# Patient Record
Sex: Male | Born: 1940 | Race: White | Hispanic: No | Marital: Married | Smoking: Former smoker
Health system: Southern US, Community
[De-identification: ages and names within clinical notes are randomized; demographics above are authoritative.]

## PROBLEM LIST (undated history)

## (undated) DIAGNOSIS — D696 Thrombocytopenia, unspecified: Secondary | ICD-10-CM

## (undated) DIAGNOSIS — E785 Hyperlipidemia, unspecified: Secondary | ICD-10-CM

## (undated) DIAGNOSIS — G500 Trigeminal neuralgia: Secondary | ICD-10-CM

## (undated) DIAGNOSIS — N183 Chronic kidney disease, stage 3 unspecified: Secondary | ICD-10-CM

## (undated) DIAGNOSIS — I480 Paroxysmal atrial fibrillation: Secondary | ICD-10-CM

## (undated) DIAGNOSIS — I739 Peripheral vascular disease, unspecified: Secondary | ICD-10-CM

## (undated) DIAGNOSIS — I428 Other cardiomyopathies: Secondary | ICD-10-CM

## (undated) DIAGNOSIS — I2510 Atherosclerotic heart disease of native coronary artery without angina pectoris: Secondary | ICD-10-CM

## (undated) DIAGNOSIS — I5042 Chronic combined systolic (congestive) and diastolic (congestive) heart failure: Secondary | ICD-10-CM

## (undated) DIAGNOSIS — G518 Other disorders of facial nerve: Secondary | ICD-10-CM

## (undated) DIAGNOSIS — K219 Gastro-esophageal reflux disease without esophagitis: Secondary | ICD-10-CM

## (undated) DIAGNOSIS — I10 Essential (primary) hypertension: Secondary | ICD-10-CM

## (undated) DIAGNOSIS — N179 Acute kidney failure, unspecified: Secondary | ICD-10-CM

## (undated) DIAGNOSIS — I493 Ventricular premature depolarization: Secondary | ICD-10-CM

## (undated) HISTORY — DX: Thrombocytopenia, unspecified: D69.6

## (undated) HISTORY — DX: Atherosclerotic heart disease of native coronary artery without angina pectoris: I25.10

## (undated) HISTORY — DX: Trigeminal neuralgia: G50.0

## (undated) HISTORY — DX: Hyperlipidemia, unspecified: E78.5

## (undated) HISTORY — DX: Ventricular premature depolarization: I49.3

## (undated) HISTORY — DX: Paroxysmal atrial fibrillation: I48.0

## (undated) HISTORY — DX: Gastro-esophageal reflux disease without esophagitis: K21.9

## (undated) HISTORY — PX: CARDIAC CATHETERIZATION: SHX172

## (undated) HISTORY — DX: Essential (primary) hypertension: I10

## (undated) HISTORY — DX: Chronic combined systolic (congestive) and diastolic (congestive) heart failure: I50.42

## (undated) HISTORY — DX: Peripheral vascular disease, unspecified: I73.9

---

## 2000-01-08 ENCOUNTER — Emergency Department (HOSPITAL_COMMUNITY): Admission: EM | Admit: 2000-01-08 | Discharge: 2000-01-08 | Payer: Self-pay | Admitting: *Deleted

## 2000-02-22 ENCOUNTER — Encounter (INDEPENDENT_AMBULATORY_CARE_PROVIDER_SITE_OTHER): Payer: Self-pay | Admitting: *Deleted

## 2000-02-22 ENCOUNTER — Ambulatory Visit (HOSPITAL_COMMUNITY): Admission: RE | Admit: 2000-02-22 | Discharge: 2000-02-22 | Payer: Self-pay | Admitting: Gastroenterology

## 2000-10-17 ENCOUNTER — Emergency Department (HOSPITAL_COMMUNITY): Admission: EM | Admit: 2000-10-17 | Discharge: 2000-10-17 | Payer: Self-pay | Admitting: *Deleted

## 2000-11-05 ENCOUNTER — Encounter: Payer: Self-pay | Admitting: Cardiology

## 2000-11-05 ENCOUNTER — Ambulatory Visit (HOSPITAL_COMMUNITY): Admission: RE | Admit: 2000-11-05 | Discharge: 2000-11-05 | Payer: Self-pay | Admitting: Cardiology

## 2001-05-07 ENCOUNTER — Ambulatory Visit (HOSPITAL_COMMUNITY): Admission: RE | Admit: 2001-05-07 | Discharge: 2001-05-07 | Payer: Self-pay | Admitting: Gastroenterology

## 2001-05-07 ENCOUNTER — Encounter: Payer: Self-pay | Admitting: Gastroenterology

## 2002-12-29 ENCOUNTER — Observation Stay (HOSPITAL_COMMUNITY): Admission: RE | Admit: 2002-12-29 | Discharge: 2002-12-30 | Payer: Self-pay | Admitting: Cardiology

## 2002-12-29 ENCOUNTER — Encounter: Payer: Self-pay | Admitting: Cardiology

## 2003-03-10 ENCOUNTER — Encounter: Payer: Self-pay | Admitting: Internal Medicine

## 2003-03-10 ENCOUNTER — Ambulatory Visit (HOSPITAL_COMMUNITY): Admission: RE | Admit: 2003-03-10 | Discharge: 2003-03-10 | Payer: Self-pay | Admitting: Internal Medicine

## 2003-06-10 ENCOUNTER — Ambulatory Visit (HOSPITAL_COMMUNITY): Admission: RE | Admit: 2003-06-10 | Discharge: 2003-06-10 | Payer: Self-pay | Admitting: Gastroenterology

## 2004-08-17 ENCOUNTER — Ambulatory Visit: Payer: Self-pay | Admitting: Cardiology

## 2004-09-04 ENCOUNTER — Ambulatory Visit: Payer: Self-pay

## 2004-09-20 ENCOUNTER — Encounter: Payer: Self-pay | Admitting: Cardiology

## 2004-09-20 ENCOUNTER — Ambulatory Visit: Payer: Self-pay

## 2005-08-15 ENCOUNTER — Ambulatory Visit: Payer: Self-pay | Admitting: Cardiology

## 2005-08-16 ENCOUNTER — Ambulatory Visit: Payer: Self-pay | Admitting: Cardiology

## 2005-08-16 ENCOUNTER — Encounter: Payer: Self-pay | Admitting: Cardiology

## 2005-10-17 ENCOUNTER — Emergency Department (HOSPITAL_COMMUNITY): Admission: EM | Admit: 2005-10-17 | Discharge: 2005-10-18 | Payer: Self-pay | Admitting: Emergency Medicine

## 2006-08-07 ENCOUNTER — Ambulatory Visit: Payer: Self-pay | Admitting: Cardiology

## 2007-08-04 ENCOUNTER — Ambulatory Visit: Payer: Self-pay | Admitting: Cardiology

## 2007-08-12 ENCOUNTER — Encounter: Payer: Self-pay | Admitting: Cardiology

## 2007-08-12 ENCOUNTER — Ambulatory Visit: Payer: Self-pay

## 2007-08-25 ENCOUNTER — Ambulatory Visit: Payer: Self-pay | Admitting: Cardiology

## 2007-08-25 ENCOUNTER — Inpatient Hospital Stay (HOSPITAL_COMMUNITY): Admission: EM | Admit: 2007-08-25 | Discharge: 2007-08-31 | Payer: Self-pay | Admitting: Emergency Medicine

## 2007-09-28 ENCOUNTER — Ambulatory Visit: Payer: Self-pay | Admitting: Cardiology

## 2008-09-08 ENCOUNTER — Telehealth: Payer: Self-pay | Admitting: Cardiology

## 2008-10-11 DIAGNOSIS — E785 Hyperlipidemia, unspecified: Secondary | ICD-10-CM | POA: Insufficient documentation

## 2008-10-12 ENCOUNTER — Ambulatory Visit: Payer: Self-pay | Admitting: Cardiology

## 2008-10-20 ENCOUNTER — Encounter: Payer: Self-pay | Admitting: Cardiology

## 2008-10-20 ENCOUNTER — Ambulatory Visit: Payer: Self-pay

## 2008-12-16 ENCOUNTER — Encounter: Payer: Self-pay | Admitting: Cardiology

## 2009-03-09 DIAGNOSIS — N401 Enlarged prostate with lower urinary tract symptoms: Secondary | ICD-10-CM | POA: Insufficient documentation

## 2009-03-09 DIAGNOSIS — E7800 Pure hypercholesterolemia, unspecified: Secondary | ICD-10-CM | POA: Insufficient documentation

## 2009-03-09 DIAGNOSIS — E1121 Type 2 diabetes mellitus with diabetic nephropathy: Secondary | ICD-10-CM | POA: Insufficient documentation

## 2009-05-27 HISTORY — PX: SHOULDER SURGERY: SHX246

## 2010-08-09 ENCOUNTER — Telehealth: Payer: Self-pay | Admitting: Cardiology

## 2010-08-14 NOTE — Progress Notes (Signed)
Summary: pt needs refill  Phone Note Refill Request Message from:  Patient  Refills Requested: Medication #1:  METOPROLOL TARTRATE 50 MG TABS 11/2 tab twice a day Initial call taken by: Shelda Pal,  August 09, 2010 4:46 PM    Prescriptions: METOPROLOL TARTRATE 50 MG TABS (METOPROLOL TARTRATE) 11/2 tab twice a day  #270 Tablet x 0   Entered by:   Margaretmary Bayley CMA   Authorized by:   Renella Cunas, MD, [facility]   Signed by:   Margaretmary Bayley CMA on 08/10/2010   Method used:   Electronically to        [facility] [phone] (retail)       [address]       [address]       [place], [place]  [address]       Ph: [phone]       Fax: [phone]   [place]:   [phone]

## 2010-08-16 ENCOUNTER — Encounter: Payer: Self-pay | Admitting: Cardiology

## 2010-08-29 ENCOUNTER — Encounter: Payer: Self-pay | Admitting: Cardiology

## 2010-08-29 ENCOUNTER — Ambulatory Visit (INDEPENDENT_AMBULATORY_CARE_PROVIDER_SITE_OTHER): Payer: Medicare Other | Admitting: Cardiology

## 2010-08-29 VITALS — BP 122/72 | HR 76 | Resp 18 | Ht 71.0 in | Wt 214.8 lb

## 2010-08-29 DIAGNOSIS — I2510 Atherosclerotic heart disease of native coronary artery without angina pectoris: Secondary | ICD-10-CM

## 2010-08-29 DIAGNOSIS — I428 Other cardiomyopathies: Secondary | ICD-10-CM

## 2010-08-29 NOTE — Progress Notes (Signed)
  Subjective:    Patient ID: MAIZE RECKARD, male    DOB: 07-15-1940, 70 y.o.   MRN: [id number]  HPI Mr Moreaux comes today to establish with me as his cardiologist. 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 His last echocardiogrm shoed improvement with an EF of 55% and mild dilatation. He is compliant with his meds. He denies angina, significant DOE, no orthopnea or edema.   Review of Systems  All other systems reviewed and are negative.       Objective:   Physical Exam  Constitutional: He is oriented to person, place, and time. He appears well-developed and well-nourished. No distress.  HENT:  Head: Atraumatic.  Eyes: EOM are normal. Pupils are equal, round, and reactive to light.  Neck: Normal range of motion. No JVD present. No tracheal deviation present. No thyromegaly present.       No bruits  Cardiovascular: Normal rate, regular rhythm and intact distal pulses.  Exam reveals gallop.   No murmur heard. Pulmonary/Chest: Effort normal.  Abdominal: Soft. Bowel sounds are normal. He exhibits no mass.       No bruits  Musculoskeletal: He exhibits no edema.  Neurological: He is alert and oriented to person, place, and time.  Skin: Skin is warm and dry.  Psychiatric: He has a normal mood and affect.          Assessment & Plan:

## 2010-08-29 NOTE — Assessment & Plan Note (Signed)
Nonobstructive and asx. I reviewed ACS and angina and how to respond.

## 2010-08-29 NOTE — Assessment & Plan Note (Signed)
EF has improved. Pt and wife reassured.

## 2010-08-29 NOTE — Patient Instructions (Signed)
Your physician recommends that you schedule a follow-up appointment in: 1 year with Dr. Verl Blalock

## 2010-10-09 NOTE — Discharge Summary (Signed)
NAME:  Erik [name], Erik [name] NO.:  [id number]   MEDICAL RECORD NO.:  [id number]          PATIENT TYPE:  INP   LOCATION:  5008                         FACILITY:  [facility]   PHYSICIAN:  Jari Pigg, PA       DATE OF BIRTH:  [birth year]   DATE OF ADMISSION:  08/25/2007  DATE OF DISCHARGE:  08/31/2007                               DISCHARGE SUMMARY   DISCHARGE DIAGNOSES:  1. Severely comminuted proximal humerus fracture involving the neck of      the left humerus.  2. Suspected acute prostatitis.   ADDITIONAL DISCHARGE DIAGNOSES:  1. Diabetes type 2.  2. Hypertension.  3. Congestive heart failure.  4. Benign prostatic hypertrophy.  5. Hyperlipidemia.  6. Coronary artery disease.  7. Gastroesophageal reflux disease.   Procedure was performed on August 28, 2007.  Left shoulder  hemiarthroplasty using Biomet cemented stem of 54 radius head with 22 mm  depth.   BRIEF HISTORY AND HOSPITAL COURSE:  Erik [name] is a right-hand-  dominant, 70 year old, Caucasian male with extensive past medical  history who presented to the emergency department on August 26, 2007 with  primary complaint of left shoulder pain, which was found to be due to  left proximal humerus fracture after falling off a ladder into the  gutters on the afternoon of August 26, 2007.  The patient was seen and  evaluated in the emergency department by the Orthopedic Trauma Service,  where closed reduction was attempted.  However, given the significant  amount of displacement and the fracture pattern, it was decided that  operative intervention was necessary to achieve anatomic reduction.  However, based on CT scan that was later obtained, demonstrated that  there was a severe comminution of the left proximal humerus, which  extended into the anatomic neck.  Therefore, it was decided that it  would be in the patient's best interest to proceed with a  hemiarthroplasty of his left shoulder.  The patient was taken to  the  operating room on August 26, 2007, where he underwent a left shoulder  hemiarthroplasty.  The patient tolerated the procedure extremely well  and was brought to the PACU without any complications.  The patient was  then transferred up to the floor where his hospital stay was 3 days at  length.   Given the patient's extensive medical history, we felt it necessary to  obtain consults from his primary care doctor who is Dr. Domenick Gong  and Dr. Vanna Scotland. Olevia Perches who is Erik [name]'s cardiologist.  With this in  mind, we delineated surgery until we achieved perfect clearance from  these 2 physicians.  The patient received clearance, and we brought the  patient to the operating room on August 28, 2007.  As noted above, the  patient tolerated the procedure well and during his postop day #2, he  did experience slight increase in his BUN and creatinine.  However, this  minor elevation was determined that it was not significant, and the  patient was observed with encouragement of fluids and monitoring of his  urine output.  The  patient also had what appeared to be either a urinary  tract infection or possible prostatitis.  Before this, the patient was  given a prescription for ciprofloxacin 250 mg p.o. b.i.d.  On  postoperative day #3, the patient was deemed stable both from a pain  perspective and vital signs in addition to negative urinary analysis and  was discharged to home.   PHYSICAL EXAMINATION:  The patient is doing extremely well and would  like to go home.  He has no new complaints at this time.  Pain is well-  controlled.  The patient denies any chest pain or shortness of breath.  No nausea or vomiting or diarrhea.  No abdominal pain.  No dizziness or  lightheadedness is noted.  VITALS:  97.8, pulse 66, respirations 18, BP 125/69, O2 sat 98% room  air.  LUNGS:  CTA bilaterally.  CARDIAC:  Regular rate and rhythm.  ABDOMEN: Soft, nontender, and nondistended with positive bowel  sounds.  EXTREMITIES: Left upper extremity dressing is clean, dry and intact.  Distal sensory function along the radial, median, ulnar and axillary  nerves is intact to light touch.  Motor function is intact along the  radial, ulnar, and median nerves.  PIN and AIN motor function is also  intact.  There is minimal tenderness with palpation of the left upper  extremity.  It is soft to palpation.  There is 2+ radial pulse.  Distal  skin is appropriate in color and temperature.   Discharge CBC, white blood cells 7.4, hemoglobin 8.4, hematocrit 24.5,  platelets 209.  Basic metabolic panel on August 30, 2007, sodium 134,  potassium 4.7, chloride 98, CO2 27, glucose 173, BUN 33, creatinine 2.2,  calcium 8.1.  Urine culture finalized on August 30, 2007 demonstrated no  growth.  In general, the patient has no acute distress.  He is resting  comfortably, and he is eager to go home.   ADDITIONAL LABS:  Postoperative x-ray demonstrated a located left  shoulder hemiarthroplasty.   DISCHARGE MEDICATIONS:  As follows.  1. Percocet 5/325 one to two every 4-6 hours as needed for pain.  2. Oxycodone 15 mg one-half tab every 6 hours for breakthrough pain in      between Percocet doses.  3. Robaxin 500 mg 1-2 every 6 hours.  4. Flomax 0.4 mg p.o. daily.  5. Lovenox 40 mg one subcu injection daily x14 days.  6. Ciprofloxacin 250 mg p.o. b.i.d. x14 days.   The patient may resume home medications as follows.  1. Nexium 40 mg p.o. daily.  2. Enalapril 5 mg p.o. b.i.d.  3. Imipramine 50 mg p.o. at bedtime.  4. Vitamin E 400 mg p.o. daily.  5. Folic acid A999333 mcg p.o. daily.  6. Metoprolol 50 mg one and half tablet p.o. b.i.d.  7. Metformin 850 mg p.o. daily.  8. Glimepiride 2 mg p.o. daily.  9. Pioglitazone 15 mg p.o. daily.  10.Crestor 20 mg p.o. daily.  11.HCTZ 25 mg daily.  12.The patient may resume aspirin 81 mg p.o. daily.   DISCHARGE INSTRUCTIONS:  As follows.  Erik [name] will be   nonweightbearing on his left upper extremity.  However, he may begin  gentle passive motion of the shoulder as well as tendons at this point  in time as pain allows.  He is to wear sling for comfort.  He is  permitted to do unrestricted range of motion of his left elbow, wrist  and hand as this may help control some  of the swelling.  It is  imperative that they perform daily dressing changes.  Once the wound is  completely dry, he may shower and wash with soap and water only.  The  patient has been advised not to apply any ointments to the wound such as  hydrogen peroxide, rubbing alcohol, iodine, or any antibiotic ointments.  Erik [name] will avoid active motion for the next 6-8 weeks, but as  described above, may begin gentle passive motion with the assistance of  therapy.  Erik [name] will also be placed on Lovenox for DVT prophylaxis  for the next 14 days given his extensive cardiac history.  Even though  this was not a lower extremity injury, his past history does warrant  some DVT prophylaxis.  Therefore, we feel that 14 days of Lovenox  therapy is sufficient for this purpose.  Erik [name] can be as active as  he can tolerate within pain limit and with the restrictions I have  placed on his left upper extremity.  Erik [name] will follow up with Dr.  Marcelino Scot in 10-14 days at which time we will obtain x-rays of his left  shoulder to evaluate the placement of the prosthesis and to assess  healing.  The patient will also follow up with Dr. Olevia Perches on Sep 28, 2007  and will also follow up with Dr. Osborne Casco in 2 weeks.      Jari Pigg, PA     KWP/MEDQ  D:  09/27/2007  T:  09/28/2007  Job:  [id number]

## 2010-10-09 NOTE — Assessment & Plan Note (Signed)
[facility]                            CARDIOLOGY OFFICE NOTE   TILFORD, MOLLINEDO                      MRN:          [id number]  DATE:08/04/2007                            DOB:          [birth year]    PRIMARY CARE PHYSICIAN:  [name], M.D.   CLINICAL HISTORY:  Mr. Zuo is 70 years old and is now retired from  Architectural technologist at [employer].  He returns for followup  management of his nonischemic cardiomyopathy and PVCs.  He had a  catheterization which showed nonobstructive coronary disease and his  ejection fraction has been documented in the range of 30-35% although it  had improved to 40-45% on echo a year ago.  He says he has been doing  quite well and has had no recent chest pain, shortness of breath or  palpitations.   PAST MEDICAL HISTORY:  Significant for hypertension, diabetes, GERD and  hyperlipidemia.   CURRENT MEDICATIONS:  1. Include Nexium.  2. Glimepiride.  3. Hydrochlorothiazide 25 mg daily.  4. Enalapril 5 mg b.i.d.  5. Imipramine.  6. Aspirin 81 mg daily.  7. Vitamin E.  8. Folic acid.  9. CoQ10.  10.Metformin p.o.  11.Pioglitazone.  12.Metoprolol 50 mg 1/2 tablet b.i.d.  13.Crestor 20 mg daily.   PHYSICAL EXAMINATION:  VITAL SIGNS:  On examination the blood pressure  is 120/74 and the pulse 74 and regular.  NECK:  There was no venous distention.  The carotid pulses were full  without bruits.  CHEST:  Was clear.  HEART:  His heart rhythm was regular.  I could hear no murmurs or  gallops.  ABDOMEN:  Soft without organomegaly.  EXTREMITIES:  Peripheral pulses were full and there was no peripheral  edema.   Electrocardiogram showed sinus rhythm with one PVC and was otherwise  normal.   IMPRESSION:  1. 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  2. Nonobstructive coronary disease.  3. Hypertension.  4. Diabetes.  5. Gastroesophageal reflux disease.  6. Premature  ventricular contractions.  7. Hyperlipidemia.   RECOMMENDATIONS:  I think Erik [name] is doing quite well.  We will  continue his current medication list which includes a beta-blocker and  ACE inhibitor for his LV dysfunction.  We will get a repeat  echocardiogram and we will see him back in followup in a year.     Bruce Alfonso Patten Olevia Perches, MD, [facility]  Electronically Signed    BRB/MedQ  DD: 08/04/2007  DT: 08/05/2007  Job #: [id number]

## 2010-10-09 NOTE — Op Note (Signed)
NAME:  Erik [name], DAY NO.:  [id number]   MEDICAL RECORD NO.:  [id number]          PATIENT TYPE:  INP   LOCATION:  5008                         FACILITY:  [facility]   PHYSICIAN:  Astrid Divine. Marcelino Scot, M.D. DATE OF BIRTH:  12/01/1940   DATE OF PROCEDURE:  08/28/2007  DATE OF DISCHARGE:                               OPERATIVE REPORT   PREOPERATIVE DIAGNOSIS:  Severely comminuted proximal humerus fracture  involving the anatomic neck, left.   POSTOPERATIVE DIAGNOSIS:  Severely comminuted proximal humerus fracture  involving the anatomic neck, left.   PROCEDURE:  Left shoulder hemiarthroplasty using a Biomet cemented 8  stem, 54 radius head with a 22 mm depth.   SURGEON:  Astrid Divine. Marcelino Scot, M.D.   ASSISTANT:  Jari Pigg, PA   ANESTHESIA:  General.   COMPLICATIONS:  None.   ESTIMATED BLOOD LOSS:  200 mL.   IV FLUIDS:  2000 crystalloid, 500 Hespan.   DISPOSITION:  To PACU.   CONDITION:  Stable.   BRIEF SUMMARY/INDICATIONS FOR PROCEDURE:  Mr. Phommachanh is a right-hand  dominant 70 year old male diabetic with cardiac problems as well.  He  sustained a severely comminuted left proximal humerus fracture with  involvement of the anatomic neck as confirmed on CT scan.  We discussed  with him preoperatively the risks and benefits of surgery; including the  possibility of infection, nerve injury, vessel injury, DVT, PE, heart  attack, stroke, and others.  After full discussion he wished to proceed.   BRIEF SUMMARY OF PROCEDURE:  Mr. Olund was taken to the operating  room, after administration of preoperative antibiotics, general  anesthesia was induced.  Left upper extremity was prepped and draped in  the usual sterile fashion.  We made a standard deltopectoral approach,  identified the cephalic vein, retracted it laterally with the deltoid,  and developed the deltopectoral interval.  We incised the clavipectoral  fascia.  There was considerable hematoma.  There  appeared to be a  massive injury to the deltoid, and also some of the pec which was  contused and part of the pec insertion was disrupted as well.  The  muscle was nonetheless contractile on the side of the deltoid.  We  identified the biceps tendon using osteotome to complete the lesser  tuberosity segment.  We did leave the rotator cuff attached to this.   Similarly we used #2 FiberWire to grasp both the greater tuberosity and  lesser tuberosity pieces along with their respective rotator cuff  insertions.  As his bone was of good quality, we used a drill to assist  with passage of the suture needles.  We used a rongeur to hollow this  area out.  We then examined the glenoid and there was some grade 3 wear,  but no areas of grade 4 eburnation of the bone.  We also debrided a  degenerative tear of the labrum.  There was some fraying along the  labrum inferiorly, but none of these segments required debridement as  they did not displace into the joint.  We removed all the head fragments  including the anatomic  neck which was measured to the dimensions stated  above.   We then prepared the humeral shaft using the reamers, then the broach  for placement of the plastic cuff.  This was inserted and secured into  place.  We then trialed the prosthesis for height.  This was followed by  placement of the actual prosthesis after using standard third-generation  cement technique along with the cement restricter, and pressurization.  We placed the head in approximately 30 degrees of retroversion.  The  excess cement was removed.  We irrigated thoroughly.  We repaired the  tuberosities back through bone tunnels placed into the proximal radial  shaft as well as through the prosthesis.  The tuberosities were also  tied to each other, and through the prosthesis as well as, such that  they moved as a unit.  As we had large pieces of tuberosity, we did not  need additional grafting, though we had  prepared to do so.  We had  excellent bone-to-bone contact.  Wounds were copiously irrigated and  closed in a standard layered fashion using #0 Vicryl to reapproximate  the deltopectoral interval, and 2-0 Vicryl for the subcu, and staples  for the skin.  A sterile gently compressive dressing was then applied  and then a sling.  The patient was awakened from anesthesia and  transported back to PACU, in stable condition, after placement of a  Foley catheter.   PROGNOSIS:  Mr. Billadeau has had a severe injury to his left shoulder,  proximal humeral replacement should facilitate pain control and limited  function for every day activities.  He can begin a gentle passive motion  of the shoulder as well as pendulum, but will avoid active motion for 6-  8 weeks.      Astrid Divine. Marcelino Scot, M.D.  Electronically Signed     MHH/MEDQ  D:  08/28/2007  T:  08/28/2007  Job:  [id number]

## 2010-10-09 NOTE — Assessment & Plan Note (Signed)
[facility]                            CARDIOLOGY OFFICE NOTE   JOREY, JAHODA                      MRN:          [id number]  DATE:09/28/2007                            DOB:          1941-04-04    PRIMARY CARE PHYSICIAN:  [name], M.D.   CLINICAL HISTORY:  Mr. Kubik is 70 years old and returned for a follow-  up visit early after his recent hospitalization following a fall.  He  was working on his house and fell and had a severe dislocation and  fraction of his left shoulder and humorous.  He required surgery for  this and is currently in rehab.   He has a nonischemic cardiomyopathy with ejection fraction of 35-45% by  recent echocardiography.  He has a history of PVCs and nonobstructive  coronary disease at catheterization.   He says he has done well from the standpoint of his heart with no recent  chest pain, shortness of breath or palpitations.   PAST MEDICAL HISTORY:  Significant for  1. Hypertension.  2. Diabetes  3. Hyperlipidemia.   CURRENT MEDICATIONS:  1. Metformin/pioglitazone.  2. Metoprolol 50 mg 1-1/2 tablet b.i.d.  3. Crestor 20 mg daily.  4. Aspirin 81 mg daily.  5. Imipramine 50 mg daily.  6. Enalapril 5 mg b.i.d.  7. Hydrochlorothiazide 25 mg daily.  8. Glimepiride.  9. Nexium.   PHYSICAL EXAMINATION:  VITAL SIGNS:  Blood pressure 128/70, pulse 68 and  regular.  NECK:  There was no venous distention.  The carotid pulses were full  without bruits.  CHEST:  Clear without rales or rhonchi.  CARDIAC:  Rhythm was regular.  Heart sounds were normal and there were  no murmurs or gallops.  ABDOMEN:  Soft with normal bowel sounds.  EXTREMITIES:  Peripheral pulses were full, no peripheral edema.   IMPRESSION:  1. Recent fall with dislocation and fracture of the left shoulder.  2. Nonischemic cardiomyopathy, ejection fraction of 35-45%.  3. PVCs.  4. Nonobstructive coronary disease.  5. Hyperlipidemia.  6. Hypertension.  7. Diabetes  8. Gastroesophageal reflux disease.   RECOMMENDATIONS:  I think Mr. Terhaar is doing well from the standpoint  of his heart.  He is currently in rehab for his shoulder.  We will plan  to see him back in follow-up in a year.     Bruce Alfonso Patten Olevia Perches, MD, [facility]  Electronically Signed    BRB/MedQ  DD: 09/28/2007  DT: 09/28/2007  Job #: [id number]

## 2010-10-09 NOTE — Consult Note (Signed)
NAME:  Erik [name], Erik [name] NO.:  [id number]   MEDICAL RECORD NO.:  [id number]          PATIENT TYPE:  INP   LOCATION:  5008                         FACILITY:  [place]   PHYSICIAN:  [name], M.D.DATE OF BIRTH:  06/08/1940   DATE OF CONSULTATION:  08/26/2007  DATE OF DISCHARGE:                                 CONSULTATION   CHIEF COMPLAINT:  Status post fall off ladder with left humeral  fracture, medical clearance/diabetes management/elevated potassium and  creatinine to be addressed.   HISTORY OF PRESENT ILLNESS:  The patient is a 70 year old, white male,  who is well-known to me from a history of well-controlled diabetes with  a last A1c of 6.4, hypertension, mild congestive heart failure with an  EF of 35 to 45% on last set of imaging, BPH, hyperlipidemia, and reflux  disease, who was working on patching some of his gutters in the past  day.  He evidently fell off his ladder and had a considerable fall and  was taken by EMS and was admitted by Dr. Marcelino Scot after having trauma.  He  had his left shoulder and arm reset as he had a displacement and  requires an open procedure to be done in the OR tomorrow.  Of note, at  the time of his admission, his creatinine was 1.6.  Our old records  indicate that that is usually where he is at.  His creatinine has risen  to 1.8 today, and his potassium was noted as being elevated as well.  The patient, other than having fallen, feels to be in his normal state  of health.  He has missed a couple of his doses of medications because  of the hospitalization, but otherwise indicates that he was in his  normal state of doing things prior to his fall.   ALLERGIES:  The patient has had problems with ANTIHISTAMINES and urinary  retention.   MEDICATIONS:  1. Hydrochlorothiazide 25 mg p.o. daily.  2. Aspirin 81 mg per day.  3. Nexium 40 mg daily.  4. Enalapril 5 mg b.i.d.  5. Toprol-XL 50 mg p.o. b.i.d.  6. Imipramine 150 mg  p.o. q.h.s.  7. Amaryl 4 mg p.o. daily.  8. Crestor 20 mg p.o. daily.  9. Actoplus Met 15/850 twice daily.   PAST MEDICAL HISTORY:  1. Diabetes mellitus, type 2, with a history of microalbuminuria.  He      has been well-controlled with an A1c of 6.4 his last visit.  2. Hypertension.  3. Congestive heart failure with an overestimated low ejection      fraction most recently at 35 to 45% and is mild, he has not shown      outward symptoms at this point.  4. BPH with INTOLERANCE TO ANTIHISTAMINES CAUSING OBSTRUCTION.  5. Hyperlipidemia.  6. Coronary artery disease.  7. Gastroesophageal reflux disease.   SOCIAL HISTORY:  The patient lives in [place] with his wife, Parke Simmers,  who is also my patient.  He is married with three children.  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   FAMILY HISTORY:  Mother died at age 64 of diabetes and heart disease,  father died at age 8 of heart disease, he has a brother, who died of  heart disease, and another brother, who is alive being diabetic.   REVIEW OF SYSTEMS:  The patient denies any fevers, chills, sweats, any  headaches, indicates that he was in his normal state of health prior to  falling, and is otherwise negative except for myalgias, arthralgias,  swelling in his left shoulder, and some stitches and bruising around his  chin.  A 10-point review is otherwise negative.   ADVANCED DIRECTIVES:  Patient is a Full Code.   PHYSICAL EXAMINATION:  VITAL SIGNS:  Temperature 97.6, pulse 96,  respiratory rate 20, blood pressure 110/61, saturation 95% on room air.  GENERAL:  The patient is traumat status post fall and appears to have  suffered a trauma but is in no acute distress presently.  HEENT:  Normocephalic, extraocular movements intact, TM's are clear, he  has a laceration of his chin, which has been sutured, he has some  bruising below his chin.  NECK:  Supple, no lymphadenopathy.   HEART:  Regular rate and rhythm, no murmur, rub, or gallop.  LUNGS:  Clear to auscultation bilaterally.  ABDOMEN:  Soft and nontender with normoactive bowel sounds.  EXTREMITIES:  The patient has swelling in his left shoulder with  considerable bruising, he has decreased range of motion consistent with  his fracture.  MUSCULOSKELETAL:  As noted above.  NEURO:  The patient is oriented to person, place, and time.  He has  normal sensation in both his upper and lower extremities and 1+ deep  tendon reflexes bilaterally.   LABORATORY DATA:  His white count is slightly elevated at 11.1,  hemoglobin 14.2, his hematocrit 40.9, platelets 171, BUN and creatinine  are slightly elevated at 23 and 1.8 respectively, his glucose is 229  with a potassium of 5.5, which is slightly elevated.  Coags are  negative.  Urinalysis showed an elevated glucose, pH of 5, and a  specific gravity of 1.023.   ASSESSMENT AND PLAN:  1. Chronic kidney disease:  The patient has been assessed in the past      with a 24-hour creatinine clearance indicating that for a person      his size his clearance is approximately 40 to 50 at his baseline.      This is why he is able to maintain Metformin without any      difficulty.  We have looked into this in the past and preferred to      use medications that do not cause hypoglycemia as he has been well      controlled.  Given the rise in his creatinine, we are, however,      going to take him off of his Metformin and continue to follow him      with sliding scale.  His Amaryl will be held pending tomorrow's      surgery as he will be NPO, and we will expand his access to 15 mg      twice daily given that he has a very mild decrease in ejection      fraction and there should not be any difficulty with congestive      heart failure.  2. Hyperkalemia:  This is most likely elevated secondary to his      glucoses being elevated as well.  As  glucose comes down, potassium       comes down as well, and I do not feel that this is a feature of      renal insufficiency.  If his potassium rises further overnight, we      will consider taking him off the Enalapril.  However, I also find      him to be somewhat dehydrated and will start him on normal saline      at 75 ml an hour as he has not been eating or drinking particularly      well due to his pain.  3. Diabetes mellitus, type 2:  Relatively well-controlled, we will      hold his Glucophage while he is hospitalized and consider      restarting it as his combination pill as an outpatient.  We will      decrease his Glimepiride to hold status until he is able to eat      again starting tomorrow and continue to follow him with a sliding      scale.  4. Patient has been cardiac cleared by Dr. Ron Parker, who is Dr. Nichola Sizer      partner with [facility] where the patient has followed up      in the past.  He does not require any further testing and will be      maintained on his other      blood pressure meds other than his hydrochlorothiazide for now, and      his Crestor with the Laboratory equivalent for hospital protocol.   Thank you for allowing me to consult on this patient.      [name], M.D.  Electronically Signed     RWT/MEDQ  D:  08/26/2007  T:  08/26/2007  Job:  [id number]

## 2010-10-09 NOTE — Consult Note (Signed)
NAME:  BRAN, LYU NO.:  [id number]   MEDICAL RECORD NO.:  [id number]          PATIENT TYPE:  INP   LOCATION:  5008                         FACILITY:  [place] [facility]   PHYSICIAN:  Carlena Bjornstad, MD, FACCDATE OF BIRTH:  1941/04/01   DATE OF CONSULTATION:  DATE OF DISCHARGE:                                 CONSULTATION   HISTORY:  [name] unfortunately fell off a ladder with a significant  injury to his left shoulder and he needs surgery.  We are seeing him for  cardiac clearance.  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  He has a nonischemic cardiomyopathy.  In the past, his ejection  fraction was thought to be 30-35% and then possibly more recently in the  40-45% range.  Most recent echo on August 13, 2007 suggests an EF from 35-  45%.  He is not having chest pain.  He is not having any significant  shortness of breath.  He did not have syncope.  We know that  catheterization in the past showed only very minimal coronary disease.   ALLERGIES:  NO KNOWN DRUG ALLERGIES.   MEDICATIONS:  Prior to admission he was on:  1. Nexium.  2. Glimepiride.  3. Hydrochlorothiazide.  4. Enalapril.  5. Imipramine.  6. Aspirin.  7. Metformin.  8. Metoprolol.  9. Crestor.   OTHER MEDICAL PROBLEMS:  See the list below.   SOCIAL HISTORY:  The patient does not smoke.  He is retired from  [employer].   FAMILY HISTORY:  Noncontributory to the current situation.   REVIEW OF SYSTEMS:  He has some discomfort from his shoulder today.  He  is not having any headache or eye problems.  He is not having any GI or  GU symptoms.  Other than his areas of injury, he has no musculoskeletal  complaints.  Overall his review of systems otherwise is negative.   PHYSICAL EXAMINATION:  GENERAL:  The patient is oriented to person, time  and place.  Affect is normal.  He has family in the room at the time of  evaluation.  VITAL SIGNS:  At this time his blood pressure is  99/57.  His pulse rate  is 80.  Respirations are 18.  HEENT:  Reveals no xanthelasma.  He has normal extraocular motion.  He  does have sutures on his chin.  LUNGS:  Clear.  Respiratory effort is not labored.  His left arm is in a  sling.  CARDIAC:  Reveals S1-S2.  There are no clicks or significant murmurs.  ABDOMEN:  Soft.  He has normal bowel sounds.  EXTREMITIES:  There is no peripheral edema.   DIAGNOSTICS:  1. There is no EKG yet and this will be ordered.  2. Chest x-ray revealed that his heart size was normal.  There was no      pulmonary abnormality.   LABORATORY DATA:  Hemoglobin is 14.2, BUN is 23 with a creatinine of 1.6  going to 1.8, potassium is 5.5.   IMPRESSION:  1. History of diabetes.  2. Hypercholesterolemia.  3.  Very minimal coronary disease.  4. Creatinine of 1.9.  This will have to be followed carefully.  5. Potassium of 5.5.  If he is getting any extra potassium, I will      stop it.  6. History of nonischemic cardiomyopathy.  Ejection fraction currently      is in the 35-45% range.   PLAN:  The patient is on the appropriate medications for now.  There is  no reason to make any significant changes.  It will be important to  follow his volume status to be sure he is not significantly volume  overloaded with any of his procedures.  The patient is cleared for his  orthopedic surgery.  We will follow his cardiac status.      Carlena Bjornstad, MD, [facility]  Electronically Signed     JDK/MEDQ  D:  08/26/2007  T:  08/26/2007  Job:  [id number]   cc:   [name], M.D.  Bruce Alfonso Patten Olevia Perches, MD, [facility]

## 2010-10-09 NOTE — Consult Note (Signed)
NAME:  Erik, [name] NO.:  [id number]   MEDICAL RECORD NO.:  [id number]          PATIENT TYPE:  INP   LOCATION:  5008                         FACILITY:  [place] [facility]   PHYSICIAN:  Astrid Divine. Marcelino Scot, M.D. DATE OF BIRTH:  08/04/1940   DATE OF CONSULTATION:  08/26/2007  DATE OF DISCHARGE:                                 CONSULTATION   REFERRING PHYSICIAN:  Donzetta Matters, MD   REASON FOR CONSULTATION:  Left proximal humerus fracture.   HISTORY OF PRESENT ILLNESS:  [name] is a 70 year old Caucasian male  who states that earlier this afternoon he was up on a ladder cleaning  out his gutters.  The patient states that he was on the top rung of the  ladder when it twisted and gave way, causing the patient to fall to the  ground below.  The patient states that he was approximately six feet up  in the air and landed on his left side, resulting in injury to his left  upper extremity.  Mr. Shindle had the immediate onset of pain and  inability to move his left arm.  The patient was taken to the emergency  department for evaluation.  The patient was placed in a C-spine  precaution due to fall from a height.   Currently Mr. Nessler does complain of significant amounts of pain in  his left upper extremity.  He does not have any numbness or tingling in  his left upper extremity.  He does not complain of any shortness of  breath, chest pain, abdominal pain, fevers, chills, nausea or vomiting.  The patient has been well prior to his fall.  Mr. Sandau states that he  is able to move his left hand, but it is painful to do so.  The patient  complains of the most significant amounts of pain between his elbow and  his left shoulder.  Again, he does not have any numbness or tingling at  this point in time in the left upper extremity.   REVIEW OF SYSTEMS:  As noted above in the HPI.   PAST MEDICAL HISTORY:  1. Hypertension.  2. Diabetes mellitus type 2.  3. Gastroesophageal reflux  disease.  4. Hyperlipidemia.  5. A recent cardiology visit on August 04, 2007, for followup with      regards to his nonischemic cardiomyopathy and PVCs.  He has had a      previous cardiac catheterization which showed nonobstructive      coronary artery disease and has had an echocardiogram which has      documented an ejection fraction of 40%-45%.   FAMILY HISTORY:  Noncontributory.   ALLERGIES:  No known drug allergies.   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  He denies alcohol use.  Denies tobacco use.  He lives  with his wife.   CURRENT MEDICATIONS:  1. Nexium.  2. Hydrochlorothiazide.  3. Enalapril.  4. Glimepiride.  5. Imipramine.  6. Aspirin 81 mg daily.  7. Metoprolol.  8. Crestor.  9. Glitazone p.o.[name]  10.Metformin.  [id number].  12.Folic acid.  13.Vitamin E.   PHYSICAL EXAMINATION:  VITAL SIGNS:  Temperature 97.8 degrees, pulse 83,  respirations 16, blood pressure 135/65.  GENERAL:  The patient is a well-developed and well-nourished Caucasian  male who appears uncomfortable lying on a stretcher in the emergency  room.  He is very coherent and conversive as well.  HEENT:  Normocephalic.  There is a recently repaired chin laceration.  Pupils equal, round, reactive to light and accommodation.  Moist mucous  membranes.  NECK:  Supple, no lymphadenopathy.  The C-collar is on.  The patient is  awaiting to have his C-spine cleared.  CHEST:  Clear to auscultation bilaterally.  HEART:  A regular rate and rhythm.  S1 and S2.  ABDOMEN:  Soft, nontender, nondistended with positive bowel sounds.  PELVIS:  No gross instability noted.  GENITOURINARY:  Not indicated for this patient and has been deferred.  EXTREMITIES/NEUROLOGIC:  Right upper extremity, left and right lower  extremities are atraumatic.  Joints are nontender to palpation.  Demonstrate a sufficient range of motion.  No crepitus or rock motions  noted  with movement of the joints into range of motion.  Sensation of  the right upper extremity along the radial, median, ulnar and axillary  nerves is intact.  Distal motor function of the radial, median, ulnar  nerves is intact.  Motor function of the posterior interosseous and  anterior interosseous is relatively intact.  He has 2+ radial pulses  also noted.   Right and left lower extremities demonstrate sufficient function on  range of motion.  Distal sensation along the deep peroneal nerve,  superficial peroneal nerve, tibial nerve and sural nerve is intact  bilaterally.  FHL, EHL, anterior tibialis, posterior tibialis, peroneal  and gastroc soleus complex also intact with regards to motor function  bilaterally.  He has 2+ dorsalis pedis pulses noted bilaterally.   Turning the attention to the left upper extremity, upon initial  inspection the left upper extremity is noted to be in a abducted  position with the shoulder at approximately 90 degrees of abduction.  The elbow was fully extended.  The upper extremity is placed in a NuForm  splint.  The patient does have significant pain with palpation of the  proximal humerus.  There is also an appreciable mass present over the  left shoulder, which may be representative of a hematoma or possible  pectoralis major avulsion.  Elbow range of motion is within functional  limits and does not demonstrate any gross instability upon testing.  The  patient demonstrates a full range of motion at the left wrist and  digits.  Sensation along the axillary nerve is intact.  Sensation along  the radial, ulnar and median nerves is intact distally.  Motor function  with regards to the radial, ulnar and median nerves is intact.  Motor  function of the anterior thoracic and posterior thoracic nerves is also  intact.  There are 2+ radial pulses noted on the left upper extremity.  There is brisk capillary refill on the distal skin of the left upper  extremity  and appropriate color.  The skin is warm to touch.   LABORATORY/X-RAY DATA:  Demonstrate a displaced left proximal humerus  fracture.   At the time of initial assessment, no laboratory work had been  completed.   IMPRESSION:  Displaced fracture of the left humeral neck with impaction.   PLAN:  At this point in time we will proceed with a closed  reduction  with conscious sedation in the emergency department, after which we will  admit Mr. Yinger for observation and pain control.  Admission is also  warranted secondary to the patient's cardiac history.  In addition,  further studies will be obtained to further evaluate this injury.  We  will obtain a posterior reduction plain film of the left shoulder to  evaluate the reduction.  In addition, a CT scan will also be obtained to  further delineate the fracture pattern and to help with possible  surgical planning, should that become necessary.  With regards to the  swelling/mass about the left shoulder, we will also observe that during  the patient's admission.  It is unlikely to be vascular in nature, given  the strong radial pulse which is 2+, and a good color and temperature of  the distal left upper extremity.  In addition, a CT scan may further  help evaluate this mass and may demonstrate it to be a hematoma or  possibly a soft tissue mass, possibly from an avulsed pectoralis tendon  from insertion of the humerus.  In addition, we will contact Dr. Vanna Scotland. Olevia Perches, who is Mr. Souffrant's cardiologist and Dr. [name],  who is the patient's primary care physician.   Please note that the patient was seen and examined with Dr. Astrid Divine.  Handy.      Jari Pigg, PA      Astrid Divine. Marcelino Scot, M.D.  Electronically Signed    KWP/MEDQ  D:  08/26/2007  T:  08/26/2007  Job:  [id number]

## 2010-10-12 NOTE — Assessment & Plan Note (Signed)
[facility]                            CARDIOLOGY OFFICE NOTE   Erik, [name]                      MRN:          [id number]  DATE:08/07/2006                            DOB:          12/03/1940    PRIMARY CARE PHYSICIAN:  [name], M.D.   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  I have been following him  with a non-ischemic cardiomyopathy and PVC's.  His ejection fraction has  been in the range of 30-35%, but we did an echo last year, and he  improved the 40-45%.  He has had a catheterization that showed  nonobstructive coronary disease.   He says he has been doing quite well.  He has had no symptoms of chest  pain, shortness of breath or palpitations.   PAST MEDICAL HISTORY:  1. Hypertension.  2. Diabetes.  3. GERD.  4. Hyperlipidemia.   Dr. Osborne Casco has been working with his diabetes, and his hemoglobin A1C  has gone from 7.5 down to 5.5 in indicated.   CURRENT MEDICATIONS:  1. Vytorin.  2. Nexium.  3. Glimepiride.  4. Hydrochlorothiazide.  5. Enalapril.  6. Imipramine.  7. Aspirin.  8. Metformin/pioglitazone.  9. Metoprolol.   PHYSICAL EXAMINATION:  GENERAL:  On examination today, the blood  pressure is 110/58, pulse 73 and regular.  NECK:  There was no venous distention.  Carotid pulses were full without  bruits.  CHEST:  Clear.  CARDIAC:  Rhythm was regular.  I could hear no murmurs or gallops.  ABDOMEN:  Soft with normal bowel sounds.  There was no  hepatosplenomegaly.  EXTREMITIES:  Peripheral pulses were full, and there was no peripheral  edema.   ELECTROCARDIOGRAM:  An electrocardiogram showed sinus rhythm with  occasional PVC's.   IMPRESSION:  1. Nonischemic cardiomyopathy with ejection fraction of 40-45%      (improved).  2. Nonobstructive coronary disease.  3. Hypertension.  4. Diabetes.  5. Gastroesophageal reflux disease.  6. Premature ventricular contractions.  7. Hyperlipidemia.   RECOMMENDATIONS:  I think Erik [name] is doing well from a standpoint of  his heart.  We will not change his medications.  Will plan to see him  back in a year and will get an echocardiogram prior to that visit.     Bruce Alfonso Patten Olevia Perches, MD, [facility]  Electronically Signed    BRB/MedQ  DD: 08/07/2006  DT: 08/08/2006  Job #: [id number]   cc:   [name], M.D.

## 2010-10-12 NOTE — Discharge Summary (Signed)
NAME:  Erik [name], Erik [name]                         ACCOUNT NO.:  [id number]   MEDICAL RECORD NO.:  [id number]                   [name] TYPE:  OIB   LOCATION:  2018                                 FACILITY:  [place]   PHYSICIAN:  Eustace Quail, M.D.                  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  In Erik  office he states that when he checks his blood pressure he notices his pulse  jumping around.  He has had some episodes of heart racing, however, he has  not had this in a long time.  He denies any problems with chest discomfort,  shortness of breath or syncope.  A heart catheterization in 2002 showed  minimal irregularities and his coronary arteries at that time had an  ejection fraction of approximately 31%.  His history is also notable for  hypertension and non-insulin-dependent diabetes.  Dr. Olevia Perches performed a  Ernestene Kiel on Erik [name] on December 14, 2002.  He achieved 83% of his  predicted maximum heart rate.  It was noted that he had increased PVCs  during exercise associated with some hypotension and diaphoresis.  EKG did  not show any signs of ischemia.  Ejection fraction of 31%.  An imaging  showed a partial reversible defect in Erik inferior anteroseptal walls  associated with wall motion abnormality, thus Dr. Olevia Perches arranged cardiac  catheterization.  Echocardiogram on December 02, 2002 showed an EF of 45 to 50%  with inferior anteroseptal hypokinesis, mild left ventricular hypertrophy.   HOSPITAL COURSE:  Erik [name] was brought in for outpatient cardiac  catheterization which was performed on December 29, 2002 by Dr. Olevia Perches without  difficulty.  Dr.  Olevia Perches noted that he again had some irregularities in Erik  LAD, Erik circumflex did not have any coronary artery disease in his dominant  system.  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  Anomalous take off was from Erik LAD, peak level hypokinesis with an EF of 30  to 35%.  Dr. Olevia Perches did not note any source of ischemia as to Erik cause for  his frequent PVCs.  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  Post  sheath removal and bed rest he was ambulating without difficulty.  On December 30, 2002 catheterization site was intact.  Dr. Albertine Patricia after review felt that  Erik [name] could be discharged home.  His discharge H&H was 15.1 and 42.8,  platelets 150,000, WBC 5200.  Sodium 139, potassium 4.4, BUN  15, creatinine  1.2, glucose 180.   DISCHARGE DIAGNOSES:  1. Premature ventricular contractions.  2. Positive stress Cardiolite as previously described (note - with Erik     exercise portion he had increased premature ventricular contractions with     associated hypotension and diaphoresis).  3. Dilated cardiomyopathy, continued medical treatment.  4. Non-insulin-dependent diabetes with recent initiation of Glucophage.  5. Obesity.   DISPOSITION:  Erik [name] is discharged home.  He received new prescriptions  for Toprol XL 150 mg daily for extended 24 hour control, and Lisinopril 5 mg  daily and he is asked to continue his aspirin 81 mg daily,  hydrochlorothiazide 25 mg daily, Imipramine 25 mg q.h.s., Nexium 40 mg  daily.  He is asked to resume his Glucophage 500 mg b.i.d., Lopid 600 mg  b.i.d., Kay-Ciel 20 daily, vitamin E, folic acid and vitamin B12 as  previously.   He is advised no lifting, driving, sexual activity or heavy exertion for two  days.  Maintain a low salt, low fat, cholesterol, ADA diet.  If he has any  problems with Erik catheterization site he is asked to call  immediately.  He  will  bring all medications to all appointments.  He will follow up with Dr.  Olevia Perches on January 19, 2003 at 10:45 a.m.  At Erik time of follow up Dr. Olevia Perches  will review Erik [name] and if he feels needed, have him follow up with an  EP specialist in regards to his frequent PVCs and dilated cardiomyopathy.       Sharyl Nimrod, P.A. [facility]                    Eustace Quail, M.D.    EW/MEDQ  D:  12/30/2002  T:  12/30/2002  Job:  [id number]   cc:   Heinz Knuckles. Norins, M.D. [facility]

## 2010-10-12 NOTE — Op Note (Signed)
NAME:  Erik [name], Erik [name]                         ACCOUNT NO.:  [id number]   MEDICAL RECORD NO.:  [id number]                   PATIENT TYPE:  AMB   LOCATION:  ENDO                                 [facility]   PHYSICIAN:  Jeryl Columbia, M.D.                 DATE OF BIRTH:  07-06-1940   DATE OF PROCEDURE:  06/10/2003  DATE OF DISCHARGE:                                 OPERATIVE REPORT   PROCEDURE:  Flexible sigmoidoscopy.   INDICATIONS:  Bright red blood per rectum.  Consent was signed after risks,  benefits, methods, and options thoroughly discussed multiple times in the  past.   MEDICINES USED:  None.  This was an unprepped, unsedated flexible  sigmoidoscopy.   DESCRIPTION OF PROCEDURE:  Rectal inspection was pertinent for small  external hemorrhoids.  Digital exam was negative.  The video pediatric  adjustable colonoscope was inserted to 35 cm easily.  At that point there  was formed stool seen in the distance.  No blood was seen coming from above,  and we elected to withdraw.  No abnormality was seen on insertion.  On slow  withdrawal there was a little bit of stool, which could not be washed or  suctioned off, but otherwise no blood or abnormalities were seen.  As we  slowly withdrew back to the rectum, anorectal pull-through and retroflexion  confirmed small hemorrhoids.  The scope was reinserted a short way up the  left side of the colon, air was suctioned, and the scope removed.  The  patient tolerated the procedure well, and there was no obvious immediate  complication.   ENDOSCOPIC DIAGNOSES:  1. Internal-external hemorrhoids.  2. Otherwise within normal limits to 35 cm, no blood seen, stopped secondary     to formed stool.   PLAN:  Go ahead and treat his hemorrhoids.  Call me p.r.n.  Check a CBC just  to be sure but as long as that is okay, follow up p.r.n. or in six weeks to  recheck guaiacs and make sure no further workup plans are needed.                              Jeryl Columbia, M.D.    MEM/MEDQ  D:  06/10/2003  T:  06/10/2003  Job:  [id number]   cc:   Heinz Knuckles. Norins, M.D. [facility]

## 2010-10-12 NOTE — Cardiovascular Report (Signed)
NAME:  Erik [name], Erik [name]                         ACCOUNT NO.:  [id number]   MEDICAL RECORD NO.:  [id number]                   PATIENT TYPE:  OIB   LOCATION:  2889                                 FACILITY:  [place]   PHYSICIAN:  Eustace Quail, M.D.                  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  He recently noticed his heart was skipping when he  checked his blood pressure at [employer].  We saw him in consultation and  arranged for him to have a Holter monitor which showed paired PVCs and  frequent PVCs.  He also had an echocardiogram which showed some depression  of his LV function with ejection fraction 45% and he had a Cardiolite scan  which suggested possible inferior ischemia.  For these reasons, he was  brought in for further evaluation.  He also has a history of diabetes,  hyperlipidemia, and hypertension.   PROCEDURE:  The procedure was performed via the right femoral artery  utilizing an arterial sheath and 6-French preformed coronary catheters.  A  front wall arterial puncture was performed and Omnipaque contrast was used.  We had difficulty identifying the right coronary artery so we performed an  aortic root injection.  The patient tolerated the procedure well and left  the laboratory in satisfactory condition.   RESULTS:  Left main coronary artery:  Free of significant disease.   Left anterior descending artery:  Gave rise to a septal perforator and a  diagonal branch.  The LAD was somewhat irregular but there was no major  obstruction.  There was also a right ventricular branch which arose from the  LAD just after the diagonal branch and supplied what appeared to be most of  the right ventricle.   Circumflex artery:  Was a dominant vessel.  It gave rise to a small  intermediate branch, a large marginal branch which supplied part of the  inferior septum and posterolateral and posterior descending branch.  This  vessel plus a wrap around LAD appeared to supply blood supply to the entire  inferior wall.   Right coronary artery:  Was not seen on aortic root injection and as  mentioned above, consisted of a nondominant right ventricular branch arising  from the LAD.   LEFT VENTRICULOGRAM:  The left ventriculogram performed in the RAO  projection showed global hypokinesis with an estimated ejection fraction of  30-35%.   The aortic pressure was 152/82 with a mean of 115.  Left ventricular  pressure was 152/22.   CONCLUSIONS:  1. Nonobstructive coronary artery disease with minimal irregularities in the     left anterior descending and no major obstruction in the circumflex     artery and  a small nondominant right coronary artery arising from the     left anterior descending.  2. Moderately severe left ventricular dysfunction with an ejection fraction     of 30-35% and global hypokinesis.    RECOMMENDATIONS:  The patient appears to have a nonischemic cardiomyopathy.  He is already on beta blockers and aspirin and will add enalapril.  He does  not have heart failure or ventricular tachycardia so there is no indication  to consider an ICD at the present time.  I discussed his situation with Dr.  Lovena Le.                                               Eustace Quail, M.D.    BB/MEDQ  D:  12/29/2002  T:  12/30/2002  Job:  [id number]   cc:   Heinz Knuckles. Norins, M.D. [facility]   CP Lab

## 2010-10-12 NOTE — Cardiovascular Report (Signed)
[place]  Patient:    MORIREOLUWA, SHIPPEN                      MRN: [id number] Proc. Date: 11/05/00 Adm. Date:  [id number] Attending:  Fatima Sanger CC:         Pocahontas Norins, M.D. [facility]  Cardiac Catheterization Laboratory   Cardiac Catheterization  CLINICAL HISTORY:  Mr. Dukart is 70 years old and has no prior history of known heart disease.  He was at work and had his pulse taken and it was found to be irregular and so he was referred to Dr. Linda Hedges.  Dr. Linda Hedges obtained a history of some exertional chest tightness suggestive of angina and referred him to [place] for evaluation.  I saw him with Cora Daniels and we felt that evaluation with angiography was indicated.  He does have a history of hypertension and borderline glucose intolerance and has a positive family history for coronary heart disease.  PROCEDURE:  The procedure was performed via the right femoral artery using arterial sheath and 6-French preformed coronary catheters.  We had some bleeding around the sheath and upgraded it to a 7-French sheath.  We had difficulty selectively entering the right coronary artery which is a small vessel.  We tried multiple catheters, but finally had to settle for an aortic root subselective injection.  We did a right femoral angiogram at the end of the procedure, but the stick was below the bifurcation so we did not close with Perclose.  The patient tolerated the procedure well and left the laboratory in satisfactory condition.  RESULTS: The left main coronary artery was free of significant disease.  The left anterior descending artery gave rise to a septal perforator and a diagonal branch.  The LAD was irregular, but there was no major obstruction. There was an anomalous branch from the LAD which supplied part of the right ventricle.  The circumflex artery was a dominant vessel that gave rise to an intermediate branch, a large marginal  branch, two posterolateral branches, and a posterior descending branch.  These vessels were free of significant disease.  The right coronary artery was nonselectively injected and was visualized on the aortic root injection.  This was a very small vessel that supplied only a very small portion of the right ventricle.  We did not see much detail regarding the vessel anatomy.  The left ventriculogram performed in the RAO projection showed good wall motion with no areas of hypokinesis.  The estimated ejection fraction was 55%.  HEMODYNAMICS:  The aortic pressure was 138/103, left ventricular pressure 138/21.  CONCLUSION:  Normal coronary angiography and left ventricular wall motion.  RECOMMENDATIONS:  Reassurance.  We will plan to let the patient go home as an outpatient today and plan a groin check in one week and then followup with that after Dr. Linda Hedges. DD:  11/05/00 TD:  11/05/00 Job: [id number]

## 2010-10-12 NOTE — Procedures (Signed)
[facility]  Patient:    Erik [name], Erik [name]                      MRN: [id number] Proc. Date: 02/22/00 Adm. Date:  [id number] Attending:  Orvis Brill CC:         [place] Norins, M.D. [facility]   Procedure Report  PROCEDURE:  Colonoscopy with polypectomy.  INDICATIONS FOR PROCEDURE:  Family history of colon polyps due for colonic screening.  Consent was signed after risks, benefits, methods, and options were thoroughly discussed in the office.  MEDICINES USED:  Demerol 70, Versed 6.  DESCRIPTION OF PROCEDURE:  Rectal inspection was pertinent for external hemorrhoids. Digital exam was negative. Video colonoscope was inserted and easily advanced around the colon to the cecum. This did not require any abdominal pressure or any position changes. The cecum was identified by the appendiceal orifice and the ileocecal valve. In fact, the scope was inserted a short ways into the terminal ileum which was normal. Photo documentation was obtained and the scope was slowly withdrawn. The prep was adequate. There was minimal liquid stool that required washing and suctioning. On slow withdrawal through the colon, the terminal ileum, cecum, ascending, and transverse were normal. The scope was withdrawn around the splenic flexure. A questionable tiny polyp was seen and was hot biopsied x 1. The scope was further withdrawn. In the distal sigmoid was a rare early left sided diverticula as well as some wall inflammation probably from the prep but a few cold biopsies were obtained. Back in the rectum, a small 3 mm polyp was seen and was hot biopsied x 3 and put in the same container with the first polyp. The scope retroflexed revealing some small internal hemorrhoids. The scope was straightened and readvanced a short ways up the sigmoid, air was suctioned, the scope removed. The patient tolerated the procedure well and there was no obvious or  immediate complication.  ENDOSCOPIC DIAGNOSIS: 1. Internal/external hemorrhoids. 2. Early rare left sided diverticula. 3. Minimal sigmoid inflammation probably prep induced status post cold biopsy. 4. Two tiny to small polyps, one in the rectum, one in the descending status    post hot biopsy. 5. Otherwise within normal limits to the terminal ileum.  PLAN:  Await pathology to determine future colonic screening. Happy to see back p.r.n. One week customary post polypectomy instructions otherwise return care to Dr. Linda Hedges for the customary health care maintenance to include yearly rectals and guaiacs. DD:  02/22/00 TD:  02/22/00 Job: [id number]

## 2010-11-07 ENCOUNTER — Other Ambulatory Visit: Payer: Self-pay | Admitting: *Deleted

## 2010-11-07 MED ORDER — METOPROLOL TARTRATE 50 MG PO TABS: ORAL_TABLET | ORAL | Status: DC

## 2011-01-11 ENCOUNTER — Other Ambulatory Visit: Payer: Self-pay | Admitting: *Deleted

## 2011-01-11 MED ORDER — ENALAPRIL MALEATE 5 MG PO TABS: 5.0000 mg | ORAL_TABLET | Freq: Two times a day (BID) | ORAL | Status: DC

## 2011-01-11 NOTE — Telephone Encounter (Signed)
rx sent in today. Julaine Hua

## 2011-02-07 ENCOUNTER — Other Ambulatory Visit: Payer: Self-pay | Admitting: Cardiology

## 2011-02-18 LAB — CBC
HCT: 44.8
Hemoglobin: 15.4
MCHC: 34.5
MCV: 90.7
Platelets: 169
RBC: 4.94
RDW: 13.4
WBC: 12.5 — ABNORMAL HIGH

## 2011-02-18 LAB — BASIC METABOLIC PANEL
CO2: 24
Calcium: 8.4
Creatinine, Ser: 1.69 — ABNORMAL HIGH
GFR calc Af Amer: 49 — ABNORMAL LOW
GFR calc non Af Amer: 41 — ABNORMAL LOW

## 2011-02-18 LAB — DIFFERENTIAL
Basophils Absolute: 0
Basophils Relative: 0
Eosinophils Absolute: 0.1
Eosinophils Relative: 1
Lymphocytes Relative: 11 — ABNORMAL LOW
Lymphs Abs: 1.4
Monocytes Absolute: 0.5
Monocytes Relative: 4
Neutro Abs: 10.6 — ABNORMAL HIGH
Neutrophils Relative %: 85 — ABNORMAL HIGH

## 2011-02-18 LAB — BASIC METABOLIC PANEL WITH GFR
BUN: 22
Chloride: 106
Glucose, Bld: 180 — ABNORMAL HIGH
Potassium: 5.4 — ABNORMAL HIGH
Sodium: 136

## 2011-02-19 LAB — BASIC METABOLIC PANEL WITH GFR
BUN: 23
BUN: 24 — ABNORMAL HIGH
BUN: 33 — ABNORMAL HIGH
CO2: 25
CO2: 26
CO2: 27
Calcium: 8.1 — ABNORMAL LOW
Calcium: 8.2 — ABNORMAL LOW
Calcium: 8.3 — ABNORMAL LOW
Chloride: 100
Chloride: 102
Chloride: 98
Creatinine, Ser: 1.55 — ABNORMAL HIGH
Creatinine, Ser: 1.68 — ABNORMAL HIGH
Creatinine, Ser: 2.2 — ABNORMAL HIGH
GFR calc non Af Amer: 30 — ABNORMAL LOW
GFR calc non Af Amer: 41 — ABNORMAL LOW
GFR calc non Af Amer: 45 — ABNORMAL LOW
Glucose, Bld: 156 — ABNORMAL HIGH
Glucose, Bld: 167 — ABNORMAL HIGH
Glucose, Bld: 173 — ABNORMAL HIGH
Potassium: 4.3
Potassium: 4.4
Potassium: 4.7
Sodium: 133 — ABNORMAL LOW
Sodium: 134 — ABNORMAL LOW
Sodium: 134 — ABNORMAL LOW

## 2011-02-19 LAB — CBC
HCT: 24.5 — ABNORMAL LOW
HCT: 27.8 — ABNORMAL LOW
HCT: 29.7 — ABNORMAL LOW
HCT: 34.3 — ABNORMAL LOW
HCT: 40.9
Hemoglobin: 10.5 — ABNORMAL LOW
Hemoglobin: 11.8 — ABNORMAL LOW
Hemoglobin: 14.2
Hemoglobin: 8.4 — ABNORMAL LOW
Hemoglobin: 8.5 — ABNORMAL LOW
Hemoglobin: 9.7 — ABNORMAL LOW
MCHC: 34.2
MCHC: 34.3
MCHC: 34.6
MCHC: 34.8
MCHC: 35.2
MCHC: 35.3
MCV: 91.2
MCV: 91.9
MCV: 92
MCV: 92.1
MCV: 92.1
MCV: 92.1
Platelets: 136 — ABNORMAL LOW
Platelets: 143 — ABNORMAL LOW
Platelets: 165
Platelets: 171
Platelets: 209
RBC: 2.66 — ABNORMAL LOW
RBC: 2.67 — ABNORMAL LOW
RBC: 3.02 — ABNORMAL LOW
RBC: 3.26 — ABNORMAL LOW
RBC: 4.45
RDW: 13.7
RDW: 13.7
RDW: 13.7
RDW: 13.7
RDW: 13.9
RDW: 14
WBC: 11.1 — ABNORMAL HIGH
WBC: 11.8 — ABNORMAL HIGH
WBC: 11.9 — ABNORMAL HIGH
WBC: 6.9
WBC: 7.4

## 2011-02-19 LAB — URINALYSIS, ROUTINE W REFLEX MICROSCOPIC
Bilirubin Urine: NEGATIVE
Bilirubin Urine: NEGATIVE
Glucose, UA: 250 — AB
Glucose, UA: NEGATIVE
Glucose, UA: NEGATIVE
Hgb urine dipstick: NEGATIVE
Hgb urine dipstick: NEGATIVE
Ketones, ur: NEGATIVE
Ketones, ur: NEGATIVE
Ketones, ur: NEGATIVE
Nitrite: NEGATIVE
Nitrite: NEGATIVE
Protein, ur: NEGATIVE
Protein, ur: NEGATIVE
Specific Gravity, Urine: 1.011
Specific Gravity, Urine: 1.019
Urobilinogen, UA: 0.2
Urobilinogen, UA: 0.2
pH: 5
pH: 5
pH: 5

## 2011-02-19 LAB — BASIC METABOLIC PANEL
BUN: 21
BUN: 23
BUN: 44 — ABNORMAL HIGH
CO2: 25
Calcium: 8 — ABNORMAL LOW
Calcium: 8.1 — ABNORMAL LOW
Chloride: 103
Creatinine, Ser: 1.72 — ABNORMAL HIGH
Creatinine, Ser: 1.8 — ABNORMAL HIGH
GFR calc Af Amer: 48 — ABNORMAL LOW
GFR calc non Af Amer: 32 — ABNORMAL LOW
GFR calc non Af Amer: 40 — ABNORMAL LOW
Glucose, Bld: 188 — ABNORMAL HIGH
Sodium: 131 — ABNORMAL LOW

## 2011-02-19 LAB — URINE CULTURE
Colony Count: 5000
Colony Count: >=100000
Colony Count: NO GROWTH
Culture: NO GROWTH
Special Requests: NEGATIVE
Special Requests: NEGATIVE
Special Requests: NEGATIVE

## 2011-02-19 LAB — TYPE AND SCREEN: Antibody Screen: NEGATIVE

## 2011-02-19 LAB — DIFFERENTIAL
Basophils Absolute: 0
Basophils Absolute: 0
Basophils Relative: 0
Basophils Relative: 0
Eosinophils Absolute: 0.1
Eosinophils Relative: 1
Eosinophils Relative: 2
Lymphocytes Relative: 14
Lymphocytes Relative: 18
Lymphs Abs: 1.5
Monocytes Absolute: 1.2 — ABNORMAL HIGH
Monocytes Absolute: 1.3 — ABNORMAL HIGH
Monocytes Relative: 12
Neutro Abs: 5.3
Neutro Abs: 8.1 — ABNORMAL HIGH
Neutrophils Relative %: 73

## 2011-02-19 LAB — CULTURE, BLOOD (ROUTINE X 2): Culture: NO GROWTH

## 2011-02-19 LAB — HEMOGLOBIN A1C
Hgb A1c MFr Bld: 6.9 — ABNORMAL HIGH
Mean Plasma Glucose: 168

## 2011-02-19 LAB — ABO/RH: ABO/RH(D): B NEG

## 2011-02-19 LAB — PROTIME-INR
INR: 1.1
Prothrombin Time: 14.1

## 2011-03-11 ENCOUNTER — Other Ambulatory Visit: Payer: Self-pay

## 2011-07-03 DIAGNOSIS — N529 Male erectile dysfunction, unspecified: Secondary | ICD-10-CM | POA: Insufficient documentation

## 2011-08-12 ENCOUNTER — Other Ambulatory Visit: Payer: Self-pay | Admitting: Cardiology

## 2011-09-04 ENCOUNTER — Ambulatory Visit (INDEPENDENT_AMBULATORY_CARE_PROVIDER_SITE_OTHER): Payer: Medicare Other | Admitting: Cardiology

## 2011-09-04 ENCOUNTER — Encounter: Payer: Self-pay | Admitting: Cardiology

## 2011-09-04 VITALS — BP 132/74 | HR 73 | Ht 71.0 in | Wt 208.0 lb

## 2011-09-04 DIAGNOSIS — I2510 Atherosclerotic heart disease of native coronary artery without angina pectoris: Secondary | ICD-10-CM

## 2011-09-04 DIAGNOSIS — I428 Other cardiomyopathies: Secondary | ICD-10-CM

## 2011-09-04 DIAGNOSIS — I10 Essential (primary) hypertension: Secondary | ICD-10-CM

## 2011-09-04 DIAGNOSIS — E785 Hyperlipidemia, unspecified: Secondary | ICD-10-CM

## 2011-09-04 NOTE — Assessment & Plan Note (Signed)
Doing well without any recurrent clinical findings suggestive of deterioration in LV function. His last echocardiogram showed normal LV systolic function. We'll continue with secondary preventative medications but I reviewed with him today including his ACE inhibitor and beta blocker. I will arrange for followup again in a year.

## 2011-09-04 NOTE — Assessment & Plan Note (Signed)
No symptoms of angina or ischemia. Continue current aggressive secondary preventive therapy including statin and aspirin. Reviewed with patient and wife.

## 2011-09-04 NOTE — Patient Instructions (Signed)
Your physician recommends that you continue on your current medications as directed. Please refer to the Current Medication list given to you today.   Your physician wants you to follow-up in: 1 year with Dr. Wall. You will receive a reminder letter in the mail two months in advance. If you don't receive a letter, please call our office to schedule the follow-up appointment.  

## 2011-09-04 NOTE — Progress Notes (Signed)
HPI [name] returns today for evaluation and management of his history of a primary cardiomyopathy. He also has nonobstructive coronary artery disease. His last echocardiogram showed good LV function with ejection fraction of 55% or greater.  He is followed by Dr. Osborne Casco a primary care for his blood work. He says his numbers have been very good. He is very compliant with medications.  His weight is stable. He denies orthopnea, PND or edema. He has no significant dyspnea on exertion. He denies angina.  Past Medical History  Diagnosis Date  . Hyperlipidemia   . Hypertension   . Diabetes mellitus   . GERD (gastroesophageal reflux disease)   . Cardiomyopathy     non-specific, ejection fraction improved to 40-45%  . Coronary artery disease     nonobstructive  . Premature ventricular contractions     Current Outpatient Prescriptions  Medication Sig Dispense Refill  . aspirin 81 MG tablet Take 81 mg by mouth daily.        [name] atorvastatin (LIPITOR) 80 MG tablet Take 80 mg by mouth daily.      . Coenzyme Q10 (COQ10) 100 MG CAPS Take 1 tablet by mouth daily.        . enalapril (VASOTEC) 5 MG tablet Take 1 tablet (5 mg total) by mouth 2 (two) times daily.  99991111 tablet  3  . folic acid (FOLVITE) 1 MG tablet Take 1 mg by mouth daily.        [name] glimepiride (AMARYL) 2 MG tablet Take 4 mg by mouth daily before breakfast.       . hydrochlorothiazide 25 MG tablet Take 25 mg by mouth daily.        [name] imipramine (TOFRANIL) 50 MG tablet Take 50 mg by mouth. 3 tablets once a day       . metFORMIN (GLUCOPHAGE) 850 MG tablet Take 850 mg by mouth 2 (two) times daily with a meal.      . metoprolol (LOPRESSOR) 50 MG tablet TAKE 1 AND 1/2 TABLETS BY MOUTH TWICE DAILY  270 tablet  2  . Multiple Vitamin (MULTIVITAMIN) tablet Take 1 tablet by mouth daily.        . Nutritional Supplements (DHEA PO) Take 1 tablet by mouth daily. vitamin       . omeprazole (PRILOSEC) 40 MG capsule Take 40 mg by mouth daily.          . vitamin E 400 UNIT capsule Take 400 Units by mouth daily.          No Known Allergies  No family history on file.  History   Social History  . Marital Status: Married    Spouse Name: N/A    Number of Children: N/A  . Years of Education: N/A   Occupational History  . retired     from [employer]   Social History Main Topics  . Smoking status: Former Smoker    Types: Cigarettes  . Smokeless tobacco: Not on file   Comment: quit 35 yrs ago  . Alcohol Use: Not on file  . Drug Use: Not on file  . Sexually Active: Not on file   Other Topics Concern  . Not on file   Social History Narrative  . No narrative on file    ROS ALL NEGATIVE EXCEPT THOSE NOTED IN HPI  PE  General Appearance: well developed, well nourished in no acute distress HEENT: symmetrical face, PERRLA, good dentition  Neck: no JVD, thyromegaly, or adenopathy, trachea midline Chest:  symmetric without deformity Cardiac: PMI non-displaced, RRR, normal S1, S2, no gallop or murmur Lung: clear to ausculation and percussion Vascular: all pulses full without bruits  Abdominal: nondistended, nontender, good bowel sounds, no HSM, no bruits Extremities: no cyanosis, clubbing or edema, no sign of DVT, no varicosities  Skin: normal color, no rashes Neuro: alert and oriented x 3, non-focal Pysch: normal affect  EKG Normal sinus rhythm, normal EKG BMET    Component Value Date/Time   NA 131* 08/31/2007 0920   K 4.4 HEMOLYZED SPECIMEN, RESULTS MAY BE AFFECTED 08/31/2007 0920   CL 98 08/31/2007 0920   CO2 24 08/31/2007 0920   GLUCOSE 188* 08/31/2007 0920   BUN 44* 08/31/2007 0920   CREATININE 2.08* 08/31/2007 0920   CALCIUM 8.0* 08/31/2007 0920   GFRNONAA 32* 08/31/2007 0920   GFRAA  Value: 39        The eGFR has been calculated using the MDRD equation. This calculation has not been validated in all clinical* 08/31/2007 0920    Lipid Panel  No results found for this basename: chol, trig, hdl, cholhdl, vldl, ldlcalc     CBC    Component Value Date/Time   WBC 7.4 08/31/2007 0920   RBC 2.67* 08/31/2007 0920   HGB 8.4* 08/31/2007 0920   HCT 24.5* 08/31/2007 0920   PLT 209 08/31/2007 0920   MCV 92.0 08/31/2007 0920   MCHC 34.2 08/31/2007 0920   RDW 14.0 08/31/2007 0920   LYMPHSABS 1.5 08/27/2007 2345   MONOABS 1.2* 08/27/2007 2345   EOSABS 0.1 08/27/2007 2345   BASOSABS 0.0 08/27/2007 2345

## 2012-01-15 ENCOUNTER — Other Ambulatory Visit: Payer: Self-pay | Admitting: Cardiology

## 2012-04-16 ENCOUNTER — Other Ambulatory Visit: Payer: Self-pay | Admitting: *Deleted

## 2012-04-16 MED ORDER — METOPROLOL TARTRATE 50 MG PO TABS: 50.0000 mg | ORAL_TABLET | Freq: Two times a day (BID) | ORAL | Status: DC

## 2012-05-07 ENCOUNTER — Other Ambulatory Visit: Payer: Self-pay | Admitting: *Deleted

## 2012-05-07 MED ORDER — METOPROLOL TARTRATE 50 MG PO TABS: 75.0000 mg | ORAL_TABLET | Freq: Two times a day (BID) | ORAL | Status: DC

## 2012-07-07 DIAGNOSIS — R809 Proteinuria, unspecified: Secondary | ICD-10-CM | POA: Insufficient documentation

## 2012-10-13 ENCOUNTER — Other Ambulatory Visit: Payer: Self-pay | Admitting: *Deleted

## 2012-10-13 MED ORDER — ENALAPRIL MALEATE 5 MG PO TABS: ORAL_TABLET | ORAL | Status: DC

## 2012-10-29 ENCOUNTER — Ambulatory Visit (INDEPENDENT_AMBULATORY_CARE_PROVIDER_SITE_OTHER): Payer: Medicare Other | Admitting: Cardiology

## 2012-10-29 ENCOUNTER — Encounter: Payer: Self-pay | Admitting: Cardiology

## 2012-10-29 VITALS — BP 134/72 | HR 82 | Ht 71.0 in | Wt 201.0 lb

## 2012-10-29 DIAGNOSIS — I10 Essential (primary) hypertension: Secondary | ICD-10-CM

## 2012-10-29 DIAGNOSIS — I428 Other cardiomyopathies: Secondary | ICD-10-CM

## 2012-10-29 DIAGNOSIS — I2510 Atherosclerotic heart disease of native coronary artery without angina pectoris: Secondary | ICD-10-CM

## 2012-10-29 DIAGNOSIS — E785 Hyperlipidemia, unspecified: Secondary | ICD-10-CM

## 2012-10-29 NOTE — Assessment & Plan Note (Signed)
Asymptomatic, continue secondary preventative therapy.

## 2012-10-29 NOTE — Progress Notes (Signed)
HPI Erik [name] returns today for evaluation and management of nonobstructive coronary artery disease, dilated primary cardiomyopathy, hyperlipidemia and hypertension. He is followed by Dr. Osborne Casco.   He is having no chest discomfort, orthopnea, PND or edema. He really does not have much  dyspnea on exertion. He is compliant with his medications. Labs are followed by primary care.  Past Medical History  Diagnosis Date  . Hyperlipidemia   . Hypertension   . Diabetes mellitus   . GERD (gastroesophageal reflux disease)   . Cardiomyopathy     non-specific, ejection fraction improved to 40-45%  . Coronary artery disease     nonobstructive  . Premature ventricular contractions     Current Outpatient Prescriptions  Medication Sig Dispense Refill  . aspirin 81 MG tablet Take 81 mg by mouth daily.        [name] atorvastatin (LIPITOR) 80 MG tablet Take 80 mg by mouth daily.      . Coenzyme Q10 (COQ10) 100 MG CAPS Take 1 tablet by mouth daily.        . enalapril (VASOTEC) 5 MG tablet TAKE 1 TABLET BY MOUTH TWICE DAILY  99991111 tablet  2  . folic acid (FOLVITE) 1 MG tablet Take 1 mg by mouth daily.        [name] glimepiride (AMARYL) 2 MG tablet Take 4 mg by mouth daily before breakfast.       . hydrochlorothiazide 25 MG tablet Take 25 mg by mouth daily.        [name] imipramine (TOFRANIL) 50 MG tablet Take 50 mg by mouth. 3 tablets once a day       . metFORMIN (GLUCOPHAGE) 850 MG tablet Take 850 mg by mouth 2 (two) times daily with a meal.      . metoprolol (LOPRESSOR) 50 MG tablet Take 1.5 tablets (75 mg total) by mouth 2 (two) times daily.  270 tablet  1  . Multiple Vitamin (MULTIVITAMIN) tablet Take 1 tablet by mouth daily.        . Nutritional Supplements (DHEA PO) Take 1 tablet by mouth daily. vitamin       . omeprazole (PRILOSEC) 40 MG capsule Take 40 mg by mouth daily.        . vitamin E 400 UNIT capsule Take 400 Units by mouth daily.         No current facility-administered medications for this visit.     No Known Allergies  No family history on file.  History   Social History  . Marital Status: Married    Spouse Name: N/A    Number of Children: N/A  . Years of Education: N/A   Occupational History  . retired     from [employer]   Social History Main Topics  . Smoking status: Former Smoker    Types: Cigarettes  . Smokeless tobacco: Not on file     Comment: quit 35 yrs ago  . Alcohol Use: Not on file  . Drug Use: Not on file  . Sexually Active: Not on file   Other Topics Concern  . Not on file   Social History Narrative  . No narrative on file    ROS ALL NEGATIVE EXCEPT THOSE NOTED IN HPI  PE  General Appearance: well developed, well nourished in no acute distress, overweight HEENT: symmetrical face, PERRLA, good dentition  Neck: no JVD, thyromegaly, or adenopathy, trachea midline Chest: symmetric without deformity Cardiac: PMI non-displaced, RRR, normal S1, S2, no gallop or murmur Lung: clear to  ausculation and percussion Vascular: all pulses full without bruits  Abdominal: nondistended, nontender, good bowel sounds, no HSM, no bruits Extremities: no cyanosis, clubbing or edema, no sign of DVT, no varicosities  Skin: normal color, no rashes Neuro: alert and oriented x 3, non-focal, right hand intention tremor, no other cerebellar signs Pysch: normal affect  EKG Normal sinus rhythm, normal EKG BMET    Component Value Date/Time   NA 131* 08/31/2007 0920   K 4.4 HEMOLYZED SPECIMEN, RESULTS MAY BE AFFECTED 08/31/2007 0920   CL 98 08/31/2007 0920   CO2 24 08/31/2007 0920   GLUCOSE 188* 08/31/2007 0920   BUN 44* 08/31/2007 0920   CREATININE 2.08* 08/31/2007 0920   CALCIUM 8.0* 08/31/2007 0920   GFRNONAA 32* 08/31/2007 0920   GFRAA  Value: 39        The eGFR has been calculated using the MDRD equation. This calculation has not been validated in all clinical* 08/31/2007 0920    Lipid Panel  No results found for this basename: chol, trig, hdl, cholhdl, vldl, ldlcalc     CBC    Component Value Date/Time   WBC 7.4 08/31/2007 0920   RBC 2.67* 08/31/2007 0920   HGB 8.4* 08/31/2007 0920   HCT 24.5* 08/31/2007 0920   PLT 209 08/31/2007 0920   MCV 92.0 08/31/2007 0920   MCHC 34.2 08/31/2007 0920   RDW 14.0 08/31/2007 0920   LYMPHSABS 1.5 08/27/2007 2345   MONOABS 1.2* 08/27/2007 2345   EOSABS 0.1 08/27/2007 2345   BASOSABS 0.0 08/27/2007 2345

## 2012-10-29 NOTE — Patient Instructions (Signed)
Your physician recommends that you continue on your current medications as directed. Please refer to the Current Medication list given to you today.  Your physician wants you to follow-up in: 1 year with Dr. [name]. You will receive a reminder letter in the mail two months in advance. If you don't receive a letter, please call our office to schedule the follow-up appointment.

## 2012-10-29 NOTE — Assessment & Plan Note (Signed)
On good medical program to prevent progressive left ventricular systolic dysfunction. No change in medical therapy. Followup with Dr. [name] in one year

## 2012-12-02 ENCOUNTER — Inpatient Hospital Stay (HOSPITAL_COMMUNITY)
Admission: AD | Admit: 2012-12-02 | Discharge: 2012-12-04 | DRG: 603 | Disposition: A | Discharge status: Home / Self Care | Payer: Medicare Other | Source: Ambulatory Visit | Attending: Internal Medicine | Admitting: Internal Medicine

## 2012-12-02 ENCOUNTER — Encounter (HOSPITAL_COMMUNITY): Payer: Self-pay | Admitting: Internal Medicine

## 2012-12-02 DIAGNOSIS — Z860100 Personal history of colon polyps, unspecified: Secondary | ICD-10-CM

## 2012-12-02 DIAGNOSIS — L02619 Cutaneous abscess of unspecified foot: Principal | ICD-10-CM | POA: Diagnosis present

## 2012-12-02 DIAGNOSIS — I2510 Atherosclerotic heart disease of native coronary artery without angina pectoris: Secondary | ICD-10-CM | POA: Diagnosis present

## 2012-12-02 DIAGNOSIS — K219 Gastro-esophageal reflux disease without esophagitis: Secondary | ICD-10-CM | POA: Diagnosis present

## 2012-12-02 DIAGNOSIS — I509 Heart failure, unspecified: Secondary | ICD-10-CM | POA: Diagnosis present

## 2012-12-02 DIAGNOSIS — E1122 Type 2 diabetes mellitus with diabetic chronic kidney disease: Secondary | ICD-10-CM

## 2012-12-02 DIAGNOSIS — N183 Chronic kidney disease, stage 3 unspecified: Secondary | ICD-10-CM | POA: Diagnosis present

## 2012-12-02 DIAGNOSIS — L0390 Cellulitis, unspecified: Secondary | ICD-10-CM

## 2012-12-02 DIAGNOSIS — I129 Hypertensive chronic kidney disease with stage 1 through stage 4 chronic kidney disease, or unspecified chronic kidney disease: Secondary | ICD-10-CM | POA: Diagnosis present

## 2012-12-02 DIAGNOSIS — I4949 Other premature depolarization: Secondary | ICD-10-CM | POA: Diagnosis present

## 2012-12-02 DIAGNOSIS — E785 Hyperlipidemia, unspecified: Secondary | ICD-10-CM | POA: Diagnosis present

## 2012-12-02 DIAGNOSIS — E1129 Type 2 diabetes mellitus with other diabetic kidney complication: Secondary | ICD-10-CM

## 2012-12-02 DIAGNOSIS — N058 Unspecified nephritic syndrome with other morphologic changes: Secondary | ICD-10-CM | POA: Diagnosis present

## 2012-12-02 DIAGNOSIS — E119 Type 2 diabetes mellitus without complications: Secondary | ICD-10-CM | POA: Diagnosis present

## 2012-12-02 DIAGNOSIS — I428 Other cardiomyopathies: Secondary | ICD-10-CM | POA: Diagnosis present

## 2012-12-02 HISTORY — DX: Type 2 diabetes mellitus with diabetic chronic kidney disease: E11.22

## 2012-12-02 LAB — COMPREHENSIVE METABOLIC PANEL
ALT: 22 U/L (ref 0–53)
AST: 17 U/L (ref 0–37)
Albumin: 3.5 g/dL (ref 3.5–5.2)
Alkaline Phosphatase: 106 U/L (ref 39–117)
CO2: 26 mEq/L (ref 19–32)
Chloride: 100 mEq/L (ref 96–112)
Creatinine, Ser: 1.78 mg/dL — ABNORMAL HIGH (ref 0.50–1.35)
GFR calc non Af Amer: 36 mL/min — ABNORMAL LOW (ref >90)
Potassium: 5.2 mEq/L — ABNORMAL HIGH (ref 3.5–5.1)
Total Bilirubin: 0.7 mg/dL (ref 0.3–1.2)

## 2012-12-02 LAB — SEDIMENTATION RATE: Sed Rate: 18 mm/hr — ABNORMAL HIGH (ref 0–16)

## 2012-12-02 LAB — CBC
MCH: 31 pg (ref 26.0–34.0)
MCHC: 34.7 g/dL (ref 30.0–36.0)
MCV: 89.2 fL (ref 78.0–100.0)
Platelets: 189 10*3/uL (ref 150–400)
RBC: 4.62 MIL/uL (ref 4.22–5.81)
RDW: 13.5 % (ref 11.5–15.5)

## 2012-12-02 MED ORDER — ADULT MULTIVITAMIN W/MINERALS CH
1.0000 | ORAL_TABLET | Freq: Every day | ORAL | Status: DC
  Administered 2012-12-03 – 2012-12-04 (×2): 1 via ORAL
  Filled 2012-12-02 (×2): qty 1

## 2012-12-02 MED ORDER — METFORMIN HCL 850 MG PO TABS
850.0000 mg | ORAL_TABLET | Freq: Two times a day (BID) | ORAL | Status: DC
  Administered 2012-12-02 – 2012-12-04 (×4): 850 mg via ORAL
  Filled 2012-12-02 (×6): qty 1

## 2012-12-02 MED ORDER — ENOXAPARIN SODIUM 40 MG/0.4ML ~~LOC~~ SOLN
40.0000 mg | SUBCUTANEOUS | Status: DC
  Administered 2012-12-02 – 2012-12-03 (×2): 40 mg via SUBCUTANEOUS
  Filled 2012-12-02 (×3): qty 0.4

## 2012-12-02 MED ORDER — ACETAMINOPHEN 650 MG RE SUPP: 650.0000 mg | Freq: Four times a day (QID) | RECTAL | Status: DC | PRN

## 2012-12-02 MED ORDER — METOPROLOL TARTRATE 50 MG PO TABS
75.0000 mg | ORAL_TABLET | Freq: Two times a day (BID) | ORAL | Status: DC
  Administered 2012-12-02 – 2012-12-04 (×4): 75 mg via ORAL
  Filled 2012-12-02 (×5): qty 1

## 2012-12-02 MED ORDER — HYDROCHLOROTHIAZIDE 25 MG PO TABS
25.0000 mg | ORAL_TABLET | Freq: Every day | ORAL | Status: DC
  Administered 2012-12-03 – 2012-12-04 (×2): 25 mg via ORAL
  Filled 2012-12-02 (×2): qty 1

## 2012-12-02 MED ORDER — FOLIC ACID 1 MG PO TABS
1.0000 mg | ORAL_TABLET | Freq: Every day | ORAL | Status: DC
  Administered 2012-12-03 – 2012-12-04 (×2): 1 mg via ORAL
  Filled 2012-12-02 (×2): qty 1

## 2012-12-02 MED ORDER — VANCOMYCIN HCL 10 G IV SOLR
1500.0000 mg | Freq: Once | INTRAVENOUS | Status: AC
  Administered 2012-12-02: 1500 mg via INTRAVENOUS
  Filled 2012-12-02: qty 1500

## 2012-12-02 MED ORDER — IMIPRAMINE HCL 50 MG PO TABS
150.0000 mg | ORAL_TABLET | Freq: Every day | ORAL | Status: DC
  Administered 2012-12-02 – 2012-12-03 (×2): 150 mg via ORAL
  Filled 2012-12-02 (×3): qty 3

## 2012-12-02 MED ORDER — VITAMIN E 180 MG (400 UNIT) PO CAPS
400.0000 [IU] | ORAL_CAPSULE | Freq: Every day | ORAL | Status: DC
  Administered 2012-12-03 – 2012-12-04 (×2): 400 [IU] via ORAL
  Filled 2012-12-02 (×2): qty 1

## 2012-12-02 MED ORDER — ASPIRIN EC 81 MG PO TBEC
81.0000 mg | DELAYED_RELEASE_TABLET | Freq: Every day | ORAL | Status: DC
  Administered 2012-12-03 – 2012-12-04 (×2): 81 mg via ORAL
  Filled 2012-12-02 (×2): qty 1

## 2012-12-02 MED ORDER — PANTOPRAZOLE SODIUM 40 MG PO TBEC
40.0000 mg | DELAYED_RELEASE_TABLET | Freq: Every day | ORAL | Status: DC
  Administered 2012-12-03 – 2012-12-04 (×2): 40 mg via ORAL
  Filled 2012-12-02 (×2): qty 1

## 2012-12-02 MED ORDER — INSULIN ASPART 100 UNIT/ML ~~LOC~~ SOLN
0.0000 [IU] | Freq: Three times a day (TID) | SUBCUTANEOUS | Status: DC
  Administered 2012-12-02: 5 [IU] via SUBCUTANEOUS
  Administered 2012-12-03 – 2012-12-04 (×3): 3 [IU] via SUBCUTANEOUS

## 2012-12-02 MED ORDER — ENALAPRIL MALEATE 5 MG PO TABS
5.0000 mg | ORAL_TABLET | Freq: Two times a day (BID) | ORAL | Status: DC
  Administered 2012-12-02 – 2012-12-04 (×4): 5 mg via ORAL
  Filled 2012-12-02 (×5): qty 1

## 2012-12-02 MED ORDER — SODIUM CHLORIDE 0.9 % IV SOLN
1.5000 g | Freq: Four times a day (QID) | INTRAVENOUS | Status: DC
  Administered 2012-12-02 – 2012-12-04 (×8): 1.5 g via INTRAVENOUS
  Filled 2012-12-02 (×10): qty 1.5

## 2012-12-02 MED ORDER — ATORVASTATIN CALCIUM 80 MG PO TABS
80.0000 mg | ORAL_TABLET | Freq: Every day | ORAL | Status: DC
  Administered 2012-12-02 – 2012-12-03 (×2): 80 mg via ORAL
  Filled 2012-12-02 (×3): qty 1

## 2012-12-02 MED ORDER — COQ10 100 MG PO CAPS: 1.0000 | ORAL_CAPSULE | Freq: Every day | ORAL | Status: DC

## 2012-12-02 MED ORDER — GLIMEPIRIDE 4 MG PO TABS
4.0000 mg | ORAL_TABLET | Freq: Every day | ORAL | Status: DC
  Administered 2012-12-03 – 2012-12-04 (×2): 4 mg via ORAL
  Filled 2012-12-02 (×3): qty 1

## 2012-12-02 MED ORDER — ASPIRIN 81 MG PO TABS: 81.0000 mg | ORAL_TABLET | Freq: Every day | ORAL | Status: DC

## 2012-12-02 MED ORDER — ONE-DAILY MULTI VITAMINS PO TABS: 1.0000 | ORAL_TABLET | Freq: Every day | ORAL | Status: DC

## 2012-12-02 MED ORDER — HYDROCODONE-ACETAMINOPHEN 5-325 MG PO TABS: 1.0000 | ORAL_TABLET | ORAL | Status: DC | PRN

## 2012-12-02 MED ORDER — VANCOMYCIN HCL IN DEXTROSE 1-5 GM/200ML-% IV SOLN
1000.0000 mg | INTRAVENOUS | Status: DC
  Administered 2012-12-03: 1000 mg via INTRAVENOUS
  Filled 2012-12-02 (×2): qty 200

## 2012-12-02 MED ORDER — ACETAMINOPHEN 325 MG PO TABS: 650.0000 mg | ORAL_TABLET | Freq: Four times a day (QID) | ORAL | Status: DC | PRN

## 2012-12-02 NOTE — H&P (Signed)
Vital Signs  Entered weight:  199  lbs., Calculated Weight: 199 lbs., ( 90.27 kg) Height: 69.5 in., ( 176.53 cm) Pulse rate: 92 Pulse rhythm: regular Respirations: 20  Blood Pressure #1: 130 / 68 mm Hg    BMI: 28.97 BSA: 2.07 Wt Chg: -1 lbs since 11/30/2012  Vitals entered by: Arnette Felts LPN on July  9, [id number] 10:09 AM        Risk Factors:   Smoked Tobacco Use:  Former smoker    Cigarettes:  Yes     Pack-years:  1 ppd       Year quit:  1980       Years Since Last Quit:  23    Cigars:  Yes -- 1 per week    Cigars/Pipes Year Quit:  1980    Cigars/Pipes Years Since Last Quit:  34 Smokeless Tobacco Use:  Never    Counseled :  No Passive smoke exposure:  no Drug use:  no HIV high-risk behavior:  no Caffeine use:  1 drinks per day Alcohol use:  no Exercise:  yes    Times per week:  1    Type of Exercise:  yard and house week Seatbelt use:  100 % Sun Exposure:  occasionally  Previous Tobacco Use: Signed On - 11/30/2012 Smoked Tobacco Use:  Former smoker    Cigarettes:  Yes     Pack-years:  1 ppd       Year quit:  1980       Years Since Last Quit:  34 years, 6 months, 8 days    Cigars:  Yes -- 1 per week    Cigars/Pipes Year Quit:  1980    Cigars/Pipes Years Since Last Quit:  34 Smokeless Tobacco Use:  Never    Counseled :  No Passive smoke exposure:  no Drug use:  no HIV high-risk behavior:  no Caffeine use:  1 drinks per day  Previous Alcohol Use: Signed On - 11/30/2012 Alcohol use:  no Exercise:  yes    Times per week:  1    Type of Exercise:  yard and house week Seatbelt use:  100 % Sun Exposure:  occasionally  Colonoscopy History:    Date of Last Colonoscopy:  06/20/2010  History of Present Illness  History from: patient Reason for visit: Routine Follow-up Chief Complaint: Spider bite-started draining yesterday History of Present Illness: Left foot wound has worsened since Monday.  He has been soaking, keeping clean and covered and taking his Doxy  twice daily as prescribed.  Yesterday the skin split open and has drained some clear/yellow fluid.  He was boosted with a Rocephin injection. He continues to deny systemic symptoms.   Review of Systems  General:       Denies fevers, chills, headache, sweats, anorexia, malaise, sleep disturbance.   Cardiovascular:       Denies chest pains.   Respiratory:       Denies cough, dyspnea.   Gastrointestinal:       Denies nausea, vomiting.   Musculoskeletal:       Complains of joint pain, joint swelling.   Skin:       Complains of see HPI, suspicious lesions.   Neurologic:       Complains of paresthesias.    Past History Past Medical History (reviewed - no changes required): DM II - 2002 , microalb 2014 Hyperlipidemia HTN Gerd CAD colon polyps BPH CHF - EF 30% on cath 8/04 CrCl 51.9  3/06 (CKD  3)  Surgical History (reviewed - no changes required): Left shoulder fracture Family History (reviewed - no changes required): Father deceased at 34 with heart Mother deceased at 63 with DM, heart 1 brother deceased - heart 1 brother deceased DM Social History (reviewed - no changes required): Married 3 children 1 girl and 2 boys HS   Family History Summary:     Reviewed history Last on 07/07/2012 and no changes required:12/02/2012 Mother Ailene Ravel.) - Has Family History of Diabetes - Entered On: 11/30/2012 Father Ailene Ravel.) - Has Family History of Heart Disease - Entered On: 12/02/2012  General Comments - FH: Father deceased at 29 with heart Mother deceased at 22 with DM, heart 1 brother deceased - heart 1 brother deceased DM  Social History:    Reviewed history from 03/09/2009 and no changes required:       Married       3 children       1 girl and 2 boys       HS   Physical Exam  General appearance: well nourished, well hydrated, no acute distress  Eyes  External: conjunctivae and lids normal Pupils: equal, round, reactive to light and accommodation  Ears, Nose and Throat   External ears: normal, no lesions or deformities External nose: normal, no lesions or deformities Hearing: slightly HOH Dental: good dentition  Neck  Neck: supple, no masses, trachea midline  Respiratory  Respiratory effort: no intercostal retractions or use of accessory muscles Auscultation: no rales, rhonchi, or wheezes  Cardiovascular  Auscultation: S1, S2, no murmur, rub, or gallop Pedal pulses: 1+ left-diminished Periph. circulation: toes cold, pale, foot red and hot, pulses diminished, skin 2+ and tight  Gastrointestinal  Abdomen: soft, non-tender, no masses, bowel sounds normal  Musculoskeletal  Gait and station: limping Digits and nails: slightly decreased ROM but able to move all toes, sensation slightly diminished  Skin  Inspection: arthropod bite top of left foot now 1" diameter open, dark tissue with some clear/yellow fluid discharged now with increased surrounding erythema to lateral foot and to toes, toes pale but swollen, swelling now involoving ankle, warm and tender to touch over foot, mild fluctuance  Mental Status Exam  Judgment, insight: intact Orientation: oriented to time, place, and person Memory: intact Mood and affect: Normal Mood   Impression & Recommendations:  Problem # 1:  Cellulitis (ICD-682.9) (ICD10-L03.90) Left foot worsening with DM-unknown etiology-suspected insect bite Not responding to oral abx with worsening local infection.  No systemic symptoms Reviewed with on call MD Dr. Darliss Ridgel admission for IV antibiotics Patient aware-we will call with a medical bed at [facility] His updated medication list for this problem includes:    Keflex 500 Mg Caps (Cephalexin) .[name]... Take 1 capsule by mouth four times daily   Problem # 2:  DIABETES MELLITUS, TYPE II, CONTROLLED, W/RENAL COMPS (ICD-250.40) (ICD10-E11.29) Continue current treatment.  His updated medication list for this problem includes:    Amaryl 4 Mg Tabs (Glimepiride) .[name]... Take one  tablet twice daily    Metformin Hcl 850 Mg Tabs (Metformin hcl) .[name]... 1 tablet twice daily    Aspirin 81 Mg Ec Tab (Aspirin) .[name]... Take one (1) tablet by mouth daily    Enalapril Maleate 5 Mg Tabs (Enalapril maleate) ..... One tablet twice dialy  Labs Reviewed: HgBA1c: 8.7% (11/30/2012)   Creat: 1.6 (06/30/2012)      Problem # 3:  HYPERTENSION (ICD-401.9) (ICD10-I10) Continue current treatment.  His updated medication list for this problem includes:  Enalapril Maleate 5 Mg Tabs (Enalapril maleate) ..... One tablet twice dialy    Hydrochlorothiazide 25 Mg Tabs (Hydrochlorothiazide) .[name]... Take one tablet by mouth every day    Toprol Xl 50 Mg Xr24h-tab (Metoprolol succinate) .[name]... Take 1 1/2 tablet twice daily  BP today: 130/68 Prior BP: 120/64 (11/30/2012)  Labs Reviewed: Creat: 1.6 (06/30/2012) Chol: 133 (06/30/2012)   LDL: 61 (06/30/2012)      Problem # 4:    Current Meds:  KEFLEX 500 MG CAPS (CEPHALEXIN) Take 1 capsule by mouth four times daily AMARYL 4 MG TABS (GLIMEPIRIDE) take one tablet twice daily METFORMIN HCL 850 MG TABS (METFORMIN HCL) 1 tablet twice daily OMEPRAZOLE 40 MG CPDR (OMEPRAZOLE) take one a day ASPIRIN 81 MG EC TAB (ASPIRIN) Take one (1) tablet by mouth daily ATORVASTATIN CALCIUM 40 MG TABS (ATORVASTATIN CALCIUM) take one tablet once daily COQ10 CAPS (COENZYME Q10 CAPS) once daily ENALAPRIL MALEATE 5 MG TABS (ENALAPRIL MALEATE) one tablet twice dialy GNP FOLIC ACID TABS (FOLIC ACID TABS) one tablet once daily HYDROCHLOROTHIAZIDE 25 MG TABS (HYDROCHLOROTHIAZIDE) Take one tablet by mouth every day IMIPRAMINE HCL 50 MG TABS (IMIPRAMINE HCL) take three tablets at nght TOPROL XL 50 MG XR24H-TAB (METOPROLOL SUCCINATE) take 1 1/2 tablet twice daily ESSENTIAL ONE DAILY MULTIVIT TABS (MULTIPLE VITAMINS-CALCIUM) take one a day E-1000 CAPS (VITAMIN E CAPS) once daily   FOLLOW UP PLANS    Next office visit in : admission    Electronically signed by Avanell Shackleton  NP on 12/02/2012 at 1:11 PM   I personally evaluated and examined the patient with Chrystine Oiler, NP and agree with above documentation.  He has failed outpatient oral antibiotics with progression of his cellulitis and increase in ulceration over his anterior foot.  High suspicion for MRSA.  We'll obtain wound culture.  Will admit for IV antibiotics to stabilize his infection and then transition back to oral antibiotics.

## 2012-12-02 NOTE — Progress Notes (Signed)
ANTIBIOTIC CONSULT NOTE - INITIAL  Pharmacy Consult for Vancomycin Indication: cellulitis  No Known Allergies  Patient Measurements: Height: 5\' 11"  (180.3 cm) Weight: 198 lb 3.1 oz (89.9 kg) IBW/kg (Calculated) : 75.3 Adjusted Body Weight:   Vital Signs: Temp: 98.2 F (36.8 C) (07/09 1247) Temp src: Oral (07/09 1247) BP: 139/77 mmHg (07/09 1247) Pulse Rate: 93 (07/09 1247) Intake/Output from previous day:   Intake/Output from this shift:    Labs:  Recent Labs  12/02/12 1443  WBC 8.8  HGB 14.3  PLT 189  CREATININE 1.78*   Estimated Creatinine Clearance: 38.8 ml/min (by C-G formula based on Cr of 1.78). No results found for this basename: VANCOTROUGH, VANCOPEAK, VANCORANDOM, GENTTROUGH, GENTPEAK, GENTRANDOM, TOBRATROUGH, TOBRAPEAK, TOBRARND, AMIKACINPEAK, AMIKACINTROU, AMIKACIN,  in the last 72 hours   Microbiology: No results found for this or any previous visit (from the past 720 hour(s)).  Medical History: Past Medical History  Diagnosis Date  . Hyperlipidemia   . Hypertension   . Diabetes mellitus   . GERD (gastroesophageal reflux disease)   . Cardiomyopathy     non-specific, ejection fraction improved to 40-45%  . Coronary artery disease     nonobstructive  . Premature ventricular contractions      Assessment: 58 yoM with diabetes admitted from PCP office for foot cellulitis s/p spider bite not improving on Keflex.  Admitted for IV antibiotics.  Empirically starting Unasyn and vancomycin per pharmacy dosing.  SCr 1.78, CrCl~37 ml/min/1.70m2 (normalized), weight 89.9 kg  Afebrile, WBC WNL  Wound culture sent  Goal of Therapy:  Vancomycin trough level 10-15 mcg/ml  Plan:   Vancomycin 1500 mg IV x 1 followed by 1g IV q24h.  F/u SCr and trough level for dose adjustments.  F/u wound culture results.   Hershal Coria 12/02/2012,5:21 PM

## 2012-12-02 NOTE — Progress Notes (Signed)
PHARMACIST - PHYSICIAN ORDER COMMUNICATION  CONCERNING: P&T Medication Policy on Herbal Medications  DESCRIPTION:  This patient's order for:  Co-Q10  has been noted.  This product(s) is classified as an "herbal" or natural product. Due to a lack of definitive safety studies or FDA approval, nonstandard manufacturing practices, plus the potential risk of unknown drug-drug interactions while on inpatient medications, the Pharmacy and Therapeutics Committee does not permit the use of "herbal" or natural products of this type within [facility].   ACTION TAKEN: The pharmacy department is unable to verify this order at this time and your patient has been informed of this safety policy. Please reevaluate patient's clinical condition at discharge and address if the herbal or natural product(s) should be resumed at that time.   Doreene Eland, PharmD, BCPS.   Pager: [phone] 12/02/2012 2:06 PM

## 2012-12-03 LAB — CBC
HCT: 37.4 % — ABNORMAL LOW (ref 39.0–52.0)
Hemoglobin: 12.8 g/dL — ABNORMAL LOW (ref 13.0–17.0)
MCV: 89.7 fL (ref 78.0–100.0)
RBC: 4.17 MIL/uL — ABNORMAL LOW (ref 4.22–5.81)
RDW: 13.3 % (ref 11.5–15.5)
WBC: 7.5 10*3/uL (ref 4.0–10.5)

## 2012-12-03 LAB — GLUCOSE, CAPILLARY
Glucose-Capillary: 201 mg/dL — ABNORMAL HIGH (ref 70–99)
Glucose-Capillary: 157 mg/dL — ABNORMAL HIGH (ref 70–99)
Glucose-Capillary: 183 mg/dL — ABNORMAL HIGH (ref 70–99)

## 2012-12-03 NOTE — Progress Notes (Signed)
Subjective: Reports slight improvement in redness over foot.  No fever, chills, sweats.  No new complaints.  Objective: Vital signs in last 24 hours: Temp:  [98.2 F (36.8 C)-98.3 F (36.8 C)] 98.3 F (36.8 C) (07/09 2200) Pulse Rate:  [84-93] 84 (07/09 2200) Resp:  [18-22] 18 (07/09 2200) BP: (134-139)/(55-77) 134/55 mmHg (07/09 2200) SpO2:  [98 %] 98 % (07/09 2200) Weight:  [89.9 kg (198 lb 3.1 oz)] 89.9 kg (198 lb 3.1 oz) (07/09 1247) Weight change:  Last BM Date: 12/02/12  CBG (last 3)   Recent Labs  12/02/12 1730 12/02/12 2120  GLUCAP 201* 201*    Intake/Output from previous day: 07/09 0701 - 07/10 0700 In: 910 [P.O.:360; IV Piggyback:550] Out: -  Intake/Output this shift:    General appearance: alert and no distress Eyes: no scleral icterus Throat: oropharynx moist without erythema Resp: clear to auscultation bilaterally Cardio: regular rate and rhythm without murmur GI: soft, non-tender; bowel sounds normal; no masses,  no organomegaly Extremities: left foot erythema, warmth stable/slightly improved.  Central ulcer with eschar, no drainage or fluctuance   Lab Results:  Recent Labs  12/02/12 1443  NA 136  K 5.2*  CL 100  CO2 26  GLUCOSE 220*  BUN 27*  CREATININE 1.78*  CALCIUM 8.7    Recent Labs  12/02/12 1443  AST 17  ALT 22  ALKPHOS 106  BILITOT 0.7  PROT 6.8  ALBUMIN 3.5    Recent Labs  12/02/12 1443 12/03/12 0510  WBC 8.8 7.5  HGB 14.3 12.8*  HCT 41.2 37.4*  MCV 89.2 89.7  PLT 189 172   Lab Results  Component Value Date   INR 1.1 08/26/2007    CRP 7.0 ESR 18 Wound culture pending   Studies/Results: No results found.   Medications: Scheduled: . ampicillin-sulbactam (UNASYN) IV  1.5 g Intravenous Q6H  . aspirin EC  81 mg Oral Daily  . atorvastatin  80 mg Oral q1800  . enalapril  5 mg Oral BID  . enoxaparin (LOVENOX) injection  40 mg Subcutaneous Q24H  . folic acid  1 mg Oral Daily  . glimepiride  4 mg Oral QAC  breakfast  . hydrochlorothiazide  25 mg Oral Daily  . imipramine  150 mg Oral QHS  . insulin aspart  0-15 Units Subcutaneous TID WC  . metFORMIN  850 mg Oral BID WC  . metoprolol  75 mg Oral BID  . multivitamin with minerals  1 tablet Oral Daily  . pantoprazole  40 mg Oral Daily  . vancomycin  1,000 mg Intravenous Q24H  . vitamin E  400 Units Oral Daily   Continuous:   Assessment/Plan: Active Problems: 1. Cellulitis with ulceration- ?initially arthropod bite vs. Diabetic foot ulcer.  Continue broad spectrum antibiotics until stable, then transition to oral Doxycycline.  Wound cultures pending.  No leukocytosis but CRP is elevated.  Consider Santyl for enzymatic debridement of eschar if increased ulcer size. 2. Type II or unspecified type diabetes mellitus with renal manifestations, not stated as uncontrolled(250.40)- last A1C was 8.7 indicating marginal control.  Will need to consider additional treatment but will pursue as outpatient with PCP. 3. Essential hypertension, benign- BP stable on home regimen. 4. Disposition- anticipate discharge in am if improved.    LOS: 1 day   Advit Trethewey,W DOUGLAS 12/03/2012, 6:46 AM

## 2012-12-04 LAB — GLUCOSE, CAPILLARY: Glucose-Capillary: 181 mg/dL — ABNORMAL HIGH (ref 70–99)

## 2012-12-04 LAB — CBC
HCT: 37.3 % — ABNORMAL LOW (ref 39.0–52.0)
MCHC: 34.6 g/dL (ref 30.0–36.0)
Platelets: 167 10*3/uL (ref 150–400)
RDW: 13.3 % (ref 11.5–15.5)
WBC: 6.1 10*3/uL (ref 4.0–10.5)

## 2012-12-04 LAB — WOUND CULTURE

## 2012-12-04 MED ORDER — DOXYCYCLINE HYCLATE 50 MG PO CAPS: 100.0000 mg | ORAL_CAPSULE | Freq: Two times a day (BID) | ORAL | Status: DC

## 2012-12-04 MED ORDER — DOXYCYCLINE HYCLATE 50 MG PO CAPS: 100.0000 mg | ORAL_CAPSULE | Freq: Two times a day (BID) | ORAL | Status: AC

## 2012-12-04 NOTE — Discharge Summary (Signed)
DISCHARGE SUMMARY  Erik [name]  MR#: [id number]  DOB:11-Jun-1940  Date of Admission: 12/02/2012 Date of Discharge: 12/04/2012  Attending Physician:Katilynn Sinkler,W DOUGLAS  Patient's [id number] W, MD  Consults:  None  Discharge Diagnoses: Active Problems:   Cellulitis   Type II or unspecified type diabetes mellitus with renal manifestations, not stated as uncontrolled(250.40)   Essential hypertension, benign  Past Medical History  Diagnosis Date  . Hyperlipidemia   . Hypertension   . Diabetes mellitus   . GERD (gastroesophageal reflux disease)   . Cardiomyopathy     non-specific, ejection fraction improved to 40-45%  . Coronary artery disease     nonobstructive  . Premature ventricular contractions    Past Surgical History  Procedure Laterality Date  . Cardiac catheterization  10/25/00, 12/29/02    Discharge Medications:   Medication List         aspirin 81 MG tablet  Take 81 mg by mouth daily.     atorvastatin 80 MG tablet  Commonly known as:  LIPITOR  Take 80 mg by mouth daily.     CoQ10 100 MG Caps  Take 1 tablet by mouth daily.     DHEA PO  Take 1 tablet by mouth daily. vitamin     doxycycline 50 MG capsule  Commonly known as:  VIBRAMYCIN  Take 2 capsules (100 mg total) by mouth 2 (two) times daily.     enalapril 5 MG tablet  Commonly known as:  VASOTEC  TAKE 1 TABLET BY MOUTH TWICE DAILY     folic acid 1 MG tablet  Commonly known as:  FOLVITE  Take 1 mg by mouth daily.     glimepiride 2 MG tablet  Commonly known as:  AMARYL  Take 4 mg by mouth daily before breakfast.     hydrochlorothiazide 25 MG tablet  Commonly known as:  HYDRODIURIL  Take 25 mg by mouth daily.     imipramine 50 MG tablet  Commonly known as:  TOFRANIL  Take 50 mg by mouth. 3 tablets once a day     metFORMIN 850 MG tablet  Commonly known as:  GLUCOPHAGE  Take 850 mg by mouth 2 (two) times daily with a meal.     metoprolol 50 MG tablet  Commonly known as:  LOPRESSOR   Take 1.5 tablets (75 mg total) by mouth 2 (two) times daily.     multivitamin tablet  Take 1 tablet by mouth daily.     omeprazole 40 MG capsule  Commonly known as:  PRILOSEC  Take 40 mg by mouth daily.        Hospital Procedures: No results found.  History of Present Illness: Erik [name] is a 72 year old white male with a history of diabetes mellitus type 2 with renal complications, hypertension, and coronary artery disease who presented to our office with right foot cellulitis.  He had been seen 2 days prior to his prior to admission for right foot erythema following a presumed insect bite. This was treated with Rocephin in the office followed by Keflex as an outpatient. Upon followup, the erythema had significantly worsened with an new ulcer over the right anterior foot. Given progression of the symptoms he was admitted for further management having failed outpatient antibiotics.  Hospital Course: Patient was admitted to a medical bed. He was empirically treated with vancomycin and Unasyn to cover resistant skin organisms. A wound culture was obtained and is pending at this time. He remained afebrile throughout his hospitalization with no systemic  symptoms. His white blood count remained normal. His sedimentation rate was 18 with an elevated CRP of 7. With treatment, his erythema and warmth significantly improved. The anterior foot ulcer remained very shallow with a small eschar and minimal drainage. Given improvement and nontoxic appearance he is stable for discharge home to transition to oral antibiotics. He will be changed to doxycycline to cover MRSA. He'll be reevaluated in the office within the next week and was instructed to call for a significant increase in erythema, warmth, tenderness, drainage, or fever.  Day of Discharge Exam BP 105/61  Pulse 78  Temp(Src) 97.5 F (36.4 C) (Oral)  Resp 20  Ht 5\' 11"  (1.803 m)  Wt 89.9 kg (198 lb 3.1 oz)  BMI 27.65 kg/m2  SpO2  98%  Physical Exam: General appearance: alert and no distress Eyes: no scleral icterus Throat: oropharynx moist without erythema Resp: clear to auscultation bilaterally Cardio: regular rate and rhythm and without murmur rub or gallop GI: soft, non-tender; bowel sounds normal; no masses,  no organomegaly Extremities: no clubbing, cyanosis or edema; right anterior foot erythema receding with less warmth; shallow ulcer with small eschar and minimal drainage.  Discharge Labs:  Recent Labs  12/02/12 1443  NA 136  K 5.2*  CL 100  CO2 26  GLUCOSE 220*  BUN 27*  CREATININE 1.78*  CALCIUM 8.7    Recent Labs  12/02/12 1443  AST 17  ALT 22  ALKPHOS 106  BILITOT 0.7  PROT 6.8  ALBUMIN 3.5    Recent Labs  12/03/12 0510 12/04/12 0420  WBC 7.5 6.1  HGB 12.8* 12.9*  HCT 37.4* 37.3*  MCV 89.7 89.2  PLT 172 167   Lab Results  Component Value Date   INR 1.1 08/26/2007   Sedimentation rate 18 CRP 7.0 Wound culture pending-negative to date   Discharge instructions:     Discharge Orders   Future Orders Complete By Expires     Diet Carb Modified  As directed     Discharge instructions  As directed     Comments:      Call if increased redness, drainage or warmth over foot.    Discharge wound care:  As directed     Comments:      Wash foot with mild soap (dial) and apply neosporin on nonstick bandage daily    Increase activity slowly  As directed        Disposition: To home  Follow-up Appts: Follow-up with Dr. Osborne Casco at [facility] in 1 week  Condition on Discharge: stable  Tests Needing Follow-up: Wound cultures  Signed: Yanisa Goodgame,W DOUGLAS 12/04/2012, 7:07 AM

## 2012-12-30 ENCOUNTER — Other Ambulatory Visit: Payer: Self-pay

## 2013-04-01 ENCOUNTER — Other Ambulatory Visit: Payer: Self-pay

## 2013-05-18 ENCOUNTER — Other Ambulatory Visit: Payer: Self-pay | Admitting: *Deleted

## 2013-05-18 MED ORDER — METOPROLOL TARTRATE 50 MG PO TABS: 75.0000 mg | ORAL_TABLET | Freq: Two times a day (BID) | ORAL | Status: DC

## 2013-06-18 ENCOUNTER — Other Ambulatory Visit: Payer: Self-pay

## 2013-06-18 MED ORDER — METOPROLOL TARTRATE 50 MG PO TABS: 75.0000 mg | ORAL_TABLET | Freq: Two times a day (BID) | ORAL | Status: DC

## 2013-07-05 DIAGNOSIS — R972 Elevated prostate specific antigen [PSA]: Secondary | ICD-10-CM | POA: Insufficient documentation

## 2013-07-26 ENCOUNTER — Other Ambulatory Visit: Payer: Self-pay

## 2013-07-26 MED ORDER — ENALAPRIL MALEATE 5 MG PO TABS: ORAL_TABLET | ORAL | Status: DC

## 2013-09-08 ENCOUNTER — Other Ambulatory Visit: Payer: Self-pay

## 2013-09-08 MED ORDER — METOPROLOL TARTRATE 50 MG PO TABS: 75.0000 mg | ORAL_TABLET | Freq: Two times a day (BID) | ORAL | Status: DC

## 2013-09-21 ENCOUNTER — Encounter (HOSPITAL_COMMUNITY): Payer: Self-pay | Admitting: Emergency Medicine

## 2013-09-21 ENCOUNTER — Emergency Department (HOSPITAL_COMMUNITY): Payer: Medicare Other

## 2013-09-21 ENCOUNTER — Inpatient Hospital Stay (HOSPITAL_COMMUNITY)
Admission: EM | Admit: 2013-09-21 | Discharge: 2013-09-24 | DRG: 310 | Disposition: A | Discharge status: Home / Self Care | Payer: Medicare Other | Attending: Cardiology | Admitting: Cardiology

## 2013-09-21 DIAGNOSIS — E785 Hyperlipidemia, unspecified: Secondary | ICD-10-CM | POA: Diagnosis present

## 2013-09-21 DIAGNOSIS — I059 Rheumatic mitral valve disease, unspecified: Secondary | ICD-10-CM | POA: Diagnosis present

## 2013-09-21 DIAGNOSIS — Z87891 Personal history of nicotine dependence: Secondary | ICD-10-CM

## 2013-09-21 DIAGNOSIS — I2510 Atherosclerotic heart disease of native coronary artery without angina pectoris: Secondary | ICD-10-CM | POA: Diagnosis present

## 2013-09-21 DIAGNOSIS — E119 Type 2 diabetes mellitus without complications: Secondary | ICD-10-CM | POA: Diagnosis present

## 2013-09-21 DIAGNOSIS — E1129 Type 2 diabetes mellitus with other diabetic kidney complication: Secondary | ICD-10-CM | POA: Diagnosis present

## 2013-09-21 DIAGNOSIS — R079 Chest pain, unspecified: Secondary | ICD-10-CM | POA: Diagnosis present

## 2013-09-21 DIAGNOSIS — Z7982 Long term (current) use of aspirin: Secondary | ICD-10-CM

## 2013-09-21 DIAGNOSIS — K219 Gastro-esophageal reflux disease without esophagitis: Secondary | ICD-10-CM | POA: Diagnosis present

## 2013-09-21 DIAGNOSIS — I152 Hypertension secondary to endocrine disorders: Secondary | ICD-10-CM | POA: Diagnosis present

## 2013-09-21 DIAGNOSIS — I428 Other cardiomyopathies: Secondary | ICD-10-CM | POA: Diagnosis present

## 2013-09-21 DIAGNOSIS — I4891 Unspecified atrial fibrillation: Principal | ICD-10-CM | POA: Diagnosis present

## 2013-09-21 DIAGNOSIS — I480 Paroxysmal atrial fibrillation: Secondary | ICD-10-CM | POA: Diagnosis present

## 2013-09-21 DIAGNOSIS — N183 Chronic kidney disease, stage 3 unspecified: Secondary | ICD-10-CM | POA: Diagnosis present

## 2013-09-21 DIAGNOSIS — E1159 Type 2 diabetes mellitus with other circulatory complications: Secondary | ICD-10-CM | POA: Diagnosis present

## 2013-09-21 DIAGNOSIS — I10 Essential (primary) hypertension: Secondary | ICD-10-CM | POA: Diagnosis present

## 2013-09-21 DIAGNOSIS — I129 Hypertensive chronic kidney disease with stage 1 through stage 4 chronic kidney disease, or unspecified chronic kidney disease: Secondary | ICD-10-CM | POA: Diagnosis present

## 2013-09-21 DIAGNOSIS — N1832 Chronic kidney disease, stage 3b: Secondary | ICD-10-CM | POA: Diagnosis present

## 2013-09-21 DIAGNOSIS — N058 Unspecified nephritic syndrome with other morphologic changes: Secondary | ICD-10-CM | POA: Diagnosis present

## 2013-09-21 HISTORY — DX: Chronic kidney disease, stage 3 (moderate): N18.3

## 2013-09-21 HISTORY — DX: Chronic kidney disease, stage 3 unspecified: N18.30

## 2013-09-21 HISTORY — DX: Other cardiomyopathies: I42.8

## 2013-09-21 HISTORY — DX: Other disorders of facial nerve: G51.8

## 2013-09-21 LAB — CBC
HCT: 43.1 % (ref 39.0–52.0)
Hemoglobin: 14.8 g/dL (ref 13.0–17.0)
MCH: 30.1 pg (ref 26.0–34.0)
MCHC: 34.3 g/dL (ref 30.0–36.0)
MCV: 87.8 fL (ref 78.0–100.0)
PLATELETS: 163 10*3/uL (ref 150–400)
RBC: 4.91 MIL/uL (ref 4.22–5.81)
RDW: 13.5 % (ref 11.5–15.5)
WBC: 7.3 10*3/uL (ref 4.0–10.5)

## 2013-09-21 LAB — APTT: aPTT: 27 seconds (ref 24–37)

## 2013-09-21 LAB — BASIC METABOLIC PANEL
BUN: 20 mg/dL (ref 6–23)
CALCIUM: 8.7 mg/dL (ref 8.4–10.5)
CO2: 25 mEq/L (ref 19–32)
CREATININE: 1.9 mg/dL — AB (ref 0.50–1.35)
Chloride: 100 mEq/L (ref 96–112)
GFR, EST AFRICAN AMERICAN: 38 mL/min — AB (ref >90)
GFR, EST NON AFRICAN AMERICAN: 33 mL/min — AB (ref >90)
Glucose, Bld: 213 mg/dL — ABNORMAL HIGH (ref 70–99)
Potassium: 4 mEq/L (ref 3.7–5.3)
Sodium: 141 mEq/L (ref 137–147)

## 2013-09-21 LAB — I-STAT TROPONIN, ED: TROPONIN I, POC: 0.01 ng/mL (ref 0.00–0.08)

## 2013-09-21 LAB — PROTIME-INR
INR: 0.98 (ref 0.00–1.49)
Prothrombin Time: 12.8 seconds (ref 11.6–15.2)

## 2013-09-21 LAB — D-DIMER, QUANTITATIVE: D-Dimer, Quant: 0.65 ug/mL-FEU — ABNORMAL HIGH (ref 0.00–0.48)

## 2013-09-21 MED ORDER — HEPARIN BOLUS VIA INFUSION
4000.0000 [IU] | Freq: Once | INTRAVENOUS | Status: AC
  Administered 2013-09-21: 4000 [IU] via INTRAVENOUS
  Filled 2013-09-21: qty 4000

## 2013-09-21 MED ORDER — DILTIAZEM HCL 100 MG IV SOLR
5.0000 mg/h | INTRAVENOUS | Status: DC
  Administered 2013-09-21: 5 mg/h via INTRAVENOUS
  Filled 2013-09-21: qty 100

## 2013-09-21 MED ORDER — HEPARIN (PORCINE) IN NACL 100-0.45 UNIT/ML-% IJ SOLN
1100.0000 [IU]/h | INTRAMUSCULAR | Status: DC
  Administered 2013-09-21: 1100 [IU]/h via INTRAVENOUS
  Filled 2013-09-21 (×3): qty 250

## 2013-09-21 MED ORDER — DILTIAZEM HCL 25 MG/5ML IV SOLN
10.0000 mg | Freq: Once | INTRAVENOUS | Status: AC
  Administered 2013-09-21: 10 mg via INTRAVENOUS
  Filled 2013-09-21: qty 5

## 2013-09-21 NOTE — ED Notes (Signed)
Pt reports that he push mowed his lawn today. For the past 45 minutes pt feels pressure in his central chest "like an elephant is on my chest." Pt states that he has pain in bilateral shoulders as well. Pt noted to be tachycardic at this time. Pt a&o x4, reports that "I have a bad heart muscle my doctor says." Pt a&o x4, at this time.

## 2013-09-21 NOTE — ED Provider Notes (Signed)
CSN: [id number]     Arrival date & time 09/21/13  2150 History   First MD Initiated Contact with Patient 09/21/13 2209     Chief Complaint  Patient presents with  . Chest Pain     (Consider location/radiation/quality/duration/timing/severity/associated sxs/prior Treatment) HPI Pt presenting with c/o pressure in chest and shoulders along with palpitations. He states symptoms began while he was looking at [employer].  Had been mowing lawn earlier and after that he felt some jitteriness.  No fever/chills, no cough or shortness of breath.  No lightheadedness or syncope.  He has not had any treatment prior to arrival.  No hx of similar symptoms.  Symptoms are constant and moderate.  There are no other associated systemic symptoms, there are no other alleviating or modifying factors.   Past Medical History  Diagnosis Date  . Hyperlipidemia   . Hypertension   . Diabetes mellitus   . GERD (gastroesophageal reflux disease)   . Cardiomyopathy     non-specific, ejection fraction improved to 40-45%  . Coronary artery disease     nonobstructive  . Premature ventricular contractions    Past Surgical History  Procedure Laterality Date  . Cardiac catheterization  10/25/00, 12/29/02   History reviewed. No pertinent family history. History  Substance Use Topics  . Smoking status: Former Smoker    Types: Cigarettes  . Smokeless tobacco: Not on file     Comment: quit 35 yrs ago  . Alcohol Use: Not on file    Review of Systems ROS reviewed and all otherwise negative except for mentioned in HPI    Allergies  Review of patient's allergies indicates no known allergies.  Home Medications   Prior to Admission medications   Medication Sig Start Date End Date Taking? Authorizing Provider  aspirin 81 MG tablet Take 81 mg by mouth daily.     Yes Historical Provider, MD  atorvastatin (LIPITOR) 80 MG tablet Take 80 mg by mouth daily.   Yes Historical Provider, MD  carbamazepine (TEGRETOL) 100 MG  chewable tablet Chew 100 mg by mouth 2 (two) times daily.   Yes Historical Provider, MD  Coenzyme Q10 (COQ10) 200 MG CAPS Take 1 capsule by mouth daily.   Yes Historical Provider, MD  enalapril (VASOTEC) 5 MG tablet Take 5 mg by mouth 2 (two) times daily.   Yes Historical Provider, MD  glimepiride (AMARYL) 2 MG tablet Take 4 mg by mouth daily before breakfast.    Yes Historical Provider, MD  hydrochlorothiazide 25 MG tablet Take 25 mg by mouth daily.     Yes Historical Provider, MD  metFORMIN (GLUCOPHAGE) 850 MG tablet Take 850 mg by mouth 2 (two) times daily with a meal.   Yes Historical Provider, MD  metoprolol (LOPRESSOR) 50 MG tablet Take 1.5 tablets (75 mg total) by mouth 2 (two) times daily. 09/08/13  Yes [name], MD  omeprazole (PRILOSEC) 10 MG capsule Take 20 mg by mouth daily.   Yes Historical Provider, MD  tamsulosin (FLOMAX) 0.4 MG CAPS capsule Take 0.4 mg by mouth at bedtime.   Yes Historical Provider, MD   BP 136/67  Pulse 138  Resp 16  Ht 5\' 11"  (1.803 m)  Wt 192 lb (87.091 kg)  BMI 26.79 kg/m2  SpO2 95% Vitals reviewed Physical Exam Physical Examination: General appearance - alert, well appearing, and in no distress Mental status - alert, oriented to person, place, and time Eyes - no conjunctival injection, no scleral icterus Mouth - mucous membranes moist, pharynx  normal without lesions Chest - clear to auscultation, no wheezes, rales or rhonchi, symmetric air entry Heart - rapid heart rate, regular rhythm, normal S1, S2, no murmurs, rubs, clicks or gallops Abdomen - soft, nontender, nondistended, no masses or organomegaly Extremities - peripheral pulses normal, no pedal edema, no clubbing or cyanosis Skin - normal coloration and turgor, no rashes  ED Course  Procedures (including critical care time)  11:23 PM d/w Dr. Delane Ginger, cardiology- he will admit patient, requests transfer to [facility]- agrees with dilitazem drip and advises starting heparin drip.      CRITICAL CARE Performed by: Threasa Beards Total critical care time: 40 Critical care time was exclusive of separately billable procedures and treating other patients. Critical care was necessary to treat or prevent imminent or life-threatening deterioration. Critical care was time spent personally by me on the following activities: development of treatment plan with patient and/or surrogate as well as nursing, discussions with consultants, evaluation of patient's response to treatment, examination of patient, obtaining history from patient or surrogate, ordering and performing treatments and interventions, ordering and review of laboratory studies, ordering and review of radiographic studies, pulse oximetry and re-evaluation of patient's condition. Labs Review Labs Reviewed  BASIC METABOLIC PANEL - Abnormal; Notable for the following:    Glucose, Bld 213 (*)    Creatinine, Ser 1.90 (*)    GFR calc non Af Amer 33 (*)    GFR calc Af Amer 38 (*)    All other components within normal limits  D-DIMER, QUANTITATIVE - Abnormal; Notable for the following:    D-Dimer, Quant 0.65 (*)    All other components within normal limits  CBC  TSH  APTT  PROTIME-INR  Randolm Idol, ED    Imaging Review Dg Chest Port 1 View  09/21/2013   CLINICAL DATA:  Chest pain.  EXAM: PORTABLE CHEST - 1 VIEW  COMPARISON:  DG CHEST 2 VIEW dated 08/28/2007  FINDINGS: Cardiac silhouette is unremarkable. Mildly calcified aortic knob. Persistent mildly elevated right hemidiaphragm. No pleural effusions or focal consolidation; left costophrenic angle incompletely imaged. No pneumothorax.  Interval left humeral arthroplasty. Multiple EKG lines overlie the patient and may obscure subtle underlying pathology. Moderate degenerative change of the thoracic spine.  IMPRESSION: No acute cardiopulmonary process.   Electronically Signed   By: Elon Alas   On: 09/21/2013 22:58     EKG Interpretation   Date/Time:   Tuesday September 21 2013 21:55:27 EDT Ventricular Rate:  166 PR Interval:    QRS Duration: 96 QT Interval:  293 QTC Calculation: 487 R Axis:   27 Text Interpretation:  Atrial fibrillation with rapid V-rate Repolarization  abnormality, prob rate related Since previous tracing atrial fibrillation  is new Confirmed by Canary Brim  MD, [place] [phone]) on 09/21/2013 10:30:32 PM      MDM   Final diagnoses:  Atrial fibrillation with rapid ventricular response    Pt presenting with palpitations nad chest pressure, on arrival HR 170s- afib with rvr on ekg and monitor.  Pt awake and alert, strong pulses.  Started on diltiazem drip, HR decreased to 130s, maintaining blood pressure.  Drip increased from 5 to 15.  D/w cardiology and patient will be admitted to [facility], telemetry bed.  Heparin drip ordered as well.  Troponin negative.  Pt and family at bedside advised of plan for admission.      Threasa Beards, MD 09/21/13 [phone]

## 2013-09-21 NOTE — Progress Notes (Signed)
ANTICOAGULATION CONSULT NOTE - Initial Consult  Pharmacy Consult for Heparin Indication: atrial fibrillation  No Known Allergies  Patient Measurements: Height: 5\' 11"  (180.3 cm) Weight: 192 lb (87.091 kg) IBW/kg (Calculated) : 75.3 Heparin Dosing Weight: 79 kg  Vital Signs: BP: 136/67 mmHg (04/28 2330) Pulse Rate: 138 (04/28 2330)  Labs:  Recent Labs  09/21/13 2205  HGB 14.8  HCT 43.1  PLT 163  CREATININE 1.90*    Estimated Creatinine Clearance: 36.3 ml/min (by C-G formula based on Cr of 1.9).   Medical History: Past Medical History  Diagnosis Date  . Hyperlipidemia   . Hypertension   . Diabetes mellitus   . GERD (gastroesophageal reflux disease)   . Cardiomyopathy     non-specific, ejection fraction improved to 40-45%  . Coronary artery disease     nonobstructive  . Premature ventricular contractions     Medications:  Scheduled:   Infusions:  . diltiazem (CARDIZEM) infusion 15 mg/hr (09/21/13 2303)  . heparin     Followed by  . heparin      Assessment: 73 yo c/o CP after push mowing his lawn today.  Pt found to be in A-fib and IV heparin per Rx ordered.  Goal of Therapy:  Heparin level 0.3-0.7 units/ml Monitor platelets by anticoagulation protocol: Yes   Plan:   Baseline coags stat  Heparin 4000 unit bolus IV x1  Start drip @ 1100 units/hr  Daily CBC/HL  Check 1st HL in 8 hours  Dorrene German 09/21/2013,11:36 PM

## 2013-09-22 ENCOUNTER — Observation Stay (HOSPITAL_COMMUNITY): Payer: Medicare Other

## 2013-09-22 DIAGNOSIS — I4891 Unspecified atrial fibrillation: Principal | ICD-10-CM

## 2013-09-22 DIAGNOSIS — I517 Cardiomegaly: Secondary | ICD-10-CM

## 2013-09-22 LAB — GLUCOSE, CAPILLARY
Glucose-Capillary: 224 mg/dL — ABNORMAL HIGH (ref 70–99)
Glucose-Capillary: 161 mg/dL — ABNORMAL HIGH (ref 70–99)
Glucose-Capillary: 159 mg/dL — ABNORMAL HIGH (ref 70–99)
Glucose-Capillary: 103 mg/dL — ABNORMAL HIGH (ref 70–99)
Glucose-Capillary: 135 mg/dL — ABNORMAL HIGH (ref 70–99)

## 2013-09-22 LAB — CBC
HCT: 43.5 % (ref 39.0–52.0)
HEMOGLOBIN: 15.1 g/dL (ref 13.0–17.0)
MCH: 31.1 pg (ref 26.0–34.0)
MCHC: 34.7 g/dL (ref 30.0–36.0)
MCV: 89.7 fL (ref 78.0–100.0)
Platelets: 187 10*3/uL (ref 150–400)
RBC: 4.85 MIL/uL (ref 4.22–5.81)
RDW: 13.7 % (ref 11.5–15.5)
WBC: 9.5 10*3/uL (ref 4.0–10.5)

## 2013-09-22 LAB — TSH
TSH: 3.68 u[IU]/mL (ref 0.350–4.500)
TSH: 4.11 u[IU]/mL (ref 0.350–4.500)

## 2013-09-22 LAB — HEPARIN LEVEL (UNFRACTIONATED): Heparin Unfractionated: 0.58 IU/mL (ref 0.30–0.70)

## 2013-09-22 MED ORDER — PANTOPRAZOLE SODIUM 40 MG PO TBEC
40.0000 mg | DELAYED_RELEASE_TABLET | Freq: Every day | ORAL | Status: DC
  Administered 2013-09-22 – 2013-09-24 (×3): 40 mg via ORAL
  Filled 2013-09-22 (×2): qty 1

## 2013-09-22 MED ORDER — ATORVASTATIN CALCIUM 80 MG PO TABS
80.0000 mg | ORAL_TABLET | Freq: Every day | ORAL | Status: DC
  Administered 2013-09-22 – 2013-09-24 (×3): 80 mg via ORAL
  Filled 2013-09-22 (×3): qty 1

## 2013-09-22 MED ORDER — INSULIN ASPART 100 UNIT/ML ~~LOC~~ SOLN
0.0000 [IU] | Freq: Three times a day (TID) | SUBCUTANEOUS | Status: DC
  Administered 2013-09-22 (×2): 2 [IU] via SUBCUTANEOUS
  Administered 2013-09-23 (×2): 1 [IU] via SUBCUTANEOUS
  Administered 2013-09-24: 3 [IU] via SUBCUTANEOUS
  Administered 2013-09-24 (×2): 2 [IU] via SUBCUTANEOUS

## 2013-09-22 MED ORDER — CARBAMAZEPINE 100 MG PO CHEW
100.0000 mg | CHEWABLE_TABLET | Freq: Two times a day (BID) | ORAL | Status: DC
  Administered 2013-09-22: 100 mg via ORAL
  Filled 2013-09-22 (×5): qty 1

## 2013-09-22 MED ORDER — DILTIAZEM HCL 100 MG IV SOLR
5.0000 mg/h | INTRAVENOUS | Status: DC
  Administered 2013-09-22: 15 mg/h via INTRAVENOUS
  Filled 2013-09-22: qty 100

## 2013-09-22 MED ORDER — TECHNETIUM TO 99M ALBUMIN AGGREGATED
6.0000 | Freq: Once | INTRAVENOUS | Status: AC | PRN
  Administered 2013-09-22: 6 via INTRAVENOUS

## 2013-09-22 MED ORDER — ASPIRIN 81 MG PO CHEW
81.0000 mg | CHEWABLE_TABLET | Freq: Every day | ORAL | Status: DC
  Filled 2013-09-22: qty 1

## 2013-09-22 MED ORDER — TAMSULOSIN HCL 0.4 MG PO CAPS
0.4000 mg | ORAL_CAPSULE | Freq: Every day | ORAL | Status: DC
  Administered 2013-09-22 – 2013-09-23 (×2): 0.4 mg via ORAL
  Filled 2013-09-22 (×3): qty 1

## 2013-09-22 MED ORDER — INSULIN ASPART 100 UNIT/ML ~~LOC~~ SOLN: 0.0000 [IU] | Freq: Every day | SUBCUTANEOUS | Status: DC

## 2013-09-22 MED ORDER — TECHNETIUM TC 99M DIETHYLENETRIAME-PENTAACETIC ACID: 40.0000 | Freq: Once | INTRAVENOUS | Status: AC | PRN

## 2013-09-22 MED ORDER — HYDROCHLOROTHIAZIDE 25 MG PO TABS
25.0000 mg | ORAL_TABLET | Freq: Every day | ORAL | Status: DC
  Administered 2013-09-22: 25 mg via ORAL
  Filled 2013-09-22 (×2): qty 1

## 2013-09-22 MED ORDER — METOPROLOL TARTRATE 50 MG PO TABS
75.0000 mg | ORAL_TABLET | Freq: Two times a day (BID) | ORAL | Status: DC
  Administered 2013-09-22 – 2013-09-24 (×6): 75 mg via ORAL
  Filled 2013-09-22 (×7): qty 1

## 2013-09-22 MED ORDER — RIVAROXABAN 15 MG PO TABS
15.0000 mg | ORAL_TABLET | ORAL | Status: AC
  Administered 2013-09-22: 15 mg via ORAL
  Filled 2013-09-22: qty 1

## 2013-09-22 MED ORDER — RIVAROXABAN 15 MG PO TABS
15.0000 mg | ORAL_TABLET | Freq: Every day | ORAL | Status: DC
  Administered 2013-09-23 – 2013-09-24 (×2): 15 mg via ORAL
  Filled 2013-09-22 (×2): qty 1

## 2013-09-22 MED ORDER — ENALAPRIL MALEATE 5 MG PO TABS
5.0000 mg | ORAL_TABLET | Freq: Two times a day (BID) | ORAL | Status: DC
  Administered 2013-09-22 (×3): 5 mg via ORAL
  Filled 2013-09-22 (×5): qty 1

## 2013-09-22 NOTE — ED Notes (Signed)
[facility] here to transport pt to [facility]. 

## 2013-09-22 NOTE — H&P (Signed)
History and Physical  Patient ID: Erik [name] MRN: [id number], DOB: 08-19-1940 Date of Encounter: 09/22/2013, 2:13 AM Primary Physician: [name], MD Primary Cardiologist:   Chief Complaint: palpitations  Reason for Admission: afib   HPI: 37 old male with hx of PVC's , CMP ( Ef now recovered per last echo in 2010) , HTN , DM , HLD , cath in 2004 showing non obstructive CAD here for Afib with RVR   Pt states that earlier today he started experiencing substernal chest pressure radiating to his shoulders . He checked his BP which was wnl but his pulse was erratic which is why he came in to the ER. Currently he still has these symptoms but more mild. Appears anxious as well. Denies any such previous episodes. In the ED was found to be in Afib with RVR and started on diltiazem gtt and heparin gtt.  Pt denies any SOB , orthopnea, PND , LE edema , Syncope ,claudcation , focal weakness, or bleeding diathesis .  No h/o thyroid issues, recreational drug use or alcohol use    Past Medical History  Diagnosis Date  . Hyperlipidemia   . Hypertension   . Diabetes mellitus   . GERD (gastroesophageal reflux disease)   . Cardiomyopathy     non-specific, ejection fraction improved to 40-45%  . Coronary artery disease     nonobstructive  . Premature ventricular contractions      Most Recent Cardiac Studies:  Echo 09/2008 1. Left ventricle: The cavity size was at the upper limits of normal. Wall thickness was normal. The estimated ejection fraction was 55%. Regional wall motion abnormalities cannot be excluded. Doppler parameters are consistent with abnormal left ventricular relaxation (grade 1 diastolic dysfunction). 2. Mitral valve: Mild regurgitation. 3. Left atrium: The atrium was mildly dilated. Echocardiography. M-mode, complete 2D, spectral Doppler, and color Doppler. Height: Height: 180.3cm. Height: 71in. Weight: Weight: 98.4kg. Weight: 216.5lb. Body mass index: BMI:  30.3kg/m^2. Body surface area: BSA: 2.70m^2. Patient status: Outpatient.  Echo 07/2007 LEFT VENTRICLE: - Left ventricular size was normal. - Overall left ventricular systolic function was mildly to moderately decreased. - Left ventricular ejection fraction was estimated , range being 35 % to 45 %. - There was moderate diffuse left ventricular hypokinesis. - Left ventricular wall thickness was mildly increased.      Surgical History:  Past Surgical History  Procedure Laterality Date  . Cardiac catheterization  10/25/00, 12/29/02     Home Meds: Prior to Admission medications   Medication Sig Start Date End Date Taking? Authorizing Provider  aspirin 81 MG tablet Take 81 mg by mouth daily.     Yes Historical Provider, MD  atorvastatin (LIPITOR) 80 MG tablet Take 80 mg by mouth daily.   Yes Historical Provider, MD  carbamazepine (TEGRETOL) 100 MG chewable tablet Chew 100 mg by mouth 2 (two) times daily.   Yes Historical Provider, MD  Coenzyme Q10 (COQ10) 200 MG CAPS Take 1 capsule by mouth daily.   Yes Historical Provider, MD  enalapril (VASOTEC) 5 MG tablet Take 5 mg by mouth 2 (two) times daily.   Yes Historical Provider, MD  glimepiride (AMARYL) 2 MG tablet Take 4 mg by mouth daily before breakfast.    Yes Historical Provider, MD  hydrochlorothiazide 25 MG tablet Take 25 mg by mouth daily.     Yes Historical Provider, MD  metFORMIN (GLUCOPHAGE) 850 MG tablet Take 850 mg by mouth 2 (two) times daily with a meal.   Yes Historical  Provider, MD  metoprolol (LOPRESSOR) 50 MG tablet Take 1.5 tablets (75 mg total) by mouth 2 (two) times daily. 09/08/13  Yes [name], MD  omeprazole (PRILOSEC) 10 MG capsule Take 20 mg by mouth daily.   Yes Historical Provider, MD  tamsulosin (FLOMAX) 0.4 MG CAPS capsule Take 0.4 mg by mouth at bedtime.   Yes Historical Provider, MD    Allergies: No Known Allergies  History   Social History  . Marital Status: Married    Spouse Name: N/A     Number of Children: N/A  . Years of Education: N/A   Occupational History  . retired     from [employer]   Social History Main Topics  . Smoking status: Former Smoker    Types: Cigarettes  . Smokeless tobacco: Not on file     Comment: quit 35 yrs ago  . Alcohol Use: Not on file  . Drug Use: Not on file  . Sexual Activity: Not on file   Other Topics Concern  . Not on file   Social History Narrative  . No narrative on file     History reviewed. No pertinent family history.  Review of Systems: General: negative for chills, fever, night sweats or weight changes.  Cardiovascular: see HPI  Dermatological: negative for rash Respiratory: negative for cough or wheezing Urologic: negative for hematuria Abdominal: negative for nausea, vomiting, diarrhea, bright red blood per rectum, melena, or hematemesis Neurologic: negative for visual changes, syncope, or dizziness All other systems reviewed and are otherwise negative except as noted above.  Labs:   Lab Results  Component Value Date   WBC 7.3 09/21/2013   HGB 14.8 09/21/2013   HCT 43.1 09/21/2013   MCV 87.8 09/21/2013   PLT 163 09/21/2013    Recent Labs Lab 09/21/13 2205  NA 141  K 4.0  CL 100  CO2 25  BUN 20  CREATININE 1.90*  CALCIUM 8.7  GLUCOSE 213*   No results found for this basename: CKTOTAL, CKMB, TROPONINI,  in the last 72 hours No results found for this basename: CHOL, HDL, LDLCALC, TRIG   Lab Results  Component Value Date   DDIMER 0.65* 09/21/2013    Radiology/Studies:  Dg Chest Port 1 View  09/21/2013   CLINICAL DATA:  Chest pain.  EXAM: PORTABLE CHEST - 1 VIEW  COMPARISON:  DG CHEST 2 VIEW dated 08/28/2007  FINDINGS: Cardiac silhouette is unremarkable. Mildly calcified aortic knob. Persistent mildly elevated right hemidiaphragm. No pleural effusions or focal consolidation; left costophrenic angle incompletely imaged. No pneumothorax.  Interval left humeral arthroplasty. Multiple EKG lines overlie the  patient and may obscure subtle underlying pathology. Moderate degenerative change of the thoracic spine.  IMPRESSION: No acute cardiopulmonary process.   Electronically Signed   By: Elon Alas   On: 09/21/2013 22:58     EKG: 09/21/2012 Afib with RVR and mild repolarization abn   Physical Exam: Blood pressure 132/70, pulse 137, temperature 98.4 F (36.9 C), temperature source Oral, resp. rate 18, height 5\' 11"  (1.803 m), weight 88.1 kg (194 lb 3.6 oz), SpO2 94.00%. General: Well developed, well nourished, in no acute distress. Neck: Negative for carotid bruits. JVD not elevated. Lungs: Clear bilaterally to auscultation without wheezes, rales, or rhonchi. Breathing is unlabored. Heart: irregular  No murmurs, rubs, or gallops appreciated. Abdomen: Soft, non-tender, non-distended with normoactive bowel sounds. No hepatomegaly. No rebound/guarding. No obvious abdominal masses. Msk:  Strength and tone appear normal for age. Extremities: No clubbing or cyanosis.  No edema.  Distal pedal pulses are 2+ and equal bilaterally. Neuro: Alert and oriented X 3. No focal deficit. No facial asymmetry. Moves all extremities spontaneously. Psych:  Appears anxious     ASSESSMENT AND PLAN:  Afib with RVR  CAD - non obstructive  CKD  DM type II    Plan  Check TSH, lytes, Echo  Rule out ACS , monitor on tele  Cont Dilt for rate control  CHADS2 score is atleast 2 would benefit from long term anticoagulation , currently on heparin gtt  NPO for possible cardioversion in am for new onset Afib  Hold oral hypoglyemics and start SSI      Signed, Grafton Folk M.D   09/22/2013, 2:13 AM

## 2013-09-22 NOTE — Progress Notes (Signed)
UR Completed.  Pearson Reasons Jane Mabel Roll [phone] 09/22/2013  

## 2013-09-22 NOTE — Progress Notes (Signed)
Pt HR now sustaining in 50's.  Pt dropping into 40's nonsustained.  Cardizem dripped stopped.  Md on call notified and is aware.  No new orders received.  Will continue to monitor pt closely.  Call bell within reach.

## 2013-09-22 NOTE — ED Notes (Signed)
[facility] notified of pt's transfer to [facility].

## 2013-09-22 NOTE — ED Notes (Signed)
[facility] was given report on pt. 

## 2013-09-22 NOTE — Progress Notes (Signed)
Inpatient Diabetes Program Recommendations  AACE/ADA: New Consensus Statement on Inpatient Glycemic Control (2013)  Target Ranges:  Prepandial:   less than 140 mg/dL      Peak postprandial:   less than 180 mg/dL (1-2 hours)      Critically ill patients:  140 - 180 mg/dL   Results for JAYKO, QUIGG (MRN [id number]) as of 09/22/2013 12:17  Ref. Range 09/22/2013 01:55 09/22/2013 06:11 09/22/2013 11:14  Glucose-Capillary Latest Range: 70-99 mg/dL 224 (H) 161 (H) 159 (H)   Diabetes history: DM2 Outpatient Diabetes medications: Amaryl 4 mg QAM, Metformin 850 mg BID Current orders for Inpatient glycemic control: Novolog 0-9 units AC, Novolog 0-5 units HS  Inpatient Diabetes Program Recommendations HgbA1C: Please consider ordering an A1C to evaluate glycemic control over the past 2-3 months.  Thanks, Barnie Alderman, RN, MSN, CCRN Diabetes Coordinator Inpatient Diabetes Program [phone] (Team Pager) [phone] ([facility] office) [phone] [facility] office)

## 2013-09-22 NOTE — Consult Note (Signed)
PHARMACY CONSULT NOTE  Pharmacy Consult :  Xarelto Indication : Non-valvular atrial fibrillation   Allergies: No Known Allergies  Dosing weight : 88 kg  Vital Signs: BP 106/61  Pulse 61  Temp(Src) 97.3 F (36.3 C) (Oral)  Resp 17  Ht 5\' 11"  (1.803 m)  Wt 194 lb 3.6 oz (88.1 kg)  BMI 27.10 kg/m2  SpO2 96%  Active Problems: Active Problems:   Atrial fibrillation with rapid ventricular response   Atrial fibrillation   Labs:  Recent Labs  09/21/13 2205 09/22/13 0705  HGB 14.8 15.1  HCT 43.1 43.5  PLT 163 187  APTT 27  --   LABPROT 12.8  --   INR 0.98  --   HEPARINUNFRC  --  0.58  CREATININE 1.90*  --    Lab Results  Component Value Date   INR 0.98 09/21/2013   INR 1.1 08/26/2007   Estimated Creatinine Clearance: 36.3 ml/min (by C-G formula based on Cr of 1.9).  Medical / Surgical History: Past Medical History  Diagnosis Date  . Hyperlipidemia   . Hypertension   . Diabetes mellitus   . GERD (gastroesophageal reflux disease)   . Cardiomyopathy     non-specific, ejection fraction improved to 40-45%  . Coronary artery disease     nonobstructive  . Premature ventricular contractions    Past Surgical History  Procedure Laterality Date  . Cardiac catheterization  10/25/00, 12/29/02    Current Medication[s] Include: Medication PTA: Prescriptions prior to admission  Medication Sig Dispense Refill  . aspirin 81 MG tablet Take 81 mg by mouth daily.        [name] atorvastatin (LIPITOR) 80 MG tablet Take 80 mg by mouth daily.      . carbamazepine (TEGRETOL) 100 MG chewable tablet Chew 100 mg by mouth 2 (two) times daily.      . Coenzyme Q10 (COQ10) 200 MG CAPS Take 1 capsule by mouth daily.      . enalapril (VASOTEC) 5 MG tablet Take 5 mg by mouth 2 (two) times daily.      [name] glimepiride (AMARYL) 2 MG tablet Take 4 mg by mouth daily before breakfast.       . hydrochlorothiazide 25 MG tablet Take 25 mg by mouth daily.        . metFORMIN (GLUCOPHAGE) 850 MG tablet Take  850 mg by mouth 2 (two) times daily with a meal.      . metoprolol (LOPRESSOR) 50 MG tablet Take 1.5 tablets (75 mg total) by mouth 2 (two) times daily.  270 tablet  0  . omeprazole (PRILOSEC) 10 MG capsule Take 20 mg by mouth daily.      . tamsulosin (FLOMAX) 0.4 MG CAPS capsule Take 0.4 mg by mouth at bedtime.        Scheduled:  Scheduled:  . atorvastatin  80 mg Oral Daily  . carbamazepine  100 mg Oral BID  . enalapril  5 mg Oral BID  . hydrochlorothiazide  25 mg Oral Daily  . insulin aspart  0-5 Units Subcutaneous QHS  . insulin aspart  0-9 Units Subcutaneous TID WC  . metoprolol  75 mg Oral BID  . pantoprazole  40 mg Oral Daily  . tamsulosin  0.4 mg Oral QHS   Infusion[s]: Infusions:   Antibiotic[s]: Anti-infectives   None      Assessment:  72 y.o.male w/new onset of non-valvular atrial fibrillation w/RVR who will be placed on Xarelto for VTE prophylaxis   Heparin infusion has been  discontinued .  Patient does have an estimated CrCl of < 50 ml/min, 36 ml/min with SCr of 1.9 which requires dosage adjustment.  Patient states he is on Carbamazepine for facial nerve pain which he is not planning to continue after the next 4-5 days.  Carbamazepine is known to increase the metabolism of the Direct Xa Inhibitors, thereby reducing the effectiveness.  If he were to continue Carbamazepine, then an alternative agent for VTE prophylaxis should be considered.  Goal of Therapy:  Xarelto for non-valvular atrial fibrillation dose adjusted for renal function.   Plan:  1. Stop Heparin infusion.  [Done.]. 2. Insure discontinuation of Carbamazepine [planned as per patient interview]. 3. Xarelto 15 mg po daily with supper.  Dose has been reduced due to renal function.  Conlin Brahm, Craig Guess,  Pharm.D.. 09/22/2013,  10:51 AM

## 2013-09-22 NOTE — Progress Notes (Signed)
  Echocardiogram 2D Echocardiogram has been performed.  Erik [name] 09/22/2013, 10:48 AM

## 2013-09-22 NOTE — Progress Notes (Signed)
Patient seen and examined and agree with H&P by Dr. Delane Ginger.  Admitted with afib with RVR and has now converted to NSR.  He also had CP during the afib with RVR.  He has a history of nonobstructive ASCAD by cath 2004 and LV dysfunction that has resolved with EF 55% on echo 2010.  Will start Xarelto 20mg  daily for CHADS2 score of 3 and d/c Heparin gtt.  Check 2D echo to reasses LVF and LA size.  Stop ASA.  He had about an hour of chest pain during the afib like an elephant sitting on his chest.  Initial troponin is normal.  Will cycle for 3 sets.  If troponin remains normal and LVF is normal on echo then will make NPO after MN for Lexiscan myoview in am.  His D-Dimer is elevated so will get VQ scan to rule out PE.  He has underlying renal failure with Creat 1.9 so need to avoid contrast.  Will hold on starting on PO Cardizem due to borderline low BP.  He is off the Cardizem gtt due to transient bradycardia last night while in afib.

## 2013-09-23 DIAGNOSIS — I428 Other cardiomyopathies: Secondary | ICD-10-CM

## 2013-09-23 DIAGNOSIS — R079 Chest pain, unspecified: Secondary | ICD-10-CM

## 2013-09-23 DIAGNOSIS — E785 Hyperlipidemia, unspecified: Secondary | ICD-10-CM

## 2013-09-23 DIAGNOSIS — I10 Essential (primary) hypertension: Secondary | ICD-10-CM

## 2013-09-23 DIAGNOSIS — I2510 Atherosclerotic heart disease of native coronary artery without angina pectoris: Secondary | ICD-10-CM

## 2013-09-23 DIAGNOSIS — E1129 Type 2 diabetes mellitus with other diabetic kidney complication: Secondary | ICD-10-CM

## 2013-09-23 HISTORY — DX: Chest pain, unspecified: R07.9

## 2013-09-23 LAB — CBC
HCT: 39.6 % (ref 39.0–52.0)
Hemoglobin: 13.5 g/dL (ref 13.0–17.0)
MCH: 30.7 pg (ref 26.0–34.0)
MCHC: 34.1 g/dL (ref 30.0–36.0)
MCV: 90 fL (ref 78.0–100.0)
Platelets: 125 10*3/uL — ABNORMAL LOW (ref 150–400)
RBC: 4.4 MIL/uL (ref 4.22–5.81)
RDW: 13.8 % (ref 11.5–15.5)
WBC: 4.7 10*3/uL (ref 4.0–10.5)

## 2013-09-23 LAB — TROPONIN I
Troponin I: <0.3 ng/mL (ref <0.30)
Troponin I: <0.3 ng/mL (ref <0.30)
Troponin I: <0.3 ng/mL (ref <0.30)

## 2013-09-23 LAB — GLUCOSE, CAPILLARY
GLUCOSE-CAPILLARY: 119 mg/dL — AB (ref 70–99)
GLUCOSE-CAPILLARY: 140 mg/dL — AB (ref 70–99)
GLUCOSE-CAPILLARY: 146 mg/dL — AB (ref 70–99)
GLUCOSE-CAPILLARY: 191 mg/dL — AB (ref 70–99)

## 2013-09-23 LAB — HEMOGLOBIN A1C
Hgb A1c MFr Bld: 6.8 % — ABNORMAL HIGH (ref <5.7)
MEAN PLASMA GLUCOSE: 148 mg/dL — AB (ref <117)

## 2013-09-23 MED ORDER — GLIMEPIRIDE 4 MG PO TABS: 4.0000 mg | ORAL_TABLET | Freq: Every day | ORAL | Status: DC

## 2013-09-23 MED ORDER — METFORMIN HCL 850 MG PO TABS: 850.0000 mg | ORAL_TABLET | Freq: Two times a day (BID) | ORAL | Status: DC

## 2013-09-23 NOTE — Progress Notes (Signed)
SUBJECTIVE:  No compliants  OBJECTIVE:   Vitals:   Filed Vitals:   09/22/13 1101 09/22/13 1330 09/22/13 2104 09/23/13 0424  BP: 110/60 112/62 137/57 121/54  Pulse:  65 65 67  Temp:  97.7 F (36.5 C) 98 F (36.7 C) 97.9 F (36.6 C)  TempSrc:  Oral Oral Oral  Resp:  20 18 16   Height:      Weight:      SpO2:  95% 97% 93%   I&O's:   Intake/Output Summary (Last 24 hours) at 09/23/13 0905 Last data filed at 09/22/13 2058  Gross per 24 hour  Intake    939 ml  Output    450 ml  Net    489 ml   TELEMETRY: Reviewed telemetry pt in NSR:     PHYSICAL EXAM General: Well developed, well nourished, in no acute distress Head: Eyes PERRLA, No xanthomas.   Normal cephalic and atramatic  Lungs:   Clear bilaterally to auscultation and percussion. Heart:   HRRR S1 S2 Pulses are 2+ & equal. Abdomen: Bowel sounds are positive, abdomen soft and non-tender without masses  Extremities:   No clubbing, cyanosis or edema.  DP +1 Neuro: Alert and oriented X 3. Psych:  Good affect, responds appropriately   LABS: Basic Metabolic Panel:  Recent Labs  09/21/13 2205  NA 141  K 4.0  CL 100  CO2 25  GLUCOSE 213*  BUN 20  CREATININE 1.90*  CALCIUM 8.7   Liver Function Tests: No results found for this basename: AST, ALT, ALKPHOS, BILITOT, PROT, ALBUMIN,  in the last 72 hours No results found for this basename: LIPASE, AMYLASE,  in the last 72 hours CBC:  Recent Labs  09/22/13 0705 09/23/13 0427  WBC 9.5 4.7  HGB 15.1 13.5  HCT 43.5 39.6  MCV 89.7 90.0  PLT 187 125*   Cardiac Enzymes: No results found for this basename: CKTOTAL, CKMB, CKMBINDEX, TROPONINI,  in the last 72 hours BNP: No components found with this basename: POCBNP,  D-Dimer:  Recent Labs  09/21/13 2205  DDIMER 0.65*   Hemoglobin A1C: No results found for this basename: HGBA1C,  in the last 72 hours Fasting Lipid Panel: No results found for this basename: CHOL, HDL, LDLCALC, TRIG, CHOLHDL, LDLDIRECT,  in  the last 72 hours Thyroid Function Tests:  Recent Labs  09/22/13 0705  TSH 4.110   Anemia Panel: No results found for this basename: VITAMINB12, FOLATE, FERRITIN, TIBC, IRON, RETICCTPCT,  in the last 72 hours Coag Panel:   Lab Results  Component Value Date   INR 0.98 09/21/2013   INR 1.1 08/26/2007    RADIOLOGY: Nm Pulmonary Perf And Vent  09/22/2013   CLINICAL DATA:  Chest pain, elevated D-dimer, renal insufficiency  EXAM: NUCLEAR MEDICINE VENTILATION - PERFUSION LUNG SCAN  TECHNIQUE: Ventilation images were obtained in multiple projections using inhaled aerosol technetium 99 M DTPA. Perfusion images were obtained in multiple projections after intravenous injection of Tc-45m MAA.  RADIOPHARMACEUTICALS:  40 mCi Tc-29m DTPA aerosol and 6 mCi Tc-26m MAA  COMPARISON:  None; correlation chest radiograph 09/21/2013  FINDINGS: Ventilation: Small amount of central airway deposition at the LEFT hilum. Swallowed aerosol within stomach. Ventilation defect at posterior basilar LEFT lower lobe.  Perfusion: Matching perfusion defect at posterior basilar LEFT lower lobe. No other perfusion defects identified.  Chest radiograph: No acute infiltrate. Mild elevation of RIGHT diaphragm.  IMPRESSION: Matching perfusion and ventilation defects in posterior basilar LEFT lower lobe.  Low probability pulmonary embolism.  Electronically Signed   By: Lavonia Dana M.D.   On: 09/22/2013 14:49   Dg Chest Port 1 View  09/21/2013   CLINICAL DATA:  Chest pain.  EXAM: PORTABLE CHEST - 1 VIEW  COMPARISON:  DG CHEST 2 VIEW dated 08/28/2007  FINDINGS: Cardiac silhouette is unremarkable. Mildly calcified aortic knob. Persistent mildly elevated right hemidiaphragm. No pleural effusions or focal consolidation; left costophrenic angle incompletely imaged. No pneumothorax.  Interval left humeral arthroplasty. Multiple EKG lines overlie the patient and may obscure subtle underlying pathology. Moderate degenerative change of the thoracic  spine.  IMPRESSION: No acute cardiopulmonary process.   Electronically Signed   By: Elon Alas   On: 09/21/2013 22:58      ASSESSMENT:  1.  Chest pain with negative troponin x1.  VQ scan low probability for PE.  2D echo now shows a decline in EF at 35% to40% with severe hypokinesis of the basal-midinferolateral and inferior myocardium. Echo in 2009 showed a similar EF but echo in 2010 showed EF at 55%.  He has a known history of nonobstructive ASCAD by cath in 2004.  He has CKD with creatinine at 1.9 so will not plan cath at this time.  Will get a Lexiscan myoview to assess for ischemia and plan cath only if large territory of myocardium at risk from ischemia.  He had a VQ scan yesterday so he will not be able to get his nuclear stress test until tomorrow. 2.  Atrial fibrillation with RVR now in NSR 3.  HTN - controlled 4.  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  DM - check HBA1C at request of Diabetes coordinator 7.  Dyslipidemia   PLAN:   1.  Check HbA1C 2.  Continue Xarelto/metoprolol for PAF 3.  NPO after MN 4.  Lexiscan Myoview in am to assess for ischemia 5.  Continue statin 6.  Stop Vasotec/HCTZ due to renal insuff and follow BP - may need to increase beta blocker or add Amlodipine if BP increases 7.  Restart Amaryl and Metformin at discharge  D/C home tomorrow if nuclear stress test normal  Sueanne Margarita, MD  09/23/2013  9:05 AM

## 2013-09-23 NOTE — Discharge Instructions (Signed)

## 2013-09-23 NOTE — Care Management Note (Signed)
    Page 1 of 1   09/24/2013     3:49:45 PM CARE MANAGEMENT NOTE 09/24/2013  Patient:  Erik [name], Erik [name]   Account Number:  [id number]  Date Initiated:  09/23/2013  Documentation initiated by:  Deania Siguenza  Subjective/Objective Assessment:   Pt adm on 09/21/13 with AFib with RVR.  PTA, pt independent, lives with spouse.     Action/Plan:   Will follow for dc needs as pt progresses.   Anticipated DC Date:  09/24/2013   Anticipated DC Plan:  [place]  CM consult      Choice offered to / List presented to:             Status of service:  In process, will continue to follow Medicare Important Message given?  YES (If response is "NO", the following Medicare IM given date fields will be blank) Date Medicare IM given:  09/21/2013 Date Additional Medicare IM given:    Discharge Disposition:    Per UR Regulation:  Reviewed for med. necessity/level of care/duration of stay  If discussed at [place] of Stay Meetings, dates discussed:    Comments:  09/24/13 Ellan Lambert, RN, [facility] 30 day free trial card for Xarelto given to pt.

## 2013-09-24 ENCOUNTER — Inpatient Hospital Stay (HOSPITAL_COMMUNITY): Payer: Medicare Other

## 2013-09-24 ENCOUNTER — Encounter (HOSPITAL_COMMUNITY): Payer: Self-pay | Admitting: Physician Assistant

## 2013-09-24 DIAGNOSIS — I10 Essential (primary) hypertension: Secondary | ICD-10-CM | POA: Diagnosis present

## 2013-09-24 DIAGNOSIS — N1832 Chronic kidney disease, stage 3b: Secondary | ICD-10-CM | POA: Diagnosis present

## 2013-09-24 DIAGNOSIS — K219 Gastro-esophageal reflux disease without esophagitis: Secondary | ICD-10-CM | POA: Insufficient documentation

## 2013-09-24 DIAGNOSIS — N183 Chronic kidney disease, stage 3 unspecified: Secondary | ICD-10-CM | POA: Diagnosis present

## 2013-09-24 DIAGNOSIS — I2510 Atherosclerotic heart disease of native coronary artery without angina pectoris: Secondary | ICD-10-CM | POA: Diagnosis present

## 2013-09-24 DIAGNOSIS — E1159 Type 2 diabetes mellitus with other circulatory complications: Secondary | ICD-10-CM | POA: Diagnosis present

## 2013-09-24 DIAGNOSIS — R079 Chest pain, unspecified: Secondary | ICD-10-CM

## 2013-09-24 DIAGNOSIS — G518 Other disorders of facial nerve: Secondary | ICD-10-CM | POA: Insufficient documentation

## 2013-09-24 DIAGNOSIS — I152 Hypertension secondary to endocrine disorders: Secondary | ICD-10-CM | POA: Diagnosis present

## 2013-09-24 DIAGNOSIS — I4891 Unspecified atrial fibrillation: Principal | ICD-10-CM | POA: Diagnosis present

## 2013-09-24 DIAGNOSIS — I480 Paroxysmal atrial fibrillation: Secondary | ICD-10-CM | POA: Diagnosis present

## 2013-09-24 DIAGNOSIS — I493 Ventricular premature depolarization: Secondary | ICD-10-CM | POA: Insufficient documentation

## 2013-09-24 DIAGNOSIS — I428 Other cardiomyopathies: Secondary | ICD-10-CM | POA: Diagnosis present

## 2013-09-24 LAB — GLUCOSE, CAPILLARY
Glucose-Capillary: 155 mg/dL — ABNORMAL HIGH (ref 70–99)
Glucose-Capillary: 241 mg/dL — ABNORMAL HIGH (ref 70–99)
Glucose-Capillary: 175 mg/dL — ABNORMAL HIGH (ref 70–99)

## 2013-09-24 LAB — CBC
HCT: 39.9 % (ref 39.0–52.0)
Hemoglobin: 13.7 g/dL (ref 13.0–17.0)
MCH: 30.5 pg (ref 26.0–34.0)
MCHC: 34.3 g/dL (ref 30.0–36.0)
MCV: 88.9 fL (ref 78.0–100.0)
PLATELETS: 130 10*3/uL — AB (ref 150–400)
RBC: 4.49 MIL/uL (ref 4.22–5.81)
RDW: 13.6 % (ref 11.5–15.5)
WBC: 5.5 10*3/uL (ref 4.0–10.5)

## 2013-09-24 MED ORDER — RIVAROXABAN 15 MG PO TABS: 15.0000 mg | ORAL_TABLET | Freq: Every day | ORAL | Status: DC

## 2013-09-24 MED ORDER — REGADENOSON 0.4 MG/5ML IV SOLN
INTRAVENOUS | Status: AC
  Filled 2013-09-24: qty 5

## 2013-09-24 MED ORDER — HYDRALAZINE HCL 10 MG PO TABS
10.0000 mg | ORAL_TABLET | Freq: Three times a day (TID) | ORAL | Status: DC
  Administered 2013-09-24: 10 mg via ORAL
  Filled 2013-09-24 (×3): qty 1

## 2013-09-24 MED ORDER — METOPROLOL TARTRATE 50 MG PO TABS: 75.0000 mg | ORAL_TABLET | Freq: Two times a day (BID) | ORAL | Status: DC

## 2013-09-24 MED ORDER — TECHNETIUM TC 99M SESTAMIBI GENERIC - CARDIOLITE
30.0000 | Freq: Once | INTRAVENOUS | Status: AC | PRN
  Administered 2013-09-24: 30 via INTRAVENOUS

## 2013-09-24 MED ORDER — TECHNETIUM TC 99M SESTAMIBI GENERIC - CARDIOLITE
10.0000 | Freq: Once | INTRAVENOUS | Status: AC | PRN
  Administered 2013-09-24: 10 via INTRAVENOUS

## 2013-09-24 MED ORDER — REGADENOSON 0.4 MG/5ML IV SOLN
0.4000 mg | Freq: Once | INTRAVENOUS | Status: AC
  Administered 2013-09-24: 0.4 mg via INTRAVENOUS
  Filled 2013-09-24: qty 5

## 2013-09-24 MED ORDER — HYDRALAZINE HCL 10 MG PO TABS: 10.0000 mg | ORAL_TABLET | Freq: Three times a day (TID) | ORAL | Status: DC

## 2013-09-24 NOTE — Progress Notes (Signed)
Subjective: No complaints, no chest pain or SOB  Objective: Vital signs in last 24 hours: Temp:  [97.9 F (36.6 C)-98.2 F (36.8 C)] 97.9 F (36.6 C) (05/01 0508) Pulse Rate:  [65-72] 67 (05/01 0918) Resp:  [18] 18 (05/01 0508) BP: (114-151)/(51-66) 151/51 mmHg (05/01 0918) SpO2:  [96 %-97 %] 96 % (05/01 0508) Weight change:  Last BM Date: 09/23/13 Intake/Output from previous day: +369 04/30 0701 - 05/01 0700 In: 720 [P.O.:720] Out: 351 [Urine:350; Stool:1] Intake/Output this shift:    PE: General:Pleasant affect, NAD Skin:Warm and dry, brisk capillary refill HEENT:normocephalic, sclera clear, mucus membranes moist Heart:S1S2 RRR without murmur, gallup, rub or click Lungs:clear without rales, rhonchi, or wheezes [id number], non tender, + BS, do not palpate liver spleen or masses Ext:no lower ext edema, 2+ pedal pulses, 2+ radial pulses Neuro:alert and oriented, MAE, follows commands, + facial symmetry   Lab Results:  Recent Labs  09/23/13 0427 09/24/13 0355  WBC 4.7 5.5  HGB 13.5 13.7  HCT 39.6 39.9  PLT 125* 130*   BMET  Recent Labs  09/21/13 2205  NA 141  K 4.0  CL 100  CO2 25  GLUCOSE 213*  BUN 20  CREATININE 1.90*  CALCIUM 8.7    Recent Labs  09/23/13 1425 09/23/13 2220  TROPONINI <0.30 <0.30    No results found for this basename: CHOL, HDL, LDLCALC, LDLDIRECT, TRIG, CHOLHDL   Lab Results  Component Value Date   HGBA1C 6.8* 09/23/2013     Lab Results  Component Value Date   TSH 4.110 09/22/2013       Studies/Results: Nm Pulmonary Perf And Vent  09/22/2013   CLINICAL DATA:  Chest pain, elevated D-dimer, renal insufficiency  EXAM: NUCLEAR MEDICINE VENTILATION - PERFUSION LUNG SCAN  TECHNIQUE: Ventilation images were obtained in multiple projections using inhaled aerosol technetium 99 M DTPA. Perfusion images were obtained in multiple projections after intravenous injection of Tc-17m MAA.  RADIOPHARMACEUTICALS:  40 mCi Tc-61m  DTPA aerosol and 6 mCi Tc-59m MAA  COMPARISON:  None; correlation chest radiograph 09/21/2013  FINDINGS: Ventilation: Small amount of central airway deposition at the LEFT hilum. Swallowed aerosol within stomach. Ventilation defect at posterior basilar LEFT lower lobe.  Perfusion: Matching perfusion defect at posterior basilar LEFT lower lobe. No other perfusion defects identified.  Chest radiograph: No acute infiltrate. Mild elevation of RIGHT diaphragm.  IMPRESSION: Matching perfusion and ventilation defects in posterior basilar LEFT lower lobe.  Low probability pulmonary embolism.   Electronically Signed   By: Lavonia Dana M.D.   On: 09/22/2013 14:49    Medications: I have reviewed the patient's current medications. Scheduled Meds: . atorvastatin  80 mg Oral Daily  . insulin aspart  0-5 Units Subcutaneous QHS  . insulin aspart  0-9 Units Subcutaneous TID WC  . metoprolol  75 mg Oral BID  . pantoprazole  40 mg Oral Daily  . regadenoson      . rivaroxaban  15 mg Oral Q supper  . tamsulosin  0.4 mg Oral QHS   Continuous Infusions:  PRN Meds:.  Assessment/Plan: Active Problems:   HYPERLIPIDEMIA   HYPERTENSION   CAD, NATIVE VESSEL   CARDIOMYOPATHY, PRIMARY, DILATED   Type II or unspecified type diabetes mellitus with renal manifestations, not stated as uncontrolled   Essential hypertension, benign   Atrial fibrillation with rapid ventricular response   Chest pain Negative MI, plan for discharge if nuc study negative.   LOS: 3 days  Time spent with pt. :15 minutes. Cecilie Kicks  Nurse Practitioner Certified Pager [phone] or after 5pm and on weekends call [phone] 09/24/2013, 9:30 AM

## 2013-09-24 NOTE — Discharge Summary (Signed)
Discharge Summary   Patient ID: Erik [name] MRN: [id number], DOB/AGE: [birth year] 73 y.o. Admit date: 09/21/2013 D/C date:     09/24/2013  Primary Care Provider: [name], MD Primary Cardiologist: Previously Wall, was arranged to see [facility]  Primary Discharge Diagnoses:  1. Chest pain, likely due to atrial fibrillation 2. Atrial fibrillation initially with RVR then bradycardia, spont conv to NSR 3. HTN 4. CAD  - nonobstructive CAD by cath 2004 - nuc stress test this admission: no ischemia, fixed defect suggestive of possible prior infarction, felt low risk, EF 48% 5. Nonischemic cardiomyopathy - prior EF 35-40% in 2009, 55% in 2010, this adm - 35-40% by echo and 48% by nuclear stress test 6. Diabetes mellitus 7. Dyslipidemia 8. CKD stage III  Secondary Discharge Diagnoses:  1. H/o PVCs 2. GERD  Hospital Course:[name] is a 73 y/o M with history of nonobstructive CAD by cath in 2004, PVCs, NICM (EF 35-40% in 2009, improved to 55% by echo 2010), CKD, DM, HL, HTN who presented to [facility] with substernal chest pressure. On day of admission he began having substernal chest pressure radiating to his shoulders. He checked his BP which was normal but pulse felt erratic. He presented to the ER where he was found to be in rapid atrial fibrillation. EKG showed mild repolarization abnormality. He was started on diltiazem and heparin gtt. He denied any SOB, orthopnea, PND, LE edema, syncope, claudication, focal weakness, or bleeding diathesis. No h/o thyroid issues, recreational drug use or alcohol use. He was admitted for further evaluation. While on diltiazem drip HR came down into the 40's thus this was discontinued. Troponins remained negative. D-dimer was 0.65 so VQ was obtained which was normal. TSH wnl. CXR nonacute. VQ was low probability for PE. Given his CKD and admitting Cr of 1.9, ACEI and HCTZ were discontinued. He ultimately spontaneously converted and was placed  on Xarelto reduced dosing due to renal insufficiency. Aspirin was discontinued due to being on Xarelto. It was felt that he should undergo nuclear stress testing over cath due to his renal insufficiency (and plan cath only if large territory of myocardium at risk from ischemia). 2D Echo 09/22/13 showed EF 35-40%, severe HK of the basal-midinferolateral and inferior myocardium, grade 2 diastolic dysfunction, mildly dilated LA. He underwent nuclear scan to assess for ischemia which showed primarily fixed, medium-sized, mild mid to apical anteroseptal perfusion defect with wall motion abnormality in the same region, suggestive of possible prior infarction with no significant ischemia. EF was mildly decreased at 48% with apical septal hypokinesis. The study was felt overall low risk. Although he has had some WMA demonstrated on the above studies, in light of negative troponins and CKD he will treated medically at this time. He was instructed to monitor for recurrent symptoms. Hydralazine was added for afterload reduction. Dr. Radford Pax has seen and examined the patient today and feels he is stable for discharge.  Other things to note this adm: - Can consider outpt labs given cessation of his ACEI. This may need to be resumed at some point for nephroprotection from DM. - He was asked to discuss resumption of his metformin with his PCP/diabetes doctor given his renal insufficiency and relative caution with this medication.  - Have left msg on voicemail to schedule a f/u appt as well as plug him into [facility] for periodic monitoring of NOAC in setting of CKD - Carbamazepime was discontinued due to interaction with Xarelto (pt was aware of this  per d/w pharmacist). He was on this due to facial nerve pain.   Discharge Vitals: Blood pressure 128/57, pulse 69, temperature 98.1 F (36.7 C), temperature source Oral, resp. rate 18, height 5\' 11"  (1.803 m), weight 194 lb 3.6 oz (88.1 kg), SpO2 97.00%.  Labs: Lab  Results  Component Value Date   WBC 5.5 09/24/2013   HGB 13.7 09/24/2013   HCT 39.9 09/24/2013   MCV 88.9 09/24/2013   PLT 130* 09/24/2013    Recent Labs Lab 09/21/13 2205  NA 141  K 4.0  CL 100  CO2 25  BUN 20  CREATININE 1.90*  CALCIUM 8.7  GLUCOSE 213*    Recent Labs  09/23/13 1050 09/23/13 1425 09/23/13 2220  TROPONINI <0.30 <0.30 <0.30    Lab Results  Component Value Date   DDIMER 0.65* 09/21/2013    Diagnostic Studies/Procedures   2D Echo 09/22/13 - Left ventricle: The cavity size was normal. Wall thickness was normal. Systolic function was moderately reduced. The estimated ejection fraction was in the range of 35% to 40%. Severe hypokinesis of the basal-midinferolateral and inferior myocardium. Features are consistent with a pseudonormal left ventricular filling pattern, with concomitant abnormal relaxation and increased filling pressure (grade 2 diastolic dysfunction). - Left atrium: The atrium was mildly dilated.  Nm Myocar Multi W/spect W/wall Motion / Ef  09/24/2013   CLINICAL DATA:  Chest pain  EXAM: Lexiscan Myovue  TECHNIQUE: The patient received IV Lexiscan .4mg  over 15 seconds. 33.0 mCi of Technetium 62m Sestamibi injected at 30 seconds. Quantitative SPECT images were obtained in the vertical, horizontal and short axis planes after a 45 minute delay. Rest images were obtained with similar planes and delay using 10.2 mCi of Technetium 70m Sestamibi.  FINDINGS: ECG:  No ischemic changes with Lexiscan injection.  Quantitiative Gated SPECT EF: 48%. Gated images showed hypokinesis of the apical septal wall.  Perfusion Images: There was a medium-sized, mild mid to apical anteroseptal perfusion defect that was seen both with Lexiscan stress and at rest.  IMPRESSION: 1. Primarily fixed, medium-sized, mild mid to apical anteroseptal perfusion defect. There was a wall motion abnormality in the same region. This is suggestive of possible prior infarction with no significant  ischemia.  2.  EF mildly decreased at 48% with apical septal hypokinesis.  3.  Overall low risk study with no significant ischemia noted.  Dalton Mclean   Electronically Signed   By: Loralie Champagne M.D.   On: 09/24/2013 15:41   Nm Pulmonary Perf And Vent  09/22/2013   CLINICAL DATA:  Chest pain, elevated D-dimer, renal insufficiency  EXAM: NUCLEAR MEDICINE VENTILATION - PERFUSION LUNG SCAN  TECHNIQUE: Ventilation images were obtained in multiple projections using inhaled aerosol technetium 99 M DTPA. Perfusion images were obtained in multiple projections after intravenous injection of Tc-79m MAA.  RADIOPHARMACEUTICALS:  40 mCi Tc-71m DTPA aerosol and 6 mCi Tc-27m MAA  COMPARISON:  None; correlation chest radiograph 09/21/2013  FINDINGS: Ventilation: Small amount of central airway deposition at the LEFT hilum. Swallowed aerosol within stomach. Ventilation defect at posterior basilar LEFT lower lobe.  Perfusion: Matching perfusion defect at posterior basilar LEFT lower lobe. No other perfusion defects identified.  Chest radiograph: No acute infiltrate. Mild elevation of RIGHT diaphragm.  IMPRESSION: Matching perfusion and ventilation defects in posterior basilar LEFT lower lobe.  Low probability pulmonary embolism.   Electronically Signed   By: Lavonia Dana M.D.   On: 09/22/2013 14:49   Dg Chest Port 1 View  09/21/2013  CLINICAL DATA:  Chest pain.  EXAM: PORTABLE CHEST - 1 VIEW  COMPARISON:  DG CHEST 2 VIEW dated 08/28/2007  FINDINGS: Cardiac silhouette is unremarkable. Mildly calcified aortic knob. Persistent mildly elevated right hemidiaphragm. No pleural effusions or focal consolidation; left costophrenic angle incompletely imaged. No pneumothorax.  Interval left humeral arthroplasty. Multiple EKG lines overlie the patient and may obscure subtle underlying pathology. Moderate degenerative change of the thoracic spine.  IMPRESSION: No acute cardiopulmonary process.   Electronically Signed   By: Elon Alas    On: 09/21/2013 22:58    Discharge Medications   Current Discharge Medication List    START taking these medications   Details  hydrALAZINE (APRESOLINE) 10 MG tablet Take 1 tablet (10 mg total) by mouth every 8 (eight) hours. Qty: 90 tablet, Refills: 3    Rivaroxaban (XARELTO) 15 MG TABS tablet Take 1 tablet (15 mg total) by mouth daily with supper. Qty: 30 tablet, Refills: 3      CONTINUE these medications which have CHANGED   Details  metoprolol (LOPRESSOR) 50 MG tablet Take 1.5 tablets (75 mg total) by mouth 2 (two) times daily. Qty: 90 tablet, Refills: 3      CONTINUE these medications which have NOT CHANGED   Details  atorvastatin (LIPITOR) 80 MG tablet Take 80 mg by mouth daily.    Coenzyme Q10 (COQ10) 200 MG CAPS Take 1 capsule by mouth daily.    glimepiride (AMARYL) 2 MG tablet Take 4 mg by mouth daily before breakfast.     omeprazole (PRILOSEC) 10 MG capsule Take 20 mg by mouth daily.    tamsulosin (FLOMAX) 0.4 MG CAPS capsule Take 0.4 mg by mouth at bedtime.      STOP taking these medications     aspirin 81 MG tablet      carbamazepine (TEGRETOL) 100 MG chewable tablet      enalapril (VASOTEC) 5 MG tablet      hydrochlorothiazide 25 MG tablet      metFORMIN (GLUCOPHAGE) 850 MG tablet         Disposition   The patient will be discharged in stable condition to home. Discharge Orders   Future Orders Complete By Expires   Diet - low sodium heart healthy  As directed    Scheduling Instructions:   Diabetic Diet   Discharge instructions  As directed    Increase activity slowly  As directed    Scheduling Instructions:   If symptoms happen again, please seek medical attention.  Some patients with kidney disease should not be on Metformin. We have stopped this medicine. Please contact your primary care doctor to discuss whether you should restart or change to a different medicine.   We have stopped your enalapril and hydrochlorothiazide - but don't  throw these away in case your doctor wants to restart them in at your follow-up visit.     Follow-up Information   Follow up with [facility]. (Office will call you for your followup appointments. Please call the office if you have not heard back in 3 days.)    Specialty:  Cardiology   Contact information:   [address], [place] [address] [place] [address] [phone]      Follow up with [name], MD. (See below regarding Metformin)    Specialty:  Internal Medicine   Contact information:   [place] [place] [address] [phone]         Duration of Discharge  Encounter: Greater than 30 minutes including physician and PA time.  Signed, Charlie Pitter PA-C 09/24/2013, 5:23 PM

## 2013-09-24 NOTE — Progress Notes (Signed)
Pt/family given discharge instructions, medication lists, follow up appointments, and when to call the doctor.  Pt/family verbalizes understanding. Payton Emerald, RN

## 2013-09-24 NOTE — Progress Notes (Signed)
Lexiscan myoview completed without complications.  Freq PACs.  nuc results to follow.

## 2013-09-24 NOTE — Progress Notes (Addendum)
Patient seen and examined and agree with note as outlined by Cecilie Kicks NP.  Admitted with CP and afib with RVR.  Now in NSR.  Ruled out for MI by serial enzymes.  Plan for [facility] today and if no ischemia then discharge home. Continue Xarelto/mteroprolol/statin.  Resume Metformin and amaryl at D/C.  ACE I and HCTZ stopped due to renal insuff.  Will add Hydralazine 10mg  TID for afterload reduction and continue beta blocker.

## 2013-09-27 ENCOUNTER — Telehealth: Payer: Self-pay | Admitting: *Deleted

## 2013-09-27 NOTE — Discharge Summary (Signed)
Agree with discharge summary as outlined by Dayna Dunn, PA-C 

## 2013-09-27 NOTE — Telephone Encounter (Signed)
PA to [facility] for patients xarelto 

## 2013-09-29 ENCOUNTER — Encounter: Payer: Self-pay | Admitting: Physician Assistant

## 2013-09-29 DIAGNOSIS — Z7901 Long term (current) use of anticoagulants: Secondary | ICD-10-CM | POA: Insufficient documentation

## 2013-10-05 ENCOUNTER — Encounter: Payer: Self-pay | Admitting: Physician Assistant

## 2013-10-05 ENCOUNTER — Ambulatory Visit (INDEPENDENT_AMBULATORY_CARE_PROVIDER_SITE_OTHER): Payer: Medicare Other | Admitting: Physician Assistant

## 2013-10-05 ENCOUNTER — Telehealth: Payer: Self-pay | Admitting: Physician Assistant

## 2013-10-05 VITALS — BP 128/60 | HR 74 | Ht 71.0 in | Wt 192.0 lb

## 2013-10-05 DIAGNOSIS — I2510 Atherosclerotic heart disease of native coronary artery without angina pectoris: Secondary | ICD-10-CM

## 2013-10-05 DIAGNOSIS — I4949 Other premature depolarization: Secondary | ICD-10-CM

## 2013-10-05 DIAGNOSIS — E1129 Type 2 diabetes mellitus with other diabetic kidney complication: Secondary | ICD-10-CM

## 2013-10-05 DIAGNOSIS — I4891 Unspecified atrial fibrillation: Secondary | ICD-10-CM

## 2013-10-05 DIAGNOSIS — N183 Chronic kidney disease, stage 3 unspecified: Secondary | ICD-10-CM

## 2013-10-05 DIAGNOSIS — I10 Essential (primary) hypertension: Secondary | ICD-10-CM

## 2013-10-05 DIAGNOSIS — I493 Ventricular premature depolarization: Secondary | ICD-10-CM

## 2013-10-05 DIAGNOSIS — E785 Hyperlipidemia, unspecified: Secondary | ICD-10-CM

## 2013-10-05 DIAGNOSIS — I428 Other cardiomyopathies: Secondary | ICD-10-CM

## 2013-10-05 NOTE — Progress Notes (Signed)
[address] [place], [place]  [address] Phone: [phone] Fax:  [phone]  Date:  10/05/2013   ID:  Erik [name], Erik [name] Aug 10, 1940, MRN [id number]  PCP:  [name], MD  Cardiologist:  Dr. Jenell Milliner => Dr. Ena Dawley      History of Present Illness: Erik [name] is a 73 y.o. male with a hx of no significant CAD on cath in 2004, PVCs, NICM (EF 35% in past and improved to 55% in 2010), CKD, DM, HTN, HL.  He was admitted 4/28-5/1 with chest discomfort in the setting of AFib with RVR. He converted to NSR on IV dilt. Cardiac enzymes remained normal.  D-dimer was elevated and VQ scan was low probability for pulmonary embolism. He was placed on Xarelto for anticoagulation (CHADS2-VASc=3).  Echocardiogram demonstrated worsening LV function with an EF of 35-40% and inferior and inferolateral hypokinesis. Patient was set up for a nuclear study as an inpatient. This demonstrated mid to apical anteroseptal perfusion defect suggestive of possible infarct but no ischemia, EF 48%. This was felt to be a low risk study. Decision was made to treat him medically.  Of note, on enalapril was stopped in the setting of CKD.  Carbamazepine was stopped due to interaction with Xarelto.  He returns for follow up. He is doing well.  The patient denies chest pain, shortness of breath, syncope, orthopnea, PND or significant pedal edema.  He did have follow up lab work last week with his primary care physician. He was told to remain off of metformin.   Studies:  - LHC (12/2002):  No significant CAD, EF 30-35% (done after CLite demonstrated poss inf ischemia).  - Echo (09/22/13):  EF 35-40%, inferolateral and inf HK, Gr 2 DD, mild LAE.   - Nuclear (09/23/13):  Poss anteroseptal infarct, no ischemia, EF 48%; Low Risk    Recent Labs: 12/02/2012: ALT 22  09/21/2013: Creatinine 1.90*; Potassium 4.0  09/22/2013: TSH 4.110  09/24/2013: Hemoglobin 13.7 09/29/2013:  K 4.7, creatinine 1.9, Hgb  16.1  Wt Readings from Last 3 Encounters:  10/05/13 192 lb (87.091 kg)  09/22/13 194 lb 3.6 oz (88.1 kg)  12/02/12 198 lb 3.1 oz (89.9 kg)     Past Medical History  Diagnosis Date  . Hyperlipidemia   . Hypertension   . Diabetes mellitus   . GERD (gastroesophageal reflux disease)   . NICM (nonischemic cardiomyopathy)     a. EF 35-40% in 2009, 55% in 2010. b. 09/2013: 35-40% by echo and 48% by nuc.  . Coronary artery disease     a. Nonobstructive by cath 2004. b. Nuc 09/2013 - no ischemia, fixed defect suggestive of possible prior infarction, felt low risk, EF 48%.   . Premature ventricular contractions   . Atrial fibrillation     a. 09/2013: RVR then bradycardia then spont conv to NSR.  . CKD (chronic kidney disease), stage III   . 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 atorvastatin (LIPITOR) 80 MG tablet Take 80 mg by mouth daily.      . Coenzyme Q10 (COQ10) 200 MG CAPS Take 1 capsule by mouth daily.      [name] glimepiride (AMARYL) 2 MG tablet Take 4 mg by mouth daily before breakfast.       . hydrALAZINE (APRESOLINE) 10 MG tablet Take 1 tablet (10 mg total) by mouth every 8 (eight) hours.  90 tablet  3  . metoprolol (LOPRESSOR) 50 MG tablet Take 1.5 tablets (75 mg total) by mouth 2 (two) times daily.  90 tablet  3  . omeprazole (PRILOSEC) 10 MG capsule Take 20 mg by mouth daily.      . Rivaroxaban (XARELTO) 15 MG TABS tablet Take 1 tablet (15 mg total) by mouth daily with supper.  30 tablet  3  . tamsulosin (FLOMAX) 0.4 MG CAPS capsule Take 0.4 mg by mouth at bedtime.       No current facility-administered medications for this visit.    Allergies:   Review of patient's allergies indicates no known allergies.   Social History:  The patient  reports that he has quit smoking. His smoking use included Cigarettes. He smoked 0.00 packs per day. He does not have any smokeless tobacco history on file.   Family  History:  The patient's family history is not on file.   ROS:  Please see the history of present illness.   He denies bleeding problems.   All other systems reviewed and negative.   PHYSICAL EXAM: VS:  BP 128/60  Pulse 74  Ht 5\' 11"  (1.803 m)  Wt 192 lb (87.091 kg)  BMI 26.79 kg/m2 Well nourished, well developed, in no acute distress HEENT: normal Neck: no JVD Cardiac:  normal S1, S2; RRR; no murmur Lungs:  clear to auscultation bilaterally, no wheezing, rhonchi or rales Abd: soft, nontender, no hepatomegaly Ext: no edema Skin: warm and dry Neuro:  CNs 2-12 intact, no focal abnormalities noted  EKG:  NSR, HR 74, normal axis, PVCs, nonspecific ST-T wave changes     ASSESSMENT AND PLAN:  1. Atrial fibrillation:  He remains in NSR.  He is tolerating Xarelto.  I will arrange f/u in CVRR clinic for management of NOAC Rx.  Continue current dose of beta blocker. 2. Coronary artery disease:  Presumed given WMA on echo and scar on Nuclear study.  He is asymptomatic.  Continue med Rx.  He is not on ASA as he is on Xarelto.  Continue beta blocker, statin.  3. NICM (nonischemic cardiomyopathy):  His EF is down again.  Continue beta blocker and Hydralazine.  He was previously on ACEI with baseline CKD.  I requested labs from his PCPs office.  His creatinine remains stable.  Patient tells me he was on enalapril for years.  His Creatinine has ranged 1.68-2.20 dating back to 2009.  I believe it is safe to resume his ACEI with close follow up of his renal function.  I assume this was held in the setting of AFib with RVR and need for IV rate controlling medications as well as the anticipation of possible cardiac cath (nephrotoxic meds held due to CKD).  I will have him resume Enalapril 5 mg QD.  Repeat BMET in 5 days. 4. Hypertension:  Controlled.  5. HYPERLIPIDEMIA:  Continue statin.  6. Premature ventricular contractions:  Asymptomatic.  Continue beta blocker.  7. CKD (chronic kidney disease), stage  III:  As above.   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 9. Disposition:  Keep f/u next month with Dr. Ena Dawley.  Signed, Richardson Dopp, PA-C  10/05/2013 11:49 AM

## 2013-10-05 NOTE — Patient Instructions (Signed)
YOU WILL NEED TO FOLLOW UP WITH OUR ANTICOAGULATION CLINIC THE END OF MAY 2015 SINCE YOU JUST STARTED ON XARELTO 09/21/13.  NO CHANGES WERE MADE TODAY WE WILL GET THE LAB WORK THAT WAS DONE WITH DR. Osborne Casco

## 2013-10-05 NOTE — Telephone Encounter (Signed)
Tell Barnett Hatter I reviewed his labs. I want him to resume Enalapril 5 mg QD. Check BMET 5 days after resuming Enalapril and again 12 days after resuming Enalapril. Richardson Dopp, PA-C   10/05/2013 8:57 PM

## 2013-10-06 ENCOUNTER — Telehealth: Payer: Self-pay | Admitting: *Deleted

## 2013-10-06 DIAGNOSIS — I10 Essential (primary) hypertension: Secondary | ICD-10-CM

## 2013-10-06 MED ORDER — ENALAPRIL MALEATE 5 MG PO TABS: 5.0000 mg | ORAL_TABLET | Freq: Every day | ORAL | Status: DC

## 2013-10-06 NOTE — Telephone Encounter (Signed)
pt notified about needing to resume enalapril 5 mg QD, bmet 5/19, then again 5/26 per Brynda Rim. PA; pt verbalized Plan of Care

## 2013-10-07 NOTE — Telephone Encounter (Signed)
[facility] approved xarelto through 09/28/2014

## 2013-10-12 ENCOUNTER — Other Ambulatory Visit (INDEPENDENT_AMBULATORY_CARE_PROVIDER_SITE_OTHER): Payer: Medicare Other

## 2013-10-12 ENCOUNTER — Telehealth: Payer: Self-pay | Admitting: *Deleted

## 2013-10-12 DIAGNOSIS — I10 Essential (primary) hypertension: Secondary | ICD-10-CM

## 2013-10-12 LAB — BASIC METABOLIC PANEL
BUN: 19 mg/dL (ref 6–23)
CO2: 26 meq/L (ref 19–32)
Calcium: 8.7 mg/dL (ref 8.4–10.5)
Chloride: 108 mEq/L (ref 96–112)
Creatinine, Ser: 1.8 mg/dL — ABNORMAL HIGH (ref 0.4–1.5)
GFR: 39.05 mL/min — ABNORMAL LOW (ref >60.00)
GLUCOSE: 181 mg/dL — AB (ref 70–99)
Potassium: 4.1 mEq/L (ref 3.5–5.1)
Sodium: 139 mEq/L (ref 135–145)

## 2013-10-12 MED ORDER — ENALAPRIL MALEATE 5 MG PO TABS: 5.0000 mg | ORAL_TABLET | Freq: Every day | ORAL | Status: DC

## 2013-10-12 NOTE — Telephone Encounter (Signed)
Pt notified about lab results today, he asked if he is supposed to take 1 or 2 tabs of the 5 mg enalapril, I said take 1 tab 5 mg tab daily of the enalapril, pt said ok he was doing that, he had an old bottle. I will send in rx with updated rx.

## 2013-10-14 ENCOUNTER — Encounter: Payer: Self-pay | Admitting: *Deleted

## 2013-10-19 ENCOUNTER — Other Ambulatory Visit (INDEPENDENT_AMBULATORY_CARE_PROVIDER_SITE_OTHER): Payer: Medicare Other

## 2013-10-19 ENCOUNTER — Telehealth: Payer: Self-pay | Admitting: *Deleted

## 2013-10-19 DIAGNOSIS — I10 Essential (primary) hypertension: Secondary | ICD-10-CM

## 2013-10-19 LAB — BASIC METABOLIC PANEL
BUN: 17 mg/dL (ref 6–23)
CALCIUM: 8.7 mg/dL (ref 8.4–10.5)
CO2: 27 mEq/L (ref 19–32)
Chloride: 109 mEq/L (ref 96–112)
Creatinine, Ser: 1.7 mg/dL — ABNORMAL HIGH (ref 0.4–1.5)
GFR: 40.86 mL/min — ABNORMAL LOW (ref >60.00)
GLUCOSE: 203 mg/dL — AB (ref 70–99)
Potassium: 4.3 mEq/L (ref 3.5–5.1)
Sodium: 142 mEq/L (ref 135–145)

## 2013-10-19 NOTE — Telephone Encounter (Signed)
pt notified about lab results with verbal understanding  

## 2013-10-22 ENCOUNTER — Ambulatory Visit (INDEPENDENT_AMBULATORY_CARE_PROVIDER_SITE_OTHER): Payer: Medicare Other | Admitting: Pharmacist

## 2013-10-22 DIAGNOSIS — I4891 Unspecified atrial fibrillation: Secondary | ICD-10-CM

## 2013-10-22 DIAGNOSIS — Z79899 Other long term (current) drug therapy: Secondary | ICD-10-CM

## 2013-10-22 NOTE — Progress Notes (Signed)
Pt was started on Xarelto 15 mg qd for AFib on 09/24/13.    Reviewed patients medication list.  Pt is not currently on any combined P-gp and strong CYP3A4 inhibitors/inducers (ketoconazole, traconazole, ritonavir, carbamazepine, phenytoin, rifampin, St. John's wort).  Reviewed labs.  SCr 1.7 on 10/19/13, Weight 192 lbs (87 kg), CrCl- 46 ml/min.  Dose appropriate based on CrCl.   Hgb and HCT Within Normal Limits on 09/29/13.  Patient has h/o CKD, but renal function improved recently.  Will recheck BMET and CBC again in 4 weeks, and may need to consider increasing Xarelto to 20 mg qd if renal function continues to improve.  Patient tolerating Xarelto 15 mg well without complaint.  He takes it in the evening with food.  A full discussion of the nature of anticoagulants has been carried out.  A benefit/risk analysis has been presented to the patient, so that they understand the justification for choosing anticoagulation with Xarelto at this time.  The need for compliance is stressed.  Pt is aware to take the medication once daily with the largest meal of the day.  Side effects of potential bleeding are discussed, including unusual colored urine or stools, coughing up blood or coffee ground emesis, nose bleeds or serious fall or head trauma.  Discussed signs and symptoms of stroke. The patient should avoid any OTC items containing aspirin or ibuprofen.  Avoid alcohol consumption.   Call if any signs of abnormal bleeding.  Discussed financial obligations and resolved any difficulty in obtaining medication.  Next lab test test in 1 months.

## 2013-10-22 NOTE — Patient Instructions (Signed)
1.  Okay to take tylenol for pain. Avoid advil, aleve, ibuprofen, and aspirin. 2.  Take Xarelto every night with food. 3.  Call if any bleeding seen.  Go to ER if fall and hit head. 4.  Get blood work on 11/22/13 to recheck kidney function and hemoglobin.

## 2013-10-25 ENCOUNTER — Ambulatory Visit (INDEPENDENT_AMBULATORY_CARE_PROVIDER_SITE_OTHER): Payer: Medicare Other | Admitting: Cardiology

## 2013-10-25 ENCOUNTER — Encounter: Payer: Self-pay | Admitting: Cardiology

## 2013-10-25 VITALS — BP 112/52 | HR 72 | Ht 71.0 in | Wt 193.0 lb

## 2013-10-25 DIAGNOSIS — Z79899 Other long term (current) drug therapy: Secondary | ICD-10-CM

## 2013-10-25 LAB — BASIC METABOLIC PANEL
BUN: 15 mg/dL (ref 6–23)
CO2: 28 mEq/L (ref 19–32)
Calcium: 8.3 mg/dL — ABNORMAL LOW (ref 8.4–10.5)
Chloride: 107 mEq/L (ref 96–112)
Creatinine, Ser: 1.9 mg/dL — ABNORMAL HIGH (ref 0.4–1.5)
GFR: 37.83 mL/min — ABNORMAL LOW (ref >60.00)
Glucose, Bld: 226 mg/dL — ABNORMAL HIGH (ref 70–99)
Potassium: 4.3 mEq/L (ref 3.5–5.1)
Sodium: 140 mEq/L (ref 135–145)

## 2013-10-25 NOTE — Patient Instructions (Signed)
Your physician recommends that you continue on your current medications as directed. Please refer to the Current Medication list given to you today.  Your physician recommends that you schedule a follow-up appointment in: 3 months  Will obtain labs today and call you with the results (bmet)

## 2013-10-25 NOTE — Progress Notes (Signed)
Patient ID: Erik [name], male   DOB: 1940-12-26, 73 y.o.   MRN: [id number]    Date:  10/25/2013   ID:  [name], [name] 21-Jun-1940, MRN [id number]  PCP:  [name], MD  Cardiologist:  Dr. Jenell Milliner => Dr. Ena Dawley     History of Present Illness: Erik [name] is a 73 y.o. male with a hx of no significant CAD on cath in 2004, PVCs, NICM (EF 35% in past and improved to 55% in 2010), CKD, DM, HTN, HL.  He was admitted 4/28-5/1 with chest discomfort in the setting of AFib with RVR. He converted to NSR on IV dilt. Cardiac enzymes remained normal.  D-dimer was elevated and VQ scan was low probability for pulmonary embolism. He was placed on Xarelto for anticoagulation (CHADS2-VASc=3).  Echocardiogram demonstrated worsening LV function with an EF of 35-40% and inferior and inferolateral hypokinesis. Patient was set up for a nuclear study as an inpatient. This demonstrated mid to apical anteroseptal perfusion defect suggestive of possible infarct but no ischemia, EF 48%. This was felt to be a low risk study. Decision was made to treat him medically.  Of note, on enalapril was stopped in the setting of CKD.  Carbamazepine was stopped due to interaction with Xarelto.  He returns for follow up. He is doing well.  The patient denies chest pain, shortness of breath, syncope, orthopnea, PND or pedal edema.  He was told to remain off of metformin and is taking glimepiride.  Studies:  - LHC (12/2002):  No significant CAD, EF 30-35% (done after CLite demonstrated poss inf ischemia).  - Echo (09/22/13):  EF 35-40%, inferolateral and inf HK, Gr 2 DD, mild LAE.   - Nuclear (09/23/13):  Poss anteroseptal infarct, no ischemia, EF 48%; Low Risk    Recent Labs: 12/02/2012: ALT 22  09/22/2013: TSH 4.110  09/24/2013: Hemoglobin 13.7  10/19/2013: Creatinine 1.7*; Potassium 4.3 09/29/2013:  K 4.7, creatinine 1.9, Hgb 16.1  Wt Readings from Last 3 Encounters:  10/25/13 193 lb (87.544 kg)  10/05/13 192  lb (87.091 kg)  09/22/13 194 lb 3.6 oz (88.1 kg)     Past Medical History  Diagnosis Date  . Hyperlipidemia   . Hypertension   . Diabetes mellitus   . GERD (gastroesophageal reflux disease)   . NICM (nonischemic cardiomyopathy)     a. EF 35-40% in 2009, 55% in 2010. b. 09/2013: 35-40% by echo and 48% by nuc.  . Coronary artery disease     a. Nonobstructive by cath 2004. b. Nuc 09/2013 - no ischemia, fixed defect suggestive of possible prior infarction, felt low risk, EF 48%.   . Premature ventricular contractions   . Atrial fibrillation     a. 09/2013: RVR then bradycardia then spont conv to NSR.  . CKD (chronic kidney disease), stage III   . 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 atorvastatin (LIPITOR) 80 MG tablet Take 80 mg by mouth daily.      . Coenzyme Q10 (COQ10) 200 MG CAPS Take 1 capsule by mouth daily.      . enalapril (VASOTEC) 5 MG tablet Take 1 tablet (5 mg total) by mouth daily.  30 tablet  11  . glimepiride (AMARYL) 2 MG tablet Take 4 mg by mouth daily before breakfast. TAKE 2 TABS IN THE BREAKFAST AND 2 TABS AT SUPPER      . hydrALAZINE (APRESOLINE) 10  MG tablet Take 1 tablet (10 mg total) by mouth every 8 (eight) hours.  90 tablet  3  . metoprolol (LOPRESSOR) 50 MG tablet Take 1.5 tablets (75 mg total) by mouth 2 (two) times daily.  90 tablet  3  . omeprazole (PRILOSEC) 10 MG capsule Take 10 mg by mouth daily.       . Rivaroxaban (XARELTO) 15 MG TABS tablet Take 1 tablet (15 mg total) by mouth daily with supper.  30 tablet  3  . tamsulosin (FLOMAX) 0.4 MG CAPS capsule Take 0.4 mg by mouth at bedtime.       No current facility-administered medications for this visit.    Allergies:   Review of patient's allergies indicates no known allergies.   Social History:  The patient  reports that he has quit smoking. His smoking use included Cigarettes. He smoked 0.00 packs per day. He does not have any  smokeless tobacco history on file.   Family History:  The patient's family history includes Diabetes in his brother and mother; Heart disease in his brother, father, and mother.   ROS:  Please see the history of present illness.   He denies bleeding problems.   All other systems reviewed and negative.   PHYSICAL EXAM: VS:  BP 112/52  Pulse 72  Ht 5\' 11"  (1.803 m)  Wt 193 lb (87.544 kg)  BMI 26.93 kg/m2 Well nourished, well developed, in no acute distress HEENT: normal Neck: no JVD Cardiac:  normal S1, S2; RRR; no murmur Lungs:  clear to auscultation bilaterally, no wheezing, rhonchi or rales Abd: soft, nontender, no hepatomegaly Ext: no edema Skin: warm and dry Neuro:  CNs 2-12 intact, no focal abnormalities noted  EKG:  NSR, HR 74, normal axis, PVCs, nonspecific ST-T wave changes       ASSESSMENT AND PLAN:  1. Atrial fibrillation:  He remains in NSR.  He is tolerating Xarelto.    Continue current dose of beta blocker.  2. Coronary artery disease:  Presumed given WMA on echo and scar on Nuclear study.  He is asymptomatic.  Continue med Rx.  He is not on ASA as he is on Xarelto.  Continue beta blocker, statin.   3. NICM (nonischemic cardiomyopathy):  His EF is down again.  Continue beta blocker and Hydralazine.  Satrted on ACEI a month ago. We will repeat BMP today as he is CKD stage 3 at baseline.   4. Hypertension:  Controlled.   5. HYPERLIPIDEMIA:  Continue statin.   6. CKD (chronic kidney disease), stage III:  As above.    7. Type II diabetes mellitus with renal manifestations:  Resume ACEI as noted above.  BMP today and follow up in 3 months.  [name]  10/25/2013 12:34 PM

## 2013-11-22 ENCOUNTER — Other Ambulatory Visit (INDEPENDENT_AMBULATORY_CARE_PROVIDER_SITE_OTHER): Payer: Medicare Other

## 2013-11-22 DIAGNOSIS — I4891 Unspecified atrial fibrillation: Secondary | ICD-10-CM

## 2013-11-22 DIAGNOSIS — Z79899 Other long term (current) drug therapy: Secondary | ICD-10-CM

## 2013-11-22 LAB — BASIC METABOLIC PANEL
BUN: 17 mg/dL (ref 6–23)
CO2: 27 mEq/L (ref 19–32)
Calcium: 8.7 mg/dL (ref 8.4–10.5)
Chloride: 105 mEq/L (ref 96–112)
Creatinine, Ser: 2 mg/dL — ABNORMAL HIGH (ref 0.4–1.5)
GFR: 35.4 mL/min — ABNORMAL LOW (ref >60.00)
Glucose, Bld: 314 mg/dL — ABNORMAL HIGH (ref 70–99)
Potassium: 4.3 mEq/L (ref 3.5–5.1)
Sodium: 139 mEq/L (ref 135–145)

## 2013-11-22 LAB — CBC
HCT: 42.5 % (ref 39.0–52.0)
Hemoglobin: 14.3 g/dL (ref 13.0–17.0)
MCHC: 33.7 g/dL (ref 30.0–36.0)
MCV: 88.8 fl (ref 78.0–100.0)
Platelets: 140 10*3/uL — ABNORMAL LOW (ref 150.0–400.0)
RBC: 4.78 Mil/uL (ref 4.22–5.81)
RDW: 13.8 % (ref 11.5–15.5)
WBC: 5 10*3/uL (ref 4.0–10.5)

## 2013-11-23 ENCOUNTER — Telehealth: Payer: Self-pay | Admitting: *Deleted

## 2013-11-23 DIAGNOSIS — I5022 Chronic systolic (congestive) heart failure: Secondary | ICD-10-CM

## 2013-11-23 NOTE — Telephone Encounter (Signed)
Contacted pt about recent labs done and Dr Francesca Oman recommendation for pt to have a f/u OV scheduled with her in September, and for him to have a echo and BMP done prior to that visit.  Informed pt that someone from our scheduling department will be contacting him to get these appts set up.  Pt verbalized understanding and agrees with this plan.  Will send [facility] pool a message to have this scheduled.

## 2013-11-23 NOTE — Telephone Encounter (Signed)
Message copied by Nuala Alpha on Tue Nov 23, 2013 10:13 AM ------      Message from: Erik [name]      Created: Mon Nov 22, 2013 11:54 PM       Could you please schedule this patient for an outpatient visit in September with an echocardiogram and BMP prior to the visit? Diagnosis is chronic systolic CHF.      Thank you,      K ------

## 2013-12-10 ENCOUNTER — Ambulatory Visit (HOSPITAL_COMMUNITY): Payer: Medicare Other | Attending: Cardiology | Admitting: Radiology

## 2013-12-10 ENCOUNTER — Other Ambulatory Visit (INDEPENDENT_AMBULATORY_CARE_PROVIDER_SITE_OTHER): Payer: Medicare Other

## 2013-12-10 DIAGNOSIS — I517 Cardiomegaly: Secondary | ICD-10-CM | POA: Insufficient documentation

## 2013-12-10 DIAGNOSIS — I079 Rheumatic tricuspid valve disease, unspecified: Secondary | ICD-10-CM | POA: Insufficient documentation

## 2013-12-10 DIAGNOSIS — E785 Hyperlipidemia, unspecified: Secondary | ICD-10-CM | POA: Insufficient documentation

## 2013-12-10 DIAGNOSIS — E119 Type 2 diabetes mellitus without complications: Secondary | ICD-10-CM | POA: Insufficient documentation

## 2013-12-10 DIAGNOSIS — I509 Heart failure, unspecified: Secondary | ICD-10-CM | POA: Insufficient documentation

## 2013-12-10 DIAGNOSIS — I4891 Unspecified atrial fibrillation: Secondary | ICD-10-CM | POA: Insufficient documentation

## 2013-12-10 DIAGNOSIS — I5022 Chronic systolic (congestive) heart failure: Secondary | ICD-10-CM

## 2013-12-10 DIAGNOSIS — I10 Essential (primary) hypertension: Secondary | ICD-10-CM | POA: Insufficient documentation

## 2013-12-10 DIAGNOSIS — I059 Rheumatic mitral valve disease, unspecified: Secondary | ICD-10-CM | POA: Insufficient documentation

## 2013-12-10 DIAGNOSIS — I428 Other cardiomyopathies: Secondary | ICD-10-CM

## 2013-12-10 DIAGNOSIS — I379 Nonrheumatic pulmonary valve disorder, unspecified: Secondary | ICD-10-CM | POA: Insufficient documentation

## 2013-12-10 LAB — BASIC METABOLIC PANEL
BUN: 19 mg/dL (ref 6–23)
CO2: 30 mEq/L (ref 19–32)
Calcium: 8.7 mg/dL (ref 8.4–10.5)
Chloride: 106 mEq/L (ref 96–112)
Creatinine, Ser: 1.9 mg/dL — ABNORMAL HIGH (ref 0.4–1.5)
GFR: 37.59 mL/min — ABNORMAL LOW (ref >60.00)
Glucose, Bld: 221 mg/dL — ABNORMAL HIGH (ref 70–99)
Potassium: 3.9 mEq/L (ref 3.5–5.1)
Sodium: 141 mEq/L (ref 135–145)

## 2013-12-10 NOTE — Progress Notes (Signed)
Echocardiogram performed.  

## 2013-12-10 NOTE — Progress Notes (Signed)
Quick Note:  Preliminary report reviewed by triage nurse and sent to MD desk. ______ 

## 2013-12-20 ENCOUNTER — Telehealth: Payer: Self-pay | Admitting: Internal Medicine

## 2013-12-20 NOTE — Telephone Encounter (Signed)
°  Patient would like ECHO and lab work results. Please call and advise.

## 2013-12-21 NOTE — Telephone Encounter (Signed)
Will forward to Dr. [name] to review echo.

## 2013-12-22 ENCOUNTER — Telehealth: Payer: Self-pay

## 2013-12-22 NOTE — Telephone Encounter (Signed)
Message copied by Lamar Laundry on Wed Dec 22, 2013 10:38 AM ------      Message from: [name]      Created: Wed Dec 22, 2013  7:10 AM       Please let him know that his LV function has improved (from 35-40% to 50%). His labs are stable but very elevated blood sugar. He should follow with his PCP for that.      Thank you,       KN ------

## 2013-12-22 NOTE — Telephone Encounter (Signed)
Notes Recorded by [name], MD on 12/22/2013 at 7:10 AM Please let him know that his LV function has improved (from 35-40% to 50%). His labs are stable but very elevated blood sugar. He should follow with his PCP for that. Thank you,  KN Advised patient. Will send copy of labs and echo to Dr Osborne Casco, patient has follow up him first of August

## 2013-12-22 NOTE — Telephone Encounter (Signed)
Follow up     Pt want echo and lab results.  Please call today and tell him something.

## 2014-01-11 DIAGNOSIS — M545 Low back pain, unspecified: Secondary | ICD-10-CM | POA: Insufficient documentation

## 2014-01-20 ENCOUNTER — Other Ambulatory Visit: Payer: Self-pay

## 2014-01-20 ENCOUNTER — Other Ambulatory Visit (HOSPITAL_COMMUNITY): Payer: Self-pay | Admitting: Physician Assistant

## 2014-01-20 MED ORDER — METOPROLOL TARTRATE 50 MG PO TABS: 75.0000 mg | ORAL_TABLET | Freq: Two times a day (BID) | ORAL | Status: DC

## 2014-02-17 ENCOUNTER — Other Ambulatory Visit (HOSPITAL_COMMUNITY): Payer: Self-pay | Admitting: Cardiology

## 2014-03-21 ENCOUNTER — Other Ambulatory Visit (HOSPITAL_COMMUNITY): Payer: Self-pay | Admitting: Cardiology

## 2014-04-13 ENCOUNTER — Other Ambulatory Visit: Payer: Self-pay | Admitting: *Deleted

## 2014-04-13 MED ORDER — METOPROLOL TARTRATE 50 MG PO TABS: 75.0000 mg | ORAL_TABLET | Freq: Two times a day (BID) | ORAL | Status: DC

## 2014-04-15 ENCOUNTER — Encounter: Payer: Self-pay | Admitting: Cardiology

## 2014-04-15 ENCOUNTER — Ambulatory Visit (INDEPENDENT_AMBULATORY_CARE_PROVIDER_SITE_OTHER): Payer: Medicare Other | Admitting: Cardiology

## 2014-04-15 ENCOUNTER — Other Ambulatory Visit: Payer: Self-pay

## 2014-04-15 VITALS — BP 126/58 | HR 64 | Ht 71.0 in | Wt 195.0 lb

## 2014-04-15 DIAGNOSIS — Z7901 Long term (current) use of anticoagulants: Secondary | ICD-10-CM

## 2014-04-15 DIAGNOSIS — I5022 Chronic systolic (congestive) heart failure: Secondary | ICD-10-CM

## 2014-04-15 DIAGNOSIS — I255 Ischemic cardiomyopathy: Secondary | ICD-10-CM

## 2014-04-15 DIAGNOSIS — E785 Hyperlipidemia, unspecified: Secondary | ICD-10-CM

## 2014-04-15 DIAGNOSIS — I4891 Unspecified atrial fibrillation: Secondary | ICD-10-CM

## 2014-04-15 DIAGNOSIS — I10 Essential (primary) hypertension: Secondary | ICD-10-CM

## 2014-04-15 DIAGNOSIS — R072 Precordial pain: Secondary | ICD-10-CM

## 2014-04-15 LAB — COMPREHENSIVE METABOLIC PANEL
ALT: 13 U/L (ref 0–53)
AST: 16 U/L (ref 0–37)
Albumin: 4 g/dL (ref 3.5–5.2)
Alkaline Phosphatase: 66 U/L (ref 39–117)
BUN: 22 mg/dL (ref 6–23)
CO2: 27 mEq/L (ref 19–32)
Calcium: 9.1 mg/dL (ref 8.4–10.5)
Chloride: 108 mEq/L (ref 96–112)
Creatinine, Ser: 1.7 mg/dL — ABNORMAL HIGH (ref 0.4–1.5)
GFR: 43.09 mL/min — ABNORMAL LOW (ref >60.00)
Glucose, Bld: 195 mg/dL — ABNORMAL HIGH (ref 70–99)
Potassium: 4.5 mEq/L (ref 3.5–5.1)
Sodium: 140 mEq/L (ref 135–145)
Total Bilirubin: 0.8 mg/dL (ref 0.2–1.2)
Total Protein: 6.3 g/dL (ref 6.0–8.3)

## 2014-04-15 MED ORDER — METOPROLOL TARTRATE 50 MG PO TABS: 75.0000 mg | ORAL_TABLET | Freq: Two times a day (BID) | ORAL | Status: DC

## 2014-04-15 NOTE — Patient Instructions (Signed)
Your physician recommends that you continue on your current medications as directed. Please refer to the Current Medication list given to you today.   Your physician recommends that you return for lab work in: [place] ([facility])   Your physician wants you to follow-up in: [place] will receive a reminder letter in the mail two months in advance. If you don't receive a letter, please call our office to schedule the follow-up appointment.

## 2014-04-15 NOTE — Progress Notes (Signed)
Patient ID: Erik [name], male   DOB: 12/23/1940, 74 y.o.   MRN: [id number]    Date:  04/15/2014   ID:  [name], Erik 1941-02-26, MRN [id number]  PCP:  [name], MD  Cardiologist:  Dr. Jenell Milliner => Dr. Ena Dawley     Chief complain: Follow-up for decrease in LVEF.  History of Present Illness: Erik [name] is a 73 y.o. male with a hx of no significant CAD on cath in 2004, PVCs, NICM (EF 35% in past and improved to 55% in 2010), CKD, DM, HTN, HL.  He was admitted 4/28-5/1 with chest discomfort in the setting of AFib with RVR. He converted to NSR on IV dilt. Cardiac enzymes remained normal.  D-dimer was elevated and VQ scan was low probability for pulmonary embolism. He was placed on Xarelto for anticoagulation (CHADS2-VASc=3).  Echocardiogram demonstrated worsening LV function with an EF of 35-40% and inferior and inferolateral hypokinesis. Patient was set up for a nuclear study as an inpatient. This demonstrated mid to apical anteroseptal perfusion defect suggestive of possible infarct but no ischemia, EF 48%. This was felt to be a low risk study. Decision was made to treat him medically.  Of note, on enalapril was stopped in the setting of CKD.  Carbamazepine was stopped due to interaction with Xarelto.  He returns for follow up. He is doing well.  The patient denies chest pain, shortness of breath, syncope, orthopnea, PND or pedal edema.  He was told to remain off of metformin and is taking glimepiride.  04/15/2014 - this is 5 months follow-up. At the last visit in June we found out that patient's ejection fraction dropped to 35-40%. He's ACE inhibitor was discontinued because of CK D stage III. We restarted lisinopril and his follow-up echo showed improvement of his ejection fraction to 50-55%. Patient states today that his functional capacity is good, he denies any angina or shortness of breath. He denies any palpitations. He's very compliant he with his medications. He  has several correction regarding taking over-the-counter supplements including selenium, L-arginine, and magnesium.  Studies:  - LHC (12/2002):  No significant CAD, EF 30-35% (done after CLite demonstrated poss inf ischemia).  - Echo (09/22/13):  EF 35-40%, inferolateral and inf HK, Gr 2 DD, mild LAE.   - Nuclear (09/23/13):  Poss anteroseptal infarct, no ischemia, EF 48%; Low Risk    Recent Labs: 09/22/2013: TSH 4.110 11/22/2013: Hemoglobin 14.3 12/10/2013: Creatinine 1.9*; Potassium 3.95/10/2013:  K 4.7, creatinine 1.9, Hgb 16.1  Wt Readings from Last 3 Encounters:  04/15/14 195 lb (88.451 kg)  10/25/13 193 lb (87.544 kg)  10/05/13 192 lb (87.091 kg)     Past Medical History  Diagnosis Date  . Hyperlipidemia   . Hypertension   . Diabetes mellitus   . GERD (gastroesophageal reflux disease)   . NICM (nonischemic cardiomyopathy)     a. EF 35-40% in 2009, 55% in 2010. b. 09/2013: 35-40% by echo and 48% by nuc.  . Coronary artery disease     a. Nonobstructive by cath 2004. b. Nuc 09/2013 - no ischemia, fixed defect suggestive of possible prior infarction, felt low risk, EF 48%.   . Premature ventricular contractions   . Atrial fibrillation     a. 09/2013: RVR then bradycardia then spont conv to NSR.  . CKD (chronic kidney disease), stage III   . 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  acetaminophen (TYLENOL) 500 MG tablet Take 500 mg by mouth as needed.    [name] atorvastatin (LIPITOR) 80 MG tablet Take 80 mg by mouth daily.    . Coenzyme Q10 (COQ10) 200 MG CAPS Take 1 capsule by mouth daily.    . enalapril (VASOTEC) 5 MG tablet Take 1 tablet (5 mg total) by mouth daily. 30 tablet 11  . glimepiride (AMARYL) 2 MG tablet Take 4 mg by mouth daily before breakfast. TAKE 2 TABS IN THE BREAKFAST AND 2 TABS AT SUPPER    . hydrALAZINE (APRESOLINE) 10 MG tablet TAKE ONE TABLET BY MOUTH EVERY 8 HOURS. NEEDS APPOINTMENT FOR FURTHER  REFILLS 90 tablet 0  . L-Arginine 500 MG TABS Take 1 tablet by mouth daily.    [name] MAGNESIUM PO Take 250 mg by mouth daily.    . metoprolol (LOPRESSOR) 50 MG tablet Take 1.5 tablets (75 mg total) by mouth 2 (two) times daily. 90 tablet 0  . omeprazole (PRILOSEC) 10 MG capsule Take 10 mg by mouth daily.     . Rivaroxaban (XARELTO) 15 MG TABS tablet Take 1 tablet (15 mg total) by mouth daily with supper. 30 tablet 3  . Selenium 200 MCG CAPS Take 1 capsule by mouth daily.    . tamsulosin (FLOMAX) 0.4 MG CAPS capsule Take 0.4 mg by mouth at bedtime.     No current facility-administered medications for this visit.    Allergies:   Review of patient's allergies indicates no known allergies.   Social History:  The patient  reports that he has quit smoking. His smoking use included Cigarettes. He smoked 0.00 packs per day. He does not have any smokeless tobacco history on file.   Family History:  The patient's family history includes Diabetes in his brother and mother; Heart disease in his brother, father, and mother.   ROS:  Please see the history of present illness.   He denies bleeding problems.   All other systems reviewed and negative.   PHYSICAL EXAM: VS:  BP 126/58 mmHg  Pulse 64  Ht 5\' 11"  (1.803 m)  Wt 195 lb (88.451 kg)  BMI 27.21 kg/m2 Well nourished, well developed, in no acute distress HEENT: normal Neck: no JVD Cardiac:  normal S1, S2; RRR; no murmur Lungs:  clear to auscultation bilaterally, no wheezing, rhonchi or rales Abd: soft, nontender, no hepatomegaly Ext: no edema Skin: warm and dry Neuro:  CNs 2-12 intact, no focal abnormalities noted  EKG:  NSR, HR 74, normal axis, PVCs, nonspecific ST-T wave changes       ASSESSMENT AND PLAN:  1. Atrial fibrillation:  He remains in NSR.  He is tolerating Xarelto.    Continue current dose of beta blocker.  2. Coronary artery disease:  Presumed given WMA on echo and scar on Nuclear study, not seen on the most recent echo in  July 2015.[name]  He is asymptomatic.  Continue med Rx.  He is not on ASA as he is on Xarelto.  Continue beta blocker, statin.   3.   NICM (nonischemic cardiomyopathy):  His EF has improved to 50-55% with lisinopril. Concern is CK D stage III, we will recheck his creatinine today.     4.   Hypertension:  Controlled.   5.  HYPERLIPIDEMIA:  Continue statin.   6.  CKD (chronic kidney disease), stage III:  As above.    7. Type II diabetes mellitus with renal manifestations:  Resume ACEI as noted above.    BMP today and  follow up in 6 months.  [name]  04/15/2014 11:14 AM

## 2014-04-21 ENCOUNTER — Other Ambulatory Visit: Payer: Self-pay | Admitting: Cardiology

## 2014-05-09 ENCOUNTER — Other Ambulatory Visit: Payer: Self-pay | Admitting: Cardiology

## 2014-05-22 ENCOUNTER — Other Ambulatory Visit: Payer: Self-pay | Admitting: Cardiology

## 2014-05-23 NOTE — Telephone Encounter (Signed)
Rx(s) sent to pharmacy electronically.  

## 2014-06-03 ENCOUNTER — Other Ambulatory Visit: Payer: Self-pay | Admitting: *Deleted

## 2014-06-03 MED ORDER — ENALAPRIL MALEATE 5 MG PO TABS: 5.0000 mg | ORAL_TABLET | Freq: Every day | ORAL | Status: DC

## 2014-07-13 DIAGNOSIS — L570 Actinic keratosis: Secondary | ICD-10-CM | POA: Insufficient documentation

## 2014-08-12 ENCOUNTER — Other Ambulatory Visit: Payer: Self-pay | Admitting: *Deleted

## 2014-08-12 MED ORDER — METOPROLOL TARTRATE 50 MG PO TABS: 75.0000 mg | ORAL_TABLET | Freq: Two times a day (BID) | ORAL | Status: DC

## 2014-10-14 ENCOUNTER — Encounter: Payer: Self-pay | Admitting: Cardiology

## 2014-10-14 ENCOUNTER — Ambulatory Visit (INDEPENDENT_AMBULATORY_CARE_PROVIDER_SITE_OTHER): Payer: Medicare Other | Admitting: Cardiology

## 2014-10-14 VITALS — BP 115/60 | HR 72 | Ht 71.0 in | Wt 196.0 lb

## 2014-10-14 DIAGNOSIS — I2510 Atherosclerotic heart disease of native coronary artery without angina pectoris: Secondary | ICD-10-CM

## 2014-10-14 DIAGNOSIS — E785 Hyperlipidemia, unspecified: Secondary | ICD-10-CM

## 2014-10-14 DIAGNOSIS — I480 Paroxysmal atrial fibrillation: Secondary | ICD-10-CM

## 2014-10-14 DIAGNOSIS — I2583 Coronary atherosclerosis due to lipid rich plaque: Secondary | ICD-10-CM

## 2014-10-14 DIAGNOSIS — I428 Other cardiomyopathies: Secondary | ICD-10-CM

## 2014-10-14 DIAGNOSIS — I429 Cardiomyopathy, unspecified: Secondary | ICD-10-CM | POA: Diagnosis not present

## 2014-10-14 DIAGNOSIS — I10 Essential (primary) hypertension: Secondary | ICD-10-CM

## 2014-10-14 LAB — BASIC METABOLIC PANEL
BUN: 16 mg/dL (ref 6–23)
CO2: 24 mEq/L (ref 19–32)
Calcium: 8.7 mg/dL (ref 8.4–10.5)
Chloride: 104 mEq/L (ref 96–112)
Creat: 1.79 mg/dL — ABNORMAL HIGH (ref 0.50–1.35)
Glucose, Bld: 188 mg/dL — ABNORMAL HIGH (ref 70–99)
Potassium: 5.1 mEq/L (ref 3.5–5.3)
Sodium: 137 mEq/L (ref 135–145)

## 2014-10-14 LAB — HEPATIC FUNCTION PANEL
ALT: 16 U/L (ref 0–53)
AST: 17 U/L (ref 0–37)
Albumin: 4.1 g/dL (ref 3.5–5.2)
Alkaline Phosphatase: 88 U/L (ref 39–117)
Bilirubin, Direct: 0.1 mg/dL (ref 0.0–0.3)
Indirect Bilirubin: 0.6 mg/dL (ref 0.2–1.2)
Total Bilirubin: 0.7 mg/dL (ref 0.2–1.2)
Total Protein: 6.7 g/dL (ref 6.0–8.3)

## 2014-10-14 LAB — CBC WITH DIFFERENTIAL/PLATELET
Basophils Absolute: 0.1 10*3/uL (ref 0.0–0.1)
Basophils Relative: 1 % (ref 0–1)
Eosinophils Absolute: 0.2 10*3/uL (ref 0.0–0.7)
Eosinophils Relative: 3 % (ref 0–5)
HCT: 42.2 % (ref 39.0–52.0)
Hemoglobin: 14.7 g/dL (ref 13.0–17.0)
Lymphocytes Relative: 27 % (ref 12–46)
Lymphs Abs: 2 10*3/uL (ref 0.7–4.0)
MCH: 29.3 pg (ref 26.0–34.0)
MCHC: 34.8 g/dL (ref 30.0–36.0)
MCV: 84.1 fL (ref 78.0–100.0)
MPV: 9.6 fL (ref 8.6–12.4)
Monocytes Absolute: 0.7 10*3/uL (ref 0.1–1.0)
Monocytes Relative: 10 % (ref 3–12)
Neutro Abs: 4.3 10*3/uL (ref 1.7–7.7)
Neutrophils Relative %: 59 % (ref 43–77)
Platelets: 155 10*3/uL (ref 150–400)
RBC: 5.02 MIL/uL (ref 4.22–5.81)
RDW: 15.1 % (ref 11.5–15.5)
WBC: 7.3 10*3/uL (ref 4.0–10.5)

## 2014-10-14 NOTE — Progress Notes (Signed)
Patient ID: Erik [name], male   DOB: 09/09/1940, 74 y.o.   MRN: [id number]    Date:  10/14/2014   ID:  Htoo, Kaniecki Apr 03, 1941, MRN [id number]  PCP:  [name], MD  Cardiologist:  Dr. Jenell Milliner => Dr. Ena Dawley     Chief complain: Follow-up for decrease in LVEF.  History of Present Illness: Erik [name] is a 74 y.o. male with a hx of no significant CAD on cath in 2004, PVCs, NICM (EF 35% in past and improved to 55% in 2010), CKD, DM, HTN, HL.  He was admitted 4/28-5/1 with chest discomfort in the setting of AFib with RVR. He converted to NSR on IV dilt. Cardiac enzymes remained normal.  D-dimer was elevated and VQ scan was low probability for pulmonary embolism. He was placed on Xarelto for anticoagulation (CHADS2-VASc=3).  Echocardiogram demonstrated worsening LV function with an EF of 35-40% and inferior and inferolateral hypokinesis. Patient was set up for a nuclear study as an inpatient. This demonstrated mid to apical anteroseptal perfusion defect suggestive of possible infarct but no ischemia, EF 48%. This was felt to be a low risk study. Decision was made to treat him medically.  Of note, on enalapril was stopped in the setting of CKD.  Carbamazepine was stopped due to interaction with Xarelto.  He returns for follow up. He is doing well.  The patient denies chest pain, shortness of breath, syncope, orthopnea, PND or pedal edema.  He was told to remain off of metformin and is taking glimepiride.  04/15/2014 - this is 5 months follow-up. At the last visit in June we found out that patient's ejection fraction dropped to 35-40%. He's ACE inhibitor was discontinued because of CK D stage III. We restarted lisinopril and his follow-up echo showed improvement of his ejection fraction to 50-55%. Patient states today that his functional capacity is good, he denies any angina or shortness of breath. He denies any palpitations. He's very compliant he with his medications. He has  several correction regarding taking over-the-counter supplements including selenium, L-arginine, and magnesium.  10/14/14 - the patient denies any chest pain, DOE has improved since being seen in our clinic, no LE edema, no orthopnea, PND. He has been prescribed xanax for anxiety and has been experiencing presyncopal episodes. No syncope.  Studies:  - LHC (12/2002):  No significant CAD, EF 30-35% (done after CLite demonstrated poss inf ischemia).  - Echo (09/22/13):  EF 35-40%, inferolateral and inf HK, Gr 2 DD, mild LAE.   - Nuclear (09/23/13):  Poss anteroseptal infarct, no ischemia, EF 48%; Low Risk    Recent Labs: 11/22/2013: Hemoglobin 14.3 04/15/2014: ALT 13; Creatinine 1.7*; Potassium 4.55/10/2013:  K 4.7, creatinine 1.9, Hgb 16.1  Wt Readings from Last 3 Encounters:  10/14/14 196 lb (88.905 kg)  04/15/14 195 lb (88.451 kg)  10/25/13 193 lb (87.544 kg)     Past Medical History  Diagnosis Date  . Hyperlipidemia   . Hypertension   . Diabetes mellitus   . GERD (gastroesophageal reflux disease)   . NICM (nonischemic cardiomyopathy)     a. EF 35-40% in 2009, 55% in 2010. b. 09/2013: 35-40% by echo and 48% by nuc.  . Coronary artery disease     a. Nonobstructive by cath 2004. b. Nuc 09/2013 - no ischemia, fixed defect suggestive of possible prior infarction, felt low risk, EF 48%.   . Premature ventricular contractions   . Atrial fibrillation     a. 09/2013: RVR  then bradycardia then spont conv to NSR.  . CKD (chronic kidney disease), stage III   . 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 acetaminophen (TYLENOL) 500 MG tablet Take 500 mg by mouth as needed.    [name] atorvastatin (LIPITOR) 80 MG tablet Take 80 mg by mouth daily.    . Coenzyme Q10 (COQ10) 200 MG CAPS Take 1 capsule by mouth daily.    . enalapril (VASOTEC) 5 MG tablet Take 1 tablet (5 mg total) by mouth daily. 90 tablet 1  . glimepiride (AMARYL) 2 MG  tablet Take 4 mg by mouth daily before breakfast. TAKE 2 TABS IN THE BREAKFAST AND 2 TABS AT SUPPER    . hydrALAZINE (APRESOLINE) 10 MG tablet Take 1 tablet (10 mg total) by mouth 3 (three) times daily. 90 tablet 5  . imipramine (TOFRANIL) 50 MG tablet Take 50 mg by mouth at bedtime.   5  . L-Arginine 500 MG TABS Take 1 tablet by mouth daily.    [name] MAGNESIUM PO Take 250 mg by mouth daily.    . metoprolol (LOPRESSOR) 50 MG tablet Take 1.5 tablets (75 mg total) by mouth 2 (two) times daily. 90 tablet 2  . omeprazole (PRILOSEC) 10 MG capsule Take 10 mg by mouth daily.     . Rivaroxaban (XARELTO) 15 MG TABS tablet Take 1 tablet (15 mg total) by mouth daily with supper. 30 tablet 3  . Selenium 200 MCG CAPS Take 1 capsule by mouth daily.    . tamsulosin (FLOMAX) 0.4 MG CAPS capsule Take 0.4 mg by mouth at bedtime.     No current facility-administered medications for this visit.    Allergies:   Review of patient's allergies indicates no known allergies.   Social History:  The patient  reports that he has quit smoking. His smoking use included Cigarettes. He does not have any smokeless tobacco history on file.   Family History:  The patient's family history includes Diabetes in his brother and mother; Heart disease in his brother, father, and mother; Hypertension in his brother and mother. There is no history of Heart attack or Stroke.   ROS:  Please see the history of present illness.   He denies bleeding problems.   All other systems reviewed and negative.   PHYSICAL EXAM: VS:  BP 115/60 mmHg  Pulse 72  Ht 5\' 11"  (1.803 m)  Wt 196 lb (88.905 kg)  BMI 27.35 kg/m2 Well nourished, well developed, in no acute distress HEENT: normal Neck: no JVD Cardiac:  normal S1, S2; RRR; no murmur Lungs:  clear to auscultation bilaterally, no wheezing, rhonchi or rales Abd: soft, nontender, no hepatomegaly Ext: no edema Skin: warm and dry Neuro:  CNs 2-12 intact, no focal abnormalities noted  EKG:  NSR,  HR 74, normal axis, PVCs, nonspecific ST-T wave changes     ECHO: 12/10/2013  - Left ventricle: The cavity size was moderately dilated. Wall thickness was normal. Systolic function was normal. The estimated ejection fraction was in the range of 50% to 55%. - Left atrium: The atrium was mildly dilated.    ASSESSMENT AND PLAN:  1. Atrial fibrillation:  He remains in NSR.  He is tolerating Xarelto.    Continue current dose of beta blocker.  2. Coronary artery disease:  Presumed given WMA on echo and scar on Nuclear study, not seen on the most recent echo in July 2015.[name]  He  is asymptomatic.  Continue med Rx.  He is not on ASA as he is on Xarelto.  Continue beta blocker, statin.   3.   NICM (nonischemic cardiomyopathy):  His EF has improved to 50-55% with lisinopril, now on enalapril. Concern is CK D stage III, rechecked 6 months ago 1.7, we will recheck today.    4.   Hypertension:  Controlled.   5.  HYPERLIPIDEMIA:  Continue statin.   6.  CKD (chronic kidney disease), stage III:  As above.    7. Type II diabetes mellitus with renal manifestations:  Resume ACEI as noted above.  He is strongly advised to stop taking xanax to avoid syncope and never drive after taking it.   BMP, CBC today and follow up in 6 months.   [name]  10/14/2014 4:18 PM

## 2014-10-14 NOTE — Patient Instructions (Addendum)
Medication Instructions:  Your physician recommends that you continue on your current medications as directed. Please refer to the Current Medication list given to you today.   Labwork: Bmet, Lft, Cbc today  Testing/Procedures: None   Follow-Up: Your physician wants you to follow-up in: 6 months with Dr.Nelson You will receive a reminder letter in the mail two months in advance. If you don't receive a letter, please call our office to schedule the follow-up appointment.   Any Other Special Instructions Will Be Listed Below (If Applicable).

## 2014-10-26 ENCOUNTER — Other Ambulatory Visit: Payer: Self-pay

## 2014-10-26 MED ORDER — METOPROLOL TARTRATE 50 MG PO TABS: 75.0000 mg | ORAL_TABLET | Freq: Two times a day (BID) | ORAL | Status: DC

## 2014-12-09 ENCOUNTER — Other Ambulatory Visit: Payer: Self-pay | Admitting: Cardiology

## 2015-01-22 ENCOUNTER — Inpatient Hospital Stay (HOSPITAL_COMMUNITY)
Admission: EM | Admit: 2015-01-22 | Discharge: 2015-01-29 | DRG: 418 | Disposition: A | Discharge status: Home / Self Care | Payer: Medicare Other | Attending: Internal Medicine | Admitting: Internal Medicine

## 2015-01-22 ENCOUNTER — Emergency Department (HOSPITAL_COMMUNITY): Payer: Medicare Other

## 2015-01-22 ENCOUNTER — Encounter (HOSPITAL_COMMUNITY): Payer: Self-pay

## 2015-01-22 DIAGNOSIS — R1013 Epigastric pain: Secondary | ICD-10-CM | POA: Diagnosis present

## 2015-01-22 DIAGNOSIS — E1122 Type 2 diabetes mellitus with diabetic chronic kidney disease: Secondary | ICD-10-CM | POA: Diagnosis present

## 2015-01-22 DIAGNOSIS — E8770 Fluid overload, unspecified: Secondary | ICD-10-CM | POA: Diagnosis not present

## 2015-01-22 DIAGNOSIS — E119 Type 2 diabetes mellitus without complications: Secondary | ICD-10-CM

## 2015-01-22 DIAGNOSIS — F419 Anxiety disorder, unspecified: Secondary | ICD-10-CM | POA: Diagnosis present

## 2015-01-22 DIAGNOSIS — K859 Acute pancreatitis without necrosis or infection, unspecified: Secondary | ICD-10-CM | POA: Insufficient documentation

## 2015-01-22 DIAGNOSIS — N183 Chronic kidney disease, stage 3 unspecified: Secondary | ICD-10-CM | POA: Diagnosis present

## 2015-01-22 DIAGNOSIS — Q631 Lobulated, fused and horseshoe kidney: Secondary | ICD-10-CM | POA: Diagnosis not present

## 2015-01-22 DIAGNOSIS — R0989 Other specified symptoms and signs involving the circulatory and respiratory systems: Secondary | ICD-10-CM

## 2015-01-22 DIAGNOSIS — I429 Cardiomyopathy, unspecified: Secondary | ICD-10-CM | POA: Diagnosis present

## 2015-01-22 DIAGNOSIS — I481 Persistent atrial fibrillation: Secondary | ICD-10-CM | POA: Diagnosis not present

## 2015-01-22 DIAGNOSIS — K851 Biliary acute pancreatitis without necrosis or infection: Secondary | ICD-10-CM

## 2015-01-22 DIAGNOSIS — N281 Cyst of kidney, acquired: Secondary | ICD-10-CM | POA: Diagnosis present

## 2015-01-22 DIAGNOSIS — I129 Hypertensive chronic kidney disease with stage 1 through stage 4 chronic kidney disease, or unspecified chronic kidney disease: Secondary | ICD-10-CM | POA: Diagnosis present

## 2015-01-22 DIAGNOSIS — K8020 Calculus of gallbladder without cholecystitis without obstruction: Secondary | ICD-10-CM

## 2015-01-22 DIAGNOSIS — Z7901 Long term (current) use of anticoagulants: Secondary | ICD-10-CM | POA: Diagnosis not present

## 2015-01-22 DIAGNOSIS — Z87891 Personal history of nicotine dependence: Secondary | ICD-10-CM | POA: Diagnosis not present

## 2015-01-22 DIAGNOSIS — E785 Hyperlipidemia, unspecified: Secondary | ICD-10-CM | POA: Diagnosis present

## 2015-01-22 DIAGNOSIS — I5030 Unspecified diastolic (congestive) heart failure: Secondary | ICD-10-CM | POA: Diagnosis present

## 2015-01-22 DIAGNOSIS — Z8249 Family history of ischemic heart disease and other diseases of the circulatory system: Secondary | ICD-10-CM

## 2015-01-22 DIAGNOSIS — K8000 Calculus of gallbladder with acute cholecystitis without obstruction: Secondary | ICD-10-CM | POA: Diagnosis present

## 2015-01-22 DIAGNOSIS — N189 Chronic kidney disease, unspecified: Secondary | ICD-10-CM

## 2015-01-22 DIAGNOSIS — I482 Chronic atrial fibrillation: Secondary | ICD-10-CM

## 2015-01-22 DIAGNOSIS — I2510 Atherosclerotic heart disease of native coronary artery without angina pectoris: Secondary | ICD-10-CM | POA: Diagnosis present

## 2015-01-22 DIAGNOSIS — N1832 Chronic kidney disease, stage 3b: Secondary | ICD-10-CM | POA: Diagnosis present

## 2015-01-22 DIAGNOSIS — K219 Gastro-esophageal reflux disease without esophagitis: Secondary | ICD-10-CM | POA: Diagnosis present

## 2015-01-22 DIAGNOSIS — K593 Megacolon, not elsewhere classified: Secondary | ICD-10-CM | POA: Diagnosis present

## 2015-01-22 DIAGNOSIS — I480 Paroxysmal atrial fibrillation: Secondary | ICD-10-CM | POA: Diagnosis not present

## 2015-01-22 DIAGNOSIS — I4891 Unspecified atrial fibrillation: Secondary | ICD-10-CM | POA: Diagnosis present

## 2015-01-22 DIAGNOSIS — I10 Essential (primary) hypertension: Secondary | ICD-10-CM | POA: Diagnosis present

## 2015-01-22 DIAGNOSIS — N179 Acute kidney failure, unspecified: Secondary | ICD-10-CM | POA: Diagnosis not present

## 2015-01-22 DIAGNOSIS — I152 Hypertension secondary to endocrine disorders: Secondary | ICD-10-CM | POA: Diagnosis present

## 2015-01-22 DIAGNOSIS — R109 Unspecified abdominal pain: Secondary | ICD-10-CM

## 2015-01-22 DIAGNOSIS — R0600 Dyspnea, unspecified: Secondary | ICD-10-CM

## 2015-01-22 DIAGNOSIS — I428 Other cardiomyopathies: Secondary | ICD-10-CM

## 2015-01-22 LAB — COMPREHENSIVE METABOLIC PANEL
ALK PHOS: 73 U/L (ref 38–126)
ALT: 26 U/L (ref 17–63)
AST: 47 U/L — ABNORMAL HIGH (ref 15–41)
Albumin: 3.5 g/dL (ref 3.5–5.0)
Anion gap: 6 (ref 5–15)
BUN: 15 mg/dL (ref 6–20)
CALCIUM: 8.7 mg/dL — AB (ref 8.9–10.3)
CO2: 26 mmol/L (ref 22–32)
CREATININE: 1.75 mg/dL — AB (ref 0.61–1.24)
Chloride: 107 mmol/L (ref 101–111)
GFR calc non Af Amer: 36 mL/min — ABNORMAL LOW (ref >60)
GFR, EST AFRICAN AMERICAN: 42 mL/min — AB (ref >60)
Glucose, Bld: 287 mg/dL — ABNORMAL HIGH (ref 65–99)
Potassium: 4.3 mmol/L (ref 3.5–5.1)
SODIUM: 139 mmol/L (ref 135–145)
Total Bilirubin: 1.2 mg/dL (ref 0.3–1.2)
Total Protein: 6 g/dL — ABNORMAL LOW (ref 6.5–8.1)

## 2015-01-22 LAB — LIPASE, BLOOD: Lipase: 1940 U/L — ABNORMAL HIGH (ref 22–51)

## 2015-01-22 LAB — URINALYSIS, ROUTINE W REFLEX MICROSCOPIC
BILIRUBIN URINE: NEGATIVE
HGB URINE DIPSTICK: NEGATIVE
KETONES UR: NEGATIVE mg/dL
LEUKOCYTES UA: NEGATIVE
Nitrite: NEGATIVE
PROTEIN: NEGATIVE mg/dL
Specific Gravity, Urine: 1.013 (ref 1.005–1.030)
Urobilinogen, UA: 0.2 mg/dL (ref 0.0–1.0)
pH: 5 (ref 5.0–8.0)

## 2015-01-22 LAB — GLUCOSE, CAPILLARY
GLUCOSE-CAPILLARY: 244 mg/dL — AB (ref 65–99)
Glucose-Capillary: 220 mg/dL — ABNORMAL HIGH (ref 65–99)

## 2015-01-22 LAB — CBC
HCT: 37.2 % — ABNORMAL LOW (ref 39.0–52.0)
Hemoglobin: 12.4 g/dL — ABNORMAL LOW (ref 13.0–17.0)
MCH: 28.4 pg (ref 26.0–34.0)
MCHC: 33.3 g/dL (ref 30.0–36.0)
MCV: 85.1 fL (ref 78.0–100.0)
PLATELETS: 125 10*3/uL — AB (ref 150–400)
RBC: 4.37 MIL/uL (ref 4.22–5.81)
RDW: 13.2 % (ref 11.5–15.5)
WBC: 8.1 10*3/uL (ref 4.0–10.5)

## 2015-01-22 LAB — APTT: aPTT: 30 seconds (ref 24–37)

## 2015-01-22 LAB — LACTATE DEHYDROGENASE: LDH: 186 U/L (ref 98–192)

## 2015-01-22 LAB — URINE MICROSCOPIC-ADD ON: WBC, UA: NONE SEEN WBC/hpf (ref <3)

## 2015-01-22 LAB — CBG MONITORING, ED: Glucose-Capillary: 242 mg/dL — ABNORMAL HIGH (ref 65–99)

## 2015-01-22 LAB — HEPARIN LEVEL (UNFRACTIONATED): HEPARIN UNFRACTIONATED: 2.01 [IU]/mL — AB (ref 0.30–0.70)

## 2015-01-22 MED ORDER — INSULIN ASPART 100 UNIT/ML ~~LOC~~ SOLN
0.0000 [IU] | SUBCUTANEOUS | Status: DC
  Administered 2015-01-22 (×2): 3 [IU] via SUBCUTANEOUS
  Administered 2015-01-23: 2 [IU] via SUBCUTANEOUS
  Administered 2015-01-23 – 2015-01-25 (×7): 1 [IU] via SUBCUTANEOUS
  Administered 2015-01-25: 2 [IU] via SUBCUTANEOUS
  Administered 2015-01-25 (×2): 1 [IU] via SUBCUTANEOUS
  Administered 2015-01-26 (×3): 2 [IU] via SUBCUTANEOUS

## 2015-01-22 MED ORDER — HEPARIN (PORCINE) IN NACL 100-0.45 UNIT/ML-% IJ SOLN
1350.0000 [IU]/h | INTRAMUSCULAR | Status: DC
  Administered 2015-01-22: 1350 [IU]/h via INTRAVENOUS
  Filled 2015-01-22: qty 250

## 2015-01-22 MED ORDER — HEPARIN SODIUM (PORCINE) 5000 UNIT/ML IJ SOLN: 5000.0000 [IU] | Freq: Three times a day (TID) | INTRAMUSCULAR | Status: DC

## 2015-01-22 MED ORDER — ONDANSETRON HCL 4 MG/2ML IJ SOLN
4.0000 mg | Freq: Four times a day (QID) | INTRAMUSCULAR | Status: DC | PRN
  Administered 2015-01-22 – 2015-01-23 (×2): 4 mg via INTRAVENOUS
  Filled 2015-01-22 (×3): qty 2

## 2015-01-22 MED ORDER — SODIUM CHLORIDE 0.9 % IV BOLUS (SEPSIS)
500.0000 mL | Freq: Once | INTRAVENOUS | Status: AC
  Administered 2015-01-22: 500 mL via INTRAVENOUS

## 2015-01-22 MED ORDER — ACETAMINOPHEN 325 MG PO TABS: 650.0000 mg | ORAL_TABLET | Freq: Four times a day (QID) | ORAL | Status: DC | PRN

## 2015-01-22 MED ORDER — ACETAMINOPHEN 650 MG RE SUPP: 650.0000 mg | Freq: Four times a day (QID) | RECTAL | Status: DC | PRN

## 2015-01-22 MED ORDER — HEPARIN (PORCINE) IN NACL 100-0.45 UNIT/ML-% IJ SOLN: 1350.0000 [IU]/h | INTRAMUSCULAR | Status: DC

## 2015-01-22 MED ORDER — HYDROMORPHONE HCL 1 MG/ML IJ SOLN
0.5000 mg | INTRAMUSCULAR | Status: DC | PRN
  Administered 2015-01-22 – 2015-01-27 (×17): 1 mg via INTRAVENOUS
  Filled 2015-01-22 (×19): qty 1

## 2015-01-22 MED ORDER — METOPROLOL TARTRATE 1 MG/ML IV SOLN
2.5000 mg | Freq: Four times a day (QID) | INTRAVENOUS | Status: DC
  Administered 2015-01-22 – 2015-01-23 (×5): 2.5 mg via INTRAVENOUS
  Filled 2015-01-22 (×5): qty 5

## 2015-01-22 MED ORDER — LORAZEPAM 2 MG/ML IJ SOLN: 0.5000 mg | Freq: Four times a day (QID) | INTRAMUSCULAR | Status: DC | PRN

## 2015-01-22 MED ORDER — FENTANYL CITRATE (PF) 100 MCG/2ML IJ SOLN
50.0000 ug | Freq: Once | INTRAMUSCULAR | Status: AC
  Administered 2015-01-22: 50 ug via NASAL
  Filled 2015-01-22: qty 2

## 2015-01-22 MED ORDER — IOHEXOL 300 MG/ML  SOLN
80.0000 mL | Freq: Once | INTRAMUSCULAR | Status: AC | PRN
  Administered 2015-01-22: 80 mL via INTRAVENOUS

## 2015-01-22 MED ORDER — ONDANSETRON HCL 4 MG PO TABS
4.0000 mg | ORAL_TABLET | Freq: Four times a day (QID) | ORAL | Status: DC | PRN
Start: 2015-01-22 — End: 2015-01-29
  Administered 2015-01-22: 4 mg via ORAL
  Filled 2015-01-22: qty 1

## 2015-01-22 MED ORDER — INSULIN GLARGINE 100 UNIT/ML ~~LOC~~ SOLN
15.0000 [IU] | Freq: Every day | SUBCUTANEOUS | Status: DC
  Administered 2015-01-22 – 2015-01-26 (×5): 15 [IU] via SUBCUTANEOUS
  Filled 2015-01-22 (×7): qty 0.15

## 2015-01-22 MED ORDER — SODIUM CHLORIDE 0.9 % IV SOLN
INTRAVENOUS | Status: AC
  Administered 2015-01-22 – 2015-01-23 (×2): via INTRAVENOUS

## 2015-01-22 MED ORDER — ARTIFICIAL TEARS OP OINT
1.0000 "application " | TOPICAL_OINTMENT | Freq: Every day | OPHTHALMIC | Status: DC | PRN
  Filled 2015-01-22: qty 3.5

## 2015-01-22 MED ORDER — PANTOPRAZOLE SODIUM 40 MG IV SOLR
40.0000 mg | INTRAVENOUS | Status: DC
  Administered 2015-01-22 – 2015-01-27 (×5): 40 mg via INTRAVENOUS
  Filled 2015-01-22 (×5): qty 40

## 2015-01-22 MED ORDER — IOHEXOL 300 MG/ML  SOLN: 25.0000 mL | Freq: Once | INTRAMUSCULAR | Status: DC | PRN

## 2015-01-22 MED ORDER — ONDANSETRON HCL 4 MG/2ML IJ SOLN
4.0000 mg | Freq: Once | INTRAMUSCULAR | Status: AC
  Administered 2015-01-22: 4 mg via INTRAVENOUS
  Filled 2015-01-22: qty 2

## 2015-01-22 MED ORDER — SODIUM CHLORIDE 0.9 % IJ SOLN
3.0000 mL | Freq: Two times a day (BID) | INTRAMUSCULAR | Status: DC
  Administered 2015-01-22 – 2015-01-29 (×11): 3 mL via INTRAVENOUS

## 2015-01-22 NOTE — ED Provider Notes (Addendum)
CSN: [id number]     Arrival date & time 01/22/15  1056 History   First MD Initiated Contact with Patient 01/22/15 1142     Chief Complaint  Patient presents with  . Abdominal Pain  . Back Pain     (Consider location/radiation/quality/duration/timing/severity/associated sxs/prior Treatment) HPI Comments: Patient presents with abdominal and back pain. He states that earlier this morning he started having pain down his lower back. It starts in his mid back and goes down both sides of his lower back. It's worse with movement. He has had some similar episodes of back pain in the past. It does seem to be worse with movement. He denies any radiation down his legs. He denies any numbness or weakness to his legs. He reports now that the pain seems to be in his abdomen. He states that it's been constant since this morning. It's an achy pain that goes across his upper abdomen. He hasn't had anything to eat this morning but he did have some barbecue in a hot dog yesterday. He denies any nausea or vomiting. He did have one loose stool this morning. He denies any blood in his stool. Of note he is on Xarelto for atrial fibrillation. He had an episode of similar pain across his upper abdomen about 2 weeks ago that resolved on its own. He does not feel that it's related to eating.  Patient is a 74 y.o. male presenting with abdominal pain and back pain.  Abdominal Pain Associated symptoms: diarrhea   Associated symptoms: no chest pain, no chills, no cough, no fatigue, no fever, no hematuria, no nausea, no shortness of breath and no vomiting   Back Pain Associated symptoms: abdominal pain   Associated symptoms: no chest pain, no fever, no headaches, no numbness and no weakness     Past Medical History  Diagnosis Date  . Hyperlipidemia   . Hypertension   . Diabetes mellitus   . GERD (gastroesophageal reflux disease)   . NICM (nonischemic cardiomyopathy)     a. EF 35-40% in 2009, 55% in 2010. b. 09/2013:  35-40% by echo and 48% by nuc.  . Coronary artery disease     a. Nonobstructive by cath 2004. b. Nuc 09/2013 - no ischemia, fixed defect suggestive of possible prior infarction, felt low risk, EF 48%.   . Premature ventricular contractions   . Atrial fibrillation     a. 09/2013: RVR then bradycardia then spont conv to NSR.  . CKD (chronic kidney disease), stage III   . 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 Cardiac catheterization  10/25/00, 12/29/02   Family History  Problem Relation Age of Onset  . Heart disease Father   . Heart disease Mother   . Diabetes Mother   . Heart disease Brother   . Diabetes Brother   . Hypertension Mother   . Hypertension Brother   . Heart attack Neg Hx   . Stroke Neg Hx    Social History  Substance Use Topics  . Smoking status: Former Smoker    Types: Cigarettes  . Smokeless tobacco: None     Comment: quit 35 yrs ago  . Alcohol Use: None    Review of Systems  Constitutional: Negative for fever, chills, diaphoresis and fatigue.  HENT: Negative for congestion, rhinorrhea and sneezing.   Eyes: Negative.   Respiratory: Negative for cough, chest tightness and shortness of breath.   Cardiovascular: Negative for chest  pain and leg swelling.  Gastrointestinal: Positive for abdominal pain and diarrhea. Negative for nausea, vomiting and blood in stool.  Genitourinary: Negative for frequency, hematuria, flank pain and difficulty urinating.  Musculoskeletal: Positive for back pain. Negative for arthralgias.  Skin: Negative for rash.  Neurological: Negative for dizziness, speech difficulty, weakness, numbness and headaches.      Allergies  Review of patient's allergies indicates no known allergies.  Home Medications   Prior to Admission medications   Medication Sig Start Date End Date Taking? Authorizing Provider  acetaminophen (TYLENOL) 500 MG tablet Take 500 mg by mouth as needed.     Historical Provider, MD  atorvastatin (LIPITOR) 80 MG tablet Take 80 mg by mouth daily.    Historical Provider, MD  Coenzyme Q10 (COQ10) 200 MG CAPS Take 1 capsule by mouth daily.    Historical Provider, MD  enalapril (VASOTEC) 5 MG tablet Take 1 tablet (5 mg total) by mouth daily. 06/03/14   [name], MD  glimepiride (AMARYL) 2 MG tablet Take 4 mg by mouth daily before breakfast. TAKE 2 TABS IN THE BREAKFAST AND 2 TABS AT SUPPER    Historical Provider, MD  hydrALAZINE (APRESOLINE) 10 MG tablet TAKE 1 TABLET BY MOUTH THREE TIMES DAILY 12/09/14   [name], MD  imipramine (TOFRANIL) 50 MG tablet Take 50 mg by mouth at bedtime.  10/07/14   Historical Provider, MD  L-Arginine 500 MG TABS Take 1 tablet by mouth daily.    Historical Provider, MD  MAGNESIUM PO Take 250 mg by mouth daily.    Historical Provider, MD  metoprolol (LOPRESSOR) 50 MG tablet Take 1.5 tablets (75 mg total) by mouth 2 (two) times daily. 10/26/14   [name], MD  omeprazole (PRILOSEC) 10 MG capsule Take 10 mg by mouth daily.     Historical Provider, MD  Rivaroxaban (XARELTO) 15 MG TABS tablet Take 1 tablet (15 mg total) by mouth daily with supper. 09/24/13   Dayna N Dunn, PA-C  Selenium 200 MCG CAPS Take 1 capsule by mouth daily.    Historical Provider, MD  tamsulosin (FLOMAX) 0.4 MG CAPS capsule Take 0.4 mg by mouth at bedtime.    Historical Provider, MD   BP 154/65 mmHg  Pulse 70  Temp(Src) 97.9 F (36.6 C) (Oral)  Resp 17  Ht 5\' 10"  (1.778 m)  Wt 202 lb 11.2 oz (91.944 kg)  BMI 29.08 kg/m2  SpO2 97% Physical Exam  Constitutional: He is oriented to person, place, and time. He appears well-developed and well-nourished.  HENT:  Head: Normocephalic and atraumatic.  Eyes: Pupils are equal, round, and reactive to light.  Neck: Normal range of motion. Neck supple.  Cardiovascular: Normal rate, regular rhythm and normal heart sounds.   Pulmonary/Chest: Effort normal and breath sounds normal. No respiratory  distress. He has no wheezes. He has no rales. He exhibits no tenderness.  Abdominal: Soft. Bowel sounds are normal. There is tenderness (moderate tenderness to the epigastrium and mid abdomen). There is no rebound and no guarding.  Musculoskeletal: Normal range of motion. He exhibits no edema.  No palpable tenderness to the back or the spine. Negative straight leg raise bilaterally. He has normal sensation and motor function in the lower extremities. Pedal pulses are intact.  Lymphadenopathy:    He has no cervical adenopathy.  Neurological: He is alert and oriented to person, place, and time.  Skin: Skin is warm and dry. No rash noted.  Psychiatric: He has a normal mood  and affect.    ED Course  Procedures (including critical care time) Labs Review Labs Reviewed  LIPASE, BLOOD - Abnormal; Notable for the following:    Lipase 1940 (*)    All other components within normal limits  COMPREHENSIVE METABOLIC PANEL - Abnormal; Notable for the following:    Glucose, Bld 287 (*)    Creatinine, Ser 1.75 (*)    Calcium 8.7 (*)    Total Protein 6.0 (*)    AST 47 (*)    GFR calc non Af Amer 36 (*)    GFR calc Af Amer 42 (*)    All other components within normal limits  CBC - Abnormal; Notable for the following:    Hemoglobin 12.4 (*)    HCT 37.2 (*)    Platelets 125 (*)    All other components within normal limits  URINALYSIS, ROUTINE W REFLEX MICROSCOPIC (NOT AT [facility]) - Abnormal; Notable for the following:    Glucose, UA >1000 (*)    All other components within normal limits  CBG MONITORING, ED - Abnormal; Notable for the following:    Glucose-Capillary 242 (*)    All other components within normal limits  URINE MICROSCOPIC-ADD ON    Imaging Review Ct Abdomen Pelvis W Contrast  01/22/2015   CLINICAL DATA:  Epigastric pain and right lower quadrant pain radiating to back.  EXAM: CT ABDOMEN AND PELVIS WITH CONTRAST  TECHNIQUE: Multidetector CT imaging of the abdomen and pelvis was  performed using the standard protocol following bolus administration of intravenous contrast.  CONTRAST:  37mL OMNIPAQUE IOHEXOL 300 MG/ML  SOLN  COMPARISON:  None.  FINDINGS: Lung bases are within normal. There is calcified plaque over the left anterior descending coronary artery.  Abdominal images demonstrate mild cholelithiasis. The liver, spleen and adrenal glands are normal. Appendix is normal. Small bowel and colon are within normal.  There is moderate peripancreatic fluid as well as fluid over the right pericolic gutter. Findings are compatible with acute pancreatitis. No significant complicating features.  A horseshoe kidney is present. There is no hydronephrosis or nephrolithiasis 3.1 cm simple renal cyst over the right-sided component of the horseshoe kidney. Ureters are normal.  Mild calcified plaque over the abdominal aorta and iliac arteries.  Pelvic images demonstrate mild prominence of the prostate gland as the bladder and rectum are otherwise within normal.  There are moderate degenerative changes of the spine with prominent anterior osteophytes. Mild degenerate change of the hips.  IMPRESSION: Evidence of acute pancreatitis likely related to patient's gallbladder stone disease. No complicating features.  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 3.1 cm cyst over the right-sided component of the horseshoe kidney.  Mild prostatic enlargement.  Atherosclerotic coronary artery disease.   Electronically Signed   By: Marin Olp M.D.   On: 01/22/2015 14:43   I have personally reviewed and evaluated these images and lab results as part of my medical decision-making.   EKG Interpretation   Date/Time:  Sunday January 22 2015 11:14:31 EDT Ventricular Rate:  59 PR Interval:  166 QRS Duration: 88 QT Interval:  446 QTC Calculation: 441 R Axis:   17 Text Interpretation:  Sinus bradycardia with occasional Premature  ventricular complexes Otherwise normal ECG since last tracing HR  has  slowed. Confirmed by Kaye Luoma  MD, [place] ([id number]) on 01/22/2015 1:44:05 PM      MDM   Final diagnoses:  Abdominal pain  Gallstones  Acute biliary pancreatitis    Patient with evidence of pancreatitis. He also  has evidence of gallstones on CT scan without suggestions of Coley cystitis. His AST is mildly elevated but his other LFTs are normal.  His pain is controlled in the ED. He denies any alcohol use. I spoke with Dr. Barry Dienes with general surgery who will see the patient. She requests medicine to admit the patient. I will consult the hospitalist for admission. Spoke with M. York who will admit pt.   Malvin Johns, MD 01/22/15 [place], MD 01/22/15 [phone]

## 2015-01-22 NOTE — ED Notes (Signed)
Attempted report 

## 2015-01-22 NOTE — Progress Notes (Signed)
01/22/2015 4:26 PM  Report, received, Room ready for patient.   Dola Argyle, RN

## 2015-01-22 NOTE — ED Notes (Signed)
Pt vomited in trashcan. EDP aware.

## 2015-01-22 NOTE — Consult Note (Deleted)
Cardiologist: [name] Reason for Consult: Preop clearance Referring Physician:   GOTTLIEB [name] [name] an 74 y.o. [name].  HPI:   Erik [name] [name] with a hx of no significant CAD on cath in 2004, PVCs, NICM (EF 35% in past and improved to 55% in 2010), CKD, DM, HTN, HL. He was admitted 4/28-5/1 with chest discomfort in the setting of AFib with RVR. He converted to NSR on IV dilt. Cardiac enzymes remained normal. D-dimer was elevated and VQ scan was low probability for pulmonary embolism. He was placed on Xarelto for anticoagulation (CHADS2-VASc=3). Echocardiogram demonstrated worsening LV function with an EF of 35-40% and inferior and inferolateral hypokinesis. Patient was set up for a nuclear study as an inpatient. This demonstrated mid to apical anteroseptal perfusion defect suggestive of possible infarct but no ischemia, EF 48%. This was felt to be a low risk study. Decision was made to treat him medically. Of note, enalapril was stopped in the setting of CKD. Carbamazepine was stopped due to interaction with Xarelto.  04/15/2014 - 5 month follow-up. At the last visit in June we found out that patient's ejection fraction dropped to 35-40%. He's ACE inhibitor was discontinued because of CK D stage III. We restarted lisinopril and his follow-up echo showed improvement of his ejection fraction to 50-55%.   Patient was seen 10/14/2014 by Dr. [name] and was doing well. He had no complaint of chest pain at that time.  Studies: - LHC (12/2002): No significant CAD, EF 30-35% (done after CLite demonstrated poss inf ischemia). - Echo (09/22/13): EF 35-40%, inferolateral and inf HK, Gr 2 DD, mild LAE.  - Nuclear (09/23/13): Poss anteroseptal infarct, no ischemia, EF 48%; Low Risk  -Echo (12/10/2013): EF 50-55%, LV moderately dilated. Left atrium moderately dilated  Patient presents with abdominal and back pain.  Patient has acute pancreatitis secondary to gallstones. We were  asked for preoperative clearance.  Past Medical History  Diagnosis Date  . Hyperlipidemia   . Hypertension   . Diabetes mellitus   . GERD (gastroesophageal reflux disease)   . NICM (nonischemic cardiomyopathy)     a. EF 35-40% in 2009, 55% in 2010. b. 09/2013: 35-40% by echo and 48% by nuc.  . Coronary artery disease     a. Nonobstructive by cath 2004. b. Nuc 09/2013 - no ischemia, fixed defect suggestive of possible prior infarction, felt low risk, EF 48%.   . Premature ventricular contractions   . Atrial fibrillation     a. 09/2013: RVR then bradycardia then spont conv to NSR.  . CKD (chronic kidney disease), stage III   . 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 Cardiac catheterization  10/25/00, 12/29/02    Family History  Problem Relation Age of Onset  . Heart disease Father   . Heart disease Mother   . Diabetes Mother   . Heart disease Brother   . Diabetes Brother   . Hypertension Mother   . Hypertension Brother   . Heart attack Neg Hx   . Stroke Neg Hx     Social History:  reports that he has quit smoking. His smoking use included Cigarettes. He does not have any smokeless tobacco history on file. His alcohol and drug histories are not on file.  Allergies:  Allergies  Allergen Reactions  . Claritin [Loratadine] Other (See Comments)    Urinary retention  . Tradjenta [Linagliptin] Other (  See Comments)    High blood sugar    Medications:  Medication Sig  acetaminophen (TYLENOL) 500 MG tablet Take 500-1,000 mg by mouth daily as needed for mild pain.   Artificial Tear Ointment (DRY EYES OP) Place 1 drop into both eyes daily as needed (for dry eyes).  atorvastatin (LIPITOR) 40 MG tablet Take 40 mg by mouth daily at 6 PM.   clonazePAM (KLONOPIN) 0.5 MG tablet Take 0.5 mg by mouth daily.  Coenzyme Q10 (COQ10) 200 MG CAPS Take 100 mg by mouth daily.   enalapril (VASOTEC) 5 MG tablet Take 1 tablet (5 mg total) by  mouth daily.  hydrALAZINE (APRESOLINE) 10 MG tablet TAKE 1 TABLET BY MOUTH THREE TIMES DAILY  metoprolol (LOPRESSOR) 50 MG tablet Take 1.5 tablets (75 mg total) by mouth 2 (two) times daily.  omeprazole (PRILOSEC) 10 MG capsule Take 20 mg by mouth daily.   Rivaroxaban (XARELTO) 15 MG TABS tablet Take 1 tablet (15 mg total) by mouth daily with supper.  tamsulosin (FLOMAX) 0.4 MG CAPS capsule Take 0.4 mg by mouth at bedtime.  TOUJEO SOLOSTAR 300 UNIT/ML SOPN INJECT 38 UNITS INTO THE SKIN UPTITRATING QOD BY 2 UNITS UTD (started at 20 units)     Results for orders placed or performed during the hospital encounter of 01/22/15 (from the past 48 hour(s))  Urinalysis, Routine w reflex microscopic (not at [facility])     Status: Abnormal   Collection Time: 01/22/15 11:26 AM  Result Value Ref Range   Color, Urine YELLOW YELLOW   APPearance CLEAR CLEAR   Specific Gravity, Urine 1.013 1.005 - 1.030   pH 5.0 5.0 - 8.0   Glucose, UA >1000 (A) NEGATIVE mg/dL   Hgb urine dipstick NEGATIVE NEGATIVE   Bilirubin Urine NEGATIVE NEGATIVE   Ketones, ur NEGATIVE NEGATIVE mg/dL   Protein, ur NEGATIVE NEGATIVE mg/dL   Urobilinogen, UA 0.2 0.0 - 1.0 mg/dL   Nitrite NEGATIVE NEGATIVE   Leukocytes, UA NEGATIVE NEGATIVE  Urine microscopic-add on     Status: None   Collection Time: 01/22/15 11:26 AM  Result Value Ref Range   WBC, UA  <3 WBC/hpf    NO FORMED ELEMENTS SEEN ON URINE MICROSCOPIC EXAMINATION  Lipase, blood     Status: Abnormal   Collection Time: 01/22/15 11:55 AM  Result Value Ref Range   Lipase 1940 (H) 22 - 51 U/L    Comment: RESULTS CONFIRMED BY MANUAL DILUTION  Comprehensive metabolic panel     Status: Abnormal   Collection Time: 01/22/15 11:55 AM  Result Value Ref Range   Sodium 139 135 - 145 mmol/L   Potassium 4.3 3.5 - 5.1 mmol/L   Chloride 107 101 - 111 mmol/L   CO2 26 22 - 32 mmol/L   Glucose, Bld 287 (H) 65 - 99 mg/dL   BUN 15 6 - 20 mg/dL   Creatinine, Ser 1.75 (H) 0.61 - 1.24 mg/dL     Calcium 8.7 (L) 8.9 - 10.3 mg/dL   Total Protein 6.0 (L) 6.5 - 8.1 g/dL   Albumin 3.5 3.5 - 5.0 g/dL   AST 47 (H) 15 - 41 U/L   ALT 26 17 - 63 U/L   Alkaline Phosphatase 73 38 - 126 U/L   Total Bilirubin 1.2 0.3 - 1.2 mg/dL   GFR calc non Af Amer 36 (L) >60 mL/min   GFR calc Af Amer 42 (L) >60 mL/min    Comment: (NOTE) The eGFR has been calculated using the CKD EPI equation.  This calculation has not been validated in all clinical situations. eGFR's persistently <60 mL/min signify possible Chronic Kidney Disease.    Anion gap 6 5 - 15  CBC     Status: Abnormal   Collection Time: 01/22/15 11:55 AM  Result Value Ref Range   WBC 8.1 4.0 - 10.5 K/uL   RBC 4.37 4.22 - 5.81 MIL/uL   Hemoglobin 12.4 (L) 13.0 - 17.0 g/dL   HCT 37.2 (L) 39.0 - 52.0 %   MCV 85.1 78.0 - 100.0 fL   MCH 28.4 26.0 - 34.0 pg   MCHC 33.3 30.0 - 36.0 g/dL   RDW 13.2 11.5 - 15.5 %   Platelets 125 (L) 150 - 400 K/uL  CBG monitoring, ED     Status: Abnormal   Collection Time: 01/22/15 12:25 PM  Result Value Ref Range   Glucose-Capillary 242 (H) 65 - 99 mg/dL    Ct Abdomen Pelvis W Contrast  01/22/2015   CLINICAL DATA:  Epigastric pain and right lower quadrant pain radiating to back.  EXAM: CT ABDOMEN AND PELVIS WITH CONTRAST  TECHNIQUE: Multidetector CT imaging of the abdomen and pelvis was performed using the standard protocol following bolus administration of intravenous contrast.  CONTRAST:  65m OMNIPAQUE IOHEXOL 300 MG/ML  SOLN  COMPARISON:  None.  FINDINGS: Lung bases are within normal. There [name] calcified plaque over the left anterior descending coronary artery.  Abdominal images demonstrate mild cholelithiasis. The liver, spleen and adrenal glands are normal. Appendix [name] normal. Small bowel and colon are within normal.  There [name] moderate peripancreatic fluid as well as fluid over the right pericolic gutter. Findings are compatible with acute pancreatitis. No significant complicating features.  A horseshoe  kidney [name] present. There [name] no hydronephrosis or nephrolithiasis 3.1 cm simple renal cyst over the right-sided component of the horseshoe kidney. Ureters are normal.  Mild calcified plaque over the abdominal aorta and iliac arteries.  Pelvic images demonstrate mild prominence of the prostate gland as the bladder and rectum are otherwise within normal.  There are moderate degenerative changes of the spine with prominent anterior osteophytes. Mild degenerate change of the hips.  IMPRESSION: Evidence of acute pancreatitis likely related to patient's gallbladder stone disease. No complicating features.  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 3.1 cm cyst over the right-sided component of the horseshoe kidney.  Mild prostatic enlargement.  Atherosclerotic coronary artery disease.   Electronically Signed   By: DMarin OlpM.D.   On: 01/22/2015 14:43    ROS Blood pressure 135/69, pulse 97, temperature 97.9 F (36.6 C), temperature source Oral, resp. rate 13, height 5' 10"  (1.778 m), weight 202 lb 11.2 oz (91.944 kg), SpO2 96 %. Physical Exam  Assessment/Plan:   Erik [name] [name] 01/22/2015, 4:54 PM

## 2015-01-22 NOTE — Progress Notes (Signed)
01/22/2015 7:00 PM   New Admission Note:   Arrival Method: Via stretcher from the ED with EMT  Mental Orientation: Alert and Oriented X4 Telemetry: Placed on box 6E23 per MD orders Assessment: Completed Skin: Warm, dry and intact. Red rash around mouth and upper cheeks  IV: Clean, dry and intact. Normal Saline and Heparin drip infusing Pain: No pain stated at this time  Tubes: N/A (patient does have a rod in left arm from old surgery, limited movement) Safety Measures: Safety Fall Prevention Plan has been given, discussed. Admission: Completed [facility] Orientation: Patient has been orientated to the room, unit and staff.  Family: Son at bedside   Orders have been reviewed and implemented. Will continue to monitor the patient. Call light has been placed within reach and bed alarm has been activated.   [employer], RN-BC, RN3 [facility]  Phone number: [phone]

## 2015-01-22 NOTE — H&P (Signed)
[facility] History and Physical                                                                                    Erik [name], is a 73 y.o. male  MRN: [id number]   DOB - 1940/11/02  Admit Date - 01/22/2015  Outpatient Primary MD for the patient is [name], MD  Referring Physician:  Dr. Tamera Punt, EDP  Chief Complaint:   Chief Complaint  Patient presents with  . Abdominal Pain  . Back Pain     HPI  Erik [name]  is a 74 y.o. male, with diabetes mellitus, coronary artery disease, atrial fibrillation on Eliquis, and chronic kidney disease who presents to the emergency department with abdominal and back pain. The patient reports he initially felt abdominal and back pain 2 weeks ago. It was located in his epigastrium and extended down each side of his abdomen and back. "I hurt all over".  He felt this pain was anxiety and went to his primary care physician for anxiety medication. He was prescribed Valium and taking it seemed to help. Last night at midnight he ate barbecue and went to bed. This morning at 7 AM he awoke with a burning sensation in his stomach. After a bowel movement there was some improvement, but after going back to bed his pain became severe. It radiated from his epigastrium down both the right and left sides of the stomach through to his back. He reports that the pain became worse with food intake. He tried Tylenol and Pepto-Bismol without relief.  The patient reports he started taking a new medication, tradjenta, approximately 2 weeks ago.  In the emergency department his lipase is 1940, and CT scan shows acute pancreatitis as well as cholelithiasis. His creatinine is elevated but at baseline. WBC is not elevated, AST is 47 ALT 26 total bilirubin 1.2 alkaline phosphatase 73.  Review of Systems   In addition to the HPI above,  No Fever-chills, No Headache, No changes with Vision or hearing, No problems swallowing food or Liquids, No Cough or Shortness of  Breath, No Blood in stool or Urine, No dysuria, No new skin rashes or bruises, No new joints pains-aches,  No new weakness, tingling, numbness in any extremity, No recent weight gain or loss, A full 10 point Review of Systems was done, except as stated above, all other Review of Systems were negative.  Past Medical History  Past Medical History  Diagnosis Date  . Hyperlipidemia   . Hypertension   . Diabetes mellitus   . GERD (gastroesophageal reflux disease)   . NICM (nonischemic cardiomyopathy)     a. EF 35-40% in 2009, 55% in 2010. b. 09/2013: 35-40% by echo and 48% by nuc.  . Coronary artery disease     a. Nonobstructive by cath 2004. b. Nuc 09/2013 - no ischemia, fixed defect suggestive of possible prior infarction, felt low risk, EF 48%.   . Premature ventricular contractions   . Atrial fibrillation     a. 09/2013: RVR then bradycardia then spont conv to NSR.  . CKD (chronic kidney disease), stage III   . 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 Cardiac catheterization  10/25/00, 12/29/02      Social History Social History  Substance Use Topics  . Smoking status: Former Smoker    Types: Cigarettes  . Smokeless tobacco: Not on file     Comment: quit 35 yrs ago  . Alcohol Use: Not on file   social alcohol use quit 30 years ago. Lives at home. Independent with ADLs.  Family History Family History  Problem Relation Age of Onset  . Heart disease Father   . Heart disease Mother   . Diabetes Mother   . Heart disease Brother   . Diabetes Brother   . Hypertension Mother   . Hypertension Brother   . Heart attack Neg Hx   . Stroke Neg Hx     Prior to Admission medications   Medication Sig Start Date End Date Taking? Authorizing Provider  acetaminophen (TYLENOL) 500 MG tablet Take 500 mg by mouth as needed.    Historical Provider, MD  atorvastatin (LIPITOR) 40 MG tablet Take 40 mg by mouth daily. 12/26/14    Historical Provider, MD  atorvastatin (LIPITOR) 80 MG tablet Take 80 mg by mouth daily.    Historical Provider, MD  clonazePAM (KLONOPIN) 0.5 MG tablet Take 0.5 mg by mouth daily. 01/02/15   Historical Provider, MD  Coenzyme Q10 (COQ10) 200 MG CAPS Take 1 capsule by mouth daily.    Historical Provider, MD  enalapril (VASOTEC) 5 MG tablet Take 1 tablet (5 mg total) by mouth daily. 06/03/14   [name], MD  glimepiride (AMARYL) 4 MG tablet Take 4 mg by mouth 2 (two) times daily. 12/09/14   Historical Provider, MD  hydrALAZINE (APRESOLINE) 10 MG tablet TAKE 1 TABLET BY MOUTH THREE TIMES DAILY 12/09/14   [name], MD  imipramine (TOFRANIL) 50 MG tablet Take 50 mg by mouth at bedtime.  10/07/14   Historical Provider, MD  L-Arginine 500 MG TABS Take 1 tablet by mouth daily.    Historical Provider, MD  MAGNESIUM PO Take 250 mg by mouth daily.    Historical Provider, MD  metoprolol (LOPRESSOR) 50 MG tablet Take 1.5 tablets (75 mg total) by mouth 2 (two) times daily. 10/26/14   [name], MD  omeprazole (PRILOSEC) 10 MG capsule Take 10 mg by mouth daily.     Historical Provider, MD  Rivaroxaban (XARELTO) 15 MG TABS tablet Take 1 tablet (15 mg total) by mouth daily with supper. 09/24/13   Dayna N Dunn, PA-C  Selenium 200 MCG CAPS Take 1 capsule by mouth daily.    Historical Provider, MD  tamsulosin (FLOMAX) 0.4 MG CAPS capsule Take 0.4 mg by mouth at bedtime.    Historical Provider, MD  TOUJEO SOLOSTAR 300 UNIT/ML SOPN INJECT 20 UNITS INTO THE SKIN UPTITRATING QOD BY 2 UNITS UTD 01/09/15   Historical Provider, MD  TRADJENTA 5 MG TABS tablet Take 5 mg by mouth daily. 01/11/15   Historical Provider, MD    Allergies  Allergen Reactions  . Claritin [Loratadine] Other (See Comments)    Urinary retention    Physical Exam  Vitals  Blood pressure 164/55, pulse 82, temperature 97.9 F (36.6 C), temperature source Oral, resp. rate 26, height 5\' 10"  (1.778 m), weight 91.944 kg (202 lb 11.2 oz),  SpO2 97 %.   General: 74 yo male, HOH, pleasant,  lying in bed in NAD, son at bedside  Psych:  Normal affect and insight, Not Suicidal or Homicidal,  Awake Alert, Oriented X 3.  Neuro:   No F.N deficits, ALL C.Nerves Intact, Strength 5/5 all 4 extremities, Sensation intact all 4 extremities.  ENT:  Ears and Eyes appear Normal, Conjunctivae clear, PER. Moist oral mucosa without erythema or exudates.  Neck:  Supple, No lymphadenopathy appreciated  Respiratory:  Symmetrical chest wall movement, Good air movement bilaterally, CTAB.  Cardiac:  RRR, No Murmurs, no JVD, trace edema  Abdomen:  Positive bowel sounds, Soft, tender to deep palpation in the right upper quadrant and epigastric area.  Skin:  No Cyanosis, Normal Skin Turgor, no Bruise. Erythematous raised rash on face near lips and right nasolabial fold area.  Extremities:  Able to move all 4. 5/5 strength in each,  no effusions.  Data Review  CBC  Recent Labs Lab 01/22/15 1155  WBC 8.1  HGB 12.4*  HCT 37.2*  PLT 125*  MCV 85.1  MCH 28.4  MCHC 33.3  RDW 13.2    Chemistries   Recent Labs Lab 01/22/15 1155  NA 139  K 4.3  CL 107  CO2 26  GLUCOSE 287*  BUN 15  CREATININE 1.75*  CALCIUM 8.7*  AST 47*  ALT 26  ALKPHOS 73  BILITOT 1.2   Results for ALEXANDERJAMES, IDA (MRN [id number]) as of 01/22/2015 16:16  Ref. Range 04/15/2014 11:42 10/14/2014 16:44 01/22/2015 11:55  Lipase Latest Ref Range: 22-51 U/L   1940 (H)    Urinalysis    Component Value Date/Time   COLORURINE YELLOW 01/22/2015 [place] 01/22/2015 1126   LABSPEC 1.013 01/22/2015 1126   PHURINE 5.0 01/22/2015 1126   GLUCOSEU >1000* 01/22/2015 1126   HGBUR NEGATIVE 01/22/2015 1126   [place] 01/22/2015 1126   KETONESUR NEGATIVE 01/22/2015 [place] 01/22/2015 1126   UROBILINOGEN 0.2 01/22/2015 1126   NITRITE NEGATIVE 01/22/2015 1126   LEUKOCYTESUR NEGATIVE 01/22/2015 1126    Imaging results:    Ct Abdomen Pelvis W Contrast  01/22/2015   CLINICAL DATA:  Epigastric pain and right lower quadrant pain radiating to back.  EXAM: CT ABDOMEN AND PELVIS WITH CONTRAST  TECHNIQUE: Multidetector CT imaging of the abdomen and pelvis was performed using the standard protocol following bolus administration of intravenous contrast.  CONTRAST:  53mL OMNIPAQUE IOHEXOL 300 MG/ML  SOLN  COMPARISON:  None.  FINDINGS: Lung bases are within normal. There is calcified plaque over the left anterior descending coronary artery.  Abdominal images demonstrate mild cholelithiasis. The liver, spleen and adrenal glands are normal. Appendix is normal. Small bowel and colon are within normal.  There is moderate peripancreatic fluid as well as fluid over the right pericolic gutter. Findings are compatible with acute pancreatitis. No significant complicating features.  A horseshoe kidney is present. There is no hydronephrosis or nephrolithiasis 3.1 cm simple renal cyst over the right-sided component of the horseshoe kidney. Ureters are normal.  Mild calcified plaque over the abdominal aorta and iliac arteries.  Pelvic images demonstrate mild prominence of the prostate gland as the bladder and rectum are otherwise within normal.  There are moderate degenerative changes of the spine with prominent anterior osteophytes. Mild degenerate change of the hips.  IMPRESSION: Evidence of acute pancreatitis likely related to patient's gallbladder stone disease. No complicating features.  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 3.1 cm cyst over the right-sided component of the horseshoe kidney.  Mild prostatic enlargement.  Atherosclerotic coronary artery disease.   Electronically Signed   By: Marin Olp M.D.   On:  01/22/2015 14:43    My personal review of EKG: sinus brady, no prolonged QT.   Assessment & Plan  Principal Problem:   Acute pancreatitis Active Problems:   Diabetes mellitus   CKD (chronic kidney  disease), stage III   Atrial fibrillation   NICM (nonischemic cardiomyopathy)   Hypertension   GERD (gastroesophageal reflux disease)   Acute pancreatitis Likely secondary to gallstones but also could be exacerbated by new medication (Tradjenta) Surgery has been consulted by the emergency department physician Patient will be made nothing by mouth and treated supportively with IV fluids, Zofran, Dilaudid. Hold Xeralto in anticipation of surgery.  Heparin per pharmacy for afib. We will be judicious with IV fluids as the patient has a significant history of heart failure. Cardiology has been consultated for surgical clearance.  Diabetes Mellitus Hold oral medications including tradjenta. Will place on lantus 15 units and SSI q 4 hours as he will be NPO. Insulin will need to be titrated.  Atrial Fibrillation Rate controlled with metoprolol.  Will continue metoprolol IV q 6 hours with holding parameters Hold Xeralto.  Start Heparin per pharmacy.  Chronic Kidney Disease stage 3 Creatinine is at baseline.  GERD IV Protonix  HTN Continue Metoprolol IV (dose may need titration) Holding oral medications including enalapril (due to acute pancreatitis and chronic kidney disease)  Anxiety Holding clonazepam.  Ativan IV PRN.   Consultants Called:  Cardiology, General Surgery, Dr. Barry Dienes  Family Communication:   Son at bedside.  Code Status:  Full  Condition:  Guarded.  Potential Disposition: To home in estimated 4-5 days.  Time spent in minutes : [address],  PA-C on 01/22/2015 at 4:15 PM Between 7am to 7pm - Pager - [phone] After 7pm go to [email or web address] - password [email or web address] And look for the night coverage person covering me after hours  [facility]

## 2015-01-22 NOTE — ED Notes (Signed)
Onset 2 days red flat rash around nose, cheeks, mouth, swelling around eyes, pt thinks might be due to new medication.  PCP prescribed cream for itching, not effective.  Onset yesterday right lower back pain that radiated around back across lower back back to right lower back.  Onset this morning constant epigastric pain.  No nausea/vomiting.  BM x 2 this morning, last stoo. Was loose and green.  Pt took Pepto with no relief.

## 2015-01-22 NOTE — Care Management Note (Signed)
Case Management Note  Patient Details  Name: JARRARD HINDLEY MRN: [id number] Date of Birth: 1940/12/07  Subjective/Objective:   74 y.o. M admitted with Pancreatitis who will be followed by surgery. Lives alone in private residence.                  Action/Plan:No CM needs at present. Will follow for CM needs.    Expected Discharge Date:                  Expected Discharge Plan:     In-House Referral:     Discharge planning Services  CM Consult  Post Acute Care Choice:    Choice offered to:     DME Arranged:    DME Agency:     HH Arranged:    HH Agency:     Status of Service:  In process, will continue to follow  Medicare Important Message Given:    Date Medicare IM Given:    Medicare IM give by:    Date Additional Medicare IM Given:    Additional Medicare Important Message give by:     If discussed at [place] of Stay Meetings, dates discussed:    Additional Comments:  Delrae Sawyers, RN 01/22/2015, 4:44 PM

## 2015-01-22 NOTE — Consult Note (Signed)
Admit date: 01/22/2015 Referring Physician  Dr. Tamera Punt Primary Physician  Dr. Osborne Casco Primary Cardiologist  Dr. [name] Reason for Consultation  Preoperative cardiac clearance  HPI: Erik [name] is a 74 y.o. male with a hx of no significant CAD on cath in 2004, PVCs, NICM (EF 35% in past and improved to 55% in 2010), CKD, DM, HTN, HL. He was admitted 4/28-5//2015 with chest discomfort in the setting of AFib with RVR. He converted to NSR on IV dilt. Cardiac enzymes remained normal. D-dimer was elevated and VQ scan was low probability for pulmonary embolism. He was placed on Xarelto for anticoagulation (CHADS2-VASc=3). Echocardiogram demonstrated worsening LV function with an EF of 35-40% and inferior and inferolateral hypokinesis. Patient was set up for a nuclear study as an inpatient. This demonstrated mid to apical anteroseptal perfusion defect suggestive of possible infarct but no ischemia, EF 48%. This was felt to be a low risk study. Decision was made to treat him medically. Of note, enalapril was stopped in the setting of CKD. Carbamazepine was stopped due to interaction with Xarelto.  04/15/2014 - 5 month f follow-up echo showed improvement of his ejection fraction to 50-55%. Patient was seen 10/14/2014 by Dr. [name] and was doing well. He had no complaint of chest pain at that time.  Studies: - LHC (12/2002): No significant CAD, EF 30-35% (done after CLite demonstrated poss inf ischemia). - Echo (09/22/13): EF 35-40%, inferolateral and inf HK, Gr 2 DD, mild LAE.  - Nuclear (09/23/13): Poss anteroseptal infarct, no ischemia, EF 48%; Low Risk  -Echo (12/10/2013): EF 50-55%, LV moderately dilated. Left atrium moderately dilated  Patient presents with abdominal and back pain.  Patient has acute pancreatitis secondary to gallstones. We were asked for preoperative clearance.  He denies any chest pain or pressure.  He denies any SOB, DOE, LE edema, dizziness, palpitations or  syncope.      PMH:   Past Medical History  Diagnosis Date  . Hyperlipidemia   . Hypertension   . Diabetes mellitus   . GERD (gastroesophageal reflux disease)   . NICM (nonischemic cardiomyopathy)     a. EF 35-40% in 2009, 55% in 2010. b. 09/2013: 35-40% by echo and 48% by nuc.  . Coronary artery disease     a. Nonobstructive by cath 2004. b. Nuc 09/2013 - no ischemia, fixed defect suggestive of possible prior infarction, felt low risk, EF 48%.   . Premature ventricular contractions   . Atrial fibrillation     a. 09/2013: RVR then bradycardia then spont conv to NSR.  . CKD (chronic kidney disease), stage III   . 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 Cardiac catheterization  10/25/00, 12/29/02    Allergies:  Claritin and Tradjenta Prior to Admit Meds:   Prescriptions prior to admission  Medication Sig Dispense Refill Last Dose  . acetaminophen (TYLENOL) 500 MG tablet Take 500-1,000 mg by mouth daily as needed for mild pain.    01/21/2015  . Artificial Tear Ointment (DRY EYES OP) Place 1 drop into both eyes daily as needed (for dry eyes).   Past Month at Unknown time  . atorvastatin (LIPITOR) 40 MG tablet Take 40 mg by mouth daily at 6 PM.   3 01/21/2015 at Unknown time  . clonazePAM (KLONOPIN) 0.5 MG tablet Take 0.5 mg by mouth daily.  0 01/22/2015 at Unknown time  . Coenzyme Q10 (COQ10)  200 MG CAPS Take 100 mg by mouth daily.    01/22/2015 at Unknown time  . enalapril (VASOTEC) 5 MG tablet Take 1 tablet (5 mg total) by mouth daily. 90 tablet 1 01/22/2015 at Unknown time  . hydrALAZINE (APRESOLINE) 10 MG tablet TAKE 1 TABLET BY MOUTH THREE TIMES DAILY 90 tablet 5 01/22/2015 at Unknown time  . metoprolol (LOPRESSOR) 50 MG tablet Take 1.5 tablets (75 mg total) by mouth 2 (two) times daily. 90 tablet 3 01/22/2015 at 0900  . omeprazole (PRILOSEC) 10 MG capsule Take 20 mg by mouth daily.    01/22/2015 at Unknown time  . Rivaroxaban  (XARELTO) 15 MG TABS tablet Take 1 tablet (15 mg total) by mouth daily with supper. 30 tablet 3 01/21/2015 at Unknown time  . tamsulosin (FLOMAX) 0.4 MG CAPS capsule Take 0.4 mg by mouth at bedtime.   01/21/2015 at Unknown time  . TOUJEO SOLOSTAR 300 UNIT/ML SOPN INJECT 38 UNITS INTO THE SKIN UPTITRATING QOD BY 2 UNITS UTD (started at 20 units)  3 01/22/2015 at Unknown time   Fam HX:    Family History  Problem Relation Age of Onset  . Heart disease Father   . Heart disease Mother   . Diabetes Mother   . Heart disease Brother   . Diabetes Brother   . Hypertension Mother   . Hypertension Brother   . Heart attack Neg Hx   . Stroke Neg Hx    Social HX:    Social History   Social History  . Marital Status: Married    Spouse Name: N/A  . Number of Children: N/A  . Years of Education: N/A   Occupational History  . retired     from [employer]   Social History Main Topics  . Smoking status: Former Smoker    Types: Cigarettes  . Smokeless tobacco: Not on file     Comment: quit 35 yrs ago  . Alcohol Use: Not on file  . Drug Use: Not on file  . Sexual Activity: Not on file   Other Topics Concern  . Not on file   Social History Narrative     ROS:  All 11 ROS were addressed and are negative except what is stated in the HPI  Physical Exam: Blood pressure 135/69, pulse 97, temperature 97.9 F (36.6 C), temperature source Oral, resp. rate 13, height 5\' 10"  (1.778 m), weight 202 lb 11.2 oz (91.944 kg), SpO2 96 %.    General: Well developed, well nourished, in no acute distress Head: Eyes PERRLA, No xanthomas.   Normal cephalic and atramatic  Lungs:   Clear bilaterally to auscultation and percussion. Heart:   HRRR S1 S2 Pulses are 2+ & equal.            No carotid bruit. No JVD.  No abdominal bruits. No femoral bruits. Abdomen: Bowel sounds are positive, abdomen soft and non-tender without masses Extremities:   No clubbing, cyanosis or edema.  DP +1 Neuro: Alert and oriented X  3. Psych:  Good affect, responds appropriately    Labs:   Lab Results  Component Value Date   WBC 8.1 01/22/2015   HGB 12.4* 01/22/2015   HCT 37.2* 01/22/2015   MCV 85.1 01/22/2015   PLT 125* 01/22/2015    Recent Labs Lab 01/22/15 1155  NA 139  K 4.3  CL 107  CO2 26  BUN 15  CREATININE 1.75*  CALCIUM 8.7*  PROT 6.0*  BILITOT 1.2  ALKPHOS 73  ALT 26  AST 47*  GLUCOSE 287*   No results found for: PTT Lab Results  Component Value Date   INR 0.98 09/21/2013   INR 1.1 08/26/2007   Lab Results  Component Value Date   TROPONINI <0.30 09/23/2013    No results found for: CHOL No results found for: HDL No results found for: LDLCALC No results found for: TRIG No results found for: CHOLHDL No results found for: LDLDIRECT    Radiology:  Ct Abdomen Pelvis W Contrast  01/22/2015   CLINICAL DATA:  Epigastric pain and right lower quadrant pain radiating to back.  EXAM: CT ABDOMEN AND PELVIS WITH CONTRAST  TECHNIQUE: Multidetector CT imaging of the abdomen and pelvis was performed using the standard protocol following bolus administration of intravenous contrast.  CONTRAST:  56mL OMNIPAQUE IOHEXOL 300 MG/ML  SOLN  COMPARISON:  None.  FINDINGS: Lung bases are within normal. There is calcified plaque over the left anterior descending coronary artery.  Abdominal images demonstrate mild cholelithiasis. The liver, spleen and adrenal glands are normal. Appendix is normal. Small bowel and colon are within normal.  There is moderate peripancreatic fluid as well as fluid over the right pericolic gutter. Findings are compatible with acute pancreatitis. No significant complicating features.  A horseshoe kidney is present. There is no hydronephrosis or nephrolithiasis 3.1 cm simple renal cyst over the right-sided component of the horseshoe kidney. Ureters are normal.  Mild calcified plaque over the abdominal aorta and iliac arteries.  Pelvic images demonstrate mild prominence of the prostate  gland as the bladder and rectum are otherwise within normal.  There are moderate degenerative changes of the spine with prominent anterior osteophytes. Mild degenerate change of the hips.  IMPRESSION: Evidence of acute pancreatitis likely related to patient's gallbladder stone disease. No complicating features.  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 3.1 cm cyst over the right-sided component of the horseshoe kidney.  Mild prostatic enlargement.  Atherosclerotic coronary artery disease.   Electronically Signed   By: Marin Olp M.D.   On: 01/22/2015 14:43    EKG:  Sinus bradycardia with PVC's  ASSESSMENT/PLAN:  1.  Acute gallstone pancreatitis - plan is for lap cholecystectomy.   2.  H/O Nonischemic DCM with most recent echo showing EF 50%.  He has not had any symptoms of volume overload and appears euvolemic on exam.  No significant CAD on cath in 2004 and nuclear stress test 09/2013 with prior infarct that appeared similar to prior nuclear stress test that prompted cath in 2004.  He has no anginal symptoms.  Will recheck 2D echo to make sure EF remains preserved.  He is able to walk over 4 blocks and work outside [place] his lawn without any cardiac symptoms.  If EF ok on echo then he is low risk from cardiac standpoint for surgery and should proceed. 3.  PAF maintaining sinus bradycardai on systemic anticoagulation.  Ok to hold Xarelto for preparation for surgery.  Will need to be off for 48 hours prior to surgery.  Do not need to bridge with full dose IV Heparin since he has no history of CVA/TIA.  Agree with changing to IV Lopressor while NPO. 4.  HTN - controlled.  Follow BP closely since holding normal meds.  Now on IV metoprolol.   5.  Dyslipidemia   Sueanne Margarita, MD  01/22/2015  5:10 PM

## 2015-01-22 NOTE — Progress Notes (Signed)
ANTICOAGULATION CONSULT NOTE - Initial Consult  Pharmacy Consult for Heparin  Indication: atrial fibrillation  Allergies  Allergen Reactions  . Claritin [Loratadine] Other (See Comments)    Urinary retention  . Tradjenta [Linagliptin] Other (See Comments)    High blood sugar    Patient Measurements: Height: 5\' 10"  (177.8 cm) Weight: 202 lb 11.2 oz (91.944 kg) IBW/kg (Calculated) : 73  Vital Signs: Temp: 98.2 F (36.8 C) (08/28 1709) Temp Source: Oral (08/28 1709) BP: 177/65 mmHg (08/28 1709) Pulse Rate: 76 (08/28 1709)  Labs:  Recent Labs  01/22/15 1155  HGB 12.4*  HCT 37.2*  PLT 125*  CREATININE 1.75*    Estimated Creatinine Clearance: 40.9 mL/min (by C-G formula based on Cr of 1.75).   Medical History: Past Medical History  Diagnosis Date  . Hyperlipidemia   . Hypertension   . Diabetes mellitus   . GERD (gastroesophageal reflux disease)   . NICM (nonischemic cardiomyopathy)     a. EF 35-40% in 2009, 55% in 2010. b. 09/2013: 35-40% by echo and 48% by nuc.  . Coronary artery disease     a. Nonobstructive by cath 2004. b. Nuc 09/2013 - no ischemia, fixed defect suggestive of possible prior infarction, felt low risk, EF 48%.   . Premature ventricular contractions   . Atrial fibrillation     a. 09/2013: RVR then bradycardia then spont conv to NSR.  . CKD (chronic kidney disease), stage III   . 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 acetaminophen (TYLENOL) 500 MG tablet Take 500-1,000 mg by mouth daily as needed for mild pain.    01/21/2015  . Artificial Tear Ointment (DRY EYES OP) Place 1 drop into both eyes daily as needed (for dry eyes).   Past Month at Unknown time  . atorvastatin (LIPITOR) 40 MG tablet Take 40 mg by mouth daily at 6 PM.   3 01/21/2015 at Unknown time  . clonazePAM (KLONOPIN) 0.5 MG tablet Take 0.5 mg by mouth daily.  0 01/22/2015 at Unknown time   . Coenzyme Q10 (COQ10) 200 MG CAPS Take 100 mg by mouth daily.    01/22/2015 at Unknown time  . enalapril (VASOTEC) 5 MG tablet Take 1 tablet (5 mg total) by mouth daily. 90 tablet 1 01/22/2015 at Unknown time  . hydrALAZINE (APRESOLINE) 10 MG tablet TAKE 1 TABLET BY MOUTH THREE TIMES DAILY 90 tablet 5 01/22/2015 at Unknown time  . metoprolol (LOPRESSOR) 50 MG tablet Take 1.5 tablets (75 mg total) by mouth 2 (two) times daily. 90 tablet 3 01/22/2015 at 0900  . omeprazole (PRILOSEC) 10 MG capsule Take 20 mg by mouth daily.    01/22/2015 at Unknown time  . Rivaroxaban (XARELTO) 15 MG TABS tablet Take 1 tablet (15 mg total) by mouth daily with supper. 30 tablet 3 01/21/2015 at Unknown time  . tamsulosin (FLOMAX) 0.4 MG CAPS capsule Take 0.4 mg by mouth at bedtime.   01/21/2015 at Unknown time  . TOUJEO SOLOSTAR 300 UNIT/ML SOPN INJECT 38 UNITS INTO THE SKIN UPTITRATING QOD BY 2 UNITS UTD (started at 20 units)  3 01/22/2015 at Unknown time    Assessment: 63 YOM with history of Afib on Xarelto. He presented to the ED with abdominal and back pian. Pharmacy consulted to start Heparin. Spoke with patient who says his last dose of Xarelto was around 1800 on 8/27. BL HL and aPTT  ordered. H/H slightly low. Plt 125K   Goal of Therapy:  Heparin level 0.3-0.7 units/ml aPTT 65-102 seconds Monitor platelets by anticoagulation protocol: Yes   Plan:  -Give heparin IV at 1350 units/hr with NO bolus since unsure if Xarelto has completely cleared -F/u 8 hour HL -Monitor daily HL, CBC and s/s of bleeding   Albertina Parr, PharmD., BCPS Clinical Pharmacist Pager [phone]

## 2015-01-22 NOTE — Consult Note (Signed)
Reason for Consult:gallstone pancreatitis Referring Physician: Ezzie Dural, MD  Erik [name] is an 74 y.o. male.  HPI:  Pt is a 74 yo M who presented with abdominal and back pain that started this AM.  He has had similar back spasm like pain before.  Previous episodes were like a band in the upper abdomen/back.  This improved with valium.  The pain today is primarily in the upper abdomen, and it was not relieved by valium or analgesics.  He recalls having barbecue yesterday.  Did not have any nausea or vomiting prior to EDP exam, but has since vomited in the ED.  He denies jaundice, dark urine or acholic stools.  Of note, he is a diabetic with atrial fibrillation, on xarelto.  His mother had gallbladder disease.    Past Medical History  Diagnosis Date  . Hyperlipidemia   . Hypertension   . Diabetes mellitus   . GERD (gastroesophageal reflux disease)   . NICM (nonischemic cardiomyopathy)     a. EF 35-40% in 2009, 55% in 2010. b. 09/2013: 35-40% by echo and 48% by nuc.  . Coronary artery disease     a. Nonobstructive by cath 2004. b. Nuc 09/2013 - no ischemia, fixed defect suggestive of possible prior infarction, felt low risk, EF 48%.   . Premature ventricular contractions   . Atrial fibrillation     a. 09/2013: RVR then bradycardia then spont conv to NSR.  . CKD (chronic kidney disease), stage III   . 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    Past Surgical History  Procedure Laterality Date  . Cardiac catheterization  10/25/00, 12/29/02    Family History  Problem Relation Age of Onset  . Heart disease Father   . Heart disease Mother   . Diabetes Mother   . Heart disease Brother   . Diabetes Brother   . Hypertension Mother   . Hypertension Brother   . Heart attack Neg Hx   . Stroke Neg Hx     Social History:  reports that he has quit smoking. His smoking use included Cigarettes. He does not have any smokeless tobacco history on file. His alcohol and drug  histories are not on file.  Allergies: No Known Allergies  Medications:  acetaminophen (TYLENOL) 500 MG tablet   atorvastatin (LIPITOR) 80 MG tablet   Coenzyme Q10 (COQ10) 200 MG CAPS   enalapril (VASOTEC) 5 MG tablet   glimepiride (AMARYL) 2 MG tablet   hydrALAZINE (APRESOLINE) 10 MG tablet   imipramine (TOFRANIL) 50 MG tablet   L-Arginine 500 MG TABS   MAGNESIUM PO   metoprolol (LOPRESSOR) 50 MG tablet   omeprazole (PRILOSEC) 10 MG capsule   Rivaroxaban (XARELTO) 15 MG TABS tablet   Selenium 200 MCG CAPS   tamsulosin (FLOMAX) 0.4 MG CAPS capsule         Results for orders placed or performed during the hospital encounter of 01/22/15 (from the past 48 hour(s))  Urinalysis, Routine w reflex microscopic (not at [facility])     Status: Abnormal   Collection Time: 01/22/15 11:26 AM  Result Value Ref Range   Color, Urine YELLOW YELLOW   APPearance CLEAR CLEAR   Specific Gravity, Urine 1.013 1.005 - 1.030   pH 5.0 5.0 - 8.0   Glucose, UA >1000 (A) NEGATIVE mg/dL   Hgb urine dipstick NEGATIVE NEGATIVE   Bilirubin Urine NEGATIVE NEGATIVE   Ketones, ur NEGATIVE NEGATIVE mg/dL   Protein, ur NEGATIVE  NEGATIVE mg/dL   Urobilinogen, UA 0.2 0.0 - 1.0 mg/dL   Nitrite NEGATIVE NEGATIVE   Leukocytes, UA NEGATIVE NEGATIVE  Urine microscopic-add on     Status: None   Collection Time: 01/22/15 11:26 AM  Result Value Ref Range   WBC, UA  <3 WBC/hpf    NO FORMED ELEMENTS SEEN ON URINE MICROSCOPIC EXAMINATION  Lipase, blood     Status: Abnormal   Collection Time: 01/22/15 11:55 AM  Result Value Ref Range   Lipase 1940 (H) 22 - 51 U/L    Comment: RESULTS CONFIRMED BY MANUAL DILUTION  Comprehensive metabolic panel     Status: Abnormal   Collection Time: 01/22/15 11:55 AM  Result Value Ref Range   Sodium 139 135 - 145 mmol/L   Potassium 4.3 3.5 - 5.1 mmol/L   Chloride 107 101 - 111 mmol/L   CO2 26 22 - 32 mmol/L   Glucose, Bld 287 (H) 65 - 99 mg/dL   BUN 15 6 - 20 mg/dL    Creatinine, Ser 1.75 (H) 0.61 - 1.24 mg/dL   Calcium 8.7 (L) 8.9 - 10.3 mg/dL   Total Protein 6.0 (L) 6.5 - 8.1 g/dL   Albumin 3.5 3.5 - 5.0 g/dL   AST 47 (H) 15 - 41 U/L   ALT 26 17 - 63 U/L   Alkaline Phosphatase 73 38 - 126 U/L   Total Bilirubin 1.2 0.3 - 1.2 mg/dL   GFR calc non Af Amer 36 (L) >60 mL/min   GFR calc Af Amer 42 (L) >60 mL/min    Comment: (NOTE) The eGFR has been calculated using the CKD EPI equation. This calculation has not been validated in all clinical situations. eGFR's persistently <60 mL/min signify possible Chronic Kidney Disease.    Anion gap 6 5 - 15  CBC     Status: Abnormal   Collection Time: 01/22/15 11:55 AM  Result Value Ref Range   WBC 8.1 4.0 - 10.5 K/uL   RBC 4.37 4.22 - 5.81 MIL/uL   Hemoglobin 12.4 (L) 13.0 - 17.0 g/dL   HCT 37.2 (L) 39.0 - 52.0 %   MCV 85.1 78.0 - 100.0 fL   MCH 28.4 26.0 - 34.0 pg   MCHC 33.3 30.0 - 36.0 g/dL   RDW 13.2 11.5 - 15.5 %   Platelets 125 (L) 150 - 400 K/uL  CBG monitoring, ED     Status: Abnormal   Collection Time: 01/22/15 12:25 PM  Result Value Ref Range   Glucose-Capillary 242 (H) 65 - 99 mg/dL    Ct Abdomen Pelvis W Contrast  01/22/2015   CLINICAL DATA:  Epigastric pain and right lower quadrant pain radiating to back.  EXAM: CT ABDOMEN AND PELVIS WITH CONTRAST  TECHNIQUE: Multidetector CT imaging of the abdomen and pelvis was performed using the standard protocol following bolus administration of intravenous contrast.  CONTRAST:  60m OMNIPAQUE IOHEXOL 300 MG/ML  SOLN  COMPARISON:  None.  FINDINGS: Lung bases are within normal. There is calcified plaque over the left anterior descending coronary artery.  Abdominal images demonstrate mild cholelithiasis. The liver, spleen and adrenal glands are normal. Appendix is normal. Small bowel and colon are within normal.  There is moderate peripancreatic fluid as well as fluid over the right pericolic gutter. Findings are compatible with acute pancreatitis. No  significant complicating features.  A horseshoe kidney is present. There is no hydronephrosis or nephrolithiasis 3.1 cm simple renal cyst over the right-sided component of the horseshoe kidney. Ureters are  normal.  Mild calcified plaque over the abdominal aorta and iliac arteries.  Pelvic images demonstrate mild prominence of the prostate gland as the bladder and rectum are otherwise within normal.  There are moderate degenerative changes of the spine with prominent anterior osteophytes. Mild degenerate change of the hips.  IMPRESSION: Evidence of acute pancreatitis likely related to patient's gallbladder stone disease. No complicating features.  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 3.1 cm cyst over the right-sided component of the horseshoe kidney.  Mild prostatic enlargement.  Atherosclerotic coronary artery disease.   Electronically Signed   By: Marin Olp M.D.   On: 01/22/2015 14:43    Review of Systems  Constitutional: Negative.   HENT: Negative.   Eyes: Negative.   Respiratory: Negative.   Cardiovascular: Negative.   Gastrointestinal: Positive for nausea, vomiting, abdominal pain and diarrhea.  Genitourinary: Negative.   Musculoskeletal: Positive for back pain.  Skin: Negative.   Neurological: Negative.   Endo/Heme/Allergies: Negative.   Psychiatric/Behavioral: Negative.    Blood pressure 154/65, pulse 70, temperature 97.9 F (36.6 C), temperature source Oral, resp. rate 17, height 5' 10"  (1.778 m), weight 91.944 kg (202 lb 11.2 oz), SpO2 97 %. Physical Exam  Constitutional: He is oriented to person, place, and time. He appears well-developed and well-nourished. He appears distressed (looks uncomfortable).  HENT:  Head: Normocephalic and atraumatic.  Eyes: Conjunctivae are normal. Pupils are equal, round, and reactive to light.  Neck: Normal range of motion. Neck supple. No tracheal deviation present. No thyromegaly present.  Cardiovascular: Normal rate,  regular rhythm and intact distal pulses.   Respiratory: Effort normal. No respiratory distress.  GI: Soft. He exhibits distension (mild). There is tenderness (epigastric anc upper abdomen). There is guarding (voluntary).  Musculoskeletal: Normal range of motion. He exhibits no edema or tenderness.  Lymphadenopathy:    He has no cervical adenopathy.  Neurological: He is alert and oriented to person, place, and time.  Skin: Skin is warm and dry. No rash noted. He is not diaphoretic. No erythema. No pallor.  Psychiatric: He has a normal mood and affect. His behavior is normal. Judgment and thought content normal.    Assessment/Plan: Gallstone pancreatitis  Anticoagulation Cardiomyopathy  Would treat pancreatitis first with supportive care, NPO, fluids, electrolytes. Hold xarelto. Would anticipated lap chole this admission after pancreatitis settles down.   Would appreciate cardiology comment if anything has changed since last year regarding operative risk.     Masaji Billups 01/22/2015, 3:38 PM

## 2015-01-23 ENCOUNTER — Inpatient Hospital Stay (HOSPITAL_COMMUNITY): Payer: Medicare Other

## 2015-01-23 ENCOUNTER — Ambulatory Visit (HOSPITAL_COMMUNITY): Payer: Medicare Other

## 2015-01-23 DIAGNOSIS — I429 Cardiomyopathy, unspecified: Secondary | ICD-10-CM

## 2015-01-23 DIAGNOSIS — I10 Essential (primary) hypertension: Secondary | ICD-10-CM

## 2015-01-23 LAB — CBC
HEMATOCRIT: 38.3 % — AB (ref 39.0–52.0)
HEMOGLOBIN: 12.6 g/dL — AB (ref 13.0–17.0)
MCH: 27.9 pg (ref 26.0–34.0)
MCHC: 32.9 g/dL (ref 30.0–36.0)
MCV: 84.9 fL (ref 78.0–100.0)
Platelets: 131 10*3/uL — ABNORMAL LOW (ref 150–400)
RBC: 4.51 MIL/uL (ref 4.22–5.81)
RDW: 13.4 % (ref 11.5–15.5)
WBC: 8.9 10*3/uL (ref 4.0–10.5)

## 2015-01-23 LAB — COMPREHENSIVE METABOLIC PANEL
ALK PHOS: 70 U/L (ref 38–126)
ALT: 43 U/L (ref 17–63)
AST: 50 U/L — AB (ref 15–41)
Albumin: 3.4 g/dL — ABNORMAL LOW (ref 3.5–5.0)
Anion gap: 6 (ref 5–15)
BUN: 16 mg/dL (ref 6–20)
CALCIUM: 8.2 mg/dL — AB (ref 8.9–10.3)
CO2: 28 mmol/L (ref 22–32)
CREATININE: 1.59 mg/dL — AB (ref 0.61–1.24)
Chloride: 106 mmol/L (ref 101–111)
GFR calc non Af Amer: 41 mL/min — ABNORMAL LOW (ref >60)
GFR, EST AFRICAN AMERICAN: 47 mL/min — AB (ref >60)
GLUCOSE: 162 mg/dL — AB (ref 65–99)
Potassium: 4.8 mmol/L (ref 3.5–5.1)
SODIUM: 140 mmol/L (ref 135–145)
Total Bilirubin: 1.1 mg/dL (ref 0.3–1.2)
Total Protein: 5.9 g/dL — ABNORMAL LOW (ref 6.5–8.1)

## 2015-01-23 LAB — AMYLASE: AMYLASE: 620 U/L — AB (ref 28–100)

## 2015-01-23 LAB — HEPARIN LEVEL (UNFRACTIONATED): HEPARIN UNFRACTIONATED: 1.66 [IU]/mL — AB (ref 0.30–0.70)

## 2015-01-23 LAB — GLUCOSE, CAPILLARY
GLUCOSE-CAPILLARY: 109 mg/dL — AB (ref 65–99)
GLUCOSE-CAPILLARY: 110 mg/dL — AB (ref 65–99)
GLUCOSE-CAPILLARY: 121 mg/dL — AB (ref 65–99)
GLUCOSE-CAPILLARY: 151 mg/dL — AB (ref 65–99)
GLUCOSE-CAPILLARY: 96 mg/dL (ref 65–99)
Glucose-Capillary: 112 mg/dL — ABNORMAL HIGH (ref 65–99)

## 2015-01-23 LAB — LIPASE, BLOOD: Lipase: 800 U/L — ABNORMAL HIGH (ref 22–51)

## 2015-01-23 LAB — APTT: aPTT: 182 seconds — ABNORMAL HIGH (ref 24–37)

## 2015-01-23 MED ORDER — HEPARIN (PORCINE) IN NACL 100-0.45 UNIT/ML-% IJ SOLN: 1000.0000 [IU]/h | INTRAMUSCULAR | Status: DC

## 2015-01-23 MED ORDER — METOPROLOL TARTRATE 1 MG/ML IV SOLN
5.0000 mg | Freq: Four times a day (QID) | INTRAVENOUS | Status: DC
  Administered 2015-01-24 – 2015-01-26 (×8): 5 mg via INTRAVENOUS
  Filled 2015-01-23 (×9): qty 5

## 2015-01-23 MED ORDER — METOPROLOL TARTRATE 1 MG/ML IV SOLN
2.5000 mg | Freq: Once | INTRAVENOUS | Status: AC
  Administered 2015-01-23: 2.5 mg via INTRAVENOUS
  Filled 2015-01-23: qty 5

## 2015-01-23 MED ORDER — SODIUM CHLORIDE 0.9 % IV SOLN
INTRAVENOUS | Status: AC
  Administered 2015-01-23: 11:00:00 via INTRAVENOUS

## 2015-01-23 MED ORDER — DILTIAZEM HCL 60 MG PO TABS
60.0000 mg | ORAL_TABLET | Freq: Four times a day (QID) | ORAL | Status: DC
  Administered 2015-01-23 – 2015-01-26 (×10): 60 mg via ORAL
  Filled 2015-01-23 (×12): qty 1

## 2015-01-23 MED ORDER — DILTIAZEM HCL 25 MG/5ML IV SOLN
10.0000 mg | Freq: Once | INTRAVENOUS | Status: AC
  Administered 2015-01-23: 10 mg via INTRAVENOUS
  Filled 2015-01-23: qty 5

## 2015-01-23 NOTE — Progress Notes (Signed)
Patient Name: Erik [name] Date of Encounter: 01/23/2015   Principal Problem:   Acute pancreatitis Active Problems:   Diabetes mellitus   CKD (chronic kidney disease), stage III   NICM (nonischemic cardiomyopathy)   Hypertension   Atrial fibrillation   GERD (gastroesophageal reflux disease)    SUBJECTIVE  Continues to have mild diffuse abdominal and lower-mid back pain.  No c/p or sob.  CURRENT MEDS . insulin aspart  0-9 Units Subcutaneous 6 times per day  . insulin glargine  15 Units Subcutaneous QHS  . metoprolol  2.5 mg Intravenous 4 times per day  . pantoprazole (PROTONIX) IV  40 mg Intravenous Q24H  . sodium chloride  3 mL Intravenous Q12H    OBJECTIVE  Filed Vitals:   01/22/15 1630 01/22/15 1709 01/22/15 2020 01/23/15 0409  BP: 135/69 177/65 139/67 138/64  Pulse: 97 76 65 83  Temp:  98.2 F (36.8 C) 98.6 F (37 C) 98.7 F (37.1 C)  TempSrc:  Oral Oral Oral  Resp: 13 16 17 16   Height:      Weight:   200 lb 3.2 oz (90.81 kg)   SpO2: 96% 97% 96% 96%    Intake/Output Summary (Last 24 hours) at 01/23/15 0954 Last data filed at 01/23/15 0601  Gross per 24 hour  Intake 1231.5 ml  Output    450 ml  Net  781.5 ml   Filed Weights   01/22/15 1118 01/22/15 2020  Weight: 202 lb 11.2 oz (91.944 kg) 200 lb 3.2 oz (90.81 kg)    PHYSICAL EXAM  General: Pleasant, NAD. Neuro: Alert and oriented X 3. Moves all extremities spontaneously. Psych: Normal affect. HEENT:  Normal  Neck: Supple without bruits or JVD. Lungs:  Resp regular and unlabored,, intermittent exp wheezing throughout. Heart: RRR no s3, s4, or murmurs. Abdomen: Soft, diffusely tender to light palpation, non-distended, BS + x 4.  Extremities: No clubbing, cyanosis or edema. DP/PT/Radials 2+ and equal bilaterally.  Accessory Clinical Findings  CBC  Recent Labs  01/22/15 1155 01/23/15 0259  WBC 8.1 8.9  HGB 12.4* 12.6*  HCT 37.2* 38.3*  MCV 85.1 84.9  PLT 125* [id number]*   Basic Metabolic  Panel  Recent Labs  01/22/15 1155 01/23/15 0259  NA 139 140  K 4.3 4.8  CL 107 106  CO2 26 28  GLUCOSE 287* 162*  BUN 15 16  CREATININE 1.75* 1.59*  CALCIUM 8.7* 8.2*   Liver Function Tests  Recent Labs  01/22/15 1155 01/23/15 0259  AST 47* 50*  ALT 26 43  ALKPHOS 73 70  BILITOT 1.2 1.1  PROT 6.0* 5.9*  ALBUMIN 3.5 3.4*    Recent Labs  01/22/15 1155 01/23/15 0259  LIPASE 1940* 800*  AMYLASE  --  620*   TELE  rsr  Radiology/Studies  Ct Abdomen Pelvis W Contrast  01/22/2015   CLINICAL DATA:  Epigastric pain and right lower quadrant pain radiating to back.  EXAM: CT ABDOMEN AND PELVIS WITH CONTRAST  TECHNIQUE: Multidetector CT imaging of the abdomen and pelvis was performed using the standard protocol following bolus administration of intravenous contrast.  CONTRAST:  90mL OMNIPAQUE IOHEXOL 300 MG/ML  SOLN  COMPARISON:  None.  FINDINGS: Lung bases are within normal. There is calcified plaque over the left anterior descending coronary artery.  Abdominal images demonstrate mild cholelithiasis. The liver, spleen and adrenal glands are normal. Appendix is normal. Small bowel and colon are within normal.  There is moderate peripancreatic fluid as well as fluid  over the right pericolic gutter. Findings are compatible with acute pancreatitis. No significant complicating features.  A horseshoe kidney is present. There is no hydronephrosis or nephrolithiasis 3.1 cm simple renal cyst over the right-sided component of the horseshoe kidney. Ureters are normal.  Mild calcified plaque over the abdominal aorta and iliac arteries.  Pelvic images demonstrate mild prominence of the prostate gland as the bladder and rectum are otherwise within normal.  There are moderate degenerative changes of the spine with prominent anterior osteophytes. Mild degenerate change of the hips.  IMPRESSION: Evidence of acute pancreatitis likely related to patient's gallbladder stone disease. No complicating  features.  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 3.1 cm cyst over the right-sided component of the horseshoe kidney.  Mild prostatic enlargement.  Atherosclerotic coronary artery disease.   Electronically Signed   By: Erik [name] M.D.   On: 01/22/2015 14:43    ASSESSMENT AND PLAN  1.  Acute gallstone pancreatitis.  Seen by general surgery with plan for lap chole this admission, once pancreatitis stabilizes.  Awaiting repeat 2D echo to determine cardiac risk. If EF remains nl, low risk for surgery.  Would plan to continue bb therapy throughout perioperative period.  2.  H/O NICM:  Prev EF of 35-40% in 2009 with nonobs dzs on cath in 2004 and non-ischemic MV in 09/2013.  Most recent echo in 11/2013 showed EF of 50-55%. Repeat echo ordered for today.  On bb/acei/hydralazine @ home - IV bb here in setting of NPO related to #1.  HR/BP currently stable.  Volume stable.  3.  PAF:  Maintaining sinus rhythm on bb therapy.  CHA2DS2VASc = 5.  Xarelto on hold.  Currently on heparin however as prev stated, does not require heparin bridge in absence of prior CVA.  Will d/c heparin.  4.  Essential HTN:  BP stable this AM.  On bb, acei, hydralazine @ home.  Only on lopressor 2.5 IV q 6h here in setting of NPO.  Follow.  5.  HL:  Lipitor on hold NPO.  6.  CKD III:  Creat improved with hydration.  Signed, Erik Hodgkins NP   History and all data above reviewed.  Patient examined.  I agree with the findings as above.  He denies any SOB.  No chest pain.  The patient exam reveals COR:RRR  ,  Lungs: clear  ,  Abd: Positive bowel sounds, no rebound no guarding, Ext No edema  .  All available labs, radiology testing, previous records reviewed. Agree with documented assessment and plan. Preop:  Echo results pending.  No change in therapy.  He will be at acceptable risk for surgery but I would like to see the current EF.  Atrial fib:  NSR, off anticoagulation.    Erik [name]  12:30 PM   01/23/2015

## 2015-01-23 NOTE — Progress Notes (Signed)
ANTICOAGULATION CONSULT NOTE - Follow-up Consult  Pharmacy Consult for Heparin  Indication: atrial fibrillation  Allergies  Allergen Reactions  . Claritin [Loratadine] Other (See Comments)    Urinary retention  . Tradjenta [Linagliptin] Other (See Comments)    High blood sugar    Patient Measurements: Height: 5\' 10"  (177.8 cm) Weight: 200 lb 3.2 oz (90.81 kg) IBW/kg (Calculated) : 73  Vital Signs: Temp: 98.7 F (37.1 C) (08/29 0409) Temp Source: Oral (08/29 0409) BP: 138/64 mmHg (08/29 0409) Pulse Rate: 83 (08/29 0409)  Labs:  Recent Labs  01/22/15 1155 01/22/15 1813 01/23/15 0257 01/23/15 0259  HGB 12.4*  --   --  12.6*  HCT 37.2*  --   --  38.3*  PLT 125*  --   --  131*  APTT  --  30 182*  --   HEPARINUNFRC  --  2.01*  --   --   CREATININE 1.75*  --   --  1.59*    Estimated Creatinine Clearance: 44.8 mL/min (by C-G formula based on Cr of 1.59).   Assessment: 40 YOM with history of Afib on Xarelto PTA. Heparin bridge while Xarelto on hold for lap chole. Noted that per cards note 8/28 - likely doesn't need heparin bridge for short peri-op period. Utilizing aPTT to monitor heparin as Xarelto still affecting heparin/anti-Xa level. APTT elevated to 182 - confirmed that level was drawn from opposite arm where heparin was infusing. No bleeding noted. CBC stable.  Goal of Therapy:  Heparin level 0.3-0.7 units/ml aPTT 65-102 seconds Monitor platelets by anticoagulation protocol: Yes   Plan:  -Hold heparin x 1 hour -Restart heparin at 1000 units/hr -F/u 8 hour aPTT -Consider d/c heparin bridge as likely not needed in short-term peri-op period while Xarelto on hold  Sherlon Handing, PharmD, BCPS Clinical pharmacist, pager [phone] 01/23/2015 5:01 AM

## 2015-01-23 NOTE — Progress Notes (Signed)
Subjective: Still having a bit of pain.  Not liking being with PO's.    Objective: Vital signs in last 24 hours: Temp:  [97.9 F (36.6 C)-98.7 F (37.1 C)] 98.7 F (37.1 C) (08/29 0409) Pulse Rate:  [57-97] 83 (08/29 0409) Resp:  [13-26] 16 (08/29 0409) BP: (126-177)/(46-72) 138/64 mmHg (08/29 0409) SpO2:  [94 %-99 %] 96 % (08/29 0409) Weight:  [90.81 kg (200 lb 3.2 oz)-91.944 kg (202 lb 11.2 oz)] 90.81 kg (200 lb 3.2 oz) (08/28 2020) Last BM Date: 01/22/15 NPO No Bm recorded Afebrile, VSS Creatinine 1.59 Lipase down to 800 WBC is normal Intake/Output from previous day: 08/28 0701 - 08/29 0700 In: 1231.5 [I.V.:1231.5] Out: 450 [Urine:450] Intake/Output this shift:    General appearance: alert, cooperative and no distress GI: soft, still tender midline, taking IV pain meds.   Lab Results:   Recent Labs  01/22/15 1155 01/23/15 0259  WBC 8.1 8.9  HGB 12.4* 12.6*  HCT 37.2* 38.3*  PLT 125* 131*    BMET  Recent Labs  01/22/15 1155 01/23/15 0259  NA 139 140  K 4.3 4.8  CL 107 106  CO2 26 28  GLUCOSE 287* 162*  BUN 15 16  CREATININE 1.75* 1.59*  CALCIUM 8.7* 8.2*   PT/INR No results for input(s): LABPROT, INR in the last 72 hours.   Recent Labs Lab 01/22/15 1155 01/23/15 0259  AST 47* 50*  ALT 26 43  ALKPHOS 73 70  BILITOT 1.2 1.1  PROT 6.0* 5.9*  ALBUMIN 3.5 3.4*     Lipase     Component Value Date/Time   LIPASE 800* 01/23/2015 0259     Studies/Results: Ct Abdomen Pelvis W Contrast  01/22/2015   CLINICAL DATA:  Epigastric pain and right lower quadrant pain radiating to back.  EXAM: CT ABDOMEN AND PELVIS WITH CONTRAST  TECHNIQUE: Multidetector CT imaging of the abdomen and pelvis was performed using the standard protocol following bolus administration of intravenous contrast.  CONTRAST:  60mL OMNIPAQUE IOHEXOL 300 MG/ML  SOLN  COMPARISON:  None.  FINDINGS: Lung bases are within normal. There is calcified plaque over the left anterior  descending coronary artery.  Abdominal images demonstrate mild cholelithiasis. The liver, spleen and adrenal glands are normal. Appendix is normal. Small bowel and colon are within normal.  There is moderate peripancreatic fluid as well as fluid over the right pericolic gutter. Findings are compatible with acute pancreatitis. No significant complicating features.  A horseshoe kidney is present. There is no hydronephrosis or nephrolithiasis 3.1 cm simple renal cyst over the right-sided component of the horseshoe kidney. Ureters are normal.  Mild calcified plaque over the abdominal aorta and iliac arteries.  Pelvic images demonstrate mild prominence of the prostate gland as the bladder and rectum are otherwise within normal.  There are moderate degenerative changes of the spine with prominent anterior osteophytes. Mild degenerate change of the hips.  IMPRESSION: Evidence of acute pancreatitis likely related to patient's gallbladder stone disease. No complicating features.  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 3.1 cm cyst over the right-sided component of the horseshoe kidney.  Mild prostatic enlargement.  Atherosclerotic coronary artery disease.   Electronically Signed   By: Marin Olp M.D.   On: 01/22/2015 14:43    Medications: . insulin aspart  0-9 Units Subcutaneous 6 times per day  . insulin glargine  15 Units Subcutaneous QHS  . metoprolol  2.5 mg Intravenous 4 times per day  . pantoprazole (PROTONIX) IV  40 mg Intravenous  Q24H  . sodium chloride  3 mL Intravenous Q12H    Assessment/Plan Gallstone pancreatitis Chronic anticoagulation on Xarelto (last dose 01/21/15 around 1800 hours) Atrial fibrillation Hx of CM/CHF EF 35-40% Hypertension AODM Chronic renal disease/horseshoe kidney/3.1 cm renal cyst Antibiotics:  None DVT: xarelto last dose 8/27/SCD  Plan:  Continue NPO, wait for pancreatitis to resolve.  We will continue to follow     LOS: 1 day     Erik [name] 01/23/2015

## 2015-01-23 NOTE — Progress Notes (Signed)
  Echocardiogram 2D Echocardiogram has been performed.  Darlina Sicilian M 01/23/2015, 12:12 PM

## 2015-01-23 NOTE — Progress Notes (Signed)
[facility] PROGRESS NOTE  Erik [name] [id number] DOB: [birth year] DOA: 01/22/2015  PCP: [name], MD  Brief HPI: 74 year old Caucasian male with a past medical history of coronary artery disease, diabetes type 2, atrial fibrillation on anticoagulation, chronic kidney disease, presented with abdominal pain and back pain. Was noted to have acute pancreatitis. Thought to be secondary to gallstones.  Past medical history:  Past Medical History  Diagnosis Date  . Hyperlipidemia   . Hypertension   . Diabetes mellitus   . GERD (gastroesophageal reflux disease)   . NICM (nonischemic cardiomyopathy)     a. EF 35-40% in 2009, 55% in 2010. b. 09/2013: 35-40% by echo and 48% by nuc.  . Coronary artery disease     a. Nonobstructive by cath 2004. b. Nuc 09/2013 - no ischemia, fixed defect suggestive of possible prior infarction, felt low risk, EF 48%.   . Premature ventricular contractions   . Atrial fibrillation     a. 09/2013: RVR then bradycardia then spont conv to NSR.  . CKD (chronic kidney disease), stage III   . 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 surgery. Cardiology.  Procedures:  2-D echocardiogram Study Conclusions - Left ventricle: The cavity size was normal. Systolic function wasnormal. The estimated ejection fraction was in the range of 50%to 55%. There is hypokinesis of the basal-midinferior myocardium.Doppler parameters are consistent with abnormal left ventricularrelaxation (grade 1 diastolic dysfunction). - Ventricular septum: Septal motion showed paradox. - Mitral valve: Mildly calcified annulus. There was mildregurgitation. - Left atrium: The atrium was mildly dilated. Impressions:- Compared to the prior study, there has been no significantinterval change.  Antibiotics: None  Subjective: Patient feels better. Pain is not as severe as yesterday. Denies any nausea, vomiting today. Denies chest pain or shortness of  breath.  Objective: Vital Signs  Filed Vitals:   01/22/15 1630 01/22/15 1709 01/22/15 2020 01/23/15 0409  BP: 135/69 177/65 139/67 138/64  Pulse: 97 76 65 83  Temp:  98.2 F (36.8 C) 98.6 F (37 C) 98.7 F (37.1 C)  TempSrc:  Oral Oral Oral  Resp: 13 16 17 16   Height:      Weight:   90.81 kg (200 lb 3.2 oz)   SpO2: 96% 97% 96% 96%    Intake/Output Summary (Last 24 hours) at 01/23/15 1036 Last data filed at 01/23/15 0601  Gross per 24 hour  Intake 1231.5 ml  Output    450 ml  Net  781.5 ml   Filed Weights   01/22/15 1118 01/22/15 2020  Weight: 91.944 kg (202 lb 11.2 oz) 90.81 kg (200 lb 3.2 oz)    General appearance: alert, cooperative, appears stated age and no distress Resp: Diminished air entry at the bases, right more than left with a few crackles. Cardio: regular rate and rhythm, S1, S2 normal, no murmur, click, rub or gallop GI: Mildly tender in the epigastrium. Sluggish bowel sounds but present. No rebound, rigidity or guarding. No masses or organomegaly. Extremities: extremities normal, atraumatic, no cyanosis or edema Neurologic: No focal deficits  Lab Results:  Basic Metabolic Panel:  Recent Labs Lab 01/22/15 1155 01/23/15 0259  NA 139 140  K 4.3 4.8  CL 107 106  CO2 26 28  GLUCOSE 287* 162*  BUN 15 16  CREATININE 1.75* 1.59*  CALCIUM 8.7* 8.2*   Liver Function Tests:  Recent Labs Lab 01/22/15 1155 01/23/15 0259  AST 47* 50*  ALT 26 43  ALKPHOS 73  70  BILITOT 1.2 1.1  PROT 6.0* 5.9*  ALBUMIN 3.5 3.4*    Recent Labs Lab 01/22/15 1155 01/23/15 0259  LIPASE 1940* 800*  AMYLASE  --  620*   CBC:  Recent Labs Lab 01/22/15 1155 01/23/15 0259  WBC 8.1 8.9  HGB 12.4* 12.6*  HCT 37.2* 38.3*  MCV 85.1 84.9  PLT 125* 131*    CBG:  Recent Labs Lab 01/22/15 1706 01/22/15 2018 01/23/15 0034 01/23/15 0408 01/23/15 0743  GLUCAP 244* 220* 151* 121* 110*    No results found for this or any previous visit (from the past 240  hour(s)).    Studies/Results: Ct Abdomen Pelvis W Contrast  01/22/2015   CLINICAL DATA:  Epigastric pain and right lower quadrant pain radiating to back.  EXAM: CT ABDOMEN AND PELVIS WITH CONTRAST  TECHNIQUE: Multidetector CT imaging of the abdomen and pelvis was performed using the standard protocol following bolus administration of intravenous contrast.  CONTRAST:  51mL OMNIPAQUE IOHEXOL 300 MG/ML  SOLN  COMPARISON:  None.  FINDINGS: Lung bases are within normal. There is calcified plaque over the left anterior descending coronary artery.  Abdominal images demonstrate mild cholelithiasis. The liver, spleen and adrenal glands are normal. Appendix is normal. Small bowel and colon are within normal.  There is moderate peripancreatic fluid as well as fluid over the right pericolic gutter. Findings are compatible with acute pancreatitis. No significant complicating features.  A horseshoe kidney is present. There is no hydronephrosis or nephrolithiasis 3.1 cm simple renal cyst over the right-sided component of the horseshoe kidney. Ureters are normal.  Mild calcified plaque over the abdominal aorta and iliac arteries.  Pelvic images demonstrate mild prominence of the prostate gland as the bladder and rectum are otherwise within normal.  There are moderate degenerative changes of the spine with prominent anterior osteophytes. Mild degenerate change of the hips.  IMPRESSION: Evidence of acute pancreatitis likely related to patient's gallbladder stone disease. No complicating features.  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 3.1 cm cyst over the right-sided component of the horseshoe kidney.  Mild prostatic enlargement.  Atherosclerotic coronary artery disease.   Electronically Signed   By: Marin Olp M.D.   On: 01/22/2015 14:43    Medications:  Scheduled: . insulin aspart  0-9 Units Subcutaneous 6 times per day  . insulin glargine  15 Units Subcutaneous QHS  . metoprolol  2.5 mg  Intravenous 4 times per day  . pantoprazole (PROTONIX) IV  40 mg Intravenous Q24H  . sodium chloride  3 mL Intravenous Q12H   Continuous: . sodium chloride 75 mL/hr at 01/23/15 1043   [id number] **OR** acetaminophen, artificial tears, HYDROmorphone (DILAUDID) injection, iohexol, LORazepam, ondansetron **OR** ondansetron (ZOFRAN) IV  Assessment/Plan:  Principal Problem:   Acute pancreatitis Active Problems:   Diabetes mellitus   CKD (chronic kidney disease), stage III   Atrial fibrillation   NICM (nonischemic cardiomyopathy)   Hypertension   GERD (gastroesophageal reflux disease)    Acute biliary pancreatitis Patient is improving slowly. Lipase is better. General surgery is following. Plan is for cholecystectomy, prior to discharge. Patient was also recently started on Tradjenta which can also cause pancreatitis. Continue IV fluids at a lower rate.  Cholelithiasis As above. Will need cholecystectomy prior to discharge. Cardiology is following. Patient does have a history of coronary artery disease. Had a low risk stress test last year. Echocardiogram as above. He should be able to proceed to surgery without further testing. Obtain chest x-ray due to crackles heard  on examination.  History of diabetes mellitus type 2 Continue Lantus and sliding scale coverage. Monitor CBGs closely. Holding oral agents.  History of atrial fibrillation Rate is controlled with metoprolol. Holding his oral anticoagulation. No need for intravenous heparin per cardiology.  History of chronic kidney disease stage III Stable. Continue to monitor  History of essential hypertension Stable. Blood pressure is reasonably well controlled.   DVT Prophylaxis: Recently taken off of his oral anticoagulation. SCDs    Code Status: Full code   Family Communication: Discussed with the patient   Disposition Plan: Await improvement. We will need to undergo cholecystectomy prior to discharge.     LOS: 1  day   [facility] Pager [phone] 01/23/2015, 10:36 AM  If 7PM-7AM, please contact night-coverage at [email or web address], password [facility]

## 2015-01-23 NOTE — Progress Notes (Signed)
EKG completed. IV Cardizem 10mg  given to pt per MD order along with scheduled Metoprolol. Will continue to assess and monitor pt.    Gavin Potters

## 2015-01-23 NOTE — Progress Notes (Signed)
RN notified from telemetry that pt went in to A-Fib and HR increased in the 180s. MD notified. New orders obtained. EKG to be completed. Will continue to assess and monitor pt.   Carole Civil, RN

## 2015-01-24 DIAGNOSIS — K851 Biliary acute pancreatitis without necrosis or infection: Secondary | ICD-10-CM | POA: Insufficient documentation

## 2015-01-24 DIAGNOSIS — N179 Acute kidney failure, unspecified: Secondary | ICD-10-CM | POA: Insufficient documentation

## 2015-01-24 DIAGNOSIS — N189 Chronic kidney disease, unspecified: Secondary | ICD-10-CM

## 2015-01-24 DIAGNOSIS — I481 Persistent atrial fibrillation: Secondary | ICD-10-CM

## 2015-01-24 LAB — COMPREHENSIVE METABOLIC PANEL
ALK PHOS: 52 U/L (ref 38–126)
ALT: 26 U/L (ref 17–63)
ANION GAP: 7 (ref 5–15)
AST: 25 U/L (ref 15–41)
Albumin: 3 g/dL — ABNORMAL LOW (ref 3.5–5.0)
BUN: 25 mg/dL — ABNORMAL HIGH (ref 6–20)
CALCIUM: 7.2 mg/dL — AB (ref 8.9–10.3)
CO2: 23 mmol/L (ref 22–32)
Chloride: 105 mmol/L (ref 101–111)
Creatinine, Ser: 2.11 mg/dL — ABNORMAL HIGH (ref 0.61–1.24)
GFR, EST AFRICAN AMERICAN: 33 mL/min — AB (ref >60)
GFR, EST NON AFRICAN AMERICAN: 29 mL/min — AB (ref >60)
Glucose, Bld: 135 mg/dL — ABNORMAL HIGH (ref 65–99)
Potassium: 4.5 mmol/L (ref 3.5–5.1)
SODIUM: 135 mmol/L (ref 135–145)
TOTAL PROTEIN: 5.3 g/dL — AB (ref 6.5–8.1)
Total Bilirubin: 1.3 mg/dL — ABNORMAL HIGH (ref 0.3–1.2)

## 2015-01-24 LAB — GLUCOSE, CAPILLARY
GLUCOSE-CAPILLARY: 120 mg/dL — AB (ref 65–99)
GLUCOSE-CAPILLARY: 142 mg/dL — AB (ref 65–99)
Glucose-Capillary: 147 mg/dL — ABNORMAL HIGH (ref 65–99)
Glucose-Capillary: 130 mg/dL — ABNORMAL HIGH (ref 65–99)
Glucose-Capillary: 129 mg/dL — ABNORMAL HIGH (ref 65–99)
Glucose-Capillary: 144 mg/dL — ABNORMAL HIGH (ref 65–99)

## 2015-01-24 LAB — LIPASE, BLOOD: LIPASE: 86 U/L — AB (ref 22–51)

## 2015-01-24 MED ORDER — SODIUM CHLORIDE 0.9 % IV BOLUS (SEPSIS)
1000.0000 mL | Freq: Once | INTRAVENOUS | Status: AC
  Administered 2015-01-24: 1000 mL via INTRAVENOUS

## 2015-01-24 NOTE — Progress Notes (Signed)
Patient ID: Erik [name], male   DOB: 09-09-1940, 74 y.o.   MRN: [id number]    Subjective: Had an episode of rapid a fib last night.  On cardizem.  Still c/o of 7/10 epigastric pain  Objective: Vital signs in last 24 hours: Temp:  [98.2 F (36.8 C)-99.6 F (37.6 C)] 98.2 F (36.8 C) (08/30 0937) Pulse Rate:  [80-104] 80 (08/30 0937) Resp:  [17-18] 18 (08/30 0937) BP: (114-159)/(52-98) 114/88 mmHg (08/30 0937) SpO2:  [92 %-96 %] 96 % (08/30 0937) Weight:  [91.2 kg (201 lb 1 oz)] 91.2 kg (201 lb 1 oz) (08/29 2006) Last BM Date: 01/22/15  Intake/Output from previous day: 08/29 0701 - 08/30 0700 In: 0  Out: 675 [Urine:675] Intake/Output this shift:    PE: Abd: soft, tender only in epigastrium, few BS, seems a bit distended Heart: irregular Lungs: CTAB  Lab Results:   Recent Labs  01/22/15 1155 01/23/15 0259  WBC 8.1 8.9  HGB 12.4* 12.6*  HCT 37.2* 38.3*  PLT 125* 131*   BMET  Recent Labs  01/23/15 0259 01/24/15 0700  NA 140 135  K 4.8 4.5  CL 106 105  CO2 28 23  GLUCOSE 162* 135*  BUN 16 25*  CREATININE 1.59* 2.11*  CALCIUM 8.2* 7.2*   PT/INR No results for input(s): LABPROT, INR in the last 72 hours. CMP     Component Value Date/Time   NA 135 01/24/2015 0700   K 4.5 01/24/2015 0700   CL 105 01/24/2015 0700   CO2 23 01/24/2015 0700   GLUCOSE 135* 01/24/2015 0700   BUN 25* 01/24/2015 0700   CREATININE 2.11* 01/24/2015 0700   CREATININE 1.79* 10/14/2014 1644   CALCIUM 7.2* 01/24/2015 0700   PROT 5.3* 01/24/2015 0700   ALBUMIN 3.0* 01/24/2015 0700   AST 25 01/24/2015 0700   ALT 26 01/24/2015 0700   ALKPHOS 52 01/24/2015 0700   BILITOT 1.3* 01/24/2015 0700   GFRNONAA 29* 01/24/2015 0700   GFRAA 33* 01/24/2015 0700   Lipase     Component Value Date/Time   LIPASE 86* 01/24/2015 0700       Studies/Results: Dg Chest 2 View  01/23/2015   CLINICAL DATA:  Patient unable to stand  EXAM: CHEST  2 VIEW  COMPARISON:  09/16/2013  FINDINGS: Heart  size is normal. There is mild asymmetric elevation of the right hemidiaphragm. Subsegmental atelectasis noted in the left base. No airspace consolidation.  IMPRESSION: 1. Asymmetric elevation of right hemidiaphragm. 2. Left base atelectasis.   Electronically Signed   By: Kerby Moors M.D.   On: 01/23/2015 11:12   Ct Abdomen Pelvis W Contrast  01/22/2015   CLINICAL DATA:  Epigastric pain and right lower quadrant pain radiating to back.  EXAM: CT ABDOMEN AND PELVIS WITH CONTRAST  TECHNIQUE: Multidetector CT imaging of the abdomen and pelvis was performed using the standard protocol following bolus administration of intravenous contrast.  CONTRAST:  71mL OMNIPAQUE IOHEXOL 300 MG/ML  SOLN  COMPARISON:  None.  FINDINGS: Lung bases are within normal. There is calcified plaque over the left anterior descending coronary artery.  Abdominal images demonstrate mild cholelithiasis. The liver, spleen and adrenal glands are normal. Appendix is normal. Small bowel and colon are within normal.  There is moderate peripancreatic fluid as well as fluid over the right pericolic gutter. Findings are compatible with acute pancreatitis. No significant complicating features.  A horseshoe kidney is present. There is no hydronephrosis or nephrolithiasis 3.1 cm simple renal cyst over the  right-sided component of the horseshoe kidney. Ureters are normal.  Mild calcified plaque over the abdominal aorta and iliac arteries.  Pelvic images demonstrate mild prominence of the prostate gland as the bladder and rectum are otherwise within normal.  There are moderate degenerative changes of the spine with prominent anterior osteophytes. Mild degenerate change of the hips.  IMPRESSION: Evidence of acute pancreatitis likely related to patient's gallbladder stone disease. No complicating features.  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 3.1 cm cyst over the right-sided component of the horseshoe kidney.  Mild prostatic  enlargement.  Atherosclerotic coronary artery disease.   Electronically Signed   By: Marin Olp M.D.   On: 01/22/2015 14:43    Anti-infectives: Anti-infectives    None       Assessment/Plan  1. Gallstone pancreatitis -TB up to 1.3, lipase down to 86.  Patient still quite tender, ? CBD stone vs continued pancreatitis.  Creatinine increase to 2.11.  Will increase fluids some as this may help his pancreatitis and his kidneys.  NEw ECHO shows improvement in EF. -recheck cmet in am.  Possible OR tomorrow. 2. Other medical problems -defer to medicine.  D/w Dr. Curly Rim   LOS: 2 days    Joselyn Edling E 01/24/2015, 10:08 AM Pager: [phone]

## 2015-01-24 NOTE — Progress Notes (Signed)
SUBJECTIVE:  Still with some abdominal pain.  No chest pain or SOB.    PHYSICAL EXAM Filed Vitals:   01/23/15 1706 01/23/15 1752 01/23/15 2006 01/24/15 0438  BP: 159/81 140/72 145/98 114/52  Pulse: 100 104  93  Temp: 98.4 F (36.9 C)  99.6 F (37.6 C) 98.6 F (37 C)  TempSrc: Oral  Oral Oral  Resp: 18  17 17   Height:      Weight:   201 lb 1 oz (91.2 kg)   SpO2: 93% 92% 93% 93%   General:  No distress Lungs:  Bilateral left greater than right basilar crackles Heart:  Irregular Abdomen:  Mildly distended Extremities:  No edema   LABS:  Results for orders placed or performed during the hospital encounter of 01/22/15 (from the past 24 hour(s))  Glucose, capillary     Status: Abnormal   Collection Time: 01/23/15 12:12 PM  Result Value Ref Range   Glucose-Capillary 109 (H) 65 - 99 mg/dL  Glucose, capillary     Status: None   Collection Time: 01/23/15  5:06 PM  Result Value Ref Range   Glucose-Capillary 96 65 - 99 mg/dL  Glucose, capillary     Status: Abnormal   Collection Time: 01/23/15  8:05 PM  Result Value Ref Range   Glucose-Capillary 112 (H) 65 - 99 mg/dL  Glucose, capillary     Status: Abnormal   Collection Time: 01/24/15 12:13 AM  Result Value Ref Range   Glucose-Capillary 120 (H) 65 - 99 mg/dL  Glucose, capillary     Status: Abnormal   Collection Time: 01/24/15  4:36 AM  Result Value Ref Range   Glucose-Capillary 147 (H) 65 - 99 mg/dL  Comprehensive metabolic panel     Status: Abnormal   Collection Time: 01/24/15  7:00 AM  Result Value Ref Range   Sodium 135 135 - 145 mmol/L   Potassium 4.5 3.5 - 5.1 mmol/L   Chloride 105 101 - 111 mmol/L   CO2 23 22 - 32 mmol/L   Glucose, Bld 135 (H) 65 - 99 mg/dL   BUN 25 (H) 6 - 20 mg/dL   Creatinine, Ser 2.11 (H) 0.61 - 1.24 mg/dL   Calcium 7.2 (L) 8.9 - 10.3 mg/dL   Total Protein 5.3 (L) 6.5 - 8.1 g/dL   Albumin 3.0 (L) 3.5 - 5.0 g/dL   AST 25 15 - 41 U/L   ALT 26 17 - 63 U/L   Alkaline Phosphatase 52 38 -  126 U/L   Total Bilirubin 1.3 (H) 0.3 - 1.2 mg/dL   GFR calc non Af Amer 29 (L) >60 mL/min   GFR calc Af Amer 33 (L) >60 mL/min   Anion gap 7 5 - 15  Lipase, blood     Status: Abnormal   Collection Time: 01/24/15  7:00 AM  Result Value Ref Range   Lipase 86 (H) 22 - 51 U/L  Glucose, capillary     Status: Abnormal   Collection Time: 01/24/15  7:23 AM  Result Value Ref Range   Glucose-Capillary 130 (H) 65 - 99 mg/dL    Intake/Output Summary (Last 24 hours) at 01/24/15 0903 Last data filed at 01/24/15 [id number]  Gross per 24 hour  Intake      0 ml  Output    675 ml  Net   -675 ml   ECHO    - Left ventricle: The cavity size was normal. Systolic function was normal. The estimated ejection fraction was in  the range of 50% to 55%. There is hypokinesis of the basal-midinferior myocardium. Doppler parameters are consistent with abnormal left ventricular relaxation (grade 1 diastolic dysfunction). - Ventricular septum: Septal motion showed paradox. - Mitral valve: Mildly calcified annulus. There was mild regurgitation. - Left atrium: The atrium was mildly dilated.   ASSESSMENT AND PLAN:  CARDIOMYOPATHY:   EF is well preserved.  Continue current therapy.  HTN:  BP OK.  Continue current therapy.   AF:   Off Xarelto.  No bridging necessary.  Rate OK.  Tele reviewed today.   OK to discontinue tele.   CKD:  Creat is increased.  Per primary team.  PREOP:  Patient is at acceptable risk for the planned procedure.    Please call with further questions.     Jeneen Rinks [facility] 01/24/2015 9:03 AM

## 2015-01-24 NOTE — Progress Notes (Signed)
[facility] PROGRESS NOTE  Erik [name] [id number] DOB: 01/20/1941 DOA: 01/22/2015  PCP: [name], MD  Brief HPI: 74 year old Caucasian male with a past medical history of coronary artery disease, diabetes type 2, atrial fibrillation on anticoagulation, chronic kidney disease, presented with abdominal pain and back pain. Was noted to have acute pancreatitis. Thought to be secondary to gallstones.  Past medical history:  Past Medical History  Diagnosis Date  . Hyperlipidemia   . Hypertension   . Diabetes mellitus   . GERD (gastroesophageal reflux disease)   . NICM (nonischemic cardiomyopathy)     a. EF 35-40% in 2009, 55% in 2010. b. 09/2013: 35-40% by echo and 48% by nuc.  . Coronary artery disease     a. Nonobstructive by cath 2004. b. Nuc 09/2013 - no ischemia, fixed defect suggestive of possible prior infarction, felt low risk, EF 48%.   . Premature ventricular contractions   . Atrial fibrillation     a. 09/2013: RVR then bradycardia then spont conv to NSR.  . CKD (chronic kidney disease), stage III   . 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 surgery. Cardiology.  Procedures:  2-D echocardiogram Study Conclusions - Left ventricle: The cavity size was normal. Systolic function wasnormal. The estimated ejection fraction was in the range of 50%to 55%. There is hypokinesis of the basal-midinferior myocardium.Doppler parameters are consistent with abnormal left ventricularrelaxation (grade 1 diastolic dysfunction). - Ventricular septum: Septal motion showed paradox. - Mitral valve: Mildly calcified annulus. There was mildregurgitation. - Left atrium: The atrium was mildly dilated. Impressions:- Compared to the prior study, there has been no significantinterval change.  Antibiotics: None  Subjective: Patient continues to have upper abdominal pain. Denies any nausea, vomiting. Overall does feel better compared to when he was  admitted. Denies any chest pain, shortness of breath.   Objective: Vital Signs  Filed Vitals:   01/23/15 1752 01/23/15 2006 01/24/15 0438 01/24/15 0937  BP: 140/72 145/98 114/52 114/88  Pulse: 104  93 80  Temp:  99.6 F (37.6 C) 98.6 F (37 C) 98.2 F (36.8 C)  TempSrc:  Oral Oral Oral  Resp:  17 17 18   Height:      Weight:  91.2 kg (201 lb 1 oz)    SpO2: 92% 93% 93% 96%    Intake/Output Summary (Last 24 hours) at 01/24/15 1108 Last data filed at 01/24/15 0900  Gross per 24 hour  Intake      0 ml  Output    450 ml  Net   -450 ml   Filed Weights   01/22/15 1118 01/22/15 2020 01/23/15 2006  Weight: 91.944 kg (202 lb 11.2 oz) 90.81 kg (200 lb 3.2 oz) 91.2 kg (201 lb 1 oz)    General appearance: alert, cooperative, appears stated age and no distress Resp: Diminished air entry at the bases, right more than left with a few crackles. Cardio: regular rate and rhythm, S1, S2 normal, no murmur, click, rub or gallop GI: Mildly tender in the epigastrium. Sluggish bowel sounds but present. No rebound, rigidity or guarding. No masses or organomegaly. Extremities: extremities normal, atraumatic, no cyanosis or edema Neurologic: No focal deficits  Lab Results:  Basic Metabolic Panel:  Recent Labs Lab 01/22/15 1155 01/23/15 0259 01/24/15 0700  NA 139 140 135  K 4.3 4.8 4.5  CL 107 106 105  CO2 26 28 23   GLUCOSE 287* 162* 135*  BUN 15 16 25*  CREATININE 1.75*  1.59* 2.11*  CALCIUM 8.7* 8.2* 7.2*   Liver Function Tests:  Recent Labs Lab 01/22/15 1155 01/23/15 0259 01/24/15 0700  AST 47* 50* 25  ALT 26 43 26  ALKPHOS 73 70 52  BILITOT 1.2 1.1 1.3*  PROT 6.0* 5.9* 5.3*  ALBUMIN 3.5 3.4* 3.0*    Recent Labs Lab 01/22/15 1155 01/23/15 0259 01/24/15 0700  LIPASE 1940* 800* 86*  AMYLASE  --  620*  --    CBC:  Recent Labs Lab 01/22/15 1155 01/23/15 0259  WBC 8.1 8.9  HGB 12.4* 12.6*  HCT 37.2* 38.3*  MCV 85.1 84.9  PLT 125* 131*    CBG:  Recent  Labs Lab 01/23/15 1706 01/23/15 2005 01/24/15 0013 01/24/15 0436 01/24/15 0723  GLUCAP 96 112* 120* 147* 130*    No results found for this or any previous visit (from the past 240 hour(s)).    Studies/Results: Dg Chest 2 View  01/23/2015   CLINICAL DATA:  Patient unable to stand  EXAM: CHEST  2 VIEW  COMPARISON:  09/16/2013  FINDINGS: Heart size is normal. There is mild asymmetric elevation of the right hemidiaphragm. Subsegmental atelectasis noted in the left base. No airspace consolidation.  IMPRESSION: 1. Asymmetric elevation of right hemidiaphragm. 2. Left base atelectasis.   Electronically Signed   By: Kerby Moors M.D.   On: 01/23/2015 11:12   Ct Abdomen Pelvis W Contrast  01/22/2015   CLINICAL DATA:  Epigastric pain and right lower quadrant pain radiating to back.  EXAM: CT ABDOMEN AND PELVIS WITH CONTRAST  TECHNIQUE: Multidetector CT imaging of the abdomen and pelvis was performed using the standard protocol following bolus administration of intravenous contrast.  CONTRAST:  28mL OMNIPAQUE IOHEXOL 300 MG/ML  SOLN  COMPARISON:  None.  FINDINGS: Lung bases are within normal. There is calcified plaque over the left anterior descending coronary artery.  Abdominal images demonstrate mild cholelithiasis. The liver, spleen and adrenal glands are normal. Appendix is normal. Small bowel and colon are within normal.  There is moderate peripancreatic fluid as well as fluid over the right pericolic gutter. Findings are compatible with acute pancreatitis. No significant complicating features.  A horseshoe kidney is present. There is no hydronephrosis or nephrolithiasis 3.1 cm simple renal cyst over the right-sided component of the horseshoe kidney. Ureters are normal.  Mild calcified plaque over the abdominal aorta and iliac arteries.  Pelvic images demonstrate mild prominence of the prostate gland as the bladder and rectum are otherwise within normal.  There are moderate degenerative changes of the  spine with prominent anterior osteophytes. Mild degenerate change of the hips.  IMPRESSION: Evidence of acute pancreatitis likely related to patient's gallbladder stone disease. No complicating features.  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 3.1 cm cyst over the right-sided component of the horseshoe kidney.  Mild prostatic enlargement.  Atherosclerotic coronary artery disease.   Electronically Signed   By: Marin Olp M.D.   On: 01/22/2015 14:43    Medications:  Scheduled: . diltiazem  60 mg Oral Q6H  . insulin aspart  0-9 Units Subcutaneous 6 times per day  . insulin glargine  15 Units Subcutaneous QHS  . metoprolol  5 mg Intravenous 4 times per day  . pantoprazole (PROTONIX) IV  40 mg Intravenous Q24H  . sodium chloride  1,000 mL Intravenous Once  . sodium chloride  3 mL Intravenous Q12H   Continuous:   [id number] **OR** acetaminophen, artificial tears, HYDROmorphone (DILAUDID) injection, iohexol, LORazepam, ondansetron **OR** ondansetron (ZOFRAN) IV  Assessment/Plan:  Principal Problem:   Acute pancreatitis Active Problems:   Diabetes mellitus   CKD (chronic kidney disease), stage III   Atrial fibrillation   NICM (nonischemic cardiomyopathy)   Hypertension   GERD (gastroesophageal reflux disease)    Acute biliary pancreatitis Patient is improving slowly. Lipase is improving. General surgery is following. Plan is for cholecystectomy prior to discharge. Patient was also recently started on Tradjenta which can also cause pancreatitis. Continue current management. Nothing by mouth.  Cholelithiasis As above. Will need cholecystectomy prior to discharge. Cardiology is following. Patient does have a history of coronary artery disease. Had a low risk stress test last year. Echocardiogram as above. He should be able to proceed to surgery without further testing. His x-ray did not show any edema.   Atrial fibrillation with RVR On 8/29 he was noted to be  in rapid ventricular response. He was given multiple doses of metoprolol intravenously and then started on Cardizem orally. Heart rate is better controlled, although he still remains in atrial fibrillation. Holding his oral anticoagulation. No need for intravenous heparin per cardiology.  Acute on chronic kidney disease stage III Creatinine has crept up today. He continues to make urine. EF is noted to be normal. Okay to continue IV fluids for now. Repeat renal function or morning.  History of diabetes mellitus type 2 Continue Lantus and sliding scale coverage. Monitor CBGs closely. Holding oral agents. CBGs are well controlled.  History of essential hypertension Stable. Blood pressure is reasonably well controlled.   DVT Prophylaxis: SCDs    Code Status: Full code   Family Communication: Discussed with the patient   Disposition Plan:  We will need to undergo cholecystectomy prior to discharge.     LOS: 2 days   [facility] Pager [phone] 01/24/2015, 11:08 AM  If 7PM-7AM, please contact night-coverage at [email or web address], password [facility]

## 2015-01-25 ENCOUNTER — Encounter (HOSPITAL_COMMUNITY): Payer: Self-pay | Admitting: Certified Registered Nurse Anesthetist

## 2015-01-25 ENCOUNTER — Encounter (HOSPITAL_COMMUNITY)
Admission: EM | Disposition: A | Discharge status: Home / Self Care | Payer: Self-pay | Source: Home / Self Care | Attending: Internal Medicine

## 2015-01-25 ENCOUNTER — Inpatient Hospital Stay (HOSPITAL_COMMUNITY): Payer: Medicare Other | Admitting: Certified Registered Nurse Anesthetist

## 2015-01-25 DIAGNOSIS — K851 Biliary acute pancreatitis: Principal | ICD-10-CM

## 2015-01-25 DIAGNOSIS — N179 Acute kidney failure, unspecified: Secondary | ICD-10-CM

## 2015-01-25 DIAGNOSIS — N189 Chronic kidney disease, unspecified: Secondary | ICD-10-CM

## 2015-01-25 HISTORY — PX: CHOLECYSTECTOMY: SHX55

## 2015-01-25 LAB — COMPREHENSIVE METABOLIC PANEL
ALK PHOS: 55 U/L (ref 38–126)
ALT: 20 U/L (ref 17–63)
ANION GAP: 10 (ref 5–15)
AST: 22 U/L (ref 15–41)
Albumin: 2.9 g/dL — ABNORMAL LOW (ref 3.5–5.0)
BILIRUBIN TOTAL: 1.5 mg/dL — AB (ref 0.3–1.2)
BUN: 37 mg/dL — ABNORMAL HIGH (ref 6–20)
CALCIUM: 7.1 mg/dL — AB (ref 8.9–10.3)
CO2: 21 mmol/L — AB (ref 22–32)
CREATININE: 2.3 mg/dL — AB (ref 0.61–1.24)
Chloride: 107 mmol/L (ref 101–111)
GFR, EST AFRICAN AMERICAN: 30 mL/min — AB (ref >60)
GFR, EST NON AFRICAN AMERICAN: 26 mL/min — AB (ref >60)
Glucose, Bld: 137 mg/dL — ABNORMAL HIGH (ref 65–99)
Potassium: 4.5 mmol/L (ref 3.5–5.1)
Sodium: 138 mmol/L (ref 135–145)
TOTAL PROTEIN: 5.6 g/dL — AB (ref 6.5–8.1)

## 2015-01-25 LAB — CBC
HCT: 34.3 % — ABNORMAL LOW (ref 39.0–52.0)
HEMOGLOBIN: 10.8 g/dL — AB (ref 13.0–17.0)
MCH: 27.6 pg (ref 26.0–34.0)
MCHC: 31.5 g/dL (ref 30.0–36.0)
MCV: 87.5 fL (ref 78.0–100.0)
PLATELETS: 121 10*3/uL — AB (ref 150–400)
RBC: 3.92 MIL/uL — AB (ref 4.22–5.81)
RDW: 14.1 % (ref 11.5–15.5)
WBC: 11.1 10*3/uL — AB (ref 4.0–10.5)

## 2015-01-25 LAB — GLUCOSE, CAPILLARY
GLUCOSE-CAPILLARY: 130 mg/dL — AB (ref 65–99)
GLUCOSE-CAPILLARY: 137 mg/dL — AB (ref 65–99)
GLUCOSE-CAPILLARY: 144 mg/dL — AB (ref 65–99)
GLUCOSE-CAPILLARY: 156 mg/dL — AB (ref 65–99)
GLUCOSE-CAPILLARY: 167 mg/dL — AB (ref 65–99)
Glucose-Capillary: 129 mg/dL — ABNORMAL HIGH (ref 65–99)

## 2015-01-25 SURGERY — LAPAROSCOPIC CHOLECYSTECTOMY WITH INTRAOPERATIVE CHOLANGIOGRAM
Anesthesia: General | Site: Abdomen

## 2015-01-25 MED ORDER — MEPERIDINE HCL 25 MG/ML IJ SOLN: 6.2500 mg | INTRAMUSCULAR | Status: DC | PRN

## 2015-01-25 MED ORDER — ALBUTEROL SULFATE (2.5 MG/3ML) 0.083% IN NEBU
INHALATION_SOLUTION | RESPIRATORY_TRACT | Status: AC
  Filled 2015-01-25: qty 3

## 2015-01-25 MED ORDER — FENTANYL CITRATE (PF) 100 MCG/2ML IJ SOLN
INTRAMUSCULAR | Status: DC | PRN
  Administered 2015-01-25: 50 ug via INTRAVENOUS

## 2015-01-25 MED ORDER — CEFAZOLIN SODIUM-DEXTROSE 2-3 GM-% IV SOLR
INTRAVENOUS | Status: DC | PRN
  Administered 2015-01-25: 2 g via INTRAVENOUS

## 2015-01-25 MED ORDER — ONDANSETRON HCL 4 MG/2ML IJ SOLN
INTRAMUSCULAR | Status: DC | PRN
  Administered 2015-01-25: 4 mg via INTRAVENOUS

## 2015-01-25 MED ORDER — SUGAMMADEX SODIUM 200 MG/2ML IV SOLN
INTRAVENOUS | Status: AC
  Filled 2015-01-25: qty 2

## 2015-01-25 MED ORDER — BUPIVACAINE-EPINEPHRINE 0.25% -1:200000 IJ SOLN
INTRAMUSCULAR | Status: DC | PRN
  Administered 2015-01-25: 20 mL

## 2015-01-25 MED ORDER — FENTANYL CITRATE (PF) 100 MCG/2ML IJ SOLN
INTRAMUSCULAR | Status: AC
  Administered 2015-01-25: 25 ug via INTRAVENOUS
  Filled 2015-01-25: qty 2

## 2015-01-25 MED ORDER — OXYCODONE HCL 5 MG PO TABS
ORAL_TABLET | ORAL | Status: AC
  Administered 2015-01-25: 10 mg via ORAL
  Filled 2015-01-25: qty 2

## 2015-01-25 MED ORDER — CEFAZOLIN SODIUM-DEXTROSE 2-3 GM-% IV SOLR
INTRAVENOUS | Status: AC
  Filled 2015-01-25: qty 50

## 2015-01-25 MED ORDER — LACTATED RINGERS IV SOLN
INTRAVENOUS | Status: DC | PRN
  Administered 2015-01-25: 14:00:00 via INTRAVENOUS

## 2015-01-25 MED ORDER — SUGAMMADEX SODIUM 200 MG/2ML IV SOLN
INTRAVENOUS | Status: DC | PRN
  Administered 2015-01-25: 200 mg via INTRAVENOUS

## 2015-01-25 MED ORDER — 0.9 % SODIUM CHLORIDE (POUR BTL) OPTIME
TOPICAL | Status: DC | PRN
  Administered 2015-01-25: 1000 mL

## 2015-01-25 MED ORDER — ONDANSETRON HCL 4 MG/2ML IJ SOLN
INTRAMUSCULAR | Status: AC
  Filled 2015-01-25: qty 2

## 2015-01-25 MED ORDER — PROPOFOL 10 MG/ML IV BOLUS
INTRAVENOUS | Status: DC | PRN
  Administered 2015-01-25: 30 mg via INTRAVENOUS
  Administered 2015-01-25: 150 mg via INTRAVENOUS
  Administered 2015-01-25: 30 mg via INTRAVENOUS

## 2015-01-25 MED ORDER — ROCURONIUM BROMIDE 100 MG/10ML IV SOLN
INTRAVENOUS | Status: DC | PRN
  Administered 2015-01-25: 20 mg via INTRAVENOUS
  Administered 2015-01-25 (×3): 10 mg via INTRAVENOUS

## 2015-01-25 MED ORDER — OXYCODONE HCL 5 MG PO TABS
5.0000 mg | ORAL_TABLET | ORAL | Status: DC | PRN
  Administered 2015-01-25 – 2015-01-29 (×6): 10 mg via ORAL
  Filled 2015-01-25 (×4): qty 2
  Filled 2015-01-25: qty 1
  Filled 2015-01-25 (×2): qty 2

## 2015-01-25 MED ORDER — HYDROMORPHONE HCL 1 MG/ML IJ SOLN
0.2500 mg | INTRAMUSCULAR | Status: DC | PRN
  Administered 2015-01-25: 0.5 mg via INTRAVENOUS

## 2015-01-25 MED ORDER — ALBUTEROL SULFATE (2.5 MG/3ML) 0.083% IN NEBU
2.5000 mg | INHALATION_SOLUTION | Freq: Once | RESPIRATORY_TRACT | Status: AC
  Administered 2015-01-25: 2.5 mg via RESPIRATORY_TRACT

## 2015-01-25 MED ORDER — HYDROMORPHONE HCL 1 MG/ML IJ SOLN
INTRAMUSCULAR | Status: AC
  Filled 2015-01-25: qty 1

## 2015-01-25 MED ORDER — FENTANYL CITRATE (PF) 250 MCG/5ML IJ SOLN
INTRAMUSCULAR | Status: AC
Start: 2015-01-25 — End: 2015-01-25
  Filled 2015-01-25: qty 5

## 2015-01-25 MED ORDER — SODIUM CHLORIDE 0.9 % IR SOLN
Status: DC | PRN
  Administered 2015-01-25: 1

## 2015-01-25 MED ORDER — FENTANYL CITRATE (PF) 100 MCG/2ML IJ SOLN
25.0000 ug | INTRAMUSCULAR | Status: DC | PRN
  Administered 2015-01-25 (×4): 25 ug via INTRAVENOUS

## 2015-01-25 MED ORDER — NEOSTIGMINE METHYLSULFATE 10 MG/10ML IV SOLN
INTRAVENOUS | Status: AC
  Filled 2015-01-25: qty 4

## 2015-01-25 MED ORDER — LACTATED RINGERS IV SOLN
INTRAVENOUS | Status: DC
  Administered 2015-01-25: 13:00:00 via INTRAVENOUS

## 2015-01-25 MED ORDER — HEMOSTATIC AGENTS (NO CHARGE) OPTIME
TOPICAL | Status: DC | PRN
  Administered 2015-01-25: 1 via TOPICAL

## 2015-01-25 MED ORDER — NEOSTIGMINE METHYLSULFATE 10 MG/10ML IV SOLN
INTRAVENOUS | Status: DC | PRN
  Administered 2015-01-25: 4 mg via INTRAVENOUS

## 2015-01-25 MED ORDER — LIDOCAINE HCL (CARDIAC) 20 MG/ML IV SOLN
INTRAVENOUS | Status: DC | PRN
  Administered 2015-01-25: 100 mg via INTRAVENOUS

## 2015-01-25 MED ORDER — ACETAMINOPHEN 10 MG/ML IV SOLN
1000.0000 mg | Freq: Once | INTRAVENOUS | Status: AC | PRN
  Administered 2015-01-25: 1000 mg via INTRAVENOUS

## 2015-01-25 MED ORDER — IPRATROPIUM BROMIDE 0.02 % IN SOLN
0.5000 mg | Freq: Once | RESPIRATORY_TRACT | Status: AC
  Administered 2015-01-25: 0.5 mg via RESPIRATORY_TRACT
  Filled 2015-01-25: qty 2.5

## 2015-01-25 MED ORDER — SODIUM CHLORIDE 0.9 % IV BOLUS (SEPSIS)
500.0000 mL | Freq: Once | INTRAVENOUS | Status: AC
  Administered 2015-01-25: 500 mL via INTRAVENOUS

## 2015-01-25 MED ORDER — SODIUM CHLORIDE 0.9 % IV SOLN
INTRAVENOUS | Status: DC
  Administered 2015-01-25 (×2): via INTRAVENOUS

## 2015-01-25 MED ORDER — PROMETHAZINE HCL 25 MG/ML IJ SOLN: 6.2500 mg | INTRAMUSCULAR | Status: DC | PRN

## 2015-01-25 MED ORDER — PROPOFOL 10 MG/ML IV BOLUS
INTRAVENOUS | Status: AC
  Filled 2015-01-25: qty 20

## 2015-01-25 MED ORDER — PHENYLEPHRINE HCL 10 MG/ML IJ SOLN
INTRAMUSCULAR | Status: DC | PRN
  Administered 2015-01-25: 40 ug via INTRAVENOUS
  Administered 2015-01-25: 80 ug via INTRAVENOUS
  Administered 2015-01-25 (×2): 40 ug via INTRAVENOUS

## 2015-01-25 MED ORDER — GLYCOPYRROLATE 0.2 MG/ML IJ SOLN
INTRAMUSCULAR | Status: AC
  Filled 2015-01-25: qty 3

## 2015-01-25 MED ORDER — ACETAMINOPHEN 10 MG/ML IV SOLN
INTRAVENOUS | Status: AC
  Filled 2015-01-25: qty 100

## 2015-01-25 SURGICAL SUPPLY — 40 items
APPLIER CLIP 5 13 M/L LIGAMAX5 (MISCELLANEOUS) ×3
BLADE SURG CLIPPER 3M 9600 (MISCELLANEOUS) IMPLANT
CANISTER SUCTION 2500CC (MISCELLANEOUS) ×3 IMPLANT
CHLORAPREP W/TINT 26ML (MISCELLANEOUS) ×3 IMPLANT
CLIP APPLIE 5 13 M/L LIGAMAX5 (MISCELLANEOUS) ×1 IMPLANT
COVER MAYO STAND STRL (DRAPES) ×3 IMPLANT
COVER SURGICAL LIGHT HANDLE (MISCELLANEOUS) ×3 IMPLANT
DRAPE C-ARM 42X72 X-RAY (DRAPES) ×3 IMPLANT
ELECT REM PT RETURN 9FT ADLT (ELECTROSURGICAL) ×3
ELECTRODE REM PT RTRN 9FT ADLT (ELECTROSURGICAL) ×1 IMPLANT
GLOVE BIOGEL PI IND STRL 7.0 (GLOVE) ×1 IMPLANT
GLOVE BIOGEL PI IND STRL 7.5 (GLOVE) ×1 IMPLANT
GLOVE BIOGEL PI INDICATOR 7.0 (GLOVE) ×2
GLOVE BIOGEL PI INDICATOR 7.5 (GLOVE) ×2
GLOVE SURG SIGNA 7.5 PF LTX (GLOVE) ×3 IMPLANT
GLOVE SURG SS PI 7.0 STRL IVOR (GLOVE) ×3 IMPLANT
GLOVE SURG SS PI 7.5 STRL IVOR (GLOVE) ×3 IMPLANT
GOWN STRL REUS W/ TWL LRG LVL3 (GOWN DISPOSABLE) ×2 IMPLANT
GOWN STRL REUS W/ TWL XL LVL3 (GOWN DISPOSABLE) ×1 IMPLANT
GOWN STRL REUS W/TWL LRG LVL3 (GOWN DISPOSABLE) ×4
GOWN STRL REUS W/TWL XL LVL3 (GOWN DISPOSABLE) ×2
HEMOSTAT SNOW SURGICEL 2X4 (HEMOSTASIS) ×3 IMPLANT
KIT BASIN OR (CUSTOM PROCEDURE TRAY) ×3 IMPLANT
KIT ROOM TURNOVER OR (KITS) ×3 IMPLANT
LIQUID BAND (GAUZE/BANDAGES/DRESSINGS) ×12 IMPLANT
NS IRRIG 1000ML POUR BTL (IV SOLUTION) ×3 IMPLANT
PAD ARMBOARD 7.5X6 YLW CONV (MISCELLANEOUS) ×3 IMPLANT
POUCH SPECIMEN RETRIEVAL 10MM (ENDOMECHANICALS) ×3 IMPLANT
SCISSORS LAP 5X35 DISP (ENDOMECHANICALS) ×3 IMPLANT
SET CHOLANGIOGRAPH 5 50 .035 (SET/KITS/TRAYS/PACK) ×3 IMPLANT
SET IRRIG TUBING LAPAROSCOPIC (IRRIGATION / IRRIGATOR) ×3 IMPLANT
SLEEVE ENDOPATH XCEL 5M (ENDOMECHANICALS) ×6 IMPLANT
SPECIMEN JAR SMALL (MISCELLANEOUS) ×3 IMPLANT
SUT MON AB 4-0 PC3 18 (SUTURE) ×3 IMPLANT
TOWEL OR 17X24 6PK STRL BLUE (TOWEL DISPOSABLE) ×3 IMPLANT
TOWEL OR 17X26 10 PK STRL BLUE (TOWEL DISPOSABLE) ×3 IMPLANT
TRAY LAPAROSCOPIC MC (CUSTOM PROCEDURE TRAY) ×3 IMPLANT
TROCAR XCEL BLUNT TIP 100MML (ENDOMECHANICALS) ×3 IMPLANT
TROCAR XCEL NON-BLD 5MMX100MML (ENDOMECHANICALS) ×3 IMPLANT
TUBING INSUFFLATION (TUBING) ×3 IMPLANT

## 2015-01-25 NOTE — Progress Notes (Addendum)
[facility] PROGRESS NOTE  Erik [name] [id number] DOB: 1940-07-27 DOA: 01/22/2015  PCP: [name], MD  Brief HPI: 74 year old Caucasian male with a past medical history of coronary artery disease, diabetes type 2, atrial fibrillation on anticoagulation, chronic kidney disease, presented with abdominal pain and back pain. Was noted to have acute pancreatitis. Thought to be secondary to gallstones, plan is for lap cholecystectomy on 8/31.  Past medical history:  Past Medical History  Diagnosis Date  . Hyperlipidemia   . Hypertension   . Diabetes mellitus   . GERD (gastroesophageal reflux disease)   . NICM (nonischemic cardiomyopathy)     a. EF 35-40% in 2009, 55% in 2010. b. 09/2013: 35-40% by echo and 48% by nuc.  . Coronary artery disease     a. Nonobstructive by cath 2004. b. Nuc 09/2013 - no ischemia, fixed defect suggestive of possible prior infarction, felt low risk, EF 48%.   . Premature ventricular contractions   . Atrial fibrillation     a. 09/2013: RVR then bradycardia then spont conv to NSR.  . CKD (chronic kidney disease), stage III   . 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 surgery. Cardiology.  Procedures:  2-D echocardiogram Study Conclusions - Left ventricle: The cavity size was normal. Systolic function wasnormal. The estimated ejection fraction was in the range of 50%to 55%. There is hypokinesis of the basal-midinferior myocardium.Doppler parameters are consistent with abnormal left ventricularrelaxation (grade 1 diastolic dysfunction). - Ventricular septum: Septal motion showed paradox. - Mitral valve: Mildly calcified annulus. There was mildregurgitation. - Left atrium: The atrium was mildly dilated. Impressions:- Compared to the prior study, there has been no significantinterval change.  Antibiotics: None  Subjective: Patient continues to have upper abdominal pain. Denies any nausea, vomiting. Denies any  chest pain, shortness of breath.   Objective: Vital Signs  Filed Vitals:   01/25/15 1540 01/25/15 1554 01/25/15 1609 01/25/15 1624  BP: 98/63 122/72 134/65 132/69  Pulse: 126 110 109 120  Temp:      TempSrc:      Resp: 30 25 16 31   Height:      Weight:      SpO2: 97% 93% 97% 98%    Intake/Output Summary (Last 24 hours) at 01/25/15 1643 Last data filed at 01/25/15 0900  Gross per 24 hour  Intake      0 ml  Output    550 ml  Net   -550 ml   Filed Weights   01/22/15 2020 01/23/15 2006 01/24/15 1900  Weight: 90.81 kg (200 lb 3.2 oz) 91.2 kg (201 lb 1 oz) 92.6 kg (204 lb 2.3 oz)    General appearance: alert, cooperative, appears stated age and no distress Resp: Diminished air entry at the bases, right more than left with a few crackles. Cardio: regular rate and rhythm, S1, S2 normal, no murmur, click, rub or gallop GI: Mildly tender in the epigastrium. Sluggish bowel sounds but present. No rebound, rigidity or guarding.  Extremities: extremities normal, atraumatic, no cyanosis or edema Neurologic: No focal deficits  Lab Results:  Basic Metabolic Panel:  Recent Labs Lab 01/22/15 1155 01/23/15 0259 01/24/15 0700 01/25/15 0432  NA 139 140 135 138  K 4.3 4.8 4.5 4.5  CL 107 106 105 107  CO2 26 28 23  21*  GLUCOSE 287* 162* 135* 137*  BUN 15 16 25* 37*  CREATININE 1.75* 1.59* 2.11* 2.30*  CALCIUM 8.7* 8.2* 7.2* 7.1*   Liver  Function Tests:  Recent Labs Lab 01/22/15 1155 01/23/15 0259 01/24/15 0700 01/25/15 0432  AST 47* 50* 25 22  ALT 26 43 26 20  ALKPHOS 73 70 52 55  BILITOT 1.2 1.1 1.3* 1.5*  PROT 6.0* 5.9* 5.3* 5.6*  ALBUMIN 3.5 3.4* 3.0* 2.9*    Recent Labs Lab 01/22/15 1155 01/23/15 0259 01/24/15 0700  LIPASE 1940* 800* 86*  AMYLASE  --  620*  --    CBC:  Recent Labs Lab 01/22/15 1155 01/23/15 0259 01/25/15 0432  WBC 8.1 8.9 11.1*  HGB 12.4* 12.6* 10.8*  HCT 37.2* 38.3* 34.3*  MCV 85.1 84.9 87.5  PLT 125* 131* 121*     CBG:  Recent Labs Lab 01/24/15 2356 01/25/15 0354 01/25/15 0740 01/25/15 1208 01/25/15 1527  GLUCAP 137* 130* 129* 144* 167*    No results found for this or any previous visit (from the past 240 hour(s)).    Studies/Results: No results found.  Medications:  Scheduled: . acetaminophen      . albuterol      . [MAR Hold] diltiazem  60 mg Oral Q6H  . fentaNYL      . HYDROmorphone      . [MAR Hold] insulin aspart  0-9 Units Subcutaneous 6 times per day  . [MAR Hold] insulin glargine  15 Units Subcutaneous QHS  . [MAR Hold] metoprolol  5 mg Intravenous 4 times per day  . [MAR Hold] pantoprazole (PROTONIX) IV  40 mg Intravenous Q24H  . [MAR Hold] sodium chloride  3 mL Intravenous Q12H   Continuous: . sodium chloride 100 mL/hr at 01/25/15 0830  . lactated ringers 50 mL/hr at 01/25/15 1317   PRN:[MAR Hold] acetaminophen **OR** [MAR Hold] acetaminophen, [MAR Hold] artificial tears, fentaNYL (SUBLIMAZE) injection, HYDROmorphone (DILAUDID) injection, [MAR Hold]  HYDROmorphone (DILAUDID) injection, [MAR Hold] iohexol, [MAR Hold] LORazepam, meperidine (DEMEROL) injection, [MAR Hold] ondansetron **OR** [MAR Hold] ondansetron (ZOFRAN) IV, promethazine  Assessment/Plan:  Principal Problem:   Acute pancreatitis Active Problems:   Diabetes mellitus   CKD (chronic kidney disease), stage III   Atrial fibrillation   NICM (nonischemic cardiomyopathy)   Hypertension   GERD (gastroesophageal reflux disease)   Acute biliary pancreatitis   Acute on chronic renal failure    Acute biliary pancreatitis - Lipase is improving.  - General surgery is following. Plan is for cholecystectomy today.  - Patient was also recently started on Tradjenta which can also cause pancreatitis. Continue current management.  Cholelithiasis As above. Laparoscopic cholecystectomy planned for 8/31. Cardiology is following. Patient does have a history of coronary artery disease. Had a low risk stress test  last year. Echocardiogram as above. He should be able to proceed to surgery without further testing. His x-ray did not show any edema.   Atrial fibrillation with RVR On 8/29 he was noted to be in rapid ventricular response. He was given multiple doses of metoprolol intravenously and then started on Cardizem orally. Heart rate is better controlled, although he still remains in atrial fibrillation. Holding his oral anticoagulation. No need for intravenous heparin per cardiology.  Acute on chronic kidney disease stage III - Creatinine continues to gradually increase , today is 2.3 , baseline is around 2 ,Creatinine has crept up today. He continues to make urine. EF is noted to be normal. Okay to continue IV fluids for now. Repeat renal function or morning.  History of diabetes mellitus type 2 Continue Lantus and sliding scale coverage. Monitor CBGs closely. Holding oral agents. CBGs are well controlled.  History of essential hypertension Stable. Blood pressure is reasonably well controlled.   DVT Prophylaxis: SCDs    Code Status: Full code   Family Communication: Discussed with the patient  , and son at bedside Disposition Plan:  Pending further workup.  Time spent: 30 minutes   LOS: 3 days   [facility], Vernia Teem  [facility] Pager [phone] 01/25/2015, 4:43 PM  If 7PM-7AM, please contact night-coverage at [email or web address], password [facility]

## 2015-01-25 NOTE — Transfer of Care (Signed)
Immediate Anesthesia Transfer of Care Note  Patient: Erik [name]  Procedure(s) Performed: Procedure(s): LAPAROSCOPIC CHOLECYSTECTOMY  (N/A)  Patient Location: PACU  Anesthesia Type:General  Level of Consciousness: awake and alert   Airway & Oxygen Therapy: Patient Spontanous Breathing and Patient connected to nasal cannula oxygen  Post-op Assessment: Report given to RN, Post -op Vital signs reviewed and stable and Patient moving all extremities X 4  Post vital signs: Reviewed and stable  Last Vitals:  Filed Vitals:   01/25/15 0900  BP: 118/71  Pulse: 70  Temp: 36.7 C  Resp: 18    Complications: No apparent anesthesia complications

## 2015-01-25 NOTE — Op Note (Signed)
LAPAROSCOPIC CHOLECYSTECTOMY   Procedure Note  Erik [name] 01/22/2015 - 01/25/2015   Pre-op Diagnosis: Gallstone Pancreatitis     Post-op Diagnosis: Gallstone Pancreatitis with Acute Cholecystitis  Procedure(s): LAPAROSCOPIC CHOLECYSTECTOMY   Surgeon(s): [name], MD  Anesthesia: General  Staff:  Circulator: Cyd Silence, RN Scrub Person: Jesse Sans, CST; Buddy Duty, CST; Rushdan Jerilynn Mages Islam, RN  Estimated Blood Loss: less than 50 mL               Specimens: sent to path  Indications: This gentleman presents with acute pancreatitis which is suspected to be from gallstones. He now presents for laparoscopic cholecystectomy.  Findings: The patient had an acutely inflamed gallbladder which was very friable. Secondary to this and [name] very dilated transverse colon, I was unable to perform [name] cholangiogram  Procedure: The patient was brought to the operating room and identified as the correct patient. He was placed supine on the operating room table and general [name] seizure was induced. His abdomen was then prepped and draped in the usual sterile fashion. I made [name] small incision below the umbilicus. I carried this down to the fascia which was then opened with [name] scalpel. [name] hemostat was then used to pass into the peritoneal cavity under vision. [name] zero Vicryls Purstring suture was then placed around the fascial opening. The 12 mm port was placed that he opening and insufflation of the abdomen was begun. The patient had [name] very distended transverse colon. His liver was also very high above the right costal margin. I placed [name] 5 mm trocar in the epigastrium and 2 more in the right upper quadrant all under direct vision. I then grasped the gallbladder and retracted of the liver bed. The gallbladder immediately tore leaking bile.  I dissected out the base of the gallbladder. I was able to dissect out the cystic duct and cystic artery and achieve [name] critical view around both. I clipped the  artery proximally and distally. The cystic duct for open at the gallbladder. I try to insert [name] claims [name] catheter in the right upper quadrant but the colon was so dilated, I aborted this for fear of puncturing the colon. I thus clipped the cystic duct 3 times proximal and transected it. The gallbladder was then slowly dissected free from the liver bed with electrocautery. I did place several surgical clips at the posterior branch of the cystic artery and also use surgical snow to achieve hemostasis. Once the gallbladder was removed, I placed it in an Endosac and removed into the incision at the umbilicus. I irrigated the abdomen thoroughly with saline. Hemostasis appeared to be achieved. All ports were removed under direct vision and the abdomen was deflated. The Vicryls suture at the umbilicus was tied in place closing the fascial defect. All incisions were anesthetized with Marcaine and closed with 4. 0 Monocryl sutures. Skin glue was then applied. The patient tolerated the procedure well. All the counts were correct at the end of the procedure. The patient was extubated in the operating room and taken in [name] stable condition to the recovery room.          Erik [name]   Date: 01/25/2015  Time: 2:54 PM

## 2015-01-25 NOTE — Anesthesia Preprocedure Evaluation (Addendum)
Anesthesia Evaluation  Patient identified by MRN, date of birth, ID band Patient awake    Reviewed: Allergy & Precautions, NPO status , Patient's Chart, lab work & pertinent test results  Airway Mallampati: II  TM Distance: >3 FB Neck ROM: Full    Dental no notable dental hx. (+) Edentulous Upper, Dental Advisory Given   Pulmonary neg pulmonary ROS, former smoker,  breath sounds clear to auscultation  Pulmonary exam normal       Cardiovascular hypertension, Pt. on medications + CAD Normal cardiovascular examRhythm:Regular Rate:Normal  Echo 01/23/2015 - Left ventricle: The cavity size was normal. Systolic function wasnormal. The estimated ejection fraction was in the range of 50%to 55%. There is hypokinesis of the basal-midinferior myocardium.Doppler parameters are consistent with abnormal left ventricularrelaxation (grade 1 diastolic dysfunction). - Ventricular septum: Septal motion showed paradox. - Mitral valve: Mildly calcified annulus. There was mild regurgitation. - Left atrium: The atrium was mildly dilated.  Impressions: - Compared to the prior study, there has been no significantinterval change.    Neuro/Psych negative neurological ROS  negative psych ROS   GI/Hepatic Neg liver ROS, GERD-  ,  Endo/Other  negative endocrine ROSdiabetes, Type 2, Oral Hypoglycemic Agents  Renal/GU Renal disease     Musculoskeletal negative musculoskeletal ROS (+)   Abdominal   Peds  Hematology negative hematology ROS (+)   Anesthesia Other Findings   Reproductive/Obstetrics                           Anesthesia Physical Anesthesia Plan  ASA: III  Anesthesia Plan: General   Post-op Pain Management:    Induction: Intravenous  Airway Management Planned: Oral ETT  Additional Equipment:   Intra-op Plan:   Post-operative Plan: Extubation in OR  Informed Consent: I have reviewed the  patients History and Physical, chart, labs and discussed the procedure including the risks, benefits and alternatives for the proposed anesthesia with the patient or authorized representative who has indicated his/her understanding and acceptance.   Dental advisory given  Plan Discussed with: CRNA  Anesthesia Plan Comments:         Anesthesia Quick Evaluation

## 2015-01-25 NOTE — Progress Notes (Signed)
  Subjective: Patient reports pain is about the same  Objective: Vital signs in last 24 hours: Temp:  [97.4 F (36.3 C)-98.9 F (37.2 C)] 97.4 F (36.3 C) (08/31 0355) Pulse Rate:  [68-94] 68 (08/31 0355) Resp:  [17-18] 18 (08/31 0355) BP: (114-125)/(50-88) 125/61 mmHg (08/31 0355) SpO2:  [92 %-98 %] 94 % (08/31 0355) Weight:  [92.6 kg (204 lb 2.3 oz)] 92.6 kg (204 lb 2.3 oz) (08/30 1900) Last BM Date: 01/24/15  Intake/Output from previous day: 08/30 0701 - 08/31 0700 In: 0  Out: 500 [Urine:500] Intake/Output this shift:    Abdomen soft with epigastric fullness, tenderness in epigastrium and RUQ  Lab Results:   Recent Labs  01/23/15 0259 01/25/15 0432  WBC 8.9 11.1*  HGB 12.6* 10.8*  HCT 38.3* 34.3*  PLT 131* 121*   BMET  Recent Labs  01/24/15 0700 01/25/15 0432  NA 135 138  K 4.5 4.5  CL 105 107  CO2 23 21*  GLUCOSE 135* 137*  BUN 25* 37*  CREATININE 2.11* 2.30*  CALCIUM 7.2* 7.1*   PT/INR No results for input(s): LABPROT, INR in the last 72 hours. ABG No results for input(s): PHART, HCO3 in the last 72 hours.  Invalid input(s): PCO2, PO2  Studies/Results: Dg Chest 2 View  01/23/2015   CLINICAL DATA:  Patient unable to stand  EXAM: CHEST  2 VIEW  COMPARISON:  09/16/2013  FINDINGS: Heart size is normal. There is mild asymmetric elevation of the right hemidiaphragm. Subsegmental atelectasis noted in the left base. No airspace consolidation.  IMPRESSION: 1. Asymmetric elevation of right hemidiaphragm. 2. Left base atelectasis.   Electronically Signed   By: Kerby Moors M.D.   On: 01/23/2015 11:12    Anti-infectives: Anti-infectives    None      Assessment/Plan: s/p * No surgery found *  Pancreatitis, suspected from gallstones  Creatinine worse today, bili up, WBC up.  Uncertain whether this is from the pancreatitis or possible cholecystitis.  Discussed proceeding with lap chole vs MRCP.   I discussed the risks of surgery. I discussed the  procedure in detail.   We discussed the risks and benefits of a laparoscopic cholecystectomy and cholangiogram including, but not limited to bleeding, infection, injury to surrounding structures such as the intestine or liver, bile leak, retained gallstones, need to convert to an open procedure, prolonged diarrhea, blood clots such as  DVT, common bile duct injury, anesthesia risks, and possible need for additional procedures. Plan will be to proceed to the OR today.  The patient and family are in agreement.  LOS: 3 days    Cathyrn Deas A 01/25/2015

## 2015-01-25 NOTE — Care Management Important Message (Signed)
Important Message  Patient Details  Name: Erik [name] MRN: [id number] Date of Birth: [birth year]   Medicare Important Message Given:  Yes-second notification given    Delorse Lek 01/25/2015, 2:27 PM

## 2015-01-25 NOTE — Anesthesia Postprocedure Evaluation (Signed)
Anesthesia Post Note  Patient: Erik [name]  Procedure(s) Performed: Procedure(s) (LRB): LAPAROSCOPIC CHOLECYSTECTOMY  (N/A)  Anesthesia type: General  Patient location: PACU  Post pain: Pain level controlled  Post assessment: Post-op Vital signs reviewed  Last Vitals: BP 132/69 mmHg  Pulse 120  Temp(Src) 36.7 C (Oral)  Resp 31  Ht 5\' 10"  (1.778 m)  Wt 204 lb 2.3 oz (92.6 kg)  BMI 29.29 kg/m2  SpO2 98%  Post vital signs: Reviewed  Level of consciousness: sedated  Complications: No apparent anesthesia complications

## 2015-01-25 NOTE — Anesthesia Procedure Notes (Signed)
Procedure Name: Intubation Date/Time: 01/25/2015 2:00 PM Performed by: Garrison [place] T Pre-anesthesia Checklist: Patient identified, Emergency Drugs available, Suction available and Patient being monitored Patient Re-evaluated:Patient Re-evaluated prior to inductionOxygen Delivery Method: Circle system utilized Preoxygenation: Pre-oxygenation with 100% oxygen Intubation Type: IV induction Ventilation: Mask ventilation without difficulty Laryngoscope Size: Mac and 3 Grade View: Grade I Tube type: Oral Tube size: 7.5 mm Number of attempts: 1 Airway Equipment and Method: Stylet Placement Confirmation: ETT inserted through vocal cords under direct vision,  positive ETCO2 and breath sounds checked- equal and bilateral Secured at: 23 cm Tube secured with: Tape Dental Injury: Teeth and Oropharynx as per pre-operative assessment

## 2015-01-26 ENCOUNTER — Inpatient Hospital Stay (HOSPITAL_COMMUNITY): Payer: Medicare Other

## 2015-01-26 ENCOUNTER — Encounter (HOSPITAL_COMMUNITY): Payer: Self-pay | Admitting: Surgery

## 2015-01-26 DIAGNOSIS — E1122 Type 2 diabetes mellitus with diabetic chronic kidney disease: Secondary | ICD-10-CM

## 2015-01-26 LAB — COMPREHENSIVE METABOLIC PANEL
ALBUMIN: 2.6 g/dL — AB (ref 3.5–5.0)
ALT: 32 U/L (ref 17–63)
AST: 57 U/L — AB (ref 15–41)
Alkaline Phosphatase: 50 U/L (ref 38–126)
Anion gap: 10 (ref 5–15)
BUN: 49 mg/dL — AB (ref 6–20)
CHLORIDE: 105 mmol/L (ref 101–111)
CO2: 21 mmol/L — AB (ref 22–32)
Calcium: 7.1 mg/dL — ABNORMAL LOW (ref 8.9–10.3)
Creatinine, Ser: 3.03 mg/dL — ABNORMAL HIGH (ref 0.61–1.24)
GFR calc Af Amer: 22 mL/min — ABNORMAL LOW (ref >60)
GFR calc non Af Amer: 19 mL/min — ABNORMAL LOW (ref >60)
GLUCOSE: 178 mg/dL — AB (ref 65–99)
POTASSIUM: 4.7 mmol/L (ref 3.5–5.1)
SODIUM: 136 mmol/L (ref 135–145)
Total Bilirubin: 1.6 mg/dL — ABNORMAL HIGH (ref 0.3–1.2)
Total Protein: 5.5 g/dL — ABNORMAL LOW (ref 6.5–8.1)

## 2015-01-26 LAB — GLUCOSE, CAPILLARY
GLUCOSE-CAPILLARY: 169 mg/dL — AB (ref 65–99)
GLUCOSE-CAPILLARY: 167 mg/dL — AB (ref 65–99)
GLUCOSE-CAPILLARY: 232 mg/dL — AB (ref 65–99)
GLUCOSE-CAPILLARY: 166 mg/dL — AB (ref 65–99)
Glucose-Capillary: 174 mg/dL — ABNORMAL HIGH (ref 65–99)
Glucose-Capillary: 195 mg/dL — ABNORMAL HIGH (ref 65–99)

## 2015-01-26 LAB — CBC
HCT: 34.6 % — ABNORMAL LOW (ref 39.0–52.0)
HEMATOCRIT: 34.1 % — AB (ref 39.0–52.0)
Hemoglobin: 10.7 g/dL — ABNORMAL LOW (ref 13.0–17.0)
Hemoglobin: 11 g/dL — ABNORMAL LOW (ref 13.0–17.0)
MCH: 27.3 pg (ref 26.0–34.0)
MCH: 28.2 pg (ref 26.0–34.0)
MCHC: 30.9 g/dL (ref 30.0–36.0)
MCHC: 32.3 g/dL (ref 30.0–36.0)
MCV: 87.4 fL (ref 78.0–100.0)
MCV: 88.3 fL (ref 78.0–100.0)
PLATELETS: 165 10*3/uL (ref 150–400)
PLATELETS: 129 10*3/uL — AB (ref 150–400)
RBC: 3.92 MIL/uL — AB (ref 4.22–5.81)
RBC: 3.9 MIL/uL — AB (ref 4.22–5.81)
RDW: 14.5 % (ref 11.5–15.5)
RDW: 14.2 % (ref 11.5–15.5)
WBC: 10.1 10*3/uL (ref 4.0–10.5)
WBC: 7.7 10*3/uL (ref 4.0–10.5)

## 2015-01-26 MED ORDER — INSULIN ASPART 100 UNIT/ML ~~LOC~~ SOLN
0.0000 [IU] | Freq: Three times a day (TID) | SUBCUTANEOUS | Status: DC
  Administered 2015-01-26: 3 [IU] via SUBCUTANEOUS
  Administered 2015-01-26 – 2015-01-27 (×2): 2 [IU] via SUBCUTANEOUS
  Administered 2015-01-27: 3 [IU] via SUBCUTANEOUS
  Administered 2015-01-28 (×3): 2 [IU] via SUBCUTANEOUS
  Administered 2015-01-29: 1 [IU] via SUBCUTANEOUS

## 2015-01-26 MED ORDER — IPRATROPIUM-ALBUTEROL 0.5-2.5 (3) MG/3ML IN SOLN
3.0000 mL | Freq: Four times a day (QID) | RESPIRATORY_TRACT | Status: DC
  Administered 2015-01-26 – 2015-01-27 (×5): 3 mL via RESPIRATORY_TRACT
  Filled 2015-01-26 (×7): qty 3

## 2015-01-26 NOTE — Progress Notes (Signed)
Patient ID: Erik [name], male   DOB: 02-20-1941, 74 y.o.   MRN: [id number] 1 Day Post-Op  Subjective: Pt feels better today, but still with same preoperative pain.  Objective: Vital signs in last 24 hours: Temp:  [97.5 F (36.4 C)-98.7 F (37.1 C)] 97.6 F (36.4 C) (09/01 0733) Pulse Rate:  [70-129] 83 (09/01 0733) Resp:  [16-31] 18 (09/01 0733) BP: (98-154)/(49-74) 113/57 mmHg (09/01 0733) SpO2:  [93 %-99 %] 97 % (09/01 0733) Weight:  [92.7 kg (204 lb 5.9 oz)] 92.7 kg (204 lb 5.9 oz) (08/31 2012) Last BM Date: 01/24/15  Intake/Output from previous day: 08/31 0701 - 09/01 0700 In: 1075 [P.O.:360; I.V.:715] Out: 200 [Urine:200] Intake/Output this shift: Total I/O In: -  Out: 100 [Urine:100]  PE: Abd: soft, some distention, +BS, pain still greatest in LUQ, but with some appropriate post surgical pain as well. Lungs: Few crackles, no overt wheezing  Lab Results:   Recent Labs  01/25/15 2359 01/26/15 0644  WBC 7.7 10.1  HGB 11.0* 10.7*  HCT 34.1* 34.6*  PLT 129* 165   BMET  Recent Labs  01/25/15 0432 01/26/15 0644  NA 138 136  K 4.5 4.7  CL 107 105  CO2 21* 21*  GLUCOSE 137* 178*  BUN 37* 49*  CREATININE 2.30* 3.03*  CALCIUM 7.1* 7.1*   PT/INR No results for input(s): LABPROT, INR in the last 72 hours. CMP     Component Value Date/Time   NA 136 01/26/2015 0644   K 4.7 01/26/2015 0644   CL 105 01/26/2015 0644   CO2 21* 01/26/2015 0644   GLUCOSE 178* 01/26/2015 0644   BUN 49* 01/26/2015 0644   CREATININE 3.03* 01/26/2015 0644   CREATININE 1.79* 10/14/2014 1644   CALCIUM 7.1* 01/26/2015 0644   PROT 5.5* 01/26/2015 0644   ALBUMIN 2.6* 01/26/2015 0644   AST 57* 01/26/2015 0644   ALT 32 01/26/2015 0644   ALKPHOS 50 01/26/2015 0644   BILITOT 1.6* 01/26/2015 0644   GFRNONAA 19* 01/26/2015 0644   GFRAA 22* 01/26/2015 0644   Lipase     Component Value Date/Time   LIPASE 86* 01/24/2015 0700       Studies/Results: No results  found.  Anti-infectives: Anti-infectives    None       Assessment/Plan  POD 1, s/p lap chole, no IOC, for gallstone pancreatitis -patient clearly still had pancreatitis during his surgery yesterday.  His colon was dilated.  Unable to perform a cholangiogram.  TB up to 1.6 today, but alk phos remains normal.  Will recheck tomorrow morning to determine if he needs MRCP -clear liquids only today.  Would not advance given continued pancreatitis -mobilize and pulm toilet ARF -still making urine.  Cr up to 3 today.  Feel this still may be under resuscitation from pancreatitis.  Would probably recommend more fluids, but patient does have some crackles on exam to suggest he already may have some fluid overload.  D/w Dr. Johnette [name], primary service.  He is going to obtain a CXR and then make a determination on how aggressive to be with his fluids.  He remains on 100cc NS right now through his PIV.   LOS: 4 days    Erik [name] E 01/26/2015, 8:23 AM Pager: [phone]

## 2015-01-26 NOTE — Evaluation (Signed)
Physical Therapy Evaluation Patient Details Name: Erik [name] MRN: [id number] DOB: May 16, 1941 Today's Date: 01/26/2015   History of Present Illness  74 year old Caucasian male with a past medical history of coronary artery disease, diabetes type 2, atrial fibrillation on anticoagulation, chronic kidney disease, presented with abdominal pain and back pain. Was noted to have acute pancreatitis. Thought to be secondary to gallstones,lap cholecystectomy on 8/31 by Dr. [name], finding suggestive of acute cholecystitis  Clinical Impression  Pt presents with the below listed impairments (see PT problem list) and will benefit from skilled PT intervention to address these impairments and increase functional independence with mobility prior to d/c home with wife providing supervision assist. Pt overall steady A without AD on evaluation. Pt limited due to wheezing this session - awaiting breathing treatment (O2 sats >91% with activity). Will benefit from further balance and gait training as well as strengthening to prepare for d/c home.     Follow Up Recommendations Home health PT;Supervision - Intermittent    Equipment Recommendations  None recommended by PT    Recommendations for Other Services       Precautions / Restrictions Restrictions Weight Bearing Restrictions: No      Mobility  Bed Mobility               General bed mobility comments: Pt up in chair already  Transfers Overall transfer level: Needs assistance Equipment used: None Transfers: Sit to/from Stand Sit to Stand: Supervision;Min guard         General transfer comment: Min guard initially but progressed to close S for sit to stand using armrests for support  Ambulation/Gait Ambulation/Gait assistance: Min guard;Min assist Ambulation Distance (Feet): 120 Feet Assistive device:  (IV pole for part of gait) Gait Pattern/deviations: Step-through pattern;Decreased stride length;Trunk flexed   Gait velocity  interpretation: Below normal speed for age/gender General Gait Details: steady A for balance and safety but no LOB occurred and pt reports he feels weaker but close to his baseline  Stairs            Wheelchair Mobility    Modified Rankin (Stroke Patients Only)       Balance Overall balance assessment: Needs assistance Sitting-balance support: No upper extremity supported;Feet supported Sitting balance-Leahy Scale: Good     Standing balance support: Single extremity supported;No upper extremity supported;During functional activity Standing balance-Leahy Scale: Good                               Pertinent Vitals/Pain Pain Assessment: 0-10 Pain Score: 6  Pain Location: abdomen Pain Descriptors / Indicators: Aching Pain Intervention(s): Monitored during session;Limited activity within patient's tolerance;Repositioned    Home Living Family/patient expects to be discharged to:: Private residence Living Arrangements: Spouse/significant other Available Help at Discharge: Family;Available 24 hours/day Type of Home: House Home Access: Level entry     Home Layout: One level Home Equipment: None      Prior Function Level of Independence: Independent         Comments: Pt was mowing the yard, driving, and active PTA without AD.     Hand Dominance        Extremity/Trunk Assessment   Upper Extremity Assessment: Overall WFL for tasks assessed           Lower Extremity Assessment: Generalized weakness      Cervical / Trunk Assessment: Kyphotic  Communication   Communication: HOH  Cognition Arousal/Alertness: Awake/alert Behavior During Therapy: [facility]  for tasks assessed/performed Overall Cognitive Status: Within Functional Limits for tasks assessed                      General Comments      Exercises        Assessment/Plan    PT Assessment Patient needs continued PT services  PT Diagnosis Difficulty walking;Generalized  weakness;Acute pain   PT Problem List Decreased strength;Decreased activity tolerance;Decreased balance;Decreased mobility;Cardiopulmonary status limiting activity;Pain  PT Treatment Interventions DME instruction;Gait training;Stair training;Functional mobility training;Therapeutic activities;Therapeutic exercise;Balance training;Patient/family education   PT Goals (Current goals can be found in the Care Plan section) Acute Rehab PT Goals Patient Stated Goal: get this wheezing better PT Goal Formulation: With patient/family Time For Goal Achievement: 02/09/15 Potential to Achieve Goals: Good    Frequency Min 3X/week   Barriers to discharge        Co-evaluation               End of Session Equipment Utilized During Treatment: Gait belt Activity Tolerance: Other (comment);Patient tolerated treatment well (Pt awaiting respiratory treatment - wheezing) Patient left: in chair;with nursing/sitter in room;with family/visitor present;with call bell/phone within reach Nurse Communication: Mobility status;Other (comment) (notify respiratory awaiting treatment)         Time: [id number] PT Time Calculation (min) (ACUTE ONLY): 11 min   Charges:   PT Evaluation $Initial PT Evaluation Tier I: 1 Procedure     PT G Codes:        Juanna Cao, PT, DPT Pager #: [phone]  01/26/2015, 3:31 PM

## 2015-01-26 NOTE — Care Management Note (Signed)
Case Management Note  Patient Details  Name: Erik [name] MRN: [id number] Date of Birth: Mar 09, 1941  Subjective/Objective:     CM following for progression and d/c planning.               Action/Plan: 01/26/15 postop day one , following will arrange Cool needs as identified.   Expected Discharge Date:       01/29/2015           Expected Discharge Plan:  Home/Self Care  In-House Referral:  NA  Discharge planning Services  CM Consult  Post Acute Care Choice:    Choice offered to:     DME Arranged:    DME Agency:     HH Arranged:    HH Agency:     Status of Service:  In process, will continue to follow  Medicare Important Message Given:  Yes-second notification given Date Medicare IM Given:    Medicare IM give by:    Date Additional Medicare IM Given:    Additional Medicare Important Message give by:     If discussed at [place] of Stay Meetings, dates discussed:    Additional Comments:  Adron Bene, RN 01/26/2015, 1:14 PM

## 2015-01-26 NOTE — Progress Notes (Signed)
Patient Demographics  Erik [name], is a 74 y.o. male, DOB - Jun 15, 1940, [id number]  Admit date - 01/22/2015   Admitting Physician [name], MD  Outpatient Primary MD for the patient is [name], MD  LOS - 4   Chief Complaint  Patient presents with  . Abdominal Pain  . Back Pain       Admission HPI/Brief narrative: 74 year old Caucasian male with a past medical history of coronary artery disease, diabetes type 2, atrial fibrillation on anticoagulation, chronic kidney disease, presented with abdominal pain and back pain. Was noted to have acute pancreatitis. Thought to be secondary to gallstones,lap cholecystectomy on 8/31 by Dr. [name], finding suggestive of acute cholecystitis.  Subjective:   Erik [name] today has, No headache, No chest pain, reports mild abdominal pain, - No Nausea, No new weakness tingling or numbness, No Cough - SOB. Passing flatus.  Assessment & Plan    Principal Problem:   Acute pancreatitis Active Problems:   Diabetes mellitus   CKD (chronic kidney disease), stage III   Atrial fibrillation   NICM (nonischemic cardiomyopathy)   Hypertension   GERD (gastroesophageal reflux disease)   Acute biliary pancreatitis   Acute on chronic renal failure   Acute biliary pancreatitis - Patient underwent laparoscopic cholecystectomy on 8/31, still have evidence of pancreatitis on surgery yesterday,: Was dilated, unfortunately cholangiogram could not be performed, and is to check LFTs in a.m., if total bili continues to rise, will need MRCP. - Tolerating clear liquid diet. - Continue with IV fluids. - Patient was also recently started on Tradjenta which can also cause pancreatitis. Continue current management. - Patient does have a history of coronary artery disease. Had a low risk stress test last year. Echocardiogram as above. He should be able to proceed  to surgery without further testing. His x-ray did not show any edema.   Atrial fibrillation with RVR - On 8/29 he was noted to be in rapid ventricular response. He was given multiple doses of metoprolol intravenously and then started on Cardizem orally.  - Heart rate is better controlled, will resume him back on his oral dose metoprolol, DC IV metoprolol and oral Cardizem. - Holding his oral anticoagulation. No need for intravenous heparin per cardiology. Hopefully can be resumed back on Xarelto by tomorrow no further intervention is planned by surgery.  Acute on chronic kidney disease stage III - Baseline creatinine 1.7- 2, creatinine continued to increase gradually, he attended 3.03 today, IV fluids been increased 100 mL/h, continue at current rate, will monitor closely as patient started to have evidence of mild volume overload. - We'll check bladder scan.  Diastolic CHF - 2-D echo done earlier this admission showing grade 1 diastolic dysfunction. - Patient is on IV fluids, started to develop picture of volume overload, will continue to monitor clinically  History of diabetes mellitus type 2 Continue Lantus and sliding scale coverage. Monitor CBGs closely. Holding oral agents. CBGs are well controlled.  History of essential hypertension Stable. Blood pressure is reasonably well controlled.   Code Status: Full  Family Communication: Son at bedside  Disposition Plan: Pending further workup   Procedures  Status post laparoscopic cholecystectomy 8/31 by Dr. [name]   Consults   Gen. surgery Cardiology  Medications  Scheduled Meds: . diltiazem  60 mg Oral Q6H  . insulin aspart  0-9 Units Subcutaneous TID WC  . insulin glargine  15 Units Subcutaneous QHS  . metoprolol  5 mg Intravenous 4 times per day  . pantoprazole (PROTONIX) IV  40 mg Intravenous Q24H  . sodium chloride  3 mL Intravenous Q12H   Continuous Infusions: . sodium chloride 100 mL/hr at 01/25/15 2251   PRN  Meds:.acetaminophen **OR** acetaminophen, artificial tears, HYDROmorphone (DILAUDID) injection, iohexol, LORazepam, ondansetron **OR** ondansetron (ZOFRAN) IV, oxyCODONE  DVT Prophylaxis   SCDs, will resume Xarelto in 24 hours  Lab Results  Component Value Date   PLT 165 01/26/2015    Antibiotics   Anti-infectives    None          Objective:   Filed Vitals:   01/25/15 1819 01/25/15 2012 01/26/15 0437 01/26/15 0733  BP: 153/74 139/57 103/49 113/57  Pulse: 97 129 91 83  Temp: 97.7 F (36.5 C) 98.7 F (37.1 C) 97.5 F (36.4 C) 97.6 F (36.4 C)  TempSrc: Oral Oral Oral Oral  Resp:  17 18 18   Height:      Weight:  92.7 kg (204 lb 5.9 oz)    SpO2: 98% 97% 97% 97%    Wt Readings from Last 3 Encounters:  01/25/15 92.7 kg (204 lb 5.9 oz)  10/14/14 88.905 kg (196 lb)  04/15/14 88.451 kg (195 lb)     Intake/Output Summary (Last 24 hours) at 01/26/15 1153 Last data filed at 01/26/15 1014  Gross per 24 hour  Intake   1315 ml  Output    250 ml  Net   1065 ml     Physical Exam  Awake Alert, Oriented X 3, No new F.N deficits, Normal affect [place].AT,PERRAL Supple Neck, No cervical lymphadenopathy appriciated.  Symmetrical Chest wall movement, Good air movement bilaterally, scattered wheezing , bibasilar crackles. RRR,No Gallops,Rubs or new Murmurs, No Parasternal Heave +ve B.Sounds, laparoscopic surgical scar with no wheezing or discharge, mild distention, mild tenderness, No rigidity. No Cyanosis, Clubbing or edema, No new Rash or bruise    Data Review   Micro Results No results found for this or any previous visit (from the past 240 hour(s)).  Radiology Reports Dg Chest 2 View  01/23/2015   CLINICAL DATA:  Patient unable to stand  EXAM: CHEST  2 VIEW  COMPARISON:  09/16/2013  FINDINGS: Heart size is normal. There is mild asymmetric elevation of the right hemidiaphragm. Subsegmental atelectasis noted in the left base. No airspace consolidation.  IMPRESSION: 1.  Asymmetric elevation of right hemidiaphragm. 2. Left base atelectasis.   Electronically Signed   By: Kerby Moors M.D.   On: 01/23/2015 11:12   Ct Abdomen Pelvis W Contrast  01/22/2015   CLINICAL DATA:  Epigastric pain and right lower quadrant pain radiating to back.  EXAM: CT ABDOMEN AND PELVIS WITH CONTRAST  TECHNIQUE: Multidetector CT imaging of the abdomen and pelvis was performed using the standard protocol following bolus administration of intravenous contrast.  CONTRAST:  49mL OMNIPAQUE IOHEXOL 300 MG/ML  SOLN  COMPARISON:  None.  FINDINGS: Lung bases are within normal. There is calcified plaque over the left anterior descending coronary artery.  Abdominal images demonstrate mild cholelithiasis. The liver, spleen and adrenal glands are normal. Appendix is normal. Small bowel and colon are within normal.  There is moderate peripancreatic fluid as well as fluid over the right pericolic gutter. Findings are compatible with acute pancreatitis. No significant complicating features.  A  horseshoe kidney is present. There is no hydronephrosis or nephrolithiasis 3.1 cm simple renal cyst over the right-sided component of the horseshoe kidney. Ureters are normal.  Mild calcified plaque over the abdominal aorta and iliac arteries.  Pelvic images demonstrate mild prominence of the prostate gland as the bladder and rectum are otherwise within normal.  There are moderate degenerative changes of the spine with prominent anterior osteophytes. Mild degenerate change of the hips.  IMPRESSION: Evidence of acute pancreatitis likely related to patient's gallbladder stone disease. No complicating features.  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 3.1 cm cyst over the right-sided component of the horseshoe kidney.  Mild prostatic enlargement.  Atherosclerotic coronary artery disease.   Electronically Signed   By: Marin Olp M.D.   On: 01/22/2015 14:43   Dg Chest Port 1 View  01/26/2015   CLINICAL DATA:   Dyspnea.  EXAM: PORTABLE CHEST - 1 VIEW  COMPARISON:  01/23/2015 chest radiograph.  FINDINGS: Lung volumes are low. Cardiomediastinal silhouette appears stable with top normal heart size accounting for low lung volumes and portable technique. No pneumothorax. No pleural effusion. Stable mild elevation of the right hemidiaphragm. There is vascular crowding due to the low lung volumes, with no focal lung opacity and no overt pulmonary edema. Partially visualized is left shoulder hemiarthroplasty.  IMPRESSION: Limited hypoinspiratory portable chest radiograph. No appreciable active cardiopulmonary disease.   Electronically Signed   By: Ilona Sorrel M.D.   On: 01/26/2015 09:41     CBC  Recent Labs Lab 01/22/15 1155 01/23/15 0259 01/25/15 0432 01/25/15 2359 01/26/15 0644  WBC 8.1 8.9 11.1* 7.7 10.1  HGB 12.4* 12.6* 10.8* 11.0* 10.7*  HCT 37.2* 38.3* 34.3* 34.1* 34.6*  PLT 125* 131* 121* 129* 165  MCV 85.1 84.9 87.5 87.4 88.3  MCH 28.4 27.9 27.6 28.2 27.3  MCHC 33.3 32.9 31.5 32.3 30.9  RDW 13.2 13.4 14.1 14.2 14.5    Chemistries   Recent Labs Lab 01/22/15 1155 01/23/15 0259 01/24/15 0700 01/25/15 0432 01/26/15 0644  NA 139 140 135 138 136  K 4.3 4.8 4.5 4.5 4.7  CL 107 106 105 107 105  CO2 26 28 23  21* 21*  GLUCOSE 287* 162* 135* 137* 178*  BUN 15 16 25* 37* 49*  CREATININE 1.75* 1.59* 2.11* 2.30* 3.03*  CALCIUM 8.7* 8.2* 7.2* 7.1* 7.1*  AST 47* 50* 25 22 57*  ALT 26 43 26 20 32  ALKPHOS 73 70 52 55 50  BILITOT 1.2 1.1 1.3* 1.5* 1.6*   ------------------------------------------------------------------------------------------------------------------ estimated creatinine clearance is 23.7 mL/min (by C-G formula based on Cr of 3.03). ------------------------------------------------------------------------------------------------------------------ No results for input(s): HGBA1C in the last 72  hours. ------------------------------------------------------------------------------------------------------------------ No results for input(s): CHOL, HDL, LDLCALC, TRIG, CHOLHDL, LDLDIRECT in the last 72 hours. ------------------------------------------------------------------------------------------------------------------ No results for input(s): TSH, T4TOTAL, T3FREE, THYROIDAB in the last 72 hours.  Invalid input(s): FREET3 ------------------------------------------------------------------------------------------------------------------ No results for input(s): VITAMINB12, FOLATE, FERRITIN, TIBC, IRON, RETICCTPCT in the last 72 hours.  Coagulation profile No results for input(s): INR, PROTIME in the last 168 hours.  No results for input(s): DDIMER in the last 72 hours.  Cardiac Enzymes No results for input(s): CKMB, TROPONINI, MYOGLOBIN in the last 168 hours.  Invalid input(s): CK ------------------------------------------------------------------------------------------------------------------ Invalid input(s): POCBNP     Time Spent in minutes   35 minutes   Daylah Sayavong M.D on 01/26/2015 at 11:53 AM  Between 7am to 7pm - Pager - [phone]  After 7pm go to [email or web address] - password [facility]  [facility]  Office  [phone]

## 2015-01-27 LAB — CBC
HEMATOCRIT: 29.1 % — AB (ref 39.0–52.0)
HEMOGLOBIN: 9.6 g/dL — AB (ref 13.0–17.0)
MCH: 28.4 pg (ref 26.0–34.0)
MCHC: 33 g/dL (ref 30.0–36.0)
MCV: 86.1 fL (ref 78.0–100.0)
Platelets: 151 10*3/uL (ref 150–400)
RBC: 3.38 MIL/uL — AB (ref 4.22–5.81)
RDW: 14.2 % (ref 11.5–15.5)
WBC: 7.5 10*3/uL (ref 4.0–10.5)

## 2015-01-27 LAB — GLUCOSE, CAPILLARY
GLUCOSE-CAPILLARY: 181 mg/dL — AB (ref 65–99)
GLUCOSE-CAPILLARY: 231 mg/dL — AB (ref 65–99)
Glucose-Capillary: 172 mg/dL — ABNORMAL HIGH (ref 65–99)
Glucose-Capillary: 247 mg/dL — ABNORMAL HIGH (ref 65–99)

## 2015-01-27 LAB — COMPREHENSIVE METABOLIC PANEL
ALT: 23 U/L (ref 17–63)
ANION GAP: 9 (ref 5–15)
AST: 39 U/L (ref 15–41)
Albumin: 2.4 g/dL — ABNORMAL LOW (ref 3.5–5.0)
Alkaline Phosphatase: 52 U/L (ref 38–126)
BUN: 58 mg/dL — ABNORMAL HIGH (ref 6–20)
CHLORIDE: 106 mmol/L (ref 101–111)
CO2: 21 mmol/L — AB (ref 22–32)
Calcium: 7.1 mg/dL — ABNORMAL LOW (ref 8.9–10.3)
Creatinine, Ser: 3.05 mg/dL — ABNORMAL HIGH (ref 0.61–1.24)
GFR calc non Af Amer: 18 mL/min — ABNORMAL LOW (ref >60)
GFR, EST AFRICAN AMERICAN: 21 mL/min — AB (ref >60)
Glucose, Bld: 162 mg/dL — ABNORMAL HIGH (ref 65–99)
POTASSIUM: 3.9 mmol/L (ref 3.5–5.1)
SODIUM: 136 mmol/L (ref 135–145)
Total Bilirubin: 1.4 mg/dL — ABNORMAL HIGH (ref 0.3–1.2)
Total Protein: 5.1 g/dL — ABNORMAL LOW (ref 6.5–8.1)

## 2015-01-27 MED ORDER — METOPROLOL TARTRATE 50 MG PO TABS: 75.0000 mg | ORAL_TABLET | Freq: Two times a day (BID) | ORAL | Status: DC

## 2015-01-27 MED ORDER — DILTIAZEM HCL 25 MG/5ML IV SOLN
10.0000 mg | Freq: Once | INTRAVENOUS | Status: AC
  Administered 2015-01-27: 10 mg via INTRAVENOUS
  Filled 2015-01-27: qty 5

## 2015-01-27 MED ORDER — METOPROLOL TARTRATE 50 MG PO TABS
75.0000 mg | ORAL_TABLET | Freq: Two times a day (BID) | ORAL | Status: DC
  Administered 2015-01-27 – 2015-01-29 (×5): 75 mg via ORAL
  Filled 2015-01-27 (×10): qty 1

## 2015-01-27 MED ORDER — DILTIAZEM HCL 30 MG PO TABS
30.0000 mg | ORAL_TABLET | Freq: Four times a day (QID) | ORAL | Status: DC
  Administered 2015-01-27 – 2015-01-29 (×7): 30 mg via ORAL
  Filled 2015-01-27 (×7): qty 1

## 2015-01-27 MED ORDER — RIVAROXABAN 15 MG PO TABS
15.0000 mg | ORAL_TABLET | Freq: Every day | ORAL | Status: DC
  Administered 2015-01-27 – 2015-01-28 (×2): 15 mg via ORAL
  Filled 2015-01-27 (×2): qty 1

## 2015-01-27 MED ORDER — IPRATROPIUM-ALBUTEROL 0.5-2.5 (3) MG/3ML IN SOLN
3.0000 mL | RESPIRATORY_TRACT | Status: DC | PRN
Start: 2015-01-27 — End: 2015-01-29
  Administered 2015-01-28: 3 mL via RESPIRATORY_TRACT
  Filled 2015-01-27: qty 3

## 2015-01-27 MED ORDER — INSULIN GLARGINE 100 UNIT/ML ~~LOC~~ SOLN
18.0000 [IU] | Freq: Every day | SUBCUTANEOUS | Status: DC
  Administered 2015-01-27 – 2015-01-28 (×2): 18 [IU] via SUBCUTANEOUS
  Filled 2015-01-27 (×3): qty 0.18

## 2015-01-27 NOTE — Progress Notes (Signed)
Patient ID: Erik [name], male   DOB: 21-Sep-1940, 74 y.o.   MRN: [id number] 2 Days Post-Op  Subjective: Pt continues to feel better. Most of his pain now is postoperative pain.  Tolerating clear liquids with no issues.  Passing flatus, and a very small BM  Objective: Vital signs in last 24 hours: Temp:  [98.2 F (36.8 C)-100 F (37.8 C)] 99.5 F (37.5 C) (09/02 0805) Pulse Rate:  [94-149] 149 (09/02 0805) Resp:  [17-18] 18 (09/02 0805) BP: (131-154)/(50-70) 145/70 mmHg (09/02 0805) SpO2:  [95 %-99 %] 95 % (09/02 0805) Weight:  [92.5 kg (203 lb 14.8 oz)] 92.5 kg (203 lb 14.8 oz) (09/01 2051) Last BM Date: 01/26/15  Intake/Output from previous day: 09/01 0701 - 09/02 0700 In: 4040 [P.O.:1640; I.V.:2400] Out: 1009 [Urine:1009] Intake/Output this shift: Total I/O In: -  Out: 100 [Urine:100]  PE: Abd: soft, but still feels distended, +BS, appropriately tender in RUQ, incisions c/d/i  Lab Results:   Recent Labs  01/26/15 0644 01/27/15 0447  WBC 10.1 7.5  HGB 10.7* 9.6*  HCT 34.6* 29.1*  PLT 165 151   BMET  Recent Labs  01/26/15 0644 01/27/15 0447  NA 136 136  K 4.7 3.9  CL 105 106  CO2 21* 21*  GLUCOSE 178* 162*  BUN 49* 58*  CREATININE 3.03* 3.05*  CALCIUM 7.1* 7.1*   PT/INR No results for input(s): LABPROT, INR in the last 72 hours. CMP     Component Value Date/Time   NA 136 01/27/2015 0447   K 3.9 01/27/2015 0447   CL 106 01/27/2015 0447   CO2 21* 01/27/2015 0447   GLUCOSE 162* 01/27/2015 0447   BUN 58* 01/27/2015 0447   CREATININE 3.05* 01/27/2015 0447   CREATININE 1.79* 10/14/2014 1644   CALCIUM 7.1* 01/27/2015 0447   PROT 5.1* 01/27/2015 0447   ALBUMIN 2.4* 01/27/2015 0447   AST 39 01/27/2015 0447   ALT 23 01/27/2015 0447   ALKPHOS 52 01/27/2015 0447   BILITOT 1.4* 01/27/2015 0447   GFRNONAA 18* 01/27/2015 0447   GFRAA 21* 01/27/2015 0447   Lipase     Component Value Date/Time   LIPASE 86* 01/24/2015 0700        Studies/Results: Dg Chest Port 1 View  01/26/2015   CLINICAL DATA:  Dyspnea.  EXAM: PORTABLE CHEST - 1 VIEW  COMPARISON:  01/23/2015 chest radiograph.  FINDINGS: Lung volumes are low. Cardiomediastinal silhouette appears stable with top normal heart size accounting for low lung volumes and portable technique. No pneumothorax. No pleural effusion. Stable mild elevation of the right hemidiaphragm. There is vascular crowding due to the low lung volumes, with no focal lung opacity and no overt pulmonary edema. Partially visualized is left shoulder hemiarthroplasty.  IMPRESSION: Limited hypoinspiratory portable chest radiograph. No appreciable active cardiopulmonary disease.   Electronically Signed   By: Ilona Sorrel M.D.   On: 01/26/2015 09:41    Anti-infectives: Anti-infectives    None       Assessment/Plan  POD 2, s/p lap chole for gallstone pancreatitis -advance to full liquids -ok to resume xarelto from our standpoint -TB 1.4 and other LFTs normal.  Would hold on MRCP at this point as patient is improving -cont to mobilize and pulm toilet ARI -per primary service   LOS: 5 days    Patty Lopezgarcia E 01/27/2015, 9:07 AM Pager: [phone]

## 2015-01-27 NOTE — Progress Notes (Addendum)
Patient Demographics  Erik [name], is a 74 y.o. male, DOB - [birth year], [id number]  Admit date - 01/22/2015   Admitting Physician [name], MD  Outpatient Primary MD for the patient is [name], MD  LOS - 5   Chief Complaint  Patient presents with  . Abdominal Pain  . Back Pain       Admission HPI/Brief narrative: 74 year old Caucasian male with a past medical history of coronary artery disease, diabetes type 2, atrial fibrillation on anticoagulation, chronic kidney disease, presented with abdominal pain and back pain. Was noted to have acute pancreatitis. Thought to be secondary to gallstones,lap cholecystectomy on 8/31 by Dr. [name], finding suggestive of acute cholecystitis.  Subjective:   Erik [name] today has, No headache, No chest pain, reports mild abdominal pain, - No Nausea, No new weakness tingling or numbness, No Cough - SOB. Passing flatus.  Assessment & Plan    Principal Problem:   Acute pancreatitis Active Problems:   Diabetes mellitus   CKD (chronic kidney disease), stage III   Atrial fibrillation   NICM (nonischemic cardiomyopathy)   Hypertension   GERD (gastroesophageal reflux disease)   Acute biliary pancreatitis   Acute on chronic renal failure   Acute biliary pancreatitis - Patient underwent laparoscopic cholecystectomy on 8/31, still have evidence of pancreatitis on surgery yesterday,Colon Was dilated, unfortunately cholangiogram could not be performed, no indication for MRCP given the stable LFTs as per surgery. - Advanced to full liquid diet today which is tolerating very well - Continue with IV fluids. - Patient was also recently started on Tradjenta which can also cause pancreatitis. Continue current management. - Patient does have a history of coronary artery disease. Had a low risk stress test last year. Echocardiogram as above. He  should be able to proceed to surgery without further testing. His x-ray did not show any edema.   Atrial fibrillation with RVR - On 8/29 he was noted to be in rapid ventricular response. He was given multiple doses of metoprolol intravenously and then started on Cardizem orally. Later resumed him back on his oral dose metoprolol, DC IV metoprolol and oral Cardizem. - Patient again is in A. fib with RVR today, will avoid increasing his oral metoprolol given his wheezing, will start him on Cardizem, start on telemetry monitor. -Resumed on Xarelto today as okay by surgery.  Acute on chronic kidney disease stage III - Baseline creatinine 1.7- 2, creatinine continued to increase gradually , creatinine today is 3,  continue with IV fluids, will monitor closely as patient started to have evidence of mild volume overload.   Diastolic CHF - 2-D echo done earlier this admission showing grade 1 diastolic dysfunction. - Patient is on IV fluids, continue to monitor closely.  History of diabetes mellitus type 2 - on sliding scale coverage. Will increase Lantus this p.m. as having some elevated CBGs . Holding oral agents.   History of essential hypertension Stable. Blood pressure is reasonably well controlled.   Code Status: Full  Family Communication: Son at bedside  Disposition Plan: Pending further workup   Procedures  Status post laparoscopic cholecystectomy 8/31 by Dr. [name]   Consults   Gen. surgery Cardiology  Medications  Scheduled Meds: . insulin aspart  0-9 Units  Subcutaneous TID WC  . insulin glargine  15 Units Subcutaneous QHS  . ipratropium-albuterol  3 mL Nebulization Q6H  . metoprolol  75 mg Oral BID  . pantoprazole (PROTONIX) IV  40 mg Intravenous Q24H  . rivaroxaban  15 mg Oral Q supper  . sodium chloride  3 mL Intravenous Q12H   Continuous Infusions: . sodium chloride 100 mL/hr at 01/25/15 2251   PRN Meds:.acetaminophen **OR** acetaminophen, artificial tears,  iohexol, LORazepam, ondansetron **OR** ondansetron (ZOFRAN) IV, oxyCODONE  DVT Prophylaxis   SCDs, /xarelto  Lab Results  Component Value Date   PLT 151 01/27/2015    Antibiotics   Anti-infectives    None          Objective:   Filed Vitals:   01/26/15 2051 01/27/15 0102 01/27/15 0449 01/27/15 0805  BP: 131/53  154/60 145/70  Pulse: 116  121 149  Temp: 100 F (37.8 C)  99.2 F (37.3 C) 99.5 F (37.5 C)  TempSrc: Oral  Oral Oral  Resp: 18  17 18   Height:      Weight: 92.5 kg (203 lb 14.8 oz)     SpO2: 96% 96% 95% 95%    Wt Readings from Last 3 Encounters:  01/26/15 92.5 kg (203 lb 14.8 oz)  10/14/14 88.905 kg (196 lb)  04/15/14 88.451 kg (195 lb)     Intake/Output Summary (Last 24 hours) at 01/27/15 1406 Last data filed at 01/27/15 1341  Gross per 24 hour  Intake   4280 ml  Output   1025 ml  Net   3255 ml     Physical Exam  Awake Alert, Oriented X 3, No new F.N deficits, Normal affect [place].AT,PERRAL Supple Neck, No cervical lymphadenopathy appriciated.  Symmetrical Chest wall movement, Good air movement bilaterally, still having scattered wheezing, no further bibasilar crackles. Tachycardic, irregular,No Gallops,Rubs or new Murmurs, No Parasternal Heave +ve B.Sounds, laparoscopic surgical scar with no wheezing or discharge, mild distention, mild tenderness, No rigidity. No Cyanosis, Clubbing , mild pedal edema.    Data Review   Micro Results No results found for this or any previous visit (from the past 240 hour(s)).  Radiology Reports Dg Chest 2 View  01/23/2015   CLINICAL DATA:  Patient unable to stand  EXAM: CHEST  2 VIEW  COMPARISON:  09/16/2013  FINDINGS: Heart size is normal. There is mild asymmetric elevation of the right hemidiaphragm. Subsegmental atelectasis noted in the left base. No airspace consolidation.  IMPRESSION: 1. Asymmetric elevation of right hemidiaphragm. 2. Left base atelectasis.   Electronically Signed   By: Kerby Moors  M.D.   On: 01/23/2015 11:12   Ct Abdomen Pelvis W Contrast  01/22/2015   CLINICAL DATA:  Epigastric pain and right lower quadrant pain radiating to back.  EXAM: CT ABDOMEN AND PELVIS WITH CONTRAST  TECHNIQUE: Multidetector CT imaging of the abdomen and pelvis was performed using the standard protocol following bolus administration of intravenous contrast.  CONTRAST:  72mL OMNIPAQUE IOHEXOL 300 MG/ML  SOLN  COMPARISON:  None.  FINDINGS: Lung bases are within normal. There is calcified plaque over the left anterior descending coronary artery.  Abdominal images demonstrate mild cholelithiasis. The liver, spleen and adrenal glands are normal. Appendix is normal. Small bowel and colon are within normal.  There is moderate peripancreatic fluid as well as fluid over the right pericolic gutter. Findings are compatible with acute pancreatitis. No significant complicating features.  A horseshoe kidney is present. There is no hydronephrosis or nephrolithiasis 3.1 cm simple  renal cyst over the right-sided component of the horseshoe kidney. Ureters are normal.  Mild calcified plaque over the abdominal aorta and iliac arteries.  Pelvic images demonstrate mild prominence of the prostate gland as the bladder and rectum are otherwise within normal.  There are moderate degenerative changes of the spine with prominent anterior osteophytes. Mild degenerate change of the hips.  IMPRESSION: Evidence of acute pancreatitis likely related to patient's gallbladder stone disease. No complicating features.  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 3.1 cm cyst over the right-sided component of the horseshoe kidney.  Mild prostatic enlargement.  Atherosclerotic coronary artery disease.   Electronically Signed   By: Marin Olp M.D.   On: 01/22/2015 14:43   Dg Chest Port 1 View  01/26/2015   CLINICAL DATA:  Dyspnea.  EXAM: PORTABLE CHEST - 1 VIEW  COMPARISON:  01/23/2015 chest radiograph.  FINDINGS: Lung volumes are low.  Cardiomediastinal silhouette appears stable with top normal heart size accounting for low lung volumes and portable technique. No pneumothorax. No pleural effusion. Stable mild elevation of the right hemidiaphragm. There is vascular crowding due to the low lung volumes, with no focal lung opacity and no overt pulmonary edema. Partially visualized is left shoulder hemiarthroplasty.  IMPRESSION: Limited hypoinspiratory portable chest radiograph. No appreciable active cardiopulmonary disease.   Electronically Signed   By: Ilona Sorrel M.D.   On: 01/26/2015 09:41     CBC  Recent Labs Lab 01/23/15 0259 01/25/15 0432 01/25/15 2359 01/26/15 0644 01/27/15 0447  WBC 8.9 11.1* 7.7 10.1 7.5  HGB 12.6* 10.8* 11.0* 10.7* 9.6*  HCT 38.3* 34.3* 34.1* 34.6* 29.1*  PLT 131* 121* 129* 165 151  MCV 84.9 87.5 87.4 88.3 86.1  MCH 27.9 27.6 28.2 27.3 28.4  MCHC 32.9 31.5 32.3 30.9 33.0  RDW 13.4 14.1 14.2 14.5 14.2    Chemistries   Recent Labs Lab 01/23/15 0259 01/24/15 0700 01/25/15 0432 01/26/15 0644 01/27/15 0447  NA 140 135 138 136 136  K 4.8 4.5 4.5 4.7 3.9  CL 106 105 107 105 106  CO2 28 23 21* 21* 21*  GLUCOSE 162* 135* 137* 178* 162*  BUN 16 25* 37* 49* 58*  CREATININE 1.59* 2.11* 2.30* 3.03* 3.05*  CALCIUM 8.2* 7.2* 7.1* 7.1* 7.1*  AST 50* 25 22 57* 39  ALT 43 26 20 32 23  ALKPHOS 70 52 55 50 52  BILITOT 1.1 1.3* 1.5* 1.6* 1.4*   ------------------------------------------------------------------------------------------------------------------ estimated creatinine clearance is 23.5 mL/min (by C-G formula based on Cr of 3.05). ------------------------------------------------------------------------------------------------------------------ No results for input(s): HGBA1C in the last 72 hours. ------------------------------------------------------------------------------------------------------------------ No results for input(s): CHOL, HDL, LDLCALC, TRIG, CHOLHDL, LDLDIRECT in the  last 72 hours. ------------------------------------------------------------------------------------------------------------------ No results for input(s): TSH, T4TOTAL, T3FREE, THYROIDAB in the last 72 hours.  Invalid input(s): FREET3 ------------------------------------------------------------------------------------------------------------------ No results for input(s): VITAMINB12, FOLATE, FERRITIN, TIBC, IRON, RETICCTPCT in the last 72 hours.  Coagulation profile No results for input(s): INR, PROTIME in the last 168 hours.  No results for input(s): DDIMER in the last 72 hours.  Cardiac Enzymes No results for input(s): CKMB, TROPONINI, MYOGLOBIN in the last 168 hours.  Invalid input(s): CK ------------------------------------------------------------------------------------------------------------------ Invalid input(s): POCBNP     Time Spent in minutes   35 minutes   Brenten Janney M.D on 01/27/2015 at 2:06 PM  Between 7am to 7pm - Pager - [phone]  After 7pm go to [email or web address] - password [facility]  [facility]   Office  [phone]

## 2015-01-28 DIAGNOSIS — N183 Chronic kidney disease, stage 3 (moderate): Secondary | ICD-10-CM

## 2015-01-28 LAB — COMPREHENSIVE METABOLIC PANEL
ALK PHOS: 67 U/L (ref 38–126)
ALT: 20 U/L (ref 17–63)
AST: 32 U/L (ref 15–41)
Albumin: 2.4 g/dL — ABNORMAL LOW (ref 3.5–5.0)
Anion gap: 7 (ref 5–15)
BILIRUBIN TOTAL: 1.1 mg/dL (ref 0.3–1.2)
BUN: 53 mg/dL — ABNORMAL HIGH (ref 6–20)
CALCIUM: 7.3 mg/dL — AB (ref 8.9–10.3)
CO2: 21 mmol/L — ABNORMAL LOW (ref 22–32)
CREATININE: 2.64 mg/dL — AB (ref 0.61–1.24)
Chloride: 107 mmol/L (ref 101–111)
GFR, EST AFRICAN AMERICAN: 25 mL/min — AB (ref >60)
GFR, EST NON AFRICAN AMERICAN: 22 mL/min — AB (ref >60)
Glucose, Bld: 193 mg/dL — ABNORMAL HIGH (ref 65–99)
Potassium: 4.5 mmol/L (ref 3.5–5.1)
Sodium: 135 mmol/L (ref 135–145)
Total Protein: 5.6 g/dL — ABNORMAL LOW (ref 6.5–8.1)

## 2015-01-28 LAB — CBC
HEMATOCRIT: 30.6 % — AB (ref 39.0–52.0)
HEMOGLOBIN: 9.8 g/dL — AB (ref 13.0–17.0)
MCH: 27.8 pg (ref 26.0–34.0)
MCHC: 32 g/dL (ref 30.0–36.0)
MCV: 86.9 fL (ref 78.0–100.0)
Platelets: 157 10*3/uL (ref 150–400)
RBC: 3.52 MIL/uL — AB (ref 4.22–5.81)
RDW: 14.2 % (ref 11.5–15.5)
WBC: 11.1 10*3/uL — ABNORMAL HIGH (ref 4.0–10.5)

## 2015-01-28 LAB — GLUCOSE, CAPILLARY
GLUCOSE-CAPILLARY: 178 mg/dL — AB (ref 65–99)
GLUCOSE-CAPILLARY: 169 mg/dL — AB (ref 65–99)
Glucose-Capillary: 163 mg/dL — ABNORMAL HIGH (ref 65–99)
Glucose-Capillary: 182 mg/dL — ABNORMAL HIGH (ref 65–99)

## 2015-01-28 MED ORDER — PANTOPRAZOLE SODIUM 40 MG PO TBEC
40.0000 mg | DELAYED_RELEASE_TABLET | Freq: Every day | ORAL | Status: DC
  Administered 2015-01-28: 40 mg via ORAL
  Filled 2015-01-28: qty 1

## 2015-01-28 NOTE — Progress Notes (Signed)
Patient ID: Erik [name], male   DOB: 06-07-1940, 74 y.o.   MRN: [id number] 3 Days Post-Op  Subjective: Pt feels well today except for some wheezing.  Tolerating full liquids  Objective: Vital signs in last 24 hours: Temp:  [98.3 F (36.8 C)-98.7 F (37.1 C)] 98.7 F (37.1 C) (09/03 0411) Pulse Rate:  [85-119] 103 (09/03 0748) Resp:  [18] 18 (09/03 0748) BP: (112-141)/(52-73) 141/68 mmHg (09/03 0748) SpO2:  [96 %-99 %] 96 % (09/03 0748) Weight:  [92.307 kg (203 lb 8 oz)] 92.307 kg (203 lb 8 oz) (09/02 2022) Last BM Date: 01/27/15  Intake/Output from previous day: 09/02 0701 - 09/03 0700 In: 2600 [P.O.:1800; I.V.:800] Out: 600 [Urine:600] Intake/Output this shift: Total I/O In: 360 [P.O.:360] Out: -   PE: Abd: soft, appropriately tender, +BS, minimal distention, incisions c/d/i Lungs: Few crackles and some posterior wheezing Heart: irregular  Lab Results:   Recent Labs  01/27/15 0447 01/28/15 0415  WBC 7.5 11.1*  HGB 9.6* 9.8*  HCT 29.1* 30.6*  PLT 151 157   BMET  Recent Labs  01/27/15 0447 01/28/15 0415  NA 136 135  K 3.9 4.5  CL 106 107  CO2 21* 21*  GLUCOSE 162* 193*  BUN 58* 53*  CREATININE 3.05* 2.64*  CALCIUM 7.1* 7.3*   PT/INR No results for input(s): LABPROT, INR in the last 72 hours. CMP     Component Value Date/Time   NA 135 01/28/2015 0415   K 4.5 01/28/2015 0415   CL 107 01/28/2015 0415   CO2 21* 01/28/2015 0415   GLUCOSE 193* 01/28/2015 0415   BUN 53* 01/28/2015 0415   CREATININE 2.64* 01/28/2015 0415   CREATININE 1.79* 10/14/2014 1644   CALCIUM 7.3* 01/28/2015 0415   PROT 5.6* 01/28/2015 0415   ALBUMIN 2.4* 01/28/2015 0415   AST 32 01/28/2015 0415   ALT 20 01/28/2015 0415   ALKPHOS 67 01/28/2015 0415   BILITOT 1.1 01/28/2015 0415   GFRNONAA 22* 01/28/2015 0415   GFRAA 25* 01/28/2015 0415   Lipase     Component Value Date/Time   LIPASE 86* 01/24/2015 0700       Studies/Results: No results  found.  Anti-infectives: Anti-infectives    None       Assessment/Plan POD 3, s/p lap chole for gallstone pancreatitis -advance to soft diet -ok to resume xarelto from our standpoint -TB 1.1 and other LFTs normal. Would hold on MRCP at this point as patient is improving -cont to mobilize and pulm toilet -ok from surgical standpoint to DC home when medically stable -follow up arranged.  Please call us if needed prior to discharge ARI -per primary service     LOS: 6 days    Ramzi Brathwaite E 01/28/2015, 9:45 AM Pager: [phone]

## 2015-01-28 NOTE — Progress Notes (Signed)
PT Cancellation Note  Patient Details Name: Erik [name] MRN: [id number] DOB: [birth year]   Cancelled Treatment:    Reason Eval/Treat Not Completed: Medical issues which prohibited therapy;Patient declined, no reason specified.  Patient reports "I'm swollen all over.  The nurse was supposed to call the doctor"  Patient declined PT today.  Contacted RN to have her f/u with patient.   Despina Pole 01/28/2015, 7:09 PM Carita Pian. Sanjuana Kava, [place] Pager [phone]

## 2015-01-28 NOTE — Discharge Instructions (Signed)
[facility] ______[facility] °LAPAROSCOPIC SURGERY: POST OP INSTRUCTIONS °Always review your discharge instruction sheet given to you by the facility where your surgery was performed. °IF YOU HAVE DISABILITY OR FAMILY LEAVE FORMS, YOU MUST BRING THEM TO THE OFFICE FOR PROCESSING.   °DO NOT GIVE THEM TO YOUR DOCTOR. ° °1. A prescription for pain medication may be given to you upon discharge.  Take your pain medication as prescribed, if needed.  If narcotic pain medicine is not needed, then you may take acetaminophen (Tylenol) or ibuprofen (Advil) as needed. °2. Take your usually prescribed medications unless otherwise directed. °3. If you need a refill on your pain medication, please contact your pharmacy.  They will contact our office to request authorization. Prescriptions will not be filled after 5pm or on week-ends. °4. You should follow a light diet the first few days after arrival home, such as soup and crackers, etc.  Be sure to include lots of fluids daily. °5. Most patients will experience some swelling and bruising in the area of the incisions.  Ice packs will help.  Swelling and bruising can take several days to resolve.  °6. It is common to experience some constipation if taking pain medication after surgery.  Increasing fluid intake and taking a stool softener (such as Colace) will usually help or prevent this problem from occurring.  A mild laxative (Milk of Magnesia or Miralax) should be taken according to package instructions if there are no bowel movements after 48 hours. °7. Unless discharge instructions indicate otherwise, you may remove your bandages 24-48 hours after surgery, and you may shower at that time.  You may have steri-strips (small skin tapes) in place directly over the incision.  These strips should be left on the skin for 7-10 days.  If your surgeon used skin glue on the incision, you may shower in 24 hours.  The glue will flake off over the next 2-3 weeks.  Any sutures or  staples will be removed at the office during your follow-up visit. °8. ACTIVITIES:  You may resume regular (light) daily activities beginning the next day--such as daily self-care, walking, climbing stairs--gradually increasing activities as tolerated.  You may have sexual intercourse when it is comfortable.  Refrain from any heavy lifting or straining until approved by your doctor. °a. You may drive when you are no longer taking prescription pain medication, you can comfortably wear a seatbelt, and you can safely maneuver your car and apply brakes. °b. RETURN TO WORK:  __________________________________________________________ °9. You should see your doctor in the office for a follow-up appointment approximately 2-3 weeks after your surgery.  Make sure that you call for this appointment within a day or two after you arrive home to insure a convenient appointment time. °10. OTHER INSTRUCTIONS: __________________________________________________________________________________________________________________________ __________________________________________________________________________________________________________________________ °WHEN TO CALL YOUR DOCTOR: °1. Fever over 101.0 °2. Inability to urinate °3. Continued bleeding from incision. °4. Increased pain, redness, or drainage from the incision. °5. Increasing abdominal pain ° °The clinic staff is available to answer your questions during regular business hours.  Please don’t hesitate to call and ask to speak to one of the nurses for clinical concerns.  If you have a medical emergency, go to the nearest emergency room or call 911.  A surgeon from [facility] is always on call at the hospital. °[address], [address], [place], [place]  [address] ? [address] [address], [place], [place]   [address] °[phone] ? [phone] ? FAX [phone] °Web site:   [email or web address] °

## 2015-01-28 NOTE — Progress Notes (Signed)
Patient Demographics  Erik [name], is a 74 y.o. male, DOB - [birth year], [id number]  Admit date - 01/22/2015   Admitting Physician [name], MD  Outpatient Primary MD for the patient is [name], MD  LOS - 6   Chief Complaint  Patient presents with  . Abdominal Pain  . Back Pain       Admission HPI/Brief narrative: 74 year old Caucasian male with a past medical history of coronary artery disease, diabetes type 2, atrial fibrillation on anticoagulation, chronic kidney disease, presented with abdominal pain and back pain. Was noted to have acute pancreatitis. Thought to be secondary to gallstones,lap cholecystectomy on 8/31 by Dr. [name], finding suggestive of acute cholecystitis.  Subjective:   [name] today has, No headache, No chest pain, reports mild abdominal pain, - No Nausea, No new weakness tingling or numbness, No Cough - SOB. Passing flatus.  Assessment & Plan    Principal Problem:   Acute pancreatitis Active Problems:   Diabetes mellitus   CKD (chronic kidney disease), stage III   Atrial fibrillation   NICM (nonischemic cardiomyopathy)   Hypertension   GERD (gastroesophageal reflux disease)   Acute biliary pancreatitis   Acute on chronic renal failure   Acute biliary pancreatitis - Patient underwent laparoscopic cholecystectomy on 8/31, still have evidence of pancreatitis on surgery yesterday,Colon Was dilated, unfortunately cholangiogram could not be performed, no indication for MRCP given the stable LFTs as per surgery. - Tolerating full liquid diet, advanced to soft diet today - Patient was also recently started on Tradjenta which can also cause pancreatitis. Continue current management. - Patient does have a history of coronary artery disease. Had a low risk stress test last year. Echocardiogram as above. He should be able to proceed to surgery  without further testing. His x-ray did not show any edema.   Atrial fibrillation with RVR - On 8/29 he was noted to be in rapid ventricular response. He was given multiple doses of metoprolol intravenously and then started on Cardizem orally. Later resumed him back on his oral dose metoprolol, DC IV metoprolol and oral Cardizem.  -Resumed on Xarelto  as okay by surgery. - Added Cardizem to metoprolol given A. fib with RVR, heart rate is much better control today.  Acute on chronic kidney disease stage III - Baseline creatinine 1.7- 2, creatinine continued to increase gradually , peaked at 3, proving today at 2.6 on IV fluids, will start IV fluids giving signs of volume overload, recheck BMP in a.m.  Diastolic CHF - 2-D echo done earlier this admission showing grade 1 diastolic dysfunction. - Discontinue IV fluid   History of diabetes mellitus type 2 - CBGs are acceptable on Lantus and on sliding scale coverage.  -  Holding oral agents.   History of essential hypertension Stable. Blood pressure is reasonably well controlled.   Code Status: Full  Family Communication: Son at bedside  Disposition Plan: Discharge and 1-2 days if remains stable   Procedures  Status post laparoscopic cholecystectomy 8/31 by Dr. [name]   Consults   Gen. surgery Cardiology  Medications  Scheduled Meds: . diltiazem  30 mg Oral 4 times per day  . insulin aspart  0-9 Units Subcutaneous TID WC  . insulin glargine  18  Units Subcutaneous QHS  . metoprolol  75 mg Oral BID  . pantoprazole (PROTONIX) IV  40 mg Intravenous Q24H  . rivaroxaban  15 mg Oral Q supper  . sodium chloride  3 mL Intravenous Q12H   Continuous Infusions: . sodium chloride 100 mL/hr at 01/28/15 0133   PRN Meds:.acetaminophen **OR** acetaminophen, artificial tears, iohexol, ipratropium-albuterol, LORazepam, ondansetron **OR** ondansetron (ZOFRAN) IV, oxyCODONE  DVT Prophylaxis   SCDs, /xarelto  Lab Results  Component  Value Date   PLT 157 01/28/2015    Antibiotics   Anti-infectives    None          Objective:   Filed Vitals:   01/27/15 2358 01/28/15 0110 01/28/15 0411 01/28/15 0748  BP: 132/69  125/63 141/68  Pulse: 85 85 97 103  Temp: 98.7 F (37.1 C)  98.7 F (37.1 C)   TempSrc: Oral  Oral   Resp: 18 18 18 18   Height:      Weight:      SpO2: 98% 98% 99% 96%    Wt Readings from Last 3 Encounters:  01/27/15 92.307 kg (203 lb 8 oz)  10/14/14 88.905 kg (196 lb)  04/15/14 88.451 kg (195 lb)     Intake/Output Summary (Last 24 hours) at 01/28/15 1341 Last data filed at 01/28/15 1009  Gross per 24 hour  Intake   2480 ml  Output    200 ml  Net   2280 ml     Physical Exam  Awake Alert, Oriented X 3, No new F.N deficits, Normal affect [place].AT,PERRAL Supple Neck, No cervical lymphadenopathy appriciated.  Symmetrical Chest wall movement, Good air movement bilaterally, scattered wheezing, minimal basal crackles. Tachycardic, irregular,No Gallops,Rubs or new Murmurs, No Parasternal Heave +ve B.Sounds, laparoscopic surgical scar with no wheezing or discharge, mild distention, mild tenderness, No rigidity. No Cyanosis, Clubbing , mild pedal edema.    Data Review   Micro Results No results found for this or any previous visit (from the past 240 hour(s)).  Radiology Reports Dg Chest 2 View  01/23/2015   CLINICAL DATA:  Patient unable to stand  EXAM: CHEST  2 VIEW  COMPARISON:  09/16/2013  FINDINGS: Heart size is normal. There is mild asymmetric elevation of the right hemidiaphragm. Subsegmental atelectasis noted in the left base. No airspace consolidation.  IMPRESSION: 1. Asymmetric elevation of right hemidiaphragm. 2. Left base atelectasis.   Electronically Signed   By: Kerby Moors M.D.   On: 01/23/2015 11:12   Ct Abdomen Pelvis W Contrast  01/22/2015   CLINICAL DATA:  Epigastric pain and right lower quadrant pain radiating to back.  EXAM: CT ABDOMEN AND PELVIS WITH CONTRAST   TECHNIQUE: Multidetector CT imaging of the abdomen and pelvis was performed using the standard protocol following bolus administration of intravenous contrast.  CONTRAST:  4mL OMNIPAQUE IOHEXOL 300 MG/ML  SOLN  COMPARISON:  None.  FINDINGS: Lung bases are within normal. There is calcified plaque over the left anterior descending coronary artery.  Abdominal images demonstrate mild cholelithiasis. The liver, spleen and adrenal glands are normal. Appendix is normal. Small bowel and colon are within normal.  There is moderate peripancreatic fluid as well as fluid over the right pericolic gutter. Findings are compatible with acute pancreatitis. No significant complicating features.  A horseshoe kidney is present. There is no hydronephrosis or nephrolithiasis 3.1 cm simple renal cyst over the right-sided component of the horseshoe kidney. Ureters are normal.  Mild calcified plaque over the abdominal aorta and iliac arteries.  Pelvic  images demonstrate mild prominence of the prostate gland as the bladder and rectum are otherwise within normal.  There are moderate degenerative changes of the spine with prominent anterior osteophytes. Mild degenerate change of the hips.  IMPRESSION: Evidence of acute pancreatitis likely related to patient's gallbladder stone disease. No complicating features.  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 3.1 cm cyst over the right-sided component of the horseshoe kidney.  Mild prostatic enlargement.  Atherosclerotic coronary artery disease.   Electronically Signed   By: Marin Olp M.D.   On: 01/22/2015 14:43   Dg Chest Port 1 View  01/26/2015   CLINICAL DATA:  Dyspnea.  EXAM: PORTABLE CHEST - 1 VIEW  COMPARISON:  01/23/2015 chest radiograph.  FINDINGS: Lung volumes are low. Cardiomediastinal silhouette appears stable with top normal heart size accounting for low lung volumes and portable technique. No pneumothorax. No pleural effusion. Stable mild elevation of the right  hemidiaphragm. There is vascular crowding due to the low lung volumes, with no focal lung opacity and no overt pulmonary edema. Partially visualized is left shoulder hemiarthroplasty.  IMPRESSION: Limited hypoinspiratory portable chest radiograph. No appreciable active cardiopulmonary disease.   Electronically Signed   By: Ilona Sorrel M.D.   On: 01/26/2015 09:41     CBC  Recent Labs Lab 01/25/15 0432 01/25/15 2359 01/26/15 0644 01/27/15 0447 01/28/15 0415  WBC 11.1* 7.7 10.1 7.5 11.1*  HGB 10.8* 11.0* 10.7* 9.6* 9.8*  HCT 34.3* 34.1* 34.6* 29.1* 30.6*  PLT 121* 129* 165 151 157  MCV 87.5 87.4 88.3 86.1 86.9  MCH 27.6 28.2 27.3 28.4 27.8  MCHC 31.5 32.3 30.9 33.0 32.0  RDW 14.1 14.2 14.5 14.2 14.2    Chemistries   Recent Labs Lab 01/24/15 0700 01/25/15 0432 01/26/15 0644 01/27/15 0447 01/28/15 0415  NA 135 138 136 136 135  K 4.5 4.5 4.7 3.9 4.5  CL 105 107 105 106 107  CO2 23 21* 21* 21* 21*  GLUCOSE 135* 137* 178* 162* 193*  BUN 25* 37* 49* 58* 53*  CREATININE 2.11* 2.30* 3.03* 3.05* 2.64*  CALCIUM 7.2* 7.1* 7.1* 7.1* 7.3*  AST 25 22 57* 39 32  ALT 26 20 32 23 20  ALKPHOS 52 55 50 52 67  BILITOT 1.3* 1.5* 1.6* 1.4* 1.1   ------------------------------------------------------------------------------------------------------------------ estimated creatinine clearance is 27.2 mL/min (by C-G formula based on Cr of 2.64). ------------------------------------------------------------------------------------------------------------------ No results for input(s): HGBA1C in the last 72 hours. ------------------------------------------------------------------------------------------------------------------ No results for input(s): CHOL, HDL, LDLCALC, TRIG, CHOLHDL, LDLDIRECT in the last 72 hours. ------------------------------------------------------------------------------------------------------------------ No results for input(s): TSH, T4TOTAL, T3FREE, THYROIDAB in the last  72 hours.  Invalid input(s): FREET3 ------------------------------------------------------------------------------------------------------------------ No results for input(s): VITAMINB12, FOLATE, FERRITIN, TIBC, IRON, RETICCTPCT in the last 72 hours.  Coagulation profile No results for input(s): INR, PROTIME in the last 168 hours.  No results for input(s): DDIMER in the last 72 hours.  Cardiac Enzymes No results for input(s): CKMB, TROPONINI, MYOGLOBIN in the last 168 hours.  Invalid input(s): CK ------------------------------------------------------------------------------------------------------------------ Invalid input(s): POCBNP     Time Spent in minutes   30 minutes   Antonis Lor M.D on 01/28/2015 at 1:41 PM  Between 7am to 7pm - Pager - [phone]  After 7pm go to [email or web address] - password [facility]  [facility]   Office  [phone]

## 2015-01-29 LAB — BASIC METABOLIC PANEL
Anion gap: 9 (ref 5–15)
BUN: 61 mg/dL — AB (ref 6–20)
CO2: 19 mmol/L — ABNORMAL LOW (ref 22–32)
CREATININE: 2.57 mg/dL — AB (ref 0.61–1.24)
Calcium: 7.7 mg/dL — ABNORMAL LOW (ref 8.9–10.3)
Chloride: 105 mmol/L (ref 101–111)
GFR calc Af Amer: 26 mL/min — ABNORMAL LOW (ref >60)
GFR, EST NON AFRICAN AMERICAN: 23 mL/min — AB (ref >60)
Glucose, Bld: 141 mg/dL — ABNORMAL HIGH (ref 65–99)
Potassium: 4.5 mmol/L (ref 3.5–5.1)
SODIUM: 133 mmol/L — AB (ref 135–145)

## 2015-01-29 LAB — GLUCOSE, CAPILLARY: GLUCOSE-CAPILLARY: 138 mg/dL — AB (ref 65–99)

## 2015-01-29 MED ORDER — OXYCODONE HCL 5 MG PO TABS
5.0000 mg | ORAL_TABLET | Freq: Four times a day (QID) | ORAL | Status: DC | PRN
Start: 2015-01-29 — End: 2018-04-01

## 2015-01-29 MED ORDER — FUROSEMIDE 10 MG/ML IJ SOLN
20.0000 mg | Freq: Once | INTRAMUSCULAR | Status: AC
  Administered 2015-01-29: 20 mg via INTRAVENOUS
  Filled 2015-01-29: qty 2

## 2015-01-29 MED ORDER — TOUJEO SOLOSTAR 300 UNIT/ML ~~LOC~~ SOPN: 18.0000 [IU] | PEN_INJECTOR | Freq: Every day | SUBCUTANEOUS | Status: DC

## 2015-01-29 MED ORDER — ACETAMINOPHEN 325 MG PO TABS: 650.0000 mg | ORAL_TABLET | Freq: Four times a day (QID) | ORAL | Status: DC | PRN

## 2015-01-29 MED ORDER — DILTIAZEM HCL 30 MG PO TABS: 30.0000 mg | ORAL_TABLET | Freq: Four times a day (QID) | ORAL | Status: DC

## 2015-01-29 NOTE — Discharge Summary (Signed)
Erik [name], is a 74 y.o. male  DOB May 11, 1941  MRN [id number].  Admission date:  01/22/2015  Admitting Physician  Albertine Patricia, MD  Discharge Date:  01/29/2015   Primary MD  [name], MD  Recommendations for primary care physician for things to follow:  - Check labs including CBC, CMP duty next visit to ensure utilization of creatinine. - Cardizem has been added to metoprolol regimen for A. fib with RVR, reevaluate  during next visit see if it can be stopped   Admission Diagnosis  Gallstones [K80.20] Acute biliary pancreatitis [K85.1] Abdominal pain [R10.9]   Discharge Diagnosis  Gallstones [K80.20] Acute biliary pancreatitis [K85.1] Abdominal pain [R10.9]    Principal Problem:   Acute pancreatitis Active Problems:   Diabetes mellitus   CKD (chronic kidney disease), stage III   Atrial fibrillation   NICM (nonischemic cardiomyopathy)   Hypertension   GERD (gastroesophageal reflux disease)   Acute biliary pancreatitis   Acute on chronic renal failure      Past Medical History  Diagnosis Date  . Hyperlipidemia   . Hypertension   . Diabetes mellitus   . GERD (gastroesophageal reflux disease)   . NICM (nonischemic cardiomyopathy)     a. EF 35-40% in 2009, 55% in 2010. b. 09/2013: 35-40% by echo and 48% by nuc.  . Coronary artery disease     a. Nonobstructive by cath 2004. b. Nuc 09/2013 - no ischemia, fixed defect suggestive of possible prior infarction, felt low risk, EF 48%.   . Premature ventricular contractions   . Atrial fibrillation     a. 09/2013: RVR then bradycardia then spont conv to NSR.  . CKD (chronic kidney disease), stage III   . 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 Cardiac catheterization  10/25/00, 12/29/02  . Shoulder surgery Left 2011    Shoulder surgery, rod inserted  . Cholecystectomy N/A  01/25/2015    Procedure: LAPAROSCOPIC CHOLECYSTECTOMY ;  Surgeon: [name], MD;  Location: [facility];  Service: General;  Laterality: N/A;       History of present illness and  Hospital Course:     Kindly see H&P for history of present illness and admission details, please review complete Labs, Consult reports and Test reports for all details in brief  HPI  from the history and physical done on the day of admission  Erik [name] is a 74 y.o. male, with diabetes mellitus, coronary artery disease, atrial fibrillation on Eliquis, and chronic kidney disease who presents to the emergency department with abdominal and back pain. The patient reports he initially felt abdominal and back pain 2 weeks ago. It was located in his epigastrium and extended down each side of his abdomen and back. "I hurt all over". He felt this pain was anxiety and went to his primary care physician for anxiety medication. He was prescribed Valium and taking it seemed to help. Last night at midnight he ate barbecue and  went to bed. This morning at 7 AM he awoke with a burning sensation in his stomach. After a bowel movement there was some improvement, but after going back to bed his pain became severe. It radiated from his epigastrium down both the right and left sides of the stomach through to his back. He reports that the pain became worse with food intake. He tried Tylenol and Pepto-Bismol without relief. The patient reports he started taking a new medication, tradjenta, approximately 2 weeks ago.  In the emergency department his lipase is 1940, and CT scan shows acute pancreatitis as well as cholelithiasis. His creatinine is elevated but at baseline. WBC is not elevated, AST is 47 ALT 26 total bilirubin 1.2 alkaline phosphatase 73.  Hospital Course   Acute biliary pancreatitis - Patient underwent laparoscopic cholecystectomy on 8/31,  have evidence of pancreatitis on surgery , Colon Was dilated, unfortunately  cholangiogram could not be performed, no indication for MRCP given the stable LFTs as per surgery team. - Tolerating full liquid diet, advanced to soft diet today - Follow up with surgery as an outpatient  Atrial fibrillation with RVR - On 8/29 he was noted to be in rapid ventricular response. He was given multiple doses of metoprolol intravenously and then started on Cardizem orally. Later resumed him back on his oral dose metoprolol, DC IV metoprolol and oral Cardizem. -Resumed on Xarelto as okay by surgery. - Added Cardizem to metoprolol given A. fib with RVR, heart rate is much better controlled, did not want to increase his metoprolol given occasional wheezing, reassess as an outpatient if Cardizem can be stopped.  Acute on chronic kidney disease stage III - Baseline creatinine 1.7- 2, creatinine continued to increase gradually , peaked at 3, improving after IV fluids , a creatinine on discharge 2.5, continued to improve despite IV fluids over the last 24 hours. - Continue to hold enalapril  Diastolic CHF - 2-D echo done earlier this admission showing grade 1 diastolic dysfunction.   History of diabetes mellitus type 2 - Lantus dose was lowered on discharge, will be discharged on 18 units Lantus subcutaneous every at bedtime    History of essential hypertension Stable. Blood pressure is reasonably well controlled(hydralazine and lisinopril held on discharge, started on Cardizem)    Discharge Condition:  Stable   Follow UP  Follow-up Information    Follow up with [facility] On 02/21/2015.   Specialty:  General Surgery   Why:  [facility] Week [facility], 1:45pm, arrive no later than 1:15pm for paperwork   Contact information:   [place] [address] [phone]       Follow up with [name], MD. Call in 1 week.   Specialty:  Internal Medicine   Why:  Posthospitalization follow-up, and labs check.   Contact information:    [address] [name] [address] [phone]       Follow up with [name], MD. Call in 3 weeks.   Specialty:  Cardiology   Why:  A. fib follow-up   Contact information:   [place] [phone]         Discharge Instructions  and  Discharge Medications     Discharge Instructions    Diet - low sodium heart healthy    Complete by:  As directed      Discharge instructions    Complete by:  As directed   Follow with Primary  MD [name], MD in 7 days   Get CBC, CMP,   by Primary MD next visit.    Activity: As tolerated with Full fall precautions use walker/cane & assistance as needed   Disposition Home **   Diet: Heart Healthy , carbohydrate modified , with feeding assistance and aspiration precautions.  For Heart failure patients - Check your Weight same time everyday, if you gain over 2 pounds, or you develop in leg swelling, experience more shortness of breath or chest pain, call your Primary MD immediately. Follow Cardiac Low Salt Diet and 1.5 lit/day fluid restriction.   On your next visit with your primary care physician please Get Medicines reviewed and adjusted.   Please request your Prim.MD to go over all Hospital Tests and Procedure/Radiological results at the follow up, please get all Hospital records sent to your Prim MD by signing hospital release before you go home.   If you experience worsening of your admission symptoms, develop shortness of breath, life threatening emergency, suicidal or homicidal thoughts you must seek medical attention immediately by calling 911 or calling your MD immediately  if symptoms less severe.  You Must read complete instructions/literature along with all the possible adverse reactions/side effects for all the Medicines you take and that have been prescribed to you. Take any new Medicines after you have completely understood and accpet all the possible adverse  reactions/side effects.   Do not drive, operating heavy machinery, perform activities at heights, swimming or participation in water activities or provide baby sitting services if your were admitted for syncope or siezures until you have seen by Primary MD or a Neurologist and advised to do so again.  Do not drive when taking Pain medications.    Do not take more than prescribed Pain, Sleep and Anxiety Medications  Special Instructions: If you have smoked or chewed Tobacco  in the last 2 yrs please stop smoking, stop any regular Alcohol  and or any Recreational drug use.  Wear Seat belts while driving.   Please note  You were cared for by a hospitalist during your hospital stay. If you have any questions about your discharge medications or the care you received while you were in the hospital after you are discharged, you can call the unit and asked to speak with the hospitalist on call if the hospitalist that took care of you is not available. Once you are discharged, your primary care physician will handle any further medical issues. Please note that NO REFILLS for any discharge medications will be authorized once you are discharged, as it is imperative that you return to your primary care physician (or establish a relationship with a primary care physician if you do not have one) for your aftercare needs so that they can reassess your need for medications and monitor your lab values.     Increase activity slowly    Complete by:  As directed             Medication List    STOP taking these medications        enalapril 5 MG tablet  Commonly known as:  VASOTEC     hydrALAZINE 10 MG tablet  Commonly known as:  APRESOLINE      TAKE these medications        acetaminophen 325 MG tablet  Commonly known as:  TYLENOL  Take 2 tablets (650 mg total) by mouth every 6 (six) hours as needed for mild pain (or Fever >/= 101).  atorvastatin 40 MG tablet  Commonly known as:  LIPITOR    Take 40 mg by mouth daily at 6 PM.     clonazePAM 0.5 MG tablet  Commonly known as:  KLONOPIN  Take 0.5 mg by mouth daily.     CoQ10 200 MG Caps  Take 100 mg by mouth daily.     diltiazem 30 MG tablet  Commonly known as:  CARDIZEM  Take 1 tablet (30 mg total) by mouth every 6 (six) hours.     DRY EYES OP  Place 1 drop into both eyes daily as needed (for dry eyes).     metoprolol 50 MG tablet  Commonly known as:  LOPRESSOR  Take 1.5 tablets (75 mg total) by mouth 2 (two) times daily.     omeprazole 10 MG capsule  Commonly known as:  PRILOSEC  Take 20 mg by mouth daily.     oxyCODONE 5 MG immediate release tablet  Commonly known as:  Oxy IR/ROXICODONE  Take 1 tablet (5 mg total) by mouth every 6 (six) hours as needed for severe pain.     Rivaroxaban 15 MG Tabs tablet  Commonly known as:  XARELTO  Take 1 tablet (15 mg total) by mouth daily with supper.     tamsulosin 0.4 MG Caps capsule  Commonly known as:  FLOMAX  Take 0.4 mg by mouth at bedtime.     TOUJEO SOLOSTAR 300 UNIT/ML Sopn  Generic drug:  Insulin Glargine  Inject 18 Units into the skin at bedtime.          Diet and Activity recommendation: See Discharge Instructions above   Consults obtained -  Gen. Surgery Cardiology  Major procedures and Radiology Reports - PLEASE review detailed and final reports for all details, in brief -   Status post laparoscopic cholecystectomy 8/31 by Dr. [name]   Dg Chest 2 View  01/23/2015   CLINICAL DATA:  Patient unable to stand  EXAM: CHEST  2 VIEW  COMPARISON:  09/16/2013  FINDINGS: Heart size is normal. There is mild asymmetric elevation of the right hemidiaphragm. Subsegmental atelectasis noted in the left base. No airspace consolidation.  IMPRESSION: 1. Asymmetric elevation of right hemidiaphragm. 2. Left base atelectasis.   Electronically Signed   By: Kerby Moors M.D.   On: 01/23/2015 11:12   Ct Abdomen Pelvis W Contrast  01/22/2015   CLINICAL DATA:   Epigastric pain and right lower quadrant pain radiating to back.  EXAM: CT ABDOMEN AND PELVIS WITH CONTRAST  TECHNIQUE: Multidetector CT imaging of the abdomen and pelvis was performed using the standard protocol following bolus administration of intravenous contrast.  CONTRAST:  77mL OMNIPAQUE IOHEXOL 300 MG/ML  SOLN  COMPARISON:  None.  FINDINGS: Lung bases are within normal. There is calcified plaque over the left anterior descending coronary artery.  Abdominal images demonstrate mild cholelithiasis. The liver, spleen and adrenal glands are normal. Appendix is normal. Small bowel and colon are within normal.  There is moderate peripancreatic fluid as well as fluid over the right pericolic gutter. Findings are compatible with acute pancreatitis. No significant complicating features.  A horseshoe kidney is present. There is no hydronephrosis or nephrolithiasis 3.1 cm simple renal cyst over the right-sided component of the horseshoe kidney. Ureters are normal.  Mild calcified plaque over the abdominal aorta and iliac arteries.  Pelvic images demonstrate mild prominence of the prostate gland as the bladder and rectum are otherwise within normal.  There are moderate degenerative changes of the spine  with prominent anterior osteophytes. Mild degenerate change of the hips.  IMPRESSION: Evidence of acute pancreatitis likely related to patient's gallbladder stone disease. No complicating features.  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 3.1 cm cyst over the right-sided component of the horseshoe kidney.  Mild prostatic enlargement.  Atherosclerotic coronary artery disease.   Electronically Signed   By: Marin Olp M.D.   On: 01/22/2015 14:43   Dg Chest Port 1 View  01/26/2015   CLINICAL DATA:  Dyspnea.  EXAM: PORTABLE CHEST - 1 VIEW  COMPARISON:  01/23/2015 chest radiograph.  FINDINGS: Lung volumes are low. Cardiomediastinal silhouette appears stable with top normal heart size accounting for low  lung volumes and portable technique. No pneumothorax. No pleural effusion. Stable mild elevation of the right hemidiaphragm. There is vascular crowding due to the low lung volumes, with no focal lung opacity and no overt pulmonary edema. Partially visualized is left shoulder hemiarthroplasty.  IMPRESSION: Limited hypoinspiratory portable chest radiograph. No appreciable active cardiopulmonary disease.   Electronically Signed   By: Ilona Sorrel M.D.   On: 01/26/2015 09:41    Micro Results    No results found for this or any previous visit (from the past 240 hour(s)).     Today   Subjective:   Erik [name] today has no headache,no chest abdominal pain,no new weakness tingling or numbness, feels  today.   Objective:   Blood pressure 120/69, pulse 81, temperature 98.7 F (37.1 C), temperature source Oral, resp. rate 20, height 5\' 10"  (1.778 m), weight 100.154 kg (220 lb 12.8 oz), SpO2 96 %.   Intake/Output Summary (Last 24 hours) at 01/29/15 1113 Last data filed at 01/29/15 0700  Gross per 24 hour  Intake    840 ml  Output    400 ml  Net    440 ml    Exam Awake Alert, Oriented x 3, No new F.N deficits, Normal affect [place].AT,PERRAL Supple Neck,No JVD, No cervical lymphadenopathy appriciated.  Symmetrical Chest wall movement, Good air movement bilaterally, no wheezing or crackles RRR,No Gallops,Rubs or new Murmurs, No Parasternal Heave +ve B.Sounds, Abd Soft, Non tender, No organomegaly appriciated, No rebound -guarding or rigidity. No Cyanosis, Clubbing,  mild pitting edema.  Data Review   CBC w Diff: Lab Results  Component Value Date   WBC 11.1* 01/28/2015   HGB 9.8* 01/28/2015   HCT 30.6* 01/28/2015   PLT 157 01/28/2015   LYMPHOPCT 27 10/14/2014   MONOPCT 10 10/14/2014   EOSPCT 3 10/14/2014   BASOPCT 1 10/14/2014    CMP: Lab Results  Component Value Date   NA 133* 01/29/2015   K 4.5 01/29/2015   CL 105 01/29/2015   CO2 19* 01/29/2015   BUN 61* 01/29/2015    CREATININE 2.57* 01/29/2015   CREATININE 1.79* 10/14/2014   PROT 5.6* 01/28/2015   ALBUMIN 2.4* 01/28/2015   BILITOT 1.1 01/28/2015   ALKPHOS 67 01/28/2015   AST 32 01/28/2015   ALT 20 01/28/2015  .   Total Time in preparing paper work, data evaluation and todays exam - 35 minutes  ELGERGAWY, DAWOOD M.D on 01/29/2015 at [facility]  [phone]

## 2015-01-29 NOTE — Care Management Note (Signed)
Case Management Note  Patient Details  Name: Erik [name] MRN: [id number] Date of Birth: [birth year]  Subjective/Objective:     Acute pancreatitis               Action/Plan: Late entry- NCM spoke to pt's son, Dwayne prior to dc and offered choice for [facility]. Son agreeable to [facility] for [facility]. Referral made to [facility] for [facility] for scheduled dc home today. Has RW at home.   Expected Discharge Date:                  Expected Discharge Plan:  [place]  In-House Referral:  NA  Discharge planning Services  CM Consult  Post Acute Care Choice:  Home Health Choice offered to:  Adult Children   HH Arranged:  PT HH Agency:  [facility]  Status of Service:  Completed, signed off  Medicare Important Message Given:  Yes-second notification given Date Medicare IM Given:    Medicare IM give by:    Date Additional Medicare IM Given:    Additional Medicare Important Message give by:     If discussed at [place] of Stay Meetings, dates discussed:    Additional Comments:  Erenest Rasher, RN 01/29/2015, 8:58 PM

## 2015-02-02 ENCOUNTER — Telehealth: Payer: Self-pay | Admitting: Cardiology

## 2015-02-02 NOTE — Telephone Encounter (Signed)
New message     Pt has some questions regarding his medications; pt was taken off medication at hospital and want to know if he should start taking again Pt was discharged from hospital on September 4th

## 2015-02-02 NOTE — Telephone Encounter (Signed)
Pt calling to clarify if he should be taking enalapril anymore, for he states this is not on his recent medication list, when he was discharged from the hospital on 01/29/15.  Informed the pt that the discharge medications are correct and he is NOT to be taking enalapril anymore, for this was d/ced when he was admitted to the hospital.  Thoroughly went over the pts medication list over the phone with him.  Informed the pt that I will send him a current copy of his med list to him in the mail at his current address.  Pt verbalized understanding, and gracious for all the assistance provided.

## 2015-02-04 ENCOUNTER — Other Ambulatory Visit: Payer: Self-pay | Admitting: Cardiology

## 2015-02-09 DIAGNOSIS — R600 Localized edema: Secondary | ICD-10-CM | POA: Insufficient documentation

## 2015-02-09 DIAGNOSIS — F411 Generalized anxiety disorder: Secondary | ICD-10-CM | POA: Insufficient documentation

## 2015-02-23 DIAGNOSIS — I1310 Hypertensive heart and chronic kidney disease without heart failure, with stage 1 through stage 4 chronic kidney disease, or unspecified chronic kidney disease: Secondary | ICD-10-CM | POA: Insufficient documentation

## 2015-02-23 DIAGNOSIS — D692 Other nonthrombocytopenic purpura: Secondary | ICD-10-CM | POA: Insufficient documentation

## 2015-02-28 ENCOUNTER — Ambulatory Visit (INDEPENDENT_AMBULATORY_CARE_PROVIDER_SITE_OTHER): Payer: Medicare Other | Admitting: Cardiology

## 2015-02-28 ENCOUNTER — Encounter: Payer: Self-pay | Admitting: Cardiology

## 2015-02-28 VITALS — BP 118/60 | HR 60 | Ht 70.0 in | Wt 196.0 lb

## 2015-02-28 DIAGNOSIS — I480 Paroxysmal atrial fibrillation: Secondary | ICD-10-CM

## 2015-02-28 DIAGNOSIS — I10 Essential (primary) hypertension: Secondary | ICD-10-CM | POA: Diagnosis not present

## 2015-02-28 DIAGNOSIS — I5020 Unspecified systolic (congestive) heart failure: Secondary | ICD-10-CM | POA: Diagnosis not present

## 2015-02-28 LAB — COMPREHENSIVE METABOLIC PANEL
ALT: 9 U/L (ref 9–46)
AST: 14 U/L (ref 10–35)
Albumin: 3.4 g/dL — ABNORMAL LOW (ref 3.6–5.1)
Alkaline Phosphatase: 80 U/L (ref 40–115)
BUN: 20 mg/dL (ref 7–25)
CO2: 29 mmol/L (ref 20–31)
Calcium: 8.2 mg/dL — ABNORMAL LOW (ref 8.6–10.3)
Chloride: 105 mmol/L (ref 98–110)
Creat: 1.65 mg/dL — ABNORMAL HIGH (ref 0.70–1.18)
Glucose, Bld: 120 mg/dL — ABNORMAL HIGH (ref 65–99)
Potassium: 4.1 mmol/L (ref 3.5–5.3)
Sodium: 141 mmol/L (ref 135–146)
Total Bilirubin: 0.5 mg/dL (ref 0.2–1.2)
Total Protein: 6 g/dL — ABNORMAL LOW (ref 6.1–8.1)

## 2015-02-28 LAB — BRAIN NATRIURETIC PEPTIDE: Brain Natriuretic Peptide: 235.5 pg/mL — ABNORMAL HIGH (ref 0.0–100.0)

## 2015-02-28 MED ORDER — FUROSEMIDE 40 MG PO TABS: 40.0000 mg | ORAL_TABLET | Freq: Every day | ORAL | Status: DC | PRN

## 2015-02-28 MED ORDER — DILTIAZEM HCL ER COATED BEADS 120 MG PO CP24: 120.0000 mg | ORAL_CAPSULE | Freq: Every day | ORAL | Status: DC

## 2015-02-28 NOTE — Progress Notes (Signed)
Patient ID: Erik [name], male   DOB: 28-Apr-1941, 74 y.o.   MRN: [id number] Patient ID: Erik [name], male   DOB: 01/29/1941, 74 y.o.   MRN: [id number]    Date:  02/28/2015   ID:  Erik [name], Erik [name] 09/11/1940, MRN [id number]  PCP:  [name], MD  Cardiologist:  Dr. Jenell Milliner => Dr. Ena Dawley     Chief complain: Follow-up for decrease in LVEF.  History of Present Illness: Erik [name] is a 74 y.o. male with a hx of no significant CAD on cath in 2004, PVCs, NICM (EF 35% in past and improved to 55% in 2010), CKD, DM, HTN, HL.  He was admitted 4/28-5/1 with chest discomfort in the setting of AFib with RVR. He converted to NSR on IV dilt. Cardiac enzymes remained normal.  D-dimer was elevated and VQ scan was low probability for pulmonary embolism. He was placed on Xarelto for anticoagulation (CHADS2-VASc=3).  Echocardiogram demonstrated worsening LV function with an EF of 35-40% and inferior and inferolateral hypokinesis. Patient was set up for a nuclear study as an inpatient. This demonstrated mid to apical anteroseptal perfusion defect suggestive of possible infarct but no ischemia, EF 48%. This was felt to be a low risk study. Decision was made to treat him medically.  Of note, on enalapril was stopped in the setting of CKD.  Carbamazepine was stopped due to interaction with Xarelto.  He returns for follow up. He is doing well.  The patient denies chest pain, shortness of breath, syncope, orthopnea, PND or pedal edema.  He was told to remain off of metformin and is taking glimepiride.  04/15/2014 - this is 5 months follow-up. At the last visit in June we found out that patient's ejection fraction dropped to 35-40%. He's ACE inhibitor was discontinued because of CK D stage III. We restarted lisinopril and his follow-up echo showed improvement of his ejection fraction to 50-55%. Patient states today that his functional capacity is good, he denies any angina or shortness of breath. He  denies any palpitations. He's very compliant he with his medications. He has several correction regarding taking over-the-counter supplements including selenium, L-arginine, and magnesium.  10/14/14 - the patient denies any chest pain, DOE has improved since being seen in our clinic, no LE edema, no orthopnea, PND. He has been prescribed xanax for anxiety and has been experiencing presyncopal episodes. No syncope.  02/28/2015 - he underwent an urgent cholecystectomy on 01/23/15, fluid overloaded post surgery, started on lasix 40 mg po daily for 16 lbs fluid overload, now back to baseline. No DOE, CP, LE edema, orthopnea, PND. No palpitations. Had an episode of PAF while in the hospital.  Studies:  - LHC (12/2002):  No significant CAD, EF 30-35% (done after CLite demonstrated poss inf ischemia).  - Echo (09/22/13):  EF 35-40%, inferolateral and inf HK, Gr 2 DD, mild LAE.   - Nuclear (09/23/13):  Poss anteroseptal infarct, no ischemia, EF 48%; Low Risk    Recent Labs: 01/28/2015: ALT 20; Hemoglobin 9.8* 01/29/2015: Creatinine, Ser 2.57*; Potassium 4.55/10/2013:  K 4.7, creatinine 1.9, Hgb 16.1  Wt Readings from Last 3 Encounters:  02/28/15 196 lb (88.905 kg)  01/28/15 220 lb 12.8 oz (100.154 kg)  10/14/14 196 lb (88.905 kg)     Past Medical History  Diagnosis Date  . Hyperlipidemia   . Hypertension   . Diabetes mellitus   . GERD (gastroesophageal reflux disease)   . NICM (nonischemic cardiomyopathy) ([place])  a. EF 35-40% in 2009, 55% in 2010. b. 09/2013: 35-40% by echo and 48% by nuc.  . Coronary artery disease     a. Nonobstructive by cath 2004. b. Nuc 09/2013 - no ischemia, fixed defect suggestive of possible prior infarction, felt low risk, EF 48%.   . Premature ventricular contractions   . Atrial fibrillation ([place])     a. 09/2013: RVR then bradycardia then spont conv to NSR.  . CKD (chronic kidney disease), stage III   . 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 acetaminophen (TYLENOL) 325 MG tablet Take 2 tablets (650 mg total) by mouth every 6 (six) hours as needed for mild pain (or Fever >/= 101).    . Artificial Tear Ointment (DRY EYES OP) Place 1 drop into both eyes daily as needed (for dry eyes).    [name] atorvastatin (LIPITOR) 40 MG tablet Take 40 mg by mouth daily at 6 PM.   3  . clonazePAM (KLONOPIN) 0.5 MG tablet Take 0.5 mg by mouth daily.  0  . Coenzyme Q10 (COQ10) 200 MG CAPS Take 100 mg by mouth daily.     [name] diltiazem (CARDIZEM) 30 MG tablet Take 1 tablet (30 mg total) by mouth every 6 (six) hours. 160 tablet 0  . metoprolol (LOPRESSOR) 50 MG tablet TAKE 1& ONE-HALF TABLETS BY MOUTH TWICE DAILY 90 tablet 0  . omeprazole (PRILOSEC) 10 MG capsule Take 20 mg by mouth daily.     [name] oxyCODONE (OXY IR/ROXICODONE) 5 MG immediate release tablet Take 1 tablet (5 mg total) by mouth every 6 (six) hours as needed for severe pain. 30 tablet 0  . Rivaroxaban (XARELTO) 15 MG TABS tablet Take 1 tablet (15 mg total) by mouth daily with supper. 30 tablet 3  . tamsulosin (FLOMAX) 0.4 MG CAPS capsule Take 0.4 mg by mouth at bedtime.    Erik [name] SOLOSTAR 300 UNIT/ML SOPN Inject 18 Units into the skin at bedtime.  3   No current facility-administered medications for this visit.    Allergies:   Claritin and Tradjenta   Social History:  The patient  reports that he quit smoking about 30 years ago. His smoking use included Cigarettes. He has a 5 pack-year smoking history. He does not have any smokeless tobacco history on file. He reports that he does not use illicit drugs.   Family History:  The patient's family history includes Diabetes in his brother and mother; Heart disease in his brother, father, and mother; Hypertension in his brother and mother. There is no history of Heart attack or Stroke.   ROS:  Please see the history of present illness.   He denies bleeding problems.   All other systems reviewed and  negative.   PHYSICAL EXAM: VS:  BP 118/60 mmHg  Pulse 60  Ht 5\' 10"  (1.778 m)  Wt 196 lb (88.905 kg)  BMI 28.12 kg/m2 Well nourished, well developed, in no acute distress HEENT: normal Neck: no JVD Cardiac:  normal S1, S2; RRR; no murmur Lungs:  clear to auscultation bilaterally, no wheezing, rhonchi or rales Abd: soft, nontender, no hepatomegaly Ext: no edema Skin: warm and dry Neuro:  CNs 2-12 intact, no focal abnormalities noted  EKG:  NSR, HR 74, normal axis, PVCs, nonspecific ST-T wave changes     ECHO: 12/10/2013  - Left ventricle: The cavity size was moderately dilated. Wall thickness was normal. Systolic function was normal.  The estimated ejection fraction was in the range of 50% to 55%. - Left atrium: The atrium was mildly dilated.  TTE: 01/23/15 - Left ventricle: The cavity size was normal. Systolic function was normal. The estimated ejection fraction was in the range of 50% to 55%. There is hypokinesis of the basal-midinferior myocardium. Doppler parameters are consistent with abnormal left ventricular relaxation (grade 1 diastolic dysfunction). - Ventricular septum: Septal motion showed paradox. - Mitral valve: Mildly calcified annulus. There was mild regurgitation. - Left atrium: The atrium was mildly dilated.  Impressions:  - Compared to the prior study, there has been no significant interval change.   ASSESSMENT AND PLAN:  1. Atrial fibrillation:  He remains in NSR.  PAF post op. He is tolerating Xarelto.    Continue current dose of beta blocker, switch dilatiazem to once daily dose 120 mg CD.  2. Coronary artery disease:  Presumed given WMA on echo and scar on Nuclear study, not seen on the most recent echo in July 2015.[name]  He is asymptomatic.  Continue med Rx.  He is not on ASA as he is on Xarelto.  Continue beta blocker, statin.   3.   NICM (nonischemic cardiomyopathy):  His EF has improved to 50-55% with lisinopril, disconyinued for  worsening CK D stage III, baseline crea 1.7, the last 2.54, chcek today. Discontinue lasix daily and start only PRN.    4.   Hypertension:  Controlled.   5.  HYPERLIPIDEMIA:  Continue statin.   6.  CKD (chronic kidney disease), stage III:  As above.    7. Type II diabetes mellitus with renal manifestations:  Resume ACEI as noted above.  He is strongly advised to stop taking xanax to avoid syncope and never drive after taking it.   BMP, CBC, LFTs, BNP today and follow up in 6 months.   Erik [name]  02/28/2015 9:05 AM

## 2015-02-28 NOTE — Patient Instructions (Signed)
Medication Instructions:   STOP TAKING LASIX 40 MG DAILY NOW  STOP TAKING CARDIZEM 30 MG NOW  START TAKING LASIX 40 MG BY MOUTH DAILY, ONLY AS NEEDED FOR LOWER EXTREMITY EDEMA  START TAKING CARDIZEM CD 120 MG ONCE DAILY    Labwork:  TODAY---CMET AND BNP     Follow-Up:  Your physician wants you to follow-up in: 6 MONTHS WITH DR Johann Capers will receive a reminder letter in the mail two months in advance. If you don't receive a letter, please call our office to schedule the follow-up appointment.

## 2015-03-15 ENCOUNTER — Other Ambulatory Visit: Payer: Self-pay | Admitting: Cardiology

## 2015-04-07 DIAGNOSIS — D126 Benign neoplasm of colon, unspecified: Secondary | ICD-10-CM | POA: Insufficient documentation

## 2015-06-08 ENCOUNTER — Other Ambulatory Visit: Payer: Self-pay | Admitting: Gastroenterology

## 2015-07-18 ENCOUNTER — Telehealth: Payer: Self-pay | Admitting: Cardiology

## 2015-07-18 ENCOUNTER — Other Ambulatory Visit: Payer: Self-pay | Admitting: *Deleted

## 2015-07-18 MED ORDER — RIVAROXABAN 15 MG PO TABS: 15.0000 mg | ORAL_TABLET | Freq: Every day | ORAL | Status: DC

## 2015-07-18 NOTE — Telephone Encounter (Signed)
Spoke with patient and he stated that his pcp has been providing him with samples. I will send him in an rx and he will call the pharmacy to check on the cost of the medication. I mentioned to him that he can apply for patient assistance if he is unable to afford the medication. He does have a two week supply on hand.

## 2015-07-18 NOTE — Telephone Encounter (Signed)
Patient calling the office for samples of medication:   1.  What medication and dosage are you requesting samples for? XARELTO 15MG   2.  Are you currently out of this medication?  2 weeks left

## 2015-09-12 ENCOUNTER — Ambulatory Visit (INDEPENDENT_AMBULATORY_CARE_PROVIDER_SITE_OTHER): Payer: Medicare Other | Admitting: Cardiology

## 2015-09-12 ENCOUNTER — Encounter: Payer: Self-pay | Admitting: Cardiology

## 2015-09-12 VITALS — BP 128/74 | HR 53 | Ht 70.0 in | Wt 203.0 lb

## 2015-09-12 DIAGNOSIS — E785 Hyperlipidemia, unspecified: Secondary | ICD-10-CM | POA: Diagnosis not present

## 2015-09-12 DIAGNOSIS — I5022 Chronic systolic (congestive) heart failure: Secondary | ICD-10-CM | POA: Diagnosis not present

## 2015-09-12 DIAGNOSIS — I429 Cardiomyopathy, unspecified: Secondary | ICD-10-CM

## 2015-09-12 DIAGNOSIS — I10 Essential (primary) hypertension: Secondary | ICD-10-CM

## 2015-09-12 DIAGNOSIS — J320 Chronic maxillary sinusitis: Secondary | ICD-10-CM

## 2015-09-12 DIAGNOSIS — I5043 Acute on chronic combined systolic (congestive) and diastolic (congestive) heart failure: Secondary | ICD-10-CM | POA: Diagnosis not present

## 2015-09-12 DIAGNOSIS — I428 Other cardiomyopathies: Secondary | ICD-10-CM

## 2015-09-12 MED ORDER — FLUTICASONE PROPIONATE 50 MCG/ACT NA SUSP: 2.0000 | Freq: Every day | NASAL | Status: DC

## 2015-09-12 NOTE — Patient Instructions (Signed)
Medication Instructions:   DR [name] HAS ADVISED THAT YOU PURCHASE OTC FLUTICASONE (FLONASE) SPRAY--USE 2 SPRAYS IN EACH NOSTRIL DAILY FOR CHRONIC SINUSITIS.     Follow-Up:  Your physician wants you to follow-up in: [place] will receive a reminder letter in the mail two months in advance. If you don't receive a letter, please call our office to schedule the follow-up appointment.      If you need a refill on your cardiac medications before your next appointment, please call your pharmacy.

## 2015-09-12 NOTE — Progress Notes (Signed)
Patient ID: Erik [name], male   DOB: 1940-07-23, 75 y.o.   MRN: [id number]    Date:  09/12/2015   ID:  Erik [name], Erik [name] [birth year], MRN [id number]  PCP:  [name], MD  Cardiologist:  Dr. Jenell Milliner => Dr. Ena Dawley     Chief complain: Follow-up for decrease in LVEF.  History of Present Illness: Erik [name] is a 75 y.o. male with a hx of no significant CAD on cath in 2004, PVCs, NICM (EF 35% in past and improved to 55% in 2010), CKD, DM, HTN, HL.  He was admitted 4/28-5/1 with chest discomfort in the setting of AFib with RVR. He converted to NSR on IV dilt. Cardiac enzymes remained normal.  D-dimer was elevated and VQ scan was low probability for pulmonary embolism. He was placed on Xarelto for anticoagulation (CHADS2-VASc=3).  Echocardiogram demonstrated worsening LV function with an EF of 35-40% and inferior and inferolateral hypokinesis. Patient was set up for a nuclear study as an inpatient. This demonstrated mid to apical anteroseptal perfusion defect suggestive of possible infarct but no ischemia, EF 48%. This was felt to be a low risk study. Decision was made to treat him medically.  Of note, on enalapril was stopped in the setting of CKD.  Carbamazepine was stopped due to interaction with Xarelto.  04/15/2014 - this is 5 months follow-up. At the last visit in June we found out that patient's ejection fraction dropped to 35-40%. He's ACE inhibitor was discontinued because of CK D stage III. We restarted lisinopril and his follow-up echo showed improvement of his ejection fraction to 50-55%. Patient states today that his functional capacity is good, he denies any angina or shortness of breath. He denies any palpitations. He's very compliant he with his medications. He has several correction regarding taking over-the-counter supplements including selenium, L-arginine, and magnesium.  09/12/2015, patient is feeling well, apart from an urgent cholecystectomy on 01/23/15 when  he developed acute on chronic systolic CHF postsurgery he has been pretty stable. Post surgery he gained 15 pounds that he lost but Lasix but since then gained another 6 pounds. He states that he uses Lasix on and off approximately once a week. Denies any orthopnea or paroxysmal nocturnal dyspnea. No chest pain no palpitations or syncope. He is compliant with his medicines. He is complaining of significant postnasal drainage and congested sinuses that are leading to voice changes.  Studies:  - LHC (12/2002):  No significant CAD, EF 30-35% (done after CLite demonstrated poss inf ischemia).  - Echo (09/22/13):  EF 35-40%, inferolateral and inf HK, Gr 2 DD, mild LAE.   - Nuclear (09/23/13):  Poss anteroseptal infarct, no ischemia, EF 48%; Low Risk  Recent Labs: 01/28/2015: Hemoglobin 9.8* 02/28/2015: ALT 9; Creat 1.65*; Potassium 4.15/10/2013:  K 4.7, creatinine 1.9, Hgb 16.1  Wt Readings from Last 3 Encounters:  09/12/15 203 lb (92.08 kg)  02/28/15 196 lb (88.905 kg)  01/28/15 220 lb 12.8 oz (100.154 kg)     Past Medical History  Diagnosis Date  . Hyperlipidemia   . Hypertension   . Diabetes mellitus   . GERD (gastroesophageal reflux disease)   . NICM (nonischemic cardiomyopathy) ([place])     a. EF 35-40% in 2009, 55% in 2010. b. 09/2013: 35-40% by echo and 48% by nuc.  . Coronary artery disease     a. Nonobstructive by cath 2004. b. Nuc 09/2013 - no ischemia, fixed defect suggestive of possible prior infarction, felt low risk, EF  48%.   . Premature ventricular contractions   . Atrial fibrillation ([place])     a. 09/2013: RVR then bradycardia then spont conv to NSR.  . CKD (chronic kidney disease), stage III   . 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 acetaminophen (TYLENOL) 325 MG tablet Take 2 tablets (650 mg total) by mouth every 6 (six) hours as needed for mild pain (or Fever >/= 101).    . Artificial Tear Ointment (DRY  EYES OP) Place 1 drop into both eyes daily as needed (for dry eyes).    [name] atorvastatin (LIPITOR) 40 MG tablet Take 40 mg by mouth daily at 6 PM.   3  . clonazePAM (KLONOPIN) 0.5 MG tablet Take 0.5 mg by mouth daily.  0  . Coenzyme Q10 (COQ10) 200 MG CAPS Take 100 mg by mouth daily.     [name] diltiazem (CARDIZEM CD) 120 MG 24 hr capsule Take 1 capsule (120 mg total) by mouth daily. 90 capsule 3  . furosemide (LASIX) 40 MG tablet Take 1 tablet (40 mg total) by mouth daily as needed for edema. 90 tablet 3  . metoprolol (LOPRESSOR) 50 MG tablet Take 1.5 tablets (75 mg total) by mouth 2 (two) times daily. 90 tablet 6  . omeprazole (PRILOSEC) 10 MG capsule Take 20 mg by mouth daily.     [name] oxyCODONE (OXY IR/ROXICODONE) 5 MG immediate release tablet Take 1 tablet (5 mg total) by mouth every 6 (six) hours as needed for severe pain. 30 tablet 0  . Rivaroxaban (XARELTO) 15 MG TABS tablet Take 1 tablet (15 mg total) by mouth daily with supper. 30 tablet 6  . tamsulosin (FLOMAX) 0.4 MG CAPS capsule Take 0.4 mg by mouth at bedtime.    [name] SOLOSTAR 300 UNIT/ML SOPN Inject 18 Units into the skin at bedtime.  3   No current facility-administered medications for this visit.    Allergies:   Claritin and Tradjenta   Social History:  The patient  reports that he quit smoking about 30 years ago. His smoking use included Cigarettes. He has a 5 pack-year smoking history. He does not have any smokeless tobacco history on file. He reports that he does not use illicit drugs.   Family History:  The patient's family history includes Diabetes in his brother and mother; Heart disease in his brother, father, and mother; Hypertension in his brother and mother. There is no history of Heart attack or Stroke.   ROS:  Please see the history of present illness.   He denies bleeding problems.   All other systems reviewed and negative.   PHYSICAL EXAM: VS:  BP 128/74 mmHg  Pulse 53  Ht 5\' 10"  (1.778 m)  Wt 203 lb (92.08 kg)   BMI 29.13 kg/m2 Well nourished, well developed, in no acute distress HEENT: normal Neck: no JVD Cardiac:  normal S1, S2; RRR; no murmur Lungs:  clear to auscultation bilaterally, no wheezing, rhonchi or rales Abd: soft, nontender, no hepatomegaly Ext: Mild bilateral lower extremity edema around ankles. Skin: warm and dry Neuro:  CNs 2-12 intact, no focal abnormalities noted  EKG:  NSR, HR 74, normal axis, PVCs, nonspecific ST-T wave changes     ECHO: 12/10/2013  - Left ventricle: The cavity size was moderately dilated. Wall thickness was normal. Systolic function was normal. The estimated ejection fraction was in the range of 50% to 55%. - Left atrium: The  atrium was mildly dilated.  TTE: 01/23/15 - Left ventricle: The cavity size was normal. Systolic function was normal. The estimated ejection fraction was in the range of 50% to 55%. There is hypokinesis of the basal-midinferior myocardium. Doppler parameters are consistent with abnormal left ventricular relaxation (grade 1 diastolic dysfunction). - Ventricular septum: Septal motion showed paradox. - Mitral valve: Mildly calcified annulus. There was mild regurgitation. - Left atrium: The atrium was mildly dilated.  Impressions:  - Compared to the prior study, there has been no significant interval change.  EKG performed today 09/12/2015 showed sinus bradycardia and nonspecific ST-T wave changes to assist change from last in August 2016 when his EKG showed A. fib with RVR.   ASSESSMENT AND PLAN:  1. Acute on chronic systolic CHF - the patient has [place] of mild fluid normal advised to use Lasix 40 mg po daily for the next 3 days and continue just when necessary.  2. Atrial fibrillation:  He remains in NSR.  PAF post op. He is tolerating Xarelto.    Continue current dose of beta blocker, and dilatiazem 120 mg CD.  3. Coronary artery disease:  Presumed given WMA on echo and scar on Nuclear study, however he is  asymptomatic.  Continue med Rx.  He is not on ASA as he is on Xarelto.  Continue beta blocker, statin.   4.   NICM (nonischemic cardiomyopathy):  His EF has improved to 50-55% with lisinopril, discontinued for worsening CK D stage III, last in 10/16 1.6. Discontinue lasix daily and start only PRN.    5.   Hypertension:  Controlled.   6.  HLP:  Continue statin.   7.  CKD (chronic kidney disease), stage III:  As above.    8. Chronic sinusitis, postnasal drip - we'll prescribe Flonase. Follow up in 6 months.    [name]  09/12/2015 2:09 PM

## 2015-09-26 ENCOUNTER — Ambulatory Visit (HOSPITAL_COMMUNITY)
Admission: EM | Admit: 2015-09-26 | Discharge: 2015-09-26 | Disposition: A | Discharge status: Home / Self Care | Payer: Medicare Other | Attending: Family Medicine | Admitting: Family Medicine

## 2015-09-26 ENCOUNTER — Encounter (HOSPITAL_COMMUNITY): Payer: Self-pay | Admitting: Emergency Medicine

## 2015-09-26 DIAGNOSIS — W57XXXA Bitten or stung by nonvenomous insect and other nonvenomous arthropods, initial encounter: Secondary | ICD-10-CM

## 2015-09-26 DIAGNOSIS — T148 Other injury of unspecified body region: Secondary | ICD-10-CM

## 2015-09-26 NOTE — ED Provider Notes (Signed)
CSN: [id number]     Arrival date & time 09/26/15  1949 History   First MD Initiated Contact with Patient 09/26/15 2057     Chief Complaint  Patient presents with  . Insect Bite   (Consider location/radiation/quality/duration/timing/severity/associated sxs/prior Treatment) HPI Comments: 75 year old male presents to the urgent care concerned about 2 insect bites, one to the left arm and one to the right arm. He was taking his petdog and cat outdoors and it was shortly after placing the cat down thehe noticed itching on these 2 areas. Denies systemic symptoms. Denies problems with breathing, cough, swelling or rash.   Past Medical History  Diagnosis Date  . Hyperlipidemia   . Hypertension   . Diabetes mellitus   . GERD (gastroesophageal reflux disease)   . NICM (nonischemic cardiomyopathy) ([place])     a. EF 35-40% in 2009, 55% in 2010. b. 09/2013: 35-40% by echo and 48% by nuc.  . Coronary artery disease     a. Nonobstructive by cath 2004. b. Nuc 09/2013 - no ischemia, fixed defect suggestive of possible prior infarction, felt low risk, EF 48%.   . Premature ventricular contractions   . Atrial fibrillation ([place])     a. 09/2013: RVR then bradycardia then spont conv to NSR.  . CKD (chronic kidney disease), stage III   . 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 Cardiac catheterization  10/25/00, 12/29/02  . Shoulder surgery Left 2011    Shoulder surgery, rod inserted  . Cholecystectomy N/A 01/25/2015    Procedure: LAPAROSCOPIC CHOLECYSTECTOMY ;  Surgeon: [name], MD;  Location: [facility] OR;  Service: General;  Laterality: N/A;   Family History  Problem Relation Age of Onset  . Heart disease Father   . Heart disease Mother   . Diabetes Mother   . Heart disease Brother   . Diabetes Brother   . Hypertension Mother   . Hypertension Brother   . Heart attack Neg Hx   . Stroke Neg Hx    Social History  Substance Use Topics  . Smoking  status: Former Smoker -- 0.50 packs/day for 10 years    Types: Cigarettes    Quit date: 01/21/1985  . Smokeless tobacco: Not on file     Comment: quit 35 yrs ago  . Alcohol Use: Not on file    Review of Systems  Constitutional: Negative.   HENT: Negative.   Respiratory: Negative.  Negative for cough and shortness of breath.   Cardiovascular: Negative for chest pain and palpitations.  Gastrointestinal: Negative.   Musculoskeletal: Negative.   Skin: Positive for color change. Negative for rash.  Neurological: Negative.   All other systems reviewed and are negative.   Allergies  Claritin and Tradjenta  Home Medications   Prior to Admission medications   Medication Sig Start Date End Date Taking? Authorizing Provider  acetaminophen (TYLENOL) 325 MG tablet Take 2 tablets (650 mg total) by mouth every 6 (six) hours as needed for mild pain (or Fever >/= 101). 01/29/15   Albertine Patricia, MD  Artificial Tear Ointment (DRY EYES OP) Place 1 drop into both eyes daily as needed (for dry eyes).    Historical Provider, MD  atorvastatin (LIPITOR) 40 MG tablet Take 40 mg by mouth daily at 6 PM.  12/26/14   Historical Provider, MD  clonazePAM (KLONOPIN) 0.5 MG tablet Take 0.5 mg by mouth daily. 01/02/15   Historical Provider, MD  Coenzyme Q10 (  COQ10) 200 MG CAPS Take 100 mg by mouth daily.     Historical Provider, MD  diltiazem (CARDIZEM CD) 120 MG 24 hr capsule Take 1 capsule (120 mg total) by mouth daily. 02/28/15   [name], MD  fluticasone (FLONASE) 50 MCG/ACT nasal spray Place 2 sprays into both nostrils daily. 09/12/15   [name], MD  furosemide (LASIX) 40 MG tablet Take 1 tablet (40 mg total) by mouth daily as needed for edema. 02/28/15   [name], MD  metoprolol (LOPRESSOR) 50 MG tablet Take 1.5 tablets (75 mg total) by mouth 2 (two) times daily. 03/15/15   [name], MD  omeprazole (PRILOSEC) 10 MG capsule Take 20 mg by mouth daily.     Historical Provider, MD   oxyCODONE (OXY IR/ROXICODONE) 5 MG immediate release tablet Take 1 tablet (5 mg total) by mouth every 6 (six) hours as needed for severe pain. 01/29/15   Silver Huguenin Elgergawy, MD  Rivaroxaban (XARELTO) 15 MG TABS tablet Take 1 tablet (15 mg total) by mouth daily with supper. 07/18/15   [name], MD  tamsulosin (FLOMAX) 0.4 MG CAPS capsule Take 0.4 mg by mouth at bedtime.    Historical Provider, MD  TOUJEO SOLOSTAR 300 UNIT/ML SOPN Inject 18 Units into the skin at bedtime. 01/29/15   Albertine Patricia, MD   Meds Ordered and Administered this Visit  Medications - No data to display  There were no vitals taken for this visit. No data found.   Physical Exam  Constitutional: He is oriented to person, place, and time. He appears well-developed and well-nourished. No distress.  Eyes: EOM are normal.  Neck: Normal range of motion. Neck supple.  Cardiovascular: Normal rate, regular rhythm and normal heart sounds.   Pulmonary/Chest: Effort normal. No respiratory distress. He has no wheezes.  Musculoskeletal: Normal range of motion. He exhibits no edema.  Neurological: He is alert and oriented to person, place, and time. He exhibits normal muscle tone.  Skin: Skin is warm and dry. No rash noted.  There is a 1 cm, soft, slightly raised, annular lesion of a pink color to the left wrist radial aspect.  There is a similar lesion in size and shape to the right proximal forearm ulnar aspect. At onset there was an immediate sligtly red streak flowing medially but faded away a couple of hours later.  No tenderness, local erythema, lymphangitis,or other signs of infection.  Psychiatric: He has a normal mood and affect.  Nursing note and vitals reviewed.   ED Course  Procedures (including critical care time)  Labs Review Labs Reviewed - No data to display  Imaging Review No results found.   Visual Acuity Review  Right Eye Distance:   Left Eye Distance:   Bilateral Distance:    Right Eye  Near:   Left Eye Near:    Bilateral Near:         MDM   1. Insect bite    Apply cortaid 1% (hydrocortisone) cream 3 times a day. Apply benadryl cream 4 times a day but at different times than the Cortaid cream Ice or cold packs over the bite site. Insect bite info and red flags.  Verbal and written discharge instructions have been reviewed and given to the patient  by the provider to include medications and other care measures.    Janne Napoleon, NP 09/26/15 2118

## 2015-09-26 NOTE — ED Notes (Signed)
Patient not seen by this nurse.

## 2015-09-26 NOTE — ED Notes (Signed)
See Janne Napoleon, NP notation

## 2015-09-26 NOTE — ED Notes (Signed)
Vitals not obtained on arrival to Urgent Care per livia , emt.  Patient then discharged by Janne Napoleon, NP .  No vital signs obtained.

## 2015-10-12 ENCOUNTER — Other Ambulatory Visit: Payer: Self-pay | Admitting: Cardiology

## 2016-03-16 ENCOUNTER — Other Ambulatory Visit: Payer: Self-pay | Admitting: Cardiology

## 2016-03-16 DIAGNOSIS — I10 Essential (primary) hypertension: Secondary | ICD-10-CM

## 2016-03-16 DIAGNOSIS — I480 Paroxysmal atrial fibrillation: Secondary | ICD-10-CM

## 2016-03-16 DIAGNOSIS — I5020 Unspecified systolic (congestive) heart failure: Secondary | ICD-10-CM

## 2016-03-19 ENCOUNTER — Encounter: Payer: Self-pay | Admitting: Cardiology

## 2016-03-29 ENCOUNTER — Ambulatory Visit (INDEPENDENT_AMBULATORY_CARE_PROVIDER_SITE_OTHER): Payer: Medicare Other | Admitting: Cardiology

## 2016-03-29 ENCOUNTER — Encounter: Payer: Self-pay | Admitting: Cardiology

## 2016-03-29 VITALS — BP 122/62 | HR 60 | Ht 70.0 in | Wt 211.0 lb

## 2016-03-29 DIAGNOSIS — I5043 Acute on chronic combined systolic (congestive) and diastolic (congestive) heart failure: Secondary | ICD-10-CM

## 2016-03-29 DIAGNOSIS — I428 Other cardiomyopathies: Secondary | ICD-10-CM

## 2016-03-29 DIAGNOSIS — E782 Mixed hyperlipidemia: Secondary | ICD-10-CM

## 2016-03-29 DIAGNOSIS — I10 Essential (primary) hypertension: Secondary | ICD-10-CM

## 2016-03-29 DIAGNOSIS — I5020 Unspecified systolic (congestive) heart failure: Secondary | ICD-10-CM

## 2016-03-29 DIAGNOSIS — I480 Paroxysmal atrial fibrillation: Secondary | ICD-10-CM | POA: Diagnosis not present

## 2016-03-29 MED ORDER — ATORVASTATIN CALCIUM 40 MG PO TABS: 40.0000 mg | ORAL_TABLET | Freq: Every day | ORAL | 3 refills | Status: DC

## 2016-03-29 MED ORDER — DILTIAZEM HCL ER COATED BEADS 120 MG PO CP24: ORAL_CAPSULE | ORAL | 3 refills | Status: DC

## 2016-03-29 MED ORDER — FUROSEMIDE 40 MG PO TABS: 40.0000 mg | ORAL_TABLET | Freq: Every day | ORAL | 3 refills | Status: DC | PRN

## 2016-03-29 MED ORDER — RIVAROXABAN 15 MG PO TABS: 15.0000 mg | ORAL_TABLET | Freq: Every day | ORAL | 6 refills | Status: DC

## 2016-03-29 NOTE — Progress Notes (Signed)
Patient ID: Erik [name], male   DOB: 06-Dec-1940, 75 y.o.   MRN: [id number]    Date:  03/29/2016   ID:  Erik [name], Erik [name] 01-01-1941, MRN [id number]  PCP:  [name], MD  Cardiologist:  Dr. Jenell Milliner => Dr. Ena Dawley     Chief complain: Follow-up for decrease in LVEF.  History of Present Illness: Erik [name] is a 75 y.o. male with a hx of no significant CAD on cath in 2004, PVCs, NICM (EF 35% in past and improved to 55% in 2010), CKD, DM, HTN, HL.  He was admitted 4/28-5/1 with chest discomfort in the setting of AFib with RVR. He converted to NSR on IV dilt. Cardiac enzymes remained normal.  D-dimer was elevated and VQ scan was low probability for pulmonary embolism. He was placed on Xarelto for anticoagulation (CHADS2-VASc=3).  Echocardiogram demonstrated worsening LV function with an EF of 35-40% and inferior and inferolateral hypokinesis. Patient was set up for a nuclear study as an inpatient. This demonstrated mid to apical anteroseptal perfusion defect suggestive of possible infarct but no ischemia, EF 48%. This was felt to be a low risk study. Decision was made to treat him medically.  Of note, on enalapril was stopped in the setting of CKD.  Carbamazepine was stopped due to interaction with Xarelto.  04/15/2014 - this is 5 months follow-up. At the last visit in June we found out that patient's ejection fraction dropped to 35-40%. He's ACE inhibitor was discontinued because of CK D stage III. We restarted lisinopril and his follow-up echo showed improvement of his ejection fraction to 50-55%. Patient states today that his functional capacity is good, he denies any angina or shortness of breath. He denies any palpitations. He's very compliant he with his medications. He has several correction regarding taking over-the-counter supplements including selenium, L-arginine, and magnesium.  03/29/2016 - the patient is coming after 6 months, he has been feeling very well, he didn't  need to use Lasix at all. His weight remained stable, he denies any orthopnea or paroxysmal nocturnal dyspnea, he has no lower extremity edema no palpitation no dizziness or syncope. He denies any chest pain. He's been compliant with his meds and denies any bleeding no black tarry stools, no muscle pain from statins, he has prostate issues and he is going to see an urologist this months.   Studies:  - LHC (12/2002):  No significant CAD, EF 30-35% (done after CLite demonstrated poss inf ischemia).  - Echo (09/22/13):  EF 35-40%, inferolateral and inf HK, Gr 2 DD, mild LAE.   - Nuclear (09/23/13):  Poss anteroseptal infarct, no ischemia, EF 48%; Low Risk  Recent Labs: No results found for requested labs within last 8760 hours.09/29/2013:  K 4.7, creatinine 1.9, Hgb 16.1  Wt Readings from Last 3 Encounters:  03/29/16 204 lb (92.5 kg)  09/12/15 203 lb (92.1 kg)  02/28/15 196 lb (88.9 kg)     Past Medical History:  Diagnosis Date  . Atrial fibrillation ([place])    a. 09/2013: RVR then bradycardia then spont conv to NSR.  . CKD (chronic kidney disease), stage III   . Coronary artery disease    a. Nonobstructive by cath 2004. b. Nuc 09/2013 - no ischemia, fixed defect suggestive of possible prior infarction, felt low risk, EF 48%.   . Diabetes mellitus   . GERD (gastroesophageal reflux disease)   . Hyperlipidemia   . Hypertension   . NICM (nonischemic cardiomyopathy) ([place])  a. EF 35-40% in 2009, 55% in 2010. b. 09/2013: 35-40% by echo and 48% by nuc.  . 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 Premature ventricular contractions    Current Outpatient Prescriptions  Medication Sig Dispense Refill  . acetaminophen (TYLENOL) 325 MG tablet Take 2 tablets (650 mg total) by mouth every 6 (six) hours as needed for mild pain (or Fever >/= 101).    . Artificial Tear Ointment (DRY EYES OP) Place 1 drop into both eyes daily as needed (for dry eyes).    [name] atorvastatin (LIPITOR) 40 MG tablet Take 40 mg by  mouth daily at 6 PM.   3  . carbamazepine (TEGRETOL) 100 MG chewable tablet CHEW AND SWALLOW 1 TABLET BY MOUTH TWICE DAILY FOR TRIGEMINAL PAIN.  1  . CARTIA XT 120 MG 24 hr capsule TAKE 1 CAPSULE(120 MG) BY MOUTH DAILY 90 capsule 1  . citalopram (CELEXA) 40 MG tablet Take 40 mg by mouth daily.  6  . clonazePAM (KLONOPIN) 0.5 MG tablet Take 0.5 mg by mouth daily as needed.   0  . Coenzyme Q10 (COQ10) 200 MG CAPS Take 100 mg by mouth daily.     . fluticasone (FLONASE) 50 MCG/ACT nasal spray Place 2 sprays into both nostrils daily.  2  . furosemide (LASIX) 40 MG tablet Take 1 tablet (40 mg total) by mouth daily as needed for edema. 90 tablet 3  . metoprolol (LOPRESSOR) 50 MG tablet TAKE 1 AND 1/2 TABLETS(75 MG) BY MOUTH TWICE DAILY 90 tablet 10  . omeprazole (PRILOSEC) 20 MG capsule Take 20 mg by mouth daily.    [name] oxyCODONE (OXY IR/ROXICODONE) 5 MG immediate release tablet Take 1 tablet (5 mg total) by mouth every 6 (six) hours as needed for severe pain. 30 tablet 0  . Rivaroxaban (XARELTO) 15 MG TABS tablet Take 1 tablet (15 mg total) by mouth daily with supper. 30 tablet 6  . tamsulosin (FLOMAX) 0.4 MG CAPS capsule Take 0.4 mg by mouth at bedtime.    [name] SOLOSTAR 300 UNIT/ML SOPN Inject 22 Units as directed daily.  4   No current facility-administered medications for this visit.    Allergies:   Claritin [loratadine] and Tradjenta [linagliptin]   Social History:  The patient  reports that he quit smoking about 31 years ago. His smoking use included Cigarettes. He has a 5.00 pack-year smoking history. He has never used smokeless tobacco. He reports that he does not use drugs.   Family History:  The patient's family history includes Diabetes in his brother and mother; Heart disease in his brother, father, and mother; Hypertension in his brother and mother.   ROS:  Please see the history of present illness.   He denies bleeding problems.   All other systems reviewed and negative.   PHYSICAL  EXAM: VS:  BP 122/62   Pulse 60   Ht 5\' 10"  (1.778 m)   Wt 204 lb (92.5 kg)   BMI 29.27 kg/m  Well nourished, well developed, in no acute distress  HEENT: normal  Neck: no JVD  Cardiac:  normal S1, S2; RRR; no murmur  Lungs:  clear to auscultation bilaterally, no wheezing, rhonchi or rales  Abd: soft, nontender, no hepatomegaly  Ext: No edema bilaterally.  Skin: warm and dry  Neuro:  CNs 2-12 intact, no focal abnormalities noted  EKG:  NSR, HR 74, normal axis, PVCs, nonspecific ST-T wave changes     ECHO: 12/10/2013  - Left  ventricle: The cavity size was moderately dilated. Wall thickness was normal. Systolic function was normal. The estimated ejection fraction was in the range of 50% to 55%. - Left atrium: The atrium was mildly dilated.  TTE: 01/23/15 - Left ventricle: The cavity size was normal. Systolic function was normal. The estimated ejection fraction was in the range of 50% to 55%. There is hypokinesis of the basal-midinferior myocardium. Doppler parameters are consistent with abnormal left ventricular relaxation (grade 1 diastolic dysfunction). - Ventricular septum: Septal motion showed paradox. - Mitral valve: Mildly calcified annulus. There was mild regurgitation. - Left atrium: The atrium was mildly dilated.  Impressions:  - Compared to the prior study, there has been no significant interval change.  EKG performed today 09/12/2015 showed sinus bradycardia and nonspecific ST-T wave changes to assist change from last in August 2016 when his EKG showed A. fib with RVR.     ASSESSMENT AND PLAN:  1. Acute on chronic systolic CHF - he is euvolemic he will continue take Lasix just as needed, he is labs are being followed by his PCP.   2. Atrial fibrillation:  He remains in NSR. No further episodes. No bleeding with Xarelto.  3. Coronary artery disease:  Presumed given WMA on echo and scar on Nuclear study, however he is asymptomatic.  Continue  med Rx.  He is not on ASA as he is on Xarelto.  Continue beta blocker, statin. No side effects from statin.  4.   NICM (nonischemic cardiomyopathy):  His EF has improved to 50-55% with lisinopril, discontinued for worsening CK D stage III, last in 10/16 1.6. He is well compensated and asymptomatic.  5.   Hypertension:  Controlled. Continue current regimen.  6.  HLP:  Continue statin. Labs followed by his primary care physician.  7.  CKD (chronic kidney disease), stage III:  As above.    Follow-up in 6 months.  Ena Dawley  03/29/2016 8:31 AM

## 2016-03-29 NOTE — Patient Instructions (Signed)

## 2016-04-04 ENCOUNTER — Other Ambulatory Visit: Payer: Self-pay | Admitting: Cardiology

## 2016-08-08 ENCOUNTER — Ambulatory Visit (INDEPENDENT_AMBULATORY_CARE_PROVIDER_SITE_OTHER): Payer: Medicare Other | Admitting: Neurology

## 2016-08-08 ENCOUNTER — Encounter: Payer: Self-pay | Admitting: Neurology

## 2016-08-08 VITALS — BP 100/60 | HR 60 | Ht 70.0 in | Wt 206.6 lb

## 2016-08-08 DIAGNOSIS — G500 Trigeminal neuralgia: Secondary | ICD-10-CM | POA: Diagnosis not present

## 2016-08-08 MED ORDER — CARBAMAZEPINE ER 200 MG PO TB12: 200.0000 mg | ORAL_TABLET | Freq: Two times a day (BID) | ORAL | 3 refills | Status: DC

## 2016-08-08 NOTE — Progress Notes (Signed)
Note routed

## 2016-08-08 NOTE — Progress Notes (Signed)
[facility] Clinic Note - Initial Visit   Date: 08/08/16  Erik [name] MRN: [id number] DOB: Mar 02, 1941   Dear Dr. Osborne Casco:  Thank you for your kind referral of Erik [name] for consultation of right trigeminal neuralgia. Although his history is well known to you, please allow [place] to reiterate it for the purpose of our medical record. The patient was accompanied to the clinic by self.    History of Present Illness: Erik [name] is a 76 y.o. right-handed Caucasian male with atrial fibrillation, hyperlipidemia, hypertension, GERD, CAD, CHF (EF 30%), diabetes mellitus, and BPH presenting for evaluation of trigeminal neuralgia.    Since 2000, he had had three spells of trigeminal neuralgia, the most recent episode started 6 months ago. He has extreme sensitivity of the right temple, jaw, cheek, lips, and nose on the right side.  It feels as if he is touching an electric fence.  Symptoms are intermittent and occur daily, lasting about 5-15 seconds.  If he tries to be still, he feels that it alleviates pain quicker.  His pain is exacerbated by brushing his teeth, combing hair, shampooing on the right scalp, and chewing. He was started on carbamazepine 100mg  twice daily and does not appreciate any improvement.  In the past, he used Tegretol and had symptoms resolve within 6 weeks.     Past Medical History:  Diagnosis Date  . Atrial fibrillation ([place])    a. 09/2013: RVR then bradycardia then spont conv to NSR.  . CKD (chronic kidney disease), stage III   . Coronary artery disease    a. Nonobstructive by cath 2004. b. Nuc 09/2013 - no ischemia, fixed defect suggestive of possible prior infarction, felt low risk, EF 48%.   . Diabetes mellitus   . GERD (gastroesophageal reflux disease)   . Hyperlipidemia   . Hypertension   . NICM (nonischemic cardiomyopathy) ([place])    a. EF 35-40% in 2009, 55% in 2010. b. 09/2013: 35-40% by echo and 48% by nuc.  . Pain due to  neuropathy of facial nerve   . Premature ventricular contractions     Past Surgical History:  Procedure Laterality Date  . CARDIAC CATHETERIZATION  10/25/00, 12/29/02  . CHOLECYSTECTOMY N/A 01/25/2015   Procedure: LAPAROSCOPIC CHOLECYSTECTOMY ;  Surgeon: [name], MD;  Location: [facility];  Service: General;  Laterality: N/A;  . SHOULDER SURGERY Left 2011   Shoulder surgery, rod inserted     Medications:  Outpatient Encounter Prescriptions as of 08/08/2016  Medication Sig  . acetaminophen (TYLENOL) 325 MG tablet Take 2 tablets (650 mg total) by mouth every 6 (six) hours as needed for mild pain (or Fever >/= 101).  . Artificial Tear Ointment (DRY EYES OP) Place 1 drop into both eyes daily as needed (for dry eyes).  [name] atorvastatin (LIPITOR) 40 MG tablet Take 1 tablet (40 mg total) by mouth daily at 6 PM.  . carbamazepine (TEGRETOL) 100 MG chewable tablet CHEW AND SWALLOW 1 TABLET BY MOUTH TWICE DAILY FOR TRIGEMINAL PAIN.  . citalopram (CELEXA) 40 MG tablet Take 40 mg by mouth daily.  . clonazePAM (KLONOPIN) 0.5 MG tablet Take 0.5 mg by mouth daily as needed.   . Coenzyme Q10 (COQ10) 200 MG CAPS Take 100 mg by mouth daily.   [name] diltiazem (CARTIA XT) 120 MG 24 hr capsule TAKE 1 CAPSULE(120 MG) BY MOUTH DAILY  . fluticasone (FLONASE) 50 MCG/ACT nasal spray Place 2 sprays into both nostrils daily.  . furosemide (LASIX) 40  MG tablet Take 1 tablet (40 mg total) by mouth daily as needed for edema.  . metoprolol (LOPRESSOR) 50 MG tablet TAKE 1 AND 1/2 TABLETS(75 MG) BY MOUTH TWICE DAILY  . MYRBETRIQ 25 MG TB24 tablet TK 1 T PO QD  . omeprazole (PRILOSEC) 20 MG capsule Take 20 mg by mouth daily.  [name] oxyCODONE (OXY IR/ROXICODONE) 5 MG immediate release tablet Take 1 tablet (5 mg total) by mouth every 6 (six) hours as needed for severe pain.  . Rivaroxaban (XARELTO) 15 MG TABS tablet Take 1 tablet (15 mg total) by mouth daily with supper.  . tamsulosin (FLOMAX) 0.4 MG CAPS capsule Take 0.4 mg by  mouth at bedtime.  [name] SOLOSTAR 300 UNIT/ML SOPN Inject 22 Units as directed daily.   No facility-administered encounter medications on file as of 08/08/2016.      Allergies:  Allergies  Allergen Reactions  . Claritin [Loratadine] Other (See Comments)    Urinary retention  . Tradjenta [Linagliptin] Other (See Comments)    High blood sugar    Family History: Family History  Problem Relation Age of Onset  . Heart disease Mother   . Diabetes Mother   . Hypertension Mother   . Heart disease Father   . Heart disease Brother   . Diabetes Brother   . Hypertension Brother   . Heart attack Neg Hx   . Stroke Neg Hx     Social History: Social History  Substance Use Topics  . Smoking status: Former Smoker    Packs/day: 0.50    Years: 10.00    Types: Cigarettes    Quit date: 01/21/1985  . Smokeless tobacco: Never Used     Comment: quit 35 yrs ago  . Alcohol use Not on file   Social History   Social History Narrative   Lives with wife in a one story home.  Has 3 children.  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  Education: high school.    Review of Systems:  CONSTITUTIONAL: No fevers, chills, night sweats, or weight loss.   EYES: No visual changes or eye pain +facial pain ENT: No hearing changes.  No history of nose bleeds.   RESPIRATORY: No cough, wheezing and shortness of breath.   CARDIOVASCULAR: Negative for chest pain, and palpitations.   GI: Negative for abdominal discomfort, blood in stools or black stools.  No recent change in bowel habits.   GU:  No history of incontinence.   MUSCLOSKELETAL: No history of joint pain or swelling.  No myalgias.   SKIN: Negative for lesions, rash, and itching.   HEMATOLOGY/ONCOLOGY: Negative for prolonged bleeding, bruising easily, and swollen nodes.  ENDOCRINE: Negative for cold or heat intolerance, polydipsia or goiter.   PSYCH:  No depression or anxiety symptoms.   NEURO: As Above.   Vital Signs:  BP 100/60    Pulse 60   Ht 5\' 10"  (1.778 m)   Wt 206 lb 9 oz (93.7 kg)   SpO2 96%   BMI 29.64 kg/m    General Medical Exam:   General:  Well appearing, comfortable.   Eyes/ENT: see cranial nerve examination.   Neck: No masses appreciated.  Full range of motion without tenderness.  No carotid bruits. Respiratory:  Clear to auscultation, good air entry bilaterally.   Cardiac:  Regular rate and rhythm, no murmur.   Extremities:  No deformities, edema, or skin discoloration.  Skin:  No rashes or lesions.  Neurological Exam: MENTAL STATUS including orientation to time,  place, person, recent and remote memory, attention span and concentration, language, and fund of knowledge is normal.  Speech is not dysarthric.  CRANIAL NERVES: II:  No visual field defects.  Unremarkable fundi.   III-IV-VI: Pupils equal round and reactive to light.  Normal conjugate, extra-ocular eye movements in all directions of gaze.  No nystagmus.  No ptosis.   V:  Normal facial sensation.    There is no hypersensitivity of the face to pin prick, light touch, or temperature. VII:  Normal facial symmetry and movements.  VIII:  Normal hearing and vestibular function.   IX-X:  Normal palatal movement.   XI:  Normal shoulder shrug and head rotation.   XII:  Normal tongue strength and range of motion, no deviation or fasciculation.  MOTOR:  Motor strength is 5/5 throughout.  No atrophy, fasciculations or abnormal movements.  No pronator drift.  Tone is normal.   MSRs:  Right                                                                 Left brachioradialis 2+  brachioradialis 2+  biceps 2+  biceps 2+  triceps 2+  triceps 2+  patellar 2+  patellar 2+  ankle jerk 2+  ankle jerk 2+  Hoffman no  Hoffman no  plantar response down  plantar response down   SENSORY:  Normal and symmetric perception of light touch, pinprick, and vibration.  COORDINATION/GAIT: Normal finger-to- nose-finger and heel-to-shin.  Intact rapid alternating  movements bilaterally.  Gait narrow based and stable. Tandem and stressed gait intact.    IMPRESSION: Mr. Hinojos is a 76 year-old male referred for evaluation of right trigeminal neuralgia.  His exam is normal.  He has not had prior imaging because his symptoms responded well to Tegretol in the past.  I discussed that we can order MRI brain going forward, if no improvement to look for vascular compression of the nerve.  In the meantime, I will optimize the dose of his Tegretol and increase to 200mg  twice daily (brand preferred).  Side effects discussed.  If he develops any rash, patient notified to stop medication immediately.  Return to clinic in 2 months.  The duration of this appointment visit was 40 minutes of face-to-face time with the patient.  Greater than 50% of this time was spent in counseling, explanation of diagnosis, planning of further management, and coordination of care.   Thank you for allowing me to participate in patient's care.  If I can answer any additional questions, I would be pleased to do so.    Sincerely,    Noble Cicalese K. Posey Pronto, DO

## 2016-08-08 NOTE — Patient Instructions (Addendum)
1.  Start Tegretol 200mg  twice daily 2.  If you develop any rash, stop medication immediately and contact your provider 3.  Please call with an update in 4 weeks  Return to clinic in 2 months

## 2016-08-09 ENCOUNTER — Telehealth: Payer: Self-pay | Admitting: Neurology

## 2016-08-09 NOTE — Telephone Encounter (Signed)
Patient called to let Dr. Posey Pronto know that his insurance will not pay for the Tegretol.  What is he to do now?

## 2016-08-12 NOTE — Telephone Encounter (Signed)
Patient notified that insurance has approved the medication.  He said that he will still have to pay $166 the first month then it will come down to $71.

## 2016-09-10 ENCOUNTER — Telehealth: Payer: Self-pay | Admitting: Neurology

## 2016-09-10 NOTE — Telephone Encounter (Signed)
Please have him reduce it to Tegretol 200 mg at bedtime only for one week, if pain continues to be well controlled, okay to stop.  Donika K. Posey Pronto, DO

## 2016-09-10 NOTE — Telephone Encounter (Signed)
Received a call from Patient regarding medication: .  Patient needs a refill of medication. No  Patient having side effects from medication. No  Patient calling to update [place] on medication. Tegretol is  effecting another medication that he is taking that could cause a stroke. He would like you to please call him. Thanks

## 2016-09-10 NOTE — Telephone Encounter (Signed)
Patient said the pharmacist told him that the tegretol could affect his xarelto.  He said that the tegretol has helped his pain and it is about 95% gone.  He would like to stop the tegretol if possible.  Please advise.

## 2016-09-10 NOTE — Telephone Encounter (Signed)
Patient given instructions and agreed with plan.  

## 2016-09-15 ENCOUNTER — Other Ambulatory Visit: Payer: Self-pay | Admitting: Cardiology

## 2016-09-16 ENCOUNTER — Telehealth: Payer: Self-pay | Admitting: Neurology

## 2016-09-16 ENCOUNTER — Encounter: Payer: Self-pay | Admitting: *Deleted

## 2016-09-16 NOTE — Telephone Encounter (Signed)
Please advise.  The last phone note says that you decreased the tegretol.

## 2016-09-16 NOTE — Telephone Encounter (Signed)
Called patient back.  No answer and no voicemail.

## 2016-09-16 NOTE — Telephone Encounter (Signed)
Sent patient a message via MyChart.

## 2016-09-16 NOTE — Telephone Encounter (Signed)
Please instruct him to increase carbamazepine to 200mg  twice daily.  Tyshae Stair K. Posey Pronto, DO

## 2016-09-16 NOTE — Telephone Encounter (Signed)
PT called and said jaw pain is back and wants to know what he can take/Dawn

## 2016-09-23 ENCOUNTER — Telehealth: Payer: Self-pay | Admitting: Neurology

## 2016-09-23 ENCOUNTER — Encounter: Payer: Self-pay | Admitting: *Deleted

## 2016-09-23 NOTE — Telephone Encounter (Signed)
VM-PT left a message stating he needed something for pain/Dawn

## 2016-09-23 NOTE — Telephone Encounter (Signed)
Attempted to contact patient.  No voicemail and no answer.

## 2016-09-24 ENCOUNTER — Other Ambulatory Visit: Payer: Self-pay | Admitting: *Deleted

## 2016-09-24 MED ORDER — GABAPENTIN 300 MG PO CAPS: 300.0000 mg | ORAL_CAPSULE | Freq: Two times a day (BID) | ORAL | 5 refills | Status: DC

## 2016-09-24 NOTE — Telephone Encounter (Signed)
Patient given instructions and Rx sent.

## 2016-09-24 NOTE — Telephone Encounter (Signed)
Let's start gabapentin 300mg  at bedtime x 3 days, then increase to 300mg  twice daily.    Reduce carbamazepine to 200mg  daily x 1 week, then stop.  Donika K. Posey Pronto, DO

## 2016-09-27 ENCOUNTER — Ambulatory Visit: Payer: Medicare Other | Admitting: Neurology

## 2016-10-10 ENCOUNTER — Telehealth: Payer: Self-pay | Admitting: *Deleted

## 2016-10-10 NOTE — Telephone Encounter (Signed)
Patient called and stated that his xarelto co-pay has went from $45 to $187.51. He is in the donut hole. He is out of medication and does not have the money right now to refill it. I will provide him with one month of samples. Patient was very appreciative and stated that he would pick it up at the pharmacy next month. He is aware that we will not be able to provide him samples monthly for the remainder of the year. He is not really interested in applying for patient assistance at this time.

## 2016-10-11 ENCOUNTER — Ambulatory Visit: Payer: Medicare Other | Admitting: Neurology

## 2016-10-31 ENCOUNTER — Ambulatory Visit (INDEPENDENT_AMBULATORY_CARE_PROVIDER_SITE_OTHER): Payer: Medicare Other | Admitting: Cardiology

## 2016-10-31 VITALS — BP 130/56 | HR 59 | Ht 70.0 in | Wt 211.0 lb

## 2016-10-31 DIAGNOSIS — I480 Paroxysmal atrial fibrillation: Secondary | ICD-10-CM

## 2016-10-31 DIAGNOSIS — I5043 Acute on chronic combined systolic (congestive) and diastolic (congestive) heart failure: Secondary | ICD-10-CM

## 2016-10-31 DIAGNOSIS — E782 Mixed hyperlipidemia: Secondary | ICD-10-CM

## 2016-10-31 DIAGNOSIS — I428 Other cardiomyopathies: Secondary | ICD-10-CM | POA: Diagnosis not present

## 2016-10-31 DIAGNOSIS — I10 Essential (primary) hypertension: Secondary | ICD-10-CM | POA: Diagnosis not present

## 2016-10-31 DIAGNOSIS — Z7901 Long term (current) use of anticoagulants: Secondary | ICD-10-CM

## 2016-10-31 NOTE — Patient Instructions (Signed)

## 2016-10-31 NOTE — Progress Notes (Signed)
Patient ID: Erik [name], male   DOB: Aug 16, 1940, 76 y.o.   MRN: [id number]    Date:  10/31/2016   ID:  Erik, [name] 01-26-1941, MRN [id number]  PCP:  [name], MD  Cardiologist:  Dr. Jenell Milliner => Dr. Ena Dawley     Chief complain: Follow-up for decrease in LVEF.  History of Present Illness: Erik [name] is a 76 y.o. male with a hx of no significant CAD on cath in 2004, PVCs, NICM (EF 35% in past and improved to 55% in 2010), CKD, DM, HTN, HL.  He was admitted 4/28-5/1 with chest discomfort in the setting of AFib with RVR. He converted to NSR on IV dilt. Cardiac enzymes remained normal.  D-dimer was elevated and VQ scan was low probability for pulmonary embolism. He was placed on Xarelto for anticoagulation (CHADS2-VASc=3).  Echocardiogram demonstrated worsening LV function with an EF of 35-40% and inferior and inferolateral hypokinesis. Patient was set up for a nuclear study as an inpatient. This demonstrated mid to apical anteroseptal perfusion defect suggestive of possible infarct but no ischemia, EF 48%. This was felt to be a low risk study. Decision was made to treat him medically.  Of note, on enalapril was stopped in the setting of CKD.  Carbamazepine was stopped due to interaction with Xarelto.  04/15/2014 - this is 5 months follow-up. At the last visit in June we found out that patient's ejection fraction dropped to 35-40%. He's ACE inhibitor was discontinued because of CK D stage III. We restarted lisinopril and his follow-up echo showed improvement of his ejection fraction to 50-55%. Patient states today that his functional capacity is good, he denies any angina or shortness of breath. He denies any palpitations. He's very compliant he with his medications. He has several correction regarding taking over-the-counter supplements including selenium, L-arginine, and magnesium.  03/29/2016 - the patient is coming after 6 months, he has been feeling very well, he didn't  need to use Lasix at all. His weight remained stable, he denies any orthopnea or paroxysmal nocturnal dyspnea, he has no lower extremity edema no palpitation no dizziness or syncope. He denies any chest pain. He's been compliant with his meds and denies any bleeding no black tarry stools, no muscle pain from statins, he has prostate issues and he is going to see an urologist this months.  10/31/2016 - 6 months follow-up, the patient states that he symptoms haven't changed, he denies any chest pain shortness of breath no palpitation or dizziness presyncope or syncope. He experiences no claudications. He denies any lower extremity edema orthopnea or paroxysmal nocturnal dyspnea. His only complain is high co-pay with his medications for example is Xarelto recently went from $40-$180 per months.  Studies:  - LHC (12/2002):  No significant CAD, EF 30-35% (done after CLite demonstrated poss inf ischemia).  - Echo (09/22/13):  EF 35-40%, inferolateral and inf HK, Gr 2 DD, mild LAE.   - Nuclear (09/23/13):  Poss anteroseptal infarct, no ischemia, EF 48%; Low Risk  Recent Labs: No results found for requested labs within last 8760 hours.09/29/2013:  K 4.7, creatinine 1.9, Hgb 16.1  Wt Readings from Last 3 Encounters:  10/31/16 211 lb (95.7 kg)  08/08/16 206 lb 9 oz (93.7 kg)  03/29/16 211 lb (95.7 kg)     Past Medical History:  Diagnosis Date  . Atrial fibrillation ([place])    a. 09/2013: RVR then bradycardia then spont conv to NSR.  . CKD (chronic kidney  disease), stage III   . Coronary artery disease    a. Nonobstructive by cath 2004. b. Nuc 09/2013 - no ischemia, fixed defect suggestive of possible prior infarction, felt low risk, EF 48%.   . Diabetes mellitus   . GERD (gastroesophageal reflux disease)   . Hyperlipidemia   . Hypertension   . NICM (nonischemic cardiomyopathy) ([place])    a. EF 35-40% in 2009, 55% in 2010. b. 09/2013: 35-40% by echo and 48% by nuc.  . 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 Premature ventricular contractions    Current Outpatient Prescriptions  Medication Sig Dispense Refill  . acetaminophen (TYLENOL) 325 MG tablet Take 2 tablets (650 mg total) by mouth every 6 (six) hours as needed for mild pain (or Fever >/= 101).    . Artificial Tear Ointment (DRY EYES OP) Place 1 drop into both eyes daily as needed (for dry eyes).    [name] atorvastatin (LIPITOR) 40 MG tablet Take 1 tablet (40 mg total) by mouth daily at 6 PM. 90 tablet 3  . citalopram (CELEXA) 40 MG tablet Take 40 mg by mouth daily.  6  . clonazePAM (KLONOPIN) 0.5 MG tablet Take 0.5 mg by mouth daily as needed.   0  . Coenzyme Q10 (COQ10) 200 MG CAPS Take 100 mg by mouth daily.     [name] diltiazem (CARTIA XT) 120 MG 24 hr capsule TAKE 1 CAPSULE(120 MG) BY MOUTH DAILY 90 capsule 3  . fluticasone (FLONASE) 50 MCG/ACT nasal spray Place 2 sprays into both nostrils daily.  2  . furosemide (LASIX) 40 MG tablet Take 1 tablet (40 mg total) by mouth daily as needed for edema. 90 tablet 3  . metoprolol (LOPRESSOR) 50 MG tablet TAKE 1 AND 1/2 TABLETS(75 MG) BY MOUTH TWICE DAILY 90 tablet 6  . MYRBETRIQ 25 MG TB24 tablet TK 1 T PO QD  11  . omeprazole (PRILOSEC) 20 MG capsule Take 20 mg by mouth daily.    [name] oxyCODONE (OXY IR/ROXICODONE) 5 MG immediate release tablet Take 1 tablet (5 mg total) by mouth every 6 (six) hours as needed for severe pain. 30 tablet 0  . Rivaroxaban (XARELTO) 15 MG TABS tablet Take 1 tablet (15 mg total) by mouth daily with supper. 30 tablet 6  . tamsulosin (FLOMAX) 0.4 MG CAPS capsule Take 0.4 mg by mouth at bedtime.    [name] SOLOSTAR 300 UNIT/ML SOPN Inject 22 Units as directed daily.  4   No current facility-administered medications for this visit.    Allergies:   Claritin [loratadine] and Tradjenta [linagliptin]   Social History:  The patient  reports that he quit smoking about 31 years ago. His smoking use included Cigarettes. He has a 5.00 pack-year smoking history. He has never used  smokeless tobacco. He reports that he does not drink alcohol or use drugs.   Family History:  The patient's family history includes Diabetes in his brother and mother; Heart disease in his brother, father, and mother; Hypertension in his brother and mother.   ROS:  Please see the history of present illness.   He denies bleeding problems.   All other systems reviewed and negative.   PHYSICAL EXAM: VS:  BP (!) 130/56   Pulse (!) 59   Ht 5\' 10"  (1.778 m)   Wt 211 lb (95.7 kg)   SpO2 94%   BMI 30.28 kg/m  Well nourished, well developed, in no acute distress  HEENT: normal  Neck:  no JVD  Cardiac:  normal S1, S2; RRR; no murmur  Lungs:  Mild crackles at the right base, no wheezing, rhonchi or rales  Abd: soft, nontender, no hepatomegaly  Ext: Trivial edema bilaterally.  Skin: warm and dry  Neuro:  CNs 2-12 intact, no focal abnormalities noted  EKG:  NSR, HR 74, normal axis, PVCs, nonspecific ST-T wave changes     ECHO: 12/10/2013  - Left ventricle: The cavity size was moderately dilated. Wall thickness was normal. Systolic function was normal. The estimated ejection fraction was in the range of 50% to 55%. - Left atrium: The atrium was mildly dilated.  TTE: 01/23/15 - Left ventricle: The cavity size was normal. Systolic function was normal. The estimated ejection fraction was in the range of 50% to 55%. There is hypokinesis of the basal-midinferior myocardium. Doppler parameters are consistent with abnormal left ventricular relaxation (grade 1 diastolic dysfunction). - Ventricular septum: Septal motion showed paradox. - Mitral valve: Mildly calcified annulus. There was mild regurgitation. - Left atrium: The atrium was mildly dilated.  Impressions:  - Compared to the prior study, there has been no significant interval change.  EKG performed today 10/31/2016 showed wandering pacemaker with 3 different morphology P waves and ventricular rate 52 bpm unchanged from  prior.    ASSESSMENT AND PLAN:  1. Acute on chronic systolic CHF - he is mildly fluid overloaded, he admits to not following a low-salt diet he is educated about that and advised to take Lasix 40 mg today and tomorrow   2. Atrial fibrillation:  He remains in NSR. No further episodes. No bleeding with Xarelto. I have asked our pharmacist about possibly switching to Eliquis, however he is in the donut hole and payment wouldn't change, he should eventually following to catastrophic insurance and his co-pay should be lower anterior he is also searching into alternative insurance companies.  3. Coronary artery disease:  Presumed given WMA on echo and scar on Nuclear study, however he is asymptomatic.  Continue med Rx.  He is not on ASA as he is on Xarelto.  Continue beta blocker, statin. No side effects from statin.  4.   NICM (nonischemic cardiomyopathy):  His EF has improved to 50-55% with lisinopril, discontinued for worsening CK D stage III, last in 10/16 1.6. He is well compensated and asymptomatic. NYHA I. able to mow his front and back yard without stopping.  5.   Hypertension:  Controlled. Continue current regimen.  6.  HLP:  Continue statin. Labs followed by his primary care physician.  7.  CKD (chronic kidney disease), stage III:  As above.    Follow-up in 6 months.  Ena Dawley  10/31/2016 9:27 AM

## 2016-11-07 ENCOUNTER — Ambulatory Visit: Payer: Medicare Other | Admitting: Cardiology

## 2016-11-28 ENCOUNTER — Ambulatory Visit (INDEPENDENT_AMBULATORY_CARE_PROVIDER_SITE_OTHER): Payer: Medicare Other | Admitting: Neurology

## 2016-11-28 ENCOUNTER — Encounter: Payer: Self-pay | Admitting: Neurology

## 2016-11-28 VITALS — BP 142/68 | HR 60 | Ht 71.0 in | Wt 202.0 lb

## 2016-11-28 DIAGNOSIS — G500 Trigeminal neuralgia: Secondary | ICD-10-CM | POA: Diagnosis not present

## 2016-11-28 MED ORDER — GABAPENTIN 300 MG PO CAPS: 300.0000 mg | ORAL_CAPSULE | Freq: Two times a day (BID) | ORAL | 3 refills | Status: DC

## 2016-11-28 NOTE — Patient Instructions (Signed)
Continue gabapentin 300mg  twice daily  Come back and see me if your pain gets worse.

## 2016-11-28 NOTE — Progress Notes (Signed)
Follow-up Visit   Date: 11/28/16    Erik [name] MRN: [id number] DOB: 1940/11/15   Interim History: Erik [name] is a 76 y.o. right-handed Caucasian male with atrial fibrillation, hyperlipidemia, hypertension, GERD, CAD, CHF (EF 30%), diabetes mellitus, and BPH returning to the clinic for follow-up of trigeminal neuralgia.  The patient was accompanied to the clinic by self.  History of present illness: Since 2000, he had had three spells of trigeminal neuralgia, the most recent episode started 6 months ago. He has extreme sensitivity of the right temple, jaw, cheek, lips, and nose on the right side.  It feels as if he is touching an electric fence.  Symptoms are intermittent and occur daily, lasting about 5-15 seconds.  If he tries to be still, he feels that it alleviates pain quicker.  His pain is exacerbated by brushing his teeth, combing hair, shampooing on the right scalp, and chewing. He was started on carbamazepine 100mg  twice daily and does not appreciate any improvement.  In the past, he used Tegretol and had symptoms resolve within 6 weeks.    UPDATE 11/28/2016:  He is here for 3 month follow-up appointment.  He did not have any benefit with Tegretol 200mg  BID so he was started on gabapentin 300mg  BID and endorses marked improvement.  He now gets very transient facial pain, lasting only a few seconds and occurring once every few weeks.  He has no new complaints.    Medications:  Current Outpatient Prescriptions on File Prior to Visit  Medication Sig Dispense Refill  . acetaminophen (TYLENOL) 325 MG tablet Take 2 tablets (650 mg total) by mouth every 6 (six) hours as needed for mild pain (or Fever >/= 101).    . Artificial Tear Ointment (DRY EYES OP) Place 1 drop into both eyes daily as needed (for dry eyes).    [name] atorvastatin (LIPITOR) 40 MG tablet Take 1 tablet (40 mg total) by mouth daily at 6 PM. 90 tablet 3  . citalopram (CELEXA) 40 MG tablet Take 40 mg by mouth daily.  6   . clonazePAM (KLONOPIN) 0.5 MG tablet Take 0.5 mg by mouth daily as needed.   0  . Coenzyme Q10 (COQ10) 200 MG CAPS Take 100 mg by mouth daily.     [name] diltiazem (CARTIA XT) 120 MG 24 hr capsule TAKE 1 CAPSULE(120 MG) BY MOUTH DAILY 90 capsule 3  . fluticasone (FLONASE) 50 MCG/ACT nasal spray Place 2 sprays into both nostrils daily.  2  . furosemide (LASIX) 40 MG tablet Take 1 tablet (40 mg total) by mouth daily as needed for edema. 90 tablet 3  . gabapentin (NEURONTIN) 300 MG capsule Take 300 mg by mouth 2 (two) times daily.    . metoprolol (LOPRESSOR) 50 MG tablet TAKE 1 AND 1/2 TABLETS(75 MG) BY MOUTH TWICE DAILY 90 tablet 6  . MYRBETRIQ 25 MG TB24 tablet TK 1 T PO QD  11  . omeprazole (PRILOSEC) 20 MG capsule Take 20 mg by mouth daily.    [name] oxyCODONE (OXY IR/ROXICODONE) 5 MG immediate release tablet Take 1 tablet (5 mg total) by mouth every 6 (six) hours as needed for severe pain. 30 tablet 0  . Rivaroxaban (XARELTO) 15 MG TABS tablet Take 1 tablet (15 mg total) by mouth daily with supper. 30 tablet 6  . tamsulosin (FLOMAX) 0.4 MG CAPS capsule Take 0.4 mg by mouth at bedtime.    [name] SOLOSTAR 300 UNIT/ML SOPN Inject 22 Units as directed  daily.  4   No current facility-administered medications on file prior to visit.     Allergies:  Allergies  Allergen Reactions  . Claritin [Loratadine] Other (See Comments)    Urinary retention  . Tradjenta [Linagliptin] Other (See Comments)    High blood sugar    Review of Systems:  CONSTITUTIONAL: No fevers, chills, night sweats, or weight loss.  EYES: No visual changes or eye pain ENT: No hearing changes.  No history of nose bleeds.   RESPIRATORY: No cough, wheezing and shortness of breath.   CARDIOVASCULAR: Negative for chest pain, and palpitations.   GI: Negative for abdominal discomfort, blood in stools or black stools.  No recent change in bowel habits.   GU:  No history of incontinence.   MUSCLOSKELETAL: No history of joint pain or  swelling.  No myalgias.   SKIN: Negative for lesions, rash, and itching.   ENDOCRINE: Negative for cold or heat intolerance, polydipsia or goiter.   PSYCH:  No depression or anxiety symptoms.   NEURO: As Above.   Vital Signs:  BP (!) 142/68   Pulse 60   Ht 5\' 11"  (1.803 m)   Wt 202 lb (91.6 kg)   SpO2 96%   BMI 28.17 kg/m   Neurological Exam: MENTAL STATUS including orientation to time, place, person, recent and remote memory, attention span and concentration, language, and fund of knowledge is normal.  Speech is not dysarthric.  CRANIAL NERVES:  Pupils equal round and reactive to light.  Normal conjugate, extra-ocular eye movements in all directions of gaze.  No ptosis. Normal facial sensation.  Face is symmetric. Palate elevates symmetrically.  Tongue is midline.  MOTOR:  Motor strength is 5/5 in all extremities.    COORDINATION/GAIT:  Normal finger-to- nose-finger.  Gait narrow based and stable.   Data: n/a  IMPRESSION/PLAN: Erik [name] is a 76 year-old male returning for follow-up of right trigeminal neuralgia. He has not had prior imaging because his symptoms responded well to Tegretol in the past. Unfortunately, he did not have any benefit with increasing the dose of Tegretol, so was started on gabapentin.  His pain is very well-controlled on gabapentin 300mg  BID which will be continued.  We discussed tapering the dose going forward, but given the severity of his pain in the past, he prefers to stay on this for now.  Return to clinic as needed.  The duration of this appointment visit was 20 minutes of face-to-face time with the patient.  Greater than 50% of this time was spent in counseling, explanation of diagnosis, planning of further management, and coordination of care.   Thank you for allowing me to participate in patient's care.  If I can answer any additional questions, I would be pleased to do so.    Sincerely,    Lanice Folden K. Posey Pronto, DO

## 2016-11-29 ENCOUNTER — Ambulatory Visit: Payer: Medicare Other | Admitting: Neurology

## 2017-01-03 ENCOUNTER — Other Ambulatory Visit: Payer: Self-pay | Admitting: Cardiology

## 2017-01-03 DIAGNOSIS — I480 Paroxysmal atrial fibrillation: Secondary | ICD-10-CM

## 2017-01-03 DIAGNOSIS — I10 Essential (primary) hypertension: Secondary | ICD-10-CM

## 2017-01-03 DIAGNOSIS — I5020 Unspecified systolic (congestive) heart failure: Secondary | ICD-10-CM

## 2017-01-03 NOTE — Telephone Encounter (Signed)
Age 76 11/28/2016 Wt 91.6kg Saw Dr [name] on 10/31/2016 Lab work done on 01/03/2017 at Dr Osborne Casco office  Sr Cr 1.6 Hgb 14.3 HCT 42.4 done on 01/03/2017 CrCl 49.3 Refill done for Xarelto 15mg  daily as requested

## 2017-01-08 ENCOUNTER — Telehealth: Payer: Self-pay | Admitting: Cardiology

## 2017-01-08 NOTE — Telephone Encounter (Signed)
Will forward Xarelto clearance for upcoming procedure, to our Pharmacist for further review and recommendation.

## 2017-01-08 NOTE — Telephone Encounter (Signed)
° °   [facility] Pre-operative Risk Assessment    Request for surgical clearance:  1. What type of surgery is being performed? Prostate Ultra sound and Prostate Biopsy  When is this surgery scheduled? 02/25/17  2. Are there any medications that need to be held prior to surgery and how long?Xarelto to held prior   3. Name of physician performing surgery? Dr.Mckenzie    4. What is your office phone and fax number?office ph# [phone], fax#[phone]  Erik [name] 01/08/2017, 2:46 PM  _________________________________________________________________   (provider comments below)

## 2017-01-08 NOTE — Telephone Encounter (Signed)
Patient with diagnosis of Afib on Xarelto for anticoagulation.    Procedure: prostate biopsy Date of procedure: 02/25/17  CHADS2 score of 4 (CHF, HTN, AGE, DM2);  CHADS2-VASc score of  6 (CHF, HTN, AGE, DM2, CAD, AGE)  CrCl 48 mL/min  Per office protocol, patient can hold Xarelto for 24 hours prior to procedure.  Faxed via Epic.

## 2017-01-28 ENCOUNTER — Telehealth: Payer: Self-pay | Admitting: Cardiology

## 2017-01-28 NOTE — Telephone Encounter (Signed)
Erik [name], [facility]      01/08/17 5:11 PM  Note    Patient with diagnosis of Afib on Xarelto for anticoagulation.    Procedure: prostate biopsy Date of procedure: 02/25/17  CHADS2 score of 4 (CHF, HTN, AGE, DM2);  CHADS2-VASc score of  6 (CHF, HTN, AGE, DM2, CAD, AGE)  CrCl 48 mL/min  Per office protocol, patient can hold Xarelto for 24 hours prior to procedure.  Faxed via Epic.

## 2017-01-28 NOTE — Telephone Encounter (Signed)
Follow Up:    Otila Kluver calling to follow up on clearance that was sent over on 01-08-17.Please fax asap to [phone] Att: Ladell Heads.

## 2017-01-28 NOTE — Telephone Encounter (Signed)
Spoke with Otila Kluver at [facility], and informed her that this clearance was originally faxed on 8/15 by our Pharmacist Eino Farber.  Informed Otila Kluver that we will refax this clearance back to her office at [phone].  Cleora Fleet to call the office back if she still has any issues receiving this fax.  Otila Kluver verbalized understanding and agrees with this plan.

## 2017-03-19 ENCOUNTER — Telehealth: Payer: Self-pay | Admitting: Cardiology

## 2017-03-19 NOTE — Telephone Encounter (Signed)
Patient aware that I will place two weeks of samples at the front desk. He verbalized his understanding and appreciation.

## 2017-03-19 NOTE — Telephone Encounter (Signed)
New message    Patient calling the office for samples of medication:   1.  What medication and dosage are you requesting samples for?  15mg  samples   2.  Are you currently out of this medication? 1 day left    In donut hole can not afford medication

## 2017-04-05 ENCOUNTER — Other Ambulatory Visit: Payer: Self-pay | Admitting: Cardiology

## 2017-04-05 DIAGNOSIS — I480 Paroxysmal atrial fibrillation: Secondary | ICD-10-CM

## 2017-04-05 DIAGNOSIS — I5020 Unspecified systolic (congestive) heart failure: Secondary | ICD-10-CM

## 2017-04-05 DIAGNOSIS — I10 Essential (primary) hypertension: Secondary | ICD-10-CM

## 2017-04-07 ENCOUNTER — Other Ambulatory Visit: Payer: Self-pay

## 2017-04-07 ENCOUNTER — Telehealth: Payer: Self-pay | Admitting: Cardiology

## 2017-04-07 DIAGNOSIS — I480 Paroxysmal atrial fibrillation: Secondary | ICD-10-CM

## 2017-04-07 DIAGNOSIS — I10 Essential (primary) hypertension: Secondary | ICD-10-CM

## 2017-04-07 DIAGNOSIS — I5020 Unspecified systolic (congestive) heart failure: Secondary | ICD-10-CM

## 2017-04-07 MED ORDER — RIVAROXABAN 15 MG PO TABS: ORAL_TABLET | ORAL | 3 refills | Status: DC

## 2017-04-07 NOTE — Telephone Encounter (Addendum)
The pt is advised that I am leaving him 2 bottles of Xarelto samples in the front office along with a [name] and [employer] assistance application for him to pick up. He is aware to complete his application, obtain proof of his income and an out of pocket expense report from his pharmacy and return all to me so I can fax it to [employer] and [place].  I have completed the provider part of the application, printed a Xarelto RX and placed them both in Dr Thera Flake mail bin awaiting her signature.

## 2017-04-07 NOTE — Telephone Encounter (Signed)
Patient calling the office for samples of medication:   1.  What medication and dosage are you requesting samples for? xarelto 15mg   2.  Are you currently out of this medication? yes

## 2017-04-09 NOTE — Telephone Encounter (Signed)
Dr [name] has signed the provider part of the application and the RX. I have placed both in he yellow folder in the PA Dept.

## 2017-04-13 ENCOUNTER — Other Ambulatory Visit: Payer: Self-pay | Admitting: Cardiology

## 2017-04-16 ENCOUNTER — Telehealth: Payer: Self-pay | Admitting: Cardiology

## 2017-04-16 NOTE — Telephone Encounter (Signed)
Walk In pt form-Sealed Envelope Dropped off placed in Dr.Nelson Doc Box.

## 2017-04-22 NOTE — Telephone Encounter (Signed)
**Note De-Identified Anuel Sitter Obfuscation** The pt states that he will get the needed documents from his bank and pharmacy and bring them to me so I can fax to [place] pt assistance program.

## 2017-04-22 NOTE — Telephone Encounter (Addendum)
The pt returned his [name] and [place] Pt assistance application without his proof of income or his out of pocket expense report from his pharmacy. These documents are needed prior to faxing his application to [employer]. (I have placed the pts application in the yellow folder in the PA Dept).  I left a message on the pts home voice mail asking him to call me back. The number listed as the pts cell phone gives message "We are unable to complete your call".

## 2017-05-09 ENCOUNTER — Telehealth: Payer: Self-pay

## 2017-05-09 NOTE — Telephone Encounter (Signed)
Application for Xarelto faxed to [employer] Assistance program today.

## 2017-05-21 DIAGNOSIS — E1169 Type 2 diabetes mellitus with other specified complication: Secondary | ICD-10-CM | POA: Insufficient documentation

## 2017-05-21 DIAGNOSIS — E785 Hyperlipidemia, unspecified: Secondary | ICD-10-CM | POA: Insufficient documentation

## 2017-06-04 ENCOUNTER — Other Ambulatory Visit: Payer: Self-pay | Admitting: Cardiology

## 2017-06-04 ENCOUNTER — Ambulatory Visit: Payer: Medicare HMO | Admitting: Cardiology

## 2017-06-04 ENCOUNTER — Encounter: Payer: Self-pay | Admitting: Cardiology

## 2017-06-04 VITALS — BP 148/52 | HR 60 | Ht 71.0 in | Wt 211.1 lb

## 2017-06-04 DIAGNOSIS — I5022 Chronic systolic (congestive) heart failure: Secondary | ICD-10-CM | POA: Diagnosis not present

## 2017-06-04 DIAGNOSIS — I5020 Unspecified systolic (congestive) heart failure: Secondary | ICD-10-CM | POA: Diagnosis not present

## 2017-06-04 DIAGNOSIS — I480 Paroxysmal atrial fibrillation: Secondary | ICD-10-CM | POA: Diagnosis not present

## 2017-06-04 DIAGNOSIS — I10 Essential (primary) hypertension: Secondary | ICD-10-CM | POA: Diagnosis not present

## 2017-06-04 DIAGNOSIS — J019 Acute sinusitis, unspecified: Secondary | ICD-10-CM | POA: Diagnosis not present

## 2017-06-04 MED ORDER — AMOXICILLIN-POT CLAVULANATE 875-125 MG PO TABS: 1.0000 | ORAL_TABLET | Freq: Two times a day (BID) | ORAL | 0 refills | Status: DC

## 2017-06-04 NOTE — Progress Notes (Signed)
Patient ID: Erik [name], male   DOB: [birth year], 77 y.o.   MRN: [id number]    Date:  06/04/2017   ID:  Erik [name], Erik [name] 05-Mar-1941, MRN [id number]  PCP:  [name], MD  Cardiologist:  Dr. Jenell Milliner => Dr. Ena Dawley     Chief complain: Follow-up 6 months  History of Present Illness: Erik [name] is a 77 y.o. male with a hx of no significant CAD on cath in 2004, PVCs, NICM (EF 35% in past and improved to 55% in 2010), CKD, DM, HTN, HL.  He was admitted 08/2013 with chest discomfort in the setting of AFib with RVR. He converted to NSR on IV dilt. Cardiac enzymes remained normal.  He was placed on Xarelto for anticoagulation (CHADS2-VASc=3).  Echocardiogram demonstrated worsening LV function with an EF of 35-40% and inferior and inferolateral hypokinesis. Patient was set up for a nuclear study as an inpatient. This demonstrated mid to apical anteroseptal perfusion defect suggestive of possible infarct but no ischemia, EF 48%. This was felt to be a low risk study. Decision was made to treat him medically.  Of note, on enalapril was stopped in the setting of CKD.  Carbamazepine was stopped due to interaction with Xarelto.  04/15/2014 - this is 5 months follow-up. At the last visit in June we found out that patient's ejection fraction dropped to 35-40%. He's ACE inhibitor was discontinued because of CK D stage III. We restarted lisinopril and his follow-up echo showed improvement of his ejection fraction to 50-55%. Patient states today that his functional capacity is good, he denies any angina or shortness of breath. He denies any palpitations. He's very compliant he with his medications. He has several correction regarding taking over-the-counter supplements including selenium, L-arginine, and magnesium.  03/29/2016 - the patient is coming after 6 months, he has been feeling very well, he didn't need to use Lasix at all. His weight remained stable, he denies any orthopnea or paroxysmal  nocturnal dyspnea, he has no lower extremity edema no palpitation no dizziness or syncope. He denies any chest pain. He's been compliant with his meds and denies any bleeding no black tarry stools, no muscle pain from statins, he has prostate issues and he is going to see an urologist this months.  10/31/2016 - 6 months follow-up, the patient states that he symptoms haven't changed, he denies any chest pain shortness of breath no palpitation or dizziness presyncope or syncope. He experiences no claudications. He denies any lower extremity edema orthopnea or paroxysmal nocturnal dyspnea. His only complain is high co-pay with his medications for example is Xarelto recently went from $40-$180 per months.  06/04/2017 - 6 months follow up, the patient is doing well, denies bleeding with Xarelto, he would like to know is he needs bridging with a potential prostate surgery. Denies chest pain, no LE edema orthopnea or PND. He has been having cough - productive of yellowish - green sputum for the last 4 weeks. Sinus pain, no fever or chills.  Studies:  - LHC (12/2002):  No significant CAD, EF 30-35% (done after CLite demonstrated poss inf ischemia).  - Echo (09/22/13):  EF 35-40%, inferolateral and inf HK, Gr 2 DD, mild LAE.   - Nuclear (09/23/13):  Poss anteroseptal infarct, no ischemia, EF 48%; Low Risk  Recent Labs: No results found for requested labs within last 8760 hours.09/29/2013:  K 4.7, creatinine 1.9, Hgb 16.1  Wt Readings from Last 3 Encounters:  06/04/17 211 lb 1.9  oz (95.8 kg)  11/28/16 202 lb (91.6 kg)  10/31/16 211 lb (95.7 kg)     Past Medical History:  Diagnosis Date  . Atrial fibrillation ([place])    a. 09/2013: RVR then bradycardia then spont conv to NSR.  . CKD (chronic kidney disease), stage III ([place])   . Coronary artery disease    a. Nonobstructive by cath 2004. b. Nuc 09/2013 - no ischemia, fixed defect suggestive of possible prior infarction, felt low risk, EF 48%.   . Diabetes  mellitus   . GERD (gastroesophageal reflux disease)   . Hyperlipidemia   . Hypertension   . NICM (nonischemic cardiomyopathy) ([place])    a. EF 35-40% in 2009, 55% in 2010. b. 09/2013: 35-40% by echo and 48% by nuc.  . 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 Premature ventricular contractions    Current Outpatient Medications  Medication Sig Dispense Refill  . acetaminophen (TYLENOL) 325 MG tablet Take 2 tablets (650 mg total) by mouth every 6 (six) hours as needed for mild pain (or Fever >/= 101).    . Artificial Tear Ointment (DRY EYES OP) Place 1 drop into both eyes daily as needed (for dry eyes).    [name] atorvastatin (LIPITOR) 40 MG tablet Take 1 tablet (40 mg total) by mouth daily at 6 PM. 90 tablet 3  . citalopram (CELEXA) 40 MG tablet Take 40 mg by mouth daily.  6  . clonazePAM (KLONOPIN) 0.5 MG tablet Take 0.5 mg by mouth daily as needed.   0  . Coenzyme Q10 (COQ10) 200 MG CAPS Take 100 mg by mouth daily.     [name] diltiazem (CARTIA XT) 120 MG 24 hr capsule TAKE 1 CAPSULE(120 MG) BY MOUTH DAILY 90 capsule 3  . finasteride (PROSCAR) 5 MG tablet Take 5 mg by mouth once daily  3  . fluticasone (FLONASE) 50 MCG/ACT nasal spray Place 2 sprays into both nostrils daily.  2  . furosemide (LASIX) 40 MG tablet TAKE 1 TABLET(40 MG) BY MOUTH DAILY AS NEEDED FOR SWELLING 90 tablet 1  . gabapentin (NEURONTIN) 300 MG capsule Take 1 capsule (300 mg total) by mouth 2 (two) times daily. 180 capsule 3  . metoprolol tartrate (LOPRESSOR) 50 MG tablet TAKE 1 AND 1/2 TABLETS(75 MG) BY MOUTH TWICE DAILY 90 tablet 5  . MYRBETRIQ 25 MG TB24 tablet TK 1 T PO QD  11  . omeprazole (PRILOSEC) 20 MG capsule Take 20 mg by mouth daily.    [name] oxyCODONE (OXY IR/ROXICODONE) 5 MG immediate release tablet Take 1 tablet (5 mg total) by mouth every 6 (six) hours as needed for severe pain. 30 tablet 0  . Rivaroxaban (XARELTO) 15 MG TABS tablet TAKE 1 TABLET(15 MG) BY MOUTH DAILY WITH SUPPER 90 tablet 3  . tamsulosin (FLOMAX) 0.4  MG CAPS capsule Take 0.4 mg by mouth at bedtime.    [name] SOLOSTAR 300 UNIT/ML SOPN Inject 22 Units as directed daily.  4   No current facility-administered medications for this visit.    Allergies:   Claritin [loratadine] and Tradjenta [linagliptin]   Social History:  The patient  reports that he quit smoking about 32 years ago. His smoking use included cigarettes. He has a 5.00 pack-year smoking history. he has never used smokeless tobacco. He reports that he does not drink alcohol or use drugs.   Family History:  The patient's family history includes Diabetes in his brother and mother; Heart disease in his brother, father, and  mother; Hypertension in his brother and mother.   ROS:  Please see the history of present illness.   He denies bleeding problems.   All other systems reviewed and negative.   PHYSICAL EXAM: VS:  BP (!) 148/52   Pulse 60   Ht 5\' 11"  (1.803 m)   Wt 211 lb 1.9 oz (95.8 kg)   SpO2 98%   BMI 29.45 kg/m  Well nourished, well developed, in no acute distress  HEENT: normal  Neck: no JVD  Cardiac:  normal S1, S2; RRR; no murmur  Lungs:  Mild crackles at the right base, no wheezing, rhonchi or rales  Abd: soft, nontender, no hepatomegaly  Ext: Trivial edema bilaterally.  Skin: warm and dry  Neuro:  CNs 2-12 intact, no focal abnormalities noted  EKG:  NSR, HR 74, normal axis, PVCs, nonspecific ST-T wave changes     ECHO: 12/10/2013  - Left ventricle: The cavity size was moderately dilated. Wall thickness was normal. Systolic function was normal. The estimated ejection fraction was in the range of 50% to 55%. - Left atrium: The atrium was mildly dilated.  TTE: 01/23/15 - Left ventricle: The cavity size was normal. Systolic function was normal. The estimated ejection fraction was in the range of 50% to 55%. There is hypokinesis of the basal-midinferior myocardium. Doppler parameters are consistent with abnormal left ventricular relaxation (grade  1 diastolic dysfunction). - Ventricular septum: Septal motion showed paradox. - Mitral valve: Mildly calcified annulus. There was mild regurgitation. - Left atrium: The atrium was mildly dilated.  Impressions:  - Compared to the prior study, there has been no significant interval change.  EKG performed today 06/04/17 showed wandering pacemaker with 3 different morphology P waves and ventricular rate 59 bpm unchanged from prior.    ASSESSMENT AND PLAN:  1. Acute sinusitis - start augmentin 875/125 mg PO BID x 5 days.  2. Chronic systolic CHF - he is euvolemic, continue Lasix 40 mg PRN  3. Atrial fibrillation:  Not in a-fib, ECG shows wandering PM again. Xarelto tolerated well, if he needs a prostate surgery no bridging is needed.   3. Coronary artery disease:  Presumed given WMA on echo and scar on Nuclear study, however he is asymptomatic.  Continue med Rx.  He is not on ASA as he is on Xarelto.  Continue beta blocker, statin. No side effects from statin.  4.   NICM (nonischemic cardiomyopathy):  His EF has improved to 50-55% with lisinopril, discontinued for worsening CK D stage III, last in 10/16 1.6. He is well compensated and asymptomatic. NYHA I.  5.   Hypertension:  Repeat BP 139/75 mmHg. Continue current regimen.  6.  HLP:  Continue statin. Labs followed by his primary care physician.  7.  CKD (chronic kidney disease), stage III:  As above.    Follow-up in 4 weeks.  Ena Dawley  06/04/2017 1:48 PM

## 2017-06-04 NOTE — Patient Instructions (Addendum)
Medication Instructions:   TAKE AUGMENTIN 875/125 MG-TAKE 1 TAB EVERY 12 HOURS FOR 5 DAYS ONLY     Labwork:  HAVE DONE A WEEK PRIOR TO YOUR 4 MONTH FOLLOW-UP APPOINTMENT WITH DR NELSON TO CHECK---CMET, TSH, AND CBC W DIFF    Follow-Up:  4 MONTHS WITH DR NELSON--PLEASE HAVE YOUR LABS DONE A WEEK PRIOR TO THIS APPOINTMENT        If you need a refill on your cardiac medications before your next appointment, please call your pharmacy.

## 2017-06-06 ENCOUNTER — Ambulatory Visit: Payer: Medicare Other | Admitting: Cardiology

## 2017-07-07 ENCOUNTER — Telehealth: Payer: Self-pay | Admitting: Cardiology

## 2017-07-07 NOTE — Telephone Encounter (Signed)
Pt calling in to ask for medicine, for he feels like he has the flu.  Pt states that he has fever, coughing, wheezing, and body aches. Pt would like for Dr [name] to prescribe him a med for this.  Informed the pt that Dr [name] is out of the office and he will need to follow-up with his PCP or urgent care, for appropriate testing of his symptoms, and to be treated accordingly.  Informed the pt that he will have to be assessed and tested for the flu, before a Provider will call a med in.  Advised the pt to refer to his PCP, and if he's unable to obtain an appt with them, then he should refer to an urgent care in the area. Pt verbalized understanding and agrees with this plan.

## 2017-07-07 NOTE — Telephone Encounter (Signed)
New message   Patient calling stating he has the flu advise patient PCP .

## 2017-07-08 DIAGNOSIS — E1159 Type 2 diabetes mellitus with other circulatory complications: Secondary | ICD-10-CM | POA: Diagnosis not present

## 2017-07-08 DIAGNOSIS — I480 Paroxysmal atrial fibrillation: Secondary | ICD-10-CM | POA: Diagnosis not present

## 2017-07-08 DIAGNOSIS — E1129 Type 2 diabetes mellitus with other diabetic kidney complication: Secondary | ICD-10-CM | POA: Diagnosis not present

## 2017-07-08 DIAGNOSIS — J111 Influenza due to unidentified influenza virus with other respiratory manifestations: Secondary | ICD-10-CM | POA: Diagnosis not present

## 2017-07-08 DIAGNOSIS — I509 Heart failure, unspecified: Secondary | ICD-10-CM | POA: Diagnosis not present

## 2017-07-08 DIAGNOSIS — I1310 Hypertensive heart and chronic kidney disease without heart failure, with stage 1 through stage 4 chronic kidney disease, or unspecified chronic kidney disease: Secondary | ICD-10-CM | POA: Diagnosis not present

## 2017-07-08 DIAGNOSIS — R05 Cough: Secondary | ICD-10-CM | POA: Diagnosis not present

## 2017-07-08 DIAGNOSIS — Z6829 Body mass index (BMI) 29.0-29.9, adult: Secondary | ICD-10-CM | POA: Diagnosis not present

## 2017-07-21 DIAGNOSIS — Z125 Encounter for screening for malignant neoplasm of prostate: Secondary | ICD-10-CM | POA: Diagnosis not present

## 2017-07-21 DIAGNOSIS — I480 Paroxysmal atrial fibrillation: Secondary | ICD-10-CM | POA: Diagnosis not present

## 2017-07-21 DIAGNOSIS — N183 Chronic kidney disease, stage 3 (moderate): Secondary | ICD-10-CM | POA: Diagnosis not present

## 2017-07-21 DIAGNOSIS — E785 Hyperlipidemia, unspecified: Secondary | ICD-10-CM | POA: Diagnosis not present

## 2017-07-21 DIAGNOSIS — E1159 Type 2 diabetes mellitus with other circulatory complications: Secondary | ICD-10-CM | POA: Diagnosis not present

## 2017-07-21 DIAGNOSIS — E7800 Pure hypercholesterolemia, unspecified: Secondary | ICD-10-CM | POA: Diagnosis not present

## 2017-07-29 DIAGNOSIS — N183 Chronic kidney disease, stage 3 (moderate): Secondary | ICD-10-CM | POA: Diagnosis not present

## 2017-07-29 DIAGNOSIS — E1129 Type 2 diabetes mellitus with other diabetic kidney complication: Secondary | ICD-10-CM | POA: Diagnosis not present

## 2017-07-29 DIAGNOSIS — N401 Enlarged prostate with lower urinary tract symptoms: Secondary | ICD-10-CM | POA: Diagnosis not present

## 2017-07-29 DIAGNOSIS — I10 Essential (primary) hypertension: Secondary | ICD-10-CM | POA: Diagnosis not present

## 2017-07-29 DIAGNOSIS — I1310 Hypertensive heart and chronic kidney disease without heart failure, with stage 1 through stage 4 chronic kidney disease, or unspecified chronic kidney disease: Secondary | ICD-10-CM | POA: Diagnosis not present

## 2017-07-29 DIAGNOSIS — E7800 Pure hypercholesterolemia, unspecified: Secondary | ICD-10-CM | POA: Diagnosis not present

## 2017-07-29 DIAGNOSIS — Z0000 Encounter for general adult medical examination without abnormal findings: Secondary | ICD-10-CM | POA: Diagnosis not present

## 2017-07-29 DIAGNOSIS — F411 Generalized anxiety disorder: Secondary | ICD-10-CM | POA: Diagnosis not present

## 2017-07-29 DIAGNOSIS — I509 Heart failure, unspecified: Secondary | ICD-10-CM | POA: Diagnosis not present

## 2017-07-30 DIAGNOSIS — Z1212 Encounter for screening for malignant neoplasm of rectum: Secondary | ICD-10-CM | POA: Diagnosis not present

## 2017-08-14 DIAGNOSIS — H11421 Conjunctival edema, right eye: Secondary | ICD-10-CM | POA: Diagnosis not present

## 2017-08-19 ENCOUNTER — Other Ambulatory Visit: Payer: Self-pay | Admitting: Cardiology

## 2017-08-28 DIAGNOSIS — H11421 Conjunctival edema, right eye: Secondary | ICD-10-CM | POA: Diagnosis not present

## 2017-09-02 ENCOUNTER — Other Ambulatory Visit: Payer: Self-pay | Admitting: Cardiology

## 2017-09-02 DIAGNOSIS — I10 Essential (primary) hypertension: Secondary | ICD-10-CM

## 2017-09-02 DIAGNOSIS — I5020 Unspecified systolic (congestive) heart failure: Secondary | ICD-10-CM

## 2017-09-02 DIAGNOSIS — I480 Paroxysmal atrial fibrillation: Secondary | ICD-10-CM

## 2017-09-02 NOTE — Telephone Encounter (Signed)
Age 77 years Wt 95.8kg  01/09/219 Saw Dr [name] 06/04/2017 07/29/2017 SrCr 2.0  Hgb 14.5 HCT 45.3 CrCl 41.24 Refill done for Xarelto 15mg  daily as requested

## 2017-09-18 DIAGNOSIS — R351 Nocturia: Secondary | ICD-10-CM | POA: Diagnosis not present

## 2017-09-18 DIAGNOSIS — R972 Elevated prostate specific antigen [PSA]: Secondary | ICD-10-CM | POA: Diagnosis not present

## 2017-09-18 DIAGNOSIS — N401 Enlarged prostate with lower urinary tract symptoms: Secondary | ICD-10-CM | POA: Diagnosis not present

## 2017-09-22 ENCOUNTER — Other Ambulatory Visit: Payer: Medicare HMO | Admitting: *Deleted

## 2017-09-22 DIAGNOSIS — I5020 Unspecified systolic (congestive) heart failure: Secondary | ICD-10-CM

## 2017-09-22 DIAGNOSIS — J019 Acute sinusitis, unspecified: Secondary | ICD-10-CM | POA: Diagnosis not present

## 2017-09-22 DIAGNOSIS — I5022 Chronic systolic (congestive) heart failure: Secondary | ICD-10-CM

## 2017-09-22 DIAGNOSIS — I480 Paroxysmal atrial fibrillation: Secondary | ICD-10-CM | POA: Diagnosis not present

## 2017-09-22 DIAGNOSIS — I10 Essential (primary) hypertension: Secondary | ICD-10-CM

## 2017-09-22 LAB — COMPREHENSIVE METABOLIC PANEL
ALT: 13 IU/L (ref 0–44)
AST: 18 IU/L (ref 0–40)
Albumin/Globulin Ratio: 1.9 (ref 1.2–2.2)
Albumin: 3.7 g/dL (ref 3.5–4.8)
Alkaline Phosphatase: 67 IU/L (ref 39–117)
BUN/Creatinine Ratio: 8 — ABNORMAL LOW (ref 10–24)
BUN: 13 mg/dL (ref 8–27)
Bilirubin Total: 0.8 mg/dL (ref 0.0–1.2)
CO2: 26 mmol/L (ref 20–29)
Calcium: 8.8 mg/dL (ref 8.6–10.2)
Chloride: 104 mmol/L (ref 96–106)
Creatinine, Ser: 1.6 mg/dL — ABNORMAL HIGH (ref 0.76–1.27)
GFR calc Af Amer: 47 mL/min/{1.73_m2} — ABNORMAL LOW (ref >59)
GFR calc non Af Amer: 41 mL/min/{1.73_m2} — ABNORMAL LOW (ref >59)
Globulin, Total: 1.9 g/dL (ref 1.5–4.5)
Glucose: 118 mg/dL — ABNORMAL HIGH (ref 65–99)
Potassium: 4.7 mmol/L (ref 3.5–5.2)
Sodium: 143 mmol/L (ref 134–144)
Total Protein: 5.6 g/dL — ABNORMAL LOW (ref 6.0–8.5)

## 2017-09-22 LAB — CBC WITH DIFFERENTIAL/PLATELET
Basophils Absolute: 0.1 10*3/uL (ref 0.0–0.2)
Basos: 1 %
EOS (ABSOLUTE): 0.3 10*3/uL (ref 0.0–0.4)
Eos: 5 %
Hematocrit: 38.1 % (ref 37.5–51.0)
Hemoglobin: 13.2 g/dL (ref 13.0–17.7)
Immature Grans (Abs): 0 10*3/uL (ref 0.0–0.1)
Immature Granulocytes: 0 %
Lymphocytes Absolute: 1.5 10*3/uL (ref 0.7–3.1)
Lymphs: 27 %
MCH: 31.2 pg (ref 26.6–33.0)
MCHC: 34.6 g/dL (ref 31.5–35.7)
MCV: 90 fL (ref 79–97)
Monocytes Absolute: 0.5 10*3/uL (ref 0.1–0.9)
Monocytes: 10 %
Neutrophils Absolute: 3.2 10*3/uL (ref 1.4–7.0)
Neutrophils: 57 %
Platelets: 123 10*3/uL — ABNORMAL LOW (ref 150–379)
RBC: 4.23 x10E6/uL (ref 4.14–5.80)
RDW: 14.3 % (ref 12.3–15.4)
WBC: 5.5 10*3/uL (ref 3.4–10.8)

## 2017-09-22 LAB — TSH: TSH: 2.47 u[IU]/mL (ref 0.450–4.500)

## 2017-09-26 DIAGNOSIS — H11421 Conjunctival edema, right eye: Secondary | ICD-10-CM | POA: Diagnosis not present

## 2017-09-26 DIAGNOSIS — H25813 Combined forms of age-related cataract, bilateral: Secondary | ICD-10-CM | POA: Diagnosis not present

## 2017-09-26 DIAGNOSIS — E119 Type 2 diabetes mellitus without complications: Secondary | ICD-10-CM | POA: Diagnosis not present

## 2017-09-26 DIAGNOSIS — H33302 Unspecified retinal break, left eye: Secondary | ICD-10-CM | POA: Diagnosis not present

## 2017-09-29 ENCOUNTER — Encounter: Payer: Self-pay | Admitting: Cardiology

## 2017-09-29 ENCOUNTER — Ambulatory Visit: Payer: Medicare HMO | Admitting: Cardiology

## 2017-09-29 VITALS — BP 138/66 | HR 56 | Ht 70.0 in | Wt 206.6 lb

## 2017-09-29 DIAGNOSIS — I5022 Chronic systolic (congestive) heart failure: Secondary | ICD-10-CM | POA: Diagnosis not present

## 2017-09-29 DIAGNOSIS — I2583 Coronary atherosclerosis due to lipid rich plaque: Secondary | ICD-10-CM

## 2017-09-29 DIAGNOSIS — I428 Other cardiomyopathies: Secondary | ICD-10-CM | POA: Diagnosis not present

## 2017-09-29 DIAGNOSIS — I10 Essential (primary) hypertension: Secondary | ICD-10-CM | POA: Diagnosis not present

## 2017-09-29 DIAGNOSIS — E782 Mixed hyperlipidemia: Secondary | ICD-10-CM | POA: Diagnosis not present

## 2017-09-29 DIAGNOSIS — I2510 Atherosclerotic heart disease of native coronary artery without angina pectoris: Secondary | ICD-10-CM | POA: Diagnosis not present

## 2017-09-29 NOTE — Patient Instructions (Signed)

## 2017-09-29 NOTE — Progress Notes (Signed)
Patient ID: [name], male   DOB: August 23, 1940, 77 y.o.   MRN: [id number]    Date:  09/29/2017   ID:  Erik [name], Erik [name] 06/17/1940, MRN [id number]  PCP:  Erik Pao, MD  Cardiologist:  Dr. Jenell Milliner => Dr. Ena Dawley     Chief complain: Follow-up 6 months  History of Present Illness: Erik [name] is a 77 y.o. male with a hx of no significant CAD on cath in 2004, PVCs, NICM (EF 35% in past and improved to 55% in 2010), CKD, DM, HTN, HL.  He was admitted 08/2013 with chest discomfort in the setting of AFib with RVR. He converted to NSR on IV dilt. Cardiac enzymes remained normal.  He was placed on Xarelto for anticoagulation (CHADS2-VASc=3).  Echocardiogram demonstrated worsening LV function with an EF of 35-40% and inferior and inferolateral hypokinesis. Patient was set up for a nuclear study as an inpatient. This demonstrated mid to apical anteroseptal perfusion defect suggestive of possible infarct but no ischemia, EF 48%. This was felt to be a low risk study. Decision was made to treat him medically.  Of note, on enalapril was stopped in the setting of CKD.  Carbamazepine was stopped due to interaction with Xarelto.  04/15/2014 - this is 5 months follow-up. At the last visit in June we found out that patient's ejection fraction dropped to 35-40%. He's ACE inhibitor was discontinued because of CK D stage III. We restarted lisinopril and his follow-up echo showed improvement of his ejection fraction to 50-55%. Patient states today that his functional capacity is good, he denies any angina or shortness of breath. He denies any palpitations. He's very compliant he with his medications. He has several correction regarding taking over-the-counter supplements including selenium, L-arginine, and magnesium.  03/29/2016 - the patient is coming after 6 months, he has been feeling very well, he didn't need to use Lasix at all. His weight remained stable, he denies any orthopnea or paroxysmal  nocturnal dyspnea, he has no lower extremity edema no palpitation no dizziness or syncope. He denies any chest pain. He's been compliant with his meds and denies any bleeding no black tarry stools, no muscle pain from statins, he has prostate issues and he is going to see an urologist this months.  10/31/2016 - 6 months follow-up, the patient states that he symptoms haven't changed, he denies any chest pain shortness of breath no palpitation or dizziness presyncope or syncope. He experiences no claudications. He denies any lower extremity edema orthopnea or paroxysmal nocturnal dyspnea. His only complain is high co-pay with his medications for example is Xarelto recently went from $40-$180 per months.  06/04/2017 - 6 months follow up, the patient is doing well, denies bleeding with Xarelto, he would like to know is he needs bridging with a potential prostate surgery. Denies chest pain, no LE edema orthopnea or PND. He has been having cough - productive of yellowish - green sputum for the last 4 weeks. Sinus pain, no fever or chills.  09/29/17 - 4 months follow up, no complains, no LE edema, orthopnea, PND. He is complaint with his meds, no side effects.    Studies:  - LHC (12/2002):  No significant CAD, EF 30-35% (done after CLite demonstrated poss inf ischemia).  - Echo (09/22/13):  EF 35-40%, inferolateral and inf HK, Gr 2 DD, mild LAE.   - Nuclear (09/23/13):  Poss anteroseptal infarct, no ischemia, EF 48%; Low Risk  Recent Labs: 09/22/2017: ALT 13; Creatinine,  Ser 1.60; Hemoglobin 13.2; Potassium 4.7; TSH 2.4705/10/2013:  K 4.7, creatinine 1.9, Hgb 16.1  Wt Readings from Last 3 Encounters:  09/29/17 206 lb 9.6 oz (93.7 kg)  06/04/17 211 lb 1.9 oz (95.8 kg)  11/28/16 202 lb (91.6 kg)     Past Medical History:  Diagnosis Date  . Atrial fibrillation ([place])    a. 09/2013: RVR then bradycardia then spont conv to NSR.  . CKD (chronic kidney disease), stage III ([place])   . Coronary artery disease    a.  Nonobstructive by cath 2004. b. Nuc 09/2013 - no ischemia, fixed defect suggestive of possible prior infarction, felt low risk, EF 48%.   . Diabetes mellitus   . GERD (gastroesophageal reflux disease)   . Hyperlipidemia   . Hypertension   . NICM (nonischemic cardiomyopathy) ([place])    a. EF 35-40% in 2009, 55% in 2010. b. 09/2013: 35-40% by echo and 48% by nuc.  . 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 Premature ventricular contractions    Current Outpatient Medications  Medication Sig Dispense Refill  . acetaminophen (TYLENOL) 325 MG tablet Take 2 tablets (650 mg total) by mouth every 6 (six) hours as needed for mild pain (or Fever >/= 101).    [name] atorvastatin (LIPITOR) 40 MG tablet Take 1 tablet (40 mg total) by mouth daily at 6 PM. 90 tablet 3  . CARTIA XT 120 MG 24 hr capsule TAKE 1 CAPSULE(120 MG) BY MOUTH DAILY 90 capsule 3  . citalopram (CELEXA) 40 MG tablet Take 40 mg by mouth daily.  6  . clonazePAM (KLONOPIN) 0.5 MG tablet Take 0.5 mg by mouth daily as needed.   0  . Coenzyme Q10 (COQ10) 200 MG CAPS Take 100 mg by mouth daily.     . finasteride (PROSCAR) 5 MG tablet Take 5 mg by mouth once daily  3  . fluticasone (FLONASE) 50 MCG/ACT nasal spray Place 2 sprays into both nostrils daily.  2  . furosemide (LASIX) 40 MG tablet TAKE 1 TABLET(40 MG) BY MOUTH DAILY AS NEEDED FOR SWELLING 90 tablet 1  . gabapentin (NEURONTIN) 300 MG capsule Take 1 capsule (300 mg total) by mouth 2 (two) times daily. 180 capsule 3  . metoprolol tartrate (LOPRESSOR) 50 MG tablet TAKE 1 AND 1/2 TABLETS(75 MG) BY MOUTH TWICE DAILY 270 tablet 2  . omeprazole (PRILOSEC) 20 MG capsule Take 20 mg by mouth daily.    [name] oxyCODONE (OXY IR/ROXICODONE) 5 MG immediate release tablet Take 1 tablet (5 mg total) by mouth every 6 (six) hours as needed for severe pain. 30 tablet 0  . Rivaroxaban (XARELTO) 15 MG TABS tablet TAKE 1 TABLET(15 MG) BY MOUTH DAILY WITH SUPPER 90 tablet 3  . tamsulosin (FLOMAX) 0.4 MG CAPS capsule  Take 0.4 mg by mouth at bedtime.    [name] SOLOSTAR 300 UNIT/ML SOPN Inject 36 Units as directed daily.   4   No current facility-administered medications for this visit.    Allergies:   Claritin [loratadine] and Tradjenta [linagliptin]   Social History:  The patient  reports that he quit smoking about 32 years ago. His smoking use included cigarettes. He has a 5.00 pack-year smoking history. He has never used smokeless tobacco. He reports that he does not drink alcohol or use drugs.   Family History:  The patient's family history includes Diabetes in his brother and mother; Heart disease in his brother, father, and mother; Hypertension in his brother and mother.  ROS:  Please see the history of present illness.   He denies bleeding problems.   All other systems reviewed and negative.   PHYSICAL EXAM: VS:  BP 138/66   Pulse (!) 56   Ht 5\' 10"  (1.778 m)   Wt 206 lb 9.6 oz (93.7 kg)   SpO2 96%   BMI 29.64 kg/m  Well nourished, well developed, in no acute distress  HEENT: normal  Neck: no JVD  Cardiac:  normal S1, S2; RRR; no murmur  Lungs:  Mild crackles at the right base, no wheezing, rhonchi or rales  Abd: soft, nontender, no hepatomegaly  Ext: Trivial edema bilaterally.  Skin: warm and dry  Neuro:  CNs 2-12 intact, no focal abnormalities noted  EKG:  NSR, HR 74, normal axis, PVCs, nonspecific ST-T wave changes     ECHO: 12/10/2013  - Left ventricle: The cavity size was moderately dilated. Wall thickness was normal. Systolic function was normal. The estimated ejection fraction was in the range of 50% to 55%. - Left atrium: The atrium was mildly dilated.  TTE: 01/23/15 - Left ventricle: The cavity size was normal. Systolic function was normal. The estimated ejection fraction was in the range of 50% to 55%. There is hypokinesis of the basal-midinferior myocardium. Doppler parameters are consistent with abnormal left ventricular relaxation (grade 1 diastolic  dysfunction). - Ventricular septum: Septal motion showed paradox. - Mitral valve: Mildly calcified annulus. There was mild regurgitation. - Left atrium: The atrium was mildly dilated.  Impressions:  - Compared to the prior study, there has been no significant interval change.  EKG performed today 06/04/17 showed wandering pacemaker with 3 different morphology P waves and ventricular rate 59 bpm unchanged from prior.    ASSESSMENT AND PLAN:  1. Chronic systolic CHF - he is euvolemic, continue Lasix 40 mg PRN  2. Atrial fibrillation:  Not in a-fib, ECG shows wandering PM again. Xarelto tolerated well, no bleeding.   3. Coronary artery disease:  Presumed given WMA on echo and scar on Nuclear study, however he is asymptomatic.  Continue med Rx.  He is not on ASA as he is on Xarelto.  Continue beta blocker, statin. No side effects from statin. Muscle pain related to back pain.  4.   NICM (nonischemic cardiomyopathy):  His EF has improved to 50-55% with lisinopril, discontinued for worsening CK D stage III, last in 10/16 1.6. He is well compensated and asymptomatic. NYHA I. No need to repeat Echo.  5.   Hypertension:  Controlled, continue current regimen.  6.  HLP:  Continue statin. Labs followed by his primary care physician.  7.  CKD (chronic kidney disease), stage III:  As above.    Follow-up in 6 months.  Ena Dawley 09/29/2017 1:42 PM

## 2017-10-03 ENCOUNTER — Other Ambulatory Visit: Payer: Self-pay | Admitting: Ophthalmology

## 2017-10-03 DIAGNOSIS — H0589 Other disorders of orbit: Secondary | ICD-10-CM

## 2017-10-11 ENCOUNTER — Ambulatory Visit
Admission: RE | Admit: 2017-10-11 | Discharge: 2017-10-11 | Disposition: A | Discharge status: Home / Self Care | Payer: Medicare HMO | Source: Ambulatory Visit | Attending: Ophthalmology | Admitting: Ophthalmology

## 2017-10-11 DIAGNOSIS — H0589 Other disorders of orbit: Secondary | ICD-10-CM

## 2017-10-11 MED ORDER — GADOBENATE DIMEGLUMINE 529 MG/ML IV SOLN
10.0000 mL | Freq: Once | INTRAVENOUS | Status: AC | PRN
  Administered 2017-10-11: 10 mL via INTRAVENOUS

## 2017-10-14 ENCOUNTER — Other Ambulatory Visit: Payer: Self-pay | Admitting: Cardiology

## 2017-10-24 DIAGNOSIS — H10413 Chronic giant papillary conjunctivitis, bilateral: Secondary | ICD-10-CM | POA: Diagnosis not present

## 2017-10-24 DIAGNOSIS — H11421 Conjunctival edema, right eye: Secondary | ICD-10-CM | POA: Diagnosis not present

## 2017-10-25 ENCOUNTER — Other Ambulatory Visit: Payer: Self-pay | Admitting: Cardiology

## 2017-11-14 DIAGNOSIS — R351 Nocturia: Secondary | ICD-10-CM | POA: Diagnosis not present

## 2017-11-14 DIAGNOSIS — N3941 Urge incontinence: Secondary | ICD-10-CM | POA: Diagnosis not present

## 2017-11-14 DIAGNOSIS — N401 Enlarged prostate with lower urinary tract symptoms: Secondary | ICD-10-CM | POA: Diagnosis not present

## 2017-11-18 ENCOUNTER — Telehealth: Payer: Self-pay | Admitting: *Deleted

## 2017-11-18 NOTE — Telephone Encounter (Signed)
Patient called and requested xarelto samples as he is unable to afford the medication at this time. I mentioned patient assistance and he would like an application. Looks like he has applied in the past and per patient he was approved. He is aware that I will place the application along with two weeks of samples at the front desk and that upon receipt of the completed application and required documents we would be able to provide him with additional samples if they are available. He verbalized his understanding and appreciation.

## 2018-02-02 DIAGNOSIS — F411 Generalized anxiety disorder: Secondary | ICD-10-CM | POA: Diagnosis not present

## 2018-02-02 DIAGNOSIS — I480 Paroxysmal atrial fibrillation: Secondary | ICD-10-CM | POA: Diagnosis not present

## 2018-02-02 DIAGNOSIS — I519 Heart disease, unspecified: Secondary | ICD-10-CM | POA: Diagnosis not present

## 2018-02-02 DIAGNOSIS — Z23 Encounter for immunization: Secondary | ICD-10-CM | POA: Diagnosis not present

## 2018-02-02 DIAGNOSIS — E441 Mild protein-calorie malnutrition: Secondary | ICD-10-CM | POA: Diagnosis not present

## 2018-02-02 DIAGNOSIS — D692 Other nonthrombocytopenic purpura: Secondary | ICD-10-CM | POA: Diagnosis not present

## 2018-02-02 DIAGNOSIS — N183 Chronic kidney disease, stage 3 (moderate): Secondary | ICD-10-CM | POA: Diagnosis not present

## 2018-02-02 DIAGNOSIS — N401 Enlarged prostate with lower urinary tract symptoms: Secondary | ICD-10-CM | POA: Diagnosis not present

## 2018-02-02 DIAGNOSIS — I1310 Hypertensive heart and chronic kidney disease without heart failure, with stage 1 through stage 4 chronic kidney disease, or unspecified chronic kidney disease: Secondary | ICD-10-CM | POA: Diagnosis not present

## 2018-02-02 DIAGNOSIS — E1159 Type 2 diabetes mellitus with other circulatory complications: Secondary | ICD-10-CM | POA: Diagnosis not present

## 2018-02-02 DIAGNOSIS — Z7901 Long term (current) use of anticoagulants: Secondary | ICD-10-CM | POA: Diagnosis not present

## 2018-02-05 ENCOUNTER — Other Ambulatory Visit: Payer: Self-pay | Admitting: Neurology

## 2018-02-25 ENCOUNTER — Telehealth: Payer: Self-pay | Admitting: Cardiology

## 2018-02-25 NOTE — Telephone Encounter (Signed)
Spoke with patient and he stated that his refill for this month is very expensive and he cannot pick it up as he has not received his check yet. I mentioned patient assistance but he stated that he handling that by applying for medicaid that will help cover the cost of this medication. He is aware that I will place two weeks of samples at the front desk.

## 2018-02-25 NOTE — Telephone Encounter (Signed)
New Message   Patient calling the office for samples of medication:   1.  What medication and dosage are you requesting samples for? Rivaroxaban (XARELTO) 15 MG TABS tablet  2.  Are you currently out of this medication? yes

## 2018-03-13 ENCOUNTER — Encounter: Payer: Self-pay | Admitting: Physician Assistant

## 2018-03-18 ENCOUNTER — Ambulatory Visit (INDEPENDENT_AMBULATORY_CARE_PROVIDER_SITE_OTHER): Payer: Medicare HMO | Admitting: Neurology

## 2018-03-18 ENCOUNTER — Encounter: Payer: Self-pay | Admitting: Neurology

## 2018-03-18 VITALS — BP 108/64 | HR 62 | Ht 70.0 in | Wt 216.0 lb

## 2018-03-18 DIAGNOSIS — G500 Trigeminal neuralgia: Secondary | ICD-10-CM

## 2018-03-18 MED ORDER — GABAPENTIN 300 MG PO CAPS: ORAL_CAPSULE | ORAL | 3 refills | Status: DC

## 2018-03-18 NOTE — Patient Instructions (Addendum)
Adjust gabapentin to Gabapentin 300 mg tablets    Morning       Afternoon        Evening  Week 1 1 tab        1 tab                  1 tab              Week 2 1 tab           1 tab         2 tab              Week 3 2 tab           1 tab             2 tab         Week 4 2 tab           2 tab            2 tab         Call with update in 1 month, to determine further increase in medication.  If you develop increased sleepiness, stay at the lower dose.           Return to clinic in 3 months

## 2018-03-18 NOTE — Progress Notes (Signed)
Follow-up Visit   Date: 03/18/18    AURTHER HARLIN MRN: [id number] DOB: 11/15/1940   Interim History: Erik [name] is a 77 y.o. right-handed Caucasian male with atrial fibrillation, hyperlipidemia, hypertension, GERD, CAD, CHF (EF 30%), diabetes mellitus, and BPH returning to the clinic for follow-up of right trigeminal neuralgia.  The patient was accompanied to the clinic by self.  History of present illness: Since 2000, he had had three spells of trigeminal neuralgia, the most recent episode started 6 months ago. He has extreme sensitivity of the right temple, jaw, cheek, lips, and nose on the right side.  It feels as if he is touching an electric fence.  Symptoms are intermittent and occur daily, lasting about 5-15 seconds.  If he tries to be still, he feels that it alleviates pain quicker.  His pain is exacerbated by brushing his teeth, combing hair, shampooing on the right scalp, and chewing. He was started on carbamazepine 100mg  twice daily and does not appreciate any improvement.  In the past, he used Tegretol and had symptoms resolve within 6 weeks.    UPDATE 11/28/2016:    He did not have any benefit with Tegretol 200mg  BID so he was started on gabapentin 300mg  BID and endorses marked improvement.  He now gets very transient facial pain, lasting only a few seconds and occurring once every few weeks.  He has no new complaints.   UPDATE 03/18/2018:  He is here for follow-up visit.   He was doing well on gabapentin 300mg  twice daily and around the summer, his right trigeminal neuralgia has becomes more painful.  He gets spells of pain multiple times daily and can be triggered by chewing, talking, and tactile stimuli.  He does not have facial weakness or numbness/tingling.  Pain is worse over temple and cheek.    Medications:  Current Outpatient Medications on File Prior to Visit  Medication Sig Dispense Refill  . acetaminophen (TYLENOL) 325 MG tablet Take 2 tablets (650 mg total)  by mouth every 6 (six) hours as needed for mild pain (or Fever >/= 101).    [name] atorvastatin (LIPITOR) 40 MG tablet Take 1 tablet (40 mg total) by mouth daily at 6 PM. 90 tablet 3  . CARTIA XT 120 MG 24 hr capsule TAKE 1 CAPSULE(120 MG) BY MOUTH DAILY 90 capsule 3  . citalopram (CELEXA) 40 MG tablet Take 40 mg by mouth daily.  6  . clonazePAM (KLONOPIN) 0.5 MG tablet Take 0.5 mg by mouth daily as needed.   0  . Coenzyme Q10 (COQ10) 200 MG CAPS Take 100 mg by mouth daily.     . finasteride (PROSCAR) 5 MG tablet Take 5 mg by mouth once daily  3  . fluticasone (FLONASE) 50 MCG/ACT nasal spray Place 2 sprays into both nostrils daily.  2  . furosemide (LASIX) 40 MG tablet TAKE 1 TABLET(40 MG) BY MOUTH DAILY AS NEEDED FOR SWELLING 90 tablet 1  . metoprolol tartrate (LOPRESSOR) 50 MG tablet TAKE 1 AND 1/2 TABLETS(75 MG) BY MOUTH TWICE DAILY 90 tablet 6  . omeprazole (PRILOSEC) 20 MG capsule Take 20 mg by mouth daily.    [name] oxyCODONE (OXY IR/ROXICODONE) 5 MG immediate release tablet Take 1 tablet (5 mg total) by mouth every 6 (six) hours as needed for severe pain. 30 tablet 0  . Rivaroxaban (XARELTO) 15 MG TABS tablet TAKE 1 TABLET(15 MG) BY MOUTH DAILY WITH SUPPER 90 tablet 3  . tamsulosin (FLOMAX) 0.4 MG CAPS  capsule Take 0.4 mg by mouth at bedtime.    [name] SOLOSTAR 300 UNIT/ML SOPN Inject 36 Units as directed daily.   4   No current facility-administered medications on file prior to visit.     Allergies:  Allergies  Allergen Reactions  . Claritin [Loratadine] Other (See Comments)    Urinary retention  . Tradjenta [Linagliptin] Other (See Comments)    High blood sugar    Review of Systems:  CONSTITUTIONAL: No fevers, chills, night sweats, or weight loss.  EYES: No visual changes or eye pain ENT: No hearing changes.  No history of nose bleeds.   RESPIRATORY: No cough, wheezing and shortness of breath.   CARDIOVASCULAR: Negative for chest pain, and palpitations.   GI: Negative for  abdominal discomfort, blood in stools or black stools.  No recent change in bowel habits.   GU:  No history of incontinence.   MUSCLOSKELETAL: No history of joint pain or swelling.  No myalgias.   SKIN: Negative for lesions, rash, and itching.   ENDOCRINE: Negative for cold or heat intolerance, polydipsia or goiter.   PSYCH:  No depression or anxiety symptoms.   NEURO: As Above.   Vital Signs:  BP 108/64   Pulse 62   Ht 5\' 10"  (1.778 m)   Wt 216 lb (98 kg)   SpO2 95%   BMI 30.99 kg/m   General Medical Exam:   General:  Well appearing, comfortable  Eyes/ENT: see cranial nerve examination.   Neck: No masses appreciated.  No carotid bruits. Respiratory:  Clear to auscultation, good air entry bilaterally.   Cardiac:  Regular rate and rhythm, no murmur.   Ext:  No edema  Neurological Exam: MENTAL STATUS including orientation to time, place, person, recent and remote memory, attention span and concentration, language, and fund of knowledge is normal.  Speech is not dysarthric.  CRANIAL NERVES:  Pupils equal round and reactive to light.  Normal conjugate, extra-ocular eye movements in all directions of gaze.  No ptosis. Normal facial sensation.  Face is symmetric. Palate elevates symmetrically.  Tongue is midline. Smile triggers right facial pain.    MOTOR:  Motor strength is 5/5 in all extremities.    COORDINATION/GAIT:   Gait narrow based and stable.   Data:  MRI orbit wwo contrast 10/11/2017: Negative for orbital mass. Question mild enhancement of the eyelid bilaterally which could be due to inflammation. This is symmetric and could also be due to artifact on the postcontrast images related to fat suppression technique. 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   IMPRESSION/PLAN: Right idiopathic trigeminal neuralgia, worsening pain.  He had MRI orbit in May 2019 which does not show trigeminal nerve compression/vascular loop, imaging was reviewed with radiology.   Initially, tegretol controlled pain, however, he did not appreciate ongoing improvement with medication titration, so was transitioned to gabapentin.   Symptoms had been well controlled on gabapentin 300mg  BID until earlier this summer and now he has daily spells of severe lancinating pain, disrupting his quality of life. I will titrate gabapentin by 300mg  each week to 600mg  TID.  Side effects discussed.  He was asked to call my office with an upate in 1 month.  If pain remains poorly controlled, add low dose tegretol.  I will see him back in the office in 3 months   Thank you for allowing me to participate in patient's care.  If I can answer any additional questions, I would be pleased to do so.  Sincerely,    Donika K. Posey Pronto, DO

## 2018-03-21 ENCOUNTER — Other Ambulatory Visit: Payer: Self-pay | Admitting: Cardiology

## 2018-03-21 DIAGNOSIS — I10 Essential (primary) hypertension: Secondary | ICD-10-CM

## 2018-03-21 DIAGNOSIS — I5020 Unspecified systolic (congestive) heart failure: Secondary | ICD-10-CM

## 2018-03-21 DIAGNOSIS — I480 Paroxysmal atrial fibrillation: Secondary | ICD-10-CM

## 2018-03-24 ENCOUNTER — Other Ambulatory Visit: Payer: Self-pay | Admitting: *Deleted

## 2018-03-24 ENCOUNTER — Other Ambulatory Visit: Payer: Self-pay | Admitting: Cardiology

## 2018-03-24 DIAGNOSIS — I5020 Unspecified systolic (congestive) heart failure: Secondary | ICD-10-CM

## 2018-03-24 DIAGNOSIS — I10 Essential (primary) hypertension: Secondary | ICD-10-CM

## 2018-03-24 DIAGNOSIS — I480 Paroxysmal atrial fibrillation: Secondary | ICD-10-CM

## 2018-03-24 MED ORDER — METOPROLOL TARTRATE 50 MG PO TABS: ORAL_TABLET | ORAL | 6 refills | Status: DC

## 2018-03-24 MED ORDER — RIVAROXABAN 15 MG PO TABS: ORAL_TABLET | ORAL | 3 refills | Status: DC

## 2018-03-24 NOTE — Telephone Encounter (Signed)
Xarelto 15mg  paper refill request received from [facility]; pt is 77 yrs old, 98kg, Crea-1.90 on 02/02/18 via [facility], last seen by Dr. [name] on 09/29/17, CrCl-43.51ml/min; will send in refill to requested pharmacy.

## 2018-03-24 NOTE — Addendum Note (Signed)
Addended by: Derrel Nip B on: 03/24/2018 09:02 AM   Modules accepted: Orders

## 2018-03-31 ENCOUNTER — Encounter: Payer: Self-pay | Admitting: Physician Assistant

## 2018-03-31 DIAGNOSIS — R197 Diarrhea, unspecified: Secondary | ICD-10-CM | POA: Diagnosis not present

## 2018-03-31 DIAGNOSIS — E1129 Type 2 diabetes mellitus with other diabetic kidney complication: Secondary | ICD-10-CM | POA: Diagnosis not present

## 2018-03-31 DIAGNOSIS — I10 Essential (primary) hypertension: Secondary | ICD-10-CM | POA: Diagnosis not present

## 2018-03-31 DIAGNOSIS — G500 Trigeminal neuralgia: Secondary | ICD-10-CM | POA: Diagnosis not present

## 2018-03-31 DIAGNOSIS — Z6831 Body mass index (BMI) 31.0-31.9, adult: Secondary | ICD-10-CM | POA: Diagnosis not present

## 2018-03-31 DIAGNOSIS — I480 Paroxysmal atrial fibrillation: Secondary | ICD-10-CM | POA: Diagnosis not present

## 2018-03-31 DIAGNOSIS — N183 Chronic kidney disease, stage 3 (moderate): Secondary | ICD-10-CM | POA: Diagnosis not present

## 2018-03-31 DIAGNOSIS — Z7901 Long term (current) use of anticoagulants: Secondary | ICD-10-CM | POA: Diagnosis not present

## 2018-03-31 NOTE — Progress Notes (Signed)
Cardiology Office Note    Date:  04/01/2018  ID:  [name], [name] Feb 22, 1941, MRN [id number] PCP:  [name], MD  Cardiologist:  Ena Dawley, MD   Chief Complaint: 6 month f/u afib, CHF  History of Present Illness:  Erik [name] is a 77 y.o. male with history of no significant CAD on cath in 2004, PVCs, PAF, NICM (variable EF over the years), chronic combined CHF, CKD stage III, DM, HTN, HL (followed by PCP) who presents for 6 month follow-up.   He underwent cath in 2004 for EF 30-35% with minimal irregularities in the LAD otherwise no significant disease overall. EF was 35-40% in 2009, 55% in 2010, then back down to 35-40% in 2015 during admission for AF-RVR. At that time he spontaneously converted to NSR on IV diltiazem. Cardiac enzymes remained normal. He was placed on Xarelto  Echocardiogram demonstrated worsening LV function with an EF of 35-40% and inferior and inferolateral hypokinesis. Patient was set up for a nuclear study as an inpatient which demonstrated mid to apical anteroseptal perfusion defect suggestive of possible infarct but no ischemia, EF 48%. This was felt to be a low risk study. Decision was made to treat him medically. F/u echo 12/2014 showed EF 50-55%, hypokinesis of the basal mid inferior myocardium, grade 1 DD, mild MR, mild LAE. ACEI had previously been discontinued due to CKD and worsening renal function once reintroduced. Last labs 08/2017 normal TSH, Hgb 13.2, plt 123 (intermittently low in the past too), Cr 1.6, K 4.7, 06/2017 LDL 58 (PCP). KPN does indicate repeat labs 01/2018 showed Cr 1.9, Hgb 13.5, no platelet level resulted.  He returns for follow-up alone today overall feeling fine from cardiac standpoint without CP, SOB, palpitations, dizziness, or syncope. No recent falls He has been working with PCP on uptitration of gabapentin for trigeminal neuralgia (carbamazepime remotely discontinued due to interaction with Xarelto). He had a dark stool 1  week ago which he attributed to the med change, but I'm not sure that was related. He saw PCP yesterday for general indigestion/abdominal "wellbeing" and was started on PPI. He states iron studies were done. He took Pepto bismol around the time of the dark stool but he thinks it was after the fact. Stool is totally back to normal now per his report.   Past Medical History:  Diagnosis Date  . Chronic combined systolic and diastolic CHF (congestive heart failure) ([place])   . CKD (chronic kidney disease), stage III ([place])   . Coronary artery disease    a. Nonobstructive by cath 2004. b. Nuc 09/2013 - no ischemia, fixed defect suggestive of possible prior infarction, felt low risk, EF 48%.   . Diabetes mellitus   . GERD (gastroesophageal reflux disease)   . Hyperlipidemia   . Hypertension   . NICM (nonischemic cardiomyopathy) ([place])    a. EF 35-40% in 2009, 55% in 2010. b. 09/2013: 35-40% by echo and 48% by nuc. c. EF 50-55% by echo 12/2014.  [name] PAF (paroxysmal atrial fibrillation) ([place])    a. 09/2013: RVR then bradycardia then spont conv to NSR.  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 Premature ventricular contractions     Past Surgical History:  Procedure Laterality Date  . CARDIAC CATHETERIZATION  10/25/00, 12/29/02  . CHOLECYSTECTOMY N/A 01/25/2015   Procedure: LAPAROSCOPIC CHOLECYSTECTOMY ;  Surgeon: [name], MD;  Location: [facility];  Service: General;  Laterality: N/A;  . SHOULDER SURGERY Left 2011  Shoulder surgery, rod inserted    Current Medications: Current Meds  Medication Sig  . acetaminophen (TYLENOL) 325 MG tablet Take 2 tablets (650 mg total) by mouth every 6 (six) hours as needed for mild pain (or Fever >/= 101).  [name] atorvastatin (LIPITOR) 40 MG tablet Take 1 tablet (40 mg total) by mouth daily at 6 PM.  . BD PEN NEEDLE NANO U/F 32G X 4 MM MISC USE 1 NEEDLE WITH INSULIN INJECTION ONCE DAILY  . CARTIA XT 120 MG 24 hr capsule TAKE 1 CAPSULE(120 MG) BY MOUTH DAILY  .  citalopram (CELEXA) 40 MG tablet Take 40 mg by mouth daily.  . clonazePAM (KLONOPIN) 0.5 MG tablet Take 0.5 mg by mouth daily as needed.   . Coenzyme Q10 (COQ10) 200 MG CAPS Take 100 mg by mouth daily.   . finasteride (PROSCAR) 5 MG tablet Take 5 mg by mouth once daily  . furosemide (LASIX) 40 MG tablet TAKE 1 TABLET(40 MG) BY MOUTH DAILY AS NEEDED FOR SWELLING  . gabapentin (NEURONTIN) 300 MG capsule Take 2 tablets three times daily  . metoprolol tartrate (LOPRESSOR) 50 MG tablet TAKE 1 AND 1/2 TABLETS(75 MG) BY MOUTH TWICE DAILY  . omeprazole (PRILOSEC) 20 MG capsule Take 20 mg by mouth daily.  . Rivaroxaban (XARELTO) 15 MG TABS tablet TAKE 1 TABLET(15 MG) BY MOUTH DAILY WITH SUPPER  . tamsulosin (FLOMAX) 0.4 MG CAPS capsule Take 0.4 mg by mouth at bedtime.  [name] SOLOSTAR 300 UNIT/ML SOPN Inject 36 Units as directed daily.       Allergies:   Claritin [loratadine] and Tradjenta [linagliptin]   Social History   Socioeconomic History  . Marital status: Married    Spouse name: Not on file  . Number of children: Not on file  . Years of education: Not on file  . Highest education level: Not on file  Occupational History  . Occupation: retired    Comment: from [employer]  Social Needs  . Financial resource strain: Not on file  . Food insecurity:    Worry: Not on file    Inability: Not on file  . Transportation needs:    Medical: Not on file    Non-medical: Not on file  Tobacco Use  . Smoking status: Former Smoker    Packs/day: 0.50    Years: 10.00    Pack years: 5.00    Types: Cigarettes    Last attempt to quit: 01/21/1985    Years since quitting: 33.2  . Smokeless tobacco: Never Used  . Tobacco comment: quit 35 yrs ago  Substance and Sexual Activity  . Alcohol use: No  . Drug use: No  . Sexual activity: Not Currently    Birth control/protection: None  Lifestyle  . Physical activity:    Days per week: Not on file    Minutes per session: Not on file  . Stress: Not  on file  Relationships  . Social connections:    Talks on phone: Not on file    Gets together: Not on file    Attends religious service: Not on file    Active member of club or organization: Not on file    Attends meetings of clubs or organizations: Not on file    Relationship status: Not on file  Other Topics Concern  . Not on file  Social History Narrative   Lives with wife in a one story home.  Has 3 children.  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  Education: high school.     Family History:  The patient's family history includes Diabetes in his brother and mother; Heart disease in his brother, father, and mother; Hypertension in his brother and mother. There is no history of Heart attack or Stroke.  ROS:   Please see the history of present illness. Otherwise, review of systems is positive for hard of hearing.  All other systems are reviewed and otherwise negative.    PHYSICAL EXAM:   VS:  BP (!) 124/50   Pulse 68   Ht 5\' 10"  (1.778 m)   Wt 209 lb (94.8 kg)   SpO2 92%   BMI 29.99 kg/m   BMI: Body mass index is 29.99 kg/m. GEN: Well nourished, well developed WM in no acute distress HEENT: normocephalic, atraumatic Neck: no JVD, carotid bruits, or masses Cardiac: RRR; no murmurs, rubs, or gallops, no edema  Respiratory:  clear to auscultation bilaterally, normal work of breathing GI: soft, nontender, nondistended, + BS MS: no deformity or atrophy Skin: warm and dry, no rash Neuro:  Alert and Oriented x 3, Strength and sensation are intact, follows commands, hard of hearing Psych: euthymic mood, full affect  Wt Readings from Last 3 Encounters:  04/01/18 209 lb (94.8 kg)  03/18/18 216 lb (98 kg)  09/29/17 206 lb 9.6 oz (93.7 kg)      Studies/Labs Reviewed:   EKG:  EKG was ordered today and personally reviewed by me and demonstrates NSR 60bpm, nonspecific ST-T Changes. QTc listed as prolonged, but personally measured in lead V2 - QTc 425ms. Unusual  P wave appearance with low voltage, but similar to prior.  Recent Labs: 09/22/2017: ALT 13; BUN 13; Creatinine, Ser 1.60; Hemoglobin 13.2; Platelets 123; Potassium 4.7; Sodium 143; TSH 2.470   Lipid Panel No results found for: CHOL, TRIG, HDL, CHOLHDL, VLDL, LDLCALC, LDLDIRECT  Additional studies/ records that were reviewed today include: Summarized above.  ASSESSMENT & PLAN:   1. Paroxysmal atrial fibrillation - maintaining NSR on present regimen. Continue. Bleeding precautions reviewed. Update CBC given dark stool (no CBC crossing over on today's KPN but he does state PCP checked iron). Update BMET as well given variable CrCl this year. If he has recurrent dark stool I told him to call PCP ASAP as he would require GI eval. He requests samples of Xarelto today so have asked nurse to check. As in previous years he finds this pricey. I did ask him to confer with his insurance company to find out if perhaps they would better cover an alternative like Eliquis. He will check. Patient assistance was recently offered but he declined, citing he as going to apply for Medicaid to help cover the cost. This can be revisited if needed. 2. NICM/Chronic combined CHF - appears euvolemic at present time. Continue to monitor. Most recent LVEF improved. EKG nonspecific changes but similar to prior. 3. CKD stage III - recheck today given that his Cr has been on the border recently with regard to Xarelto dosing (1.6 previously with CrCl 51, then 1.9 in 01/2018 with CrCl 43). Not on ACEI/ARB/spiro/ARNI due to CKD - has been re-trialed in the past with worsening function. He is on diltiazem but it does appear his EF has previously stabilized with control of AF, so will continue for now. 4. Essential HTN - BP controlled. 5. PVCs - listed in chart; quiescent.  Disposition: F/u with Dr. [name] in 6 months.  Medication Adjustments/Labs and Tests Ordered: Current medicines are reviewed at length with the patient  today.   Concerns regarding medicines are outlined above. Medication changes, Labs and Tests ordered today are summarized above and listed in the Patient Instructions accessible in Encounters.   Signed, Charlie Pitter, PA-C  04/01/2018 10:29 AM    [address], [place], [place]  [address] Phone: [phone]; Fax: [phone]

## 2018-04-01 ENCOUNTER — Encounter: Payer: Self-pay | Admitting: Physician Assistant

## 2018-04-01 ENCOUNTER — Ambulatory Visit (INDEPENDENT_AMBULATORY_CARE_PROVIDER_SITE_OTHER): Payer: Medicare HMO | Admitting: Physician Assistant

## 2018-04-01 ENCOUNTER — Telehealth: Payer: Self-pay | Admitting: *Deleted

## 2018-04-01 VITALS — BP 124/50 | HR 68 | Ht 70.0 in | Wt 209.0 lb

## 2018-04-01 DIAGNOSIS — N183 Chronic kidney disease, stage 3 unspecified: Secondary | ICD-10-CM

## 2018-04-01 DIAGNOSIS — I10 Essential (primary) hypertension: Secondary | ICD-10-CM | POA: Diagnosis not present

## 2018-04-01 DIAGNOSIS — I5042 Chronic combined systolic (congestive) and diastolic (congestive) heart failure: Secondary | ICD-10-CM

## 2018-04-01 DIAGNOSIS — I493 Ventricular premature depolarization: Secondary | ICD-10-CM

## 2018-04-01 DIAGNOSIS — I480 Paroxysmal atrial fibrillation: Secondary | ICD-10-CM

## 2018-04-01 DIAGNOSIS — N184 Chronic kidney disease, stage 4 (severe): Secondary | ICD-10-CM

## 2018-04-01 LAB — BASIC METABOLIC PANEL
BUN / CREAT RATIO: 11 (ref 10–24)
BUN: 18 mg/dL (ref 8–27)
CO2: 25 mmol/L (ref 20–29)
Calcium: 8 mg/dL — ABNORMAL LOW (ref 8.6–10.2)
Chloride: 104 mmol/L (ref 96–106)
Creatinine, Ser: 1.71 mg/dL — ABNORMAL HIGH (ref 0.76–1.27)
GFR calc non Af Amer: 37 mL/min/{1.73_m2} — ABNORMAL LOW (ref >59)
GFR, EST AFRICAN AMERICAN: 43 mL/min/{1.73_m2} — AB (ref >59)
GLUCOSE: 195 mg/dL — AB (ref 65–99)
Potassium: 4 mmol/L (ref 3.5–5.2)
SODIUM: 142 mmol/L (ref 134–144)

## 2018-04-01 LAB — CBC
HEMOGLOBIN: 13.1 g/dL (ref 13.0–17.7)
Hematocrit: 39.3 % (ref 37.5–51.0)
MCH: 31.3 pg (ref 26.6–33.0)
MCHC: 33.3 g/dL (ref 31.5–35.7)
MCV: 94 fL (ref 79–97)
PLATELETS: 141 10*3/uL — AB (ref 150–450)
RBC: 4.19 x10E6/uL (ref 4.14–5.80)
RDW: 12.8 % (ref 12.3–15.4)
WBC: 5.8 10*3/uL (ref 3.4–10.8)

## 2018-04-01 NOTE — Telephone Encounter (Signed)
-----   Message from Charlie Pitter, [place] sent at 04/01/2018  4:16 PM EST ----- Please let patient know labs stable. Platelets are a smidge low but similar if not improved from prior. Blood count normal. Any recurrent dark stools need to be reported to PCP immediately; if multiple stools noted -> ER. Glad to hear this resolved. Creatinine is stable. For my records - I calculated his clearance and dose of Xarelto is appropriate. Can you find out if he sees a nephrologist? Although not an acute issue would be great for him to get plugged in given his chronic kidney disease. Should also be reminded patients taking blood thinners or with kidney disease should generally stay away from medicines like ibuprofen, Advil, Motrin, naproxen, and Aleve due to risk of stomach bleeding. He may take Tylenol as directed or talk to primary doctor about alternatives.  Dayna Dunn PA-C   Dayna Dunn PA-C

## 2018-04-01 NOTE — Patient Instructions (Signed)
Medication Instructions:  Your physician recommends that you continue on your current medications as directed. Please refer to the Current Medication list given to you today.  If you need a refill on your cardiac medications before your next appointment, please call your pharmacy.   Lab work: TODAY:  BMET & CBC  If you have labs (blood work) drawn today and your tests are completely normal, you will receive your results only by: [name] MyChart Message (if you have MyChart) OR . A paper copy in the mail If you have any lab test that is abnormal or we need to change your treatment, we will call you to review the results.  Testing/Procedures: None ordered  Follow-Up: At [facility], you and your health needs are our priority.  As part of our continuing mission to provide you with exceptional heart care, we have created designated Provider Care Teams.  These Care Teams include your primary Cardiologist (physician) and Advanced Practice Providers (APPs -  Physician Assistants and Nurse Practitioners) who all work together to provide you with the care you need, when you need it. You will need a follow up appointment in 6 months.  Please call our office 2 months in advance to schedule this appointment.  You may see Ena Dawley, MD or one of the following Advanced Practice Providers on your designated Care Team:   [place], PA-C Melina Copa, PA-C . Ermalinda Barrios, PA-C  Any Other Special Instructions Will Be Listed Below (If Applicable).  If you have any more black stool, call your primary care physician ASAP.

## 2018-04-13 ENCOUNTER — Other Ambulatory Visit: Payer: Self-pay | Admitting: Cardiology

## 2018-04-13 DIAGNOSIS — I5020 Unspecified systolic (congestive) heart failure: Secondary | ICD-10-CM

## 2018-04-13 DIAGNOSIS — I480 Paroxysmal atrial fibrillation: Secondary | ICD-10-CM

## 2018-04-13 DIAGNOSIS — I10 Essential (primary) hypertension: Secondary | ICD-10-CM

## 2018-04-24 DIAGNOSIS — Z0100 Encounter for examination of eyes and vision without abnormal findings: Secondary | ICD-10-CM | POA: Diagnosis not present

## 2018-05-04 ENCOUNTER — Ambulatory Visit (HOSPITAL_COMMUNITY)
Admission: EM | Admit: 2018-05-04 | Discharge: 2018-05-04 | Disposition: A | Discharge status: Home / Self Care | Payer: Medicare HMO | Attending: Emergency Medicine | Admitting: Emergency Medicine

## 2018-05-04 ENCOUNTER — Telehealth: Payer: Self-pay | Admitting: Neurology

## 2018-05-04 ENCOUNTER — Encounter (HOSPITAL_COMMUNITY): Payer: Self-pay | Admitting: Emergency Medicine

## 2018-05-04 DIAGNOSIS — K029 Dental caries, unspecified: Secondary | ICD-10-CM

## 2018-05-04 DIAGNOSIS — S025XXD Fracture of tooth (traumatic), subsequent encounter for fracture with routine healing: Secondary | ICD-10-CM | POA: Diagnosis not present

## 2018-05-04 DIAGNOSIS — K047 Periapical abscess without sinus: Secondary | ICD-10-CM

## 2018-05-04 DIAGNOSIS — K0889 Other specified disorders of teeth and supporting structures: Secondary | ICD-10-CM | POA: Diagnosis not present

## 2018-05-04 MED ORDER — LAMOTRIGINE 25 MG PO TABS: ORAL_TABLET | ORAL | 3 refills | Status: DC

## 2018-05-04 MED ORDER — AMOXICILLIN 500 MG PO CAPS: 500.0000 mg | ORAL_CAPSULE | Freq: Three times a day (TID) | ORAL | 0 refills | Status: DC

## 2018-05-04 MED ORDER — ACETAMINOPHEN 325 MG PO TABS
650.0000 mg | ORAL_TABLET | Freq: Once | ORAL | Status: AC
  Administered 2018-05-04: 650 mg via ORAL

## 2018-05-04 MED ORDER — ACETAMINOPHEN 325 MG PO TABS
ORAL_TABLET | ORAL | Status: AC
  Filled 2018-05-04: qty 2

## 2018-05-04 NOTE — ED Provider Notes (Signed)
[facility]    CSN: [id number] Arrival date & time: 05/04/18  1709     History   Chief Complaint Chief Complaint  Patient presents with  . Oral Swelling    HPI Erik [name] is a 77 y.o. male.   LT side tooth pain for 3 days now. States that the swelling did not start until this am. States that he has chronic teeth problems. Needs another one pulled to rt RT side. Pt does have a dentist but is on blood thinner and needs to be off 5 days prior to them pulling tooth. Low grade fever. No n/v/d.      Past Medical History:  Diagnosis Date  . Chronic combined systolic and diastolic CHF (congestive heart failure) ([place])   . CKD (chronic kidney disease), stage III ([place])   . Coronary artery disease    a. Nonobstructive by cath 2004. b. Nuc 09/2013 - no ischemia, fixed defect suggestive of possible prior infarction, felt low risk, EF 48%.   . Diabetes mellitus   . GERD (gastroesophageal reflux disease)   . Hyperlipidemia   . Hypertension   . NICM (nonischemic cardiomyopathy) ([place])    a. EF 35-40% in 2009, 55% in 2010. b. 09/2013: 35-40% by echo and 48% by nuc. c. EF 50-55% by echo 12/2014.  [name] PAF (paroxysmal atrial fibrillation) ([place])    a. 09/2013: RVR then bradycardia then spont conv to NSR.  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 Premature ventricular contractions     Patient Active Problem List   Diagnosis Date Noted  . Hyperlipidemia   . Systolic CHF ([place]) 40/98/1191  . Acute biliary pancreatitis   . Acute on chronic renal failure ([place])   . Pancreatitis 01/22/2015  . Acute pancreatitis 01/22/2015  . Abdominal pain   . Gallstones   . Acute gallstone pancreatitis   . CKD (chronic kidney disease), stage III ([place])   . Atrial fibrillation ([place])   . Coronary artery disease   . NICM (nonischemic cardiomyopathy) ([place])   . Hypertension   . GERD (gastroesophageal reflux disease)   . Premature ventricular contractions   . 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 Chest pain 09/23/2013  . Cellulitis 12/02/2012  . Diabetes mellitus ([place]) 12/02/2012  . HYPERLIPIDEMIA 10/11/2008    Past Surgical History:  Procedure Laterality Date  . CARDIAC CATHETERIZATION  10/25/00, 12/29/02  . CHOLECYSTECTOMY N/A 01/25/2015   Procedure: LAPAROSCOPIC CHOLECYSTECTOMY ;  Surgeon: [name], MD;  Location: [facility];  Service: General;  Laterality: N/A;  . SHOULDER SURGERY Left 2011   Shoulder surgery, rod inserted       Home Medications    Prior to Admission medications   Medication Sig Start Date End Date Taking? Authorizing Provider  acetaminophen (TYLENOL) 325 MG tablet Take 2 tablets (650 mg total) by mouth every 6 (six) hours as needed for mild pain (or Fever >/= 101). 01/29/15   Elgergawy, Silver Huguenin, MD  amoxicillin (AMOXIL) 500 MG capsule Take 1 capsule (500 mg total) by mouth 3 (three) times daily. 05/04/18   Marney Setting, NP  atorvastatin (LIPITOR) 40 MG tablet Take 1 tablet (40 mg total) by mouth daily at 6 PM. 03/29/16   [name], MD  BD PEN NEEDLE NANO U/F 32G X 4 MM MISC USE 1 NEEDLE WITH INSULIN INJECTION ONCE DAILY 03/04/18   [provider]  CARTIA XT 120 MG 24 hr capsule TAKE 1 CAPSULE(120  MG) BY MOUTH DAILY 04/13/18   [name], MD  citalopram (CELEXA) 40 MG tablet Take 40 mg by mouth daily. 02/26/16   [provider]  clonazePAM (KLONOPIN) 0.5 MG tablet Take 0.5 mg by mouth daily as needed.  01/02/15   [provider]  Coenzyme Q10 (COQ10) 200 MG CAPS Take 100 mg by mouth daily.     [provider]  finasteride (PROSCAR) 5 MG tablet Take 5 mg by mouth once daily 03/06/17   [provider]  furosemide (LASIX) 40 MG tablet TAKE 1 TABLET(40 MG) BY MOUTH DAILY AS NEEDED FOR SWELLING 04/07/17   [name], MD  gabapentin (NEURONTIN) 300 MG capsule Take 2 tablets three times daily 03/18/18   Narda Amber K, DO  lamoTRIgine (LAMICTAL) 25 MG tablet Take 1 tablet daily x 1  week, then increase to 1 tablet twice daily.  STOP if you develop any rash 05/04/18   Narda Amber K, DO  metoprolol tartrate (LOPRESSOR) 50 MG tablet TAKE 1 AND 1/2 TABLETS(75 MG) BY MOUTH TWICE DAILY 03/24/18   [name], MD  omeprazole (PRILOSEC) 20 MG capsule Take 20 mg by mouth daily.    [provider]  Rivaroxaban (XARELTO) 15 MG TABS tablet TAKE 1 TABLET(15 MG) BY MOUTH DAILY WITH SUPPER 03/24/18   [name], MD  tamsulosin (FLOMAX) 0.4 MG CAPS capsule Take 0.4 mg by mouth at bedtime.    [provider]  TOUJEO SOLOSTAR 300 UNIT/ML SOPN Inject 36 Units as directed daily.  03/12/16   [provider]    Family History Family History  Problem Relation Age of Onset  . Heart disease Mother   . Diabetes Mother   . Hypertension Mother   . Heart disease Father   . Heart disease Brother   . Diabetes Brother   . Hypertension Brother   . Heart attack Neg Hx   . Stroke Neg Hx     Social History Social History   Tobacco Use  . Smoking status: Former Smoker    Packs/day: 0.50    Years: 10.00    Pack years: 5.00    Types: Cigarettes    Last attempt to quit: 01/21/1985    Years since quitting: 33.3  . Smokeless tobacco: Never Used  . Tobacco comment: quit 35 yrs ago  Substance Use Topics  . Alcohol use: No  . Drug use: No     Allergies   Claritin [loratadine] and Tradjenta [linagliptin]   Review of Systems Review of Systems  Constitutional: Negative.   HENT: Positive for dental problem.        Broken tooth to LT cane nine tooth , swelling to face lower jaw area   Respiratory: Negative.   Cardiovascular: Negative.   Gastrointestinal: Negative.   Musculoskeletal: Negative.   Skin:       Swelling to lt side of face with some redness to area   Neurological: Negative.      Physical Exam Triage Vital Signs ED Triage Vitals  Enc Vitals Group     BP 05/04/18 1853 (!) 141/52     Pulse Rate 05/04/18 1853 68     Resp 05/04/18  1853 20     Temp 05/04/18 1853 (!) 100.9 F (38.3 C)     Temp Source 05/04/18 1853 Oral     SpO2 05/04/18 1853 98 %     Weight --      Height --      Head  Circumference --      Peak Flow --      Pain Score 05/04/18 1849 4     Pain Loc --      Pain Edu? --      Excl. in [place]? --    No data found.  Updated Vital Signs BP (!) 141/52 (BP Location: Left Arm)   Pulse 68   Temp (!) 100.9 F (38.3 C) (Oral)   Resp 20   SpO2 98%   Visual Acuity     Physical Exam  Constitutional: He appears well-developed.  HENT:  Head: Normocephalic.  Right Ear: External ear normal.  Left Ear: External ear normal.  Nose: Nose normal.  Mouth/Throat: Oropharynx is clear and moist.  LT front tooth broken  ,   Eyes: Pupils are equal, round, and reactive to light.  Neck: Normal range of motion.  Cardiovascular: Normal rate.  Pulmonary/Chest: Effort normal.  Neurological: He is alert.  Skin: There is erythema.  +1 edema to LT jaw area  Near broken tooth no abcess seen.      UC Treatments / Results  Labs (all labs ordered are listed, but only abnormal results are displayed) Labs Reviewed - No data to display  EKG None  Radiology No results found.  Procedures Procedures (including critical care time)  Medications Ordered in UC Medications  acetaminophen (TYLENOL) tablet 650 mg (650 mg Oral Given 05/04/18 1918)    Initial Impression / Assessment and Plan / UC Course  I have reviewed the triage vital signs and the nursing notes.  Pertinent labs & imaging results that were available during my care of the patient were reviewed by me and considered in my medical decision making (see chart for details).     Will need to follow up with dentist and cardiology to stop taking blood thinner to pull tooth.  Start abx to tx infection for broken tooth  May use warm compresses and tylenol to help with pain  Final Clinical Impressions(s) / UC Diagnoses   Final diagnoses:  Closed fracture of  tooth with routine healing, subsequent encounter  Pain of tooth socket  Infected dental carries     Discharge Instructions     Will need to follow up with dentist and cardiology to stop taking blood thinner to pull tooth.  Start abx to tx infection for broken tooth  May use warm compresses and tylenol to help with pain  [place] sugar levels infection can elevate this     ED Prescriptions    Medication Sig Dispense Auth. Provider   amoxicillin (AMOXIL) 500 MG capsule Take 1 capsule (500 mg total) by mouth 3 (three) times daily. 21 capsule Marney Setting, NP     Controlled Substance Prescriptions [place] Controlled Substance Registry consulted? Not Applicable   Marney Setting, NP 05/04/18 [phone]

## 2018-05-04 NOTE — Telephone Encounter (Signed)
Unable to use carbmazepine due to drug-drug interactions with Alen Blew.  I have sent a prescription for lamictal 25mg  - take 1 tab daily x 1 week, then increase to 25mg  twice daily.  Side effects include dizziness.  If he develops any rash, stop drug immediately.  Donika K. Posey Pronto, DO

## 2018-05-04 NOTE — Discharge Instructions (Addendum)
Will need to follow up with dentist and cardiology to stop taking blood thinner to pull tooth.  Start abx to tx infection for broken tooth  May use warm compresses and tylenol to help with pain  [place] sugar levels infection can elevate this

## 2018-05-04 NOTE — Telephone Encounter (Signed)
Your last note requests for him to call in one month with an update and if his symptoms are poorly controlled, you may add tegretol.  Please advise.

## 2018-05-04 NOTE — Telephone Encounter (Signed)
Patient is calling in stating that he is in a lot of pain and that his medication is not working. Please call him back at [phone]. Thanks!

## 2018-05-04 NOTE — ED Triage Notes (Signed)
Pain and swelling to left side of face.  Patient believes there is an abscess to left lower jaw.

## 2018-05-05 NOTE — Telephone Encounter (Signed)
I called and gave patient's wife instructions for the lamictal.

## 2018-05-07 ENCOUNTER — Telehealth: Payer: Self-pay | Admitting: Cardiology

## 2018-05-07 NOTE — Telephone Encounter (Signed)
New message       [facility] Pre-operative Risk Assessment    Request for surgical clearance:  1. What type of surgery is being performed? Tooth extraction   2. When is this surgery scheduled? TBD    3. What type of clearance is required (medical clearance vs. Pharmacy clearance to hold med vs. Both)? [place]  4. Are there any medications that need to be held prior to surgery and how long?will leave up to physician to decide   5. Practice name and name of physician performing surgery? Dr. Mariana Arn, Dr. Mariana Arn DDS  6. What is your office phone number [phone]   7.   What is your office fax number [phone]  8.   Anesthesia type (None, local, MAC, general) ? local   Maryjane Hurter 05/07/2018, 11:02 AM  _________________________________________________________________   (provider comments below)

## 2018-05-12 NOTE — Telephone Encounter (Signed)
Recommend continuing Xarelto for single dental extraction. Pt does NOT require pre op abx.

## 2018-05-12 NOTE — Telephone Encounter (Signed)
Dayna just saw pt and will clear just waiting on  Pharm.

## 2018-05-12 NOTE — Telephone Encounter (Signed)
Pharm please address xarelto thanks 

## 2018-05-13 NOTE — Telephone Encounter (Signed)
   Primary Cardiologist: Ena Dawley, MD  Chart reviewed as part of pre-operative protocol coverage. Simple dental extractions are considered low risk procedures per guidelines and generally do not require any specific cardiac clearance. It is also generally accepted that for simple extractions and dental cleanings, there is no need to interrupt blood thinner therapy.   SBE prophylaxis is not required for the patient.  I will route this recommendation to the requesting party via Epic fax function and remove from pre-op pool.  Please call with questions.  Charlie Pitter, PA-C 05/13/2018, 4:25 PM

## 2018-05-14 NOTE — Telephone Encounter (Signed)
Patient called very upset that his dentist office has not heard back from [place] on how long he should hold on taking his Xarelto.  He can be reached at [phone].

## 2018-05-15 NOTE — Telephone Encounter (Signed)
Fax resent to dental office.

## 2018-05-15 NOTE — Telephone Encounter (Signed)
Tried to call Dr. Tanna Furry office to confirm if they received clearance.  Office is closed. Will leave in preop to try again on Monday, 05/18/18.

## 2018-05-18 NOTE — Telephone Encounter (Signed)
Attempted to contact dental office. They are closed until 05/25/18. LVM for them to call if they had no received the clearance information.

## 2018-05-25 ENCOUNTER — Telehealth: Payer: Self-pay | Admitting: Cardiology

## 2018-05-25 NOTE — Telephone Encounter (Signed)
Clearance manually faxed to number provided below.

## 2018-05-25 NOTE — Telephone Encounter (Signed)
Printed clearance information from 05/07/18 and will fax manually to number provided.

## 2018-05-25 NOTE — Telephone Encounter (Signed)
New message   Per Lattie Haw, need clearance information from 05/07/2018 faxed to [phone]. This clearance was not received.

## 2018-05-26 NOTE — Telephone Encounter (Signed)
Called Dr Forde Dandy office but the office is closed and will reopen on Thursday May 28, 2018. I will try to contact the office then.

## 2018-05-28 NOTE — Telephone Encounter (Signed)
Spoke with Dr Tanna Furry office and they have received the clearance letter that was faxed and has already spoke with the patient.

## 2018-06-19 ENCOUNTER — Ambulatory Visit: Payer: Medicare HMO | Admitting: Neurology

## 2018-07-02 ENCOUNTER — Other Ambulatory Visit: Payer: Self-pay | Admitting: Nephrology

## 2018-07-02 DIAGNOSIS — R809 Proteinuria, unspecified: Secondary | ICD-10-CM

## 2018-07-02 DIAGNOSIS — N183 Chronic kidney disease, stage 3 unspecified: Secondary | ICD-10-CM

## 2018-07-02 DIAGNOSIS — Q631 Lobulated, fused and horseshoe kidney: Secondary | ICD-10-CM

## 2018-07-16 ENCOUNTER — Ambulatory Visit
Admission: RE | Admit: 2018-07-16 | Discharge: 2018-07-16 | Disposition: A | Discharge status: Home / Self Care | Payer: Medicare Other | Source: Ambulatory Visit | Attending: Nephrology | Admitting: Nephrology

## 2018-07-16 DIAGNOSIS — N183 Chronic kidney disease, stage 3 unspecified: Secondary | ICD-10-CM

## 2018-07-16 DIAGNOSIS — Q631 Lobulated, fused and horseshoe kidney: Secondary | ICD-10-CM

## 2018-07-16 DIAGNOSIS — R809 Proteinuria, unspecified: Secondary | ICD-10-CM

## 2018-07-31 ENCOUNTER — Other Ambulatory Visit: Payer: Self-pay | Admitting: Neurology

## 2018-08-03 DIAGNOSIS — D696 Thrombocytopenia, unspecified: Secondary | ICD-10-CM | POA: Insufficient documentation

## 2018-08-17 ENCOUNTER — Telehealth: Payer: Self-pay | Admitting: *Deleted

## 2018-08-17 NOTE — Telephone Encounter (Signed)
Called patient to inform him that we will need to reschedule his appointment or have a follow up evisit.  Patient chose to be rescheduled for a later date.

## 2018-08-19 ENCOUNTER — Ambulatory Visit: Payer: Self-pay | Admitting: Neurology

## 2018-09-01 ENCOUNTER — Other Ambulatory Visit: Payer: Self-pay | Admitting: Neurology

## 2018-10-17 ENCOUNTER — Other Ambulatory Visit: Payer: Self-pay | Admitting: Cardiology

## 2018-12-04 ENCOUNTER — Ambulatory Visit: Payer: Self-pay | Admitting: Neurology

## 2019-01-01 ENCOUNTER — Other Ambulatory Visit: Payer: Self-pay | Admitting: Cardiology

## 2019-01-01 DIAGNOSIS — I480 Paroxysmal atrial fibrillation: Secondary | ICD-10-CM

## 2019-01-01 DIAGNOSIS — I10 Essential (primary) hypertension: Secondary | ICD-10-CM

## 2019-01-01 DIAGNOSIS — I5020 Unspecified systolic (congestive) heart failure: Secondary | ICD-10-CM

## 2019-02-08 NOTE — Progress Notes (Signed)
Cardiology Office Note    Date:  02/09/2019  ID:  Erik [name], Erik [name] 29-Oct-1940, MRN [id number] PCP:  [name], MD  Cardiologist:  Ena Dawley, MD   Chief Complaint: 1 year f/u afib, CHF  History of Present Illness:  Erik [name] is a 78 y.o. male with history of no significant CAD on cath in 2004, PVCs, PAF, NICM (variable EF over the years), chronic combined CHF, CKD stage III, DM, HTN, HL (followed by PCP) who presents for 6 month follow-up.   He underwent cath in 2004 for EF 30-35% with minimal irregularities in the LAD otherwise no significant disease overall. EF was 35-40% in 2009, 55% in 2010, then back down to 35-40% in 2015 during admission for AF-RVR. At that time he spontaneously converted to NSR on IV diltiazem. Cardiac enzymes remained normal. He was placed on Xarelto  Echocardiogram demonstrated worsening LV function with an EF of 35-40% and inferior and inferolateral hypokinesis. Patient was set up for a nuclear study as an inpatient which demonstrated mid to apical anteroseptal perfusion defect suggestive of possible infarct but no ischemia, EF 48%. This was felt to be a low risk study. Decision was made to treat him medically. F/u echo 12/2014 showed EF 50-55%, hypokinesis of the basal mid inferior myocardium, grade 1 DD, mild MR, mild LAE. ACEI had previously been discontinued due to CKD and worsening renal function once reintroduced. Last labs 08/2017 normal TSH, Hgb 13.2, plt 123 (intermittently low in the past too), Cr 1.6, K 4.7, 06/2017 LDL 58 (PCP). [facility] does indicate repeat labs 01/2018 showed Cr 1.9, Hgb 13.5, no platelet level resulted.  02/09/2019 -this is 1 year follow-up, the patient has been doing well, he has been experiencing some dizziness when he gets up also mild lower extremity edema with tightness in his shoes.  He denies any dyspnea on exertion, chest pain, orthopnea or proximal nocturnal dyspnea.  He has been compliant with his medications and  denies any bleeding.  No falls.  Past Medical History:  Diagnosis Date  . Chronic combined systolic and diastolic CHF (congestive heart failure) ([place])   . CKD (chronic kidney disease), stage III ([place])   . Coronary artery disease    a. Nonobstructive by cath 2004. b. Nuc 09/2013 - no ischemia, fixed defect suggestive of possible prior infarction, felt low risk, EF 48%.   . Diabetes mellitus   . GERD (gastroesophageal reflux disease)   . Hyperlipidemia   . Hypertension   . NICM (nonischemic cardiomyopathy) ([place])    a. EF 35-40% in 2009, 55% in 2010. b. 09/2013: 35-40% by echo and 48% by nuc. c. EF 50-55% by echo 12/2014.  [name] PAF (paroxysmal atrial fibrillation) ([place])    a. 09/2013: RVR then bradycardia then spont conv to NSR.  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 Premature ventricular contractions     Past Surgical History:  Procedure Laterality Date  . CARDIAC CATHETERIZATION  10/25/00, 12/29/02  . CHOLECYSTECTOMY N/A 01/25/2015   Procedure: LAPAROSCOPIC CHOLECYSTECTOMY ;  Surgeon: [name], MD;  Location: [facility];  Service: General;  Laterality: N/A;  . SHOULDER SURGERY Left 2011   Shoulder surgery, rod inserted    Current Medications: Current Meds  Medication Sig  . acetaminophen (TYLENOL) 325 MG tablet Take 2 tablets (650 mg total) by mouth every 6 (six) hours as needed for mild pain (or Fever >/= 101).  [name] amoxicillin (AMOXIL) 500 MG capsule Take 1 capsule (500  mg total) by mouth 3 (three) times daily.  [name] atorvastatin (LIPITOR) 40 MG tablet Take 1 tablet (40 mg total) by mouth daily at 6 PM.  . BD PEN NEEDLE NANO U/F 32G X 4 MM MISC USE 1 NEEDLE WITH INSULIN INJECTION ONCE DAILY  . CARTIA XT 120 MG 24 hr capsule TAKE 1 CAPSULE(120 MG) BY MOUTH DAILY  . citalopram (CELEXA) 40 MG tablet Take 40 mg by mouth daily.  . clonazePAM (KLONOPIN) 0.5 MG tablet Take 0.5 mg by mouth daily as needed.   . Coenzyme Q10 (COQ10) 200 MG CAPS Take 100 mg by mouth daily.   . finasteride  (PROSCAR) 5 MG tablet Take 5 mg by mouth once daily  . furosemide (LASIX) 40 MG tablet Take 1 tablet (40 mg total) by mouth daily. Please schedule appt for future refills. 1st attempt  . metoprolol tartrate (LOPRESSOR) 50 MG tablet TAKE 1 AND 1/2 TABLETS(75 MG) BY MOUTH TWICE DAILY  . omeprazole (PRILOSEC) 20 MG capsule Take 20 mg by mouth daily.  . Rivaroxaban (XARELTO) 15 MG TABS tablet TAKE 1 TABLET(15 MG) BY MOUTH DAILY WITH SUPPER  . tamsulosin (FLOMAX) 0.4 MG CAPS capsule Take 0.4 mg by mouth at bedtime.  [name] SOLOSTAR 300 UNIT/ML SOPN Inject 36 Units as directed daily.       Allergies:   Claritin [loratadine] and Tradjenta [linagliptin]   Social History   Socioeconomic History  . Marital status: Married    Spouse name: Not on file  . Number of children: Not on file  . Years of education: Not on file  . Highest education level: Not on file  Occupational History  . Occupation: retired    Comment: from [employer]  Social Needs  . Financial resource strain: Not on file  . Food insecurity    Worry: Not on file    Inability: Not on file  . Transportation needs    Medical: Not on file    Non-medical: Not on file  Tobacco Use  . Smoking status: Former Smoker    Packs/day: 0.50    Years: 10.00    Pack years: 5.00    Types: Cigarettes    Quit date: 01/21/1985    Years since quitting: 34.0  . Smokeless tobacco: Never Used  . Tobacco comment: quit 35 yrs ago  Substance and Sexual Activity  . Alcohol use: No  . Drug use: No  . Sexual activity: Not Currently    Birth control/protection: None  Lifestyle  . Physical activity    Days per week: Not on file    Minutes per session: Not on file  . Stress: Not on file  Relationships  . Social Herbalist on phone: Not on file    Gets together: Not on file    Attends religious service: Not on file    Active member of club or organization: Not on file    Attends meetings of clubs or organizations: Not on file     Relationship status: Not on file  Other Topics Concern  . Not on file  Social History Narrative   Lives with wife in a one story home.  Has 3 children.  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     Education: high school.     Family History:  The patient's family history includes Diabetes in his brother and mother; Heart disease in his brother, father, and mother; Hypertension in his brother and mother. There is no history of Heart  attack or Stroke.  ROS:   Please see the history of present illness. Otherwise, review of systems is positive for hard of hearing.  All other systems are reviewed and otherwise negative.    PHYSICAL EXAM:   VS:  BP 130/66   Pulse (!) 57   Ht 5\' 10"  (1.778 m)   Wt 200 lb 1.9 oz (90.8 kg)   SpO2 96%   BMI 28.71 kg/m   BMI: Body mass index is 28.71 kg/m. GEN: Well nourished, well developed WM in no acute distress HEENT: normocephalic, atraumatic Neck: no JVD, carotid bruits, or masses Cardiac: RRR; no murmurs, rubs, or gallops, no edema  Respiratory:  clear to auscultation bilaterally, normal work of breathing GI: soft, nontender, nondistended, + BS MS: no deformity or atrophy Skin: warm and dry, no rash Neuro:  Alert and Oriented x 3, Strength and sensation are intact, follows commands, hard of hearing Psych: euthymic mood, full affect  Wt Readings from Last 3 Encounters:  02/09/19 200 lb 1.9 oz (90.8 kg)  04/01/18 209 lb (94.8 kg)  03/18/18 216 lb (98 kg)      Studies/Labs Reviewed:   EKG:  EKG was ordered today and personally reviewed by me and demonstrates NSR 60bpm, nonspecific ST-T Changes. QTc listed as prolonged, but personally measured in lead V2 - QTc 447ms. Unusual P wave appearance with low voltage, but similar to prior.  Recent Labs: 04/01/2018: BUN 18; Creatinine, Ser 1.71; Hemoglobin 13.1; Platelets 141; Potassium 4.0; Sodium 142   Lipid Panel No results found for: CHOL, TRIG, HDL, CHOLHDL, VLDL, LDLCALC, LDLDIRECT   Additional studies/ records that were reviewed today include: Summarized above.  ASSESSMENT & PLAN:   1. Paroxysmal atrial fibrillation - maintaining NSR on present regimen.  He is fairly bradycardic with dizziness, I will discontinue his Cardizem. 2. NICM/Chronic combined CHF -he has mild lower extremity edema, but admits to eating high sodium diet, he is advised to cut down use compression stockings elevate his legs, I am going to obtain his BMP and BNP today, if elevated I will consider increasing Lasix. 3. CKD stage III -in the past 2.6, improved to 1.7 in November 2019 very going to recheck today. 4. Essential HTN -rather low at home, I am going to discontinue Cardizem.  Disposition: F/u with Dr. [name] in 6 months.  Medication Adjustments/Labs and Tests Ordered: Current medicines are reviewed at length with the patient today.  Concerns regarding medicines are outlined above. Medication changes, Labs and Tests ordered today are summarized above and listed in the Patient Instructions accessible in Encounters.   Signed, Ena Dawley, MD  02/09/2019 3:06 PM    [facility] [place], [place], [place]  [address] Phone: [phone]; Fax: [phone]

## 2019-02-09 ENCOUNTER — Ambulatory Visit (INDEPENDENT_AMBULATORY_CARE_PROVIDER_SITE_OTHER): Payer: Medicare Other | Admitting: Cardiology

## 2019-02-09 ENCOUNTER — Other Ambulatory Visit: Payer: Self-pay

## 2019-02-09 ENCOUNTER — Encounter: Payer: Self-pay | Admitting: Cardiology

## 2019-02-09 VITALS — BP 130/66 | HR 57 | Ht 70.0 in | Wt 200.1 lb

## 2019-02-09 DIAGNOSIS — I2510 Atherosclerotic heart disease of native coronary artery without angina pectoris: Secondary | ICD-10-CM | POA: Diagnosis not present

## 2019-02-09 DIAGNOSIS — I5042 Chronic combined systolic (congestive) and diastolic (congestive) heart failure: Secondary | ICD-10-CM

## 2019-02-09 DIAGNOSIS — D6859 Other primary thrombophilia: Secondary | ICD-10-CM | POA: Insufficient documentation

## 2019-02-09 DIAGNOSIS — I480 Paroxysmal atrial fibrillation: Secondary | ICD-10-CM

## 2019-02-09 DIAGNOSIS — I493 Ventricular premature depolarization: Secondary | ICD-10-CM

## 2019-02-09 DIAGNOSIS — I2583 Coronary atherosclerosis due to lipid rich plaque: Secondary | ICD-10-CM

## 2019-02-09 DIAGNOSIS — E782 Mixed hyperlipidemia: Secondary | ICD-10-CM

## 2019-02-09 DIAGNOSIS — N183 Chronic kidney disease, stage 3 unspecified: Secondary | ICD-10-CM

## 2019-02-09 DIAGNOSIS — Z79899 Other long term (current) drug therapy: Secondary | ICD-10-CM

## 2019-02-09 DIAGNOSIS — I5021 Acute systolic (congestive) heart failure: Secondary | ICD-10-CM

## 2019-02-09 DIAGNOSIS — N184 Chronic kidney disease, stage 4 (severe): Secondary | ICD-10-CM | POA: Diagnosis not present

## 2019-02-09 DIAGNOSIS — I10 Essential (primary) hypertension: Secondary | ICD-10-CM | POA: Diagnosis not present

## 2019-02-09 DIAGNOSIS — I428 Other cardiomyopathies: Secondary | ICD-10-CM

## 2019-02-09 NOTE — Patient Instructions (Addendum)
Medication Instructions:  Your physician has recommended you make the following change in your medication: Stop Diltiazem   If you need a refill on your cardiac medications before your next appointment, please call your pharmacy.   Lab work: TODAY..... CBC, CMET, INR, TSH, BNP If you have labs (blood work) drawn today and your tests are completely normal, you will receive your results only by: [name] MyChart Message (if you have MyChart) OR . A paper copy in the mail If you have any lab test that is abnormal or we need to change your treatment, we will call you to review the results.  Testing/Procedures: NONE  Follow-Up: At [facility], you and your health needs are our priority.  As part of our continuing mission to provide you with exceptional heart care, we have created designated Provider Care Teams.  These Care Teams include your primary Cardiologist (physician) and Advanced Practice Providers (APPs -  Physician Assistants and Nurse Practitioners) who all work together to provide you with the care you need, when you need it. You will need a follow up appointment in 6 months.  Please call our office 2 months in advance to schedule this appointment.  You may see Ena Dawley, MD or one of the following Advanced Practice Providers on your designated Care Team:   [place], PA-C Melina Copa, PA-C . Ermalinda Barrios, PA-C  Any Other Special Instructions Will Be Listed Below (If Applicable).

## 2019-02-11 ENCOUNTER — Other Ambulatory Visit: Payer: Self-pay | Admitting: Cardiology

## 2019-02-11 ENCOUNTER — Telehealth: Payer: Self-pay | Admitting: *Deleted

## 2019-02-11 ENCOUNTER — Other Ambulatory Visit: Payer: Self-pay

## 2019-02-11 ENCOUNTER — Other Ambulatory Visit: Payer: Medicare Other

## 2019-02-11 DIAGNOSIS — I5021 Acute systolic (congestive) heart failure: Secondary | ICD-10-CM

## 2019-02-11 DIAGNOSIS — N184 Chronic kidney disease, stage 4 (severe): Secondary | ICD-10-CM

## 2019-02-11 DIAGNOSIS — N183 Chronic kidney disease, stage 3 unspecified: Secondary | ICD-10-CM

## 2019-02-11 DIAGNOSIS — I480 Paroxysmal atrial fibrillation: Secondary | ICD-10-CM

## 2019-02-11 DIAGNOSIS — Z79899 Other long term (current) drug therapy: Secondary | ICD-10-CM

## 2019-02-11 DIAGNOSIS — I5042 Chronic combined systolic (congestive) and diastolic (congestive) heart failure: Secondary | ICD-10-CM

## 2019-02-11 DIAGNOSIS — I428 Other cardiomyopathies: Secondary | ICD-10-CM

## 2019-02-11 DIAGNOSIS — I2510 Atherosclerotic heart disease of native coronary artery without angina pectoris: Secondary | ICD-10-CM

## 2019-02-11 DIAGNOSIS — I10 Essential (primary) hypertension: Secondary | ICD-10-CM

## 2019-02-11 DIAGNOSIS — R7989 Other specified abnormal findings of blood chemistry: Secondary | ICD-10-CM

## 2019-02-11 DIAGNOSIS — I2583 Coronary atherosclerosis due to lipid rich plaque: Secondary | ICD-10-CM

## 2019-02-11 LAB — CBC
Hematocrit: 37 % — ABNORMAL LOW (ref 37.5–51.0)
Hemoglobin: 12.6 g/dL — ABNORMAL LOW (ref 13.0–17.7)
MCH: 30.5 pg (ref 26.6–33.0)
MCHC: 34.1 g/dL (ref 31.5–35.7)
MCV: 90 fL (ref 79–97)
Platelets: 133 10*3/uL — ABNORMAL LOW (ref 150–450)
RBC: 4.13 x10E6/uL — ABNORMAL LOW (ref 4.14–5.80)
RDW: 13 % (ref 11.6–15.4)
WBC: 6.1 10*3/uL (ref 3.4–10.8)

## 2019-02-11 LAB — COMPREHENSIVE METABOLIC PANEL
ALT: 12 IU/L (ref 0–44)
AST: 18 IU/L (ref 0–40)
Albumin/Globulin Ratio: 2.1 (ref 1.2–2.2)
Albumin: 3.9 g/dL (ref 3.7–4.7)
Alkaline Phosphatase: 78 IU/L (ref 39–117)
BUN/Creatinine Ratio: 13 (ref 10–24)
BUN: 20 mg/dL (ref 8–27)
Bilirubin Total: 0.5 mg/dL (ref 0.0–1.2)
CO2: 22 mmol/L (ref 20–29)
Calcium: 8.4 mg/dL — ABNORMAL LOW (ref 8.6–10.2)
Chloride: 107 mmol/L — ABNORMAL HIGH (ref 96–106)
Creatinine, Ser: 1.55 mg/dL — ABNORMAL HIGH (ref 0.76–1.27)
GFR calc Af Amer: 48 mL/min/{1.73_m2} — ABNORMAL LOW (ref >59)
GFR calc non Af Amer: 42 mL/min/{1.73_m2} — ABNORMAL LOW (ref >59)
Globulin, Total: 1.9 g/dL (ref 1.5–4.5)
Glucose: 155 mg/dL — ABNORMAL HIGH (ref 65–99)
Potassium: 4.9 mmol/L (ref 3.5–5.2)
Sodium: 142 mmol/L (ref 134–144)
Total Protein: 5.8 g/dL — ABNORMAL LOW (ref 6.0–8.5)

## 2019-02-11 LAB — PRO B NATRIURETIC PEPTIDE: NT-Pro BNP: 3277 pg/mL — ABNORMAL HIGH (ref 0–486)

## 2019-02-11 LAB — TSH: TSH: 2.28 u[IU]/mL (ref 0.450–4.500)

## 2019-02-11 LAB — PROTIME-INR
INR: 1.2 (ref 0.8–1.2)
Prothrombin Time: 13.2 s — ABNORMAL HIGH (ref 9.1–12.0)

## 2019-02-11 MED ORDER — FUROSEMIDE 40 MG PO TABS: 40.0000 mg | ORAL_TABLET | Freq: Two times a day (BID) | ORAL | 1 refills | Status: DC

## 2019-02-11 NOTE — Telephone Encounter (Signed)
-----   Message from [name], MD sent at 02/10/2019  9:39 AM EDT ----- Stable creatinine and hemoglobin, the other labs are pending.

## 2019-02-11 NOTE — Telephone Encounter (Signed)
Pts PRO-BNP is still pending and our Lab Tech Katrina has called [facility] x 2 and still has no reasoning why this is still pending.  Pt will come in today to have this lab redrawn, as well as his PT/INR that was incorrectly ordered as a point of care INR.  Katrina [facility] will be calling the pt to endorse needing to have these redrawn today.

## 2019-02-11 NOTE — Addendum Note (Signed)
Addended by: Eulis Foster on: 02/11/2019 11:25 AM   Modules accepted: Orders

## 2019-02-11 NOTE — Telephone Encounter (Signed)
-----   Message from [name], MD sent at 02/11/2019  4:58 PM EDT ----- I would increase Lasix to 40 mg p.o. twice daily and recheck his BMP and BNP in 4 weeks.

## 2019-02-11 NOTE — Telephone Encounter (Signed)
Notified the pt of his lab results and recommendations per Dr [name].  Advised the pt to increase his lasix to 40 mg po bid.  Confirmed the pharmacy of choice with the pt.  Scheduled the pt for repeat labs to recheck a bmet and pro-bnp in 4 weeks on 10/16 at 1100.  Pt verbalized understanding and agrees with this plan.

## 2019-02-21 ENCOUNTER — Other Ambulatory Visit: Payer: Self-pay | Admitting: Cardiology

## 2019-02-25 ENCOUNTER — Telehealth: Payer: Self-pay | Admitting: Cardiology

## 2019-02-25 NOTE — Telephone Encounter (Signed)
° ° °  Pt c/o medication issue:  1. Name of Medication: furosemide (LASIX) 40 MG tablet  2. How are you currently taking this medication (dosage and times per day)? n/a  3. Are you having a reaction (difficulty breathing--STAT)? no  4. What is your medication issue? Patient stopped taking Lasix 1 week ago at the request of his nephrologist due to kidney concerns

## 2019-02-25 NOTE — Telephone Encounter (Addendum)
Pt is calling to let Dr. [name] know that his Kidney Doctor at [facility], Dr. Ambrose Pancoast,  saw him a week ago, did labs, and noted his creatinine was elevated, and took him off lasix all together.  Pt states he saw the  Physician Assistant there, and he advised the pt that his kidney function was elevated and took him off of his prescribed increased dose of lasix 40 mg po bid.  Pt states that the PA there advised him that if he noticed any lower extremity edema or increased sob, that he could take a reduced dose of lasix 20 mg po bid PRN.  Pt states that the PA at [facility] was to call/fax the pts labs and progress note to our office last week, so that Dr. [name] would be aware of this, and could further advise.  Pt was recently started by Dr. [name] on increased dose of lasix 40 mg po bid, for noted elevated BNP on his labs done in our office on 9/17.  Pt then turned around and saw his Kidney Doctor on 9/21 and that's where they checked his renal function and took his lasix away due to elevated creatinine.  Pt states that the PA there did say that his heart failure seemed to be improved at his 9/21 visit, for he had no lower extremity edema or increased sob, and pt reported he had loss a significant amount of weight (unable to report total amount loss), due to  increased lasix dose on 9/17, made by Dr. [name]. Pt states currently he in not taking any lasix at all, and he has no swelling and no increased sob.  Pt states he is avoiding all salt in his diet. Pt also states he is weighing himself daily. Pt is inquiring if Dr. Elissa Hefty Physician Assistant ever sent over the progress notes and lab results to Dr. [name] to further review and advise on. Informed the pt that we have no labs or progress note sent from their office and will need this information to further provide safe recommendations on medication changes.  Informed the pt that I will call Dr. Elissa Hefty office now and request most  recent labs and progress note from 9/21, where his lasix was discontinued. Advised the pt that until we get those results and can further advise, he should continue with their plan and take lasix 20 mg po bid PRN for increased sob or lower extremity edema.  Also advised the pt that he does have a lab appt with our office on 10/16, to recheck BMET/PRO-BNP, from his previous result with [place] on 9/17.  Advised him to keep this appt.  Advised the pt to continue with his low sodium diet, and continue monitoring his dry weight every morning.  Advised him if his weight increases 3 lbs in a 24 hour time period and 5 lbs in a week, then he should call our office immediately to make [place] aware of this.  Informed the pt that I will route this message to Dr. [name] to make her aware of this issue, and aware that I have requested his recent labs and progress note from his Kidney Doctors office.  Informed the pt that once further recommendations are provided, I will follow-up with him shortly thereafter.  Pt verbalized understanding and agrees with this plan.

## 2019-03-02 ENCOUNTER — Telehealth: Payer: Self-pay | Admitting: Cardiology

## 2019-03-02 MED ORDER — FUROSEMIDE 20 MG PO TABS: 20.0000 mg | ORAL_TABLET | Freq: Two times a day (BID) | ORAL | 3 refills | Status: DC | PRN

## 2019-03-02 NOTE — Telephone Encounter (Signed)
Encounter not needed

## 2019-03-02 NOTE — Addendum Note (Signed)
Addended by: Nuala Alpha on: 03/02/2019 04:40 PM   Modules accepted: Orders

## 2019-03-02 NOTE — Telephone Encounter (Signed)
I agree with this plan recommended by kidney specialist.  Please have him call us if his weight goes up by 5 pounds.

## 2019-03-02 NOTE — Telephone Encounter (Addendum)
Endorsed to the pt that per Dr [name], she agrees with his Urologist plan for him to continue taking lasix 20 mg po bid as needed for lower extremity swelling, increased sob, or weight gain of 5 lbs in one week. Advised the pt that per Dr [name] if he has weight gain of 5 lbs in a week, then he should call us and let us know this.  Advised the pt to keep his lab appt with [place], as planned for 10/16. Pt verbalized understanding and agrees with this plan.

## 2019-03-02 NOTE — Telephone Encounter (Signed)
Left the pt a message to call the office back to discuss recommendations per Dr [name], from conversation on 10/1.

## 2019-03-02 NOTE — Telephone Encounter (Signed)
Dose change reflected in the pts med list to say lasix 20 mg po bid as needed for LEE, SOB, FLUID, OR INCREASED WEIGHT.

## 2019-03-05 ENCOUNTER — Other Ambulatory Visit: Payer: Self-pay | Admitting: Cardiology

## 2019-03-05 DIAGNOSIS — I480 Paroxysmal atrial fibrillation: Secondary | ICD-10-CM

## 2019-03-05 DIAGNOSIS — I10 Essential (primary) hypertension: Secondary | ICD-10-CM

## 2019-03-05 DIAGNOSIS — I5020 Unspecified systolic (congestive) heart failure: Secondary | ICD-10-CM

## 2019-03-12 ENCOUNTER — Other Ambulatory Visit: Payer: Self-pay

## 2019-03-12 ENCOUNTER — Other Ambulatory Visit: Payer: Medicare Other | Admitting: *Deleted

## 2019-03-12 DIAGNOSIS — I5021 Acute systolic (congestive) heart failure: Secondary | ICD-10-CM

## 2019-03-12 DIAGNOSIS — I5042 Chronic combined systolic (congestive) and diastolic (congestive) heart failure: Secondary | ICD-10-CM

## 2019-03-12 DIAGNOSIS — I2583 Coronary atherosclerosis due to lipid rich plaque: Secondary | ICD-10-CM

## 2019-03-12 DIAGNOSIS — R7989 Other specified abnormal findings of blood chemistry: Secondary | ICD-10-CM

## 2019-03-12 DIAGNOSIS — N184 Chronic kidney disease, stage 4 (severe): Secondary | ICD-10-CM

## 2019-03-12 DIAGNOSIS — I2510 Atherosclerotic heart disease of native coronary artery without angina pectoris: Secondary | ICD-10-CM

## 2019-03-12 LAB — BASIC METABOLIC PANEL
BUN/Creatinine Ratio: 14 (ref 10–24)
BUN: 22 mg/dL (ref 8–27)
CO2: 23 mmol/L (ref 20–29)
Calcium: 8.7 mg/dL (ref 8.6–10.2)
Chloride: 106 mmol/L (ref 96–106)
Creatinine, Ser: 1.53 mg/dL — ABNORMAL HIGH (ref 0.76–1.27)
GFR calc Af Amer: 49 mL/min/{1.73_m2} — ABNORMAL LOW (ref >59)
GFR calc non Af Amer: 42 mL/min/{1.73_m2} — ABNORMAL LOW (ref >59)
Glucose: 123 mg/dL — ABNORMAL HIGH (ref 65–99)
Potassium: 4.2 mmol/L (ref 3.5–5.2)
Sodium: 142 mmol/L (ref 134–144)

## 2019-03-12 LAB — PRO B NATRIURETIC PEPTIDE: NT-Pro BNP: 2526 pg/mL — ABNORMAL HIGH (ref 0–486)

## 2019-03-15 ENCOUNTER — Telehealth: Payer: Self-pay | Admitting: *Deleted

## 2019-03-15 DIAGNOSIS — I5042 Chronic combined systolic (congestive) and diastolic (congestive) heart failure: Secondary | ICD-10-CM

## 2019-03-15 DIAGNOSIS — I5021 Acute systolic (congestive) heart failure: Secondary | ICD-10-CM

## 2019-03-15 DIAGNOSIS — N184 Chronic kidney disease, stage 4 (severe): Secondary | ICD-10-CM

## 2019-03-15 DIAGNOSIS — R7989 Other specified abnormal findings of blood chemistry: Secondary | ICD-10-CM

## 2019-03-15 MED ORDER — FUROSEMIDE 40 MG PO TABS: 40.0000 mg | ORAL_TABLET | Freq: Every day | ORAL | 1 refills | Status: DC

## 2019-03-15 NOTE — Telephone Encounter (Signed)
-----   Message from [name], MD sent at 03/15/2019  1:46 PM EDT ----- Please increase lasix to 40 mg po daily (not PRN), please arrange to be seen by a PA in 2 months with repeat BMP and BNP

## 2019-03-15 NOTE — Telephone Encounter (Signed)
Pt notified that per Dr [name], his labs showed increase BNP, so she recommends that we increase his lasix to 40 mg po daily from PRN, have him come back in 2 months to see an APP, with lab same day, to recheck BMET/PRO-BNP.  Confirmed the pharmacy of choice with the pt.  Lab scheduled for 05/11/19 at 0915, same day as he will see Melina Copa PA-C.  Appointment scheduled with Melina Copa PA-C on 05/11/19 at 0930.  Pt verbalized understanding and agrees with this plan.

## 2019-03-31 ENCOUNTER — Ambulatory Visit: Payer: Medicare Other | Admitting: Podiatry

## 2019-03-31 ENCOUNTER — Other Ambulatory Visit: Payer: Self-pay

## 2019-03-31 ENCOUNTER — Encounter: Payer: Self-pay | Admitting: Podiatry

## 2019-03-31 DIAGNOSIS — E1142 Type 2 diabetes mellitus with diabetic polyneuropathy: Secondary | ICD-10-CM | POA: Diagnosis not present

## 2019-03-31 DIAGNOSIS — M216X1 Other acquired deformities of right foot: Secondary | ICD-10-CM | POA: Insufficient documentation

## 2019-03-31 DIAGNOSIS — E1140 Type 2 diabetes mellitus with diabetic neuropathy, unspecified: Secondary | ICD-10-CM | POA: Insufficient documentation

## 2019-03-31 DIAGNOSIS — Q828 Other specified congenital malformations of skin: Secondary | ICD-10-CM | POA: Diagnosis not present

## 2019-03-31 DIAGNOSIS — M216X2 Other acquired deformities of left foot: Secondary | ICD-10-CM | POA: Insufficient documentation

## 2019-03-31 NOTE — Progress Notes (Addendum)
This patient presents to the office with chief complaint of long thick nails and diabetic feet.  This patient  says there  is  no pain and discomfort in their feet.  This patient says there are long thick painful nails.  These nails are painful walking and wearing shoes.  Patient has no history of infection or drainage from both feet.  Patient is unable to  self treat his own nails . Patient says he has a bad callus under the outside ball of his left foot and callus under the outside ball of right foot.  He has been applying vaseline to his callus left foot for 6 months.  He states he has no drainage. This patient presents  to the office today for treatment of the  long nails ,  callus and a foot evaluation due to history of  Diabetes.  Patient is taking xarelto.  General Appearance  Alert, conversant and in no acute stress.  Vascular  Dorsalis pedis and posterior tibial  pulses are weakly  palpable  bilaterally.  Capillary return is within normal limits  bilaterally. Temperature is within normal limits  bilaterally.  Neurologic  Senn-Weinstein monofilament wire test diminished/absent   bilaterally. Muscle power within normal limits bilaterally.  Nails Thick disfigured discolored nails with subungual debris  from hallux to fifth toes bilaterally. No evidence of bacterial infection or drainage bilaterally.  Orthopedic  No limitations of motion of motion feet .  No crepitus or effusions noted.  No bony pathology or digital deformities noted.  Plantar flexed fifth metatarsal  B/L.    Skin  normotropic skin  noted bilaterally.  No signs of infections or ulcers noted.   Callus sub 5th met right foot.  Blackened necrotic tissue noted developing under fifth metatarsal head left foot.    Onychomycosis  Diabetes with neuropathy.  Callus right foot.  S/P ulceration/callus sub 5th metatarsal left foot.  IE  Debride nails x 10.  Debride necrotic tissue left foot.    A diabetic foot exam was performed and there  is  evidence of vascular or neurologic pathology.  Discussed condition with patient.  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  Patient will be seen by [facility] today.   RTC 3 months for preventative foot care services.   Gardiner Barefoot DPM

## 2019-04-05 ENCOUNTER — Other Ambulatory Visit: Payer: Self-pay | Admitting: Cardiology

## 2019-04-30 ENCOUNTER — Other Ambulatory Visit: Payer: Self-pay

## 2019-04-30 ENCOUNTER — Ambulatory Visit: Payer: Medicare Other | Admitting: Orthotics

## 2019-04-30 DIAGNOSIS — M216X2 Other acquired deformities of left foot: Secondary | ICD-10-CM | POA: Diagnosis not present

## 2019-04-30 DIAGNOSIS — E11621 Type 2 diabetes mellitus with foot ulcer: Secondary | ICD-10-CM

## 2019-04-30 DIAGNOSIS — L84 Corns and callosities: Secondary | ICD-10-CM

## 2019-04-30 DIAGNOSIS — M216X1 Other acquired deformities of right foot: Secondary | ICD-10-CM | POA: Diagnosis not present

## 2019-05-10 NOTE — Progress Notes (Deleted)
Cardiology Office Note    Date:  05/10/2019   ID:  Oswald, Pott 1940/07/12, MRN [id number]  PCP:  [name], MD  Cardiologist:  Ena Dawley, MD  Electrophysiologist:  None   Chief Complaint: f/u CHF  History of Present Illness:   Erik [name] is a 78 y.o. male with history of no significant CAD on cath in 2004, PVCs, PAF, NICM (variable EF over the years), chronic combined CHF, CKD stage III, DM, HTN, HL (followed by PCP), mild thrombocytopenia on labs, trigeminal neuralgia who presents for 6 month follow-up.   He underwent cath in 2004 for EF 30-35% with minimal irregularities in the LAD otherwise no significant disease overall. EF has fluctuated since that time. It was 35-40% in 2009, 55% in 2010, then back down to 35-40% in 2015 during admission for AF-RVR. At that time in 2015, he spontaneously converted to NSR on diltiazem. Cardiac enzymes remained normal and he was placed on Xarelto. Echocardiogram demonstrated worsening LV function with an EF of 35-40% and inferior and inferolateral hypokinesis. He underwent nuclear stress test which demonstrated mid to apical anteroseptal perfusion defect suggestive of possible infarct but no ischemia, EF 48%. This was felt to be a low risk study and medical therapy was recommended. Last echocardiogram in 12/2014 showed EF 50-55%, hypokinesis of the basal mid inferior myocardium, grade 1 DD, mild MR, mild LAE. ACEI had previously been discontinued due to CKD and worsening renal function once reintroduced. Of note, he also trigeminal neuralgia - carbamazepime was remotely discontinued due to interaction with Xarelto. He recently saw Dr. [name] 01/2019 and reported some orthostatic-type dizziness as well as tightness in his shoes. He was felt to be bradycardic so cardizem was discontined (pulse 57). He also had mild lower extremity edema. Labs revealed BNP 3277, Hgb 12.6 with plt 133, normal TSH, K 4.9, Cr 1.55 -> Lasix was increased to  30m BID. However, phone note 10/1 indicate he saw his nephrologist with worsening renal function and Lasix was changed to 274mBID PRN only. F/u labs 03/12/19 showed BNP 2526, K 4.2, Cr 1.53 so Lasix was resumed daily instead of PRN.  f/u cbc, bmet, bnp chf education 2d echo i met in 2016 xarelto dosing  Acute on chronic combined CHF Paroxysmal atrial fibrillation CKD stage III Essential HTN Mild anemia/thrombocytopenia  Past Medical History:  Diagnosis Date  . Chronic combined systolic and diastolic CHF (congestive heart failure) ([facility]  . CKD (chronic kidney disease), stage III   . Coronary artery disease    a. Nonobstructive by cath 2004. b. Nuc 09/2013 - no ischemia, fixed defect suggestive of possible prior infarction, felt low risk, EF 48%.   . Diabetes mellitus   . GERD (gastroesophageal reflux disease)   . Hyperlipidemia   . Hypertension   . N[place] ([place]mic cardiomyopathy) ([place]   a. EF 35-40% in 2009, 55% in 2010. b. 09/2013: 35-40% by echo and 48% by nuc. c. EF 50-55% by echo 12/2014.  . Marland KitchenAF (paroxysmal atrial fibrillation) ([facility]   a. 09/2013: RVR then bradycardia then spont conv to NSR.  . 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 Premature ventricular contractions     Past Surgical History:  Procedure Laterality Date  . CARDIAC CATHETERIZATION  10/25/00, 12/29/02  . CHOLECYSTECTOMY N/A 01/25/2015   Procedure: LAPAROSCOPIC CHOLECYSTECTOMY ;  Surgeon: DoCoralie KeensMD;  [facility]: General;  Laterality: N/A;  . SHOULDER SURGERY Left 2011  Shoulder surgery, rod inserted    Current Medications: No outpatient medications have been marked as taking for the 05/11/19 encounter (Appointment) with Charlie Pitter, PA-C.   ***   Allergies:   Claritin [loratadine] and Tradjenta [linagliptin]   Social History   Socioeconomic History  . Marital status: Married    Spouse name: Not on file  . Number of children: Not on file  . Years of education: Not  on file  . Highest education level: Not on file  Occupational History  . Occupation: retired    Comment: from maintenance  Tobacco Use  . Smoking status: Former Smoker    Packs/day: 0.50    Years: 10.00    Pack years: 5.00    Types: Cigarettes    Quit date: 01/21/1985    Years since quitting: 34.3  . Smokeless tobacco: Never Used  . Tobacco comment: quit 35 yrs ago  Substance and Sexual Activity  . Alcohol use: No  . Drug use: No  . Sexual activity: Not Currently    Birth control/protection: None  Other Topics Concern  . Not on file  Social History Narrative   Lives with wife in a one story home.  Has 3 children.  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     Education: high school.   Social Determinants of Health   Financial Resource Strain:   . Difficulty of Paying Living Expenses: Not on file  Food Insecurity:   . Worried About Charity fundraiser in the Last Year: Not on file  . Ran Out of Food in the Last Year: Not on file  Transportation Needs:   . Lack of Transportation (Medical): Not on file  . Lack of Transportation (Non-Medical): Not on file  Physical Activity:   . Days of Exercise per Week: Not on file  . Minutes of Exercise per Session: Not on file  Stress:   . Feeling of Stress : Not on file  Social Connections:   . Frequency of Communication with Friends and Family: Not on file  . Frequency of Social Gatherings with Friends and Family: Not on file  . Attends Religious Services: Not on file  . Active Member of Clubs or Organizations: Not on file  . Attends Archivist Meetings: Not on file  . Marital Status: Not on file     Family History:  The patient's ***family history includes Diabetes in his brother and mother; Heart disease in his brother, father, and mother; Hypertension in his brother and mother. There is no history of Heart attack or Stroke.  ROS:   Please see the history of present illness. Otherwise, review of systems is  positive for ***.  All other systems are reviewed and otherwise negative.    EKGs/Labs/Other Studies Reviewed:    Studies reviewed were summarized above.   EKG:  EKG is ordered today, personally reviewed, demonstrating ***  Recent Labs: 02/09/2019: ALT 12; Hemoglobin 12.6; Platelets 133; TSH 2.280 03/12/2019: BUN 22; Creatinine, Ser 1.53; NT-Pro BNP 2,526; Potassium 4.2; Sodium 142  Recent Lipid Panel No results found for: CHOL, TRIG, HDL, CHOLHDL, VLDL, LDLCALC, LDLDIRECT  PHYSICAL EXAM:    VS:  There were no vitals taken for this visit.  BMI: There is no height or weight on file to calculate BMI.  GEN: Well nourished, well developed, in no acute distress HEENT: normocephalic, atraumatic Neck: no JVD, carotid bruits, or masses Cardiac: ***RRR; no murmurs, rubs, or gallops, no edema  Respiratory:  clear  to auscultation bilaterally, normal work of breathing GI: soft, nontender, nondistended, + BS MS: no deformity or atrophy Skin: warm and dry, no rash Neuro:  Alert and Oriented x 3, Strength and sensation are intact, follows commands Psych: euthymic mood, full affect  Wt Readings from Last 3 Encounters:  02/09/19 200 lb 1.9 oz (90.8 kg)  04/01/18 209 lb (94.8 kg)  03/18/18 216 lb (98 kg)     ASSESSMENT & PLAN:   1. ***  Disposition: F/u with ***   Medication Adjustments/Labs and Tests Ordered: Current medicines are reviewed at length with the patient today.  Concerns regarding medicines are outlined above. Medication changes, Labs and Tests ordered today are summarized above and listed in the Patient Instructions accessible in Encounters.   Signed, Charlie Pitter, PA-C  05/10/2019 10:05 AM    [address], [place], [place]  [address] Phone: [phone]; Fax: [phone]

## 2019-05-11 ENCOUNTER — Ambulatory Visit: Payer: Medicare Other | Admitting: Physician Assistant

## 2019-05-11 ENCOUNTER — Other Ambulatory Visit: Payer: Self-pay

## 2019-05-11 ENCOUNTER — Other Ambulatory Visit: Payer: Medicare Other

## 2019-05-11 DIAGNOSIS — I5021 Acute systolic (congestive) heart failure: Secondary | ICD-10-CM

## 2019-05-11 DIAGNOSIS — I10 Essential (primary) hypertension: Secondary | ICD-10-CM

## 2019-05-11 DIAGNOSIS — I5043 Acute on chronic combined systolic (congestive) and diastolic (congestive) heart failure: Secondary | ICD-10-CM

## 2019-05-11 DIAGNOSIS — N184 Chronic kidney disease, stage 4 (severe): Secondary | ICD-10-CM

## 2019-05-11 DIAGNOSIS — R7989 Other specified abnormal findings of blood chemistry: Secondary | ICD-10-CM

## 2019-05-11 DIAGNOSIS — I5042 Chronic combined systolic (congestive) and diastolic (congestive) heart failure: Secondary | ICD-10-CM

## 2019-05-11 DIAGNOSIS — I480 Paroxysmal atrial fibrillation: Secondary | ICD-10-CM

## 2019-05-11 DIAGNOSIS — D649 Anemia, unspecified: Secondary | ICD-10-CM

## 2019-05-12 LAB — PRO B NATRIURETIC PEPTIDE: NT-Pro BNP: 1964 pg/mL — ABNORMAL HIGH (ref 0–486)

## 2019-05-12 LAB — BASIC METABOLIC PANEL
BUN/Creatinine Ratio: 10 (ref 10–24)
BUN: 16 mg/dL (ref 8–27)
CO2: 25 mmol/L (ref 20–29)
Calcium: 8.3 mg/dL — ABNORMAL LOW (ref 8.6–10.2)
Chloride: 105 mmol/L (ref 96–106)
Creatinine, Ser: 1.53 mg/dL — ABNORMAL HIGH (ref 0.76–1.27)
GFR calc Af Amer: 49 mL/min/{1.73_m2} — ABNORMAL LOW (ref >59)
GFR calc non Af Amer: 42 mL/min/{1.73_m2} — ABNORMAL LOW (ref >59)
Glucose: 161 mg/dL — ABNORMAL HIGH (ref 65–99)
Potassium: 4.2 mmol/L (ref 3.5–5.2)
Sodium: 143 mmol/L (ref 134–144)

## 2019-05-13 ENCOUNTER — Ambulatory Visit: Payer: Medicare Other | Admitting: Cardiology

## 2019-05-13 ENCOUNTER — Other Ambulatory Visit: Payer: Self-pay

## 2019-05-13 ENCOUNTER — Encounter: Payer: Self-pay | Admitting: Cardiology

## 2019-05-13 VITALS — BP 130/68 | HR 66 | Ht 70.0 in | Wt 204.4 lb

## 2019-05-13 DIAGNOSIS — I10 Essential (primary) hypertension: Secondary | ICD-10-CM

## 2019-05-13 DIAGNOSIS — N183 Chronic kidney disease, stage 3 unspecified: Secondary | ICD-10-CM | POA: Diagnosis not present

## 2019-05-13 DIAGNOSIS — I5043 Acute on chronic combined systolic (congestive) and diastolic (congestive) heart failure: Secondary | ICD-10-CM | POA: Diagnosis not present

## 2019-05-13 DIAGNOSIS — I480 Paroxysmal atrial fibrillation: Secondary | ICD-10-CM | POA: Diagnosis not present

## 2019-05-13 NOTE — Patient Instructions (Signed)
Medication Instructions:  Your physician recommends that you continue on your current medications as directed. Please refer to the Current Medication list given to you today.  *If you need a refill on your cardiac medications before your next appointment, please call your pharmacy*  Lab Work: None ordered   If you have labs (blood work) drawn today and your tests are completely normal, you will receive your results only by: [name] MyChart Message (if you have MyChart) OR . A paper copy in the mail If you have any lab test that is abnormal or we need to change your treatment, we will call you to review the results.  Testing/Procedures: None ordered   Follow-Up: At [facility], you and your health needs are our priority.  As part of our continuing mission to provide you with exceptional heart care, we have created designated Provider Care Teams.  These Care Teams include your primary Cardiologist (physician) and Advanced Practice Providers (APPs -  Physician Assistants and Nurse Practitioners) who all work together to provide you with the care you need, when you need it.  Your next appointment:   3 month(s)  The format for your next appointment:   In Person  Provider:   You may see Ena Dawley, MD or one of the following Advanced Practice Providers on your designated Care Team:    Melina Copa, PA-C  Ermalinda Barrios, PA-C   Other Instructions None

## 2019-05-13 NOTE — Progress Notes (Signed)
Cardiology Office Note:    Date:  05/13/2019   ID:  Erik, [name] 1940-08-12, MRN [id number]  PCP:  Erik Pao, MD  Cardiologist:  Ena Dawley, MD  Referring MD: Erik Pao, MD   Chief Complaint  Patient presents with  . Congestive Heart Failure    History of Present Illness:    Erik [name] is a 78 y.o. male with a past medical history significant for no significant CAD on cath in 2004, PVCs, PAF, NICM (variable EF over the years), chronic combined CHF, CKD stage III, DM, HTN, HL (followed by PCP).   The patient underwent cardiac catheterization 2004 for EF 30-35% with finding of minimal irregularities in the LAD otherwise no significant disease.  EF was 35-40% in 2009, 55% in 2010, then back down to 35-40% in 2015 during admission for A. fib with RVR.  At that time he spontaneously converted to normal sinus rhythm on IV diltiazem.  Cardiac enzymes remained normal.  He was placed on Xarelto.  Echocardiogram demonstrated worsening LV function with EF 35-40% and inferior and inferolateral hypokinesis.  Patient was set up for nuclear study as an inpatient which demonstrated mid to apical anteroseptal perfusion defect suggestive of possible infarct but no ischemia, EF 48%.  This was felt to be a low risk study.  Decision was made to treat him medically.  [place] up echo 12/2014 showed EF 50-55%, hypokinesis of the basal mid inferior myocardium, grade 1 diastolic dysfunction, mild MR, mild LAE.  ACE inhibitor had previously been discontinued due to CKD and worsening renal function once reintroduced.  He was last seen in the office on 02/09/2019 for 1 year follow-up at which time he had been experiencing some dizziness and mild lower extremity edema.  He was maintaining normal sinus rhythm but fairly bradycardic with some dizziness so Cardizem was discontinued.  The patient had admitted to eating high sodium foods.  BNP was checked and was elevated so he was switched from as  needed Lasix to daily dosing.  The patient is here for follow-up. He is retired and sits around the house mostly since it got cold. He does a lot of yard work in the spring and summer. He lives with his wife and helps out around the house. He denies any chest pressure or shortness of breath with activity. He is not limited. No orthpnea, PND, palpitations, lightheadedness. His LE edema has resolved.   Labs done on 12/15 showed stable renal function and BNP improved. He was told to go back to taking lasix only as needed, usually every 2-3 days. He is concerned about his kidneys and follows with nephrology.    Cardiac studies   Echocardiogram 01/23/2015 Study Conclusions  - Left ventricle: The cavity size was normal. Systolic function was   normal. The estimated ejection fraction was in the range of 50%   to 55%. There is hypokinesis of the basal-midinferior myocardium.   Doppler parameters are consistent with abnormal left ventricular   relaxation (grade 1 diastolic dysfunction). - Ventricular septum: Septal motion showed paradox. - Mitral valve: Mildly calcified annulus. There was mild   regurgitation. - Left atrium: The atrium was mildly dilated.  Impressions: - Compared to the prior study, there has been no significant   interval change.   Past Medical History:  Diagnosis Date  . Chronic combined systolic and diastolic CHF (congestive heart failure) ([place])   . CKD (chronic kidney disease), stage III   . Coronary artery disease  a. Nonobstructive by cath 2004. b. Nuc 09/2013 - no ischemia, fixed defect suggestive of possible prior infarction, felt low risk, EF 48%.   . Diabetes mellitus   . GERD (gastroesophageal reflux disease)   . Hyperlipidemia   . Hypertension   . NICM (nonischemic cardiomyopathy) ([place])    a. EF 35-40% in 2009, 55% in 2010. b. 09/2013: 35-40% by echo and 48% by nuc. c. EF 50-55% by echo 12/2014.  [name] PAF (paroxysmal atrial fibrillation) ([place])    a. 09/2013:  RVR then bradycardia then spont conv to NSR.  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 Premature ventricular contractions     Past Surgical History:  Procedure Laterality Date  . CARDIAC CATHETERIZATION  10/25/00, 12/29/02  . CHOLECYSTECTOMY N/A 01/25/2015   Procedure: LAPAROSCOPIC CHOLECYSTECTOMY ;  Surgeon: [name], MD;  Location: [facility];  Service: General;  Laterality: N/A;  . SHOULDER SURGERY Left 2011   Shoulder surgery, rod inserted    Current Medications: Current Meds  Medication Sig  . acetaminophen (TYLENOL) 325 MG tablet Take 2 tablets (650 mg total) by mouth every 6 (six) hours as needed for mild pain (or Fever >/= 101).  [name] atorvastatin (LIPITOR) 40 MG tablet Take 1 tablet (40 mg total) by mouth daily at 6 PM.  . BD PEN NEEDLE NANO U/F 32G X 4 MM MISC USE 1 NEEDLE WITH INSULIN INJECTION ONCE DAILY  . citalopram (CELEXA) 40 MG tablet Take 40 mg by mouth daily.  . clonazePAM (KLONOPIN) 0.5 MG tablet Take 0.5 mg by mouth daily as needed.   . Coenzyme Q10 (COQ10) 200 MG CAPS Take 100 mg by mouth daily.   . finasteride (PROSCAR) 5 MG tablet Take 5 mg by mouth once daily  . furosemide (LASIX) 40 MG tablet Take 40 mg by mouth as needed.  . metoprolol tartrate (LOPRESSOR) 50 MG tablet TAKE 1 AND 1/2 TABLETS(75 MG) BY MOUTH TWICE DAILY  . omeprazole (PRILOSEC) 20 MG capsule Take 20 mg by mouth daily.  . Rivaroxaban (XARELTO) 15 MG TABS tablet TAKE 1 TABLET(15 MG) BY MOUTH DAILY WITH SUPPER  . tamsulosin (FLOMAX) 0.4 MG CAPS capsule Take 0.4 mg by mouth at bedtime.  [name] SOLOSTAR 300 UNIT/ML SOPN Inject 36 Units as directed daily.   . [DISCONTINUED] furosemide (LASIX) 40 MG tablet Take 1 tablet (40 mg total) by mouth daily.     Allergies:   Claritin [loratadine] and Tradjenta [linagliptin]   Social History   Socioeconomic History  . Marital status: Married    Spouse name: Not on file  . Number of children: Not on file  . Years of education: Not on file  .  Highest education level: Not on file  Occupational History  . Occupation: retired    Comment: from maintenance  Tobacco Use  . Smoking status: Former Smoker    Packs/day: 0.50    Years: 10.00    Pack years: 5.00    Types: Cigarettes    Quit date: 01/21/1985    Years since quitting: 34.3  . Smokeless tobacco: Never Used  . Tobacco comment: quit 35 yrs ago  Substance and Sexual Activity  . Alcohol use: No  . Drug use: No  . Sexual activity: Not Currently    Birth control/protection: None  Other Topics Concern  . Not on file  Social History Narrative   Lives with wife in a one story home.  Has 3 children.  Retired from Wellsite geologist at [employer]     Education: high school.   Social Determinants of Health   Financial Resource Strain:   . Difficulty of Paying Living Expenses: Not on file  Food Insecurity:   . Worried About Charity fundraiser in the Last Year: Not on file  . Ran Out of Food in the Last Year: Not on file  Transportation Needs:   . Lack of Transportation (Medical): Not on file  . Lack of Transportation (Non-Medical): Not on file  Physical Activity:   . Days of Exercise per Week: Not on file  . Minutes of Exercise per Session: Not on file  Stress:   . Feeling of Stress : Not on file  Social Connections:   . Frequency of Communication with Friends and Family: Not on file  . Frequency of Social Gatherings with Friends and Family: Not on file  . Attends Religious Services: Not on file  . Active Member of Clubs or Organizations: Not on file  . Attends Archivist Meetings: Not on file  . Marital Status: Not on file     Family History: The patient's family history includes Diabetes in his brother and mother; Heart disease in his brother, father, and mother; Hypertension in his brother and mother. There is no history of Heart attack or Stroke. ROS:   Please see the history of present illness.     All other systems reviewed and are  negative.   EKG:  EKG is ordered today.  The ekg ordered today demonstrates sinus rhythm with PAC and PVC, 66 bpm, QTC 488  Recent Labs: 02/09/2019: ALT 12; Hemoglobin 12.6; Platelets 133; TSH 2.280 05/11/2019: BUN 16; Creatinine, Ser 1.53; NT-Pro BNP 1,964; Potassium 4.2; Sodium 143   Recent Lipid Panel No results found for: CHOL, TRIG, HDL, CHOLHDL, VLDL, LDLCALC, LDLDIRECT  Physical Exam:    VS:  BP 130/68   Pulse 66   Ht 5\' 10"  (1.778 m)   Wt 204 lb 6.4 oz (92.7 kg)   SpO2 98%   BMI 29.33 kg/m     Wt Readings from Last 6 Encounters:  05/13/19 204 lb 6.4 oz (92.7 kg)  02/09/19 200 lb 1.9 oz (90.8 kg)  04/01/18 209 lb (94.8 kg)  03/18/18 216 lb (98 kg)  09/29/17 206 lb 9.6 oz (93.7 kg)  06/04/17 211 lb 1.9 oz (95.8 kg)     Physical Exam  Constitutional: He is oriented to person, place, and time. He appears well-developed and well-nourished. No distress.  HENT:  Head: Normocephalic and atraumatic.  Neck: No JVD present.  Cardiovascular: Normal rate, regular rhythm, normal heart sounds and intact distal pulses. Exam reveals no gallop and no friction rub.  No murmur heard. Pulmonary/Chest: Effort normal and breath sounds normal. No respiratory distress. He has no wheezes. He has no rales.  Abdominal: Soft. Bowel sounds are normal.  Musculoskeletal:        General: No edema. Normal range of motion.     Cervical back: Normal range of motion and neck supple.  Neurological: He is alert and oriented to person, place, and time.  Skin: Skin is warm and dry.  Psychiatric: He has a normal mood and affect. His behavior is normal. Judgment and thought content normal.  Vitals reviewed.   ASSESSMENT:    1. Acute on chronic combined systolic and diastolic CHF (congestive heart failure) (HCC)   2. Paroxysmal atrial fibrillation (HCC)   3. Stage 3 chronic kidney disease, unspecified whether stage 3a or 3b CKD  4. Essential hypertension    PLAN:    In order of problems listed  above:  NICM/chronic combined heart failure, acute on chronic -Patient was seen in September and felt to be volume overloaded.  Lasix was increased from as needed to 40 mg daily. -proBNP was elevated at 3277 in September.  Recheck 2 days ago was 1964. -Reduce sodium intake, he says that he is doing so. -Pt is now taking lasix as needed about every 2-3 days. His BNP is improved and he appears euvolemic. He reports that he feels at his norm with resolved edema.   Paroxysmal atrial fibrillation -Maintaining sinus rhythm on current regimen.  Cardizem was discontinued in September due to bradycardia with some dizziness.  He continues on metoprolol 75 mg twice daily -On rivaroxaban for stroke risk reduction.  Reduced dose for kidney function with Creat clearance ~50.   CKD stage III -In the past serum creatinine was up to 2.6, improved to 1.7 and 03/2018.  Serum creatinine was 1.55 at last office visit in September.  Recent labs on 05/11/2019 indicated serum creatinine of 1.53. -Follows with nephrology.   Essential hypertension -Cardizem discontinued due to soft blood pressure and bradycardia. -Continues of metoprolol. BP well controlled.   Medication Adjustments/Labs and Tests Ordered: Current medicines are reviewed at length with the patient today.  Concerns regarding medicines are outlined above. Labs and tests ordered and medication changes are outlined in the patient instructions below:  There are no Patient Instructions on file for this visit.   Signed, Daune Perch, NP  05/13/2019 11:15 AM    [place]

## 2019-06-08 DIAGNOSIS — Z85828 Personal history of other malignant neoplasm of skin: Secondary | ICD-10-CM | POA: Diagnosis not present

## 2019-06-08 DIAGNOSIS — L570 Actinic keratosis: Secondary | ICD-10-CM | POA: Diagnosis not present

## 2019-06-08 DIAGNOSIS — D485 Neoplasm of uncertain behavior of skin: Secondary | ICD-10-CM | POA: Diagnosis not present

## 2019-06-08 DIAGNOSIS — L905 Scar conditions and fibrosis of skin: Secondary | ICD-10-CM | POA: Diagnosis not present

## 2019-06-08 DIAGNOSIS — L218 Other seborrheic dermatitis: Secondary | ICD-10-CM | POA: Diagnosis not present

## 2019-06-08 DIAGNOSIS — D0439 Carcinoma in situ of skin of other parts of face: Secondary | ICD-10-CM | POA: Diagnosis not present

## 2019-06-08 DIAGNOSIS — C44329 Squamous cell carcinoma of skin of other parts of face: Secondary | ICD-10-CM | POA: Diagnosis not present

## 2019-07-01 ENCOUNTER — Other Ambulatory Visit: Payer: Self-pay | Admitting: Cardiology

## 2019-07-02 ENCOUNTER — Other Ambulatory Visit: Payer: Self-pay

## 2019-07-02 ENCOUNTER — Ambulatory Visit: Payer: Self-pay | Admitting: Podiatry

## 2019-07-02 ENCOUNTER — Encounter: Payer: Self-pay | Admitting: Podiatry

## 2019-07-02 DIAGNOSIS — B351 Tinea unguium: Secondary | ICD-10-CM | POA: Insufficient documentation

## 2019-07-02 DIAGNOSIS — E1142 Type 2 diabetes mellitus with diabetic polyneuropathy: Secondary | ICD-10-CM | POA: Diagnosis not present

## 2019-07-02 DIAGNOSIS — M79674 Pain in right toe(s): Secondary | ICD-10-CM | POA: Diagnosis not present

## 2019-07-02 NOTE — Progress Notes (Signed)
Complaint:  Visit Type: Patient returns to my office for continued preventative foot care services. Complaint: Patient states" my nails have grown long and thick and become painful to walk and wear shoes" Patient has been diagnosed with DM with neuropathy. The patient presents for preventative foot care services.  Podiatric Exam: Vascular: dorsalis pedis and posterior tibial pulses are weakly  palpable bilateral. Capillary return is immediate. Temperature gradient is WNL. Skin turgor WNL  Sensorium: Diminished/absent Semmes Weinstein monofilament test. Normal tactile sensation bilaterally. Nail Exam: Pt has thick disfigured discolored nails with subungual debris noted bilateral entire nail hallux through fifth toenails Ulcer Exam: There is no evidence of ulcer or pre-ulcerative changes or infection. Orthopedic Exam: Muscle tone and strength are WNL. No limitations in general ROM. No crepitus or effusions noted. Foot type and digits show no abnormalities. Bony prominences are unremarkable. Plantar flexed fifth met  B/L. Skin: No Porokeratosis. No infection or ulcers  Diagnosis:  Onychomycosis, , Pain in right toe, pain in left toes  Treatment & Plan Procedures and Treatment: Consent by patient was obtained for treatment procedures.   Debridement of mycotic and hypertrophic toenails, 1 through 5 bilateral and clearing of subungual debris. No ulceration, no infection noted. Patient says his foot is wobbly in his new diabetic shoes.  Patient was told to be measured for new diabetic shoes dye to DPN and ulcer.   Return Visit-Office Procedure: Patient instructed to return to the office for a follow up visit 3 months for  Preventative foot care services.    Gardiner Barefoot DPM

## 2019-07-05 ENCOUNTER — Other Ambulatory Visit: Payer: Self-pay | Admitting: Cardiology

## 2019-07-09 DIAGNOSIS — N183 Chronic kidney disease, stage 3 unspecified: Secondary | ICD-10-CM | POA: Diagnosis not present

## 2019-07-09 DIAGNOSIS — R809 Proteinuria, unspecified: Secondary | ICD-10-CM | POA: Diagnosis not present

## 2019-07-09 DIAGNOSIS — I129 Hypertensive chronic kidney disease with stage 1 through stage 4 chronic kidney disease, or unspecified chronic kidney disease: Secondary | ICD-10-CM | POA: Diagnosis not present

## 2019-07-09 DIAGNOSIS — E1122 Type 2 diabetes mellitus with diabetic chronic kidney disease: Secondary | ICD-10-CM | POA: Diagnosis not present

## 2019-07-23 ENCOUNTER — Other Ambulatory Visit: Payer: Self-pay

## 2019-07-23 ENCOUNTER — Telehealth: Payer: Self-pay | Admitting: Cardiology

## 2019-07-23 DIAGNOSIS — I10 Essential (primary) hypertension: Secondary | ICD-10-CM

## 2019-07-23 DIAGNOSIS — I5020 Unspecified systolic (congestive) heart failure: Secondary | ICD-10-CM

## 2019-07-23 DIAGNOSIS — I480 Paroxysmal atrial fibrillation: Secondary | ICD-10-CM

## 2019-07-23 MED ORDER — RIVAROXABAN 15 MG PO TABS: ORAL_TABLET | ORAL | 1 refills | Status: DC

## 2019-07-23 NOTE — Telephone Encounter (Signed)
Prescription refill request for Xarelto received.   Last office visit: 05/13/2019, Erik [name] Weight: 92.7 kg Age: 79 y.o. Scr: 1.53, 03/12/2019 CrCl: 50 ml/min   Prescription refill sent.

## 2019-07-23 NOTE — Telephone Encounter (Signed)
*  STAT* If patient is at the pharmacy, call can be transferred to refill team.   1. Which medications need to be refilled? (please list name of each medication and dose if known) Rivaroxaban (XARELTO) 15 MG TABS tablet  2. Which pharmacy/location (including street and city if local pharmacy) is medication to be sent to? [place], [place]  3. Do they need a 30 day or 90 day supply? 30 day supply

## 2019-08-05 DIAGNOSIS — E1159 Type 2 diabetes mellitus with other circulatory complications: Secondary | ICD-10-CM | POA: Diagnosis not present

## 2019-08-05 DIAGNOSIS — Z125 Encounter for screening for malignant neoplasm of prostate: Secondary | ICD-10-CM | POA: Diagnosis not present

## 2019-08-05 DIAGNOSIS — E7800 Pure hypercholesterolemia, unspecified: Secondary | ICD-10-CM | POA: Diagnosis not present

## 2019-08-12 DIAGNOSIS — I480 Paroxysmal atrial fibrillation: Secondary | ICD-10-CM | POA: Diagnosis not present

## 2019-08-12 DIAGNOSIS — Z7901 Long term (current) use of anticoagulants: Secondary | ICD-10-CM | POA: Diagnosis not present

## 2019-08-12 DIAGNOSIS — E1159 Type 2 diabetes mellitus with other circulatory complications: Secondary | ICD-10-CM | POA: Diagnosis not present

## 2019-08-12 DIAGNOSIS — D6869 Other thrombophilia: Secondary | ICD-10-CM | POA: Diagnosis not present

## 2019-08-12 DIAGNOSIS — R82998 Other abnormal findings in urine: Secondary | ICD-10-CM | POA: Diagnosis not present

## 2019-08-12 DIAGNOSIS — E1129 Type 2 diabetes mellitus with other diabetic kidney complication: Secondary | ICD-10-CM | POA: Diagnosis not present

## 2019-08-12 DIAGNOSIS — Z0000 Encounter for general adult medical examination without abnormal findings: Secondary | ICD-10-CM | POA: Diagnosis not present

## 2019-08-12 DIAGNOSIS — N1832 Chronic kidney disease, stage 3b: Secondary | ICD-10-CM | POA: Diagnosis not present

## 2019-08-12 DIAGNOSIS — I1310 Hypertensive heart and chronic kidney disease without heart failure, with stage 1 through stage 4 chronic kidney disease, or unspecified chronic kidney disease: Secondary | ICD-10-CM | POA: Diagnosis not present

## 2019-08-12 DIAGNOSIS — D126 Benign neoplasm of colon, unspecified: Secondary | ICD-10-CM | POA: Diagnosis not present

## 2019-08-12 DIAGNOSIS — D696 Thrombocytopenia, unspecified: Secondary | ICD-10-CM | POA: Diagnosis not present

## 2019-08-12 DIAGNOSIS — H5340 Unspecified visual field defects: Secondary | ICD-10-CM | POA: Insufficient documentation

## 2019-08-12 DIAGNOSIS — Z1339 Encounter for screening examination for other mental health and behavioral disorders: Secondary | ICD-10-CM | POA: Diagnosis not present

## 2019-08-12 DIAGNOSIS — F411 Generalized anxiety disorder: Secondary | ICD-10-CM | POA: Diagnosis not present

## 2019-08-12 DIAGNOSIS — D692 Other nonthrombocytopenic purpura: Secondary | ICD-10-CM | POA: Diagnosis not present

## 2019-08-12 DIAGNOSIS — Z1331 Encounter for screening for depression: Secondary | ICD-10-CM | POA: Diagnosis not present

## 2019-08-13 DIAGNOSIS — E113211 Type 2 diabetes mellitus with mild nonproliferative diabetic retinopathy with macular edema, right eye: Secondary | ICD-10-CM | POA: Diagnosis not present

## 2019-08-13 DIAGNOSIS — H35371 Puckering of macula, right eye: Secondary | ICD-10-CM | POA: Diagnosis not present

## 2019-08-13 DIAGNOSIS — H5203 Hypermetropia, bilateral: Secondary | ICD-10-CM | POA: Diagnosis not present

## 2019-08-19 DIAGNOSIS — N401 Enlarged prostate with lower urinary tract symptoms: Secondary | ICD-10-CM | POA: Diagnosis not present

## 2019-08-19 DIAGNOSIS — N3941 Urge incontinence: Secondary | ICD-10-CM | POA: Diagnosis not present

## 2019-08-19 DIAGNOSIS — R351 Nocturia: Secondary | ICD-10-CM | POA: Diagnosis not present

## 2019-08-24 ENCOUNTER — Other Ambulatory Visit: Payer: Self-pay | Admitting: General Practice

## 2019-08-24 DIAGNOSIS — H33322 Round hole, left eye: Secondary | ICD-10-CM | POA: Diagnosis not present

## 2019-08-24 DIAGNOSIS — E113293 Type 2 diabetes mellitus with mild nonproliferative diabetic retinopathy without macular edema, bilateral: Secondary | ICD-10-CM | POA: Diagnosis not present

## 2019-08-24 DIAGNOSIS — H43822 Vitreomacular adhesion, left eye: Secondary | ICD-10-CM | POA: Diagnosis not present

## 2019-08-24 DIAGNOSIS — H3581 Retinal edema: Secondary | ICD-10-CM | POA: Diagnosis not present

## 2019-08-24 DIAGNOSIS — H35371 Puckering of macula, right eye: Secondary | ICD-10-CM | POA: Diagnosis not present

## 2019-08-24 NOTE — Patient Outreach (Signed)
Client is newly enrolled in the Special Needs Plan program with Congestive Heart Failure and Type II Diabetes. No ED or acute inpatient visits during 2020. Individualized Care Plan (ICP) completed with information from the  Health Risk Assessment on file, client unable to be reached after three telephonic attempts. Client also has a history of Atrial Fibrillation, Hypertension and Heart Disease. Will send an introductory letter with ICP to the primary provider and client, along with educational materials. Assigned RN Care Coordinator will follow up  within 3 months.

## 2019-08-31 NOTE — Progress Notes (Signed)
Cardiology Office Note:    Date:  09/01/2019   ID:  Erik [name] Jan 07, 1941, MRN [id number]  PCP:  [name], MD  Cardiologist:  Ena Dawley, MD  Referring MD: [name], MD   Reason for visit: 4 months follow up  History of Present Illness:    Erik [name] is a 79 y.o. male with a past medical history significant for no significant CAD on cath in 2004, PVCs, PAF, NICM (variable EF over the years), chronic combined CHF, CKD stage III, DM, HTN, HL (followed by PCP).   The patient underwent cardiac catheterization 2004 for EF 30-35% with finding of minimal irregularities in the LAD otherwise no significant disease.  EF was 35-40% in 2009, 55% in 2010, then back down to 35-40% in 2015 during admission for A. fib with RVR.  At that time he spontaneously converted to normal sinus rhythm on IV diltiazem.  Cardiac enzymes remained normal.  He was placed on Xarelto.  Echocardiogram demonstrated worsening LV function with EF 35-40% and inferior and inferolateral hypokinesis.  Patient was set up for nuclear study as an inpatient which demonstrated mid to apical anteroseptal perfusion defect suggestive of possible infarct but no ischemia, EF 48%.  This was felt to be a low risk study.  Decision was made to treat him medically.  [place] up echo 12/2014 showed EF 50-55%, hypokinesis of the basal mid inferior myocardium, grade 1 diastolic dysfunction, mild MR, mild LAE.  ACE inhibitor had previously been discontinued due to CKD and worsening renal function once reintroduced.  He was last seen in the office on 02/09/2019 for 1 year follow-up at which time he had been experiencing some dizziness and mild lower extremity edema.  He was maintaining normal sinus rhythm but fairly bradycardic with some dizziness so Cardizem was discontinued.  The patient had admitted to eating high sodium foods.  BNP was checked and was elevated so he was switched from as needed Lasix to daily  dosing.  The patient is here for follow-up. He is retired and sits around the house mostly since it got cold. He does a lot of yard work in the spring and summer. He lives with his wife and helps out around the house. He denies any chest pressure or shortness of breath with activity. He is not limited. No orthpnea, PND, palpitations, lightheadedness. His LE edema has resolved.   Labs done on 12/15 showed stable renal function and BNP improved. He was told to go back to taking lasix only as needed, usually every 2-3 days. He is concerned about his kidneys and follows with nephrology.   09/01/2019 -the patient is coming after 4 months, he has been doing well, he has stable weight since the last visit, he only needs to take Lasix approximately once a week, he has minimal on and off lower extremity edema no orthopnea or proximal nocturnal dyspnea.  Stable dyspnea on exertion no chest pain.  He is very active in his complaint are claudications, pain in his both legs that resolves at rest.   Cardiac studies    Past Medical History:  Diagnosis Date  . Chronic combined systolic and diastolic CHF (congestive heart failure) ([place])   . CKD (chronic kidney disease), stage III   . Coronary artery disease    a. Nonobstructive by cath 2004. b. Nuc 09/2013 - no ischemia, fixed defect suggestive of possible prior infarction, felt low risk, EF 48%.   . Diabetes mellitus   .  GERD (gastroesophageal reflux disease)   . Hyperlipidemia   . Hypertension   . NICM (nonischemic cardiomyopathy) ([place])    a. EF 35-40% in 2009, 55% in 2010. b. 09/2013: 35-40% by echo and 48% by nuc. c. EF 50-55% by echo 12/2014.  Erik [name] PAF (paroxysmal atrial fibrillation) ([place])    a. 09/2013: RVR then bradycardia then spont conv to NSR.  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 Premature ventricular contractions     Past Surgical History:  Procedure Laterality Date  . CARDIAC CATHETERIZATION  10/25/00, 12/29/02  . CHOLECYSTECTOMY N/A  01/25/2015   Procedure: LAPAROSCOPIC CHOLECYSTECTOMY ;  Surgeon: [name], MD;  Location: [facility];  Service: General;  Laterality: N/A;  . SHOULDER SURGERY Left 2011   Shoulder surgery, rod inserted    Current Medications: Current Meds  Medication Sig  . acetaminophen (TYLENOL) 325 MG tablet Take 2 tablets (650 mg total) by mouth every 6 (six) hours as needed for mild pain (or Fever >/= 101).  Erik [name] arginine 500 MG tablet Take 1,000 mg by mouth daily.  Erik [name] atorvastatin (LIPITOR) 40 MG tablet Take 1 tablet (40 mg total) by mouth daily at 6 PM.  . BD PEN NEEDLE NANO U/F 32G X 4 MM MISC USE 1 NEEDLE WITH INSULIN INJECTION ONCE DAILY  . citalopram (CELEXA) 40 MG tablet Take 40 mg by mouth daily.  . clonazePAM (KLONOPIN) 0.5 MG tablet Take 0.5 mg by mouth daily as needed.   . Coenzyme Q10 (COQ10) 200 MG CAPS Take 100 mg by mouth daily.   . finasteride (PROSCAR) 5 MG tablet Take 5 mg by mouth once daily  . furosemide (LASIX) 40 MG tablet Take 1 tablet (40 mg total) by mouth as needed for fluid or edema.  . metoprolol tartrate (LOPRESSOR) 50 MG tablet TAKE 1 AND 1/2 TABLETS(75 MG) BY MOUTH TWICE DAILY  . Multiple Vitamin (MULTIVITAMIN) capsule Take 1 capsule by mouth daily.  Erik [name] omeprazole (PRILOSEC) 20 MG capsule Take 20 mg by mouth daily.  . Rivaroxaban (XARELTO) 15 MG TABS tablet TAKE 1 TABLET(15 MG) BY MOUTH DAILY WITH SUPPER  . selenium 200 MCG TABS tablet Take 200 mcg by mouth daily.  . tamsulosin (FLOMAX) 0.4 MG CAPS capsule Take 0.4 mg by mouth at bedtime.  Erik [name] SOLOSTAR 300 UNIT/ML SOPN Inject 36 Units as directed daily.      Allergies:   Claritin [loratadine] and Tradjenta [linagliptin]   Social History   Socioeconomic History  . Marital status: Married    Spouse name: Not on file  . Number of children: Not on file  . Years of education: Not on file  . Highest education level: Not on file  Occupational History  . Occupation: retired    Comment: from maintenance  Tobacco  Use  . Smoking status: Former Smoker    Packs/day: 0.50    Years: 10.00    Pack years: 5.00    Types: Cigarettes    Quit date: 01/21/1985    Years since quitting: 34.6  . Smokeless tobacco: Never Used  . Tobacco comment: quit 35 yrs ago  Substance and Sexual Activity  . Alcohol use: No  . Drug use: No  . Sexual activity: Not Currently    Birth control/protection: None  Other Topics Concern  . Not on file  Social History Narrative   Lives with wife in a one story home.  Has 3 children.  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  Education: high school.   Social Determinants of Health   Financial Resource Strain:   . Difficulty of Paying Living Expenses:   Food Insecurity:   . Worried About Charity fundraiser in the Last Year:   . Arboriculturist in the Last Year:   Transportation Needs:   . Film/video editor (Medical):   Erik [name] Lack of Transportation (Non-Medical):   Physical Activity:   . Days of Exercise per Week:   . Minutes of Exercise per Session:   Stress:   . Feeling of Stress :   Social Connections:   . Frequency of Communication with Friends and Family:   . Frequency of Social Gatherings with Friends and Family:   . Attends Religious Services:   . Active Member of Clubs or Organizations:   . Attends Archivist Meetings:   Erik [name] Marital Status:      Family History: The patient's family history includes Diabetes in his brother and mother; Heart disease in his brother, father, and mother; Hypertension in his brother and mother. There is no history of Heart attack or Stroke. ROS:   Please see the history of present illness.    All other systems reviewed and are negative.  EKG:  EKG is ordered today.  The ekg ordered today demonstrates sinus rhythm with PAC and PVC, 66 bpm, QTC 488  Recent Labs: 02/09/2019: ALT 12; Hemoglobin 12.6; Platelets 133; TSH 2.280 05/11/2019: BUN 16; Creatinine, Ser 1.53; NT-Pro BNP 1,964; Potassium 4.2; Sodium 143    Recent Lipid Panel No results found for: CHOL, TRIG, HDL, CHOLHDL, VLDL, LDLCALC, LDLDIRECT  Physical Exam:    VS:  BP 124/68   Pulse 61   Ht 5\' 10"  (1.778 m)   Wt 202 lb 3.2 oz (91.7 kg)   SpO2 95%   BMI 29.01 kg/m     Wt Readings from Last 6 Encounters:  09/01/19 202 lb 3.2 oz (91.7 kg)  05/13/19 204 lb 6.4 oz (92.7 kg)  02/09/19 200 lb 1.9 oz (90.8 kg)  04/01/18 209 lb (94.8 kg)  03/18/18 216 lb (98 kg)  09/29/17 206 lb 9.6 oz (93.7 kg)    GEN:  Well nourished, well developed in no acute distress HEENT: Normal NECK: No JVD; No carotid bruits LYMPHATICS: No lymphadenopathy CARDIAC: RRR, no murmurs, rubs, gallops RESPIRATORY:  Clear to auscultation without rales, wheezing or rhonchi  ABDOMEN: Soft, non-tender, non-distended MUSCULOSKELETAL:  No edema; No deformity, no pulses at DP/PT B/L SKIN: Warm and dry NEUROLOGIC:  Alert and oriented x 3  ASSESSMENT:    1. Claudication ([place])   2. Paroxysmal atrial fibrillation ([place])   3. Essential hypertension   4. Chronic combined systolic and diastolic CHF (congestive heart failure) ([place])   5. NICM (nonischemic cardiomyopathy) ([place])   6. Stage 3 chronic kidney disease, unspecified whether stage 3a or 3b CKD    PLAN:    In order of problems listed above:  Claudications -Poor peripheral pulses bilaterally -We will obtain bilateral lower extremity arterial ultrasound.  NICM/chronic combined heart failure, acute on chronic -Patient seems to be euvolemic today -proBNP was elevated at 3277 in September, 1964 in December 2020, Creatinine improved to 1.5, Creatinine 1.6 on 08/05/2019. -Reduce sodium intake, he says that he is doing so. -Pt is now taking lasix as needed about every 1 to 2 weeks  Paroxysmal atrial fibrillation -Maintaining sinus rhythm on current regimen.  Cardizem was discontinued in September due to bradycardia with some dizziness.  He continues  on metoprolol 75 mg twice daily -On rivaroxaban for stroke risk  reduction.  Reduced dose for kidney function with Creat clearance ~50.   CKD stage III -In the past serum creatinine was up to 2.6,  Creatinine 1.6 on 08/05/2019. -Follows with nephrology.   Essential hypertension -Cardizem discontinued due to soft blood pressure and bradycardia. -Continues of metoprolol. BP well controlled.   Hyperlipidemia -On atorvastatin 40 mg daily, most recent lipids on 08/05/2019 LDL 63, HDL 37 triglycerides 88.  Medication Adjustments/Labs and Tests Ordered: Current medicines are reviewed at length with the patient today.  Concerns regarding medicines are outlined above. Labs and tests ordered and medication changes are outlined in the patient instructions below:  Patient Instructions  Medication Instructions:   Your physician recommends that you continue on your current medications as directed. Please refer to the Current Medication list given to you today.  *If you need a refill on your cardiac medications before your next appointment, please call your pharmacy*   Testing/Procedures:  Your physician has requested that you have a lower  extremity arterial duplex. This test is an ultrasound of the arteries in the legs or arms. It looks at arterial blood flow in the legs and arms. Allow one hour for Lower and Upper Arterial scans. There are no restrictions or special instructions   Follow-Up:  4 MONTHS WITH AN EXTENDER IN OUR OFFICE--IN PERSON VISIT      Signed, Ena Dawley, MD  09/01/2019 9:42 AM    [place]

## 2019-09-01 ENCOUNTER — Encounter: Payer: Self-pay | Admitting: Cardiology

## 2019-09-01 ENCOUNTER — Ambulatory Visit (INDEPENDENT_AMBULATORY_CARE_PROVIDER_SITE_OTHER): Payer: HMO | Admitting: Cardiology

## 2019-09-01 ENCOUNTER — Other Ambulatory Visit: Payer: Self-pay | Admitting: Cardiology

## 2019-09-01 ENCOUNTER — Other Ambulatory Visit: Payer: Self-pay

## 2019-09-01 VITALS — BP 124/68 | HR 61 | Ht 70.0 in | Wt 202.2 lb

## 2019-09-01 DIAGNOSIS — I739 Peripheral vascular disease, unspecified: Secondary | ICD-10-CM

## 2019-09-01 DIAGNOSIS — I480 Paroxysmal atrial fibrillation: Secondary | ICD-10-CM

## 2019-09-01 DIAGNOSIS — I5042 Chronic combined systolic (congestive) and diastolic (congestive) heart failure: Secondary | ICD-10-CM | POA: Diagnosis not present

## 2019-09-01 DIAGNOSIS — I428 Other cardiomyopathies: Secondary | ICD-10-CM | POA: Diagnosis not present

## 2019-09-01 DIAGNOSIS — N183 Chronic kidney disease, stage 3 unspecified: Secondary | ICD-10-CM | POA: Diagnosis not present

## 2019-09-01 DIAGNOSIS — I10 Essential (primary) hypertension: Secondary | ICD-10-CM

## 2019-09-01 NOTE — Patient Instructions (Signed)
Medication Instructions:   Your physician recommends that you continue on your current medications as directed. Please refer to the Current Medication list given to you today.  *If you need a refill on your cardiac medications before your next appointment, please call your pharmacy*   Testing/Procedures:  Your physician has requested that you have a lower  extremity arterial duplex. This test is an ultrasound of the arteries in the legs or arms. It looks at arterial blood flow in the legs and arms. Allow one hour for Lower and Upper Arterial scans. There are no restrictions or special instructions   Follow-Up:  4 MONTHS WITH AN EXTENDER IN OUR OFFICE--IN PERSON VISIT

## 2019-09-03 DIAGNOSIS — H25043 Posterior subcapsular polar age-related cataract, bilateral: Secondary | ICD-10-CM | POA: Diagnosis not present

## 2019-09-03 DIAGNOSIS — H35371 Puckering of macula, right eye: Secondary | ICD-10-CM | POA: Diagnosis not present

## 2019-09-03 DIAGNOSIS — H2513 Age-related nuclear cataract, bilateral: Secondary | ICD-10-CM | POA: Diagnosis not present

## 2019-09-06 ENCOUNTER — Ambulatory Visit (HOSPITAL_COMMUNITY): Payer: HMO

## 2019-09-06 DIAGNOSIS — H3581 Retinal edema: Secondary | ICD-10-CM | POA: Diagnosis not present

## 2019-09-09 ENCOUNTER — Ambulatory Visit (HOSPITAL_COMMUNITY)
Admission: RE | Admit: 2019-09-09 | Discharge: 2019-09-09 | Disposition: A | Discharge status: Home / Self Care | Payer: HMO | Source: Ambulatory Visit | Attending: Cardiology | Admitting: Cardiology

## 2019-09-09 ENCOUNTER — Other Ambulatory Visit: Payer: Self-pay

## 2019-09-09 DIAGNOSIS — I739 Peripheral vascular disease, unspecified: Secondary | ICD-10-CM | POA: Diagnosis not present

## 2019-09-13 ENCOUNTER — Telehealth: Payer: Self-pay | Admitting: *Deleted

## 2019-09-13 DIAGNOSIS — I771 Stricture of artery: Secondary | ICD-10-CM

## 2019-09-13 DIAGNOSIS — I2583 Coronary atherosclerosis due to lipid rich plaque: Secondary | ICD-10-CM

## 2019-09-13 DIAGNOSIS — R936 Abnormal findings on diagnostic imaging of limbs: Secondary | ICD-10-CM

## 2019-09-13 DIAGNOSIS — E782 Mixed hyperlipidemia: Secondary | ICD-10-CM

## 2019-09-13 DIAGNOSIS — I739 Peripheral vascular disease, unspecified: Secondary | ICD-10-CM

## 2019-09-13 DIAGNOSIS — I70203 Unspecified atherosclerosis of native arteries of extremities, bilateral legs: Secondary | ICD-10-CM

## 2019-09-13 DIAGNOSIS — I70209 Unspecified atherosclerosis of native arteries of extremities, unspecified extremity: Secondary | ICD-10-CM

## 2019-09-13 DIAGNOSIS — I70202 Unspecified atherosclerosis of native arteries of extremities, left leg: Secondary | ICD-10-CM

## 2019-09-13 DIAGNOSIS — I2510 Atherosclerotic heart disease of native coronary artery without angina pectoris: Secondary | ICD-10-CM

## 2019-09-13 NOTE — Telephone Encounter (Signed)
-----   Message from [name], MD sent at 09/09/2019  4:53 PM EDT ----- Please refer to vascular surgery

## 2019-09-13 NOTE — Telephone Encounter (Signed)
Pt made aware of LE Arterial US results and recommendations per Dr. [name], for him to be referred to Vascular Surgery. Informed the pt that I will place the referral in the system and send a message to our [facility] schedulers, to call him back and arrange this consult appt. Pt verbalized understanding and agrees with this plan.

## 2019-09-16 ENCOUNTER — Other Ambulatory Visit: Payer: Self-pay | Admitting: *Deleted

## 2019-09-16 ENCOUNTER — Encounter: Payer: Self-pay | Admitting: *Deleted

## 2019-09-16 NOTE — Patient Outreach (Signed)
[name]) Care Management Chronic Special Needs Program  09/16/2019  Name: Erik [name] DOB: 1940/09/09  MRN: [id number]  [name] is enrolled in a chronic special needs plan for Heart failure. Chronic Care Management Coordinator telephoned client to review health risk assessment and to develop individualized care plan.  Introduced the chronic care management program, importance of client participation, and taking their care plan to all provider appointments and inpatient facilities.  Reviewed the transition of care process and possible referral to community care management.  Subjective: Client reports he lives with his spouse and is independent in all aspects of his care, continues to drive, states weighs every 2-3 days and records weight, states this is how he has been weighing and not interested in daily weights.  Reports familiar with heart failure action plan, states not sure if he has HTA calendar and requests one be mailed so he can utilize action plan for HF, diabetes and atrial fibrillation in back of calendar and may use to record weight and blood sugar.  Client states he does not "do fluid restriction unless my feet start to swell, then I cut back"  Client states CBG fasting ranges 80-90's and random ranges 80-175, weight 191 pounds.  Client states he recently got diabetic shoes, is to follow up with eye doctor for cataract removal right eye and laser to left eye, follows up with nephrologist, urologist, podiatrist as needed.  Client has been referred to vein/ vascular for claudication and states will be seeing them within next month.  Client states cost of xarelto is too high and he cannot afford, requests assistance from pharmacy, client also has personal care goal on HRA stating " would love to get off some of these drugs if possible"  Goals Addressed            This Visit's Progress   .  Acknowledge receipt of Pension scheme manager mailed Advanced directives packet to client Please call if you need any assistance completing    . "would love to get off some of these drugs if possible" (pt-stated)       RN care manager reviewed medications with client RN care manager placed order for pharmacy referral for medication review with client    . Client understands the importance of follow-up with providers by attending scheduled visits   On track    Review of medical record indicates client has completed 2 cardiology office visits in 2020 and one visit in 2021, client reports he saw primary care provider in February 2021 Call and schedule a yearly physical and follow up visit as recommended by your health care provider.     . Client verbalize knowledge of Heart Failure disease self management skills by discussing information with RN during telephone assessments.       Please review your Heart Failure action plan/ zones in the back of [facility] [employer] calendar. Please weigh daily and record weights. Please follow a low salt diet Call your health care provider for worsening symptoms such as 3 pound weight gain over night or 5 pounds in a week, increased shortness or breath or swelling in your legs and feet. Please call 911 for severe symptoms such as unrelieved shortness of breath and chest pain RN care manager EMMI education article "Heart Failure- about rest, exercise and blood pressure" Remember to call 24 hour nurse advice line if needed at [phone]          .  Client will report no worsening of symptoms of Atrial Fibrillation within the next 12 months   On track    Please review A-fib Action Plan in the back of [employer] calendar. Please report to your health care provider any worsening of symptoms such as racing or irregular heart beat, dizziness, or weakness. Call 911 for emergency such as chest pain, fainting, struggling to breathe. Please follow up with your health care provider as  recommended.     . Client will report no worsening of symptoms related to heart disease within the next 12 months   On track    Continue to follow up with primary care provider and cardiologist Take medications as prescribed Choose nutritious foods such as lean meats, fruits and vegetables including fiber Continue to work in your yard and around the house    . Client will verbalize knowledge of diabetes self-management as evidenced by Hgb A1C <7 or as defined by provider.       Per medical record review Hgb AIC completed on 08/05/19   7.1 Diabetes self management actions as follows- Continue to keep your follow up appointments with your provider and have lab work completed as recommended. Monitor glucose (blood sugar) per health care provider recommendation. Check feet daily Visit health care provider every 3-6 months as directed. It is important to have your Hgb AIC checked every 3-6 months (every 6 months if you are at goal and every 3 months if you are not at goal). Eye exam yearly Carbohydrate controlled meal planning Take diabetes medications as prescribed by health care provider Physical activity RN care manager mailed to client EMMI education article " Diabetes care check list"     . Client will verbalize knowledge of self management of Hypertension as evidences by BP reading of 140/90 or less; or as defined by provider   On track    Take blood pressure medications as prescribed. Plan to check blood pressure regularly and take results to your health care provider appointments. Plan to follow a low salt diet. Increase activity as tolerated. Follow up with your health care provider as recommended. Please ask your health care provider " what is my target blood pressure range". Blood pressure 124/68 on 09/01/19    . Maintain timely refills of Heart Failure medication as prescribed within the year        Per dispense report, client is filling medications on time Client reports  taking medications as prescribed RN care manager placed order for pharmacist for medication assistance Please expect a call from pharmacist due to unable to afford xarelto    . Obtain annual  Lipid Profile, LDL-C   On track    Per medical record review, Lipid profile completed on 08/05/19 LDL= 63 The goal for LDL is less than 70mg /dl as you are at high risk for complications. Try to avoid saturated fats, trans-fats and eat more fiber. Continue to follow up with your health care provider for any needed lab work.     . Visit Primary Care Provider or Cardiologist at least 2 times per year   On track    Client saw cardiologist 2 times in 2020 and once in 2021 Continue to follow up with primary care provider or cardiologist for visits and recommended lab work       Plan:    Consulting civil engineer faxed today's note and individualized care plan to primary care provider, mailed individualized care plan to client's home including Advanced directives packet, consent  form, 24 hour nurse line magnet, HTA calendar, EMMI education articles.  Chronic care management coordination will outreach in:  10-12 months  Will refer client to:  Pharmacy for medication assistance and medication review with client  Jacqlyn Larsen [facility], BSN [place] Coordinator [phone]     Kassie Mends Nursing/RN Coord [facility] Case Manager, C-SNP

## 2019-09-16 NOTE — Patient Outreach (Signed)
Pharmacy referral has been sent to [facility] through their [facility] portal.   Ticket number [id number]  Client states on HRA for his personal goal "would love to get off some of these drugs if possible"   Client states he cannot afford Xarelto.  Thank you

## 2019-09-20 DIAGNOSIS — H33322 Round hole, left eye: Secondary | ICD-10-CM | POA: Diagnosis not present

## 2019-09-23 DIAGNOSIS — H21561 Pupillary abnormality, right eye: Secondary | ICD-10-CM | POA: Diagnosis not present

## 2019-09-23 DIAGNOSIS — H25811 Combined forms of age-related cataract, right eye: Secondary | ICD-10-CM | POA: Diagnosis not present

## 2019-09-23 DIAGNOSIS — H2511 Age-related nuclear cataract, right eye: Secondary | ICD-10-CM | POA: Diagnosis not present

## 2019-09-23 DIAGNOSIS — H25011 Cortical age-related cataract, right eye: Secondary | ICD-10-CM | POA: Diagnosis not present

## 2019-09-23 DIAGNOSIS — H2181 Floppy iris syndrome: Secondary | ICD-10-CM | POA: Diagnosis not present

## 2019-09-23 DIAGNOSIS — H25041 Posterior subcapsular polar age-related cataract, right eye: Secondary | ICD-10-CM | POA: Diagnosis not present

## 2019-10-01 ENCOUNTER — Ambulatory Visit: Payer: Self-pay | Admitting: Podiatry

## 2019-10-05 DIAGNOSIS — H33322 Round hole, left eye: Secondary | ICD-10-CM | POA: Diagnosis not present

## 2019-10-22 DIAGNOSIS — H35371 Puckering of macula, right eye: Secondary | ICD-10-CM | POA: Diagnosis not present

## 2019-10-27 DIAGNOSIS — N2581 Secondary hyperparathyroidism of renal origin: Secondary | ICD-10-CM | POA: Diagnosis not present

## 2019-10-27 DIAGNOSIS — I480 Paroxysmal atrial fibrillation: Secondary | ICD-10-CM | POA: Diagnosis not present

## 2019-10-27 DIAGNOSIS — E785 Hyperlipidemia, unspecified: Secondary | ICD-10-CM | POA: Diagnosis not present

## 2019-10-27 DIAGNOSIS — I129 Hypertensive chronic kidney disease with stage 1 through stage 4 chronic kidney disease, or unspecified chronic kidney disease: Secondary | ICD-10-CM | POA: Diagnosis not present

## 2019-10-27 DIAGNOSIS — E1122 Type 2 diabetes mellitus with diabetic chronic kidney disease: Secondary | ICD-10-CM | POA: Diagnosis not present

## 2019-10-27 DIAGNOSIS — N183 Chronic kidney disease, stage 3 unspecified: Secondary | ICD-10-CM | POA: Diagnosis not present

## 2019-10-27 DIAGNOSIS — R809 Proteinuria, unspecified: Secondary | ICD-10-CM | POA: Diagnosis not present

## 2019-10-27 DIAGNOSIS — Q631 Lobulated, fused and horseshoe kidney: Secondary | ICD-10-CM | POA: Diagnosis not present

## 2019-11-02 ENCOUNTER — Other Ambulatory Visit: Payer: Self-pay

## 2019-11-02 ENCOUNTER — Encounter: Payer: Self-pay | Admitting: Vascular Surgery

## 2019-11-02 ENCOUNTER — Ambulatory Visit (INDEPENDENT_AMBULATORY_CARE_PROVIDER_SITE_OTHER): Payer: HMO | Admitting: Vascular Surgery

## 2019-11-02 DIAGNOSIS — I739 Peripheral vascular disease, unspecified: Secondary | ICD-10-CM

## 2019-11-02 NOTE — Progress Notes (Signed)
Patient name: Erik [name] MRN: [id number] DOB: 08/13/1940 Sex: male  REASON FOR CONSULT: Abnormal lower extremity arterial ultrasound  HPI: Erik [name] is a 79 y.o. male, with history of hypertension, A. fib on Xarelto, CKD stage III, heart failure, diabetes that presents for evaluation of abnormal lower extremity arterial studies.  When asked about his symptoms he stated that he had some pain in his right hip that has been intermittent.  At times he states he can walk through [place] or mow his yard without having any significant problems.  No significant pain in the thigh or calf itself.  No rest pain in the foot at night.  No nonhealing wounds.  States the left leg is fine.  He actually ripped the toenail off left toenail in clinic and is bleeding pretty profusely.  He had lower extremity arterial studies on 09/09/2019 with an ABI of 1.15 on the right triphasic and 1.1 on the left biphasic with normal toe pressures and no evidence of lower extremity arterial disease.  Past Medical History:  Diagnosis Date  . Chronic combined systolic and diastolic CHF (congestive heart failure) ([place])   . CKD (chronic kidney disease), stage III   . Coronary artery disease    a. Nonobstructive by cath 2004. b. Nuc 09/2013 - no ischemia, fixed defect suggestive of possible prior infarction, felt low risk, EF 48%.   . Diabetes mellitus   . GERD (gastroesophageal reflux disease)   . Hyperlipidemia   . Hypertension   . NICM (nonischemic cardiomyopathy) ([place])    a. EF 35-40% in 2009, 55% in 2010. b. 09/2013: 35-40% by echo and 48% by nuc. c. EF 50-55% by echo 12/2014.  Erik [name] PAF (paroxysmal atrial fibrillation) ([place])    a. 09/2013: RVR then bradycardia then spont conv to NSR.  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 Premature ventricular contractions     Past Surgical History:  Procedure Laterality Date  . CARDIAC CATHETERIZATION  10/25/00, 12/29/02  . CHOLECYSTECTOMY N/A 01/25/2015   Procedure:  LAPAROSCOPIC CHOLECYSTECTOMY ;  Surgeon: [name], MD;  Location: [place];  Service: General;  Laterality: N/A;  . SHOULDER SURGERY Left 2011   Shoulder surgery, rod inserted    Family History  Problem Relation Age of Onset  . Heart disease Mother   . Diabetes Mother   . Hypertension Mother   . Heart disease Father   . Heart disease Brother   . Diabetes Brother   . Hypertension Brother   . Heart attack Neg Hx   . Stroke Neg Hx     SOCIAL HISTORY: Social History   Socioeconomic History  . Marital status: Married    Spouse name: Not on file  . Number of children: Not on file  . Years of education: Not on file  . Highest education level: Not on file  Occupational History  . Occupation: retired    Comment: from maintenance  Tobacco Use  . Smoking status: Former Smoker    Packs/day: 0.50    Years: 10.00    Pack years: 5.00    Types: Cigarettes    Quit date: 01/21/1985    Years since quitting: 34.8  . Smokeless tobacco: Never Used  . Tobacco comment: quit 35 yrs ago  Substance and Sexual Activity  . Alcohol use: No  . Drug use: No  . Sexual activity: Not Currently    Birth control/protection: None  Other Topics Concern  . Not on file  Social History Narrative   Lives with wife in a one story home.  Has 3 children.  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     Education: high school.   Social Determinants of Health   Financial Resource Strain:   . Difficulty of Paying Living Expenses:   Food Insecurity: No Food Insecurity  . Worried About Charity fundraiser in the Last Year: Never true  . Ran Out of Food in the Last Year: Never true  Transportation Needs:   . Lack of Transportation (Medical):   Erik [name] Lack of Transportation (Non-Medical):   Physical Activity:   . Days of Exercise per Week:   . Minutes of Exercise per Session:   Stress:   . Feeling of Stress :   Social Connections:   . Frequency of Communication with Friends and Family:   .  Frequency of Social Gatherings with Friends and Family:   . Attends Religious Services:   . Active Member of Clubs or Organizations:   . Attends Archivist Meetings:   Erik [name] Marital Status:   Intimate Partner Violence:   . Fear of Current or Ex-Partner:   . Emotionally Abused:   Erik [name] Physically Abused:   . Sexually Abused:     Allergies  Allergen Reactions  . Claritin [Loratadine] Other (See Comments)    Urinary retention  . Tradjenta [Linagliptin] Other (See Comments)    High blood sugar    Current Outpatient Medications  Medication Sig Dispense Refill  . acetaminophen (TYLENOL) 325 MG tablet Take 2 tablets (650 mg total) by mouth every 6 (six) hours as needed for mild pain (or Fever >/= 101).    Erik [name] arginine 500 MG tablet Take 1,000 mg by mouth daily.    Erik [name] atorvastatin (LIPITOR) 40 MG tablet Take 1 tablet (40 mg total) by mouth daily at 6 PM. 90 tablet 3  . BD PEN NEEDLE NANO U/F 32G X 4 MM MISC USE 1 NEEDLE WITH INSULIN INJECTION ONCE DAILY  2  . citalopram (CELEXA) 40 MG tablet Take 40 mg by mouth daily.  6  . clonazePAM (KLONOPIN) 0.5 MG tablet Take 0.5 mg by mouth daily as needed.   0  . Coenzyme Q10 (COQ10) 200 MG CAPS Take 100 mg by mouth daily.     . finasteride (PROSCAR) 5 MG tablet Take 5 mg by mouth once daily  3  . furosemide (LASIX) 40 MG tablet Take 1 tablet (40 mg total) by mouth as needed for fluid or edema. 90 tablet 2  . metoprolol tartrate (LOPRESSOR) 50 MG tablet TAKE 1 AND 1/2 TABLETS(75 MG) BY MOUTH TWICE DAILY 270 tablet 3  . Multiple Vitamin (MULTIVITAMIN) capsule Take 1 capsule by mouth daily.    Erik [name] omeprazole (PRILOSEC) 20 MG capsule Take 20 mg by mouth daily.    . Rivaroxaban (XARELTO) 15 MG TABS tablet TAKE 1 TABLET(15 MG) BY MOUTH DAILY WITH SUPPER 90 tablet 1  . selenium 200 MCG TABS tablet Take 200 mcg by mouth daily.    . tamsulosin (FLOMAX) 0.4 MG CAPS capsule Take 0.4 mg by mouth at bedtime.    Erik [name] SOLOSTAR 300 UNIT/ML SOPN Inject 36 Units  as directed daily.   4   No current facility-administered medications for this visit.    REVIEW OF SYSTEMS:  [X]  denotes positive finding, [ ]  denotes negative finding Cardiac  Comments:  Chest pain or chest pressure:    Shortness of breath upon exertion:    Short  of breath when lying flat:    Irregular heart rhythm:        Vascular    Pain in calf, thigh, or hip brought on by ambulation:    Pain in feet at night that wakes you up from your sleep:     Blood clot in your veins:    Leg swelling:         Pulmonary    Oxygen at home:    Productive cough:     Wheezing:         Neurologic    Sudden weakness in arms or legs:     Sudden numbness in arms or legs:     Sudden onset of difficulty speaking or slurred speech:    Temporary loss of vision in one eye:     Problems with dizziness:         Gastrointestinal    Blood in stool:     Vomited blood:         Genitourinary    Burning when urinating:     Blood in urine:        Psychiatric    Major depression:         Hematologic    Bleeding problems:    Problems with blood clotting too easily:        Skin    Rashes or ulcers:        Constitutional    Fever or chills:      PHYSICAL EXAM: Vitals:   11/02/19 1253  BP: 125/70  Pulse: 68  Resp: 18  Temp: (!) 97.2 F (36.2 C)  TempSrc: Temporal  SpO2: 97%  Weight: 189 lb (85.7 kg)  Height: 5\' 10"  (1.778 m)    GENERAL: The patient is a well-nourished male, in no acute distress. The vital signs are documented above. CARDIAC: There is a regular rate and rhythm.  VASCULAR:  Palpable femoral pulses bilaterally Right DP palpable Left PT palpable PULMONARY: There is good air exchange bilaterally without wheezing or rales. ABDOMEN: Soft and non-tender with normal pitched bowel sounds.  MUSCULOSKELETAL: There are no major deformities or cyanosis. NEUROLOGIC: No focal weakness or paresthesias are detected. SKIN: There are no ulcers or rashes noted. PSYCHIATRIC: The  patient has a normal affect.  DATA:   I independently reviewed his noninvasive imaging which shows normal ABIs greater than 1 at the ankle.  Right lower extremity arterial duplex shows a 30 to 49% stenosis in the right SFA and on the left has a 30 to 49% stenosis in the SFA as well as a moderate 50 to 74% stenosis in the AT  Assessment/Plan:  79 year old male presents for evaluation of PVD in the setting of recent non-invasive arterial studies.  I discussed with the patient that he has essentially normal ABIs greater than 1 at the ankle and he has palpable pedal pulses with a right DP and a left PT that I can appreciate on exam.  There was no critical stenosis identified on his lower extremity arterial duplex although he does have evidence of atheresclerosis and mild PAD.  He is not really having any significant symptoms that I think correlate with claudication or rest pain and no evidence of non-healing wounds..  His main complaint is pain in the right hip and I do not think this is related to arterial disease.  Discussed the importance of medical management and he is already taking a statin, doing walking therapies, as well as not smoking etc.  Discussed  would be happy to see him in the future and follow-up if anything changes.   Marty Heck, MD [facility] Office: [phone]

## 2019-11-16 DIAGNOSIS — H35371 Puckering of macula, right eye: Secondary | ICD-10-CM | POA: Diagnosis not present

## 2019-11-17 ENCOUNTER — Other Ambulatory Visit: Payer: Self-pay

## 2019-11-17 ENCOUNTER — Ambulatory Visit (INDEPENDENT_AMBULATORY_CARE_PROVIDER_SITE_OTHER): Payer: HMO | Admitting: Podiatry

## 2019-11-17 ENCOUNTER — Encounter: Payer: Self-pay | Admitting: Podiatry

## 2019-11-17 DIAGNOSIS — B351 Tinea unguium: Secondary | ICD-10-CM | POA: Diagnosis not present

## 2019-11-17 DIAGNOSIS — N183 Chronic kidney disease, stage 3 unspecified: Secondary | ICD-10-CM

## 2019-11-17 DIAGNOSIS — I739 Peripheral vascular disease, unspecified: Secondary | ICD-10-CM | POA: Diagnosis not present

## 2019-11-17 DIAGNOSIS — M79674 Pain in right toe(s): Secondary | ICD-10-CM

## 2019-11-17 DIAGNOSIS — E1142 Type 2 diabetes mellitus with diabetic polyneuropathy: Secondary | ICD-10-CM

## 2019-11-17 NOTE — Progress Notes (Signed)
This patient returns to my office for at risk foot care.  This patient requires this care by a professional since this patient will be at risk due to having CKD, Diabetes and PAD.  Asymptomatic porokeratosis  B/L.This patient is unable to cut nails himself since the patient cannot reach his nails.These nails are painful walking and wearing shoes.  This patient presents for at risk foot care today.  General Appearance  Alert, conversant and in no acute stress.  Vascular  Dorsalis pedis and posterior tibial  pulses are palpable  bilaterally.  Capillary return is within normal limits  bilaterally. Temperature is within normal limits  bilaterally.  Neurologic  Senn-Weinstein monofilament wire test within normal limits  bilaterally. Muscle power within normal limits bilaterally.  Nails Thick disfigured discolored nails with subungual debris  from hallux to fifth toes bilaterally. No evidence of bacterial infection or drainage bilaterally.  Orthopedic  No limitations of motion  feet .  No crepitus or effusions noted.  No bony pathology or digital deformities noted.  Skin  normotropic skin with no porokeratosis noted bilaterally.  No signs of infections or ulcers noted.     Onychomycosis  Pain in right toes  Pain in left toes  Consent was obtained for treatment procedures.   Mechanical debridement of nails 1-5  bilaterally performed with a nail nipper.  Filed with dremel without incident.    Return office visit   3 months                   Told patient to return for periodic foot care and evaluation due to potential at risk complications.   Gardiner Barefoot DPM

## 2019-11-18 DIAGNOSIS — H21562 Pupillary abnormality, left eye: Secondary | ICD-10-CM | POA: Diagnosis not present

## 2019-11-18 DIAGNOSIS — H25812 Combined forms of age-related cataract, left eye: Secondary | ICD-10-CM | POA: Diagnosis not present

## 2019-11-18 DIAGNOSIS — H2512 Age-related nuclear cataract, left eye: Secondary | ICD-10-CM | POA: Diagnosis not present

## 2019-11-18 DIAGNOSIS — H25012 Cortical age-related cataract, left eye: Secondary | ICD-10-CM | POA: Diagnosis not present

## 2019-11-18 DIAGNOSIS — H25042 Posterior subcapsular polar age-related cataract, left eye: Secondary | ICD-10-CM | POA: Diagnosis not present

## 2019-11-29 ENCOUNTER — Encounter: Payer: Self-pay | Admitting: Physician Assistant

## 2019-11-29 NOTE — Progress Notes (Deleted)
Cardiology Office Note    Date:  11/29/2019   ID:  Erik, [name] Oct 05, 1940, MRN [id number]  PCP:  Erik Pao, MD  Cardiologist:  Erik Dawley, MD  Electrophysiologist:  None   Chief Complaint: ***  History of Present Illness:   Erik [name] is a 79 y.o. male with history of minimal CAD on cath in 2004, PVCs, PAF, NICM (variable EF over the years), chronic combined CHF, CKD stage III, DM, HTN, HL (followed by PCP), previous thrombocytopenia by labs, trigeminal neuralgia, suspected mild PAD who presents for follow-up.  He underwent cath in 2004 for EF 30-35% with minimal irregularities in the LAD otherwise no significant disease overall. EF was 35-40% in 2009, 55% in 2010, then back down to 35-40% in 2015 during admission for AF-RVR. At that time he spontaneously converted to NSR on IV diltiazem. Cardiac enzymes remained normal. He was placed on Xarelto. Echocardiogram demonstrated worsening LV function with an EF of 35-40% and inferior and inferolateral hypokinesis. Patient was set up for a nuclear study as an inpatient which demonstrated mid to apical anteroseptal perfusion defect suggestive of possible infarct but no ischemia, EF 48%. This was felt to be a low risk study. Decision was made to treat him medically. Last echo in 12/2014 showed EF 50-55%, hypokinesis of the basal mid inferior myocardium, grade 1 DD, mild MR, mild LAE. ACEI had previously been discontinued due to CKD and worsening renal function once reintroduced. He has history of trigeminal neuralgia as well (carbamazepime remotely discontinued due to interaction with Xarelto). He had abnormal noninvasive vascular testing in 08/2019 and was referred to [facility]. Dr. Carlis [name] felt there was no critical stenosis identified on his lower extremity arterial duplex although he does have evidence of atheresclerosis and mild PAD, so medical therapy, walking and avoidance of tobacco was recommended.   i met 2019  anticoag  dose  Paroxysmal atrial fibrillation Chronic combined CHF/NICM CKD stage III Essential HTN    Labwork independently reviewed: 07/2019 Cr 1.6, K 3.9, glucose 120, AST/ALT OK, Hgb 13.9, plt 117, LDL 62, trig 88, HDL 37   Past Medical History:  Diagnosis Date  . Chronic combined systolic and diastolic CHF (congestive heart failure) ([place])   . CKD (chronic kidney disease), stage III   . Coronary artery disease    a. Nonobstructive by cath 2004. b. Nuc 09/2013 - no ischemia, fixed defect suggestive of possible prior infarction, felt low risk, EF 48%.   . Diabetes mellitus   . GERD (gastroesophageal reflux disease)   . Hyperlipidemia   . Hypertension   . NICM (nonischemic cardiomyopathy) ([place])    a. EF 35-40% in 2009, 55% in 2010. b. 09/2013: 35-40% by echo and 48% by nuc. c. EF 50-55% by echo 12/2014.  [name] PAD (peripheral artery disease) ([facility])    a. mild by noninvasive testing.  [name] PAF (paroxysmal atrial fibrillation) ([place])   . 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 Premature ventricular contractions   . Thrombocytopenia ([place])   . Trigeminal neuralgia     Past Surgical History:  Procedure Laterality Date  . CARDIAC CATHETERIZATION  10/25/00, 12/29/02  . CHOLECYSTECTOMY N/A 01/25/2015   Procedure: LAPAROSCOPIC CHOLECYSTECTOMY ;  Surgeon: [name], MD;  Location: [place];  Service: General;  Laterality: N/A;  . SHOULDER SURGERY Left 2011   Shoulder surgery, rod inserted    Current Medications: No outpatient medications have been marked as taking for the 12/01/19 encounter (Appointment) with  Erik [name], Nedra Hai, PA-C.   ***   Allergies:   Claritin [loratadine] and Tradjenta [linagliptin]   Social History   Socioeconomic History  . Marital status: Married    Spouse name: Not on file  . Number of children: Not on file  . Years of education: Not on file  . Highest education level: Not on file  Occupational History  . Occupation: retired    Comment: from maintenance  Tobacco  Use  . Smoking status: Former Smoker    Packs/day: 0.50    Years: 10.00    Pack years: 5.00    Types: Cigarettes    Quit date: 01/21/1985    Years since quitting: 34.8  . Smokeless tobacco: Never Used  . Tobacco comment: quit 35 yrs ago  Vaping Use  . Vaping Use: Never used  Substance and Sexual Activity  . Alcohol use: No  . Drug use: No  . Sexual activity: Not Currently    Birth control/protection: None  Other Topics Concern  . Not on file  Social History Narrative   Lives with wife in a one story home.  Has 3 children.  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     Education: high school.   Social Determinants of Health   Financial Resource Strain:   . Difficulty of Paying Living Expenses:   Food Insecurity: No Food Insecurity  . Worried About Charity fundraiser in the Last Year: Never true  . Ran Out of Food in the Last Year: Never true  Transportation Needs:   . Lack of Transportation (Medical):   [name] Lack of Transportation (Non-Medical):   Physical Activity:   . Days of Exercise per Week:   . Minutes of Exercise per Session:   Stress:   . Feeling of Stress :   Social Connections:   . Frequency of Communication with Friends and Family:   . Frequency of Social Gatherings with Friends and Family:   . Attends Religious Services:   . Active Member of Clubs or Organizations:   . Attends Archivist Meetings:   [name] Marital Status:      Family History:  The patient's ***family history includes Diabetes in his brother and mother; Heart disease in his brother, father, and mother; Hypertension in his brother and mother. There is no history of Heart attack or Stroke.  ROS:   Please see the history of present illness. Otherwise, review of systems is positive for ***.  All other systems are reviewed and otherwise negative.    EKGs/Labs/Other Studies Reviewed:    Studies reviewed are outlined and summarized above. Reports included below if  pertinent.  2D echo 2016 Study Conclusions   - Left ventricle: The cavity size was normal. Systolic function was  normal. The estimated ejection fraction was in the range of 50%  to 55%. There is hypokinesis of the basal-midinferior myocardium.  Doppler parameters are consistent with abnormal left ventricular  relaxation (grade 1 diastolic dysfunction).  - Ventricular septum: Septal motion showed paradox.  - Mitral valve: Mildly calcified annulus. There was mild  regurgitation.  - Left atrium: The atrium was mildly dilated.   Impressions:   - Compared to the prior study, there has been no significant  interval change.     EKG:  EKG is ordered today, personally reviewed, demonstrating ***  Recent Labs: 02/09/2019: ALT 12; Hemoglobin 12.6; Platelets 133; TSH 2.280 05/11/2019: BUN 16; Creatinine, Ser 1.53; NT-Pro BNP 1,964; Potassium 4.2; Sodium 143  Recent  Lipid Panel No results found for: CHOL, TRIG, HDL, CHOLHDL, VLDL, LDLCALC, LDLDIRECT  PHYSICAL EXAM:    VS:  There were no vitals taken for this visit.  BMI: There is no height or weight on file to calculate BMI.  GEN: Well nourished, well developed, in no acute distress HEENT: normocephalic, atraumatic Neck: no JVD, carotid bruits, or masses Cardiac: ***RRR; no murmurs, rubs, or gallops, no edema  Respiratory:  clear to auscultation bilaterally, normal work of breathing GI: soft, nontender, nondistended, + BS MS: no deformity or atrophy Skin: warm and dry, no rash Neuro:  Alert and Oriented x 3, Strength and sensation are intact, follows commands Psych: euthymic mood, full affect  Wt Readings from Last 3 Encounters:  11/02/19 189 lb (85.7 kg)  09/01/19 202 lb 3.2 oz (91.7 kg)  05/13/19 204 lb 6.4 oz (92.7 kg)     ASSESSMENT & PLAN:   1. ***  Disposition: F/u with ***   Medication Adjustments/Labs and Tests Ordered: Current medicines are reviewed at length with the patient today.  Concerns  regarding medicines are outlined above. Medication changes, Labs and Tests ordered today are summarized above and listed in the Patient Instructions accessible in Encounters.   Signed, Charlie Pitter, PA-C  11/29/2019 11:46 AM    [address], [place], [place]  [address] Phone: [phone]; Fax: [phone]

## 2019-12-01 ENCOUNTER — Ambulatory Visit: Payer: HMO | Admitting: Physician Assistant

## 2019-12-20 DIAGNOSIS — H35371 Puckering of macula, right eye: Secondary | ICD-10-CM | POA: Diagnosis not present

## 2019-12-21 DIAGNOSIS — L570 Actinic keratosis: Secondary | ICD-10-CM | POA: Diagnosis not present

## 2019-12-21 DIAGNOSIS — L905 Scar conditions and fibrosis of skin: Secondary | ICD-10-CM | POA: Diagnosis not present

## 2019-12-21 DIAGNOSIS — Z85828 Personal history of other malignant neoplasm of skin: Secondary | ICD-10-CM | POA: Diagnosis not present

## 2019-12-28 DIAGNOSIS — H43822 Vitreomacular adhesion, left eye: Secondary | ICD-10-CM | POA: Diagnosis not present

## 2019-12-28 DIAGNOSIS — H33322 Round hole, left eye: Secondary | ICD-10-CM | POA: Diagnosis not present

## 2019-12-28 DIAGNOSIS — H31092 Other chorioretinal scars, left eye: Secondary | ICD-10-CM | POA: Diagnosis not present

## 2019-12-28 DIAGNOSIS — H35371 Puckering of macula, right eye: Secondary | ICD-10-CM | POA: Diagnosis not present

## 2020-01-03 NOTE — Progress Notes (Signed)
Cardiology Office Note    Date:  01/05/2020   ID:  Antrone, Walla 09/09/1940, MRN [id number]  PCP:  [name], MD  Cardiologist: Ena Dawley, MD EPS: None  No chief complaint on file.   History of Present Illness:  Erik [name] is a 79 y.o. male with a past medical history significant for no significant CAD on cath in 2004, PVCs, PAF, NICM (variable EF over the years EF35-40% in 2009, 55% in 2010, then back down to 35-40% in 2015 during admission for A. fib with RVR,(diltiazem stopped due to dizziness and bradycardia 01/2019)echo 12/2014 showed EF 50-55%, hypokinesis of the basal mid inferior myocardium, grade 1 diastolic dysfunction, mild MR, mild LAE ), chronic combined CHF, CKD stage III, DM, HTN, HL (followed by PCP).   Last saw Dr. [name] 08/2019 complaining of claudications-arterial dopplers normal.  Patient comes in for f/u. Denies chest pain, palpitations, dyspnea, edema, dizziness or presyncope. Does yard work but no regular exercise. Complains of hip pain with activity but no leg pain. Xarelto costing him over $300 for 60 days.  Past Medical History:  Diagnosis Date  . Chronic combined systolic and diastolic CHF (congestive heart failure) ([place])   . CKD (chronic kidney disease), stage III   . Coronary artery disease    a. Nonobstructive by cath 2004. b. Nuc 09/2013 - no ischemia, fixed defect suggestive of possible prior infarction, felt low risk, EF 48%.   . Diabetes mellitus   . GERD (gastroesophageal reflux disease)   . Hyperlipidemia   . Hypertension   . NICM (nonischemic cardiomyopathy) ([place])    a. EF 35-40% in 2009, 55% in 2010. b. 09/2013: 35-40% by echo and 48% by nuc. c. EF 50-55% by echo 12/2014.  [name] PAD (peripheral artery disease) (HCC)    a. mild by noninvasive testing.  [name] PAF (paroxysmal atrial fibrillation) ([place])   . 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 Premature ventricular contractions   . Thrombocytopenia ([place])   . Trigeminal  neuralgia     Past Surgical History:  Procedure Laterality Date  . CARDIAC CATHETERIZATION  10/25/00, 12/29/02  . CHOLECYSTECTOMY N/A 01/25/2015   Procedure: LAPAROSCOPIC CHOLECYSTECTOMY ;  Surgeon: [name], MD;  Location: [facility];  Service: General;  Laterality: N/A;  . SHOULDER SURGERY Left 2011   Shoulder surgery, rod inserted    Current Medications: Current Meds  Medication Sig  . acetaminophen (TYLENOL) 325 MG tablet Take 2 tablets (650 mg total) by mouth every 6 (six) hours as needed for mild pain (or Fever >/= 101).  [name] arginine 500 MG tablet Take 1,000 mg by mouth daily.  [name] atorvastatin (LIPITOR) 40 MG tablet Take 1 tablet (40 mg total) by mouth daily at 6 PM.  . BD PEN NEEDLE NANO U/F 32G X 4 MM MISC USE 1 NEEDLE WITH INSULIN INJECTION ONCE DAILY  . citalopram (CELEXA) 40 MG tablet Take 40 mg by mouth daily.  . clonazePAM (KLONOPIN) 0.5 MG tablet Take 0.5 mg by mouth daily as needed.   . Coenzyme Q10 (COQ10) 200 MG CAPS Take 100 mg by mouth daily.   . finasteride (PROSCAR) 5 MG tablet Take 5 mg by mouth once daily  . furosemide (LASIX) 40 MG tablet Take 1 tablet (40 mg total) by mouth as needed for fluid or edema.  . metoprolol tartrate (LOPRESSOR) 50 MG tablet TAKE 1 AND 1/2 TABLETS(75 MG) BY MOUTH TWICE DAILY  . moxifloxacin (VIGAMOX) 0.5 % ophthalmic  solution Place 1 drop into the left eye 4 (four) times daily.  . Multiple Vitamin (MULTIVITAMIN) capsule Take 1 capsule by mouth daily.  [name] omeprazole (PRILOSEC) 20 MG capsule Take 20 mg by mouth daily.  [name] PROLENSA 0.07 % SOLN   . Rivaroxaban (XARELTO) 15 MG TABS tablet TAKE 1 TABLET(15 MG) BY MOUTH DAILY WITH SUPPER  . selenium 200 MCG TABS tablet Take 200 mcg by mouth daily.  . tamsulosin (FLOMAX) 0.4 MG CAPS capsule Take 0.4 mg by mouth at bedtime.  [name] SOLOSTAR 300 UNIT/ML SOPN Inject 36 Units as directed daily.      Allergies:   Claritin [loratadine] and Tradjenta [linagliptin]   Social History    Socioeconomic History  . Marital status: Married    Spouse name: Not on file  . Number of children: Not on file  . Years of education: Not on file  . Highest education level: Not on file  Occupational History  . Occupation: retired    Comment: from maintenance  Tobacco Use  . Smoking status: Former Smoker    Packs/day: 0.50    Years: 10.00    Pack years: 5.00    Types: Cigarettes    Quit date: 01/21/1985    Years since quitting: 34.9  . Smokeless tobacco: Never Used  . Tobacco comment: quit 35 yrs ago  Vaping Use  . Vaping Use: Never used  Substance and Sexual Activity  . Alcohol use: No  . Drug use: No  . Sexual activity: Not Currently    Birth control/protection: None  Other Topics Concern  . Not on file  Social History Narrative   Lives with wife in a one story home.  Has 3 children.  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     Education: high school.   Social Determinants of Health   Financial Resource Strain:   . Difficulty of Paying Living Expenses:   Food Insecurity: No Food Insecurity  . Worried About Charity fundraiser in the Last Year: Never true  . Ran Out of Food in the Last Year: Never true  Transportation Needs:   . Lack of Transportation (Medical):   [name] Lack of Transportation (Non-Medical):   Physical Activity:   . Days of Exercise per Week:   . Minutes of Exercise per Session:   Stress:   . Feeling of Stress :   Social Connections:   . Frequency of Communication with Friends and Family:   . Frequency of Social Gatherings with Friends and Family:   . Attends Religious Services:   . Active Member of Clubs or Organizations:   . Attends Archivist Meetings:   [name] Marital Status:      Family History:  The patient's family history includes Diabetes in his brother and mother; Heart disease in his brother, father, and mother; Hypertension in his brother and mother.   ROS:   Please see the history of present illness.    ROS All  other systems reviewed and are negative.   PHYSICAL EXAM:   VS:  BP (!) 142/70   Pulse 63   Ht 5\' 10"  (1.778 m)   Wt 195 lb 12.8 oz (88.8 kg)   SpO2 93%   BMI 28.09 kg/m   Physical Exam  GEN: Well nourished, well developed, in no acute distress  Neck: no JVD, carotid bruits, or masses Cardiac:RRR; 1/6 systolic murmur LSB Respiratory:  clear to auscultation bilaterally, normal work of breathing GI: soft, nontender, nondistended, +  BS Ext: without cyanosis, clubbing, or edema, Good distal pulses bilaterally Neuro:  Alert and Oriented x 3 Psych: euthymic mood, full affect  Wt Readings from Last 3 Encounters:  01/05/20 195 lb 12.8 oz (88.8 kg)  11/02/19 189 lb (85.7 kg)  09/01/19 202 lb 3.2 oz (91.7 kg)      Studies/Labs Reviewed:   EKG:  EKG is not ordered today.  Recent Labs: 02/09/2019: ALT 12; Hemoglobin 12.6; Platelets 133; TSH 2.280 05/11/2019: BUN 16; Creatinine, Ser 1.53; NT-Pro BNP 1,964; Potassium 4.2; Sodium 143   Lipid Panel No results found for: CHOL, TRIG, HDL, CHOLHDL, VLDL, LDLCALC, LDLDIRECT  Additional studies/ records that were reviewed today include:  Arterial dopplers 09/09/19 Summary:  Right: Resting right ankle-brachial index is within normal range. No  evidence of significant right lower extremity arterial disease. The right  toe-brachial index is abnormal.   Left: Resting left ankle-brachial index is within normal range. No  evidence of significant left lower extremity arterial disease. The left  toe-brachial index is abnormal.   Echo 2016 Study Conclusions   - Left ventricle: The cavity size was normal. Systolic function was    normal. The estimated ejection fraction was in the range of 50%    to 55%. There is hypokinesis of the basal-midinferior myocardium.    Doppler parameters are consistent with abnormal left ventricular    relaxation (grade 1 diastolic dysfunction).  - Ventricular septum: Septal motion showed paradox.  - Mitral valve:  Mildly calcified annulus. There was mild    regurgitation.  - Left atrium: The atrium was mildly dilated.   Impressions:   - Compared to the prior study, there has been no significant    interval change.     ASSESSMENT:    1. Paroxysmal atrial fibrillation (HCC)   2. NICM (nonischemic cardiomyopathy) ([place])   3. Chronic combined systolic and diastolic CHF (congestive heart failure) (HCC)   4. Stage 3 chronic kidney disease, unspecified whether stage 3a or 3b CKD   5. Essential hypertension   6. Hyperlipidemia, unspecified hyperlipidemia type   7. Coronary artery disease due to lipid rich plaque      PLAN:  In order of problems listed above:  PAF on lower dose Xarelto-having trouble affording. Have given samples and patient assistant forms. No bleeding problems.Check labs today  NICM EF 50-55% 12/2014-on metoprolol  Chronic combined systolic and diastolic CHF compensated  CKD stage 3 Crt 1.6 07/2019-repeat today.  HTN BP controlled  HLD LDL at goal 07/2019 on lipitor  Nonobstructive CAD cath 2004    Medication Adjustments/Labs and Tests Ordered: Current medicines are reviewed at length with the patient today.  Concerns regarding medicines are outlined above.  Medication changes, Labs and Tests ordered today are listed in the Patient Instructions below. Patient Instructions  Medication Instructions:  Your physician recommends that you continue on your current medications as directed. Please refer to the Current Medication list given to you today.  *If you need a refill on your cardiac medications before your next appointment, please call your pharmacy*   Lab Work: TODAY: BMET, CBC  If you have labs (blood work) drawn today and your tests are completely normal, you will receive your results only by: [name] MyChart Message (if you have MyChart) OR . A paper copy in the mail If you have any lab test that is abnormal or we need to change your treatment, we will call you to  review the results.   Testing/Procedures: None   Follow-Up:  At [facility], you and your health needs are our priority.  As part of our continuing mission to provide you with exceptional heart care, we have created designated Provider Care Teams.  These Care Teams include your primary Cardiologist (physician) and Advanced Practice Providers (APPs -  Physician Assistants and Nurse Practitioners) who all work together to provide you with the care you need, when you need it.  We recommend signing up for the patient portal called "MyChart".  Sign up information is provided on this After Visit Summary.  MyChart is used to connect with patients for Virtual Visits (Telemedicine).  Patients are able to view lab/test results, encounter notes, upcoming appointments, etc.  Non-urgent messages can be sent to your provider as well.   To learn more about what you can do with MyChart, go to [email or web address].    Your next appointment:   05/08/2020  The format for your next appointment:   In Person  Provider:   Ena Dawley, MD   Other Instructions None     Signed, Ermalinda Barrios, PA-C  01/05/2020 8:24 AM    [address], [place], [place]  [id number] Phone: [phone]; Fax: [phone]

## 2020-01-05 ENCOUNTER — Other Ambulatory Visit: Payer: Self-pay

## 2020-01-05 ENCOUNTER — Ambulatory Visit: Payer: HMO | Admitting: Physician Assistant

## 2020-01-05 ENCOUNTER — Encounter: Payer: Self-pay | Admitting: Physician Assistant

## 2020-01-05 VITALS — BP 142/70 | HR 63 | Ht 70.0 in | Wt 195.8 lb

## 2020-01-05 DIAGNOSIS — I428 Other cardiomyopathies: Secondary | ICD-10-CM | POA: Diagnosis not present

## 2020-01-05 DIAGNOSIS — I2583 Coronary atherosclerosis due to lipid rich plaque: Secondary | ICD-10-CM

## 2020-01-05 DIAGNOSIS — I5042 Chronic combined systolic (congestive) and diastolic (congestive) heart failure: Secondary | ICD-10-CM

## 2020-01-05 DIAGNOSIS — E785 Hyperlipidemia, unspecified: Secondary | ICD-10-CM | POA: Diagnosis not present

## 2020-01-05 DIAGNOSIS — I2510 Atherosclerotic heart disease of native coronary artery without angina pectoris: Secondary | ICD-10-CM | POA: Diagnosis not present

## 2020-01-05 DIAGNOSIS — I10 Essential (primary) hypertension: Secondary | ICD-10-CM | POA: Diagnosis not present

## 2020-01-05 DIAGNOSIS — I480 Paroxysmal atrial fibrillation: Secondary | ICD-10-CM

## 2020-01-05 DIAGNOSIS — N183 Chronic kidney disease, stage 3 unspecified: Secondary | ICD-10-CM | POA: Diagnosis not present

## 2020-01-05 LAB — BASIC METABOLIC PANEL
BUN/Creatinine Ratio: 13 (ref 10–24)
BUN: 20 mg/dL (ref 8–27)
CO2: 26 mmol/L (ref 20–29)
Calcium: 9 mg/dL (ref 8.6–10.2)
Chloride: 104 mmol/L (ref 96–106)
Creatinine, Ser: 1.56 mg/dL — ABNORMAL HIGH (ref 0.76–1.27)
GFR calc Af Amer: 47 mL/min/{1.73_m2} — ABNORMAL LOW (ref >59)
GFR calc non Af Amer: 41 mL/min/{1.73_m2} — ABNORMAL LOW (ref >59)
Glucose: 192 mg/dL — ABNORMAL HIGH (ref 65–99)
Potassium: 4.2 mmol/L (ref 3.5–5.2)
Sodium: 141 mmol/L (ref 134–144)

## 2020-01-05 LAB — CBC
Hematocrit: 38.9 % (ref 37.5–51.0)
Hemoglobin: 13.3 g/dL (ref 13.0–17.7)
MCH: 31 pg (ref 26.6–33.0)
MCHC: 34.2 g/dL (ref 31.5–35.7)
MCV: 91 fL (ref 79–97)
Platelets: 113 10*3/uL — ABNORMAL LOW (ref 150–450)
RBC: 4.29 x10E6/uL (ref 4.14–5.80)
RDW: 12.9 % (ref 11.6–15.4)
WBC: 5.4 10*3/uL (ref 3.4–10.8)

## 2020-01-05 NOTE — Patient Instructions (Addendum)
Medication Instructions:  Your physician recommends that you continue on your current medications as directed. Please refer to the Current Medication list given to you today.  *If you need a refill on your cardiac medications before your next appointment, please call your pharmacy*   Lab Work: TODAY: BMET, CBC  If you have labs (blood work) drawn today and your tests are completely normal, you will receive your results only by: [name] MyChart Message (if you have MyChart) OR . A paper copy in the mail If you have any lab test that is abnormal or we need to change your treatment, we will call you to review the results.   Testing/Procedures: None   Follow-Up: At [facility], you and your health needs are our priority.  As part of our continuing mission to provide you with exceptional heart care, we have created designated Provider Care Teams.  These Care Teams include your primary Cardiologist (physician) and Advanced Practice Providers (APPs -  Physician Assistants and Nurse Practitioners) who all work together to provide you with the care you need, when you need it.  We recommend signing up for the patient portal called "MyChart".  Sign up information is provided on this After Visit Summary.  MyChart is used to connect with patients for Virtual Visits (Telemedicine).  Patients are able to view lab/test results, encounter notes, upcoming appointments, etc.  Non-urgent messages can be sent to your provider as well.   To learn more about what you can do with MyChart, go to [email or web address].    Your next appointment:   05/08/2020  The format for your next appointment:   In Person  Provider:   Ena Dawley, MD   Other Instructions None

## 2020-01-05 NOTE — Addendum Note (Signed)
Addended by: Carylon Perches on: 01/05/2020 08:31 AM   Modules accepted: Orders

## 2020-01-11 ENCOUNTER — Telehealth: Payer: Self-pay

## 2020-01-11 NOTE — Telephone Encounter (Signed)
**Note De-Identified Lavonya Hoerner Obfuscation** The pt left his completed [name] [employer] application for Xarelto at the office.  I have completed the provider page of the application and emailed it to Dr Thera Flake nurse so she can obtain her signature, date it and to fax all to [employer] at the fax number written on the cover letter included.

## 2020-01-11 NOTE — Telephone Encounter (Signed)
Forms printed off and placed in Dr. Francesca Oman folder to review and sign off on, upon return to the office on 8/25.  Once Dr. [name] signs forms, we will fax this to [employer] pt assistance thereafter.  Pt can be provided samples in the meantime, if he were to call and inquire about this.

## 2020-01-17 NOTE — Telephone Encounter (Signed)
Pt calling stating that he is almost out of his medicaiton Xarelto 15 mg tablets. I informed pt that I would leave him 3 bottles of samples of Xarelto 15 mg tablet at the front desk for him to pick up. I advised the pt that if he has any other problems, questions or concerns, to give our office a call. Pt verbalized understanding.

## 2020-01-19 NOTE — Telephone Encounter (Signed)
Dr. [name] is aware of these forms that need to be signed tomorrow, when she returns to the office.  Once signed, I will fax them to [employer] thereafter.

## 2020-01-20 NOTE — Telephone Encounter (Signed)
Dr. [name] signed this pts [employer] pt assistance form this morning. Will be giving this to medical records to fax accordingly to [employer], for pt to receive assistance with getting his Xarelto.

## 2020-02-10 NOTE — Telephone Encounter (Signed)
**Note De-Identified [name] Obfuscation** I gave the pt Erik [name] [employer]'s phone number so he can call to check the process of his application. He is aware to call Jeani Hawking back at [phone] if they need anything from [place] and that we will take care of it and that that we are leaving him 2 bottles of Xarelto 15 mg samples in the front office at Dr Francesca Oman office for him to pick up.  He thanked me for calling him back, for the samples we are giving him and states that he is calling [employer] today to check on his application.

## 2020-02-10 NOTE — Telephone Encounter (Signed)
Patient following up on getting his Xarelto. He states he has not heard anything back from [place] regarding this. Patient has one pill left for tonight.

## 2020-02-16 ENCOUNTER — Ambulatory Visit: Payer: HMO | Admitting: Podiatry

## 2020-02-16 ENCOUNTER — Other Ambulatory Visit: Payer: Self-pay

## 2020-02-16 ENCOUNTER — Other Ambulatory Visit: Payer: Self-pay | Admitting: *Deleted

## 2020-02-16 ENCOUNTER — Encounter: Payer: Self-pay | Admitting: Podiatry

## 2020-02-16 DIAGNOSIS — M216X1 Other acquired deformities of right foot: Secondary | ICD-10-CM | POA: Diagnosis not present

## 2020-02-16 DIAGNOSIS — M216X2 Other acquired deformities of left foot: Secondary | ICD-10-CM

## 2020-02-16 DIAGNOSIS — E1142 Type 2 diabetes mellitus with diabetic polyneuropathy: Secondary | ICD-10-CM | POA: Diagnosis not present

## 2020-02-16 DIAGNOSIS — M79674 Pain in right toe(s): Secondary | ICD-10-CM

## 2020-02-16 DIAGNOSIS — Q828 Other specified congenital malformations of skin: Secondary | ICD-10-CM | POA: Diagnosis not present

## 2020-02-16 DIAGNOSIS — N183 Chronic kidney disease, stage 3 unspecified: Secondary | ICD-10-CM

## 2020-02-16 DIAGNOSIS — B351 Tinea unguium: Secondary | ICD-10-CM | POA: Diagnosis not present

## 2020-02-16 DIAGNOSIS — I739 Peripheral vascular disease, unspecified: Secondary | ICD-10-CM | POA: Diagnosis not present

## 2020-02-16 NOTE — Progress Notes (Signed)
This patient returns to my office for at risk foot care.  This patient requires this care by a professional since this patient will be at risk due to having CKD, Diabetes and PAD.  Asymptomatic porokeratosis  B/L.This patient is unable to cut nails himself since the patient cannot reach his nails.These nails are painful walking and wearing shoes.  He says he has developed a painful callus on the outside bottom of his right foot. He says he has applied medication to this painful callus.  This patient presents for at risk foot care today.  General Appearance  Alert, conversant and in no acute stress.  Vascular  Dorsalis pedis and posterior tibial  pulses are palpable  bilaterally.  Capillary return is within normal limits  bilaterally. Temperature is within normal limits  bilaterally.  Neurologic  Senn-Weinstein monofilament wire test within normal limits  bilaterally. Muscle power within normal limits bilaterally.  Nails Thick disfigured discolored nails with subungual debris  from hallux to fifth toes bilaterally. No evidence of bacterial infection or drainage bilaterally.  Orthopedic  No limitations of motion  feet .  No crepitus or effusions noted.  No bony pathology or digital deformities noted. Plantar flexed fifth metatarsal  B/L.  Skin  normotropic skin with no porokeratosis noted bilaterally.  No signs of infections or ulcers noted.   Porokeratosis sub 5th met right foot inflamed.  Non painful callus sub 5th left foot.  Onychomycosis  Pain in right toes  Pain in left toes  Porokeratosis sub 5th met right foot.  Consent was obtained for treatment procedures.   Mechanical debridement of nails 1-5  bilaterally performed with a nail nipper.  Filed with dremel without incident. Debride callus with # 15 blade.  Told him to discontinue application of solution which appears to have caused a chemical burn.   Return office visit   3 months                   Told patient to return for periodic foot  care and evaluation due to potential at risk complications.   Gardiner Barefoot DPM

## 2020-02-16 NOTE — Patient Outreach (Signed)
  [facility]) Care Management Chronic Special Needs Program    02/16/2020  Name: Viet, Kemmerer: 02-Mar-1941  MRN: [id number]   Mr. Manny Vitolo is enrolled in a chronic special needs plan for Congestive Heart Failure.  Client is tier 2 and should be outreached within 6 months from last outreach 09/16/19.  Plan is updated to outreach client in 6 months (due October 2021)  Jacqlyn Larsen [employer], BSN [place], [place]

## 2020-02-21 ENCOUNTER — Ambulatory Visit: Payer: Self-pay | Admitting: *Deleted

## 2020-02-23 ENCOUNTER — Ambulatory Visit: Payer: Self-pay | Admitting: *Deleted

## 2020-02-25 ENCOUNTER — Other Ambulatory Visit: Payer: Self-pay | Admitting: *Deleted

## 2020-02-25 ENCOUNTER — Encounter: Payer: Self-pay | Admitting: *Deleted

## 2020-02-25 NOTE — Patient Outreach (Addendum)
[facility]) Care Management Chronic Special Needs Program  02/25/2020  Name: Erik [name] DOB: March 30, 1941  MRN: [id number]  Mr. Veron Senner is enrolled in a chronic special needs plan for Heart Failure. Reviewed and updated care plan.  Subjective: Client reports he is doing well, continues weighing every 3 days with weight steady at 190 pounds, continues checking CBG several times daily and states " the readings are all good"  Client is to follow up with nephrologist on 02/28/20.  Client reports he still has difficulty affording xarelto and states paperwork is in the process and " I think pharmacist called me"  Client states he took his last xarelto last night and is contacting doctor today for samples, client states he will call RN care manager back if any issues.  (See care plan, RN care manager contacted HTA pharmacist for update).  Goals Addressed              This Visit's Progress   .   Acknowledge receipt of Metallurgist previously mailed Advanced directives packet to client Please call if you need any assistance completing    .  "would love to get off some of these drugs if possible" (pt-stated)        RN care manager reviewed medications with client Pharmacist outreached client Call RN care manager if you have any questions about medications    .  Client understands the importance of follow-up with providers by attending scheduled visits        Client reports he continues attending all scheduled health care provider appointments Call and schedule a yearly physical and follow up visit as recommended by your health care provider.     .  Client verbalize knowledge of Heart Failure disease self management skills by discussing information with RN during telephone assessments.        Signs and symptoms of heart failure reviewed.  Client verbalizes understanding. Review [employer] calendar (in the back section)  sent in the mail for Heart failure information/ action plan. Read the "color" heart zones and know if you are in the red, yellow or green zones and be prepared to discuss with RN on each call. Call your provider if you feel you are getting in the "yellow" zone. Call 911 if you are in the "red" zone, unrelieved shortness of breath. It is important to weigh daily and write down weights.  Pay attention to your body if you have any shortness of breath, swelling in your feet, ankles, legs or your waistband gets tight. Call your provider if you gain 3 pounds overnight or 5 pounds in a week. Take your medications as prescribed, many will cause you to use the bathroom more. Follow a low salt meal plan, read food labels for the sodium (salt) content. Your provider may restrict or limit how much liquids you drink every day.  Increase exercise only if you are able to do it.  Follow doctor recommendations Remember to call 24 hour nurse advice line if needed at [phone]          .  Client will report no worsening of symptoms of Atrial Fibrillation within the next 12 months        Please review A-fib Action Plan in the back of [employer] calendar. Please report to your health care provider any worsening of symptoms such as racing or irregular heart beat that may be  uncomfortable, shortness of breath with or without chest pain, dizziness, or weakness. Call 911 for severe symptoms such as chest pain, fainting, struggling to breathe. Reviewed importance of taking anticoagulant (blood thinner) to prevent stroke. Increase exercise only if you are able to do it.  Follow doctor recommendations. Follow up with your cardiologist (heart doctor) for yearly visits. Please follow up with your health care provider as recommended. RN care manager provided EMMI education "Atrial Fibrillation"      .  Client will verbalize knowledge of diabetes self-management as evidenced by Hgb A1C <7 or as defined  by provider.        Your last documented AIC is 7.1 on 08/05/19.  Have your [facility] checked every 6 months if you are at goal or every 3 months if you are not at goal. Check blood sugars daily before eating with goal of 80-130.  You can also check 1 1/2 hours after eating with goal of 180 or less. Plan to eat low carbohydrate and low salt meals, watch portion sizes and avoid sugar sweetened drinks.  Discussed carbohydrate control meals. Reviewed signs and symptoms of hyperglycemia (high blood sugar) and hypoglycemia (low blood sugar) and actions to take. Review [employer] calendar (sent in the mail) for diabetes action plan in the back. Increase activity only if you are able to do it.  Follow doctor recommendations.         .  Client will verbalize knowledge of self management of Hypertension as evidences by BP reading of 140/90 or less; or as defined by provider        Plan to check blood pressure regularly.  If you do not have a B/P monitor (cuff), one can be provided to you.  Write results in your [employer] calendar (in the back section). Reviewed blood pressure medication from EMR. Take B/P medications as ordered.  Some may cause you to use the bathroom more. Plan to eat low salt and heart healthy meals full of fruits, vegetables, whole grains, lean protein and limit fat and sugars. Increase activity as tolerated. Reviewed lifestyle modification- smoking cessation, weight control and reducing stress. EMMI education article provided "High Blood Pressure in Adults" Review and plan to discuss with RN during next telephonic assessment.     .  Maintain timely refills of Heart Failure medication as prescribed within the year         Contact your RN care manager if you have questions about medicines. Medication review completed from EMR information. It is important to take your medications as prescribed. Reviewed use and possible side effects of medications Keep medications  refilled timely so you do not run out RN care manager left voicemail for HTA pharmacist Karrie Meres requesting call back for collaboration/ follow up about assistance for xarelto     .  Obtain annual  Lipid Profile, LDL-C        Per medical record review, Lipid profile completed on 08/05/19 LDL= 63 The goal for LDL is less than 70mg /dl as you are at high risk for complications. Try to avoid saturated fats, trans-fats and eat more fiber. Continue to follow up with your health care provider for any needed lab work.       .  Visit Primary Care Provider or Cardiologist at least 2 times per year        Client saw cardiologist 2 times in 2020 and twice in 2021 Continue to follow up with primary care provider or cardiologist for  visits and recommended lab work       Plan:    Consulting civil engineer faxed today's note with updated individualized care plan to primary care provider, mailed updated individualized care plan to client along with education materials.  Chronic care management coordinator will outreach in: Beginning of next week to follow up on xarelto, and in  6 months (per tier 2)   Kassie Mends Nursing/RN [place] Case Manager, C-SNP  [phone]  .

## 2020-02-25 NOTE — Telephone Encounter (Signed)
Pt called in requesting samples of Xarelto 15mg . He took his last one last night. He states that it has been over a month since he completed his Pt Asst paperwork. He called [employer] and he said the told him they had just received it and it was still being processed. He would like for our office to call to see if we could speed up the process for him.  Jeani Hawking, Could you please check on this for him? Thank You!

## 2020-02-25 NOTE — Telephone Encounter (Signed)
2 sample bottles left at front desk for pt.

## 2020-02-25 NOTE — Telephone Encounter (Addendum)
**Note De-Identified Edouard Gikas Obfuscation** I called [employer] and s/w Clarene Critchley who advised me that they just received the pts application on 1/61 (per our notes we faxed to them on 8/26 to [employer]).  Per Clarene Critchley they are behind is their processing as they have received a lot of applications lately and that the pts application is still in processing. She states that they will fax [place] and mail the pt a letter once their determination has been made.    The pts wife states that the pt is on his way to the office now to pick up Xarelto samples as someone from the office advised him they were giving him some.

## 2020-02-28 ENCOUNTER — Other Ambulatory Visit: Payer: Self-pay | Admitting: *Deleted

## 2020-02-28 DIAGNOSIS — E1122 Type 2 diabetes mellitus with diabetic chronic kidney disease: Secondary | ICD-10-CM | POA: Diagnosis not present

## 2020-02-28 DIAGNOSIS — N183 Chronic kidney disease, stage 3 unspecified: Secondary | ICD-10-CM | POA: Diagnosis not present

## 2020-02-28 DIAGNOSIS — N2581 Secondary hyperparathyroidism of renal origin: Secondary | ICD-10-CM | POA: Diagnosis not present

## 2020-02-28 DIAGNOSIS — D631 Anemia in chronic kidney disease: Secondary | ICD-10-CM | POA: Diagnosis not present

## 2020-02-28 DIAGNOSIS — I480 Paroxysmal atrial fibrillation: Secondary | ICD-10-CM | POA: Diagnosis not present

## 2020-02-28 DIAGNOSIS — I129 Hypertensive chronic kidney disease with stage 1 through stage 4 chronic kidney disease, or unspecified chronic kidney disease: Secondary | ICD-10-CM | POA: Diagnosis not present

## 2020-02-28 DIAGNOSIS — R809 Proteinuria, unspecified: Secondary | ICD-10-CM | POA: Diagnosis not present

## 2020-02-28 DIAGNOSIS — N189 Chronic kidney disease, unspecified: Secondary | ICD-10-CM | POA: Diagnosis not present

## 2020-02-28 NOTE — Patient Outreach (Signed)
  [facility]) Care Management Chronic Special Needs Program    02/28/2020  Name: [name], [name]: 01-26-1941  MRN: [id number]   [name] is enrolled in a chronic special needs plan for Congestive Heart Failure.  RN care manager spoke with client who reports he did get xarelto samples from his doctor and the PA at the practice has been helping him with the paperwork for assistance, client states he is currently waiting on [place] for an answer, client states he will continue communicating with his doctor to obtain samples.  RN care manager asked client to call for any further questions or issues with medication.  Jacqlyn Larsen [facility], BSN [place], [place]

## 2020-03-06 DIAGNOSIS — I509 Heart failure, unspecified: Secondary | ICD-10-CM | POA: Diagnosis not present

## 2020-03-06 DIAGNOSIS — I1310 Hypertensive heart and chronic kidney disease without heart failure, with stage 1 through stage 4 chronic kidney disease, or unspecified chronic kidney disease: Secondary | ICD-10-CM | POA: Diagnosis not present

## 2020-03-06 DIAGNOSIS — N183 Chronic kidney disease, stage 3 unspecified: Secondary | ICD-10-CM | POA: Diagnosis not present

## 2020-03-06 DIAGNOSIS — N1832 Chronic kidney disease, stage 3b: Secondary | ICD-10-CM | POA: Diagnosis not present

## 2020-03-06 DIAGNOSIS — D696 Thrombocytopenia, unspecified: Secondary | ICD-10-CM | POA: Diagnosis not present

## 2020-03-06 DIAGNOSIS — D692 Other nonthrombocytopenic purpura: Secondary | ICD-10-CM | POA: Diagnosis not present

## 2020-03-06 DIAGNOSIS — F411 Generalized anxiety disorder: Secondary | ICD-10-CM | POA: Diagnosis not present

## 2020-03-06 DIAGNOSIS — N401 Enlarged prostate with lower urinary tract symptoms: Secondary | ICD-10-CM | POA: Diagnosis not present

## 2020-03-06 DIAGNOSIS — R972 Elevated prostate specific antigen [PSA]: Secondary | ICD-10-CM | POA: Diagnosis not present

## 2020-03-06 DIAGNOSIS — Z23 Encounter for immunization: Secondary | ICD-10-CM | POA: Diagnosis not present

## 2020-03-06 DIAGNOSIS — E1129 Type 2 diabetes mellitus with other diabetic kidney complication: Secondary | ICD-10-CM | POA: Diagnosis not present

## 2020-03-06 DIAGNOSIS — D6869 Other thrombophilia: Secondary | ICD-10-CM | POA: Diagnosis not present

## 2020-03-06 DIAGNOSIS — E7800 Pure hypercholesterolemia, unspecified: Secondary | ICD-10-CM | POA: Diagnosis not present

## 2020-03-10 ENCOUNTER — Telehealth: Payer: Self-pay | Admitting: Cardiology

## 2020-03-10 NOTE — Telephone Encounter (Signed)
**Note De-Identified Neftali Abair Obfuscation** I called [employer] and s/w Jerene Pitch who advised me that the pts application is still in progressing. I asked if she knew how much longer processing will take because someone at [employer] told me on 4/00 that his application was in processing and that we would get their determination shortly but have not.  Brook states that she is sending his application to a processor now and that we will receive a fax within 24 to 48 hours and the pt will receive a letter in the mail.  I have advised the pts wife to call [employer] starting on Monday (I did give her their phone number) and everyday following until  [employer] tells them the determination and if this has not been resolved by next Friday to call Jeani Hawking back at [phone] and I will check into it again and we will provide the pt samples of Xarelto if he runs low while waiting on this determination from [employer] pt asst foundation.  She thanked me for calling them back.

## 2020-03-10 NOTE — Telephone Encounter (Signed)
Follow Up:      Pt said it have been over a month and he still have not heard from anybody about his Xarelto. He said he had called the company, still no Xarelto.

## 2020-03-17 NOTE — Telephone Encounter (Signed)
**Note De-Identified Byanca Kasper Obfuscation** I called [employer] pt asst [employer] as was advised by Roselyn Reef that the pts application is still in processing. I asked her to look over it with me as this has been going on since 8/27 and she did. We determined that the pts applications is complete and she promises me that we will receive a determination fax with in 24 to 48 hours.  I called the pt and he states that they told him 7 to 10 business days before a determination would be made. I advised him that if we have not heard anything from [employer] by the end of the day Monday to call me back and I will call them again. He verbalized understanding and thanked me for helping him with this.  I asked if he needs samples of Xarelto and he states that Dr. Osborne Casco gave him some and he is ok for now.

## 2020-03-17 NOTE — Telephone Encounter (Signed)
Patient calling back to follow up.

## 2020-04-14 ENCOUNTER — Other Ambulatory Visit: Payer: Self-pay | Admitting: *Deleted

## 2020-04-14 NOTE — Patient Outreach (Signed)
  [name]) Care Management Chronic Special Needs Program    04/14/2020  Name: [name], [name]: December 21, 1940  MRN: [id number]   [name] is enrolled in a chronic special needs plan for Congestive Heart Failure. [employer] care management team has assumed care and services for this member.  Case closed by [facility] care management.  Jacqlyn Larsen [facility], BSN [place], [place]

## 2020-05-02 ENCOUNTER — Telehealth: Payer: Self-pay | Admitting: Cardiology

## 2020-05-02 NOTE — Telephone Encounter (Signed)
Pt states he is following up regarding patient assistance. He has not heard any updates and is almost out of his supply. 3 weeks of 15mg  dose sample packs were left at the front desk for him. He will pick them up today. This should get him through the end of the month.   Will forward to Dr. Francesca Oman nurse to follow up with him.

## 2020-05-02 NOTE — Telephone Encounter (Signed)
Pt c/o medication issue:  1. Name of Medication: Rivaroxaban (XARELTO) 15 MG TABS tablet  2. How are you currently taking this medication (dosage and times per day)? As directed  3. Are you having a reaction (difficulty breathing--STAT)? no  4. What is your medication issue? Patient following up on [employer] patient assistance application for Xarelto. He states he only has 7 tablets left of his sample bottle and cannot afford to pay for this medication. Please advise.

## 2020-05-05 NOTE — Telephone Encounter (Signed)
I called [employer] to follow up on his Pt Asst application. It still has not been processed due to them being behind on processing applications. Since it was sent in on 01/2020 I was told that the provider signature was outdated and they would need a new provider page. I ask if the pt would have to complete a new application in January for the next year and was told that he would have to. I advised them that we had given the pt 3 weeks of samples to last him until the end of the month and I would just have the pt start a new application for the upcoming year so they can hopefully get that one processed quickly. I spoke with Lavet and she advised me to call them when the new application is faxed in and she would make sure it gets processed quickly. She apologized for the inconvenience.  I will call the pt and update him on this information and help him get started with the new application.

## 2020-05-08 ENCOUNTER — Ambulatory Visit: Payer: HMO | Admitting: Cardiology

## 2020-05-08 ENCOUNTER — Encounter: Payer: Self-pay | Admitting: Cardiology

## 2020-05-08 ENCOUNTER — Other Ambulatory Visit: Payer: Self-pay

## 2020-05-08 VITALS — BP 126/64 | HR 68 | Ht 70.0 in | Wt 197.6 lb

## 2020-05-08 DIAGNOSIS — N183 Chronic kidney disease, stage 3 unspecified: Secondary | ICD-10-CM | POA: Diagnosis not present

## 2020-05-08 DIAGNOSIS — I10 Essential (primary) hypertension: Secondary | ICD-10-CM | POA: Diagnosis not present

## 2020-05-08 DIAGNOSIS — I428 Other cardiomyopathies: Secondary | ICD-10-CM

## 2020-05-08 DIAGNOSIS — E782 Mixed hyperlipidemia: Secondary | ICD-10-CM | POA: Diagnosis not present

## 2020-05-08 DIAGNOSIS — I480 Paroxysmal atrial fibrillation: Secondary | ICD-10-CM

## 2020-05-08 NOTE — Patient Instructions (Signed)

## 2020-05-08 NOTE — Progress Notes (Signed)
Cardiology Office Note    Date:  05/08/2020   ID:  Marquail, Bradwell [birth year], MRN [id number]  PCP:  [name], MD  Cardiologist: Ena Dawley, MD EPS: None  Reason for visit: Follow-up for paroxysmal atrial fibrillation and lower extremity edema.  History of Present Illness:  Erik [name] is a 79 y.o. male with a past medical history significant for no significant CAD on cath in 2004, PVCs, PAF, NICM (variable EF over the years EF35-40% in 2009, 55% in 2010, then back down to 35-40% in 2015 during admission for A. fib with RVR,(diltiazem stopped due to dizziness and bradycardia 01/2019)echo 12/2014 showed EF 50-55%, hypokinesis of the basal mid inferior myocardium, grade 1 diastolic dysfunction, mild MR, mild LAE ), chronic combined CHF, CKD stage III, DM, HTN, HL (followed by PCP).   Last saw Dr. [name] 08/2019 complaining of claudications-arterial dopplers normal.  The patient is coming after 6 months, he is very tearful today as he just lost his son to cancer.  He has been having minimal lower extremity edema, no orthopnea proximal nocturnal dyspnea.  No palpitation dizziness or syncope.  He still has hard time getting Xarelto and is awaiting assistance.  He denies any bleeding or myalgias.  Past Medical History:  Diagnosis Date  . Chronic combined systolic and diastolic CHF (congestive heart failure) ([place])   . CKD (chronic kidney disease), stage III ([place])   . Coronary artery disease    a. Nonobstructive by cath 2004. b. Nuc 09/2013 - no ischemia, fixed defect suggestive of possible prior infarction, felt low risk, EF 48%.   . Diabetes mellitus   . GERD (gastroesophageal reflux disease)   . Hyperlipidemia   . Hypertension   . NICM (nonischemic cardiomyopathy) ([place])    a. EF 35-40% in 2009, 55% in 2010. b. 09/2013: 35-40% by echo and 48% by nuc. c. EF 50-55% by echo 12/2014.  [name] PAD (peripheral artery disease) (HCC)    a. mild by noninvasive testing.  [name] PAF  (paroxysmal atrial fibrillation) ([place])   . 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 Premature ventricular contractions   . Thrombocytopenia ([place])   . Trigeminal neuralgia     Past Surgical History:  Procedure Laterality Date  . CARDIAC CATHETERIZATION  10/25/00, 12/29/02  . CHOLECYSTECTOMY N/A 01/25/2015   Procedure: LAPAROSCOPIC CHOLECYSTECTOMY ;  Surgeon: [name], MD;  Location: [place];  Service: General;  Laterality: N/A;  . SHOULDER SURGERY Left 2011   Shoulder surgery, rod inserted    Current Medications: Current Meds  Medication Sig  . acetaminophen (TYLENOL) 325 MG tablet Take 2 tablets (650 mg total) by mouth every 6 (six) hours as needed for mild pain (or Fever >/= 101).  [name] arginine 500 MG tablet Take 1,000 mg by mouth daily.  [name] atorvastatin (LIPITOR) 40 MG tablet Take 1 tablet (40 mg total) by mouth daily at 6 PM.  . BD PEN NEEDLE NANO U/F 32G X 4 MM MISC USE 1 NEEDLE WITH INSULIN INJECTION ONCE DAILY  . citalopram (CELEXA) 40 MG tablet Take 40 mg by mouth daily.  . clonazePAM (KLONOPIN) 0.5 MG tablet Take 0.5 mg by mouth daily as needed.   . Coenzyme Q10 (COQ10) 200 MG CAPS Take 100 mg by mouth daily.   . finasteride (PROSCAR) 5 MG tablet Take 5 mg by mouth once daily  . furosemide (LASIX) 40 MG tablet Take 1 tablet (40 mg total) by mouth as needed for fluid  or edema.  . metoprolol tartrate (LOPRESSOR) 50 MG tablet TAKE 1 AND 1/2 TABLETS(75 MG) BY MOUTH TWICE DAILY  . moxifloxacin (VIGAMOX) 0.5 % ophthalmic solution Place 1 drop into the left eye 4 (four) times daily.  . Multiple Vitamin (MULTIVITAMIN) capsule Take 1 capsule by mouth daily.  [name] omeprazole (PRILOSEC) 20 MG capsule Take 20 mg by mouth daily.  [name] PROLENSA 0.07 % SOLN   . Rivaroxaban (XARELTO) 15 MG TABS tablet TAKE 1 TABLET(15 MG) BY MOUTH DAILY WITH SUPPER  . selenium 200 MCG TABS tablet Take 200 mcg by mouth daily.  . tamsulosin (FLOMAX) 0.4 MG CAPS capsule Take 0.4 mg by mouth at bedtime.  [name]  SOLOSTAR 300 UNIT/ML SOPN Inject 36 Units as directed daily.      Allergies:   Claritin [loratadine] and Tradjenta [linagliptin]   Social History   Socioeconomic History  . Marital status: Married    Spouse name: Not on file  . Number of children: Not on file  . Years of education: Not on file  . Highest education level: Not on file  Occupational History  . Occupation: retired    Comment: from maintenance  Tobacco Use  . Smoking status: Former Smoker    Packs/day: 0.50    Years: 10.00    Pack years: 5.00    Types: Cigarettes    Quit date: 01/21/1985    Years since quitting: 35.3  . Smokeless tobacco: Never Used  . Tobacco comment: quit 35 yrs ago  Vaping Use  . Vaping Use: Never used  Substance and Sexual Activity  . Alcohol use: No  . Drug use: No  . Sexual activity: Not Currently    Birth control/protection: None  Other Topics Concern  . Not on file  Social History Narrative   Lives with wife in a one story home.  Has 3 children.  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     Education: high school.   Social Determinants of Health   Financial Resource Strain: Not on file  Food Insecurity: No Food Insecurity  . Worried About Charity fundraiser in the Last Year: Never true  . Ran Out of Food in the Last Year: Never true  Transportation Needs: Not on file  Physical Activity: Not on file  Stress: Not on file  Social Connections: Not on file     Family History:  The patient's family history includes Diabetes in his brother and mother; Heart disease in his brother, father, and mother; Hypertension in his brother and mother.   ROS:   Please see the history of present illness.    ROS All other systems reviewed and are negative.   PHYSICAL EXAM:   VS:  BP 126/64   Pulse 68   Ht 5\' 10"  (1.778 m)   Wt 197 lb 9.6 oz (89.6 kg)   SpO2 95%   BMI 28.35 kg/m   Physical Exam  GEN: Well nourished, well developed, in no acute distress  Neck: no JVD, carotid  bruits, or masses Cardiac:RRR; 1/6 systolic murmur LSB Respiratory:  clear to auscultation bilaterally, normal work of breathing GI: soft, nontender, nondistended, + BS Ext: without cyanosis, clubbing, or edema, Good distal pulses bilaterally Neuro:  Alert and Oriented x 3 Psych: euthymic mood, full affect  Wt Readings from Last 3 Encounters:  05/08/20 197 lb 9.6 oz (89.6 kg)  01/05/20 195 lb 12.8 oz (88.8 kg)  11/02/19 189 lb (85.7 kg)      Studies/Labs  Reviewed:   EKG:  EKG is ordered today.  It shows sinus rhythm, 64 bpm, nonspecific ST-T wave abnormalities unchanged from prior.  Recent Labs: 05/11/2019: NT-Pro BNP 1,964 01/05/2020: BUN 20; Creatinine, Ser 1.56; Hemoglobin 13.3; Platelets 113; Potassium 4.2; Sodium 141   Lipid Panel No results found for: CHOL, TRIG, HDL, CHOLHDL, VLDL, LDLCALC, LDLDIRECT  Additional studies/ records that were reviewed today include:  Arterial dopplers 09/09/19 Summary:  Right: Resting right ankle-brachial index is within normal range. No  evidence of significant right lower extremity arterial disease. The right  toe-brachial index is abnormal.   Left: Resting left ankle-brachial index is within normal range. No  evidence of significant left lower extremity arterial disease. The left  toe-brachial index is abnormal.   Echo 2016 Study Conclusions   - Left ventricle: The cavity size was normal. Systolic function was    normal. The estimated ejection fraction was in the range of 50%    to 55%. There is hypokinesis of the basal-midinferior myocardium.    Doppler parameters are consistent with abnormal left ventricular    relaxation (grade 1 diastolic dysfunction).  - Ventricular septum: Septal motion showed paradox.  - Mitral valve: Mildly calcified annulus. There was mild    regurgitation.  - Left atrium: The atrium was mildly dilated.   Impressions:   - Compared to the prior study, there has been no significant    interval change.      ASSESSMENT:    1. Paroxysmal atrial fibrillation (HCC)   2. NICM (nonischemic cardiomyopathy) ([place])   3. Essential hypertension   4. Stage 3 chronic kidney disease, unspecified whether stage 3a or 3b CKD ([place])   5. Mixed hyperlipidemia      PLAN:  In order of problems listed above:  PAF in sinus rhythm today, on lower dose Xarelto-having trouble affording, Jeani Hawking [address]  working on assistance program with [employer]. Have given samples and patient assistant forms. No bleeding, normal hemoglobin in August 2021.  NICM EF 50-55% 12/2014-on metoprolol  Chronic combined systolic and diastolic CHF compensated, Lasix as needed.  CKD stage 3 Crt 1.5 in August 2021.[name]  HTN BP controlled, no orthostatic hypotension  HLD LDL at goal 07/2019 on lipitor 40 mg daily that is tolerated well.  Nonobstructive CAD cath 2004  Medication Adjustments/Labs and Tests Ordered: Current medicines are reviewed at length with the patient today.  Concerns regarding medicines are outlined above.  Medication changes, Labs and Tests ordered today are listed in the Patient Instructions below. Patient Instructions  Medication Instructions:   Your physician recommends that you continue on your current medications as directed. Please refer to the Current Medication list given to you today.  *If you need a refill on your cardiac medications before your next appointment, please call your pharmacy*   Follow-Up: At [facility], you and your health needs are our priority.  As part of our continuing mission to provide you with exceptional heart care, we have created designated Provider Care Teams.  These Care Teams include your primary Cardiologist (physician) and Advanced Practice Providers (APPs -  Physician Assistants and Nurse Practitioners) who all work together to provide you with the care you need, when you need it.  We recommend signing up for the patient portal called "MyChart".  Sign up information  is provided on this After Visit Summary.  MyChart is used to connect with patients for Virtual Visits (Telemedicine).  Patients are able to view lab/test results, encounter notes, upcoming appointments, etc.  Non-urgent messages can be sent to your provider as well.   To learn more about what you can do with MyChart, go to [email or web address].    Your next appointment:   6 month(s)  The format for your next appointment:   In Person  Provider:   Ena Dawley, MD        Signed, Ena Dawley, MD  05/08/2020 9:02 AM    [facility] [address], [place], [place]  [address] Phone: [phone]; Fax: [phone]

## 2020-05-10 ENCOUNTER — Encounter: Payer: Self-pay | Admitting: Podiatry

## 2020-05-10 ENCOUNTER — Other Ambulatory Visit: Payer: Self-pay

## 2020-05-10 ENCOUNTER — Ambulatory Visit: Payer: HMO | Admitting: Podiatry

## 2020-05-10 DIAGNOSIS — M216X2 Other acquired deformities of left foot: Secondary | ICD-10-CM

## 2020-05-10 DIAGNOSIS — M216X1 Other acquired deformities of right foot: Secondary | ICD-10-CM

## 2020-05-10 DIAGNOSIS — B351 Tinea unguium: Secondary | ICD-10-CM

## 2020-05-10 DIAGNOSIS — M79674 Pain in right toe(s): Secondary | ICD-10-CM | POA: Diagnosis not present

## 2020-05-10 DIAGNOSIS — Q828 Other specified congenital malformations of skin: Secondary | ICD-10-CM

## 2020-05-10 DIAGNOSIS — I739 Peripheral vascular disease, unspecified: Secondary | ICD-10-CM

## 2020-05-10 DIAGNOSIS — E1142 Type 2 diabetes mellitus with diabetic polyneuropathy: Secondary | ICD-10-CM

## 2020-05-10 DIAGNOSIS — N183 Chronic kidney disease, stage 3 unspecified: Secondary | ICD-10-CM

## 2020-05-10 NOTE — Progress Notes (Signed)
This patient returns to my office for at risk foot care.  This patient requires this care by a professional since this patient will be at risk due to having CKD, Coagulation defect Diabetes and PAD. Patient is taking xarelto. Asymptomatic porokeratosis  B/L.This patient is unable to cut nails himself since the patient cannot reach his nails.These nails are painful walking and wearing shoes.  He says he has developed a painful callus on the outside bottom of his right foot.   This patient presents for at risk foot care today.  General Appearance  Alert, conversant and in no acute stress.  Vascular  Dorsalis pedis and posterior tibial  pulses are palpable  bilaterally.  Capillary return is within normal limits  bilaterally. Temperature is within normal limits  bilaterally.  Neurologic  Senn-Weinstein monofilament wire test within normal limits  bilaterally. Muscle power within normal limits bilaterally.  Nails Thick disfigured discolored nails with subungual debris  from hallux to fifth toes bilaterally. No evidence of bacterial infection or drainage bilaterally.  Orthopedic  No limitations of motion  feet .  No crepitus or effusions noted.  No bony pathology or digital deformities noted. Plantar flexed fifth metatarsal  B/L.  Skin  normotropic skin with no porokeratosis noted bilaterally.  No signs of infections or ulcers noted.   Porokeratosis sub 5th met right foot inflamed.  Non painful callus sub 5th left foot.  Onychomycosis  Pain in right toes  Pain in left toes  Porokeratosis sub 5th met right foot.  Consent was obtained for treatment procedures.   Mechanical debridement of nails 1-5  bilaterally performed with a nail nipper.  Filed with dremel without incident. Debride callus with # 15 blade.  Padding was dispensed.   Return office visit   3 months                   Told patient to return for periodic foot care and evaluation due to potential at risk complications.   Gardiner Barefoot DPM

## 2020-05-12 NOTE — Telephone Encounter (Signed)
I called and spoke with pt regarding his Xarelto Application. He will come by today to pick up the application and bring it back as soon as it's completed. I will place the completed Provider page in Dr Thera Flake box for signature so that part will be done. I will call and speak with Lavet @ [employer] as soon as the application is faxed so she can get it processed quickly.

## 2020-05-17 ENCOUNTER — Telehealth: Payer: Self-pay | Admitting: Cardiology

## 2020-05-17 NOTE — Telephone Encounter (Signed)
Patient called to confirm that Dr. Meda [name] received his paperwork to get help with paying for his medication, he states that he dropped it off at the front desk at [employer].  Please advise.

## 2020-05-17 NOTE — Telephone Encounter (Signed)
I called the pt back and spoke with his Wife. I told her we had received the application and we are waiting for Dr [name] to sign her portion (she will be back on 1/3) and then we will get it faxed for him.

## 2020-05-25 NOTE — Telephone Encounter (Signed)
Completed, signed application has been faxed to [employer] Pt Asst.

## 2020-05-30 ENCOUNTER — Other Ambulatory Visit: Payer: Self-pay

## 2020-05-30 ENCOUNTER — Telehealth: Payer: Self-pay | Admitting: Cardiology

## 2020-05-30 DIAGNOSIS — I10 Essential (primary) hypertension: Secondary | ICD-10-CM

## 2020-05-30 DIAGNOSIS — I480 Paroxysmal atrial fibrillation: Secondary | ICD-10-CM

## 2020-05-30 DIAGNOSIS — I5020 Unspecified systolic (congestive) heart failure: Secondary | ICD-10-CM

## 2020-05-30 MED ORDER — RIVAROXABAN 15 MG PO TABS: ORAL_TABLET | ORAL | 1 refills | Status: DC

## 2020-05-30 NOTE — Telephone Encounter (Signed)
Hey Lynn, LPN, can you please advise on this matter? Thanks  ?

## 2020-05-30 NOTE — Telephone Encounter (Addendum)
**Note De-Identified Diesel Lina Obfuscation** I called [name] and [name] and s/w Margaretha Sheffield concerning the pts application. She states that the pt did not answer the question of how many people live in his household and that Dr Thera Flake signature is "questionable". She is faxing the entire application to [phone] Attn: Jeani Hawking so I can look over the application and make corrections if needed.  I called the pt and we had a long discussion concerning him affording his Xarelto since this is a new year and his ins coverage has re-started. He is aware that he has a unmet deductible and that once he meets that his cost will come down to normal until he falls into the donut hole around mid year and that he can re-apply for asst through [place] then.  He is also aware that I am still working on his application from last year (2021) and that if we handle it by 06/26/2020 and he is approved he will receive a 90 day supply of Xarelto free of charge from [employer].  He did verify that 2 people live in his household. Per his request I did e-scribe his Xarelto to [facility] to fill #90 with 1 refill.

## 2020-05-30 NOTE — Telephone Encounter (Signed)
Patient calling the office for samples of medication:   1.  What medication and dosage are you requesting samples for? Xarelto  2.  Are you currently out of this medication? 3 pills left

## 2020-06-05 DIAGNOSIS — I480 Paroxysmal atrial fibrillation: Secondary | ICD-10-CM | POA: Diagnosis not present

## 2020-06-05 DIAGNOSIS — R0981 Nasal congestion: Secondary | ICD-10-CM | POA: Diagnosis not present

## 2020-06-05 DIAGNOSIS — Z1152 Encounter for screening for COVID-19: Secondary | ICD-10-CM | POA: Diagnosis not present

## 2020-06-05 DIAGNOSIS — Z7901 Long term (current) use of anticoagulants: Secondary | ICD-10-CM | POA: Diagnosis not present

## 2020-06-05 DIAGNOSIS — E1129 Type 2 diabetes mellitus with other diabetic kidney complication: Secondary | ICD-10-CM | POA: Diagnosis not present

## 2020-06-21 DIAGNOSIS — H35371 Puckering of macula, right eye: Secondary | ICD-10-CM | POA: Diagnosis not present

## 2020-06-21 DIAGNOSIS — H524 Presbyopia: Secondary | ICD-10-CM | POA: Diagnosis not present

## 2020-06-21 DIAGNOSIS — E119 Type 2 diabetes mellitus without complications: Secondary | ICD-10-CM | POA: Diagnosis not present

## 2020-06-22 DIAGNOSIS — D1801 Hemangioma of skin and subcutaneous tissue: Secondary | ICD-10-CM | POA: Diagnosis not present

## 2020-06-22 DIAGNOSIS — D0462 Carcinoma in situ of skin of left upper limb, including shoulder: Secondary | ICD-10-CM | POA: Diagnosis not present

## 2020-06-22 DIAGNOSIS — L570 Actinic keratosis: Secondary | ICD-10-CM | POA: Diagnosis not present

## 2020-06-22 DIAGNOSIS — L821 Other seborrheic keratosis: Secondary | ICD-10-CM | POA: Diagnosis not present

## 2020-06-22 DIAGNOSIS — D225 Melanocytic nevi of trunk: Secondary | ICD-10-CM | POA: Diagnosis not present

## 2020-06-26 ENCOUNTER — Telehealth: Payer: Self-pay | Admitting: Cardiology

## 2020-06-26 MED ORDER — METOPROLOL TARTRATE 50 MG PO TABS: 75.0000 mg | ORAL_TABLET | Freq: Two times a day (BID) | ORAL | 3 refills | Status: DC

## 2020-06-26 NOTE — Telephone Encounter (Signed)
° ° ° ° °*  STAT* If patient is at the pharmacy, call can be transferred to refill team.   1. Which medications need to be refilled? (please list name of each medication and dose if known)   metoprolol tartrate (LOPRESSOR) 50 MG tablet    2. Which pharmacy/location (including street and city if local pharmacy) is medication to be sent to? [place], [address] [place]  3. Do they need a 30 day or 90 day supply? 90 days

## 2020-06-26 NOTE — Telephone Encounter (Signed)
Pt's medication was sent to pt's pharmacy as requested. Confirmation received.  °

## 2020-06-27 ENCOUNTER — Other Ambulatory Visit: Payer: Self-pay | Admitting: Cardiology

## 2020-06-27 DIAGNOSIS — H33322 Round hole, left eye: Secondary | ICD-10-CM | POA: Diagnosis not present

## 2020-06-27 DIAGNOSIS — E113293 Type 2 diabetes mellitus with mild nonproliferative diabetic retinopathy without macular edema, bilateral: Secondary | ICD-10-CM | POA: Diagnosis not present

## 2020-06-27 DIAGNOSIS — H35371 Puckering of macula, right eye: Secondary | ICD-10-CM | POA: Diagnosis not present

## 2020-06-27 DIAGNOSIS — H43813 Vitreous degeneration, bilateral: Secondary | ICD-10-CM | POA: Diagnosis not present

## 2020-07-18 DIAGNOSIS — Q631 Lobulated, fused and horseshoe kidney: Secondary | ICD-10-CM | POA: Diagnosis not present

## 2020-07-18 DIAGNOSIS — E785 Hyperlipidemia, unspecified: Secondary | ICD-10-CM | POA: Diagnosis not present

## 2020-07-18 DIAGNOSIS — E1122 Type 2 diabetes mellitus with diabetic chronic kidney disease: Secondary | ICD-10-CM | POA: Diagnosis not present

## 2020-07-18 DIAGNOSIS — N183 Chronic kidney disease, stage 3 unspecified: Secondary | ICD-10-CM | POA: Diagnosis not present

## 2020-07-18 DIAGNOSIS — R809 Proteinuria, unspecified: Secondary | ICD-10-CM | POA: Diagnosis not present

## 2020-07-18 DIAGNOSIS — I2510 Atherosclerotic heart disease of native coronary artery without angina pectoris: Secondary | ICD-10-CM | POA: Diagnosis not present

## 2020-07-18 DIAGNOSIS — I129 Hypertensive chronic kidney disease with stage 1 through stage 4 chronic kidney disease, or unspecified chronic kidney disease: Secondary | ICD-10-CM | POA: Diagnosis not present

## 2020-08-09 ENCOUNTER — Ambulatory Visit: Payer: HMO | Admitting: *Deleted

## 2020-08-11 DIAGNOSIS — E1129 Type 2 diabetes mellitus with other diabetic kidney complication: Secondary | ICD-10-CM | POA: Diagnosis not present

## 2020-08-11 DIAGNOSIS — E7800 Pure hypercholesterolemia, unspecified: Secondary | ICD-10-CM | POA: Diagnosis not present

## 2020-08-11 DIAGNOSIS — Z125 Encounter for screening for malignant neoplasm of prostate: Secondary | ICD-10-CM | POA: Diagnosis not present

## 2020-08-16 ENCOUNTER — Other Ambulatory Visit: Payer: Self-pay

## 2020-08-16 ENCOUNTER — Encounter: Payer: Self-pay | Admitting: Podiatry

## 2020-08-16 ENCOUNTER — Ambulatory Visit: Payer: HMO | Admitting: Podiatry

## 2020-08-16 DIAGNOSIS — M79674 Pain in right toe(s): Secondary | ICD-10-CM | POA: Diagnosis not present

## 2020-08-16 DIAGNOSIS — N183 Chronic kidney disease, stage 3 unspecified: Secondary | ICD-10-CM

## 2020-08-16 DIAGNOSIS — B351 Tinea unguium: Secondary | ICD-10-CM | POA: Diagnosis not present

## 2020-08-16 DIAGNOSIS — E1142 Type 2 diabetes mellitus with diabetic polyneuropathy: Secondary | ICD-10-CM

## 2020-08-16 DIAGNOSIS — Z0000 Encounter for general adult medical examination without abnormal findings: Secondary | ICD-10-CM | POA: Insufficient documentation

## 2020-08-16 DIAGNOSIS — M216X2 Other acquired deformities of left foot: Secondary | ICD-10-CM

## 2020-08-16 DIAGNOSIS — M216X1 Other acquired deformities of right foot: Secondary | ICD-10-CM

## 2020-08-16 DIAGNOSIS — I739 Peripheral vascular disease, unspecified: Secondary | ICD-10-CM

## 2020-08-16 DIAGNOSIS — Q828 Other specified congenital malformations of skin: Secondary | ICD-10-CM

## 2020-08-16 NOTE — Progress Notes (Addendum)
This patient returns to my office for at risk foot care.  This patient requires this care by a professional since this patient will be at risk due to having CKD, Coagulation defect Diabetes and PAD. Patient is taking xarelto. Symptomatic porokeratosis  B/L.This patient is unable to cut nails himself since the patient cannot reach his nails.These nails are painful walking and wearing shoes.  He says he has developed a painful callus on the outside bottom of both feet..   This patient presents for at risk foot care today.  General Appearance  Alert, conversant and in no acute stress.  Vascular  Dorsalis pedis and posterior tibial  pulses are palpable  bilaterally.  Capillary return is within normal limits  bilaterally. Temperature is within normal limits  bilaterally.  Neurologic  Senn-Weinstein monofilament wire test within normal limits  bilaterally. Muscle power within normal limits bilaterally.  Nails Thick disfigured discolored nails with subungual debris  from hallux to fifth toes bilaterally. No evidence of bacterial infection or drainage bilaterally.  Orthopedic  No limitations of motion  feet .  No crepitus or effusions noted.  No bony pathology or digital deformities noted. Plantar flexed fifth metatarsal  B/L.  Skin  normotropic skin with no porokeratosis noted bilaterally.  No signs of infections or ulcers noted.   Porokeratosis sub 5th met right foot inflamed.  painful callus sub 5th left foot.  Onychomycosis  Pain in right toes  Pain in left toes  Porokeratosis sub 5th met right foot.  Consent was obtained for treatment procedures.   Mechanical debridement of nails 1-5  bilaterally performed with a nail nipper.  Filed with dremel without incident. Debride callus with # 15 blade.  Padding was dispensed.   Return office visit   10 weeks                   Told patient to return for periodic foot care and evaluation due to potential at risk complications.   Gardiner Barefoot DPM

## 2020-08-18 DIAGNOSIS — D6869 Other thrombophilia: Secondary | ICD-10-CM | POA: Diagnosis not present

## 2020-08-18 DIAGNOSIS — Z0000 Encounter for general adult medical examination without abnormal findings: Secondary | ICD-10-CM | POA: Diagnosis not present

## 2020-08-18 DIAGNOSIS — I480 Paroxysmal atrial fibrillation: Secondary | ICD-10-CM | POA: Diagnosis not present

## 2020-08-18 DIAGNOSIS — L570 Actinic keratosis: Secondary | ICD-10-CM | POA: Diagnosis not present

## 2020-08-18 DIAGNOSIS — N1832 Chronic kidney disease, stage 3b: Secondary | ICD-10-CM | POA: Diagnosis not present

## 2020-08-18 DIAGNOSIS — Z1389 Encounter for screening for other disorder: Secondary | ICD-10-CM | POA: Diagnosis not present

## 2020-08-18 DIAGNOSIS — Z7901 Long term (current) use of anticoagulants: Secondary | ICD-10-CM | POA: Diagnosis not present

## 2020-08-18 DIAGNOSIS — Z1212 Encounter for screening for malignant neoplasm of rectum: Secondary | ICD-10-CM | POA: Diagnosis not present

## 2020-08-18 DIAGNOSIS — R82998 Other abnormal findings in urine: Secondary | ICD-10-CM | POA: Diagnosis not present

## 2020-08-18 DIAGNOSIS — E1129 Type 2 diabetes mellitus with other diabetic kidney complication: Secondary | ICD-10-CM | POA: Diagnosis not present

## 2020-08-18 DIAGNOSIS — D692 Other nonthrombocytopenic purpura: Secondary | ICD-10-CM | POA: Diagnosis not present

## 2020-08-18 DIAGNOSIS — Z1331 Encounter for screening for depression: Secondary | ICD-10-CM | POA: Diagnosis not present

## 2020-08-18 DIAGNOSIS — R972 Elevated prostate specific antigen [PSA]: Secondary | ICD-10-CM | POA: Diagnosis not present

## 2020-08-18 DIAGNOSIS — F411 Generalized anxiety disorder: Secondary | ICD-10-CM | POA: Diagnosis not present

## 2020-08-18 DIAGNOSIS — I509 Heart failure, unspecified: Secondary | ICD-10-CM | POA: Diagnosis not present

## 2020-08-18 DIAGNOSIS — I1310 Hypertensive heart and chronic kidney disease without heart failure, with stage 1 through stage 4 chronic kidney disease, or unspecified chronic kidney disease: Secondary | ICD-10-CM | POA: Diagnosis not present

## 2020-09-22 DIAGNOSIS — I1310 Hypertensive heart and chronic kidney disease without heart failure, with stage 1 through stage 4 chronic kidney disease, or unspecified chronic kidney disease: Secondary | ICD-10-CM | POA: Diagnosis not present

## 2020-09-22 DIAGNOSIS — K219 Gastro-esophageal reflux disease without esophagitis: Secondary | ICD-10-CM | POA: Diagnosis not present

## 2020-09-22 DIAGNOSIS — R0989 Other specified symptoms and signs involving the circulatory and respiratory systems: Secondary | ICD-10-CM | POA: Diagnosis not present

## 2020-09-22 DIAGNOSIS — I509 Heart failure, unspecified: Secondary | ICD-10-CM | POA: Diagnosis not present

## 2020-09-22 DIAGNOSIS — I480 Paroxysmal atrial fibrillation: Secondary | ICD-10-CM | POA: Diagnosis not present

## 2020-09-22 DIAGNOSIS — N1832 Chronic kidney disease, stage 3b: Secondary | ICD-10-CM | POA: Diagnosis not present

## 2020-09-22 DIAGNOSIS — Z7901 Long term (current) use of anticoagulants: Secondary | ICD-10-CM | POA: Diagnosis not present

## 2020-10-03 ENCOUNTER — Other Ambulatory Visit: Payer: Self-pay | Admitting: Gastroenterology

## 2020-10-03 ENCOUNTER — Ambulatory Visit
Admission: RE | Admit: 2020-10-03 | Discharge: 2020-10-03 | Disposition: A | Discharge status: Home / Self Care | Payer: HMO | Source: Ambulatory Visit | Attending: Gastroenterology | Admitting: Gastroenterology

## 2020-10-03 DIAGNOSIS — R1032 Left lower quadrant pain: Secondary | ICD-10-CM

## 2020-10-03 DIAGNOSIS — R059 Cough, unspecified: Secondary | ICD-10-CM

## 2020-10-03 DIAGNOSIS — K219 Gastro-esophageal reflux disease without esophagitis: Secondary | ICD-10-CM | POA: Diagnosis not present

## 2020-10-04 ENCOUNTER — Telehealth: Payer: Self-pay | Admitting: *Deleted

## 2020-10-04 NOTE — Telephone Encounter (Signed)
   [facility] Pre-operative Risk Assessment    Patient Name: Erik [name]  DOB: 01/15/1941  MRN: [id number]   HEARTCARE STAFF: - Please ensure there is not already an duplicate clearance open for this procedure. - Under Visit Info/Reason for Call, type in Other and utilize the format Clearance MM/DD/YY or Clearance TBD. Do not use dashes or single digits. - If request is for dental extraction, please clarify the # of teeth to be extracted.  Request for surgical clearance:  1. What type of surgery is being performed? ENDOSCOPY  2. When is this surgery scheduled? 12/19/20   3. What type of clearance is required (medical clearance vs. Pharmacy clearance to hold med vs. Both)? BOTH  4. Are there any medications that need to be held prior to surgery and how long? [place]   5. Practice name and name of physician performing surgery? [facility]; DR. MAGOD   6. What is the office phone number? [phone]   7.   What is the office fax number? [phone]  8.   Anesthesia type (None, local, MAC, general) ? PROPOFOL    Julaine Hua 10/04/2020, 12:17 PM  _________________________________________________________________   (provider comments below)

## 2020-10-05 NOTE — Telephone Encounter (Signed)
   Name: Erik [name]  DOB: [birth year]  MRN: [id number]   Primary Cardiologist: Freada Bergeron, MD  Chart reviewed as part of pre-operative protocol coverage.  Erik [name] was last seen on 05/08/20 by Dr. [name]. He has upcoming appointment 7/202 to establish care with Dr. Johney Frame.  Left VM requesting call back.   Pending absence of cardiac symptoms as endoscopy is generally low risk procedure, anticipate providing clearance.   Loel Dubonnet, NP 10/05/2020, 3:38 PM

## 2020-10-05 NOTE — Telephone Encounter (Signed)
Patient with diagnosis of atrial fibrillation on Xarelto for anticoagulation.    Procedure: endoscopy Date of procedure: 12/19/20   CHA2DS2-VASc Score = 6  This indicates a 9.7% annual risk of stroke. The patient's score is based upon: CHF History: Yes HTN History: Yes Diabetes History: Yes Stroke History: No Vascular Disease History: Yes Age Score: 2 Gender Score: 0    CrCl 47.1 Platelet count 113  Per office protocol, patient can hold Xarelto for 2 days prior to procedure.    Patient will not need bridging with Lovenox (enoxaparin) around procedure.

## 2020-10-06 NOTE — Telephone Encounter (Signed)
   Name: Erik [name]  DOB: December 05, 1940  MRN: [id number]   Primary Cardiologist: Freada Bergeron, MD  Chart reviewed as part of pre-operative protocol coverage. Patient was contacted 10/06/2020 in reference to pre-operative risk assessment for pending surgery as outlined below.  Erik [name] was last seen on 05/08/20 by Dr. [name].  Since that day, Erik [name] has done well. He reports no anginal symptoms nor dyspnea. He has upcoming appointment to establish with Dr. Johney Frame 12/18/20.   Per pharmacy review and office protocols he may hold Xarelto 2 days prior to the planned procedure - he verbalized understanding of these instructions.  Therefore, based on ACC/AHA guidelines, the patient would be at acceptable risk for the planned procedure without further cardiovascular testing.   The patient was advised that if he develops new symptoms prior to surgery to contact our office to arrange for a follow-up visit, and he verbalized understanding.  I will route this recommendation to the requesting party via Epic fax function and remove from pre-op pool. Please call with questions.  Loel Dubonnet, NP 10/06/2020, 10:58 AM

## 2020-10-12 DIAGNOSIS — R3915 Urgency of urination: Secondary | ICD-10-CM | POA: Diagnosis not present

## 2020-10-12 DIAGNOSIS — N401 Enlarged prostate with lower urinary tract symptoms: Secondary | ICD-10-CM | POA: Diagnosis not present

## 2020-10-12 DIAGNOSIS — R972 Elevated prostate specific antigen [PSA]: Secondary | ICD-10-CM | POA: Diagnosis not present

## 2020-10-25 ENCOUNTER — Other Ambulatory Visit: Payer: Self-pay

## 2020-10-25 ENCOUNTER — Ambulatory Visit: Payer: HMO | Admitting: Podiatry

## 2020-10-25 ENCOUNTER — Encounter: Payer: Self-pay | Admitting: Podiatry

## 2020-10-25 DIAGNOSIS — M216X1 Other acquired deformities of right foot: Secondary | ICD-10-CM | POA: Diagnosis not present

## 2020-10-25 DIAGNOSIS — M216X2 Other acquired deformities of left foot: Secondary | ICD-10-CM

## 2020-10-25 DIAGNOSIS — N183 Chronic kidney disease, stage 3 unspecified: Secondary | ICD-10-CM

## 2020-10-25 DIAGNOSIS — M79674 Pain in right toe(s): Secondary | ICD-10-CM

## 2020-10-25 DIAGNOSIS — E1142 Type 2 diabetes mellitus with diabetic polyneuropathy: Secondary | ICD-10-CM

## 2020-10-25 DIAGNOSIS — B351 Tinea unguium: Secondary | ICD-10-CM | POA: Diagnosis not present

## 2020-10-25 DIAGNOSIS — I739 Peripheral vascular disease, unspecified: Secondary | ICD-10-CM

## 2020-10-25 NOTE — Progress Notes (Signed)
This patient returns to my office for at risk foot care.  This patient requires this care by a professional since this patient will be at risk due to having CKD, Coagulation defect Diabetes and PAD. Patient is taking xarelto. Asymptomatic porokeratosis  B/L.This patient is unable to cut nails himself since the patient cannot reach his nails.These nails are painful walking and wearing shoes.     This patient presents for at risk foot care today.  General Appearance  Alert, conversant and in no acute stress.  Vascular  Dorsalis pedis and posterior tibial  pulses are palpable  bilaterally.  Capillary return is within normal limits  bilaterally. Temperature is within normal limits  bilaterally.  Neurologic  Senn-Weinstein monofilament wire test within normal limits  bilaterally. Muscle power within normal limits bilaterally.  Nails Thick disfigured discolored nails with subungual debris  from hallux to fifth toes bilaterally. No evidence of bacterial infection or drainage bilaterally.  Orthopedic  No limitations of motion  feet .  No crepitus or effusions noted.  No bony pathology or digital deformities noted. Plantar flexed fifth metatarsal  B/L.  Skin  normotropic skin with no porokeratosis noted bilaterally.  No signs of infections or ulcers noted.   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  Onychomycosis  Pain in right toes  Pain in left toes    Consent was obtained for treatment procedures.   Mechanical debridement of nails 1-5  bilaterally performed with a nail nipper.  Filed with dremel without incident.   Return office visit   3 months                   Told patient to return for periodic foot care and evaluation due to potential at risk complications.   Seleni Meller DPM  

## 2020-11-22 DIAGNOSIS — N183 Chronic kidney disease, stage 3 unspecified: Secondary | ICD-10-CM | POA: Diagnosis not present

## 2020-11-22 DIAGNOSIS — I129 Hypertensive chronic kidney disease with stage 1 through stage 4 chronic kidney disease, or unspecified chronic kidney disease: Secondary | ICD-10-CM | POA: Diagnosis not present

## 2020-11-22 DIAGNOSIS — R809 Proteinuria, unspecified: Secondary | ICD-10-CM | POA: Diagnosis not present

## 2020-11-22 DIAGNOSIS — E1122 Type 2 diabetes mellitus with diabetic chronic kidney disease: Secondary | ICD-10-CM | POA: Diagnosis not present

## 2020-11-22 DIAGNOSIS — Q631 Lobulated, fused and horseshoe kidney: Secondary | ICD-10-CM | POA: Diagnosis not present

## 2020-11-22 DIAGNOSIS — D631 Anemia in chronic kidney disease: Secondary | ICD-10-CM | POA: Diagnosis not present

## 2020-11-22 DIAGNOSIS — N2581 Secondary hyperparathyroidism of renal origin: Secondary | ICD-10-CM | POA: Diagnosis not present

## 2020-11-23 DIAGNOSIS — I509 Heart failure, unspecified: Secondary | ICD-10-CM | POA: Diagnosis not present

## 2020-11-23 DIAGNOSIS — E1122 Type 2 diabetes mellitus with diabetic chronic kidney disease: Secondary | ICD-10-CM | POA: Diagnosis not present

## 2020-11-23 DIAGNOSIS — N1832 Chronic kidney disease, stage 3b: Secondary | ICD-10-CM | POA: Diagnosis not present

## 2020-11-23 DIAGNOSIS — I130 Hypertensive heart and chronic kidney disease with heart failure and stage 1 through stage 4 chronic kidney disease, or unspecified chronic kidney disease: Secondary | ICD-10-CM | POA: Diagnosis not present

## 2020-12-18 ENCOUNTER — Ambulatory Visit: Payer: HMO | Admitting: Cardiology

## 2020-12-22 ENCOUNTER — Telehealth: Payer: Self-pay | Admitting: Cardiology

## 2020-12-22 NOTE — Telephone Encounter (Signed)
Patient calling the office for samples of medication:   1.  What medication and dosage are you requesting samples for? Rivaroxaban (XARELTO) 15 MG TABS tablet  2.  Are you currently out of this medication? No. Patient has 5 pills left    Patient is waiting to hear from [facility] regarding his application for the patient assistance foundation. He is in the donut hole and would have to pay $354 for a month's supply

## 2020-12-25 DIAGNOSIS — N189 Chronic kidney disease, unspecified: Secondary | ICD-10-CM | POA: Diagnosis not present

## 2020-12-26 DIAGNOSIS — H31092 Other chorioretinal scars, left eye: Secondary | ICD-10-CM | POA: Diagnosis not present

## 2020-12-26 DIAGNOSIS — E113293 Type 2 diabetes mellitus with mild nonproliferative diabetic retinopathy without macular edema, bilateral: Secondary | ICD-10-CM | POA: Diagnosis not present

## 2020-12-26 DIAGNOSIS — H43813 Vitreous degeneration, bilateral: Secondary | ICD-10-CM | POA: Diagnosis not present

## 2020-12-26 DIAGNOSIS — H35371 Puckering of macula, right eye: Secondary | ICD-10-CM | POA: Diagnosis not present

## 2020-12-26 NOTE — Telephone Encounter (Signed)
Refill dept & Jeani Hawking handles samples. Will forward to correct personnel and follow-up.

## 2020-12-27 NOTE — Telephone Encounter (Signed)
**Note De-Identified Inola Lisle Obfuscation** The pt has not applied for asst with [name] and [name] for his Xarelto this year unless he did so at another MDs office.  No answer so I left a message on the pts VM asking him to call Jeani Hawking at Dr [place] office at [facility] @ [phone].

## 2020-12-28 DIAGNOSIS — K219 Gastro-esophageal reflux disease without esophagitis: Secondary | ICD-10-CM | POA: Diagnosis not present

## 2020-12-28 DIAGNOSIS — K31819 Angiodysplasia of stomach and duodenum without bleeding: Secondary | ICD-10-CM | POA: Diagnosis not present

## 2020-12-28 DIAGNOSIS — K449 Diaphragmatic hernia without obstruction or gangrene: Secondary | ICD-10-CM | POA: Diagnosis not present

## 2021-01-02 NOTE — Telephone Encounter (Signed)
**Note De-Identified Florentine Diekman Obfuscation** Letter received Erik [name] fax from [employer] stating that they have denied the pt asst for his Xarelto. Reason: The pt currently has ins coverage for this medication.  The letter states that they have notified the pt of this denial as well.

## 2021-01-03 ENCOUNTER — Encounter: Payer: Self-pay | Admitting: Podiatry

## 2021-01-03 ENCOUNTER — Other Ambulatory Visit: Payer: Self-pay

## 2021-01-03 ENCOUNTER — Ambulatory Visit: Payer: HMO | Admitting: Podiatry

## 2021-01-03 DIAGNOSIS — I739 Peripheral vascular disease, unspecified: Secondary | ICD-10-CM | POA: Diagnosis not present

## 2021-01-03 DIAGNOSIS — M79674 Pain in right toe(s): Secondary | ICD-10-CM | POA: Diagnosis not present

## 2021-01-03 DIAGNOSIS — E1142 Type 2 diabetes mellitus with diabetic polyneuropathy: Secondary | ICD-10-CM

## 2021-01-03 DIAGNOSIS — B351 Tinea unguium: Secondary | ICD-10-CM | POA: Diagnosis not present

## 2021-01-03 DIAGNOSIS — N183 Chronic kidney disease, stage 3 unspecified: Secondary | ICD-10-CM | POA: Diagnosis not present

## 2021-01-03 NOTE — Progress Notes (Signed)
This patient returns to my office for at risk foot care.  This patient requires this care by a professional since this patient will be at risk due to having CKD, Coagulation defect Diabetes and PAD. Patient is taking xarelto. Asymptomatic porokeratosis  B/L.This patient is unable to cut nails himself since the patient cannot reach his nails.These nails are painful walking and wearing shoes.     This patient presents for at risk foot care today.  General Appearance  Alert, conversant and in no acute stress.  Vascular  Dorsalis pedis and posterior tibial  pulses are palpable  bilaterally.  Capillary return is within normal limits  bilaterally. Temperature is within normal limits  bilaterally.  Neurologic  Senn-Weinstein monofilament wire test within normal limits  bilaterally. Muscle power within normal limits bilaterally.  Nails Thick disfigured discolored nails with subungual debris  from hallux to fifth toes bilaterally. No evidence of bacterial infection or drainage bilaterally.  Orthopedic  No limitations of motion  feet .  No crepitus or effusions noted.  No bony pathology or digital deformities noted. Plantar flexed fifth metatarsal  B/L.  Skin  normotropic skin with no porokeratosis noted bilaterally.  No signs of infections or ulcers noted.   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  Onychomycosis  Pain in right toes  Pain in left toes    Consent was obtained for treatment procedures.   Mechanical debridement of nails 1-5  bilaterally performed with a nail nipper.  Filed with dremel without incident.   Return office visit   3 months                   Told patient to return for periodic foot care and evaluation due to potential at risk complications.   Shontay Wallner DPM  

## 2021-01-09 ENCOUNTER — Ambulatory Visit: Payer: HMO | Admitting: Cardiology

## 2021-01-12 DIAGNOSIS — I509 Heart failure, unspecified: Secondary | ICD-10-CM | POA: Diagnosis not present

## 2021-01-12 DIAGNOSIS — I480 Paroxysmal atrial fibrillation: Secondary | ICD-10-CM | POA: Diagnosis not present

## 2021-01-12 DIAGNOSIS — E1129 Type 2 diabetes mellitus with other diabetic kidney complication: Secondary | ICD-10-CM | POA: Diagnosis not present

## 2021-01-12 DIAGNOSIS — I1310 Hypertensive heart and chronic kidney disease without heart failure, with stage 1 through stage 4 chronic kidney disease, or unspecified chronic kidney disease: Secondary | ICD-10-CM | POA: Diagnosis not present

## 2021-01-12 DIAGNOSIS — N1832 Chronic kidney disease, stage 3b: Secondary | ICD-10-CM | POA: Diagnosis not present

## 2021-01-12 DIAGNOSIS — Z7901 Long term (current) use of anticoagulants: Secondary | ICD-10-CM | POA: Diagnosis not present

## 2021-01-12 DIAGNOSIS — D696 Thrombocytopenia, unspecified: Secondary | ICD-10-CM | POA: Diagnosis not present

## 2021-01-12 DIAGNOSIS — I130 Hypertensive heart and chronic kidney disease with heart failure and stage 1 through stage 4 chronic kidney disease, or unspecified chronic kidney disease: Secondary | ICD-10-CM | POA: Diagnosis not present

## 2021-01-22 NOTE — Progress Notes (Signed)
Cardiology Office Note:    Date:  01/23/2021   ID:  Erik [name], Erik [name] [birth year], MRN [id number]  PCP:  [name], MD   [facility] Providers Cardiologist:  Freada Bergeron, MD      Referring MD: [name], MD   Follow-up for paroxysmal atrial fibrillation and nonischemic cardiomyopathy  History of Present Illness:    [name] is a 80 y.o. male with a hx of coronary artery disease (cardiac catheterization 2004 showed no significant stenosis), PVCs, paroxysmal atrial fibrillation, nonischemic cardiomyopathy (EF 35-40% 2009, 55% in 2010, 35-45% 2015 during admission for atrial fibrillation with RVR).  Previously his diltiazem was stopped due to dizziness and bradycardia 9/20.  Echocardiogram 8/16 showed an EF 50-55%, hypokinesis of the basal mid inferior myocardium, G1 DD, mild MR, mild left atrial enlargement.  His PMH also includes chronic combined CHF, CKD stage III, diabetes, hypertension, hyperlipidemia.  He was seen by Dr. [name] 4/21 and complained of claudications.  His arterial Dopplers were normal.  He was seen by Dr. [name] 05/08/2020.  During that time he was emotional due to patient his son to cancer.  He reported minimal lower extremity edema, no orthopnea, PND, palpitations, dizziness, and syncope.  He was having a hard time obtaining his Xarelto and was waiting on patient assistance.  He denied bleeding issues and myalgias.  He presents the clinic today for follow-up evaluation states he continues to feel well.  He remains physically active doing yard work and chores around his house.  He reports that he is not as physically active as he used to be.  He has reduced the calories in his diet and has lost around 20 pounds.  He does not notice any irregular or accelerated heartbeats.  He reports compliance with his Xarelto and denies bleeding issues.  He was unable to receive assistance from [facility] and now gets his medication monthly so  that is easier for him to afford.  His main complaint today is a runny nose.  He presented to his PCP who felt that he may have a sinus infection.  However he describes a runny nose at times when he is only indoors.  His symptoms appear to be related to environmental exposures.  I have asked him to use saline nasal rinse and follow-up with his PCP for further evaluation and treatment.  I will give him a salty 6 diet sheet, have him maintain his physical activity and follow-up in 12 months.  Today he denies chest pain, shortness of breath, lower extremity edema, fatigue, palpitations, melena, hematuria, hemoptysis, diaphoresis, weakness, presyncope, syncope, orthopnea, and PND.    Past Medical History:  Diagnosis Date   Chronic combined systolic and diastolic CHF (congestive heart failure) (HCC)    CKD (chronic kidney disease), stage III (HCC)    Coronary artery disease    a. Nonobstructive by cath 2004. b. Nuc 09/2013 - no ischemia, fixed defect suggestive of possible prior infarction, felt low risk, EF 48%.    Diabetes mellitus    GERD (gastroesophageal reflux disease)    Hyperlipidemia    Hypertension    NICM (nonischemic cardiomyopathy) ([place])    a. EF 35-40% in 2009, 55% in 2010. b. 09/2013: 35-40% by echo and 48% by nuc. c. EF 50-55% by echo 12/2014.   PAD (peripheral artery disease) (HCC)    a. mild by noninvasive testing.   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    Premature ventricular contractions    Thrombocytopenia (HCC)    Trigeminal neuralgia     Past Surgical History:  Procedure Laterality Date   CARDIAC CATHETERIZATION  10/25/00, 12/29/02   CHOLECYSTECTOMY N/A 01/25/2015   Procedure: LAPAROSCOPIC CHOLECYSTECTOMY ;  Surgeon: [name], MD;  Location: [facility];  Service: General;  Laterality: N/A;   SHOULDER SURGERY Left 2011   Shoulder surgery, rod inserted    Current Medications: Current Meds  Medication Sig    acetaminophen (TYLENOL) 325 MG tablet Take 2 tablets (650 mg total) by mouth every 6 (six) hours as needed for mild pain (or Fever >/= 101).   amoxicillin (AMOXIL) 875 MG tablet 1 tablet   arginine 500 MG tablet Take 1,000 mg by mouth daily.   atorvastatin (LIPITOR) 40 MG tablet Take 1 tablet (40 mg total) by mouth daily at 6 PM.   BD PEN NEEDLE NANO U/F 32G X 4 MM MISC USE 1 NEEDLE WITH INSULIN INJECTION ONCE DAILY   Blood Glucose Calibration (TRUE METRIX LEVEL 1) Low SOLN USE TO CALIBRATE GLUCOMETER ONCE MONTHY OR WITH EACH NEW VIAL OF STRIPS, WHICHEVER COMES FIRST   Blood Glucose Monitoring Suppl (TRUE METRIX AIR GLUCOSE METER) w/Device KIT use to check blood sugar three times daily prior to meals. E11.29   Coenzyme Q10 (COQ10) 200 MG CAPS Take 100 mg by mouth daily.    finasteride (PROSCAR) 5 MG tablet Take 5 mg by mouth once daily   furosemide (LASIX) 40 MG tablet Take 1 tablet (40 mg total) by mouth as needed for fluid or edema.   glucose blood (TRUE METRIX BLOOD GLUCOSE TEST) test strip use to check blood sugar three times daily prior to meals. E11.29   metoprolol tartrate (LOPRESSOR) 50 MG tablet Take 1.5 tablets (75 mg total) by mouth 2 (two) times daily.   Multiple Vitamin (MULTIVITAMIN) capsule Take 1 capsule by mouth daily.   Multiple Vitamins-Calcium (ESSENTIAL ONE DAILY MULTIVIT) TABS take one a day   pantoprazole (PROTONIX) 40 MG tablet Take 1 tablet by mouth daily.   Rivaroxaban (XARELTO) 15 MG TABS tablet TAKE 1 TABLET(15 MG) BY MOUTH DAILY WITH SUPPER   selenium 200 MCG TABS tablet Take 200 mcg by mouth daily.   tamsulosin (FLOMAX) 0.4 MG CAPS capsule Take 0.4 mg by mouth at bedtime.   TOUJEO SOLOSTAR 300 UNIT/ML SOPN Inject 36 Units as directed daily.    TRUEplus Lancets 33G MISC use to check blood sugar three times daily prior to meals. E11.29     Allergies:   Claritin [loratadine] and Tradjenta [linagliptin]   Social History   Socioeconomic History   Marital status:  Married    Spouse name: Not on file   Number of children: Not on file   Years of education: Not on file   Highest education level: Not on file  Occupational History   Occupation: retired    Comment: from maintenance  Tobacco Use   Smoking status: Former    Packs/day: 0.50    Years: 10.00    Pack years: 5.00    Types: Cigarettes    Quit date: 01/21/1985    Years since quitting: 36.0   Smokeless tobacco: Never   Tobacco comments:    quit 35 yrs ago  Vaping Use   Vaping Use: Never used  Substance and Sexual Activity   Alcohol use: No   Drug use: No   Sexual activity: Not Currently    Birth control/protection: None  Other  Topics Concern   Not on file  Social History Narrative   Lives with wife in a one story home.  Has 3 children.  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     Education: high school.   Social Determinants of Radio broadcast assistant Strain: Not on file  Food Insecurity: No Food Insecurity   Worried About Charity fundraiser in the Last Year: Never true   Arboriculturist in the Last Year: Never true  Transportation Needs: Not on file  Physical Activity: Not on file  Stress: Not on file  Social Connections: Not on file     Family History: The patient's family history includes Diabetes in his brother and mother; Heart disease in his brother, father, and mother; Hypertension in his brother and mother. There is no history of Heart attack or Stroke.  ROS:   Please see the history of present illness.     All other systems reviewed and are negative.   Risk Assessment/Calculations:           Physical Exam:    VS:  BP 126/62   Pulse 61   Ht 5' 10"  (1.778 m)   Wt 193 lb 12.8 oz (87.9 kg)   SpO2 95%   BMI 27.81 kg/m     Wt Readings from Last 3 Encounters:  01/23/21 193 lb 12.8 oz (87.9 kg)  05/08/20 197 lb 9.6 oz (89.6 kg)  01/05/20 195 lb 12.8 oz (88.8 kg)     GEN:  Well nourished, well developed in no acute distress HEENT:  Normal NECK: No JVD; No carotid bruits LYMPHATICS: No lymphadenopathy CARDIAC: RRR, no murmurs, rubs, gallops RESPIRATORY:  Clear to auscultation without rales, wheezing or rhonchi  ABDOMEN: Soft, non-tender, non-distended MUSCULOSKELETAL:  No edema; No deformity  SKIN: Warm and dry NEUROLOGIC:  Alert and oriented x 3 PSYCHIATRIC:  Normal affect    EKGs/Labs/Other Studies Reviewed:    The following studies were reviewed today:  Echocardiogram 01/23/2015 Study Conclusions   - Left ventricle: The cavity size was normal. Systolic function was    normal. The estimated ejection fraction was in the range of 50%    to 55%. There is hypokinesis of the basal-midinferior myocardium.    Doppler parameters are consistent with abnormal left ventricular    relaxation (grade 1 diastolic dysfunction).  - Ventricular septum: Septal motion showed paradox.  - Mitral valve: Mildly calcified annulus. There was mild    regurgitation.  - Left atrium: The atrium was mildly dilated.   Impressions:   - Compared to the prior study, there has been no significant    interval change.  EKG:  EKG is ordered today.  The ekg ordered today demonstrates sinus rhythm with marked sinus arrhythmia 61 bpm  Recent Labs: No results found for requested labs within last 8760 hours.  Recent Lipid Panel No results found for: CHOL, TRIG, HDL, CHOLHDL, VLDL, LDLCALC, LDLDIRECT  ASSESSMENT & PLAN    Paroxysmal atrial fibrillation-EKG today shows sinus rhythm with marked sinus arrhythmia and 61 bpm.  No recent episodes of irregular or increased heartbeat.  Reports compliance with his Xarelto and denies bleeding issues. Continue Xarelto, metoprolol, diltiazem Heart healthy low-sodium diet-salty 6 given Increase physical activity as tolerated  Nonischemic cardiomyopathy-no increased DOE or activity intolerance.  Echocardiogram 8/16 showed LVEF 50-55%.  Continues to be physically active. Continue metoprolol Heart  healthy low-sodium diet-salty 6 given Increase physical activity as tolerated  Chronic combined systolic and diastolic  CHF-euvolemic today. Continue metoprolol, furosemide Heart healthy low-sodium diet-salty 6 given Increase physical activity as tolerated  Essential hypertension-BP today 126/62.  Previously noted to have episodes of orthostatic hypotension.  Reports well-controlled blood pressure at home. Continue metoprolol Heart healthy low-sodium diet-salty 6 given Increase physical activity as tolerated  Nonobstructive coronary artery disease-underwent cardiac catheterization 2004 which showed no significant coronary disease. Continue atorvastatin, metoprolol Heart healthy low-sodium diet-salty 6 given Increase physical activity as tolerated   Hyperlipidemia-LDL 40 on 08/11/20 Continue atorvastatin Heart healthy low-sodium high-fiber diet Increase physical activity as tolerated Follows with PCP  Disposition: Follow-up with cardiology in 9-12 months.       Medication Adjustments/Labs and Tests Ordered: Current medicines are reviewed at length with the patient today.  Concerns regarding medicines are outlined above.  No orders of the defined types were placed in this encounter.  No orders of the defined types were placed in this encounter.   There are no Patient Instructions on file for this visit.   Signed, Deberah Pelton, NP  01/23/2021 2:10 PM      Notice: This dictation was prepared with Dragon dictation along with smaller phrase technology. Any transcriptional errors that result from this process are unintentional and may not be corrected upon review.  I spent 15 minutes examining this patient, reviewing medications, and using patient centered shared decision making involving her cardiac care.  Prior to her visit I spent greater than 20 minutes reviewing her past medical history,  medications, and prior cardiac tests.

## 2021-01-23 ENCOUNTER — Ambulatory Visit (HOSPITAL_BASED_OUTPATIENT_CLINIC_OR_DEPARTMENT_OTHER): Payer: HMO | Admitting: General Practice

## 2021-01-23 ENCOUNTER — Other Ambulatory Visit: Payer: Self-pay

## 2021-01-23 ENCOUNTER — Encounter (HOSPITAL_BASED_OUTPATIENT_CLINIC_OR_DEPARTMENT_OTHER): Payer: Self-pay | Admitting: General Practice

## 2021-01-23 VITALS — BP 126/62 | HR 61 | Ht 70.0 in | Wt 193.8 lb

## 2021-01-23 DIAGNOSIS — I480 Paroxysmal atrial fibrillation: Secondary | ICD-10-CM

## 2021-01-23 DIAGNOSIS — I10 Essential (primary) hypertension: Secondary | ICD-10-CM

## 2021-01-23 DIAGNOSIS — E785 Hyperlipidemia, unspecified: Secondary | ICD-10-CM | POA: Diagnosis not present

## 2021-01-23 DIAGNOSIS — I5042 Chronic combined systolic (congestive) and diastolic (congestive) heart failure: Secondary | ICD-10-CM | POA: Diagnosis not present

## 2021-01-23 DIAGNOSIS — I428 Other cardiomyopathies: Secondary | ICD-10-CM

## 2021-01-23 DIAGNOSIS — I2583 Coronary atherosclerosis due to lipid rich plaque: Secondary | ICD-10-CM

## 2021-01-23 DIAGNOSIS — I2510 Atherosclerotic heart disease of native coronary artery without angina pectoris: Secondary | ICD-10-CM

## 2021-01-23 NOTE — Addendum Note (Signed)
Addended by: Vennie Homans on: 01/23/2021 04:43 PM   Modules accepted: Orders

## 2021-01-23 NOTE — Patient Instructions (Signed)
Medication Instructions:  Your physician recommends that you continue on your current medications as directed. Please refer to the Current Medication list given to you today.  *If you need a refill on your cardiac medications before your next appointment, please call your pharmacy*   Follow-Up: At [facility], you and your health needs are our priority.  As part of our continuing mission to provide you with exceptional heart care, we have created designated Provider Care Teams.  These Care Teams include your primary Cardiologist (physician) and Advanced Practice Providers (APPs -  Physician Assistants and Nurse Practitioners) who all work together to provide you with the care you need, when you need it.  We recommend signing up for the patient portal called "MyChart".  Sign up information is provided on this After Visit Summary.  MyChart is used to connect with patients for Virtual Visits (Telemedicine).  Patients are able to view lab/test results, encounter notes, upcoming appointments, etc.  Non-urgent messages can be sent to your provider as well.   To learn more about what you can do with MyChart, go to [email or web address].    Your next appointment:   12 month(s)  The format for your next appointment:   In Person  Provider:   You may see Freada Bergeron, MD or one of the following Advanced Practice Providers on your designated Care Team:   Richardson Dopp, PA-C [place], [place]   Other Instructions  Coletta Memos, FNP has recommended using Saline rinse for sinuses. He also recommends maintaining your physical activity and starting a low salt diet. Please review the information below. Thank you!

## 2021-01-24 DIAGNOSIS — I509 Heart failure, unspecified: Secondary | ICD-10-CM | POA: Diagnosis not present

## 2021-01-24 DIAGNOSIS — E1122 Type 2 diabetes mellitus with diabetic chronic kidney disease: Secondary | ICD-10-CM | POA: Diagnosis not present

## 2021-01-24 DIAGNOSIS — N1832 Chronic kidney disease, stage 3b: Secondary | ICD-10-CM | POA: Diagnosis not present

## 2021-01-24 DIAGNOSIS — I130 Hypertensive heart and chronic kidney disease with heart failure and stage 1 through stage 4 chronic kidney disease, or unspecified chronic kidney disease: Secondary | ICD-10-CM | POA: Diagnosis not present

## 2021-02-23 DIAGNOSIS — N1832 Chronic kidney disease, stage 3b: Secondary | ICD-10-CM | POA: Diagnosis not present

## 2021-02-23 DIAGNOSIS — I130 Hypertensive heart and chronic kidney disease with heart failure and stage 1 through stage 4 chronic kidney disease, or unspecified chronic kidney disease: Secondary | ICD-10-CM | POA: Diagnosis not present

## 2021-02-23 DIAGNOSIS — I509 Heart failure, unspecified: Secondary | ICD-10-CM | POA: Diagnosis not present

## 2021-02-23 DIAGNOSIS — E1122 Type 2 diabetes mellitus with diabetic chronic kidney disease: Secondary | ICD-10-CM | POA: Diagnosis not present

## 2021-02-26 DIAGNOSIS — N1832 Chronic kidney disease, stage 3b: Secondary | ICD-10-CM | POA: Diagnosis not present

## 2021-02-26 DIAGNOSIS — Z7901 Long term (current) use of anticoagulants: Secondary | ICD-10-CM | POA: Diagnosis not present

## 2021-02-26 DIAGNOSIS — F411 Generalized anxiety disorder: Secondary | ICD-10-CM | POA: Diagnosis not present

## 2021-02-26 DIAGNOSIS — I1310 Hypertensive heart and chronic kidney disease without heart failure, with stage 1 through stage 4 chronic kidney disease, or unspecified chronic kidney disease: Secondary | ICD-10-CM | POA: Diagnosis not present

## 2021-02-26 DIAGNOSIS — D692 Other nonthrombocytopenic purpura: Secondary | ICD-10-CM | POA: Diagnosis not present

## 2021-02-26 DIAGNOSIS — D6869 Other thrombophilia: Secondary | ICD-10-CM | POA: Diagnosis not present

## 2021-02-26 DIAGNOSIS — E7800 Pure hypercholesterolemia, unspecified: Secondary | ICD-10-CM | POA: Diagnosis not present

## 2021-02-26 DIAGNOSIS — D696 Thrombocytopenia, unspecified: Secondary | ICD-10-CM | POA: Diagnosis not present

## 2021-02-26 DIAGNOSIS — E1129 Type 2 diabetes mellitus with other diabetic kidney complication: Secondary | ICD-10-CM | POA: Diagnosis not present

## 2021-02-26 DIAGNOSIS — Z23 Encounter for immunization: Secondary | ICD-10-CM | POA: Diagnosis not present

## 2021-02-26 DIAGNOSIS — I509 Heart failure, unspecified: Secondary | ICD-10-CM | POA: Diagnosis not present

## 2021-02-26 DIAGNOSIS — I480 Paroxysmal atrial fibrillation: Secondary | ICD-10-CM | POA: Diagnosis not present

## 2021-02-28 DIAGNOSIS — K219 Gastro-esophageal reflux disease without esophagitis: Secondary | ICD-10-CM | POA: Diagnosis not present

## 2021-03-02 ENCOUNTER — Ambulatory Visit (HOSPITAL_COMMUNITY)
Admission: EM | Admit: 2021-03-02 | Discharge: 2021-03-02 | Disposition: A | Discharge status: Home / Self Care | Payer: HMO | Attending: Student | Admitting: Student

## 2021-03-02 ENCOUNTER — Other Ambulatory Visit: Payer: Self-pay

## 2021-03-02 ENCOUNTER — Encounter (HOSPITAL_COMMUNITY): Payer: Self-pay | Admitting: Emergency Medicine

## 2021-03-02 DIAGNOSIS — R239 Unspecified skin changes: Secondary | ICD-10-CM

## 2021-03-02 DIAGNOSIS — T63441A Toxic effect of venom of bees, accidental (unintentional), initial encounter: Secondary | ICD-10-CM | POA: Diagnosis not present

## 2021-03-02 MED ORDER — TRIAMCINOLONE ACETONIDE 0.5 % EX OINT: 1.0000 "application " | TOPICAL_OINTMENT | Freq: Two times a day (BID) | CUTANEOUS | 0 refills | Status: AC

## 2021-03-02 MED ORDER — METHYLPREDNISOLONE SODIUM SUCC 125 MG IJ SOLR
40.0000 mg | Freq: Once | INTRAMUSCULAR | Status: AC
Start: 2021-03-02 — End: 2021-03-02
  Administered 2021-03-02: 40 mg via INTRAMUSCULAR

## 2021-03-02 MED ORDER — METHYLPREDNISOLONE SODIUM SUCC 125 MG IJ SOLR
INTRAMUSCULAR | Status: AC
  Filled 2021-03-02: qty 2

## 2021-03-02 NOTE — ED Triage Notes (Signed)
Patient c/o bee sting that occurred yesterday.   Patient endorses redness and swelling to RT wrist that started today (30 minutes ago).   Patient endorses worsening symptoms.   Patient endorses itching.   Patient has used alcohol and "anti itch OTC" cream with no relief.

## 2021-03-02 NOTE — ED Provider Notes (Signed)
[facility]    CSN: [id number] Arrival date & time: 03/02/21  1914      History   Chief Complaint Chief Complaint  Patient presents with   Insect Bite    HPI Erik [name] is a 80 y.o. male presenting with multiple yellowjacket stings right wrist.  Medical history extensive as below.  Patient does a lot of work outside, states he was mowing the lawn when about 4 yellow jackets or bees stung the inner part of his right wrist.  Immediately applied alcohol with some improvement, but the swelling and discomfort has persisted. Also tried some OTC anti-itch cream with minimal improvement. States the swelling is actually getting a little bit worse.  Denies facial swelling, lip swelling, shortness of breath, sensation of throat closing, dizziness, nausea.  HPI  Past Medical History:  Diagnosis Date   Chronic combined systolic and diastolic CHF (congestive heart failure) (HCC)    CKD (chronic kidney disease), stage III (HCC)    Coronary artery disease    a. Nonobstructive by cath 2004. b. Nuc 09/2013 - no ischemia, fixed defect suggestive of possible prior infarction, felt low risk, EF 48%.    Diabetes mellitus    GERD (gastroesophageal reflux disease)    Hyperlipidemia    Hypertension    NICM (nonischemic cardiomyopathy) ([place])    a. EF 35-40% in 2009, 55% in 2010. b. 09/2013: 35-40% by echo and 48% by nuc. c. EF 50-55% by echo 12/2014.   PAD (peripheral artery disease) (HCC)    a. mild by noninvasive testing.   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    Premature ventricular contractions    Thrombocytopenia ([place])    Trigeminal neuralgia     Patient Active Problem List   Diagnosis Date Noted   Annual physical exam 08/16/2020   PAD (peripheral artery disease) ([place]) 11/02/2019   Visual field defect 08/12/2019   Pain due to onychomycosis of toenail of right foot 07/02/2019   Diabetic neuropathy ([place]) 03/31/2019   Plantar  flexed metatarsal bone of left foot 03/31/2019   Plantar flexed metatarsal bone of right foot 03/31/2019   Porokeratosis 03/31/2019   Thrombophilia ([place]) 02/09/2019   Thrombocytopenia ([place]) 08/03/2018   Hyperlipidemia    Benign neoplasm of colon 56/21/3086   Systolic CHF ([place]) 57/84/6962   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   Non-thrombocytopenic purpura ([place]) 02/23/2015   Generalized anxiety disorder 02/09/2015   Localized edema 02/09/2015   Acute biliary pancreatitis    Acute on chronic renal failure (HCC)    Pancreatitis 01/22/2015   Acute pancreatitis 01/22/2015   Abdominal pain    Gallstones    Acute gallstone pancreatitis    Actinic keratosis 07/13/2014   Low back pain 01/11/2014   Long term (current) use of anticoagulants 09/29/2013   CKD (chronic kidney disease), stage III (HCC)    Atrial fibrillation (HCC)    Coronary artery disease    NICM (nonischemic cardiomyopathy) ([place])    Hypertension    GERD (gastroesophageal reflux disease)    Premature ventricular contractions    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    Chest pain 09/23/2013   Elevated PSA 07/05/2013   Cellulitis 12/02/2012   Diabetes mellitus ([place]) 12/02/2012   Proteinuria 07/07/2012   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   Benign prostatic hyperplasia with lower urinary tract symptoms 03/09/2009   Diabetic renal disease ([place]) 03/09/2009   Pure hypercholesterolemia 03/09/2009  HYPERLIPIDEMIA 10/11/2008    Past Surgical History:  Procedure Laterality Date   CARDIAC CATHETERIZATION  10/25/00, 12/29/02   CHOLECYSTECTOMY N/A 01/25/2015   Procedure: LAPAROSCOPIC CHOLECYSTECTOMY ;  Surgeon: [name], MD;  Location: [facility];  Service: General;  Laterality: N/A;   SHOULDER SURGERY Left 2011   Shoulder surgery, rod inserted       Home Medications    Prior to Admission medications   Medication Sig Start Date End Date Taking? Authorizing Provider  triamcinolone  ointment (KENALOG) 0.5 % Apply 1 application topically 2 (two) times daily for 7 days. Avoid face and genitals 03/02/21 03/09/21 Yes Hazel Sams, PA-C  acetaminophen (TYLENOL) 325 MG tablet Take 2 tablets (650 mg total) by mouth every 6 (six) hours as needed for mild pain (or Fever >/= 101). 01/29/15   Elgergawy, Silver Huguenin, MD  amoxicillin (AMOXIL) 875 MG tablet 1 tablet    [provider]  arginine 500 MG tablet Take 1,000 mg by mouth daily.    [provider]  atorvastatin (LIPITOR) 40 MG tablet Take 1 tablet (40 mg total) by mouth daily at 6 PM. 03/29/16   [name], MD  atorvastatin (LIPITOR) 40 MG tablet Take 1 tablet by mouth daily. Patient not taking: No sig reported    [provider]  BD PEN NEEDLE NANO U/F 32G X 4 MM MISC USE 1 NEEDLE WITH INSULIN INJECTION ONCE DAILY 03/04/18   [provider]  benzonatate (TESSALON) 100 MG capsule Take 100 mg by mouth 3 (three) times daily as needed. Patient not taking: No sig reported 06/05/20   [provider]  Blood Glucose Calibration (TRUE METRIX LEVEL 1) Low SOLN USE TO CALIBRATE GLUCOMETER ONCE MONTHY OR WITH EACH NEW VIAL OF STRIPS, WHICHEVER COMES FIRST 10/13/17   [provider]  Blood Glucose Monitoring Suppl (TRUE METRIX AIR GLUCOSE METER) w/Device KIT use to check blood sugar three times daily prior to meals. E11.29 10/09/17   [provider]  citalopram (CELEXA) 40 MG tablet Take 40 mg by mouth daily. Patient not taking: No sig reported 02/26/16   [provider]  clonazePAM (KLONOPIN) 0.5 MG tablet Take 0.5 mg by mouth daily as needed.  Patient not taking: No sig reported 01/02/15   [provider]  Coenzyme Q10 (COQ10) 200 MG CAPS Take 100 mg by mouth daily.     [provider]  diltiazem (CARDIZEM CD) 120 MG 24 hr capsule Take 1 capsule po qd Patient not taking: No sig reported 02/09/15   [provider]  finasteride (PROSCAR) 5 MG tablet  Take 5 mg by mouth once daily 03/06/17   [provider]  furosemide (LASIX) 40 MG tablet Take 1 tablet (40 mg total) by mouth as needed for fluid or edema. 07/01/19   Daune Perch, NP  glucose blood (TRUE METRIX BLOOD GLUCOSE TEST) test strip use to check blood sugar three times daily prior to meals. E11.29 10/09/17   [provider]  ipratropium (ATROVENT) 0.03 % nasal spray 2 sprays in each nostril Patient not taking: No sig reported 06/05/20   [provider]  lamoTRIgine (LAMICTAL) 25 MG tablet Take 1 tablet by mouth daily for 1 week then increase to 2x daily. Dtop if you develop any rash Patient not taking: No sig reported 08/03/18   [provider]  metoprolol succinate (TOPROL-XL) 50 MG 24 hr tablet take 1 1/2 tablet twice daily Patient not taking: No sig reported 03/09/09   [provider]  metoprolol tartrate (LOPRESSOR) 50 MG tablet Take 1.5 tablets (75 mg total) by mouth 2 (two) times daily. 06/26/20   [name], MD  moxifloxacin (VIGAMOX) 0.5 % ophthalmic solution Place 1 drop into the left eye 4 (four) times daily. Patient not taking: No sig reported 11/11/19   [provider]  Multiple Vitamin (MULTIVITAMIN) capsule Take 1 capsule by mouth daily.    [provider]  Multiple Vitamins-Calcium (ESSENTIAL ONE DAILY MULTIVIT) TABS take one a day 07/07/12   [provider]  omeprazole (PRILOSEC) 20 MG capsule Take 20 mg by mouth daily. Patient not taking: No sig reported    [provider]  pantoprazole (PROTONIX) 40 MG tablet Take 1 tablet by mouth daily.    [provider]  PROLENSA 0.07 % SOLN  11/11/19   [provider]  Rivaroxaban (XARELTO) 15 MG TABS tablet TAKE 1 TABLET(15 MG) BY MOUTH DAILY WITH SUPPER 05/30/20   [name], MD  selenium 200 MCG TABS tablet Take 200 mcg by mouth daily.    [provider]  tamsulosin (FLOMAX) 0.4 MG CAPS capsule Take 0.4 mg by  mouth at bedtime.    [provider]  TOUJEO SOLOSTAR 300 UNIT/ML SOPN Inject 36 Units as directed daily.  03/12/16   [provider]  Trospium Chloride 60 MG CP24 1 tablet po qhs Patient not taking: No sig reported 02/02/18   [provider]  TRUEplus Lancets 33G MISC use to check blood sugar three times daily prior to meals. E11.29 10/09/17   [provider]    Family History Family History  Problem Relation Age of Onset   Heart disease Mother    Diabetes Mother    Hypertension Mother    Heart disease Father    Heart disease Brother    Diabetes Brother    Hypertension Brother    Heart attack Neg Hx    Stroke Neg Hx     Social History Social History   Tobacco Use   Smoking status: Former    Packs/day: 0.50    Years: 10.00    Pack years: 5.00    Types: Cigarettes    Quit date: 01/21/1985    Years since quitting: 36.1   Smokeless tobacco: Never   Tobacco comments:    quit 35 yrs ago  Vaping Use   Vaping Use: Never used  Substance Use Topics   Alcohol use: No   Drug use: No     Allergies   Claritin [loratadine] and Tradjenta [linagliptin]   Review of Systems Review of Systems  Skin:  Positive for color change.  All other systems reviewed and are negative.   Physical Exam Triage Vital Signs ED Triage Vitals  Enc Vitals Group     BP 03/02/21 1933 (!) 148/62     Pulse Rate 03/02/21 1933 61     Resp 03/02/21 1933 12     Temp 03/02/21 1933 98.5 F (36.9 C)     Temp Source 03/02/21 1933 Oral     SpO2 03/02/21 1933 94 %     Weight --      Height --      Head Circumference --      Peak Flow --      Pain Score 03/02/21 1930 7     Pain Loc --      Pain Edu? --      Excl. in [place]? --    No data found.  Updated Vital Signs BP (!) 148/62 (BP Location: Right Arm)   Pulse 61   Temp 98.5 F (36.9 C) (Oral)   Resp 12   SpO2 94%   Visual Acuity Right Eye Distance:   Left Eye Distance:   Bilateral Distance:    Right Eye  Near:   Left Eye Near:    Bilateral Near:     Physical Exam Vitals reviewed.  Constitutional:      General: He is not in acute distress.    Appearance: Normal appearance. He is not ill-appearing or diaphoretic.  HENT:     Head: Normocephalic and atraumatic.  Cardiovascular:     Rate and Rhythm: Normal rate and regular rhythm.     Heart sounds: Normal heart sounds.  Pulmonary:     Effort: Pulmonary effort is normal.     Breath sounds: Normal breath sounds.  Skin:    General: Skin is warm.     Comments: See image below R ventral wrist with effusion erythema and excoriations. No warmth, discharge, induration or fluctuance. Radial pulse 2+, grip strength 5/5, cap refill <2 seconds.  No facial, lip, tongue, uvula swelling.  Neurological:     General: No focal deficit present.     Mental Status: He is alert and oriented to person, place, and time.  Psychiatric:        Mood and Affect: Mood normal.        Behavior: Behavior normal.        Thought Content: Thought content normal.        Judgment: Judgment normal.       UC Treatments / Results  Labs (all labs ordered are listed, but only abnormal results are displayed) Labs Reviewed - No data to display  EKG   Radiology No results found.  Procedures Procedures (including critical care time)  Medications Ordered in UC Medications  methylPREDNISolone sodium succinate (SOLU-MEDROL) 125 mg/2 mL injection 40 mg (has no administration in time range)    Initial Impression / Assessment and Plan / UC Course  I have reviewed the triage vital signs and the nursing notes.  Pertinent labs & imaging results that were available during my care of the patient were reviewed by me and considered in my medical decision making (see chart for details).     This patient is a very pleasant 80 y.o. year old male presenting with localized allergic reaction following multiple yellow jacket stings. No history anaphylaxis.   Solumedrol 40m  IM administered. Benedryl PO at home. Triamcniolone sent.   Strict ED return precautions discussed. Patient verbalizes understanding and agreement.     Final Clinical Impressions(s) / UC Diagnoses   Final diagnoses:  Skin change  Bee sting, accidental or unintentional, initial encounter     Discharge Instructions      -Starting tomorrow, triamcinolone cream twice daily for about 7 days -Benedryl (diphenhydramine) 25-533m(1-2 pills) as needed for itching, up to every 6 hours.  This medication will cause drowsiness. -Seek additional medical attention if symptoms getting worse instead of better, like the arm gets more red and swollen, new facial symptoms like itching, swelling, sensation of throat closing.   ED Prescriptions     Medication Sig Dispense Auth. Provider   triamcinolone ointment (KENALOG) 0.5 % Apply 1 application topically 2 (two) times daily for 7 days. Avoid face and genitals 30 g GrHazel SamsPA-C      PDMP not reviewed this encounter.   GrHazel SamsPA-C 03/03/21 1008

## 2021-03-02 NOTE — Discharge Instructions (Addendum)
-  Starting tomorrow, triamcinolone cream twice daily for about 7 days -Benedryl (diphenhydramine) 25-50mg  (1-2 pills) as needed for itching, up to every 6 hours.  This medication will cause drowsiness. -Seek additional medical attention if symptoms getting worse instead of better, like the arm gets more red and swollen, new facial symptoms like itching, swelling, sensation of throat closing.

## 2021-03-07 DIAGNOSIS — I129 Hypertensive chronic kidney disease with stage 1 through stage 4 chronic kidney disease, or unspecified chronic kidney disease: Secondary | ICD-10-CM | POA: Diagnosis not present

## 2021-03-07 DIAGNOSIS — E1122 Type 2 diabetes mellitus with diabetic chronic kidney disease: Secondary | ICD-10-CM | POA: Diagnosis not present

## 2021-03-07 DIAGNOSIS — N189 Chronic kidney disease, unspecified: Secondary | ICD-10-CM | POA: Diagnosis not present

## 2021-03-07 DIAGNOSIS — N2581 Secondary hyperparathyroidism of renal origin: Secondary | ICD-10-CM | POA: Diagnosis not present

## 2021-03-07 DIAGNOSIS — N183 Chronic kidney disease, stage 3 unspecified: Secondary | ICD-10-CM | POA: Diagnosis not present

## 2021-03-07 DIAGNOSIS — R809 Proteinuria, unspecified: Secondary | ICD-10-CM | POA: Diagnosis not present

## 2021-03-07 DIAGNOSIS — Q631 Lobulated, fused and horseshoe kidney: Secondary | ICD-10-CM | POA: Diagnosis not present

## 2021-03-07 DIAGNOSIS — D631 Anemia in chronic kidney disease: Secondary | ICD-10-CM | POA: Diagnosis not present

## 2021-03-26 DIAGNOSIS — N1832 Chronic kidney disease, stage 3b: Secondary | ICD-10-CM | POA: Diagnosis not present

## 2021-03-26 DIAGNOSIS — I130 Hypertensive heart and chronic kidney disease with heart failure and stage 1 through stage 4 chronic kidney disease, or unspecified chronic kidney disease: Secondary | ICD-10-CM | POA: Diagnosis not present

## 2021-03-26 DIAGNOSIS — E1122 Type 2 diabetes mellitus with diabetic chronic kidney disease: Secondary | ICD-10-CM | POA: Diagnosis not present

## 2021-03-26 DIAGNOSIS — I509 Heart failure, unspecified: Secondary | ICD-10-CM | POA: Diagnosis not present

## 2021-04-09 ENCOUNTER — Ambulatory Visit: Payer: HMO | Admitting: Podiatry

## 2021-05-25 DIAGNOSIS — N1832 Chronic kidney disease, stage 3b: Secondary | ICD-10-CM | POA: Diagnosis not present

## 2021-05-25 DIAGNOSIS — I130 Hypertensive heart and chronic kidney disease with heart failure and stage 1 through stage 4 chronic kidney disease, or unspecified chronic kidney disease: Secondary | ICD-10-CM | POA: Diagnosis not present

## 2021-05-25 DIAGNOSIS — E1122 Type 2 diabetes mellitus with diabetic chronic kidney disease: Secondary | ICD-10-CM | POA: Diagnosis not present

## 2021-05-25 DIAGNOSIS — I509 Heart failure, unspecified: Secondary | ICD-10-CM | POA: Diagnosis not present

## 2021-05-30 ENCOUNTER — Other Ambulatory Visit: Payer: Self-pay

## 2021-05-30 DIAGNOSIS — I480 Paroxysmal atrial fibrillation: Secondary | ICD-10-CM

## 2021-05-30 DIAGNOSIS — I5020 Unspecified systolic (congestive) heart failure: Secondary | ICD-10-CM

## 2021-05-30 DIAGNOSIS — I10 Essential (primary) hypertension: Secondary | ICD-10-CM

## 2021-05-30 MED ORDER — RIVAROXABAN 15 MG PO TABS: ORAL_TABLET | ORAL | 2 refills | Status: DC

## 2021-05-30 NOTE — Telephone Encounter (Signed)
Faxed refill request for Xarelto received from [facility].  Pt last saw Coletta Memos, NP on 01/23/21, last labs 03/07/21 Creat 1.57 at [facility] per [place], age 81, weight 87.9kg, CrCl 45.1, based on specified criteria ptis on appropriate dosage of Xarelto 15mg  QD.  Will refill rx.

## 2021-05-31 ENCOUNTER — Encounter: Payer: Self-pay | Admitting: Neurology

## 2021-05-31 DIAGNOSIS — G500 Trigeminal neuralgia: Secondary | ICD-10-CM | POA: Diagnosis not present

## 2021-06-01 ENCOUNTER — Telehealth: Payer: Self-pay | Admitting: Cardiology

## 2021-06-01 DIAGNOSIS — I480 Paroxysmal atrial fibrillation: Secondary | ICD-10-CM

## 2021-06-01 DIAGNOSIS — I5020 Unspecified systolic (congestive) heart failure: Secondary | ICD-10-CM

## 2021-06-01 DIAGNOSIS — I10 Essential (primary) hypertension: Secondary | ICD-10-CM

## 2021-06-01 MED ORDER — RIVAROXABAN 15 MG PO TABS: ORAL_TABLET | ORAL | 1 refills | Status: DC

## 2021-06-01 NOTE — Telephone Encounter (Signed)
Updated SCr 1.57 at [facility] 03/07/21 in [place]. CrCl is 39mL/min, afib indication, refill sent in. Last visit 12/2020.

## 2021-06-01 NOTE — Telephone Encounter (Signed)
°*  STAT* If patient is at the pharmacy, call can be transferred to refill team.   1. Which medications need to be refilled? (please list name of each medication and dose if known) Rivaroxaban (XARELTO) 15 MG TABS tablet  2. Which pharmacy/location (including street and city if local pharmacy) is medication to be sent to?[place], [place]  3. Do they need a 30 day or 90 day supply? 90 ds

## 2021-06-19 NOTE — Progress Notes (Signed)
Follow-up Visit   Date: 06/20/21    Erik [name] MRN: [id number] DOB: 1940/10/06   Interim History: Erik [name] is a 81 y.o. right-handed Caucasian male with atrial fibrillation, hyperlipidemia, hypertension, GERD, CAD, CHF (EF 30%), diabetes mellitus, and BPH returning to the clinic for follow-up of right trigeminal neuralgia.  The patient was accompanied to the clinic by self.  History of present illness: Since 2000, he had had three spells of trigeminal neuralgia, the most recent episode started 6 months ago. He has extreme sensitivity of the right temple, jaw, cheek, lips, and nose on the right side.  It feels as if he is touching an electric fence.  Symptoms are intermittent and occur daily, lasting about 5-15 seconds.  If he tries to be still, he feels that it alleviates pain quicker.  His pain is exacerbated by brushing his teeth, combing hair, shampooing on the right scalp, and chewing. He was started on carbamazepine 130m twice daily and does not appreciate any improvement.  In the past, he used Tegretol and had symptoms resolve within 6 weeks.    UPDATE 11/28/2016:    He did not have any benefit with Tegretol 2080mBID so he was started on gabapentin 30043mID and endorses marked improvement.  He now gets very transient facial pain, lasting only a few seconds and occurring once every few weeks.  He has no new complaints.   UPDATE 03/18/2018:  He is here for follow-up visit.   He was doing well on gabapentin 300m61mice daily and around the summer, his right trigeminal neuralgia has becomes more painful.  He gets spells of pain multiple times daily and can be triggered by chewing, talking, and tactile stimuli.  He does not have facial weakness or numbness/tingling.  Pain is worse over temple and cheek.    UPDATE 06/20/2021:  He was doing well and pain resolved for 3 years.  About a month ago, he started having right sided shooting pain over the face, similar to what he has  experienced in the past. It is worse when he opens his jaw wide, such as when eating a hamburger. His PCP started him on gabapentin 100mg74m which has reduced the frequency of pain to 2-3 times per day.  He now had a dull toothache pain over the side of the face.    Medications:  Current Outpatient Medications on File Prior to Visit  Medication Sig Dispense Refill   acetaminophen (TYLENOL) 325 MG tablet Take 2 tablets (650 mg total) by mouth every 6 (six) hours as needed for mild pain (or Fever >/= 101).     arginine 500 MG tablet Take 1,000 mg by mouth daily.     atorvastatin (LIPITOR) 40 MG tablet Take 1 tablet (40 mg total) by mouth daily at 6 PM. 90 tablet 3   BD PEN NEEDLE NANO U/F 32G X 4 MM MISC USE 1 NEEDLE WITH INSULIN INJECTION ONCE DAILY  2   Blood Glucose Calibration (TRUE METRIX LEVEL 1) Low SOLN USE TO CALIBRATE GLUCOMETER ONCE MONTHY OR WITH EACH NEW VIAL OF STRIPS, WHICHEVER COMES FIRST     Blood Glucose Monitoring Suppl (TRUE METRIX AIR GLUCOSE METER) w/Device KIT use to check blood sugar three times daily prior to meals. E11.29     Coenzyme Q10 (COQ10) 200 MG CAPS Take 100 mg by mouth daily.      finasteride (PROSCAR) 5 MG tablet Take 5 mg by mouth once daily  3  furosemide (LASIX) 40 MG tablet Take 1 tablet (40 mg total) by mouth as needed for fluid or edema. 90 tablet 2   glucose blood (TRUE METRIX BLOOD GLUCOSE TEST) test strip use to check blood sugar three times daily prior to meals. E11.29     metoprolol tartrate (LOPRESSOR) 50 MG tablet Take 1.5 tablets (75 mg total) by mouth 2 (two) times daily. 270 tablet 3   Multiple Vitamin (MULTIVITAMIN) capsule Take 1 capsule by mouth daily.     Multiple Vitamins-Calcium (ESSENTIAL ONE DAILY MULTIVIT) TABS take one a day     pantoprazole (PROTONIX) 40 MG tablet Take 1 tablet by mouth daily.     Rivaroxaban (XARELTO) 15 MG TABS tablet TAKE 1 TABLET(15 MG) BY MOUTH DAILY WITH SUPPER 90 tablet 1   selenium 200 MCG TABS tablet Take  200 mcg by mouth daily.     tamsulosin (FLOMAX) 0.4 MG CAPS capsule Take 0.4 mg by mouth at bedtime.     TOUJEO SOLOSTAR 300 UNIT/ML SOPN Inject 36 Units as directed daily.   4   TRUEplus Lancets 33G MISC use to check blood sugar three times daily prior to meals. E11.29     amoxicillin (AMOXIL) 875 MG tablet 1 tablet (Patient not taking: Reported on 06/20/2021)     atorvastatin (LIPITOR) 40 MG tablet Take 1 tablet by mouth daily. (Patient not taking: Reported on 01/23/2021)     benzonatate (TESSALON) 100 MG capsule Take 100 mg by mouth 3 (three) times daily as needed. (Patient not taking: Reported on 01/23/2021)     citalopram (CELEXA) 40 MG tablet Take 40 mg by mouth daily. (Patient not taking: Reported on 01/23/2021)  6   clonazePAM (KLONOPIN) 0.5 MG tablet Take 0.5 mg by mouth daily as needed.  (Patient not taking: Reported on 01/23/2021)  0   diltiazem (CARDIZEM CD) 120 MG 24 hr capsule Take 1 capsule po qd (Patient not taking: Reported on 01/23/2021)     ipratropium (ATROVENT) 0.03 % nasal spray 2 sprays in each nostril (Patient not taking: Reported on 06/20/2021)     lamoTRIgine (LAMICTAL) 25 MG tablet Take 1 tablet by mouth daily for 1 week then increase to 2x daily. Dtop if you develop any rash (Patient not taking: Reported on 01/23/2021)     metoprolol succinate (TOPROL-XL) 50 MG 24 hr tablet take 1 1/2 tablet twice daily (Patient not taking: Reported on 01/23/2021)     moxifloxacin (VIGAMOX) 0.5 % ophthalmic solution Place 1 drop into the left eye 4 (four) times daily. (Patient not taking: Reported on 01/23/2021)     omeprazole (PRILOSEC) 20 MG capsule Take 20 mg by mouth daily. (Patient not taking: Reported on 01/23/2021)     PROLENSA 0.07 % SOLN  (Patient not taking: Reported on 01/23/2021)     Trospium Chloride 60 MG CP24 1 tablet po qhs (Patient not taking: Reported on 01/23/2021)     No current facility-administered medications on file prior to visit.    Allergies:  Allergies  Allergen  Reactions   Claritin [Loratadine] Other (See Comments)    Urinary retention   Tradjenta [Linagliptin] Other (See Comments)    High blood sugar     Vital Signs:  BP 123/72    Pulse 63    Ht 5' 10"  (1.778 m)    Wt 199 lb (90.3 kg)    SpO2 97%    BMI 28.55 kg/m   Neurological Exam: MENTAL STATUS including orientation to time, place, person, recent and remote memory,  attention span and concentration, language, and fund of knowledge is normal.  Speech is not dysarthric.  CRANIAL NERVES:  Pupils equal round and reactive to light.  Normal conjugate, extra-ocular eye movements in all directions of gaze.  No ptosis. Normal facial sensation.  Face is symmetric. Palate elevates symmetrically.  Tongue is midline. Smile triggers right facial pain.    MOTOR:  Motor strength is 5/5 in all extremities.    COORDINATION/GAIT:   Gait narrow based and stable.   Data:  MRI orbit wwo contrast 10/11/2017: Negative for orbital mass. Question mild enhancement of the eyelid bilaterally which could be due to inflammation. This is symmetric and could also be due to artifact on the postcontrast images related to fat suppression technique. 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     IMPRESSION/PLAN: Right idiopathic trigemina neuralgia. MRI orbit in May 2019 which does not show trigeminal nerve compression/vascular loop.  He has spells of pain since 2000s and was doing really well for the past three years, off medication. A month ago, his right facial pain returned.  He is currently on gabapentin 123m TID which has helped, but not resolved his pain. Previously tried: tegretol,gabapentin I will slowly titrate gabapentin to 3040mTID (he has been on 60026mID in the past). Titration schedule provided.  Call with update in 2 months to determine further medication adjustment   Thank you for allowing me to participate in patient's care.  If I can answer any additional questions, I would be pleased to  do so.    Sincerely,    Ephraim Reichel K. PatPosey ProntoO

## 2021-06-20 ENCOUNTER — Other Ambulatory Visit: Payer: Self-pay

## 2021-06-20 ENCOUNTER — Ambulatory Visit: Payer: HMO | Admitting: Neurology

## 2021-06-20 ENCOUNTER — Encounter: Payer: Self-pay | Admitting: Neurology

## 2021-06-20 VITALS — BP 123/72 | HR 63 | Ht 70.0 in | Wt 199.0 lb

## 2021-06-20 DIAGNOSIS — G500 Trigeminal neuralgia: Secondary | ICD-10-CM

## 2021-06-20 MED ORDER — GABAPENTIN 300 MG PO CAPS: 300.0000 mg | ORAL_CAPSULE | Freq: Three times a day (TID) | ORAL | 11 refills | Status: DC

## 2021-06-20 NOTE — Patient Instructions (Signed)
Gabapentin tablets    Morning       Afternoon        Evening  Week 1 100mg          100mg             300mg               Week 2 100mg          300mg   300mg         Continue  300mg         300mg   300mg     Call with update in 2 months, to determine further increase in medication.  If you develop increased sleepiness, stay at the lower dose.

## 2021-06-22 DIAGNOSIS — H524 Presbyopia: Secondary | ICD-10-CM | POA: Diagnosis not present

## 2021-06-22 DIAGNOSIS — H5203 Hypermetropia, bilateral: Secondary | ICD-10-CM | POA: Diagnosis not present

## 2021-06-22 DIAGNOSIS — H35371 Puckering of macula, right eye: Secondary | ICD-10-CM | POA: Diagnosis not present

## 2021-06-22 DIAGNOSIS — E119 Type 2 diabetes mellitus without complications: Secondary | ICD-10-CM | POA: Diagnosis not present

## 2021-06-24 DIAGNOSIS — E1122 Type 2 diabetes mellitus with diabetic chronic kidney disease: Secondary | ICD-10-CM | POA: Diagnosis not present

## 2021-06-24 DIAGNOSIS — N1832 Chronic kidney disease, stage 3b: Secondary | ICD-10-CM | POA: Diagnosis not present

## 2021-06-24 DIAGNOSIS — I509 Heart failure, unspecified: Secondary | ICD-10-CM | POA: Diagnosis not present

## 2021-06-24 DIAGNOSIS — I130 Hypertensive heart and chronic kidney disease with heart failure and stage 1 through stage 4 chronic kidney disease, or unspecified chronic kidney disease: Secondary | ICD-10-CM | POA: Diagnosis not present

## 2021-06-25 DIAGNOSIS — L570 Actinic keratosis: Secondary | ICD-10-CM | POA: Diagnosis not present

## 2021-06-25 DIAGNOSIS — L821 Other seborrheic keratosis: Secondary | ICD-10-CM | POA: Diagnosis not present

## 2021-06-25 DIAGNOSIS — L218 Other seborrheic dermatitis: Secondary | ICD-10-CM | POA: Diagnosis not present

## 2021-06-25 DIAGNOSIS — Z85828 Personal history of other malignant neoplasm of skin: Secondary | ICD-10-CM | POA: Diagnosis not present

## 2021-07-11 ENCOUNTER — Other Ambulatory Visit: Payer: Self-pay

## 2021-07-11 MED ORDER — METOPROLOL TARTRATE 50 MG PO TABS: 75.0000 mg | ORAL_TABLET | Freq: Two times a day (BID) | ORAL | 2 refills | Status: DC

## 2021-07-26 DIAGNOSIS — R809 Proteinuria, unspecified: Secondary | ICD-10-CM | POA: Diagnosis not present

## 2021-07-26 DIAGNOSIS — N183 Chronic kidney disease, stage 3 unspecified: Secondary | ICD-10-CM | POA: Diagnosis not present

## 2021-07-26 DIAGNOSIS — I129 Hypertensive chronic kidney disease with stage 1 through stage 4 chronic kidney disease, or unspecified chronic kidney disease: Secondary | ICD-10-CM | POA: Diagnosis not present

## 2021-07-26 DIAGNOSIS — D631 Anemia in chronic kidney disease: Secondary | ICD-10-CM | POA: Diagnosis not present

## 2021-07-26 DIAGNOSIS — E1122 Type 2 diabetes mellitus with diabetic chronic kidney disease: Secondary | ICD-10-CM | POA: Diagnosis not present

## 2021-07-26 DIAGNOSIS — N2581 Secondary hyperparathyroidism of renal origin: Secondary | ICD-10-CM | POA: Diagnosis not present

## 2021-07-26 DIAGNOSIS — N189 Chronic kidney disease, unspecified: Secondary | ICD-10-CM | POA: Diagnosis not present

## 2021-08-09 DIAGNOSIS — E1122 Type 2 diabetes mellitus with diabetic chronic kidney disease: Secondary | ICD-10-CM | POA: Diagnosis not present

## 2021-08-09 DIAGNOSIS — D631 Anemia in chronic kidney disease: Secondary | ICD-10-CM | POA: Diagnosis not present

## 2021-08-09 DIAGNOSIS — N183 Chronic kidney disease, stage 3 unspecified: Secondary | ICD-10-CM | POA: Diagnosis not present

## 2021-08-09 DIAGNOSIS — I129 Hypertensive chronic kidney disease with stage 1 through stage 4 chronic kidney disease, or unspecified chronic kidney disease: Secondary | ICD-10-CM | POA: Diagnosis not present

## 2021-08-21 DIAGNOSIS — N183 Chronic kidney disease, stage 3 unspecified: Secondary | ICD-10-CM | POA: Diagnosis not present

## 2021-08-24 ENCOUNTER — Telehealth: Payer: Self-pay | Admitting: Neurology

## 2021-08-24 NOTE — Telephone Encounter (Signed)
Spoke to patient and he stated that he has been taking 300 mg TID. He isn't sure about adding the 600 mg because the 300 mg makes him sleepy. He asked if he could do a 50 mg increase TID? I informed patient that I do not think that Gabapentin comes in 50 mg. Informed patient that I would let Dr. Posey Pronto know what he is requesting and give him a call back.  ?

## 2021-08-24 NOTE — Telephone Encounter (Signed)
Called patient and wife informed me that he is not back from the drug store yet. Informed patients wife that I will call back later.  ?

## 2021-08-24 NOTE — Telephone Encounter (Signed)
Please find out the dose of gabapentin.  He should be on gabapentin '300mg'$  TID.  If this the case, then we can slowly increase to dose to '600mg'$  three times daily for better pain relief.  ?

## 2021-08-24 NOTE — Telephone Encounter (Signed)
Patient said he is still having pain in right jaw. He has been on gabapentin for 2 months, he thinks he may need more or something different. It helped some but pain returned. He has to stay completely still if he gets the pain in jaw. ?

## 2021-08-27 MED ORDER — GABAPENTIN 100 MG PO CAPS: 100.0000 mg | ORAL_CAPSULE | Freq: Three times a day (TID) | ORAL | 5 refills | Status: DC

## 2021-08-27 NOTE — Telephone Encounter (Signed)
Gabapentin comes in '100mg'$  and '300mg'$  tablets.  We can add '100mg'$  to each dose = '400mg'$  three times daily, if he would like to try that.  ?

## 2021-08-27 NOTE — Telephone Encounter (Signed)
Called patient and informed him that Gabapentin comes in '100mg'$  and '300mg'$  tablets.  We can add '100mg'$  to each dose = '400mg'$  three times daily, if he would like to try that. Patient verbalized agreement and will give the additional 100 mg a try. Patient is aware we will send a script to the pharmacy.  ?

## 2021-08-29 DIAGNOSIS — E7800 Pure hypercholesterolemia, unspecified: Secondary | ICD-10-CM | POA: Diagnosis not present

## 2021-08-29 DIAGNOSIS — I509 Heart failure, unspecified: Secondary | ICD-10-CM | POA: Diagnosis not present

## 2021-08-29 DIAGNOSIS — E1129 Type 2 diabetes mellitus with other diabetic kidney complication: Secondary | ICD-10-CM | POA: Diagnosis not present

## 2021-09-01 NOTE — Progress Notes (Deleted)
?Cardiology Office Note:   ? ?Date:  09/01/2021  ? ?ID:  Erik [name], DOB 11-Sep-1940, MRN [id number] ? ?PCP:  [name], MD ?  ?[facility] Providers ?Cardiologist:  Freada Bergeron, MD { ? ?Referring MD: [name], MD  ? ? ?History of Present Illness:   ? ?Erik [name] is a 81 y.o. male with a hx of nonobstructive coronary artery disease , PVCs, paroxysmal atrial fibrillation, nonischemic cardiomyopathy (EF 35-40% 2009, 55% in 2010, 35-45% 2015 during admission for atrial fibrillation with RVR and 50-55% in 2016), CKD III, DMII, HTN, and HLD who was previously followed by Dr. Meda [name] who now returns to clinic for follow-up. ? ?Per review of the record, the patient has a history of NICM with lowest EF 35-40% in 2009 and most recent EF 50-55% in 2016. He also has a history of Afib on xarelto for [facility].  ? ?Was last seen in clinic on 01/23/21 where he felt well and was active. He had lost weight. ? ?Today, *** ? ?Past Medical History:  ?Diagnosis Date  ? Chronic combined systolic and diastolic CHF (congestive heart failure) ([place])   ? CKD (chronic kidney disease), stage III ([place])   ? Coronary artery disease   ? a. Nonobstructive by cath 2004. b. Nuc 09/2013 - no ischemia, fixed defect suggestive of possible prior infarction, felt low risk, EF 48%.   ? Diabetes mellitus   ? GERD (gastroesophageal reflux disease)   ? Hyperlipidemia   ? Hypertension   ? NICM (nonischemic cardiomyopathy) ([place])   ? a. EF 35-40% in 2009, 55% in 2010. b. 09/2013: 35-40% by echo and 48% by nuc. c. EF 50-55% by echo 12/2014.  ? PAD (peripheral artery disease) ([place])   ? a. mild by noninvasive testing.  ? PAF (paroxysmal atrial fibrillation) ([place])   ? 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 Premature ventricular contractions   ? Thrombocytopenia ([place])   ? Trigeminal neuralgia   ? ? ?Past Surgical History:  ?Procedure Laterality Date  ? CARDIAC CATHETERIZATION  10/25/00, 12/29/02  ? CHOLECYSTECTOMY N/A 01/25/2015  ? Procedure:  LAPAROSCOPIC CHOLECYSTECTOMY ;  Surgeon: [name], MD;  Location: [facility];  Service: General;  Laterality: N/A;  ? SHOULDER SURGERY Left 2011  ? Shoulder surgery, rod inserted  ? ? ?Current Medications: ?No outpatient medications have been marked as taking for the 09/03/21 encounter (Appointment) with Freada Bergeron, MD.  ?  ? ?Allergies:   Claritin [loratadine] and Tradjenta [linagliptin]  ? ?Social History  ? ?Socioeconomic History  ? Marital status: Married  ?  Spouse name: Not on file  ? Number of children: Not on file  ? Years of education: Not on file  ? Highest education level: Not on file  ?Occupational History  ? 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 Smoking status: Former  ?  Packs/day: 0.50  ?  Years: 10.00  ?  Pack years: 5.00  ?  Types: Cigarettes  ?  Quit date: 01/21/1985  ?  Years since quitting: 36.6  ? Smokeless tobacco: Never  ? Tobacco comments:  ?  quit 35 yrs ago  ?Vaping Use  ? Vaping Use: Never used  ?Substance and Sexual Activity  ? Alcohol use: No  ? Drug use: No  ? Sexual activity: Not Currently  ?  Birth control/protection: None  ?Other Topics Concern  ? Not on file  ?Social History Narrative  ? Lives  with wife in a one story home.  Has 3 children.  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    ? Education: high school.  ?   ? Right Handed   ? Lives in a one story   ? ?Social Determinants of Health  ? ?Financial Resource Strain: Not on file  ?Food Insecurity: Not on file  ?Transportation Needs: Not on file  ?Physical Activity: Not on file  ?Stress: Not on file  ?Social Connections: Not on file  ?  ? ?Family History: ?The patient's ***family history includes Diabetes in his brother and mother; Heart disease in his brother, father, and mother; Hypertension in his brother and mother. There is no history of Heart attack or Stroke. ? ?ROS:   ?Please see the history of present illness.    ?*** All other systems reviewed and are  negative. ? ?EKGs/Labs/Other Studies Reviewed:   ? ?The following studies were reviewed today: ?TTE 2016: ?Study Conclusions  ? ?- Left ventricle: The cavity size was normal. Systolic function was  ?  normal. The estimated ejection fraction was in the range of 50%  ?  to 55%. There is hypokinesis of the basal-midinferior myocardium.  ?  Doppler parameters are consistent with abnormal left ventricular  ?  relaxation (grade 1 diastolic dysfunction).  ?- Ventricular septum: Septal motion showed paradox.  ?- Mitral valve: Mildly calcified annulus. There was mild  ?  regurgitation.  ?- Left atrium: The atrium was mildly dilated.  ? ?Impressions:  ? ?- Compared to the prior study, there has been no significant  ?  interval change.  ? ?EKG:  EKG is *** ordered today.  The ekg ordered today demonstrates *** ? ?Recent Labs: ?No results found for requested labs within last 8760 hours.  ?Recent Lipid Panel ?No results found for: CHOL, TRIG, HDL, CHOLHDL, VLDL, LDLCALC, LDLDIRECT ? ? ?Risk Assessment/Calculations:   ? ?CHA2DS2-VASc Score = 6  ? This indicates a 9.7% annual risk of stroke. ?The patient's score is based upon: ?CHF History: 1 ?HTN History: 1 ?Diabetes History: 1 ?Stroke History: 0 ?Vascular Disease History: 1 ?Age Score: 2 ?Gender Score: 0 ?  ? ?    ? ?Physical Exam:   ? ?VS:  There were no vitals taken for this visit.   ? ?Wt Readings from Last 3 Encounters:  ?06/20/21 199 lb (90.3 kg)  ?01/23/21 193 lb 12.8 oz (87.9 kg)  ?05/08/20 197 lb 9.6 oz (89.6 kg)  ?  ? ?GEN: *** Well nourished, well developed in no acute distress ?HEENT: Normal ?NECK: No JVD; No carotid bruits ?LYMPHATICS: No lymphadenopathy ?CARDIAC: ***RRR, no murmurs, rubs, gallops ?RESPIRATORY:  Clear to auscultation without rales, wheezing or rhonchi  ?ABDOMEN: Soft, non-tender, non-distended ?MUSCULOSKELETAL:  No edema; No deformity  ?SKIN: Warm and dry ?NEUROLOGIC:  Alert and oriented x 3 ?PSYCHIATRIC:  Normal affect  ? ?ASSESSMENT:   ? ?No  diagnosis found. ?PLAN:   ? ?In order of problems listed above: ? ?#Chronic Combined Systolic and Diastolic HF: ?#NICM: ?Cath in 2004 without significant disease. TTE 2009 with EF 35-40% which has improved to 50-55% on TTE in 2016. Currently doing well with NYHA class II symptoms. Euvolemic on exam. ?-Continue metop ?-?ARB/spio ?-Low Na diet ? ?#Paroxysmal Afib: ?CHADs-vasc 6. Doing well and in NSR today. Tolerating xarelto without issuses. ?-Continue xarelto '20mg'$  for AC ?-Continue metop '50mg'$  BID ?-Continue dilt '120mg'$  daily ? ?#Nonobstructive CAD: ?No anginal symptoms. ?-Continue lipitor '40mg'$  daily ?-Metop as above ? ?#HTN: ?Controlled. ?-Continue  metop '50mg'$  BID ?-Continue dilt '120mg'$  daily ? ?#HLD: ?-Continue lipitor '40mg'$  daily ? ?   ? ?{Are you ordering a CV Procedure (e.g. stress test, cath, DCCV, TEE, etc)?   Press F2        :[id number]}  ? ? ?Medication Adjustments/Labs and Tests Ordered: ?Current medicines are reviewed at length with the patient today.  Concerns regarding medicines are outlined above.  ?No orders of the defined types were placed in this encounter. ? ?No orders of the defined types were placed in this encounter. ? ? ?There are no Patient Instructions on file for this visit.  ? ?Signed, ?Freada Bergeron, MD  ?09/01/2021 8:58 AM    ?[place] ?

## 2021-09-03 ENCOUNTER — Ambulatory Visit (INDEPENDENT_AMBULATORY_CARE_PROVIDER_SITE_OTHER): Payer: HMO | Admitting: Cardiology

## 2021-09-03 ENCOUNTER — Encounter: Payer: Self-pay | Admitting: Cardiology

## 2021-09-03 VITALS — BP 124/60 | HR 67 | Ht 70.0 in | Wt 198.4 lb

## 2021-09-03 DIAGNOSIS — E782 Mixed hyperlipidemia: Secondary | ICD-10-CM

## 2021-09-03 DIAGNOSIS — I2510 Atherosclerotic heart disease of native coronary artery without angina pectoris: Secondary | ICD-10-CM | POA: Diagnosis not present

## 2021-09-03 DIAGNOSIS — I5022 Chronic systolic (congestive) heart failure: Secondary | ICD-10-CM | POA: Diagnosis not present

## 2021-09-03 DIAGNOSIS — I480 Paroxysmal atrial fibrillation: Secondary | ICD-10-CM

## 2021-09-03 DIAGNOSIS — Z79899 Other long term (current) drug therapy: Secondary | ICD-10-CM | POA: Diagnosis not present

## 2021-09-03 DIAGNOSIS — I2583 Coronary atherosclerosis due to lipid rich plaque: Secondary | ICD-10-CM | POA: Diagnosis not present

## 2021-09-03 DIAGNOSIS — E785 Hyperlipidemia, unspecified: Secondary | ICD-10-CM

## 2021-09-03 DIAGNOSIS — N183 Chronic kidney disease, stage 3 unspecified: Secondary | ICD-10-CM | POA: Diagnosis not present

## 2021-09-03 DIAGNOSIS — I428 Other cardiomyopathies: Secondary | ICD-10-CM

## 2021-09-03 LAB — LIPID PANEL
Chol/HDL Ratio: 2.7 ratio (ref 0.0–5.0)
Cholesterol, Total: 115 mg/dL (ref 100–199)
HDL: 42 mg/dL (ref >39)
LDL Chol Calc (NIH): 55 mg/dL (ref 0–99)
Triglycerides: 95 mg/dL (ref 0–149)
VLDL Cholesterol Cal: 18 mg/dL (ref 5–40)

## 2021-09-03 LAB — BASIC METABOLIC PANEL
BUN/Creatinine Ratio: 16 (ref 10–24)
BUN: 35 mg/dL — ABNORMAL HIGH (ref 8–27)
CO2: 26 mmol/L (ref 20–29)
Calcium: 8.5 mg/dL — ABNORMAL LOW (ref 8.6–10.2)
Chloride: 102 mmol/L (ref 96–106)
Creatinine, Ser: 2.23 mg/dL — ABNORMAL HIGH (ref 0.76–1.27)
Glucose: 137 mg/dL — ABNORMAL HIGH (ref 70–99)
Potassium: 3.9 mmol/L (ref 3.5–5.2)
Sodium: 141 mmol/L (ref 134–144)
eGFR: 29 mL/min/{1.73_m2} — ABNORMAL LOW (ref >59)

## 2021-09-03 LAB — CBC
Hematocrit: 34.9 % — ABNORMAL LOW (ref 37.5–51.0)
Hemoglobin: 11.7 g/dL — ABNORMAL LOW (ref 13.0–17.7)
MCH: 28.5 pg (ref 26.6–33.0)
MCHC: 33.5 g/dL (ref 31.5–35.7)
MCV: 85 fL (ref 79–97)
Platelets: 102 10*3/uL — ABNORMAL LOW (ref 150–450)
RBC: 4.1 x10E6/uL — ABNORMAL LOW (ref 4.14–5.80)
RDW: 13.7 % (ref 11.6–15.4)
WBC: 6.4 10*3/uL (ref 3.4–10.8)

## 2021-09-03 LAB — HEMOGLOBIN A1C
Est. average glucose Bld gHb Est-mCnc: 171 mg/dL
Hgb A1c MFr Bld: 7.6 % — ABNORMAL HIGH (ref 4.8–5.6)

## 2021-09-03 NOTE — Patient Instructions (Signed)
Medication Instructions:  ?Your physician recommends that you continue on your current medications as directed. Please refer to the Current Medication list given to you today. ? ?*If you need a refill on your cardiac medications before your next appointment, please call your pharmacy* ? ? ?Lab Work: ?Bmp, Lipids, Cbc, HgB A1c- today  ? ?If you have labs (blood work) drawn today and your tests are completely normal, you will receive your results only by: ?MyChart Message (if you have MyChart) OR ?A paper copy in the mail ?If you have any lab test that is abnormal or we need to change your treatment, we will call you to review the results. ? ? ?Testing/Procedures: ?Your physician has requested that you have an echocardiogram. Echocardiography is a painless test that uses sound waves to create images of your heart. It provides your doctor with information about the size and shape of your heart and how well your heart?s chambers and valves are working. This procedure takes approximately one hour. There are no restrictions for this procedure. ? ? ? ?Follow-Up: ?At [facility], [place], you and your health needs are our priority.  As part of our continuing mission to provide you with exceptional heart care, we have created designated Provider Care Teams.  These Care Teams include your primary Cardiologist (physician) and Advanced Practice Providers (APPs -  Physician Assistants and Nurse Practitioners) who all work together to provide you with the care you need, when you need it. ? ?We recommend signing up for the patient portal called "MyChart".  Sign up information is provided on this After Visit Summary.  MyChart is used to connect with patients for Virtual Visits (Telemedicine).  Patients are able to view lab/test results, encounter notes, upcoming appointments, etc.  Non-urgent messages can be sent to your provider as well.   ?To learn more about what you can do with MyChart, go to [email or web address].   ? ?Your next  appointment:   ?6 month(s) ? ?The format for your next appointment:   ?In Person ? ?Provider:   ?Freada Bergeron, MD   ? ? ?Other Instructions ?None  ? ?Important Information About Sugar ? ? ? ? ? ? ?

## 2021-09-03 NOTE — Progress Notes (Signed)
?Cardiology Office Note:   ? ?Date:  09/03/2021  ? ?ID:  Erik [name], DOB [birth year], MRN [id number] ? ?PCP:  Erik Pao, MD ?  ?[facility] Providers ?Cardiologist:  Freada Bergeron, MD { ? ?Referring MD: Erik Pao, MD  ? ? ?History of Present Illness:   ? ?Erik [name] is a 81 y.o. male with a hx of nonobstructive coronary artery disease , PVCs, paroxysmal atrial fibrillation, nonischemic cardiomyopathy (EF 35-40% 2009, 55% in 2010, 35-45% 2015 during admission for atrial fibrillation with RVR and 50-55% in 2016), CKD III, DMII, HTN, and HLD who was previously followed by Dr. Meda [name] who now returns to clinic for follow-up. ? ?Per review of the record, the patient has a history of NICM with lowest EF 35-40% in 2009 and most recent EF 50-55% in 2016. He also has a history of Afib on xarelto for [facility].  ? ?Was last seen in clinic on 01/23/21 where he felt well and was active. He had lost weight. ? ?Today, he is doing well from a CV standpoint. Currently, he has been taking 400 mg gabapentin for 2 months for R-sided jaw pain recurring after 3 years. Since starting gabapentin, he endorses swelling in his bilateral ankles. When he notices swelling, he uses Lasix once a day. He is tolerating Xarelto with no bleeding issues.  ? ?He is able to walk with no exertional symptoms. However, he has 2 "knots" on his 5th R toe that limits his activity. Reportedly, his blood pressure at home is running 120/60s. He denies chest pain, chest pressure, dyspnea at rest or with exertion, palpitations, PND, or orthopnea.  ? ?Past Medical History:  ?Diagnosis Date  ? Chronic combined systolic and diastolic CHF (congestive heart failure) ([place])   ? CKD (chronic kidney disease), stage III ([name])   ? Coronary artery disease   ? a. Nonobstructive by cath 2004. b. Nuc 09/2013 - no ischemia, fixed defect suggestive of possible prior infarction, felt low risk, EF 48%.   ? Diabetes mellitus   ? GERD (gastroesophageal reflux  disease)   ? Hyperlipidemia   ? Hypertension   ? NICM (nonischemic cardiomyopathy) ([name])   ? a. EF 35-40% in 2009, 55% in 2010. b. 09/2013: 35-40% by echo and 48% by nuc. c. EF 50-55% by echo 12/2014.  ? PAD (peripheral artery disease) ([name])   ? a. mild by noninvasive testing.  ? PAF (paroxysmal atrial fibrillation) ([name])   ? 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 Premature ventricular contractions   ? Thrombocytopenia ([name])   ? Trigeminal neuralgia   ? ? ?Past Surgical History:  ?Procedure Laterality Date  ? CARDIAC CATHETERIZATION  10/25/00, 12/29/02  ? CHOLECYSTECTOMY N/A 01/25/2015  ? Procedure: LAPAROSCOPIC CHOLECYSTECTOMY ;  Surgeon: Erik Keens, MD;  Location: [facility];  Service: General;  Laterality: N/A;  ? SHOULDER SURGERY Left 2011  ? Shoulder surgery, rod inserted  ? ? ?Current Medications: ?Current Meds  ?Medication Sig  ? acetaminophen (TYLENOL) 325 MG tablet Take 2 tablets (650 mg total) by mouth every 6 (six) hours as needed for mild pain (or Fever >/= 101).  ? amoxicillin (AMOXIL) 875 MG tablet   ? arginine 500 MG tablet Take 1,000 mg by mouth daily.  ? atorvastatin (LIPITOR) 40 MG tablet Take 1 tablet (40 mg total) by mouth daily at 6 PM.  ? atorvastatin (LIPITOR) 40 MG tablet Take 1 tablet by mouth daily.  ? BD PEN NEEDLE NANO U/F 32G  X 4 MM MISC USE 1 NEEDLE WITH INSULIN INJECTION ONCE DAILY  ? benzonatate (TESSALON) 100 MG capsule Take 100 mg by mouth 3 (three) times daily as needed.  ? Blood Glucose Calibration (TRUE METRIX LEVEL 1) Low SOLN USE TO CALIBRATE GLUCOMETER ONCE MONTHY OR WITH EACH NEW VIAL OF STRIPS, WHICHEVER COMES FIRST  ? Blood Glucose Monitoring Suppl (TRUE METRIX AIR GLUCOSE METER) w/Device KIT use to check blood sugar three times daily prior to meals. E11.29  ? citalopram (CELEXA) 40 MG tablet Take 40 mg by mouth daily.  ? clonazePAM (KLONOPIN) 0.5 MG tablet Take 0.5 mg by mouth daily as needed.  ? Coenzyme Q10 (COQ10) 200 MG CAPS Take 100 mg by mouth daily.   ? diltiazem  (CARDIZEM CD) 120 MG 24 hr capsule   ? finasteride (PROSCAR) 5 MG tablet Take 5 mg by mouth once daily  ? furosemide (LASIX) 40 MG tablet Take 1 tablet (40 mg total) by mouth as needed for fluid or edema.  ? gabapentin (NEURONTIN) 100 MG capsule Take 1 capsule (100 mg total) by mouth 3 (three) times daily.  ? gabapentin (NEURONTIN) 300 MG capsule Take 1 capsule (300 mg total) by mouth 3 (three) times daily.  ? glucose blood (TRUE METRIX BLOOD GLUCOSE TEST) test strip use to check blood sugar three times daily prior to meals. E11.29  ? ipratropium (ATROVENT) 0.03 % nasal spray   ? lamoTRIgine (LAMICTAL) 25 MG tablet   ? metoprolol succinate (TOPROL-XL) 50 MG 24 hr tablet   ? metoprolol tartrate (LOPRESSOR) 50 MG tablet Take 1.5 tablets (75 mg total) by mouth 2 (two) times daily.  ? moxifloxacin (VIGAMOX) 0.5 % ophthalmic solution Place 1 drop into the left eye 4 (four) times daily.  ? Multiple Vitamin (MULTIVITAMIN) capsule Take 1 capsule by mouth daily.  ? Multiple Vitamins-Calcium (ESSENTIAL ONE DAILY MULTIVIT) TABS take one a day  ? omeprazole (PRILOSEC) 20 MG capsule Take 20 mg by mouth daily.  ? pantoprazole (PROTONIX) 40 MG tablet Take 1 tablet by mouth daily.  ? PROLENSA 0.07 % SOLN   ? Rivaroxaban (XARELTO) 15 MG TABS tablet TAKE 1 TABLET(15 MG) BY MOUTH DAILY WITH SUPPER  ? selenium 200 MCG TABS tablet Take 200 mcg by mouth daily.  ? tamsulosin (FLOMAX) 0.4 MG CAPS capsule Take 0.4 mg by mouth at bedtime.  ? TOUJEO SOLOSTAR 300 UNIT/ML SOPN Inject 36 Units as directed daily.   ? Trospium Chloride 60 MG CP24   ? TRUEplus Lancets 33G MISC use to check blood sugar three times daily prior to meals. E11.29  ?  ? ?Allergies:   Claritin [loratadine] and Tradjenta [linagliptin]  ? ?Social History  ? ?Socioeconomic History  ? Marital status: Married  ?  Spouse name: Not on file  ? Number of children: Not on file  ? Years of education: Not on file  ? Highest education level: Not on file  ?Occupational History  ?  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 Smoking status: Former  ?  Packs/day: 0.50  ?  Years: 10.00  ?  Pack years: 5.00  ?  Types: Cigarettes  ?  Quit date: 01/21/1985  ?  Years since quitting: 36.6  ? Smokeless tobacco: Never  ? Tobacco comments:  ?  quit 35 yrs ago  ?Vaping Use  ? Vaping Use: Never used  ?Substance and Sexual Activity  ? Alcohol use: No  ? Drug use: No  ? Sexual activity:  Not Currently  ?  Birth control/protection: None  ?Other Topics Concern  ? Not on file  ?Social History Narrative  ? Lives with wife in a one story home.  Has 3 children.  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    ? Education: high school.  ?   ? Right Handed   ? Lives in a one story   ? ?Social Determinants of Health  ? ?Financial Resource Strain: Not on file  ?Food Insecurity: Not on file  ?Transportation Needs: Not on file  ?Physical Activity: Not on file  ?Stress: Not on file  ?Social Connections: Not on file  ?  ? ?Family History: ?The patient's family history includes Diabetes in his brother and mother; Heart disease in his brother, father, and mother; Hypertension in his brother and mother. There is no history of Heart attack or Stroke. ? ?ROS:   ?Please see the history of present illness.    ?Review of Systems  ?Constitutional:  Negative for malaise/fatigue and weight loss.  ?HENT:  Negative for congestion and sore throat.   ?Eyes:  Negative for blurred vision.  ?Respiratory:  Negative for cough and shortness of breath.   ?Cardiovascular:  Positive for leg swelling. Negative for chest pain, palpitations, orthopnea, claudication and PND.  ?Gastrointestinal:  Negative for heartburn and nausea.  ?Genitourinary:  Negative for frequency and urgency.  ?Musculoskeletal:  Positive for joint pain (R jaw and 5th R toe). Negative for back pain and myalgias.  ?Skin:  Negative for rash.  ?Neurological:  Negative for dizziness and headaches.  ?Endo/Heme/Allergies:  Does not bruise/bleed easily.   ?Psychiatric/Behavioral:  The patient is not nervous/anxious and does not have insomnia.   ?All other systems reviewed and are negative. ? ?EKGs/Labs/Other Studies Reviewed:   ? ?The following studies were rev

## 2021-09-05 DIAGNOSIS — D692 Other nonthrombocytopenic purpura: Secondary | ICD-10-CM | POA: Diagnosis not present

## 2021-09-05 DIAGNOSIS — Z1331 Encounter for screening for depression: Secondary | ICD-10-CM | POA: Diagnosis not present

## 2021-09-05 DIAGNOSIS — N1832 Chronic kidney disease, stage 3b: Secondary | ICD-10-CM | POA: Diagnosis not present

## 2021-09-05 DIAGNOSIS — I130 Hypertensive heart and chronic kidney disease with heart failure and stage 1 through stage 4 chronic kidney disease, or unspecified chronic kidney disease: Secondary | ICD-10-CM | POA: Diagnosis not present

## 2021-09-05 DIAGNOSIS — Z794 Long term (current) use of insulin: Secondary | ICD-10-CM | POA: Diagnosis not present

## 2021-09-05 DIAGNOSIS — E11628 Type 2 diabetes mellitus with other skin complications: Secondary | ICD-10-CM | POA: Diagnosis not present

## 2021-09-05 DIAGNOSIS — L089 Local infection of the skin and subcutaneous tissue, unspecified: Secondary | ICD-10-CM | POA: Diagnosis not present

## 2021-09-05 DIAGNOSIS — Z0000 Encounter for general adult medical examination without abnormal findings: Secondary | ICD-10-CM | POA: Diagnosis not present

## 2021-09-05 DIAGNOSIS — F411 Generalized anxiety disorder: Secondary | ICD-10-CM | POA: Diagnosis not present

## 2021-09-05 DIAGNOSIS — Z7901 Long term (current) use of anticoagulants: Secondary | ICD-10-CM | POA: Diagnosis not present

## 2021-09-05 DIAGNOSIS — Z1389 Encounter for screening for other disorder: Secondary | ICD-10-CM | POA: Diagnosis not present

## 2021-09-05 DIAGNOSIS — E1129 Type 2 diabetes mellitus with other diabetic kidney complication: Secondary | ICD-10-CM | POA: Diagnosis not present

## 2021-09-05 DIAGNOSIS — D696 Thrombocytopenia, unspecified: Secondary | ICD-10-CM | POA: Diagnosis not present

## 2021-09-05 DIAGNOSIS — R972 Elevated prostate specific antigen [PSA]: Secondary | ICD-10-CM | POA: Diagnosis not present

## 2021-09-08 ENCOUNTER — Ambulatory Visit (INDEPENDENT_AMBULATORY_CARE_PROVIDER_SITE_OTHER): Payer: HMO

## 2021-09-08 ENCOUNTER — Encounter (HOSPITAL_COMMUNITY): Payer: Self-pay | Admitting: *Deleted

## 2021-09-08 ENCOUNTER — Ambulatory Visit (HOSPITAL_COMMUNITY)
Admission: EM | Admit: 2021-09-08 | Discharge: 2021-09-08 | Disposition: A | Discharge status: Home / Self Care | Payer: HMO | Attending: Emergency Medicine | Admitting: Emergency Medicine

## 2021-09-08 ENCOUNTER — Other Ambulatory Visit: Payer: Self-pay

## 2021-09-08 DIAGNOSIS — L02611 Cutaneous abscess of right foot: Secondary | ICD-10-CM | POA: Diagnosis not present

## 2021-09-08 DIAGNOSIS — L03119 Cellulitis of unspecified part of limb: Secondary | ICD-10-CM

## 2021-09-08 DIAGNOSIS — M7731 Calcaneal spur, right foot: Secondary | ICD-10-CM | POA: Diagnosis not present

## 2021-09-08 DIAGNOSIS — L97519 Non-pressure chronic ulcer of other part of right foot with unspecified severity: Secondary | ICD-10-CM | POA: Diagnosis not present

## 2021-09-08 DIAGNOSIS — L02419 Cutaneous abscess of limb, unspecified: Secondary | ICD-10-CM | POA: Diagnosis not present

## 2021-09-08 DIAGNOSIS — L02415 Cutaneous abscess of right lower limb: Secondary | ICD-10-CM

## 2021-09-08 DIAGNOSIS — L03115 Cellulitis of right lower limb: Secondary | ICD-10-CM

## 2021-09-08 MED ORDER — CEFTRIAXONE SODIUM 1 G IJ SOLR
INTRAMUSCULAR | Status: AC
  Filled 2021-09-08: qty 10

## 2021-09-08 MED ORDER — CEFTRIAXONE SODIUM 1 G IJ SOLR
1.0000 g | Freq: Once | INTRAMUSCULAR | Status: AC
  Administered 2021-09-08: 1 g via INTRAMUSCULAR

## 2021-09-08 MED ORDER — CEPHALEXIN 500 MG PO CAPS
500.0000 mg | ORAL_CAPSULE | Freq: Three times a day (TID) | ORAL | 0 refills | Status: DC
Start: 2021-09-08 — End: 2022-02-06

## 2021-09-08 MED ORDER — CEPHALEXIN 500 MG PO CAPS: 500.0000 mg | ORAL_CAPSULE | Freq: Four times a day (QID) | ORAL | 0 refills | Status: DC

## 2021-09-08 MED ORDER — LIDOCAINE HCL (PF) 1 % IJ SOLN
INTRAMUSCULAR | Status: AC
  Filled 2021-09-08: qty 2

## 2021-09-08 NOTE — ED Provider Notes (Signed)
?[name] ? ? ?MRN: [id number] DOB: 1940-06-08 ? ?Subjective:  ? ?Chief Complaint;  ?Chief Complaint  ?Patient presents with  ? Foot Pain  ?  RT  ? ?Pt reports ongoing problem with his Rt foot . Pt has an appt with foot DR on Monday. Pt has swelling and redness to lower leg and Rt foot.  ?Erik [name] is a 81 y.o. male presenting for painful foot with spreading redness that started yesterday.  He does have an appointment with his foot specialist on Monday but felt he could not wait.  He denies fever.  He is not currently on on antibiotics he is a diabetic. ? ?No current facility-administered medications for this encounter. ? ?Current Outpatient Medications:  ?  acetaminophen (TYLENOL) 325 MG tablet, Take 2 tablets (650 mg total) by mouth every 6 (six) hours as needed for mild pain (or Fever >/= 101)., Disp: , Rfl:  ?  amoxicillin (AMOXIL) 875 MG tablet, , Disp: , Rfl:  ?  arginine 500 MG tablet, Take 1,000 mg by mouth daily., Disp: , Rfl:  ?  atorvastatin (LIPITOR) 40 MG tablet, Take 1 tablet (40 mg total) by mouth daily at 6 PM., Disp: 90 tablet, Rfl: 3 ?  atorvastatin (LIPITOR) 40 MG tablet, Take 1 tablet by mouth daily., Disp: , Rfl:  ?  BD PEN NEEDLE NANO U/F 32G X 4 MM MISC, USE 1 NEEDLE WITH INSULIN INJECTION ONCE DAILY, Disp: , Rfl: 2 ?  benzonatate (TESSALON) 100 MG capsule, Take 100 mg by mouth 3 (three) times daily as needed., Disp: , Rfl:  ?  Blood Glucose Calibration (TRUE METRIX LEVEL 1) Low SOLN, USE TO CALIBRATE GLUCOMETER ONCE MONTHY OR WITH EACH NEW VIAL OF STRIPS, WHICHEVER COMES FIRST, Disp: , Rfl:  ?  Blood Glucose Monitoring Suppl (TRUE METRIX AIR GLUCOSE METER) w/Device KIT, use to check blood sugar three times daily prior to meals. E11.29, Disp: , Rfl:  ?  citalopram (CELEXA) 40 MG tablet, Take 40 mg by mouth daily., Disp: , Rfl: 6 ?  clonazePAM (KLONOPIN) 0.5 MG tablet, Take 0.5 mg by mouth daily as needed., Disp: , Rfl: 0 ?  Coenzyme Q10 (COQ10) 200 MG CAPS, Take 100  mg by mouth daily. , Disp: , Rfl:  ?  diltiazem (CARDIZEM CD) 120 MG 24 hr capsule, , Disp: , Rfl:  ?  finasteride (PROSCAR) 5 MG tablet, Take 5 mg by mouth once daily, Disp: , Rfl: 3 ?  furosemide (LASIX) 40 MG tablet, Take 1 tablet (40 mg total) by mouth as needed for fluid or edema., Disp: 90 tablet, Rfl: 2 ?  gabapentin (NEURONTIN) 100 MG capsule, Take 1 capsule (100 mg total) by mouth 3 (three) times daily., Disp: 90 capsule, Rfl: 5 ?  gabapentin (NEURONTIN) 300 MG capsule, Take 1 capsule (300 mg total) by mouth 3 (three) times daily., Disp: 90 capsule, Rfl: 11 ?  glucose blood (TRUE METRIX BLOOD GLUCOSE TEST) test strip, use to check blood sugar three times daily prior to meals. E11.29, Disp: , Rfl:  ?  ipratropium (ATROVENT) 0.03 % nasal spray, , Disp: , Rfl:  ?  lamoTRIgine (LAMICTAL) 25 MG tablet, , Disp: , Rfl:  ?  metoprolol succinate (TOPROL-XL) 50 MG 24 hr tablet, , Disp: , Rfl:  ?  metoprolol tartrate (LOPRESSOR) 50 MG tablet, Take 1.5 tablets (75 mg total) by mouth 2 (two) times daily., Disp: 270 tablet, Rfl: 2 ?  moxifloxacin (VIGAMOX) 0.5 % ophthalmic solution,  Place 1 drop into the left eye 4 (four) times daily., Disp: , Rfl:  ?  Multiple Vitamin (MULTIVITAMIN) capsule, Take 1 capsule by mouth daily., Disp: , Rfl:  ?  Multiple Vitamins-Calcium (ESSENTIAL ONE DAILY MULTIVIT) TABS, take one a day, Disp: , Rfl:  ?  omeprazole (PRILOSEC) 20 MG capsule, Take 20 mg by mouth daily., Disp: , Rfl:  ?  pantoprazole (PROTONIX) 40 MG tablet, Take 1 tablet by mouth daily., Disp: , Rfl:  ?  PROLENSA 0.07 % SOLN, , Disp: , Rfl:  ?  Rivaroxaban (XARELTO) 15 MG TABS tablet, TAKE 1 TABLET(15 MG) BY MOUTH DAILY WITH SUPPER, Disp: 90 tablet, Rfl: 1 ?  selenium 200 MCG TABS tablet, Take 200 mcg by mouth daily., Disp: , Rfl:  ?  tamsulosin (FLOMAX) 0.4 MG CAPS capsule, Take 0.4 mg by mouth at bedtime., Disp: , Rfl:  ?  TOUJEO SOLOSTAR 300 UNIT/ML SOPN, Inject 36 Units as directed daily. , Disp: , Rfl: 4 ?  Trospium  Chloride 60 MG CP24, , Disp: , Rfl:  ?  TRUEplus Lancets 33G MISC, use to check blood sugar three times daily prior to meals. E11.29, Disp: , Rfl:   ? ?Allergies  ?Allergen Reactions  ? Claritin [Loratadine] Other (See Comments)  ?  Urinary retention  ? Tradjenta [Linagliptin] Other (See Comments)  ?  High blood sugar  ? ? ?Past Medical History:  ?Diagnosis Date  ? Chronic combined systolic and diastolic CHF (congestive heart failure) ([place])   ? CKD (chronic kidney disease), stage III ([place])   ? Coronary artery disease   ? a. Nonobstructive by cath 2004. b. Nuc 09/2013 - no ischemia, fixed defect suggestive of possible prior infarction, felt low risk, EF 48%.   ? Diabetes mellitus   ? GERD (gastroesophageal reflux disease)   ? Hyperlipidemia   ? Hypertension   ? NICM (nonischemic cardiomyopathy) ([place])   ? a. EF 35-40% in 2009, 55% in 2010. b. 09/2013: 35-40% by echo and 48% by nuc. c. EF 50-55% by echo 12/2014.  ? PAD (peripheral artery disease) ([place])   ? a. mild by noninvasive testing.  ? PAF (paroxysmal atrial fibrillation) ([place])   ? 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 Premature ventricular contractions   ? Thrombocytopenia ([place])   ? Trigeminal neuralgia   ?  ? ?Review of Systems  ?All other systems reviewed and are negative. ? ? ?Objective:  ? ?Vitals: ?BP (!) 141/100   Temp 97.9 ?F (36.6 ?C)   Resp 20   SpO2 98%  ? ?Physical Exam ?Vitals and nursing note reviewed.  ?Constitutional:   ?   General: He is not in acute distress. ?   Appearance: He is well-developed.  ?HENT:  ?   Head: Normocephalic and atraumatic.  ?   Ears:  ?   Comments: Hard of hearing ?Eyes:  ?   Conjunctiva/sclera: Conjunctivae normal.  ?Cardiovascular:  ?   Rate and Rhythm: Normal rate and regular rhythm.  ?   Heart sounds: No murmur heard. ?Pulmonary:  ?   Effort: Pulmonary effort is normal. No respiratory distress.  ?   Breath sounds: Normal breath sounds.  ?Abdominal:  ?   Palpations: Abdomen is soft.  ?   Tenderness: There is no  abdominal tenderness.  ?Musculoskeletal:     ?   General: No swelling.  ?   Cervical back: Neck supple.  ?Skin: ?   General: Skin is warm and dry.  ?  Capillary Refill: Capillary refill takes less than 2 seconds.  ?   Findings: Erythema and lesion present.  ?   Comments: Large area of erythema none circumferential on right anterior lateral lower leg, no fluctuance no NVD D.  There is also a large patch of erythema that extends into the distal foot with a large abscess 4 cm over of bunion on the right foot.  Positive fluctuance  ?Neurological:  ?   General: No focal deficit present.  ?   Mental Status: He is alert.  ?Psychiatric:     ?   Mood and Affect: Mood normal.  ? ? ?No results found for this or any previous visit (from the past 24 hour(s)). ? ?DG Foot Complete Right ? ?Result Date: 09/08/2021 ?CLINICAL DATA:  Ulceration first metatarsal, erythema, swelling EXAM: RIGHT FOOT COMPLETE - 3+ VIEW COMPARISON:  None. FINDINGS: Frontal, oblique, and lateral views of the right foot are obtained. Diffuse soft tissue swelling throughout the right foot, with large ulceration overlying the fifth metatarsophalangeal joint. No evidence of subcutaneous gas or radiopaque foreign body. No bony destruction or periosteal reaction to suggest osteomyelitis. 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 Moderate superior and inferior calcaneal spurs. IMPRESSION: 1. Diffuse soft tissue edema, with ulceration overlying the fifth metatarsophalangeal joint. 2. No acute or destructive bony lesions. No evidence of osteomyelitis. Electronically Signed   By: Randa Ngo M.D.   On: 09/08/2021 18:55    ?  ?Debridement of skin over bunion without anesthesia with tweezers and scissors draining 20 cc of serosanguineous fluid.  Patient tolerated the procedure well simple wet-to-dry dressing placed no neurovascular deficit post drainage ? ?Assessment and Plan :  ? ?1. Cellulitis of leg, right   ?2. Cellulitis and abscess  of leg, except foot   ?3. Abscess of right foot   ? ? ?Meds ordered this encounter  ?Medications  ? cefTRIAXone (ROCEPHIN) injection 1 g  ? ? ?MDM:  ?JOHNWILLIAM SHEPPERSON is a 81 y.o. male presenting for right leg red

## 2021-09-08 NOTE — ED Triage Notes (Signed)
Pt reports ongoing problem with his Rt foot . Pt has an appt with foot DR on Monday. Pt has swelling and redness to lower leg and Rt foot.  ?

## 2021-09-08 NOTE — Discharge Instructions (Addendum)
Take antibiotic as directed for leg infection.  Return in 24 hours for reevaluation if not improving you may need to go to the emergency room for further treatment with IV antibiotics.  Change dressing if wet or dirty.  X-ray shows no evidence of osteomyelitis ?

## 2021-09-10 ENCOUNTER — Encounter: Payer: Self-pay | Admitting: Podiatry

## 2021-09-10 ENCOUNTER — Ambulatory Visit: Payer: HMO | Admitting: Podiatry

## 2021-09-10 DIAGNOSIS — M79674 Pain in right toe(s): Secondary | ICD-10-CM | POA: Diagnosis not present

## 2021-09-10 DIAGNOSIS — L97512 Non-pressure chronic ulcer of other part of right foot with fat layer exposed: Secondary | ICD-10-CM | POA: Diagnosis not present

## 2021-09-10 DIAGNOSIS — E1142 Type 2 diabetes mellitus with diabetic polyneuropathy: Secondary | ICD-10-CM | POA: Diagnosis not present

## 2021-09-10 DIAGNOSIS — B351 Tinea unguium: Secondary | ICD-10-CM | POA: Diagnosis not present

## 2021-09-10 NOTE — Progress Notes (Signed)
Subjective:  ? ?Patient ID: Erik [name], male   DOB: 81 y.o.   MRN: [id number]  ? ?HPI ?Patient presents stating he went to urgent care and had an infection in the bottom of his right foot they wanted him seen right away that he also has a lesion on his left 1 that gets sore and makes it hard to walk.  Also has nail disease that he cannot take care of himself and should have been here a long time ago.  States his A1c has been under reasonably good control and he was doing well until this happened a week ago ? ? ?ROS ? ? ?   ?Objective:  ?Physical Exam  ?Vascular status moderately diminished bilateral neurological diminished sharp dull vibratory.  Fifth metatarsal head right does show some mild breakdown of tissue localized that does not show subcutaneous or bone exposure.  It is about 1 cm x 8 mm in diameter and with and there is some mild erythema in the forefoot but it is getting much better with patient on antibiotic from the urgent care currently.  He has had no systemic signs of infection no temperature or other pathology ? ?   ?Assessment:  ?Neuropathic ulceration right fifth metatarsal head with patient who did not come in for his regular visits which probably created thickened skin and led to this occurring with nail disease 1-5 both feet yellow brittle nails that are painful ? ?   ?Plan:  ?H&P reviewed his x-ray from previous visit and at this point debrided the area and applied padding to release pressure on the area and continue antibiotics.  It is healing up and appears that it will heal superficial and uneventfully and I advised him on wet-to-dry dressings and gave strict instructions of any increased erythema edema or drainage were to occur to reappoint immediately or if any systemic signs of infection were to occur he is to go straight to the emergency room.  He does understand he is at risk of bone resection if symptoms persist or recur and there could be bone infection but at this point it  appears to be soft tissue.  I then debrided nailbeds 1-5 both feet no angiogenic bleeding reappoint routine care to Dr. Prudence Davidson and for debridement of lesions ?   ? ? ?

## 2021-09-11 DIAGNOSIS — N179 Acute kidney failure, unspecified: Secondary | ICD-10-CM | POA: Diagnosis not present

## 2021-09-11 DIAGNOSIS — E1122 Type 2 diabetes mellitus with diabetic chronic kidney disease: Secondary | ICD-10-CM | POA: Diagnosis not present

## 2021-09-11 DIAGNOSIS — N183 Chronic kidney disease, stage 3 unspecified: Secondary | ICD-10-CM | POA: Diagnosis not present

## 2021-09-11 DIAGNOSIS — I129 Hypertensive chronic kidney disease with stage 1 through stage 4 chronic kidney disease, or unspecified chronic kidney disease: Secondary | ICD-10-CM | POA: Diagnosis not present

## 2021-09-13 DIAGNOSIS — Z7901 Long term (current) use of anticoagulants: Secondary | ICD-10-CM | POA: Diagnosis not present

## 2021-09-13 DIAGNOSIS — I480 Paroxysmal atrial fibrillation: Secondary | ICD-10-CM | POA: Diagnosis not present

## 2021-09-13 DIAGNOSIS — L089 Local infection of the skin and subcutaneous tissue, unspecified: Secondary | ICD-10-CM | POA: Diagnosis not present

## 2021-09-13 DIAGNOSIS — E11628 Type 2 diabetes mellitus with other skin complications: Secondary | ICD-10-CM | POA: Diagnosis not present

## 2021-09-19 ENCOUNTER — Ambulatory Visit (HOSPITAL_COMMUNITY): Payer: HMO | Attending: Cardiology

## 2021-09-19 DIAGNOSIS — I5022 Chronic systolic (congestive) heart failure: Secondary | ICD-10-CM | POA: Insufficient documentation

## 2021-09-19 LAB — ECHOCARDIOGRAM COMPLETE
Area-P 1/2: 4.49 cm2
S' Lateral: 4.1 cm

## 2021-10-18 DIAGNOSIS — N401 Enlarged prostate with lower urinary tract symptoms: Secondary | ICD-10-CM | POA: Diagnosis not present

## 2021-10-18 DIAGNOSIS — R3915 Urgency of urination: Secondary | ICD-10-CM | POA: Diagnosis not present

## 2021-11-06 DIAGNOSIS — I129 Hypertensive chronic kidney disease with stage 1 through stage 4 chronic kidney disease, or unspecified chronic kidney disease: Secondary | ICD-10-CM | POA: Diagnosis not present

## 2021-11-06 DIAGNOSIS — N189 Chronic kidney disease, unspecified: Secondary | ICD-10-CM | POA: Diagnosis not present

## 2021-11-06 DIAGNOSIS — E1122 Type 2 diabetes mellitus with diabetic chronic kidney disease: Secondary | ICD-10-CM | POA: Diagnosis not present

## 2021-11-06 DIAGNOSIS — N179 Acute kidney failure, unspecified: Secondary | ICD-10-CM | POA: Diagnosis not present

## 2021-11-06 DIAGNOSIS — R809 Proteinuria, unspecified: Secondary | ICD-10-CM | POA: Diagnosis not present

## 2021-11-06 DIAGNOSIS — N183 Chronic kidney disease, stage 3 unspecified: Secondary | ICD-10-CM | POA: Diagnosis not present

## 2021-11-23 DIAGNOSIS — I480 Paroxysmal atrial fibrillation: Secondary | ICD-10-CM | POA: Diagnosis not present

## 2021-11-23 DIAGNOSIS — I1310 Hypertensive heart and chronic kidney disease without heart failure, with stage 1 through stage 4 chronic kidney disease, or unspecified chronic kidney disease: Secondary | ICD-10-CM | POA: Diagnosis not present

## 2021-11-23 DIAGNOSIS — N1832 Chronic kidney disease, stage 3b: Secondary | ICD-10-CM | POA: Diagnosis not present

## 2021-11-23 DIAGNOSIS — E1129 Type 2 diabetes mellitus with other diabetic kidney complication: Secondary | ICD-10-CM | POA: Diagnosis not present

## 2021-12-10 ENCOUNTER — Encounter: Payer: Self-pay | Admitting: Podiatry

## 2021-12-10 ENCOUNTER — Ambulatory Visit (INDEPENDENT_AMBULATORY_CARE_PROVIDER_SITE_OTHER): Payer: PPO | Admitting: Podiatry

## 2021-12-10 DIAGNOSIS — E1142 Type 2 diabetes mellitus with diabetic polyneuropathy: Secondary | ICD-10-CM

## 2021-12-10 DIAGNOSIS — B351 Tinea unguium: Secondary | ICD-10-CM

## 2021-12-10 DIAGNOSIS — N183 Chronic kidney disease, stage 3 unspecified: Secondary | ICD-10-CM

## 2021-12-10 DIAGNOSIS — M79674 Pain in right toe(s): Secondary | ICD-10-CM

## 2021-12-10 DIAGNOSIS — I739 Peripheral vascular disease, unspecified: Secondary | ICD-10-CM | POA: Diagnosis not present

## 2021-12-10 NOTE — Progress Notes (Signed)
This patient returns to my office for at risk foot care.  This patient requires this care by a professional since this patient will be at risk due to having CKD, Coagulation defect Diabetes and PAD. Patient is taking xarelto. Asymptomatic porokeratosis  B/L.This patient is unable to cut nails himself since the patient cannot reach his nails.These nails are painful walking and wearing shoes.     This patient presents for at risk foot care today.  General Appearance  Alert, conversant and in no acute stress.  Vascular  Dorsalis pedis and posterior tibial  pulses are palpable  bilaterally.  Capillary return is within normal limits  bilaterally. Temperature is within normal limits  bilaterally.  Neurologic  Senn-Weinstein monofilament wire test within normal limits  bilaterally. Muscle power within normal limits bilaterally.  Nails Thick disfigured discolored nails with subungual debris  from hallux to fifth toes bilaterally. No evidence of bacterial infection or drainage bilaterally.  Orthopedic  No limitations of motion  feet .  No crepitus or effusions noted.  No bony pathology or digital deformities noted. Plantar flexed fifth metatarsal  B/L.  Skin  normotropic skin with no porokeratosis noted bilaterally.  No signs of infections or ulcers noted.   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  Onychomycosis  Pain in right toes  Pain in left toes    Consent was obtained for treatment procedures.   Mechanical debridement of nails 1-5  bilaterally performed with a nail nipper.  Filed with dremel without incident.   Return office visit   3 months                   Told patient to return for periodic foot care and evaluation due to potential at risk complications.   Gardiner Barefoot DPM

## 2021-12-24 DIAGNOSIS — E1129 Type 2 diabetes mellitus with other diabetic kidney complication: Secondary | ICD-10-CM | POA: Diagnosis not present

## 2021-12-24 DIAGNOSIS — I480 Paroxysmal atrial fibrillation: Secondary | ICD-10-CM | POA: Diagnosis not present

## 2021-12-24 DIAGNOSIS — I1310 Hypertensive heart and chronic kidney disease without heart failure, with stage 1 through stage 4 chronic kidney disease, or unspecified chronic kidney disease: Secondary | ICD-10-CM | POA: Diagnosis not present

## 2021-12-24 DIAGNOSIS — N1832 Chronic kidney disease, stage 3b: Secondary | ICD-10-CM | POA: Diagnosis not present

## 2022-01-09 ENCOUNTER — Telehealth: Payer: Self-pay | Admitting: Neurology

## 2022-01-09 NOTE — Telephone Encounter (Signed)
Pt called in stating the gabapentin 300 mg is not working. He said the larger dose he was on before was hurting his kidneys. He says it's hard for him to open his mouth to eat a meal. He is interested in maybe taking Tegretol XR- 200 mg. He took that back in 2019.

## 2022-01-10 ENCOUNTER — Other Ambulatory Visit: Payer: Self-pay

## 2022-01-10 DIAGNOSIS — G500 Trigeminal neuralgia: Secondary | ICD-10-CM

## 2022-01-10 MED ORDER — PREDNISONE 10 MG (21) PO TBPK: ORAL_TABLET | ORAL | 0 refills | Status: DC

## 2022-01-10 NOTE — Telephone Encounter (Signed)
Please let pt know that we can try a course of steroids to see if we can control the pain.  We cannot use carbamazepine due to drug-drug interaction with his xeralto and lamictal.  If he is agreeable to trying prednisone, we can send a one week taper prescription.

## 2022-01-10 NOTE — Telephone Encounter (Signed)
Pt called back in stating he has not heard back from anyone. He is in excruciating pain and would like a call back today on what he can do.

## 2022-01-10 NOTE — Telephone Encounter (Signed)
Called patient and he is willing to try the taper have sent it into the pharmacy

## 2022-01-31 ENCOUNTER — Telehealth: Payer: Self-pay | Admitting: Neurology

## 2022-01-31 NOTE — Telephone Encounter (Signed)
Patient called and left a message he'd like to get a refill on his prednisone.

## 2022-02-01 NOTE — Telephone Encounter (Signed)
He was given prednisone on 8/17 for acute pain. It is too early to try to use this again.  I recommend he set up a follow-up appointment next Wednesday to see how he is doing and discuss medication options.

## 2022-02-04 NOTE — Telephone Encounter (Signed)
Gabapentin '300mg'$  TID, he sch the VV for Wednesday 13th. He said he cant eat sometimes. Some times he cant feel his lip.

## 2022-02-04 NOTE — Telephone Encounter (Signed)
Called patient and wife stated he is not home. Left message with patients wife for a call back.   Need to ask patient how much gabapentin he is on? And offer him work in on Wednesday. Virtual is ok.

## 2022-02-04 NOTE — Telephone Encounter (Signed)
Called patient and wife stated he was asleep and would give him a message to give me a call back.

## 2022-02-04 NOTE — Telephone Encounter (Signed)
Patient is returning a call to [facility]. He said he is having issues with his mouth/jaw again. Wants to know if he can take prednisone.

## 2022-02-05 NOTE — Telephone Encounter (Signed)
Please have him take an extra gabapentin today - ok to take '300mg'$  four times daily or take an additional one with his afternoon dose.

## 2022-02-05 NOTE — Telephone Encounter (Signed)
Called and left a message with patient with wife to call me back.

## 2022-02-06 ENCOUNTER — Telehealth (INDEPENDENT_AMBULATORY_CARE_PROVIDER_SITE_OTHER): Payer: HMO | Admitting: Neurology

## 2022-02-06 ENCOUNTER — Encounter: Payer: Self-pay | Admitting: Neurology

## 2022-02-06 VITALS — Ht 70.0 in | Wt 192.0 lb

## 2022-02-06 DIAGNOSIS — G500 Trigeminal neuralgia: Secondary | ICD-10-CM

## 2022-02-06 MED ORDER — OXCARBAZEPINE 150 MG PO TABS: 75.0000 mg | ORAL_TABLET | Freq: Two times a day (BID) | ORAL | 3 refills | Status: DC

## 2022-02-06 NOTE — Progress Notes (Signed)
   Due to the COVID-19 crisis, this telephone visit was done via telephone from my office and it was initiated and consent given by this patient and or family.  Telephone (Audio) Visit The purpose of this telephone visit is to provide medical care while limiting exposure to the novel coronavirus.    Consent was obtained for telephone visit and initiated by pt/family:  Yes.   Answered questions that patient had about telehealth interaction:  Yes.   I discussed the limitations, risks, security and privacy concerns of performing an evaluation and management service by telephone. I also discussed with the patient that there may be a patient responsible charge related to this service. The patient expressed understanding and agreed to proceed.  Pt location: Home Physician Location: office Name of referring provider:  Tisovec, Fransico Him, MD I connected with .Erik [name] at patients initiation/request on 02/06/2022 at  9:50 AM EDT by telephone and verified that I am speaking with the correct person using two identifiers.  Pt MRN:  [id number] Pt DOB:  1940/08/10  Assessment/Plan:  Right trigeminal neuralgia with refractory pain  - previously tried: carbamazepine, prednisone (effective)  - Start oxcarbamazepine '75mg'$  BID, if tolerating, will titrate to '150mg'$  BID.  Starting low dose due to renal function  - Continue gabapentin '300mg'$  TID - renally dosed, GFR ~20  Patient to call with update next week to determine medication adjustment  Subjective:  For the past 5-6 months, he has noticed worsening right facial pain.  It is worse with chewing, so takes him along time to eat meals. Pain occurs daily throughout the day.  He currently takes gabapentin '300mg'$  TID, renally-dosed.   Three weeks ago, he called with worsening pain and was given a prednisone taper.  He has significant relief with this and was pain-free for about 2 weeks.      Objective:   Vitals:   02/06/22 0920  Weight: 192 lb (87.1 kg)   Height: '5\' 10"'$  (1.778 m)     Follow Up Instructions:     I discussed the assessment and treatment plan with the patient. The patient was provided an opportunity to ask questions and all were answered. The patient agreed with the plan and demonstrated an understanding of the instructions.   The patient was advised to call back or seek an in-person evaluation if the symptoms worsen or if the condition fails to improve as anticipated.    Total Time spent in visit with the patient was:  20 min, of which 100% of the time was spent in counseling and/or coordinating care.   Pt understands and agrees with the plan of care outlined.     Alda Berthold, DO

## 2022-02-06 NOTE — Telephone Encounter (Signed)
Patient has a virtual appt with Dr. Posey Pronto today. Dr. Posey Pronto will discuss Gabapentin on virtual call.

## 2022-02-07 DIAGNOSIS — N189 Chronic kidney disease, unspecified: Secondary | ICD-10-CM | POA: Diagnosis not present

## 2022-02-07 DIAGNOSIS — E1122 Type 2 diabetes mellitus with diabetic chronic kidney disease: Secondary | ICD-10-CM | POA: Diagnosis not present

## 2022-02-07 DIAGNOSIS — N183 Chronic kidney disease, stage 3 unspecified: Secondary | ICD-10-CM | POA: Diagnosis not present

## 2022-02-07 DIAGNOSIS — N179 Acute kidney failure, unspecified: Secondary | ICD-10-CM | POA: Diagnosis not present

## 2022-02-07 DIAGNOSIS — R809 Proteinuria, unspecified: Secondary | ICD-10-CM | POA: Diagnosis not present

## 2022-02-07 DIAGNOSIS — I129 Hypertensive chronic kidney disease with stage 1 through stage 4 chronic kidney disease, or unspecified chronic kidney disease: Secondary | ICD-10-CM | POA: Diagnosis not present

## 2022-02-18 ENCOUNTER — Telehealth: Payer: Self-pay | Admitting: Neurology

## 2022-02-18 NOTE — Telephone Encounter (Signed)
Patient stated that he was started on Oxcarbamazepine 75 mg BID and it has not been working for him at all. I asked patient if he was tolerating it ok at 75 mg BID and he states yes. I informed patient that I will send a message to Dr. Posey Pronto to let her know about medication not working and if she had other recommendations. Patient is aware that I will give him a call back.

## 2022-02-18 NOTE — Telephone Encounter (Signed)
Pt said he needs a call back ASAP about the medicine patel has him on. It is not helping him at all

## 2022-02-19 MED ORDER — OXCARBAZEPINE 150 MG PO TABS
150.0000 mg | ORAL_TABLET | Freq: Two times a day (BID) | ORAL | 3 refills | Status: DC
Start: 2022-02-19 — End: 2023-04-02

## 2022-02-19 NOTE — Telephone Encounter (Signed)
Called patient and informed him that Per Dr. Posey Pronto since he is on a very low dose, let's have him increase the dose to 1 tablet twice daily.  We can still increase it further, but want to be sure he is tolerating it well.  I can send a new Rx since the dose will be increased.  Patient verbalized understanding and had no questions or concerns.

## 2022-02-19 NOTE — Telephone Encounter (Signed)
It's a very low dose, let's have him increase the dose to 1 tablet twice daily.  We can still increase it further, but want to be sure he is tolerating it well.  I can send a new Rx since the dose will be increased.

## 2022-02-21 ENCOUNTER — Telehealth: Payer: Self-pay | Admitting: Neurology

## 2022-02-21 NOTE — Telephone Encounter (Signed)
Pt wants a call back to discuss medicine. He said he needs a call to discuss if he is able to tolerate it. oxcarbamzaepine

## 2022-02-22 NOTE — Telephone Encounter (Signed)
Called and spoke to patient and he stated that his insurance will not pay for the increased dose of Oxcarbazepine 150 mg twice a day until November 31 st. Patient stated that he will just double up his 75 mg tablets to equal 150 mg twice a day until he can get his new rx filled.   Patient had no further questions or concerns.

## 2022-03-13 ENCOUNTER — Ambulatory Visit: Payer: HMO | Admitting: Podiatry

## 2022-03-25 ENCOUNTER — Ambulatory Visit: Payer: HMO | Admitting: Podiatry

## 2022-03-25 ENCOUNTER — Telehealth: Payer: Self-pay | Admitting: Cardiology

## 2022-03-25 DIAGNOSIS — M79674 Pain in right toe(s): Secondary | ICD-10-CM

## 2022-03-25 DIAGNOSIS — E1142 Type 2 diabetes mellitus with diabetic polyneuropathy: Secondary | ICD-10-CM | POA: Diagnosis not present

## 2022-03-25 DIAGNOSIS — M216X1 Other acquired deformities of right foot: Secondary | ICD-10-CM

## 2022-03-25 DIAGNOSIS — B351 Tinea unguium: Secondary | ICD-10-CM

## 2022-03-25 DIAGNOSIS — N183 Chronic kidney disease, stage 3 unspecified: Secondary | ICD-10-CM

## 2022-03-25 DIAGNOSIS — D6859 Other primary thrombophilia: Secondary | ICD-10-CM

## 2022-03-25 NOTE — Telephone Encounter (Signed)
Patient calling in because he is having drainage on his legs. Calling to see what he needs to do. Please advise

## 2022-03-25 NOTE — Telephone Encounter (Signed)
Pt is calling to report to Dr. Johney Frame that both lower extremities are red, streaky, hot, leaking pus, and have sores/ulcers popping up.  Pt states they're hot to the touch. He reports he is a diabetic, and has had a history of ulcers and cellulitis in the past.   He denies being afebrile at this time.    He denies any cardiac complaints.   He did state both extremities are swollen.   He is inquiring what to do about this.   Advised the pt that he needs to get in with the MD who cares and treats his diabetes.  He is aware that this sounds like he may have cellulitis or ulcers related to uncontrolled diabetes.  Pt states his PCP manages this.   Advised him to call his PCP now and get an appt with them today.  Advised him he may need antibiotics and someone to wrap and treat his legs/ulcers.  Advised him if he couldn't get in with PCP today, he could always go to [facility] or [facility] for further treatment.   Pt aware I will make Dr. Johney Frame aware of this plan.  Pt verbalized understanding and agrees with this plan.  He states he will call his PCP now.

## 2022-03-25 NOTE — Progress Notes (Signed)
This patient returns to my office for at risk foot care.  This patient requires this care by a professional since this patient will be at risk due to having CKD, Coagulation defect Diabetes and PAD. Patient is taking xarelto. Asymptomatic porokeratosis  B/L.This patient is unable to cut nails himself since the patient cannot reach his nails.These nails are painful walking and wearing shoes.     This patient presents for at risk foot care today.  General Appearance  Alert, conversant and in no acute stress.  Vascular  Dorsalis pedis and posterior tibial  pulses are palpable  bilaterally.  Capillary return is within normal limits  bilaterally. Temperature is within normal limits  bilaterally.  Neurologic  Senn-Weinstein monofilament wire test within normal limits  bilaterally. Muscle power within normal limits bilaterally.  Nails Thick disfigured discolored nails with subungual debris  from hallux to fifth toes bilaterally. No evidence of bacterial infection or drainage bilaterally.  Orthopedic  No limitations of motion  feet .  No crepitus or effusions noted.  No bony pathology or digital deformities noted. Plantar flexed fifth metatarsal  B/L. Mallet toe second right foot.  Skin  normotropic skin with no porokeratosis noted bilaterally.  No signs of infections or ulcers noted.   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 Asymptomatic blood blister second toe right foot.  Onychomycosis  Pain in right toes  Pain in left toes    Consent was obtained for treatment procedures.   Mechanical debridement of nails 1-5  bilaterally performed with a nail nipper.  Filed with dremel without incident.   Return office visit   3 months                   Told patient to return for periodic foot care and evaluation due to potential at risk complications.   Gardiner Barefoot DPM

## 2022-04-01 ENCOUNTER — Other Ambulatory Visit: Payer: Self-pay | Admitting: *Deleted

## 2022-04-01 ENCOUNTER — Ambulatory Visit: Payer: HMO | Admitting: Cardiology

## 2022-04-01 MED ORDER — METOPROLOL TARTRATE 50 MG PO TABS
75.0000 mg | ORAL_TABLET | Freq: Two times a day (BID) | ORAL | 2 refills | Status: DC
Start: 2022-04-01 — End: 2022-04-10

## 2022-04-07 NOTE — Progress Notes (Unsigned)
Cardiology Office Note:    Date:  04/07/2022   ID:  Erik, [name] 1940-09-15, MRN [id number]  PCP:  Erik Pao, MD   [facility] Providers Cardiologist:  Erik Bergeron, MD {  Referring MD: Erik Pao, MD    History of Present Illness:    Erik [name] is a 81 y.o. male with a hx of nonobstructive coronary artery disease , PVCs, paroxysmal atrial fibrillation, nonischemic cardiomyopathy (EF 35-40% 2009, 55% in 2010, 35-45% 2015 during admission for atrial fibrillation with RVR and 50-55% in 2016), CKD III, DMII, HTN, and HLD who was previously followed by Dr. Meda [name] who now returns to clinic for follow-up.  Per review of the record, the patient has a history of NICM with lowest EF 35-40% in 2009 and most recent EF 50-55% in 2016. He also has a history of Afib on xarelto for [facility].   Was last seen in clinic on 01/23/21 where he felt well and was active. He had lost weight.  Today, he is doing well from a CV standpoint. Currently, he has been taking 400 mg gabapentin for 2 months for R-sided jaw pain recurring after 3 years. Since starting gabapentin, he endorses swelling in his bilateral ankles. When he notices swelling, he uses Lasix once a day. He is tolerating Xarelto with no bleeding issues.   He is able to walk with no exertional symptoms. However, he has 2 "knots" on his 5th R toe that limits his activity. Reportedly, his blood pressure at home is running 120/60s. He denies chest pain, chest pressure, dyspnea at rest or with exertion, palpitations, PND, or orthopnea.   Past Medical History:  Diagnosis Date   Chronic combined systolic and diastolic CHF (congestive heart failure) (HCC)    CKD (chronic kidney disease), stage III (HCC)    Coronary artery disease    a. Nonobstructive by cath 2004. b. Nuc 09/2013 - no ischemia, fixed defect suggestive of possible prior infarction, felt low risk, EF 48%.    Diabetes mellitus    GERD (gastroesophageal reflux  disease)    Hyperlipidemia    Hypertension    NICM (nonischemic cardiomyopathy) ([place])    a. EF 35-40% in 2009, 55% in 2010. b. 09/2013: 35-40% by echo and 48% by nuc. c. EF 50-55% by echo 12/2014.   PAD (peripheral artery disease) (HCC)    a. mild by noninvasive testing.   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     Allergies:   Claritin [loratadine], Tradjenta [linagliptin], and Imipramine   Social History   Socioeconomic History   Marital status: Married    Spouse name: Not on file   Number of children: Not on file   Years of education: Not on file   Highest education level: Not on file  Occupational History   Occupation: retired    Comment: from maintenance  Tobacco Use  Smoking status: Former    Packs/day: 0.50    Years: 10.00    Total pack years: 5.00    Types: Cigarettes    Quit date: 01/21/1985    Years since quitting: 37.2   Smokeless tobacco: Never   Tobacco comments:    quit 35 yrs ago  Vaping Use   Vaping Use: Never used  Substance and Sexual Activity   Alcohol use: No   Drug use: No   Sexual activity: Not Currently    Birth control/protection: None  Other Topics Concern   Not on file  Social History Narrative   Lives with wife in a one story home.  Has 3 children.  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     Education: high school.       Right Handed    Lives in a one story    Social Determinants of Health   Financial Resource Strain: Not on file  Food Insecurity: No Food Insecurity (02/25/2020)   Hunger Vital Sign    Worried About Running Out of Food in the Last Year: Never true    Ran Out of Food in the Last Year: Never true  Transportation Needs: Not on file  Physical Activity: Not on file  Stress: Not on file  Social Connections: Not on file     Family History: The patient's family history includes Diabetes in his brother and mother; Heart disease in his brother, father, and mother; Hypertension in his brother and mother. There is no history of Heart attack or Stroke.  ROS:   Please see the history of present illness.    Review of Systems  Constitutional:  Negative for malaise/fatigue and weight loss.  HENT:  Negative for congestion and sore throat.   Eyes:  Negative for blurred vision.  Respiratory:  Negative for cough and shortness of breath.   Cardiovascular:  Positive for leg swelling. Negative for chest pain, palpitations, orthopnea, claudication and PND.  Gastrointestinal:  Negative for heartburn and nausea.  Genitourinary:  Negative for frequency and urgency.  Musculoskeletal:  Positive for joint pain (R jaw and 5th R toe). Negative for back pain and myalgias.  Skin:  Negative for rash.  Neurological:  Negative for dizziness and headaches.  Endo/Heme/Allergies:  Does not bruise/bleed easily.  Psychiatric/Behavioral:  The patient is not nervous/anxious and does not have insomnia.    All other systems reviewed and are negative.  EKGs/Labs/Other Studies Reviewed:    The following studies were reviewed today: Lower Extremity Arterial Duplex 09/09/19 Summary:  Right: Atherosclerosis in the iliac, common femoral, femoral, popliteal  and tibial arteries.  <50% stenosis in the common iliac artery.  <50% stenosis in the external iliac artery.  30-49% stenosis in the mid superficial femoral artery.   Three vessel runoff.   Left: Atherosclerosis in the iliac, common femoral, femoral, popliteal and  tibial arteries.  <50% stenosis in the common iliac artery.  30-49% stenosis in the superficial femoral artery.  50-74% stenosis in the proximal/mid anterior tibial artery.  Three vessel runoff.   TTE 2016: Study Conclusions   - Left ventricle: The cavity size was normal. Systolic function was    normal. The estimated ejection fraction was in the range of 50%    to 55%. There is hypokinesis of the basal-midinferior myocardium.    Doppler parameters are consistent with abnormal left ventricular    relaxation (grade 1 diastolic dysfunction).  - Ventricular septum: Septal motion showed paradox.  - Mitral valve: Mildly calcified  annulus. There was mild    regurgitation.  - Left atrium: The atrium was mildly dilated.   Impressions:   - Compared to the prior study, there has been no significant    interval change.   EKG:  EKG was not ordered today  Recent Labs: 09/03/2021: BUN 35; Creatinine, Ser 2.23; Hemoglobin 11.7; Platelets 102; Potassium 3.9; Sodium 141  Recent Lipid Panel    Component Value Date/Time   CHOL 115 09/03/2021 0839   TRIG 95 09/03/2021 0839   HDL 42 09/03/2021 0839   CHOLHDL 2.7 09/03/2021 0839   LDLCALC 55 09/03/2021 0839     Risk Assessment/Calculations:           Physical Exam:    VS:  There were no vitals taken for this visit.    Wt Readings from Last 3 Encounters:  02/06/22 192 lb (87.1 kg)  09/03/21 198 lb 6.4 oz (90 kg)  06/20/21 199 lb (90.3 kg)     GEN: Well nourished, well developed in no acute distress HEENT: Normal NECK: No JVD; No carotid bruits LYMPHATICS: No lymphadenopathy CARDIAC: RRR, no murmurs, rubs, gallops RESPIRATORY:  Clear to auscultation without rales, wheezing or rhonchi  ABDOMEN: Soft, non-tender, non-distended MUSCULOSKELETAL:  1+ pitting edema bilaterally; No deformity  SKIN: Warm and dry NEUROLOGIC:  Alert and  oriented x 3 PSYCHIATRIC:  Normal affect   ASSESSMENT:    No diagnosis found.  PLAN:    In order of problems listed above:  #Chronic Combined Systolic and Diastolic HF: #NICM: Cath in 2004 without significant disease. TTE 2009 with EF 35-40% which has improved to 50-55% on TTE in 2016. Currently doing well with NYHA class II symptoms. Currently with mild, chronic LE edema on exam but no othopnea, PND or dyspnea on exertion. -Continue lasix '40mg'$  daily as needed -Continue metop '75mg'$  BID -Not on ACE/ARB/spiro for now given reports renal function is worsening; can consider adding in the future -Check BMET today -Low Na diet  #Paroxysmal Afib: CHADs-vasc 6. Doing well and in NSR today. Tolerating xarelto without issuses. -Continue xarelto '20mg'$  for AC -Continue metop '50mg'$  BID -Continue dilt '120mg'$  daily  #Nonobstructive CAD: No anginal symptoms. -Continue lipitor '40mg'$  daily -Metop as above  #HTN: Controlled. -Continue metop '50mg'$  BID -Continue dilt '120mg'$  daily  #HLD: -Continue lipitor '40mg'$  daily  #Mild MR: Noted on TTE in 2016. -Check TTE  #CKD III: -Follow-up with nephology as scheduled -Reportedly Cr has been rising; will check BMET today        Medication Adjustments/Labs and Tests Ordered: Current medicines are reviewed at length with the patient today.  Concerns regarding medicines are outlined above.  No orders of the defined types were placed in this encounter.  No orders of the defined types were placed in this encounter.   There are no Patient Instructions on file for this visit.    I,Mykaella Javier,acting as a scribe for Erik Bergeron, MD.,have documented all relevant documentation on the behalf of Erik Bergeron, MD,as directed by  Erik Bergeron, MD while in the presence of Erik Bergeron, MD.  I, Erik Bergeron, MD, have reviewed all documentation for this visit. The documentation on 04/07/22 for the exam, diagnosis,  procedures, and orders are all accurate and complete.   Signed, Erik Bergeron, MD  04/07/2022 8:57 PM    [place]

## 2022-04-10 ENCOUNTER — Ambulatory Visit: Payer: HMO | Admitting: Cardiology

## 2022-04-10 ENCOUNTER — Ambulatory Visit: Payer: HMO | Attending: Cardiology | Admitting: Cardiology

## 2022-04-10 VITALS — BP 134/58 | HR 69 | Ht 70.0 in | Wt 184.2 lb

## 2022-04-10 DIAGNOSIS — I10 Essential (primary) hypertension: Secondary | ICD-10-CM

## 2022-04-10 DIAGNOSIS — I5043 Acute on chronic combined systolic (congestive) and diastolic (congestive) heart failure: Secondary | ICD-10-CM

## 2022-04-10 DIAGNOSIS — I480 Paroxysmal atrial fibrillation: Secondary | ICD-10-CM

## 2022-04-10 DIAGNOSIS — I5022 Chronic systolic (congestive) heart failure: Secondary | ICD-10-CM | POA: Diagnosis not present

## 2022-04-10 DIAGNOSIS — D6869 Other thrombophilia: Secondary | ICD-10-CM

## 2022-04-10 DIAGNOSIS — N183 Chronic kidney disease, stage 3 unspecified: Secondary | ICD-10-CM

## 2022-04-10 DIAGNOSIS — I2510 Atherosclerotic heart disease of native coronary artery without angina pectoris: Secondary | ICD-10-CM

## 2022-04-10 DIAGNOSIS — E782 Mixed hyperlipidemia: Secondary | ICD-10-CM

## 2022-04-10 DIAGNOSIS — Z79899 Other long term (current) drug therapy: Secondary | ICD-10-CM | POA: Diagnosis not present

## 2022-04-10 DIAGNOSIS — I2583 Coronary atherosclerosis due to lipid rich plaque: Secondary | ICD-10-CM

## 2022-04-10 DIAGNOSIS — I428 Other cardiomyopathies: Secondary | ICD-10-CM

## 2022-04-10 MED ORDER — DAPAGLIFLOZIN PROPANEDIOL 10 MG PO TABS: 10.0000 mg | ORAL_TABLET | Freq: Every day | ORAL | 6 refills | Status: DC

## 2022-04-10 MED ORDER — METOPROLOL SUCCINATE ER 50 MG PO TB24: 75.0000 mg | ORAL_TABLET | Freq: Every day | ORAL | 1 refills | Status: DC

## 2022-04-10 NOTE — Progress Notes (Signed)
Cardiology Office Note:    Date:  04/10/2022   ID:  Erik [name], Erik [name] 03-02-1941, MRN [id number]  PCP:  Erik Pao, MD   [name] Providers Cardiologist:  Erik Bergeron, MD {  Referring MD: Erik Pao, MD    History of Present Illness:    Erik [name] is a 81 y.o. male with a hx of nonobstructive coronary artery disease , PVCs, paroxysmal atrial fibrillation, nonischemic cardiomyopathy (EF 35-40% 2009, 55% in 2010, 35-45% 2015 during admission for atrial fibrillation with RVR and 50-55% in 2016), CKD III, DMII, HTN, and HLD who was previously followed by Dr. Meda [name] who now returns to clinic for follow-up.  Per review of the record, the patient has a history of NICM with lowest EF 35-40% in 2009 and most recent EF 50-55% in 2016. He also has a history of Afib on xarelto for [facility]   Was last seen in clinic on 08/2021 where he was doing well from a CV standpoint. Was having neuropathic pain. TTE 08/2021 for monitoring MR showed EF 45-50% with global hypokinesis, normal RV function with mildly enlarged size, mild PHTN, mild MR.   Today, he states that he has been doing well overall. He continues to have BLE swelling R>L x2-3 months. He reports that his nephrologist started him on Lasix 37m daily <2 weeks ago and his symptoms have been slowly improving; He sees his nephrologist again in January for labs. He often wears compression socks with relief as well. He denies any orthopnea (sleeping on 1 pillow), PND, worsening DOE or chest pain.  Today, we discussed his echo results from 08/2021 which showed decreased LVEF. We discussed that we are going to adjust his medications further to help treat his HFrEF.  The patient denies palpitations, chest pain, chest pressure, or dyspnea at rest or with exertion. Denies cough, fever, chills, nausea, or vomiting. Denies syncope or presyncope.  Past Medical History:  Diagnosis Date   Chronic combined systolic and diastolic  CHF (congestive heart failure) (HCC)    CKD (chronic kidney disease), stage III (HCC)    Coronary artery disease    a. Nonobstructive by cath 2004. b. Nuc 09/2013 - no ischemia, fixed defect suggestive of possible prior infarction, felt low risk, EF 48%.    Diabetes mellitus    GERD (gastroesophageal reflux disease)    Hyperlipidemia    Hypertension    [place]mic [place]) [place]    a. EF 35-40% in 2009, 55% in 2010. b. 09/2013: 35-40% by echo and 48% by nuc. c. EF 50-55% by echo 12/2014.   PAD (peripheral artery disease) (HCC)    a. mild by noninvasive testing.   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    Premature ventricular contractions    Thrombocytopenia ([place]    Trigeminal neuralgia     Past Surgical History:  Procedure Laterality Date   CARDIAC CATHETERIZATION  10/25/00, 12/29/02   CHOLECYSTECTOMY N/A 01/25/2015   Procedure: LAPAROSCOPIC CHOLECYSTECTOMY ;  Surgeon: DCoralie Keens MD;  [facility]  Service: General;  Laterality: N/A;   SHOULDER SURGERY Left 2011   Shoulder surgery, rod inserted    Current Medications: Current Meds  Medication Sig   acetaminophen (TYLENOL) 325 MG tablet Take 2 tablets (650 mg total) by mouth every 6 (six) hours as needed for mild pain (or Fever >/= 101).   arginine 500 MG tablet Take 1,000 mg by mouth daily.  atorvastatin (LIPITOR) 40 MG tablet Take 1 tablet by mouth daily.   BD PEN NEEDLE NANO U/F 32G X 4 MM MISC USE 1 NEEDLE WITH INSULIN INJECTION ONCE DAILY   Blood Glucose Calibration (TRUE METRIX LEVEL 1) Low SOLN USE TO CALIBRATE GLUCOMETER ONCE MONTHY OR WITH EACH NEW VIAL OF STRIPS, WHICHEVER COMES FIRST   Blood Glucose Monitoring Suppl (TRUE METRIX AIR GLUCOSE METER) w/Device KIT use to check blood sugar three times daily prior to meals. E11.29   Coenzyme Q10 (COQ10) 200 MG CAPS Take 100 mg by mouth daily.    Continuous Blood Gluc Receiver (FREESTYLE LIBRE 2 READER) DEVI  See admin instructions.   Continuous Blood Gluc Sensor (FREESTYLE LIBRE 2 SENSOR) MISC Inject into the skin every 14 (fourteen) days.   dapagliflozin propanediol (FARXIGA) 10 MG TABS tablet Take 1 tablet (10 mg total) by mouth daily before breakfast.   finasteride (PROSCAR) 5 MG tablet Take 5 mg by mouth once daily   furosemide (LASIX) 40 MG tablet Take 1 tablet (40 mg total) by mouth as needed for fluid or edema.   gabapentin (NEURONTIN) 300 MG capsule Take 1 capsule (300 mg total) by mouth 3 (three) times daily.   glucose blood (TRUE METRIX BLOOD GLUCOSE TEST) test strip use to check blood sugar three times daily prior to meals. E11.29   ipratropium (ATROVENT) 0.03 % nasal spray    metoprolol succinate (TOPROL XL) 50 MG 24 hr tablet Take 1.5 tablets (75 mg total) by mouth daily. Take with or immediately following a meal.   moxifloxacin (VIGAMOX) 0.5 % ophthalmic solution Place 1 drop into the left eye 4 (four) times daily.   Multiple Vitamin (MULTIVITAMIN) capsule Take 1 capsule by mouth daily.   omeprazole (PRILOSEC) 20 MG capsule Take 20 mg by mouth daily.   OXcarbazepine (TRILEPTAL) 150 MG tablet Take 1 tablet (150 mg total) by mouth 2 (two) times daily.   pantoprazole (PROTONIX) 20 MG tablet Take 20 mg by mouth daily.   PROLENSA 0.07 % SOLN    Rivaroxaban (XARELTO) 15 MG TABS tablet TAKE 1 TABLET(15 MG) BY MOUTH DAILY WITH SUPPER   selenium 200 MCG TABS tablet Take 200 mcg by mouth daily.   tamsulosin (FLOMAX) 0.4 MG CAPS capsule Take 0.4 mg by mouth at bedtime.   TOUJEO SOLOSTAR 300 UNIT/ML SOPN Inject 36 Units as directed daily.    Trospium Chloride 60 MG CP24    TRUEplus Lancets 33G MISC use to check blood sugar three times daily prior to meals. E11.29   [DISCONTINUED] metoprolol tartrate (LOPRESSOR) 50 MG tablet Take 1.5 tablets (75 mg total) by mouth 2 (two) times daily.   [DISCONTINUED] pantoprazole (PROTONIX) 40 MG tablet Take 1 tablet by mouth daily.     Allergies:   Claritin  [loratadine], Tradjenta [linagliptin], and Imipramine   Social History   Socioeconomic History   Marital status: Married    Spouse name: Not on file   Number of children: Not on file   Years of education: Not on file   Highest education level: Not on file  Occupational History   Occupation: retired    Comment: from maintenance  Tobacco Use   Smoking status: Former    Packs/day: 0.50    Years: 10.00    Total pack years: 5.00    Types: Cigarettes    Quit date: 01/21/1985    Years since quitting: 37.2   Smokeless tobacco: Never   Tobacco comments:    quit 35 yrs ago  Vaping Use   Vaping Use: Never used  Substance and Sexual Activity   Alcohol use: No   Drug use: No   Sexual activity: Not Currently    Birth control/protection: None  Other Topics Concern   Not on file  Social History Narrative   Lives with wife in a one story home.  Has 3 children.  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     Education: high school.      Right Handed    Lives in a one story    Social Determinants of Health   Financial Resource Strain: Not on file  Food Insecurity: No Food Insecurity (02/25/2020)   Hunger Vital Sign    Worried About Running Out of Food in the Last Year: Never true    Ran Out of Food in the Last Year: Never true  Transportation Needs: Not on file  Physical Activity: Not on file  Stress: Not on file  Social Connections: Not on file     Family History: The patient's family history includes Diabetes in his brother and mother; Heart disease in his brother, father, and mother; Hypertension in his brother and mother. There is no history of Heart attack or Stroke.  ROS:   Please see the history of present illness.    Review of Systems  Constitutional:  Negative for chills and fever.  HENT:  Negative for nosebleeds and tinnitus.   Eyes:  Negative for blurred vision and pain.  Respiratory:  Negative for cough, hemoptysis, shortness of breath and stridor.    Cardiovascular:  Positive for leg swelling. Negative for chest pain, palpitations, orthopnea, claudication and PND.       + Lightheadedness/orthostasis  Gastrointestinal:  Negative for blood in stool, diarrhea, nausea and vomiting.  Genitourinary:  Negative for dysuria and hematuria.  Musculoskeletal:  Negative for falls.  Neurological:  Negative for dizziness, loss of consciousness and headaches.  Psychiatric/Behavioral:  Negative for depression, hallucinations and substance abuse. The patient does not have insomnia.     EKGs/Labs/Other Studies Reviewed:    The following studies were reviewed today:  TTE 08/2021: IMPRESSIONS   1. Left ventricular ejection fraction, by estimation, is 45 to 50%. The  left ventricle has mildly decreased function. The left ventricle  demonstrates global hypokinesis. Left ventricular diastolic parameters are  consistent with Grade I diastolic  dysfunction (impaired relaxation). The average left ventricular global  longitudinal strain is -17.9 %.   2. Right ventricular systolic function is normal. The right ventricular  size is mildly enlarged. There is mildly elevated pulmonary artery  systolic pressure.   3. Left atrial size was mildly dilated.   4. The mitral valve is grossly normal. Mild mitral valve regurgitation.   5. The aortic valve is tricuspid. There is mild calcification of the  aortic valve. There is mild thickening of the aortic valve. Aortic valve  regurgitation is trivial. Aortic valve sclerosis/calcification is present,  without any evidence of aortic  stenosis.   Comparison(s): TTE 01/23/15 EF 50-55%, mild MR.    Lower Extremity Arterial Duplex 09/09/19 Summary:  Right: Atherosclerosis in the iliac, common femoral, femoral, popliteal  and tibial arteries.  <50% stenosis in the common iliac artery.  <50% stenosis in the external iliac artery.  30-49% stenosis in the mid superficial femoral artery.  Three vessel runoff.   Left:  Atherosclerosis in the iliac, common femoral, femoral, popliteal and  tibial arteries.  <50% stenosis in the common iliac artery.  30-49% stenosis in the  superficial femoral artery.  50-74% stenosis in the proximal/mid anterior tibial artery.  Three vessel runoff.   TTE 2016: Study Conclusions   - Left ventricle: The cavity size was normal. Systolic function was    normal. The estimated ejection fraction was in the range of 50%    to 55%. There is hypokinesis of the basal-midinferior myocardium.    Doppler parameters are consistent with abnormal left ventricular    relaxation (grade 1 diastolic dysfunction).  - Ventricular septum: Septal motion showed paradox.  - Mitral valve: Mildly calcified annulus. There was mild    regurgitation.  - Left atrium: The atrium was mildly dilated.   Impressions:   - Compared to the prior study, there has been no significant    interval change.   EKG:  EKG ordered today, 04/10/22.  The ekg ordered today demonstrates NSR with a PVC, nonspecific ST T wave changes.  Recent Labs: 09/03/2021: BUN 35; Creatinine, Ser 2.23; Hemoglobin 11.7; Platelets 102; Potassium 3.9; Sodium 141  Recent Lipid Panel    Component Value Date/Time   CHOL 115 09/03/2021 0839   TRIG 95 09/03/2021 0839   HDL 42 09/03/2021 0839   CHOLHDL 2.7 09/03/2021 0839   LDLCALC 55 09/03/2021 0839     Risk Assessment/Calculations:           Physical Exam:    VS:  BP (!) 134/58   Pulse 69   Ht _0  (1.778 m)   Wt 184 lb 3.2 oz (83.6 kg)   SpO2 97%   BMI 26.43 kg/m     Wt Readings from Last 3 Encounters:  04/10/22 184 lb 3.2 oz (83.6 kg)  02/06/22 192 lb (87.1 kg)  09/03/21 198 lb 6.4 oz (90 kg)     GEN: Well nourished, well developed in no acute distress HEENT: Normal NECK: No JVD; No carotid bruits CARDIAC: RRR, no murmurs, rubs, gallops RESPIRATORY:  Clear to auscultation without rales, wheezing or rhonchi  ABDOMEN: Soft, non-tender,  non-distended MUSCULOSKELETAL:  1-2+ pitting edema bilaterally ; No deformity  SKIN: Warm and dry NEUROLOGIC:  Alert and oriented x 3 PSYCHIATRIC:  Normal affect   ASSESSMENT:    1. Acute on chronic combined systolic (congestive) and diastolic (congestive) heart failure ([place])   2. Medication management   3. Chronic systolic congestive heart failure ([place])   4. Coronary artery disease due to lipid rich plaque   5. Stage 3 chronic kidney disease, unspecified whether stage 3a or 3b CKD ([place])   6. NICM (nonischemic cardiomyopathy) (HCC)   7. Paroxysmal atrial fibrillation ([place])   8. Essential hypertension   9. Mixed hyperlipidemia   10. Secondary hypercoagulable state ([place])     PLAN:    In order of problems listed above:  #Acute on Chronic Combined Systolic and Diastolic HF: #NICM: Cath in 2004 without significant disease. TTE 2009 with EF 35-40% which  improved to 50-55% on TTE in 2016 now 45-50% on TTE 08/2021. Currently with 1-2+ LE edema and has been started on lasix 37m daily per Nephrology with reported improvement. Will continue scheduled lasix, check BNP and adjust meds as below.  -Check BNP and BMET -Continue lasix 454mdaily for now; may need to increase dosing -Start farxiga 1057maily -Change metop to 43m48m daily -Will plan to start entresto; will clear with renal to ensure he did not have issues with ARB/ACE I in the past -Repeat BMET following start on entresto if starts -Low Na diet  #Paroxysmal Afib: CHADs-vasc 6.  Doing well. Tolerating xarelto without issuses. -Continue xarelto 66m for AC -Change metop to 745mXL daily -Continue dilt 120103maily  #Nonobstructive CAD: No anginal symptoms. -Continue lipitor 96m87mily -Metop as above  #HTN: Elevated up to 140s at home.  -Change metop to 75mg29mdaily -Continue dilt 120mg 93my -Will plan to add entresto as above  #HLD: -Continue lipitor 96mg d19m -LDL controlled at 55  #Mild MR: Stable on TTE  08/2021. Will continue with serial monitoring  #CKD III: -Follow-up with nephology as scheduled      Follow up: 2 weeks with APP to ensure LE edema improving  Medication Adjustments/Labs and Tests Ordered: Current medicines are reviewed at length with the patient today.  Concerns regarding medicines are outlined above.  Orders Placed This Encounter  Procedures   Basic metabolic panel   Pro b natriuretic peptide   Meds ordered this encounter  Medications   dapagliflozin propanediol (FARXIGA) 10 MG TABS tablet    Sig: Take 1 tablet (10 mg total) by mouth daily before breakfast.    Dispense:  30 tablet    Refill:  6   metoprolol succinate (TOPROL XL) 50 MG 24 hr tablet    Sig: Take 1.5 tablets (75 mg total) by mouth daily. Take with or immediately following a meal.    Dispense:  135 tablet    Refill:  1    Discontinued tartrate and started pt on this.    Patient Instructions  Medication Instructions:   STOP TAKING METOPROLOL TARTRATE (LOPRESSOR) NOW  START TAKING METOPROLOL SUCCINATE (TOPROL XL) 75 MG BY MOUTH DAILY  START TAKING FARXIGA 10 MG BY MOUTH DAILY BEFORE BREAKFAST  *If you need a refill on your cardiac medications before your next appointment, please call your pharmacy*   Lab Work:  TODAY--BMET AND PRO-BNP  If you have labs (blood work) drawn today and your tests are completely normal, you will receive your results only by: [facility] have MyChart) OR A paper copy in the mail If you have any lab test that is abnormal or we need to change your treatment, we will call you to review the results.    Follow-Up:  2 WEEKS WITH AN EXTENDER IN THE OFFICE--PLEASE SCHEDULE WITH AN EXTENDER PER DR. PEMBERTJohney Frameortant Information About Sugar          I,Alexis Herring,acting as a scribe for HeatherFreada Bergeronave documented all relevant documentation on the behalf of HeatherFreada [name] directed by  HeatherFreada Bergeronhile in the presence of HeatherFreada BergeronI, HeatherFreada Bergeronave reviewed all documentation for this visit. The documentation on 04/10/22 for the exam, diagnosis, procedures, and orders are all accurate and complete.   Signed, HeatherFreada Bergeron1/15/2023 1:02 PM    [facility]

## 2022-04-10 NOTE — Patient Instructions (Signed)
Medication Instructions:   STOP TAKING METOPROLOL TARTRATE (LOPRESSOR) NOW  START TAKING METOPROLOL SUCCINATE (TOPROL XL) 75 MG BY MOUTH DAILY  START TAKING FARXIGA 10 MG BY MOUTH DAILY BEFORE BREAKFAST  *If you need a refill on your cardiac medications before your next appointment, please call your pharmacy*   Lab Work:  TODAY--BMET AND PRO-BNP  If you have labs (blood work) drawn today and your tests are completely normal, you will receive your results only by: [place] (if you have MyChart) OR A paper copy in the mail If you have any lab test that is abnormal or we need to change your treatment, we will call you to review the results.    Follow-Up:  2 WEEKS WITH AN EXTENDER IN THE OFFICE--PLEASE SCHEDULE WITH AN EXTENDER PER DR. Johney Frame    Important Information About Sugar

## 2022-04-11 ENCOUNTER — Telehealth: Payer: Self-pay | Admitting: *Deleted

## 2022-04-11 DIAGNOSIS — I5022 Chronic systolic (congestive) heart failure: Secondary | ICD-10-CM

## 2022-04-11 DIAGNOSIS — N183 Chronic kidney disease, stage 3 unspecified: Secondary | ICD-10-CM

## 2022-04-11 DIAGNOSIS — Z79899 Other long term (current) drug therapy: Secondary | ICD-10-CM

## 2022-04-11 DIAGNOSIS — R7989 Other specified abnormal findings of blood chemistry: Secondary | ICD-10-CM

## 2022-04-11 DIAGNOSIS — I5043 Acute on chronic combined systolic (congestive) and diastolic (congestive) heart failure: Secondary | ICD-10-CM

## 2022-04-11 LAB — BASIC METABOLIC PANEL
BUN/Creatinine Ratio: 14 (ref 10–24)
BUN: 25 mg/dL (ref 8–27)
CO2: 31 mmol/L — ABNORMAL HIGH (ref 20–29)
Calcium: 8.4 mg/dL — ABNORMAL LOW (ref 8.6–10.2)
Chloride: 102 mmol/L (ref 96–106)
Creatinine, Ser: 1.74 mg/dL — ABNORMAL HIGH (ref 0.76–1.27)
Glucose: 208 mg/dL — ABNORMAL HIGH (ref 70–99)
Potassium: 4.2 mmol/L (ref 3.5–5.2)
Sodium: 142 mmol/L (ref 134–144)
eGFR: 38 mL/min/{1.73_m2} — ABNORMAL LOW (ref >59)

## 2022-04-11 LAB — PRO B NATRIURETIC PEPTIDE: NT-Pro BNP: 3139 pg/mL — ABNORMAL HIGH (ref 0–486)

## 2022-04-11 MED ORDER — FUROSEMIDE 40 MG PO TABS: ORAL_TABLET | ORAL | 0 refills | Status: DC

## 2022-04-11 MED ORDER — POTASSIUM CHLORIDE CRYS ER 20 MEQ PO TBCR: EXTENDED_RELEASE_TABLET | ORAL | 0 refills | Status: DC

## 2022-04-11 NOTE — Telephone Encounter (Signed)
The patient has been notified of the result and verbalized understanding.  All questions (if any) were answered.  Pt aware that we will increase his lasix to 40 mg po BID x 5 days, then he will reduce this back to taking lasix 40 mg po daily thereafter.   Pt aware that during the duration of his 5 day increase of lasix, he will take KDUR 20 mEq po daily for those 5 days, then he will not need this thereafter.  He is aware he will need to come into the office for repeat bmet in 10 days.  Scheduled his repeat BMET on 04/22/22.  Confirmed the pharmacy of choice with the pt.   Pt verbalized understanding and agrees with this plan.

## 2022-04-11 NOTE — Addendum Note (Signed)
Addended by: Nuala Alpha on: 04/11/2022 12:56 PM   Modules accepted: Orders

## 2022-04-11 NOTE — Telephone Encounter (Signed)
-----   Message from Freada Bergeron, MD sent at 04/11/2022  8:15 AM EST ----- His fluid levels are significantly elevated. Cr is stable. Can we increase his lasix to '40mg'$  BID with 53mq of potassium daily x5 days. He then can go back to his lasix '40mg'$  PO daily without potassium supplementation. Repeat BMET in 10 days.

## 2022-04-12 ENCOUNTER — Telehealth: Payer: Self-pay | Admitting: Cardiology

## 2022-04-12 NOTE — Telephone Encounter (Signed)
Pt c/o medication issue:  1. Name of Medication: dapagliflozin propanediol (FARXIGA) 10 MG TABS tablet   2. How are you currently taking this medication (dosage and times per day)?   Take 1 tablet (10 mg total) by mouth daily before breakfast.    3. Are you having a reaction (difficulty breathing--STAT)? no  4. What is your medication issue? Patient can't afford the medication its was $600 for 30 days. Please advise

## 2022-04-12 NOTE — Telephone Encounter (Signed)
Pt is calling to inform us that Erik [name] will cost him $600 a month.   We provided the pt with samples, copay card, and farxiga pt assistance paperwork at the office visit on Wednesday.   Pt was made aware that this would more than likely occur, which is why the pt assistance information was provided and he was advised to start working on the form for this.   Pt states he is wanting to sign up for pt assistance for this medication, but will more than likely need for our Prior Auth Nurse to assist him with this, when she returns to the office.  He states he will need her help with getting the assistance forms completed, due to not understanding all the information that is required for him to provide and fill out, and due to his age.    Pt aware that I will have our Prior [place] give him a call back when she returns to the office, so that she can walk him through exactly what he needs to do to get his assistance forms completed.  He states he will try to call the number provided on the form sometime next week, and do all he can do on his part at that time.   Pt was given samples at last OV and copay card for the next month.  He is very fearful of running out of this medication, while awaiting pt assistance forms to be completed.  Reassured him that we will not let him go without this medication while he's work on form completion.  Pt verbalized understanding and agrees with this plan.  He will await a return callback from [place].

## 2022-04-15 NOTE — Telephone Encounter (Signed)
All forms faxed to the contact information provided on cover sheet.   Fax confirmation received.   Will make PA Nurse aware of completion.

## 2022-04-15 NOTE — Telephone Encounter (Signed)
Pt signed his part of the pt assistance forms.  All forms are now signed and dated and will be faxed to the number on cover sheet.

## 2022-04-15 NOTE — Telephone Encounter (Signed)
Provider page signed and dated.  Page that the pt needs to sign was given to the front office check in with instructions to have the pt sign and date, then call me in the pod to come and pick up thereafter. Once pt has signed and dated his part of the form, will fax all forms together to the contact information provided on cover sheet by PA Nurse.  Will make PA Nurse aware of final completion, once done.

## 2022-04-15 NOTE — Telephone Encounter (Signed)
**Note De-Identified Ruberta Holck Obfuscation** I called the pt and completed his [place] patient assistance application for Farxiga with his assistance.  He is aware that I have e-mailed his application to Dr Jacolyn Reedy nurse so she can obtain her signature, date it, and to then leave his application in the front office so the pt can sign and date when he comes to the office today.  He thanked me for my assistance.

## 2022-04-22 ENCOUNTER — Ambulatory Visit: Payer: HMO | Attending: Internal Medicine

## 2022-04-22 NOTE — Progress Notes (Unsigned)
Office Visit    Patient Name: Erik [name] Date of Encounter: 04/23/2022  PCP:  [name], MD   [place]  Cardiologist:  Freada Bergeron, MD  Advanced Practice Provider:  No care team member to display Electrophysiologist:  None  HPI    Erik [name] is a 81 y.o. male with past medical history significant for nonobstructive CAD, PVCs, paroxysmal atrial fibrillation, nonischemic cardiomyopathy (EF 35 to 40% 2009, 55% (10, 35 to 45% in 2015 during admission for atrial fibrillation with RVR and 50 to 55% in 2016), CKD 3, DM 2, HTN, HLD who was previously followed by Dr. [name] presents today for follow-up appointment.  Per review of record, patient history of NICM with lowest EF 35 to 40% in 2009 and most recent EF 50 to 55% in 2016.  Also have a history of atrial fibrillation on Xarelto for [facility].  Was last seen in the clinic 4/23 where he was doing well from a CV standpoint.  He was having neuropathic pain.  TTE 08/2021 for monitoring of MR showed 45 to 50% LVEF with global hypokinesis, normal RV function mildly enlarged size, mild pulmonary hypertension, mild MR.  He was last seen 04/10/2022 and was doing well at that time.  He continued to have bilateral lower extremity edema right greater than left x2 to 3 months.  He reports that his nephrologist started him on Lasix 40 mg daily less than 2 weeks ago and his symptoms have slowly been improving.  He sees his nephrologist again in January for labs.  He often wears compression socks with relief as well.  Denies any orthopnea (sleeping on 1 pillow), PND, or worsening DOE or chest pain.  Discussed echocardiogram results during that visit from 4/23.  Today, he tells me that he lost about 4 pounds of fluid and feels much better.  He states his lower extremity edema has resolved.  He tells me that he tries to remain active but his activity level is limited by his left arm mobility.  He does have a small  amount of residual edema on exam today but overall is doing much better.  We have encouraged him to continue to take his Lasix 40 mg daily and keep track of his weight.  Reports no shortness of breath nor dyspnea on exertion. Reports no chest pain, pressure, or tightness. No edema, orthopnea, PND. Reports no palpitations.   Past Medical History    Past Medical History:  Diagnosis Date   Chronic combined systolic and diastolic CHF (congestive heart failure) (HCC)    CKD (chronic kidney disease), stage III (HCC)    Coronary artery disease    a. Nonobstructive by cath 2004. b. Nuc 09/2013 - no ischemia, fixed defect suggestive of possible prior infarction, felt low risk, EF 48%.    Diabetes mellitus    GERD (gastroesophageal reflux disease)    Hyperlipidemia    Hypertension    NICM (nonischemic cardiomyopathy) ([place])    a. EF 35-40% in 2009, 55% in 2010. b. 09/2013: 35-40% by echo and 48% by nuc. c. EF 50-55% by echo 12/2014.   PAD (peripheral artery disease) (HCC)    a. mild by noninvasive testing.   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    Premature ventricular contractions    Thrombocytopenia (HCC)    Trigeminal neuralgia    Past Surgical History:  Procedure Laterality Date   CARDIAC CATHETERIZATION  10/25/00, 12/29/02   CHOLECYSTECTOMY N/A 01/25/2015   Procedure: LAPAROSCOPIC CHOLECYSTECTOMY ;  Surgeon: [name], MD;  Location: [place];  Service: General;  Laterality: N/A;   SHOULDER SURGERY Left 2011   Shoulder surgery, rod inserted    Allergies  Allergies  Allergen Reactions   Claritin [Loratadine] Other (See Comments)    Urinary retention   Tradjenta [Linagliptin] Other (See Comments)    High blood sugar   Imipramine     Other reaction(s): hallucination/dizziness    EKGs/Labs/Other Studies Reviewed:   The following studies were reviewed today:  Echocardiogram  09/19/21  IMPRESSIONS     1. Left ventricular ejection  fraction, by estimation, is 45 to 50%. The  left ventricle has mildly decreased function. The left ventricle  demonstrates global hypokinesis. Left ventricular diastolic parameters are  consistent with Grade I diastolic  dysfunction (impaired relaxation). The average left ventricular global  longitudinal strain is -17.9 %.   2. Right ventricular systolic function is normal. The right ventricular  size is mildly enlarged. There is mildly elevated pulmonary artery  systolic pressure.   3. Left atrial size was mildly dilated.   4. The mitral valve is grossly normal. Mild mitral valve regurgitation.   5. The aortic valve is tricuspid. There is mild calcification of the  aortic valve. There is mild thickening of the aortic valve. Aortic valve  regurgitation is trivial. Aortic valve sclerosis/calcification is present,  without any evidence of aortic  stenosis.   Comparison(s): TTE 01/23/15 EF 50-55%, mild MR.   FINDINGS   Left Ventricle: Left ventricular ejection fraction, by estimation, is 45  to 50%. The left ventricle has mildly decreased function. The left  ventricle demonstrates global hypokinesis. The average left ventricular  global longitudinal strain is -17.9 %. 3D   left ventricular ejection fraction analysis performed but not reported  based on interpreter judgement due to suboptimal tracking. The left  ventricular internal cavity size was normal in size. There is no left  ventricular hypertrophy. Left ventricular  diastolic parameters are consistent with Grade I diastolic dysfunction  (impaired relaxation).   Right Ventricle: The right ventricular size is mildly enlarged. No  increase in right ventricular wall thickness. Right ventricular systolic  function is normal. There is mildly elevated pulmonary artery systolic  pressure. The tricuspid regurgitant  velocity is 3.16 m/s, and with an assumed right atrial pressure of 3 mmHg,  the estimated right ventricular systolic  pressure is 76.1 mmHg.   Left Atrium: Left atrial size was mildly dilated.   Right Atrium: Right atrial size was normal in size.   Pericardium: There is no evidence of pericardial effusion.   Mitral Valve: The mitral valve is grossly normal. There is mild thickening  of the mitral valve leaflet(s). There is mild calcification of the mitral  valve leaflet(s). Mild mitral annular calcification. Mild mitral valve  regurgitation.   Tricuspid Valve: The tricuspid valve is normal in structure. Tricuspid  valve regurgitation is mild.   Aortic Valve: The aortic valve is tricuspid. There is mild calcification  of the aortic valve. There is mild thickening of the aortic valve. Aortic  valve regurgitation is trivial. Aortic valve sclerosis/calcification is  present, without any evidence of  aortic stenosis.   Pulmonic Valve: The pulmonic valve was normal in structure. Pulmonic valve  regurgitation is trivial.   Aorta: The aortic root and ascending aorta are structurally normal, with  no evidence of dilitation.   Venous: The inferior vena cava was not well  visualized.   IAS/Shunts: The atrial septum is grossly normal.   EKG:  EKG is not ordered today.   Recent Labs: 09/03/2021: Hemoglobin 11.7; Platelets 102 04/10/2022: BUN 25; Creatinine, Ser 1.74; NT-Pro BNP 3,139; Potassium 4.2; Sodium 142  Recent Lipid Panel    Component Value Date/Time   CHOL 115 09/03/2021 0839   TRIG 95 09/03/2021 0839   HDL 42 09/03/2021 0839   CHOLHDL 2.7 09/03/2021 0839   LDLCALC 55 09/03/2021 0839     Home Medications   Current Meds  Medication Sig   acetaminophen (TYLENOL) 325 MG tablet Take 2 tablets (650 mg total) by mouth every 6 (six) hours as needed for mild pain (or Fever >/= 101).   atorvastatin (LIPITOR) 40 MG tablet Take 1 tablet by mouth daily.   BD PEN NEEDLE NANO U/F 32G X 4 MM MISC USE 1 NEEDLE WITH INSULIN INJECTION ONCE DAILY   Blood Glucose Calibration (TRUE METRIX LEVEL 1) Low  SOLN USE TO CALIBRATE GLUCOMETER ONCE MONTHY OR WITH EACH NEW VIAL OF STRIPS, WHICHEVER COMES FIRST   Blood Glucose Monitoring Suppl (TRUE METRIX AIR GLUCOSE METER) w/Device KIT use to check blood sugar three times daily prior to meals. E11.29   Coenzyme Q10 (COQ10) 200 MG CAPS Take 100 mg by mouth daily.    Continuous Blood Gluc Receiver (FREESTYLE LIBRE 2 READER) DEVI See admin instructions.   Continuous Blood Gluc Sensor (FREESTYLE LIBRE 2 SENSOR) MISC Inject into the skin every 14 (fourteen) days.   dapagliflozin propanediol (FARXIGA) 10 MG TABS tablet Take 1 tablet (10 mg total) by mouth daily before breakfast.   finasteride (PROSCAR) 5 MG tablet Take 5 mg by mouth once daily   gabapentin (NEURONTIN) 300 MG capsule Take 1 capsule (300 mg total) by mouth 3 (three) times daily.   glucose blood (TRUE METRIX BLOOD GLUCOSE TEST) test strip use to check blood sugar three times daily prior to meals. E11.29   ipratropium (ATROVENT) 0.03 % nasal spray    metoprolol succinate (TOPROL XL) 50 MG 24 hr tablet Take 1.5 tablets (75 mg total) by mouth daily. Take with or immediately following a meal.   moxifloxacin (VIGAMOX) 0.5 % ophthalmic solution Place 1 drop into the left eye 4 (four) times daily.   Multiple Vitamin (MULTIVITAMIN) capsule Take 1 capsule by mouth daily.   OXcarbazepine (TRILEPTAL) 150 MG tablet Take 1 tablet (150 mg total) by mouth 2 (two) times daily.   pantoprazole (PROTONIX) 20 MG tablet Take 20 mg by mouth daily.   potassium chloride SA (KLOR-CON M) 20 MEQ tablet Take 1 tablet (20 mEq total) by mouth daily for 5 days only.  Take in concurrence with 5 day increased lasix.   PROLENSA 0.07 % SOLN    Rivaroxaban (XARELTO) 15 MG TABS tablet TAKE 1 TABLET(15 MG) BY MOUTH DAILY WITH SUPPER   selenium 200 MCG TABS tablet Take 200 mcg by mouth daily.   tamsulosin (FLOMAX) 0.4 MG CAPS capsule Take 0.4 mg by mouth at bedtime.   TOUJEO SOLOSTAR 300 UNIT/ML SOPN Inject 36 Units as directed  daily.    Trospium Chloride 60 MG CP24    TRUEplus Lancets 33G MISC use to check blood sugar three times daily prior to meals. E11.29   [DISCONTINUED] furosemide (LASIX) 40 MG tablet Take 1 tablet (40 mg total) by mouth twice daily for 5 days only, then decrease to taking 1 tablet (40 mg total) by mouth daily thereafter.     Review of Systems  All other systems reviewed and are otherwise negative except as noted above.  Physical Exam    VS:  BP (!) 112/50   Pulse 63   Ht _0  (1.778 m)   Wt 191 lb (86.6 kg)   SpO2 98%   BMI 27.41 kg/m  , BMI Body mass index is 27.41 kg/m.  Wt Readings from Last 3 Encounters:  04/23/22 191 lb (86.6 kg)  04/10/22 184 lb 3.2 oz (83.6 kg)  02/06/22 192 lb (87.1 kg)     GEN: Well nourished, well developed, in no acute distress. HEENT: normal. Neck: Supple, no JVD, carotid bruits, or masses. Cardiac: RRR, no murmurs, rubs, or gallops. No clubbing, cyanosis, trace bilateral edema.  Radials/PT 2+ and equal bilaterally.  Respiratory:  Respirations regular and unlabored, clear to auscultation bilaterally. GI: Soft, nontender, nondistended. MS: No deformity or atrophy. Skin: Warm and dry, no rash. Neuro:  Strength and sensation are intact. Psych: Normal affect.  Assessment & Plan    Acute on chronic combined systolic and diastolic heart failure/NICM -farxiga is too expensive and he is working to get it more affordable -Continue Lasix 40 mg daily  Coronary artery disease -no chest pain at this time -Continue GDMT Lipitor 40 mg daily, Farxiga 10 mg daily, Cardizem CD100 20 mg daily, Lasix 40 mg daily, metoprolol succinate 75 mg daily, Xarelto 15 mg daily   Stage III chronic kidney disease -Follow-up with nephrology   Paroxysmal atrial fibrillation -He is asymptomatic  -xarelto for anticoagulation -CHA2DS2-VASc score of 6 -Continue metoprolol 75 mg XL daily and diltiazem 120 mg daily  Hypertension -BP well controlled  today -continue current medications  Hyperlipidemia -LDL 55 back in 4/23, at goal         Disposition: Follow up 3 months with Freada Bergeron, MD or APP.  Signed, Elgie Collard, PA-C 04/23/2022, 5:06 PM [facility]

## 2022-04-23 ENCOUNTER — Encounter: Payer: Self-pay | Admitting: Physician Assistant

## 2022-04-23 ENCOUNTER — Ambulatory Visit: Payer: HMO | Attending: Physician Assistant | Admitting: Physician Assistant

## 2022-04-23 VITALS — BP 112/50 | HR 63 | Ht 70.0 in | Wt 191.0 lb

## 2022-04-23 DIAGNOSIS — I428 Other cardiomyopathies: Secondary | ICD-10-CM

## 2022-04-23 DIAGNOSIS — D6869 Other thrombophilia: Secondary | ICD-10-CM

## 2022-04-23 DIAGNOSIS — I5022 Chronic systolic (congestive) heart failure: Secondary | ICD-10-CM | POA: Diagnosis not present

## 2022-04-23 DIAGNOSIS — Z79899 Other long term (current) drug therapy: Secondary | ICD-10-CM

## 2022-04-23 DIAGNOSIS — I2583 Coronary atherosclerosis due to lipid rich plaque: Secondary | ICD-10-CM

## 2022-04-23 DIAGNOSIS — I5043 Acute on chronic combined systolic (congestive) and diastolic (congestive) heart failure: Secondary | ICD-10-CM

## 2022-04-23 DIAGNOSIS — I480 Paroxysmal atrial fibrillation: Secondary | ICD-10-CM | POA: Diagnosis not present

## 2022-04-23 DIAGNOSIS — I10 Essential (primary) hypertension: Secondary | ICD-10-CM

## 2022-04-23 DIAGNOSIS — N183 Chronic kidney disease, stage 3 unspecified: Secondary | ICD-10-CM

## 2022-04-23 DIAGNOSIS — R7989 Other specified abnormal findings of blood chemistry: Secondary | ICD-10-CM

## 2022-04-23 DIAGNOSIS — I2510 Atherosclerotic heart disease of native coronary artery without angina pectoris: Secondary | ICD-10-CM | POA: Diagnosis not present

## 2022-04-23 DIAGNOSIS — E782 Mixed hyperlipidemia: Secondary | ICD-10-CM

## 2022-04-23 MED ORDER — FUROSEMIDE 40 MG PO TABS: ORAL_TABLET | ORAL | 5 refills | Status: DC

## 2022-04-23 NOTE — Telephone Encounter (Signed)
Forms signed and dated by Dr. Johney Frame.   Forms faxed to the contact information provided on cover sheet.   Confirmation fax received.   Will make PA Nurse aware of completion.

## 2022-04-23 NOTE — Patient Instructions (Signed)
Medication Instructions:  Your physician has recommended you make the following change in your medication:  1.Take lasix 40 mg every day *If you need a refill on your cardiac medications before your next appointment, please call your pharmacy*   Lab Work: None If you have labs (blood work) drawn today and your tests are completely normal, you will receive your results only by: [place] (if you have MyChart) OR A paper copy in the mail If you have any lab test that is abnormal or we need to change your treatment, we will call you to review the results.   Follow-Up: At [facility], you and your health needs are our priority.  As part of our continuing mission to provide you with exceptional heart care, we have created designated Provider Care Teams.  These Care Teams include your primary Cardiologist (physician) and Advanced Practice Providers (APPs -  Physician Assistants and Nurse Practitioners) who all work together to provide you with the care you need, when you need it.  Your next appointment:   6 month(s)  The format for your next appointment:   In Person  Provider:   Freada Bergeron, MD    Other Instructions Weigh yourself every morning after using the restroom, before breakfast and keep a log  Important Information About Sugar

## 2022-04-23 NOTE — Telephone Encounter (Signed)
**Note De-Identified Erik [name] Obfuscation** Letter received from [facility] and Me stating that the pts application that we faxed to them on 11/20 was an old version.  I have completed the providers page of a new [place] pt asst application for Farxiga assistance and have e-mailed the pts application to Dr Jacolyn Reedy nurse so she can obtain her signature, date it, and to fax all to [facility] and Me at the fax number written on the cover letter.

## 2022-04-25 ENCOUNTER — Encounter: Payer: Self-pay | Admitting: Podiatry

## 2022-05-09 ENCOUNTER — Telehealth: Payer: Self-pay | Admitting: Cardiology

## 2022-05-09 NOTE — Telephone Encounter (Signed)
Pt c/o medication issue:  1. Name of Medication:   dapagliflozin propanediol (FARXIGA) 10 MG TABS tablet   2. How are you currently taking this medication (dosage and times per day)? As prescribed  3. Are you having a reaction (difficulty breathing--STAT)?   No  4. What is your medication issue?    Patient stated he would like to get samples of this medication.  Patient stated he has 5 pills left.

## 2022-05-09 NOTE — Telephone Encounter (Signed)
Pt is calling to ask if he can get further samples of farxiga 10 mg, for he is still awaiting the approval of his pt assistance forms that [place] sent in, in late November.    Informed the pt that I was able to get 2 more weeks of samples of this medication for the pt, until Jeani Hawking gets back next week and can follow-up on where we're at with the approval of this medication from pt assistance.  He is aware that I will route this message to [facility] Via PA Nurse to follow-up with him next week to endorse the status of pt assistance approval for Farxiga.  He is aware that I will leave him his samples at the front desk to pick up tomorrow.  Pt verbalized understanding and agrees with this plan.

## 2022-05-15 NOTE — Telephone Encounter (Signed)
**Note De-Identified Kayzen Kendzierski Obfuscation** The pt states that he received 3 bottles of Farxiga with 30 tablets in each but he does not know who sent them to him. I asked him if a letter came with the [place] and he stated that one was with the shipment but that he may have threw it away. He states that he will look for it.  I advised him that I have not received a letter from [facility] and [place] but that it sounds like they have approved him since he received a 90 day supply through the mail.  I provided him [place] [employer] [place] phone number so he can call to ask details about his application like did they approve him and if so, for how long is he was approved for. Otherwise, I advised him that I do not know who sent him the [place].  He thanked me for calling him back.

## 2022-05-15 NOTE — Telephone Encounter (Signed)
Pt is calling back to confirm that he has been approved for the medication Farxiga. Just FYI.

## 2022-06-26 DIAGNOSIS — E1122 Type 2 diabetes mellitus with diabetic chronic kidney disease: Secondary | ICD-10-CM | POA: Diagnosis not present

## 2022-06-26 DIAGNOSIS — N183 Chronic kidney disease, stage 3 unspecified: Secondary | ICD-10-CM | POA: Diagnosis not present

## 2022-06-26 DIAGNOSIS — G500 Trigeminal neuralgia: Secondary | ICD-10-CM | POA: Diagnosis not present

## 2022-06-26 DIAGNOSIS — R809 Proteinuria, unspecified: Secondary | ICD-10-CM | POA: Diagnosis not present

## 2022-06-26 DIAGNOSIS — I428 Other cardiomyopathies: Secondary | ICD-10-CM | POA: Diagnosis not present

## 2022-06-26 DIAGNOSIS — D631 Anemia in chronic kidney disease: Secondary | ICD-10-CM | POA: Diagnosis not present

## 2022-06-26 DIAGNOSIS — H10413 Chronic giant papillary conjunctivitis, bilateral: Secondary | ICD-10-CM | POA: Diagnosis not present

## 2022-06-26 DIAGNOSIS — L570 Actinic keratosis: Secondary | ICD-10-CM | POA: Diagnosis not present

## 2022-06-26 DIAGNOSIS — Q631 Lobulated, fused and horseshoe kidney: Secondary | ICD-10-CM | POA: Diagnosis not present

## 2022-06-26 DIAGNOSIS — L218 Other seborrheic dermatitis: Secondary | ICD-10-CM | POA: Diagnosis not present

## 2022-06-26 DIAGNOSIS — N179 Acute kidney failure, unspecified: Secondary | ICD-10-CM | POA: Diagnosis not present

## 2022-06-26 DIAGNOSIS — N189 Chronic kidney disease, unspecified: Secondary | ICD-10-CM | POA: Diagnosis not present

## 2022-06-26 DIAGNOSIS — N2581 Secondary hyperparathyroidism of renal origin: Secondary | ICD-10-CM | POA: Diagnosis not present

## 2022-06-26 DIAGNOSIS — E113293 Type 2 diabetes mellitus with mild nonproliferative diabetic retinopathy without macular edema, bilateral: Secondary | ICD-10-CM | POA: Diagnosis not present

## 2022-06-26 DIAGNOSIS — I5189 Other ill-defined heart diseases: Secondary | ICD-10-CM | POA: Diagnosis not present

## 2022-06-26 DIAGNOSIS — I129 Hypertensive chronic kidney disease with stage 1 through stage 4 chronic kidney disease, or unspecified chronic kidney disease: Secondary | ICD-10-CM | POA: Diagnosis not present

## 2022-06-26 DIAGNOSIS — H5203 Hypermetropia, bilateral: Secondary | ICD-10-CM | POA: Diagnosis not present

## 2022-06-26 DIAGNOSIS — D0462 Carcinoma in situ of skin of left upper limb, including shoulder: Secondary | ICD-10-CM | POA: Diagnosis not present

## 2022-06-28 ENCOUNTER — Ambulatory Visit: Payer: HMO | Admitting: Podiatry

## 2022-07-01 ENCOUNTER — Other Ambulatory Visit: Payer: Self-pay

## 2022-07-01 ENCOUNTER — Encounter (HOSPITAL_COMMUNITY): Payer: Self-pay | Admitting: *Deleted

## 2022-07-01 ENCOUNTER — Ambulatory Visit (HOSPITAL_COMMUNITY)
Admission: EM | Admit: 2022-07-01 | Discharge: 2022-07-01 | Disposition: A | Discharge status: Home / Self Care | Payer: PPO | Attending: Family Medicine | Admitting: Family Medicine

## 2022-07-01 DIAGNOSIS — E11628 Type 2 diabetes mellitus with other skin complications: Secondary | ICD-10-CM

## 2022-07-01 DIAGNOSIS — L089 Local infection of the skin and subcutaneous tissue, unspecified: Secondary | ICD-10-CM | POA: Diagnosis not present

## 2022-07-01 DIAGNOSIS — L84 Corns and callosities: Secondary | ICD-10-CM | POA: Diagnosis not present

## 2022-07-01 MED ORDER — AMOXICILLIN-POT CLAVULANATE 875-125 MG PO TABS: 1.0000 | ORAL_TABLET | Freq: Two times a day (BID) | ORAL | 0 refills | Status: AC

## 2022-07-01 NOTE — ED Triage Notes (Signed)
Sore on planter side of foot

## 2022-07-01 NOTE — Discharge Instructions (Addendum)
Take amoxicillin-clavulanate 875 mg--1 tab twice daily with food for 7 days  Soak the foot in warm water 2-3 times a day for about 10 minutes.  Please call your podiatrist in the morning to get an appointment soon.  The podiatrist is the one who needs to treat your callus and ulceration on your foot.

## 2022-07-01 NOTE — ED Provider Notes (Signed)
[place]    CSN: [id number] Arrival date & time: 07/01/22  1524      History   Chief Complaint Chief Complaint  Patient presents with   Toe Pain    HPI Erik [name] is a 82 y.o. male.    Toe Pain   Here for a sore on his right foot on the ball of the foot.  It showed up in the last 2 to 3 days.  He does have a history of diabetes, and has not had any fever.  No drainage from this area.  He has seen a podiatrist in the past and is appointment was canceled for February 2 for follow-up.  He states that months ago he was here and someone drained an abscess off his foot.  On review of the chart he had debridement of keratinized tissue over his bunion in April when he had some serosanguineous drainage there.  Past Medical History:  Diagnosis Date   Chronic combined systolic and diastolic CHF (congestive heart failure) (HCC)    CKD (chronic kidney disease), stage III (HCC)    Coronary artery disease    a. Nonobstructive by cath 2004. b. Nuc 09/2013 - no ischemia, fixed defect suggestive of possible prior infarction, felt low risk, EF 48%.    Diabetes mellitus    GERD (gastroesophageal reflux disease)    Hyperlipidemia    Hypertension    NICM (nonischemic cardiomyopathy) ([place])    a. EF 35-40% in 2009, 55% in 2010. b. 09/2013: 35-40% by echo and 48% by nuc. c. EF 50-55% by echo 12/2014.   PAD (peripheral artery disease) (HCC)    a. mild by noninvasive testing.   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    Premature ventricular contractions    Thrombocytopenia ([place])    Trigeminal neuralgia     Patient Active Problem List   Diagnosis Date Noted   Annual physical exam 08/16/2020   PAD (peripheral artery disease) ([place]) 11/02/2019   Visual field defect 08/12/2019   Pain due to onychomycosis of toenail of right foot 07/02/2019   Diabetic neuropathy ([place]) 03/31/2019   Plantar flexed metatarsal bone of left foot  03/31/2019   Plantar flexed metatarsal bone of right foot 03/31/2019   Porokeratosis 03/31/2019   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   Thrombocytopenia ([place]) 08/03/2018   Hyperlipidemia    Benign neoplasm of colon 40/98/1191   Systolic CHF ([place]) 47/82/9562   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   Non-thrombocytopenic purpura ([place]) 02/23/2015   Generalized anxiety disorder 02/09/2015   Localized edema 02/09/2015   Acute biliary pancreatitis    Acute on chronic renal failure (HCC)    Pancreatitis 01/22/2015   Acute pancreatitis 01/22/2015   Abdominal pain    Gallstones    Acute gallstone pancreatitis    Actinic keratosis 07/13/2014   Low back pain 01/11/2014   Long term (current) use of anticoagulants 09/29/2013   CKD (chronic kidney disease), stage III (HCC)    Atrial fibrillation (HCC)    Coronary artery disease    NICM (nonischemic cardiomyopathy) ([place])    Hypertension    GERD (gastroesophageal reflux disease)    Premature ventricular contractions    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    Chest pain 09/23/2013   Elevated PSA 07/05/2013   Cellulitis 12/02/2012   Diabetes mellitus ([place]) 12/02/2012   Proteinuria 07/07/2012   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   Benign  prostatic hyperplasia with lower urinary tract symptoms 03/09/2009   Diabetic renal disease ([place]) 03/09/2009   Pure hypercholesterolemia 03/09/2009   HYPERLIPIDEMIA 10/11/2008    Past Surgical History:  Procedure Laterality Date   CARDIAC CATHETERIZATION  10/25/00, 12/29/02   CHOLECYSTECTOMY N/A 01/25/2015   Procedure: LAPAROSCOPIC CHOLECYSTECTOMY ;  Surgeon: [name], MD;  Location: [facility];  Service: General;  Laterality: N/A;   SHOULDER SURGERY Left 2011   Shoulder surgery, rod inserted       Home Medications    Prior to Admission medications   Medication Sig Start Date End Date Taking? Authorizing Provider  amoxicillin-clavulanate (AUGMENTIN) 875-125 MG tablet  Take 1 tablet by mouth 2 (two) times daily for 7 days. 07/01/22 07/08/22 Yes Fransheska Willingham, Gwenlyn Perking, MD  acetaminophen (TYLENOL) 325 MG tablet Take 2 tablets (650 mg total) by mouth every 6 (six) hours as needed for mild pain (or Fever >/= 101). 01/29/15   Elgergawy, Silver Huguenin, MD  atorvastatin (LIPITOR) 40 MG tablet Take 1 tablet by mouth daily.    [provider]  BD PEN NEEDLE NANO U/F 32G X 4 MM MISC USE 1 NEEDLE WITH INSULIN INJECTION ONCE DAILY 03/04/18   [provider]  Blood Glucose Calibration (TRUE METRIX LEVEL 1) Low SOLN USE TO CALIBRATE GLUCOMETER ONCE MONTHY OR WITH EACH NEW VIAL OF STRIPS, WHICHEVER COMES FIRST 10/13/17   [provider]  Blood Glucose Monitoring Suppl (TRUE METRIX AIR GLUCOSE METER) w/Device KIT use to check blood sugar three times daily prior to meals. E11.29 10/09/17   [provider]  Coenzyme Q10 (COQ10) 200 MG CAPS Take 100 mg by mouth daily.     [provider]  Continuous Blood Gluc Receiver (FREESTYLE LIBRE 2 READER) DEVI See admin instructions. 12/26/21   [provider]  Continuous Blood Gluc Sensor (FREESTYLE LIBRE 2 SENSOR) MISC Inject into the skin every 14 (fourteen) days. 03/19/22   [provider]  dapagliflozin propanediol (FARXIGA) 10 MG TABS tablet Take 1 tablet (10 mg total) by mouth daily before breakfast. 04/10/22   Freada Bergeron, MD  finasteride (PROSCAR) 5 MG tablet Take 5 mg by mouth once daily 03/06/17   [provider]  furosemide (LASIX) 40 MG tablet Take 1 tablet (40 mg total) by mouth daily 04/23/22   Elgie Collard, PA-C  gabapentin (NEURONTIN) 300 MG capsule Take 1 capsule (300 mg total) by mouth 3 (three) times daily. 06/20/21   Patel, Arvin Collard K, DO  glucose blood (TRUE METRIX BLOOD GLUCOSE TEST) test strip use to check blood sugar three times daily prior to meals. E11.29 10/09/17   [provider]  ipratropium (ATROVENT) 0.03 % nasal spray  06/05/20   [provider]  metoprolol succinate (TOPROL XL) 50 MG 24 hr tablet Take 1.5 tablets (75 mg total) by mouth daily. Take with or immediately following a meal. 04/10/22   Pemberton, Greer Ee, MD  moxifloxacin (VIGAMOX) 0.5 % ophthalmic solution Place 1 drop into the left eye 4 (four) times daily. 11/11/19   [provider]  Multiple Vitamin (MULTIVITAMIN) capsule Take 1 capsule by mouth daily.    [provider]  OXcarbazepine (TRILEPTAL) 150 MG tablet Take 1 tablet (150 mg total) by mouth 2 (two) times daily. 02/19/22   Patel, Arvin Collard K, DO  pantoprazole (PROTONIX) 20 MG tablet Take 20 mg by mouth daily. 12/25/21   [provider]  potassium chloride SA (KLOR-CON M) 20 MEQ tablet Take 1 tablet (20 mEq total)  by mouth daily for 5 days only.  Take in concurrence with 5 day increased lasix. 04/11/22   Freada Bergeron, MD  PROLENSA 0.07 % SOLN  11/11/19   [provider]  Rivaroxaban (XARELTO) 15 MG TABS tablet TAKE 1 TABLET(15 MG) BY MOUTH DAILY WITH SUPPER 06/01/21   Freada Bergeron, MD  selenium 200 MCG TABS tablet Take 200 mcg by mouth daily.    [provider]  tamsulosin (FLOMAX) 0.4 MG CAPS capsule Take 0.4 mg by mouth at bedtime.    [provider]  TOUJEO SOLOSTAR 300 UNIT/ML SOPN Inject 36 Units as directed daily.  03/12/16   [provider]  Trospium Chloride 60 MG CP24  02/02/18   [provider]  TRUEplus Lancets 33G MISC use to check blood sugar three times daily prior to meals. E11.29 10/09/17   [provider]    Family History Family History  Problem Relation Age of Onset   Heart disease Mother    Diabetes Mother    Hypertension Mother    Heart disease Father    Heart disease Brother    Diabetes Brother    Hypertension Brother    Heart attack Neg Hx    Stroke Neg Hx     Social History Social History   Tobacco Use   Smoking status: Former    Packs/day: 0.50    Years: 10.00    Total pack  years: 5.00    Types: Cigarettes    Quit date: 01/21/1985    Years since quitting: 37.4   Smokeless tobacco: Never   Tobacco comments:    quit 35 yrs ago  Vaping Use   Vaping Use: Never used  Substance Use Topics   Alcohol use: No   Drug use: No     Allergies   Claritin [loratadine], Tradjenta [linagliptin], and Imipramine   Review of Systems Review of Systems   Physical Exam Triage Vital Signs ED Triage Vitals  Enc Vitals Group     BP 07/01/22 1739 (!) 109/35     Pulse Rate 07/01/22 1739 (!) 54     Resp 07/01/22 1739 18     Temp 07/01/22 1739 97.8 F (36.6 C)     Temp src --      SpO2 07/01/22 1739 96 %     Weight --      Height --      Head Circumference --      Peak Flow --      Pain Score 07/01/22 1735 2     Pain Loc --      Pain Edu? --      Excl. in [place]? --    No data found.  Updated Vital Signs BP (!) 109/35   Pulse (!) 54   Temp 97.8 F (36.6 C)   Resp 18   SpO2 96%   Visual Acuity Right Eye Distance:   Left Eye Distance:   Bilateral Distance:    Right Eye Near:   Left Eye Near:    Bilateral Near:     Physical Exam Vitals reviewed.  Constitutional:      General: He is not in acute distress.    Appearance: He is not ill-appearing, toxic-appearing or diaphoretic.  Skin:    Coloration: Skin is not pale.     Comments: On the lateral ball of the right foot there is a keratinized area about 1 cm in diameter.  There is a small rim of mildly erythematous tissue  around this.  I do not palpate any fluctuance.  There is no drainage  Neurological:     Mental Status: He is alert and oriented to person, place, and time.  Psychiatric:        Behavior: Behavior normal.      UC Treatments / Results  Labs (all labs ordered are listed, but only abnormal results are displayed) Labs Reviewed - No data to display  EKG   Radiology No results found.  Procedures Procedures (including critical care time)  Medications Ordered in UC Medications -  No data to display  Initial Impression / Assessment and Plan / UC Course  I have reviewed the triage vital signs and the nursing notes.  Pertinent labs & imaging results that were available during my care of the patient were reviewed by me and considered in my medical decision making (see chart for details).     I discussed with the patient that I do not think he has an abscess to be drained at present.  I feel that he should have debridement done with the podiatry specialist.  Augmentin has begun today. Final Clinical Impressions(s) / UC Diagnoses   Final diagnoses:  Diabetic foot infection ([place])  Corn or callus     Discharge Instructions      Take amoxicillin-clavulanate 875 mg--1 tab twice daily with food for 7 days  Soak the foot in warm water 2-3 times a day for about 10 minutes.  Please call your podiatrist in the morning to get an appointment soon.  The podiatrist is the one who needs to treat your callus and ulceration on your foot.     ED Prescriptions     Medication Sig Dispense Auth. Provider   amoxicillin-clavulanate (AUGMENTIN) 875-125 MG tablet Take 1 tablet by mouth 2 (two) times daily for 7 days. 14 tablet Ardyth Kelso, Gwenlyn Perking, MD      PDMP not reviewed this encounter.   Barrett Henle, MD 07/01/22 [phone]

## 2022-07-01 NOTE — ED Triage Notes (Signed)
Pt reports a sore on Rt small toe for 6 months.

## 2022-07-11 ENCOUNTER — Ambulatory Visit (INDEPENDENT_AMBULATORY_CARE_PROVIDER_SITE_OTHER): Payer: PPO | Admitting: Podiatry

## 2022-07-11 DIAGNOSIS — Q828 Other specified congenital malformations of skin: Secondary | ICD-10-CM | POA: Diagnosis not present

## 2022-07-11 MED ORDER — DOXYCYCLINE HYCLATE 100 MG PO TABS: 100.0000 mg | ORAL_TABLET | Freq: Two times a day (BID) | ORAL | 0 refills | Status: DC

## 2022-07-11 NOTE — Progress Notes (Signed)
Subjective:  Patient ID: Erik [name], male    DOB: 11-18-1940,  MRN: [id number]  Chief Complaint  Patient presents with   Callouses    Possible ulcer right foot below 5th toe     82 y.o. male presents with the above complaint.  Patient presents with right submetatarsal 5 porokeratotic lesion painful to touch is progressive gotten worse worse with ambulation hurts with pressure.  He wants to make sure that there is no ulceration.  He is a diabetic and is worried that given his pain he might have a sore developing.  He would like to discuss treatment options   Review of Systems: Negative except as noted in the HPI. Denies N/V/F/Ch.  Past Medical History:  Diagnosis Date   Chronic combined systolic and diastolic CHF (congestive heart failure) (HCC)    CKD (chronic kidney disease), stage III (HCC)    Coronary artery disease    a. Nonobstructive by cath 2004. b. Nuc 09/2013 - no ischemia, fixed defect suggestive of possible prior infarction, felt low risk, EF 48%.    Diabetes mellitus    GERD (gastroesophageal reflux disease)    Hyperlipidemia    Hypertension    NICM (nonischemic cardiomyopathy) ([place])    a. EF 35-40% in 2009, 55% in 2010. b. 09/2013: 35-40% by echo and 48% by nuc. c. EF 50-55% by echo 12/2014.   PAD (peripheral artery disease) (HCC)    a. mild by noninvasive testing.   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    Premature ventricular contractions    Thrombocytopenia (HCC)    Trigeminal neuralgia     Current Outpatient Medications:    doxycycline (VIBRA-TABS) 100 MG tablet, Take 1 tablet (100 mg total) by mouth 2 (two) times daily., Disp: 20 tablet, Rfl: 0   acetaminophen (TYLENOL) 325 MG tablet, Take 2 tablets (650 mg total) by mouth every 6 (six) hours as needed for mild pain (or Fever >/= 101)., Disp: , Rfl:    atorvastatin (LIPITOR) 40 MG tablet, Take 1 tablet by mouth daily., Disp: , Rfl:    BD PEN NEEDLE NANO U/F 32G  X 4 MM MISC, USE 1 NEEDLE WITH INSULIN INJECTION ONCE DAILY, Disp: , Rfl: 2   Blood Glucose Calibration (TRUE METRIX LEVEL 1) Low SOLN, USE TO CALIBRATE GLUCOMETER ONCE MONTHY OR WITH EACH NEW VIAL OF STRIPS, WHICHEVER COMES FIRST, Disp: , Rfl:    Blood Glucose Monitoring Suppl (TRUE METRIX AIR GLUCOSE METER) w/Device KIT, use to check blood sugar three times daily prior to meals. E11.29, Disp: , Rfl:    Coenzyme Q10 (COQ10) 200 MG CAPS, Take 100 mg by mouth daily. , Disp: , Rfl:    Continuous Blood Gluc Receiver (FREESTYLE LIBRE 2 READER) DEVI, See admin instructions., Disp: , Rfl:    Continuous Blood Gluc Sensor (FREESTYLE LIBRE 2 SENSOR) MISC, Inject into the skin every 14 (fourteen) days., Disp: , Rfl:    dapagliflozin propanediol (FARXIGA) 10 MG TABS tablet, Take 1 tablet (10 mg total) by mouth daily before breakfast., Disp: 30 tablet, Rfl: 6   finasteride (PROSCAR) 5 MG tablet, Take 5 mg by mouth once daily, Disp: , Rfl: 3   furosemide (LASIX) 40 MG tablet, Take 1 tablet (40 mg total) by mouth daily, Disp: 30 tablet, Rfl: 5   gabapentin (NEURONTIN) 300 MG capsule, Take 1 capsule (300 mg total) by mouth 3 (three) times daily., Disp: 90 capsule, Rfl: 11   glucose  blood (TRUE METRIX BLOOD GLUCOSE TEST) test strip, use to check blood sugar three times daily prior to meals. E11.29, Disp: , Rfl:    ipratropium (ATROVENT) 0.03 % nasal spray, , Disp: , Rfl:    metoprolol succinate (TOPROL XL) 50 MG 24 hr tablet, Take 1.5 tablets (75 mg total) by mouth daily. Take with or immediately following a meal., Disp: 135 tablet, Rfl: 1   moxifloxacin (VIGAMOX) 0.5 % ophthalmic solution, Place 1 drop into the left eye 4 (four) times daily., Disp: , Rfl:    Multiple Vitamin (MULTIVITAMIN) capsule, Take 1 capsule by mouth daily., Disp: , Rfl:    OXcarbazepine (TRILEPTAL) 150 MG tablet, Take 1 tablet (150 mg total) by mouth 2 (two) times daily., Disp: 60 tablet, Rfl: 3   pantoprazole (PROTONIX) 20 MG tablet, Take 20  mg by mouth daily., Disp: , Rfl:    potassium chloride SA (KLOR-CON M) 20 MEQ tablet, Take 1 tablet (20 mEq total) by mouth daily for 5 days only.  Take in concurrence with 5 day increased lasix., Disp: 5 tablet, Rfl: 0   PROLENSA 0.07 % SOLN, , Disp: , Rfl:    Rivaroxaban (XARELTO) 15 MG TABS tablet, TAKE 1 TABLET(15 MG) BY MOUTH DAILY WITH SUPPER, Disp: 90 tablet, Rfl: 1   selenium 200 MCG TABS tablet, Take 200 mcg by mouth daily., Disp: , Rfl:    tamsulosin (FLOMAX) 0.4 MG CAPS capsule, Take 0.4 mg by mouth at bedtime., Disp: , Rfl:    TOUJEO SOLOSTAR 300 UNIT/ML SOPN, Inject 36 Units as directed daily. , Disp: , Rfl: 4   Trospium Chloride 60 MG CP24, , Disp: , Rfl:    TRUEplus Lancets 33G MISC, use to check blood sugar three times daily prior to meals. E11.29, Disp: , Rfl:   Social History   Tobacco Use  Smoking Status Former   Packs/day: 0.50   Years: 10.00   Total pack years: 5.00   Types: Cigarettes   Quit date: 01/21/1985   Years since quitting: 37.5  Smokeless Tobacco Never  Tobacco Comments   quit 35 yrs ago    Allergies  Allergen Reactions   Claritin [Loratadine] Other (See Comments)    Urinary retention   Tradjenta [Linagliptin] Other (See Comments)    High blood sugar   Imipramine     Other reaction(s): hallucination/dizziness   Objective:  There were no vitals filed for this visit. There is no height or weight on file to calculate BMI. Constitutional Well developed. Well nourished.  Vascular Dorsalis pedis pulses palpable bilaterally. Posterior tibial pulses palpable bilaterally. Capillary refill normal to all digits.  No cyanosis or clubbing noted. Pedal hair growth normal.  Neurologic Normal speech. Oriented to person, place, and time. Epicritic sensation to light touch grossly present bilaterally.  Dermatologic Right submetatarsal preulcerative/porokeratotic lesion.  No signs of ulceration noted.  No signs of infection noted  Orthopedic: Normal joint  ROM without pain or crepitus bilaterally. No visible deformities. No bony tenderness.   Radiographs: None Assessment:  No diagnosis found. Plan:  Patient was evaluated and treated and all questions answered.  Right submetatarsal 5 porokeratosis/preulcerative callus -All questions and concerns were discussed with the patient in extensive detail.  At this time patient skin is well reepithelialized upon debridement.  No breakdown of skin noted no ulceration noted I discussed shoe gear modification if any foot and ankle issues on future he will come back and see me.  No follow-ups on file.

## 2022-07-23 ENCOUNTER — Encounter: Payer: Self-pay | Admitting: Podiatry

## 2022-07-23 ENCOUNTER — Ambulatory Visit (INDEPENDENT_AMBULATORY_CARE_PROVIDER_SITE_OTHER): Payer: PPO | Admitting: Podiatry

## 2022-07-23 DIAGNOSIS — E1142 Type 2 diabetes mellitus with diabetic polyneuropathy: Secondary | ICD-10-CM

## 2022-07-23 DIAGNOSIS — B351 Tinea unguium: Secondary | ICD-10-CM

## 2022-07-23 DIAGNOSIS — M79674 Pain in right toe(s): Secondary | ICD-10-CM | POA: Diagnosis not present

## 2022-07-23 NOTE — Progress Notes (Signed)
This patient returns to my office for at risk foot care.  This patient requires this care by a professional since this patient will be at risk due to having CKD, Coagulation defect Diabetes and PAD. Patient is taking xarelto. Asymptomatic porokeratosis  B/L.This patient is unable to cut nails himself since the patient cannot reach his nails.These nails are painful walking and wearing shoes.     This patient presents for at risk foot care today.  General Appearance  Alert, conversant and in no acute stress.  Vascular  Dorsalis pedis and posterior tibial  pulses are palpable  bilaterally.  Capillary return is within normal limits  bilaterally. Temperature is within normal limits  bilaterally.  Neurologic  Senn-Weinstein monofilament wire test within normal limits  bilaterally. Muscle power within normal limits bilaterally.  Nails Thick disfigured discolored nails with subungual debris  from hallux to fifth toes bilaterally. No evidence of bacterial infection or drainage bilaterally.  Orthopedic  No limitations of motion  feet .  No crepitus or effusions noted.  No bony pathology or digital deformities noted. Plantar flexed fifth metatarsal  B/L. Mallet toe second right foot.  Skin  normotropic skin with no porokeratosis noted bilaterally.  No signs of infections or ulcers noted.   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 Asymptomatic blood blister second toe right foot.  Onychomycosis  Pain in right toes  Pain in left toes    Consent was obtained for treatment procedures.   Mechanical debridement of nails 1-5  bilaterally performed with a nail nipper.  Filed with dremel without incident.   Return office visit   3 months                   Told patient to return for periodic foot care and evaluation due to potential at risk complications.   Shealynn Saulnier DPM  

## 2022-09-09 DIAGNOSIS — N1832 Chronic kidney disease, stage 3b: Secondary | ICD-10-CM | POA: Diagnosis not present

## 2022-09-09 DIAGNOSIS — N529 Male erectile dysfunction, unspecified: Secondary | ICD-10-CM | POA: Diagnosis not present

## 2022-09-09 DIAGNOSIS — Z125 Encounter for screening for malignant neoplasm of prostate: Secondary | ICD-10-CM | POA: Diagnosis not present

## 2022-09-09 DIAGNOSIS — K219 Gastro-esophageal reflux disease without esophagitis: Secondary | ICD-10-CM | POA: Diagnosis not present

## 2022-09-09 DIAGNOSIS — R7989 Other specified abnormal findings of blood chemistry: Secondary | ICD-10-CM | POA: Diagnosis not present

## 2022-09-09 DIAGNOSIS — E1129 Type 2 diabetes mellitus with other diabetic kidney complication: Secondary | ICD-10-CM | POA: Diagnosis not present

## 2022-09-09 DIAGNOSIS — E7800 Pure hypercholesterolemia, unspecified: Secondary | ICD-10-CM | POA: Diagnosis not present

## 2022-09-16 DIAGNOSIS — I480 Paroxysmal atrial fibrillation: Secondary | ICD-10-CM | POA: Diagnosis not present

## 2022-09-16 DIAGNOSIS — N401 Enlarged prostate with lower urinary tract symptoms: Secondary | ICD-10-CM | POA: Diagnosis not present

## 2022-09-16 DIAGNOSIS — R972 Elevated prostate specific antigen [PSA]: Secondary | ICD-10-CM | POA: Diagnosis not present

## 2022-09-16 DIAGNOSIS — D6869 Other thrombophilia: Secondary | ICD-10-CM | POA: Diagnosis not present

## 2022-09-16 DIAGNOSIS — E1129 Type 2 diabetes mellitus with other diabetic kidney complication: Secondary | ICD-10-CM | POA: Diagnosis not present

## 2022-09-16 DIAGNOSIS — Z1331 Encounter for screening for depression: Secondary | ICD-10-CM | POA: Diagnosis not present

## 2022-09-16 DIAGNOSIS — I130 Hypertensive heart and chronic kidney disease with heart failure and stage 1 through stage 4 chronic kidney disease, or unspecified chronic kidney disease: Secondary | ICD-10-CM | POA: Diagnosis not present

## 2022-09-16 DIAGNOSIS — E11628 Type 2 diabetes mellitus with other skin complications: Secondary | ICD-10-CM | POA: Diagnosis not present

## 2022-09-16 DIAGNOSIS — Z794 Long term (current) use of insulin: Secondary | ICD-10-CM | POA: Diagnosis not present

## 2022-09-16 DIAGNOSIS — Z1339 Encounter for screening examination for other mental health and behavioral disorders: Secondary | ICD-10-CM | POA: Diagnosis not present

## 2022-09-16 DIAGNOSIS — I509 Heart failure, unspecified: Secondary | ICD-10-CM | POA: Diagnosis not present

## 2022-09-16 DIAGNOSIS — Z7901 Long term (current) use of anticoagulants: Secondary | ICD-10-CM | POA: Diagnosis not present

## 2022-09-16 DIAGNOSIS — Z0000 Encounter for general adult medical examination without abnormal findings: Secondary | ICD-10-CM | POA: Diagnosis not present

## 2022-09-16 DIAGNOSIS — D692 Other nonthrombocytopenic purpura: Secondary | ICD-10-CM | POA: Diagnosis not present

## 2022-09-16 DIAGNOSIS — N1832 Chronic kidney disease, stage 3b: Secondary | ICD-10-CM | POA: Diagnosis not present

## 2022-09-16 DIAGNOSIS — R82998 Other abnormal findings in urine: Secondary | ICD-10-CM | POA: Diagnosis not present

## 2022-10-04 ENCOUNTER — Other Ambulatory Visit: Payer: Self-pay

## 2022-10-04 DIAGNOSIS — I5022 Chronic systolic (congestive) heart failure: Secondary | ICD-10-CM

## 2022-10-04 DIAGNOSIS — I5043 Acute on chronic combined systolic (congestive) and diastolic (congestive) heart failure: Secondary | ICD-10-CM

## 2022-10-04 DIAGNOSIS — I2510 Atherosclerotic heart disease of native coronary artery without angina pectoris: Secondary | ICD-10-CM

## 2022-10-04 DIAGNOSIS — Z79899 Other long term (current) drug therapy: Secondary | ICD-10-CM

## 2022-10-04 DIAGNOSIS — N183 Chronic kidney disease, stage 3 unspecified: Secondary | ICD-10-CM

## 2022-10-04 MED ORDER — METOPROLOL SUCCINATE ER 50 MG PO TB24: 75.0000 mg | ORAL_TABLET | Freq: Every day | ORAL | 1 refills | Status: DC

## 2022-10-08 NOTE — Progress Notes (Deleted)
Cardiology Office Note:    Date:  10/08/2022   ID:  Erik, [name] [birth year], MRN [id number]  PCP:  Erik Garbe, MD   [facility] Providers Cardiologist:  Erik Sprague, MD {  Referring MD: Erik Garbe, MD    History of Present Illness:    Erik [name] is a 82 y.o. male with a hx of nonobstructive coronary artery disease , PVCs, paroxysmal atrial fibrillation, nonischemic cardiomyopathy (EF 35-40% 2009, 55% in 2010, 35-45% 2015 during admission for atrial fibrillation with RVR and 50-55% in 2016), CKD III, DMII, HTN, and HLD who was previously followed by Dr. Delton [name] who now returns to clinic for follow-up.  Per review of the record, the patient has a history of NICM with lowest EF 35-40% in 2009 and most recent EF 50-55% in 2016. He also has a history of Afib on xarelto for [facility].   TTE 08/2021 for monitoring MR showed EF 45-50% with global hypokinesis, normal RV function with mildly enlarged size, mild PHTN, mild MR.   Was last seen in clinic on 03/2022 by Erik [name] where he was doing well from a CV standpoint. Volume status was improving.   Today, ***    Past Medical History:  Diagnosis Date   Chronic combined systolic and diastolic CHF (congestive heart failure) (HCC)    CKD (chronic kidney disease), stage III (HCC)    Coronary artery disease    a. Nonobstructive by cath 2004. b. Nuc 09/2013 - no ischemia, fixed defect suggestive of possible prior infarction, felt low risk, EF 48%.    Diabetes mellitus    GERD (gastroesophageal reflux disease)    Hyperlipidemia    Hypertension    NICM (nonischemic cardiomyopathy) (HCC)    a. EF 35-40% in 2009, 55% in 2010. b. 09/2013: 35-40% by echo and 48% by nuc. c. EF 50-55% by echo 12/2014.   PAD (peripheral artery disease) (HCC)    a. mild by noninvasive testing.   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     Allergies:   Claritin [loratadine], Tradjenta [linagliptin], and Imipramine   Social History   Socioeconomic History   Marital status: Married    Spouse name: Not on file   Number of children: Not on file   Years of education: Not on file   Highest education level: Not on file  Occupational History   Occupation: retired    Comment: from maintenance  Tobacco Use   Smoking status: Former    Packs/day: 0.50    Years: 10.00    Additional pack years: 0.00    Total pack years: 5.00    Types: Cigarettes    Quit date: 01/21/1985    Years since quitting: 37.7   Smokeless tobacco: Never   Tobacco comments:    quit 35 yrs ago  Vaping Use   Vaping Use: Never used  Substance  and Sexual Activity   Alcohol use: No   Drug use: No   Sexual activity: Not Currently    Birth control/protection: None  Other Topics Concern   Not on file  Social History Narrative   Lives with wife in a one story home.  Has 3 children.  Retired from IT trainer at [employer].     Education: high school.      Right Handed    Lives in a one story    Social Determinants of Health   Financial Resource Strain: Not on file  Food Insecurity: No Food Insecurity (02/25/2020)   Hunger Vital Sign    Worried About Running Out of Food in the Last Year: Never true    Ran Out of Food in the Last Year: Never true  Transportation Needs: Not on file  Physical Activity: Not on file   Stress: Not on file  Social Connections: Not on file     Family History: The patient's family history includes Diabetes in his brother and mother; Heart disease in his brother, father, and mother; Hypertension in his brother and mother. There is no history of Heart attack or Stroke.  ROS:   Please [name] the history of present illness.    Review of Systems  Constitutional:  Negative for chills and fever.  HENT:  Negative for nosebleeds and tinnitus.   Eyes:  Negative for blurred vision and pain.  Respiratory:  Negative for cough, hemoptysis, shortness of breath and stridor.   Cardiovascular:  Positive for leg swelling. Negative for chest pain, palpitations, orthopnea, claudication and PND.       + Lightheadedness/orthostasis  Gastrointestinal:  Negative for blood in stool, diarrhea, nausea and vomiting.  Genitourinary:  Negative for dysuria and hematuria.  Musculoskeletal:  Negative for falls.  Neurological:  Negative for dizziness, loss of consciousness and headaches.  Psychiatric/Behavioral:  Negative for depression, hallucinations and substance abuse. The patient does not have insomnia.     EKGs/Labs/Other Studies Reviewed:    The following studies were reviewed today:  TTE 08/2021: IMPRESSIONS   1. Left ventricular ejection fraction, by estimation, is 45 to 50%. The  left ventricle has mildly decreased function. The left ventricle  demonstrates global hypokinesis. Left ventricular diastolic parameters are  consistent with Grade I diastolic  dysfunction (impaired relaxation). The average left ventricular global  longitudinal strain is -17.9 %.   2. Right ventricular systolic function is normal. The right ventricular  size is mildly enlarged. There is mildly elevated pulmonary artery  systolic pressure.   3. Left atrial size was mildly dilated.   4. The mitral valve is grossly normal. Mild mitral valve regurgitation.   5. The aortic valve is tricuspid. There is mild  calcification of the  aortic valve. There is mild thickening of the aortic valve. Aortic valve  regurgitation is trivial. Aortic valve sclerosis/calcification is present,  without any evidence of aortic  stenosis.   Comparison(s): TTE 01/23/15 EF 50-55%, mild MR.    Lower Extremity Arterial Duplex 09/09/19 Summary:  Right: Atherosclerosis in the iliac, common femoral, femoral, popliteal  and tibial arteries.  <50% stenosis in the common iliac artery.  <50% stenosis in the external iliac artery.  30-49% stenosis in the mid superficial femoral artery.  Three vessel runoff.   Left: Atherosclerosis in the iliac, common femoral, femoral, popliteal and  tibial arteries.  <50% stenosis in the common iliac artery.  30-49% stenosis in the superficial femoral artery.  50-74% stenosis in the proximal/mid anterior  tibial artery.  Three vessel runoff.   TTE 2016: Study Conclusions   - Left ventricle: The cavity size was normal. Systolic function was    normal. The estimated ejection fraction was in the range of 50%    to 55%. There is hypokinesis of the basal-midinferior myocardium.    Doppler parameters are consistent with abnormal left ventricular    relaxation (grade 1 diastolic dysfunction).  - Ventricular septum: Septal motion showed paradox.  - Mitral valve: Mildly calcified annulus. There was mild    regurgitation.  - Left atrium: The atrium was mildly dilated.   Impressions:   - Compared to the prior study, there has been no significant    interval change.   EKG:  EKG ordered today, 04/10/22.  The ekg ordered today demonstrates NSR with a PVC, nonspecific ST T wave changes.  Recent Labs: 04/10/2022: BUN 25; Creatinine, Ser 1.74; NT-Pro BNP 3,139; Potassium 4.2; Sodium 142  Recent Lipid Panel    Component Value Date/Time   CHOL 115 09/03/2021 0839   TRIG 95 09/03/2021 0839   HDL 42 09/03/2021 0839   CHOLHDL 2.7 09/03/2021 0839   LDLCALC 55 09/03/2021 0839     Risk  Assessment/Calculations:           Physical Exam:    VS:  There were no vitals taken for this visit.    Wt Readings from Last 3 Encounters:  04/23/22 191 lb (86.6 kg)  04/10/22 184 lb 3.2 oz (83.6 kg)  02/06/22 192 lb (87.1 kg)     GEN: Well nourished, well developed in no acute distress HEENT: Normal NECK: No JVD; No carotid bruits CARDIAC: RRR, no murmurs, rubs, gallops RESPIRATORY:  Clear to auscultation without rales, wheezing or rhonchi  ABDOMEN: Soft, non-tender, non-distended MUSCULOSKELETAL:  1-2+ pitting edema bilaterally ; No deformity  SKIN: Warm and dry NEUROLOGIC:  Alert and oriented x 3 PSYCHIATRIC:  Normal affect   ASSESSMENT:    No diagnosis found.   PLAN:    In order of problems listed above:  # Chronic Combined Systolic and Diastolic HF: #NICM: Cath in 2004 without significant disease. TTE 2009 with EF 35-40% which  improved to 50-55% on TTE in 2016 now 45-50% on TTE 08/2021. Currently *** -Continue lasix 40mg  dialy -Cannot afford farxiga -Continue metop 75mg  XL daily -Low Na diet  #Paroxysmal Afib: CHADs-vasc 6. Doing well. Tolerating xarelto without issuses. -Continue xarelto 20mg  for AC -Continue metop 75mg  XL daily -Continue dilt 120mg  daily  #Nonobstructive CAD: No anginal symptoms. -Continue lipitor 40mg  daily -Metop as above  #HTN: *** -Continue metop 75mg  XL daily -Continue dilt 120mg  daily -Will plan to add entresto as above  #HLD: -Continue lipitor 40mg  daily -***  #Mild MR: Stable on TTE 08/2021. Will continue with serial monitoring  #CKD III: -Follow-up with nephology as scheduled       Medication Adjustments/Labs and Tests Ordered: Current medicines are reviewed at length with the patient today.  Concerns regarding medicines are outlined above.  No orders of the defined types were placed in this encounter.  No orders of the defined types were placed in this encounter.   There are no Patient Instructions on  file for this visit.    Signed, Erik Sprague, MD  10/08/2022 7:33 PM    [facility]

## 2022-10-10 ENCOUNTER — Ambulatory Visit: Payer: PPO | Attending: Cardiology | Admitting: Cardiology

## 2022-10-11 ENCOUNTER — Encounter: Payer: Self-pay | Admitting: Cardiology

## 2022-10-17 ENCOUNTER — Telehealth: Payer: Self-pay

## 2022-10-17 NOTE — Telephone Encounter (Signed)
**Note De-Identified Marguarite Markov Obfuscation** Farxiga 10 mg RX form received from [facility]. I have completed the form for #90 with 3 refills, Dr Shari Prows signed and dated it, and I have faxed it to [facility]. I did receive confirmation that the fax sent successfully.

## 2022-10-23 ENCOUNTER — Ambulatory Visit (INDEPENDENT_AMBULATORY_CARE_PROVIDER_SITE_OTHER): Payer: PPO

## 2022-10-23 ENCOUNTER — Encounter: Payer: Self-pay | Admitting: Nurse Practitioner

## 2022-10-23 ENCOUNTER — Ambulatory Visit: Payer: PPO | Attending: Cardiology | Admitting: Nurse Practitioner

## 2022-10-23 ENCOUNTER — Ambulatory Visit (INDEPENDENT_AMBULATORY_CARE_PROVIDER_SITE_OTHER): Payer: PPO | Admitting: Podiatry

## 2022-10-23 VITALS — BP 110/70 | HR 59 | Ht 71.0 in | Wt 190.6 lb

## 2022-10-23 DIAGNOSIS — I428 Other cardiomyopathies: Secondary | ICD-10-CM

## 2022-10-23 DIAGNOSIS — L02619 Cutaneous abscess of unspecified foot: Secondary | ICD-10-CM

## 2022-10-23 DIAGNOSIS — I10 Essential (primary) hypertension: Secondary | ICD-10-CM

## 2022-10-23 DIAGNOSIS — B351 Tinea unguium: Secondary | ICD-10-CM | POA: Diagnosis not present

## 2022-10-23 DIAGNOSIS — M79674 Pain in right toe(s): Secondary | ICD-10-CM | POA: Diagnosis not present

## 2022-10-23 DIAGNOSIS — R600 Localized edema: Secondary | ICD-10-CM

## 2022-10-23 DIAGNOSIS — M722 Plantar fascial fibromatosis: Secondary | ICD-10-CM | POA: Diagnosis not present

## 2022-10-23 DIAGNOSIS — I2510 Atherosclerotic heart disease of native coronary artery without angina pectoris: Secondary | ICD-10-CM

## 2022-10-23 DIAGNOSIS — N401 Enlarged prostate with lower urinary tract symptoms: Secondary | ICD-10-CM | POA: Diagnosis not present

## 2022-10-23 DIAGNOSIS — I480 Paroxysmal atrial fibrillation: Secondary | ICD-10-CM | POA: Diagnosis not present

## 2022-10-23 DIAGNOSIS — R351 Nocturia: Secondary | ICD-10-CM | POA: Diagnosis not present

## 2022-10-23 DIAGNOSIS — E785 Hyperlipidemia, unspecified: Secondary | ICD-10-CM

## 2022-10-23 DIAGNOSIS — N183 Chronic kidney disease, stage 3 unspecified: Secondary | ICD-10-CM

## 2022-10-23 DIAGNOSIS — I5042 Chronic combined systolic (congestive) and diastolic (congestive) heart failure: Secondary | ICD-10-CM

## 2022-10-23 DIAGNOSIS — M79675 Pain in left toe(s): Secondary | ICD-10-CM | POA: Diagnosis not present

## 2022-10-23 DIAGNOSIS — L03119 Cellulitis of unspecified part of limb: Secondary | ICD-10-CM

## 2022-10-23 DIAGNOSIS — D6869 Other thrombophilia: Secondary | ICD-10-CM

## 2022-10-23 MED ORDER — DOXYCYCLINE HYCLATE 100 MG PO TABS: 100.0000 mg | ORAL_TABLET | Freq: Two times a day (BID) | ORAL | 1 refills | Status: DC

## 2022-10-23 NOTE — Progress Notes (Signed)
Cardiology Office Note:    Date:  10/23/2022   ID:  Erik, [name] 11-30-1940, MRN [id number]  PCP:  Gaspar Garbe, MD   [facility] Providers Cardiologist:  Meriam Sprague, MD     Referring MD: Gaspar Garbe, MD   Chief Complaint: leg edema  History of Present Illness:    Erik [name] is a pleasant 82 y.o. male with a hx of nonobstructive CAD, PAF on chronic anticoagulation, PVCs, NICM (EF 35%-40% 2009 then improved to 55%, EF 45-50% 2023), CKD, type 2 diabetes, HLD, and HTN. In 2015 during admission for a fib with RVR had decline in EF.  Nonobstructive CAD by cath 2004.  Nuclear stress test 09/2013 showed no ischemia, fixed defect suggestive of possible prior infarction, felt low risk, EF 48%.  CKD is followed by Dr. [name] at [facility].   Previously followed by Dr. Delton See he is now followed by Dr. Shari Prows.  Most recent TTE 08/2021 showed LVEF 45 to 50% with global hypokinesis, normal RV function, mild pulmonary hypertension, mild MR.  Last cardiology clinic visit was 04/23/2022 with Jari Favre, PA. He had been seen in the office on 11/15 noted to have bilateral lower extremity edema right greater than left for 2 to 3 months.  Reported his nephrologist started him on Lasix 40 mg daily less than 2 weeks prior and symptoms were slowly improving.  He reported his weight was down 4 pounds from previous visit. Trying to get Farxiga at lower cost.  Volume status was stable and no additional changes were made to treatment plan.  He was advised to return in 3 months for follow-up.  Today, he is here alone for follow-up. He walked into clinic on the wrong day that appointment was scheduled and was subsequently added to my schedule. Ambulating with a walker due to back pain. Suffered a mechanical fall approximately 3 weeks ago, missed a step. Hit his head and hurt his arm but says he fell pretty gently. Now having continuous back and hip pain,  awaiting tests with PCP. He did not go to urgent care when he struck his head, he said it was not a hard hit. No residual headaches, dizziness, confusion. Reports leg swelling "comes and goes." Dr. Shari Prows gave him some lotion that he used for a while but ran out of it, it helped. Sometimes feels like swelling improves when not wearing compression, but he continues to wear them periodically.  He denies chest pain, shortness of breath, dyspnea, orthopnea, PND, presyncope, syncope. No specific cardiac concerns today.   Past Medical History:  Diagnosis Date   Chronic combined systolic and diastolic CHF (congestive heart failure) (HCC)    CKD (chronic kidney disease), stage III (HCC)    Coronary artery disease    a. Nonobstructive by cath 2004. b. Nuc 09/2013 - no ischemia, fixed defect suggestive of possible prior infarction, felt low risk, EF 48%.    Diabetes mellitus    GERD (gastroesophageal reflux disease)    Hyperlipidemia    Hypertension    NICM (nonischemic cardiomyopathy) (HCC)    a. EF 35-40% in 2009, 55% in 2010. b. 09/2013: 35-40% by echo and 48% by nuc. c. EF 50-55% by echo 12/2014.   PAD (peripheral artery disease) (HCC)    a. mild by noninvasive testing.   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    Premature ventricular contractions    Thrombocytopenia (HCC)  Trigeminal neuralgia     Past Surgical History:  Procedure Laterality Date   CARDIAC CATHETERIZATION  10/25/00, 12/29/02   CHOLECYSTECTOMY N/A 01/25/2015   Procedure: LAPAROSCOPIC CHOLECYSTECTOMY ;  Surgeon: Abigail Miyamoto, MD;  Location: [facility] OR;  Service: General;  Laterality: N/A;   SHOULDER SURGERY Left 2011   Shoulder surgery, rod inserted    Current Medications: Current Meds  Medication Sig   acetaminophen (TYLENOL) 325 MG tablet Take 2 tablets (650 mg total) by mouth every 6 (six) hours as needed for mild pain (or Fever >/= 101).   atorvastatin (LIPITOR) 40 MG tablet  Take 1 tablet by mouth daily.   BD PEN NEEDLE NANO U/F 32G X 4 MM MISC USE 1 NEEDLE WITH INSULIN INJECTION ONCE DAILY   Blood Glucose Calibration (TRUE METRIX LEVEL 1) Low SOLN USE TO CALIBRATE GLUCOMETER ONCE MONTHY OR WITH EACH NEW VIAL OF STRIPS, WHICHEVER COMES FIRST   Blood Glucose Monitoring Suppl (TRUE METRIX AIR GLUCOSE METER) w/Device KIT use to check blood sugar three times daily prior to meals. E11.29   Coenzyme Q10 (COQ10) 200 MG CAPS Take 100 mg by mouth daily.    Continuous Blood Gluc Receiver (FREESTYLE LIBRE 2 READER) DEVI See admin instructions.   Continuous Blood Gluc Sensor (FREESTYLE LIBRE 2 SENSOR) MISC Inject into the skin every 14 (fourteen) days.   dapagliflozin propanediol (FARXIGA) 10 MG TABS tablet Take 1 tablet (10 mg total) by mouth daily before breakfast.   finasteride (PROSCAR) 5 MG tablet Take 5 mg by mouth once daily   furosemide (LASIX) 40 MG tablet Take 1 tablet (40 mg total) by mouth daily   gabapentin (NEURONTIN) 300 MG capsule Take 1 capsule (300 mg total) by mouth 3 (three) times daily.   glucose blood (TRUE METRIX BLOOD GLUCOSE TEST) test strip use to check blood sugar three times daily prior to meals. E11.29   ipratropium (ATROVENT) 0.03 % nasal spray    metoprolol succinate (TOPROL XL) 50 MG 24 hr tablet Take 1.5 tablets (75 mg total) by mouth daily. Take with or immediately following a meal.   Multiple Vitamin (MULTIVITAMIN) capsule Take 1 capsule by mouth daily.   OXcarbazepine (TRILEPTAL) 150 MG tablet Take 1 tablet (150 mg total) by mouth 2 (two) times daily.   pantoprazole (PROTONIX) 20 MG tablet Take 20 mg by mouth daily.   PROLENSA 0.07 % SOLN    Rivaroxaban (XARELTO) 15 MG TABS tablet TAKE 1 TABLET(15 MG) BY MOUTH DAILY WITH SUPPER   selenium 200 MCG TABS tablet Take 200 mcg by mouth daily.   tamsulosin (FLOMAX) 0.4 MG CAPS capsule Take 0.4 mg by mouth at bedtime.   TOUJEO SOLOSTAR 300 UNIT/ML SOPN Inject 36 Units as directed daily.    Trospium  Chloride 60 MG CP24    TRUEplus Lancets 33G MISC use to check blood sugar three times daily prior to meals. E11.29     Allergies:   Claritin [loratadine], Tradjenta [linagliptin], and Imipramine   Social History   Socioeconomic History   Marital status: Married    Spouse name: Not on file   Number of children: Not on file   Years of education: Not on file   Highest education level: Not on file  Occupational History   Occupation: retired    Comment: from maintenance  Tobacco Use   Smoking status: Former    Packs/day: 0.50    Years: 10.00    Additional pack years: 0.00    Total pack years: 5.00  Types: Cigarettes    Quit date: 01/21/1985    Years since quitting: 37.7   Smokeless tobacco: Never   Tobacco comments:    quit 35 yrs ago  Vaping Use   Vaping Use: Never used  Substance and Sexual Activity   Alcohol use: No   Drug use: No   Sexual activity: Not Currently    Birth control/protection: None  Other Topics Concern   Not on file  Social History Narrative   Lives with wife in a one story home.  Has 3 children.  Retired from IT trainer at [employer].     Education: high school.      Right Handed    Lives in a one story    Social Determinants of Health   Financial Resource Strain: Not on file  Food Insecurity: No Food Insecurity (02/25/2020)   Hunger Vital Sign    Worried About Running Out of Food in the Last Year: Never true    Ran Out of Food in the Last Year: Never true  Transportation Needs: Not on file  Physical Activity: Not on file  Stress: Not on file  Social Connections: Not on file     Family History: The patient's family history includes Diabetes in his brother and mother; Heart disease in his brother, father, and mother; Hypertension in his brother and mother. There is no history of Heart attack or Stroke.  ROS:   Please see the history of present illness.    + bilateral LE edema All other systems reviewed and are  negative.  Labs/Other Studies Reviewed:    The following studies were reviewed today:  Echo 09/19/21 1. Left ventricular ejection fraction, by estimation, is 45 to 50%. The  left ventricle has mildly decreased function. The left ventricle  demonstrates global hypokinesis. Left ventricular diastolic parameters are  consistent with Grade I diastolic  dysfunction (impaired relaxation). The average left ventricular global  longitudinal strain is -17.9 %.   2. Right ventricular systolic function is normal. The right ventricular  size is mildly enlarged. There is mildly elevated pulmonary artery  systolic pressure.   3. Left atrial size was mildly dilated.   4. The mitral valve is grossly normal. Mild mitral valve regurgitation.   5. The aortic valve is tricuspid. There is mild calcification of the  aortic valve. There is mild thickening of the aortic valve. Aortic valve  regurgitation is trivial. Aortic valve sclerosis/calcification is present,  without any evidence of aortic  stenosis.   Comparison(s): TTE 01/23/15 EF 50-55%, mild MR.  Nuclear Stress Test 09/24/13 IMPRESSION:  1. Primarily fixed, medium-sized, mild mid to apical anteroseptal  perfusion defect. There was a wall motion abnormality in the same  region. This is suggestive of possible prior infarction with no  significant ischemia.   2.  EF mildly decreased at 48% with apical septal hypokinesis.   3.  Overall low risk study with no significant ischemia noted.  Recent Labs: 04/10/2022: BUN 25; Creatinine, Ser 1.74; NT-Pro BNP 3,139; Potassium 4.2; Sodium 142  Recent Lipid Panel    Component Value Date/Time   CHOL 115 09/03/2021 0839   TRIG 95 09/03/2021 0839   HDL 42 09/03/2021 0839   CHOLHDL 2.7 09/03/2021 0839   LDLCALC 55 09/03/2021 0839     Risk Assessment/Calculations:    CHA2DS2-VASc Score = 5   This indicates a 7.2% annual risk of stroke. The patient's score is based upon: CHF History: 1 HTN History:  1 Diabetes History: 1  Stroke History: 0 Vascular Disease History: 0 Age Score: 2 Gender Score: 0          Physical Exam:    VS:  BP 110/70   Pulse (!) 59   Ht 5\' 11"  (1.803 m)   Wt 190 lb 9.6 oz (86.5 kg)   SpO2 93%   BMI 26.58 kg/m     Wt Readings from Last 3 Encounters:  10/23/22 190 lb 9.6 oz (86.5 kg)  04/23/22 191 lb (86.6 kg)  04/10/22 184 lb 3.2 oz (83.6 kg)     GEN:  Well nourished, well developed in no acute distress HEENT: Normal NECK: No JVD; No carotid bruits CARDIAC: RRR, no murmurs, rubs, gallops RESPIRATORY:  Clear to auscultation without rales, wheezing or rhonchi  ABDOMEN: Soft, non-tender, non-distended MUSCULOSKELETAL:  Bilateral nonpitting LE edema; No deformity. 2+ pedal pulses, equal bilaterally SKIN: Warm and dry NEUROLOGIC:  Alert and oriented x 3 PSYCHIATRIC:  Normal affect   EKG:  EKG is not ordered today.         Diagnoses:    1. NICM (nonischemic cardiomyopathy) (HCC)   2. Paroxysmal atrial fibrillation (HCC)   3. Bilateral leg edema   4. Secondary hypercoagulable state (HCC)   5. Essential hypertension   6. Stage 3 chronic kidney disease, unspecified whether stage 3a or 3b CKD (HCC)   7. Coronary artery disease involving native coronary artery of native heart without angina pectoris   8. Chronic combined systolic and diastolic CHF (congestive heart failure) (HCC)   9. Hyperlipidemia LDL goal <70    Assessment and Plan:     NICM/chronic combined CHF: LVEF 45 to 50%, G1 DD, mildly enlarged RV with normal function on echo 09/19/21. Continues to have bilateral LE edema that is non-pitting, occasionally improves with elevation and compression. Does not feel it has worsened recently. No dyspnea, orthopnea, PND. Weight has been stable.  Overall appears euvolemic on exam.  We will continue GDMT for CHF including dapagliflozin, metoprolol, furosemide. No financial constraints or concerns with medications. Has appointment with nephrology  today, therefore will not get BMP while in clinic.   Bilateral leg edema: He has moderate, nonpitting bilateral leg edema. He reports intermittent episodes of increased or decreased edema with elevation and compression.  Likely exacerbated by recent immobility from back pain.  No additional symptoms of CHF that are concerning. Encouraged low-sodium diet. Continue leg elevation and compression.  Continue Lasix 40 mg daily. Is seeing nephrology today, therefore will not check BMP today.   PAF on chronic anticoagulation: HR is well-controlled. He denies palpitations, tachycardia, or other concerning symptoms associated with a fib. No bleeding concerns. Continue Xarelto 15 mg daily for stroke prevention for CHA2DS2-VASc score of 6. Creatinine clearance calculated at 36 mL/min, therefore Xarelto is appropriate dose. Continue metoprolol for rate control.  Hyperlipidemia LDL goal < 70: LDL 59 on 05/29/22, well controlled. Continue atorvastatin.  CAD without angina: Nonobstructive CAD on cath 2004. He denies chest pain, dyspnea, or other symptoms concerning for angina. No indication for further ischemic evaluation at this time. Physical activity currently limited by back pain. Continue secondary prevention with atorvastatin, heart healthy diet, and physical activity as tolerated.   Hypertension: BP is well controlled. No medication changes today.   CKD: Followed by Dr. Arrie Aran, nephrology. Seeing nephrologist today.      Disposition: 6 months   Medication Adjustments/Labs and Tests Ordered: Current medicines are reviewed at length with the patient today.  Concerns regarding medicines are outlined above.  No orders of the defined types were placed in this encounter.  No orders of the defined types were placed in this encounter.   Patient Instructions  Medication Instructions:   Your physician recommends that you continue on your current medications as directed. Please refer to the Current  Medication list given to you today.   *If you need a refill on your cardiac medications before your next appointment, please call your pharmacy*   Lab Work:  None ordered.  If you have labs (blood work) drawn today and your tests are completely normal, you will receive your results only by: MyChart Message (if you have MyChart) OR A paper copy in the mail If you have any lab test that is abnormal or we need to change your treatment, we will call you to review the results.   Testing/Procedures:  None ordered.   Follow-Up: At [facility], you and your health needs are our priority.  As part of our continuing mission to provide you with exceptional heart care, we have created designated Provider Care Teams.  These Care Teams include your primary Cardiologist (physician) and Advanced Practice Providers (APPs -  Physician Assistants and Nurse Practitioners) who all work together to provide you with the care you need, when you need it.  We recommend signing up for the patient portal called "MyChart".  Sign up information is provided on this After Visit Summary.  MyChart is used to connect with patients for Virtual Visits (Telemedicine).  Patients are able to view lab/test results, encounter notes, upcoming appointments, etc.  Non-urgent messages can be sent to your provider as well.   To learn more about what you can do with MyChart, go to [email or web address].    Your next appointment:   6 month(s)  Provider:   Eligha Bridegroom, NP         Other Instructions  Your physician wants you to follow-up in: 6 months with Lebron Conners. You will receive a reminder letter in the mail two months in advance. If you don't receive a letter, please call our office to schedule the follow-up appointment.     Signed, Levi Aland, NP  10/23/2022 12:56 PM    [facility]

## 2022-10-23 NOTE — Progress Notes (Signed)
Subjective:   Patient ID: Erik [name], male   DOB: 82 y.o.   MRN: [id number]   HPI Patient presents not good historian who has irritation in both heels that occurred what appears to be recently and apparently over the last month or 2.  Also has elongated nailbeds 1-5 both feet that are thick and he cannot cut himself   ROS      Objective:  Physical Exam  Neurovascular status unchanged long-term diabetes with areas of yellow discoloration on the plantar heel bilateral with discomfort and what appears to be fluid accumulation bilateral plantar heels with no history of injury and elongated nailbeds 1-5 both feet that are thick and can become painful     Assessment:  Appears to be some form of abscess formation plantar heel region bilateral localized which may have been due to heat exposure or other condition that he is not aware of along with mycotic nail infection 1-5 both feet     Plan:  Reviewed both conditions sterile debridement and opening of abscess plantar aspect heel bilateral with clear drainage that was expressed from the area and seem to relieve the pain that he is experiencing.  I did discuss soaks and padding for this and I am placing him on antibiotic and I also went ahead and I debrided nailbeds 1-5 both feet nitrogen and bleeding.  X-rays were negative for signs of osteolysis or bony infection associated with the condition gave him strict instructions that if any redness pain swelling or other pathology or systemic pathology were to occur he is to go to the emergency room and let us know

## 2022-10-23 NOTE — Patient Instructions (Signed)
Medication Instructions:   Your physician recommends that you continue on your current medications as directed. Please refer to the Current Medication list given to you today.   *If you need a refill on your cardiac medications before your next appointment, please call your pharmacy*   Lab Work:  None ordered.  If you have labs (blood work) drawn today and your tests are completely normal, you will receive your results only by: MyChart Message (if you have MyChart) OR A paper copy in the mail If you have any lab test that is abnormal or we need to change your treatment, we will call you to review the results.   Testing/Procedures:  None ordered.   Follow-Up: At [facility], you and your health needs are our priority.  As part of our continuing mission to provide you with exceptional heart care, we have created designated Provider Care Teams.  These Care Teams include your primary Cardiologist (physician) and Advanced Practice Providers (APPs -  Physician Assistants and Nurse Practitioners) who all work together to provide you with the care you need, when you need it.  We recommend signing up for the patient portal called "MyChart".  Sign up information is provided on this After Visit Summary.  MyChart is used to connect with patients for Virtual Visits (Telemedicine).  Patients are able to view lab/test results, encounter notes, upcoming appointments, etc.  Non-urgent messages can be sent to your provider as well.   To learn more about what you can do with MyChart, go to [email or web address].    Your next appointment:   6 month(s)  Provider:   Michelle Swinyer, NP         Other Instructions  Your physician wants you to follow-up in: 6 months with Michelle Swinyer,NP You will receive a reminder letter in the mail two months in advance. If you don't receive a letter, please call our office to schedule the follow-up appointment.   

## 2022-10-24 ENCOUNTER — Ambulatory Visit: Payer: PPO | Admitting: Physician Assistant

## 2022-10-24 ENCOUNTER — Ambulatory Visit: Payer: PPO | Admitting: Podiatry

## 2022-10-24 DIAGNOSIS — I129 Hypertensive chronic kidney disease with stage 1 through stage 4 chronic kidney disease, or unspecified chronic kidney disease: Secondary | ICD-10-CM | POA: Diagnosis not present

## 2022-10-24 DIAGNOSIS — N2581 Secondary hyperparathyroidism of renal origin: Secondary | ICD-10-CM | POA: Diagnosis not present

## 2022-10-24 DIAGNOSIS — R809 Proteinuria, unspecified: Secondary | ICD-10-CM | POA: Diagnosis not present

## 2022-10-24 DIAGNOSIS — G500 Trigeminal neuralgia: Secondary | ICD-10-CM | POA: Diagnosis not present

## 2022-10-24 DIAGNOSIS — N183 Chronic kidney disease, stage 3 unspecified: Secondary | ICD-10-CM | POA: Diagnosis not present

## 2022-10-24 DIAGNOSIS — E1122 Type 2 diabetes mellitus with diabetic chronic kidney disease: Secondary | ICD-10-CM | POA: Diagnosis not present

## 2022-10-24 DIAGNOSIS — I428 Other cardiomyopathies: Secondary | ICD-10-CM | POA: Diagnosis not present

## 2022-10-24 DIAGNOSIS — N179 Acute kidney failure, unspecified: Secondary | ICD-10-CM | POA: Diagnosis not present

## 2022-10-24 DIAGNOSIS — D631 Anemia in chronic kidney disease: Secondary | ICD-10-CM | POA: Diagnosis not present

## 2022-10-24 DIAGNOSIS — N189 Chronic kidney disease, unspecified: Secondary | ICD-10-CM | POA: Diagnosis not present

## 2022-10-24 DIAGNOSIS — Q631 Lobulated, fused and horseshoe kidney: Secondary | ICD-10-CM | POA: Diagnosis not present

## 2022-10-24 DIAGNOSIS — I5189 Other ill-defined heart diseases: Secondary | ICD-10-CM | POA: Diagnosis not present

## 2022-10-25 LAB — LAB REPORT - SCANNED
Creatinine, POC: 137.5 mg/dL
EGFR: 39

## 2022-11-12 ENCOUNTER — Ambulatory Visit (INDEPENDENT_AMBULATORY_CARE_PROVIDER_SITE_OTHER): Payer: PPO

## 2022-11-12 ENCOUNTER — Ambulatory Visit: Payer: PPO | Admitting: Podiatry

## 2022-11-12 ENCOUNTER — Other Ambulatory Visit: Payer: Self-pay | Admitting: Podiatry

## 2022-11-12 DIAGNOSIS — L97412 Non-pressure chronic ulcer of right heel and midfoot with fat layer exposed: Secondary | ICD-10-CM

## 2022-11-12 DIAGNOSIS — B999 Unspecified infectious disease: Secondary | ICD-10-CM | POA: Diagnosis not present

## 2022-11-12 MED ORDER — SILVER SULFADIAZINE 1 % EX CREA: 1.0000 | TOPICAL_CREAM | Freq: Every day | CUTANEOUS | 0 refills | Status: DC

## 2022-11-12 MED ORDER — AMOXICILLIN-POT CLAVULANATE 875-125 MG PO TABS: 1.0000 | ORAL_TABLET | Freq: Two times a day (BID) | ORAL | 0 refills | Status: DC

## 2022-11-12 NOTE — Progress Notes (Signed)
Subjective: Chief Complaint  Patient presents with   Heel Spurs    Pt states his heel hurts a lot when he walks and has puss coming out on his shin, pt states that to help his pain he elevates his feet   82 year old male with above concerns.  He states he has a wound on the bottom of his heel he also has a wound on the leg.  Previously had some pus coming out of this area.  He does not report any fevers or chills.  She also started a wound on the bottom of the left heel as well.  He states that both heels hurt when he is walking right side is worse than left.   Objective: AAO x3, NAD DP/PT pulses decreased bilaterally, CRT less than 3 seconds Sensation decreased.  Ulceration present to the plantar aspect of the right heel.  This is a full-thickness ulceration with there is no probing to bone although it is close to the calcaneus plantarly.  There is localized edema and mild rim of erythema without any fluctuation, crepitation, malodor.  Just proximal to ankle superficial area skin breakdown, able to elicit any purulence today and there is no fluctuation, crepitation in this area. Preulcerative lesion noted on the plantar aspect the left heel.  No pain with calf compression, swelling, warmth, erythema  Assessment: Ulceration right side, concern for infection  Plan: -All treatment options discussed with the patient including all alternatives, risks, complications.  -Repeat x-rays were obtained and reviewed.  Continue aspirin as present.  There is no definitive cortical changes to suggest osteomyelitis at this time.  There is no soft tissue edema. -Cam boot dispensed for the right side for immobilization to help facilitate soft tissue healing. -Start Augmentin -I ordered blood work including CBC, CRP, sed rate, BMP. -ABI -MRI -Monitor for any clinical signs or symptoms of infection and directed to call the office immediately should any occur or go to the ER. -Patient encouraged to call the  office with any questions, concerns, change in symptoms.   Vivi Barrack DPM

## 2022-11-12 NOTE — Patient Instructions (Signed)
Apply a small amount of silvadene to the ulcers daily Start Augmentin Wear boot and stay off feet as much as possible  I have ordered a circulation test and a MRI  Get blood work done  Monitor for any signs/symptoms of infection. Call the office immediately if any occur or go directly to the emergency room. Call with any questions/concerns.

## 2022-11-13 LAB — C-REACTIVE PROTEIN: CRP: 14 mg/L — ABNORMAL HIGH (ref 0–10)

## 2022-11-13 LAB — CBC WITH DIFFERENTIAL/PLATELET
Basophils Absolute: 0.1 10*3/uL (ref 0.0–0.2)
Basos: 1 %
EOS (ABSOLUTE): 0.1 10*3/uL (ref 0.0–0.4)
Eos: 3 %
Hematocrit: 35.2 % — ABNORMAL LOW (ref 37.5–51.0)
Hemoglobin: 10.8 g/dL — ABNORMAL LOW (ref 13.0–17.7)
Immature Grans (Abs): 0 10*3/uL (ref 0.0–0.1)
Immature Granulocytes: 0 %
Lymphocytes Absolute: 0.8 10*3/uL (ref 0.7–3.1)
Lymphs: 20 %
MCH: 25.6 pg — ABNORMAL LOW (ref 26.6–33.0)
MCHC: 30.7 g/dL — ABNORMAL LOW (ref 31.5–35.7)
MCV: 83 fL (ref 79–97)
Monocytes Absolute: 0.4 10*3/uL (ref 0.1–0.9)
Monocytes: 9 %
Neutrophils Absolute: 2.6 10*3/uL (ref 1.4–7.0)
Neutrophils: 67 %
Platelets: 137 10*3/uL — ABNORMAL LOW (ref 150–450)
RBC: 4.22 x10E6/uL (ref 4.14–5.80)
RDW: 15.4 % (ref 11.6–15.4)
WBC: 3.9 10*3/uL (ref 3.4–10.8)

## 2022-11-13 LAB — BASIC METABOLIC PANEL
BUN/Creatinine Ratio: 7 — ABNORMAL LOW (ref 10–24)
BUN: 12 mg/dL (ref 8–27)
CO2: 27 mmol/L (ref 20–29)
Calcium: 8.4 mg/dL — ABNORMAL LOW (ref 8.6–10.2)
Chloride: 107 mmol/L — ABNORMAL HIGH (ref 96–106)
Creatinine, Ser: 1.72 mg/dL — ABNORMAL HIGH (ref 0.76–1.27)
Glucose: 178 mg/dL — ABNORMAL HIGH (ref 70–99)
Potassium: 4 mmol/L (ref 3.5–5.2)
Sodium: 146 mmol/L — ABNORMAL HIGH (ref 134–144)
eGFR: 39 mL/min/{1.73_m2} — ABNORMAL LOW (ref >59)

## 2022-11-13 LAB — SEDIMENTATION RATE: Sed Rate: 2 mm/hr (ref 0–30)

## 2022-11-14 ENCOUNTER — Ambulatory Visit (HOSPITAL_COMMUNITY)
Admission: RE | Admit: 2022-11-14 | Discharge: 2022-11-14 | Disposition: A | Discharge status: Home / Self Care | Payer: PPO | Source: Ambulatory Visit | Attending: Podiatry | Admitting: Podiatry

## 2022-11-14 DIAGNOSIS — L97412 Non-pressure chronic ulcer of right heel and midfoot with fat layer exposed: Secondary | ICD-10-CM

## 2022-11-15 LAB — VAS US PAD ABI
Left ABI: 0.99
Right ABI: 1.08

## 2022-12-07 ENCOUNTER — Encounter (HOSPITAL_COMMUNITY): Payer: Self-pay

## 2022-12-07 ENCOUNTER — Ambulatory Visit (HOSPITAL_COMMUNITY)
Admission: EM | Admit: 2022-12-07 | Discharge: 2022-12-07 | Disposition: A | Discharge status: Home / Self Care | Payer: PPO | Attending: Physician Assistant | Admitting: Physician Assistant

## 2022-12-07 ENCOUNTER — Ambulatory Visit (INDEPENDENT_AMBULATORY_CARE_PROVIDER_SITE_OTHER): Payer: PPO

## 2022-12-07 DIAGNOSIS — M545 Low back pain, unspecified: Secondary | ICD-10-CM

## 2022-12-07 DIAGNOSIS — M549 Dorsalgia, unspecified: Secondary | ICD-10-CM | POA: Diagnosis not present

## 2022-12-07 DIAGNOSIS — M5136 Other intervertebral disc degeneration, lumbar region: Secondary | ICD-10-CM | POA: Diagnosis not present

## 2022-12-07 DIAGNOSIS — M25559 Pain in unspecified hip: Secondary | ICD-10-CM | POA: Diagnosis not present

## 2022-12-07 DIAGNOSIS — W19XXXA Unspecified fall, initial encounter: Secondary | ICD-10-CM | POA: Diagnosis not present

## 2022-12-07 DIAGNOSIS — M160 Bilateral primary osteoarthritis of hip: Secondary | ICD-10-CM | POA: Diagnosis not present

## 2022-12-07 DIAGNOSIS — S300XXA Contusion of lower back and pelvis, initial encounter: Secondary | ICD-10-CM | POA: Diagnosis not present

## 2022-12-07 MED ORDER — LIDOCAINE 5 % EX PTCH: 1.0000 | MEDICATED_PATCH | CUTANEOUS | 0 refills | Status: DC

## 2022-12-07 MED ORDER — BACLOFEN 5 MG PO TABS: 5.0000 mg | ORAL_TABLET | Freq: Every evening | ORAL | 0 refills | Status: DC | PRN

## 2022-12-07 NOTE — ED Triage Notes (Signed)
Pt is here for lower back pain and a fall. Pt reports he miss a step.

## 2022-12-07 NOTE — ED Provider Notes (Signed)
[facility]    CSN: [id number] Arrival date & time: 12/07/22  1428      History   Chief Complaint Chief Complaint  Patient presents with   Fall   Back Pain    HPI Erik [name] is a 82 y.o. male.   Patient presents today for lower back pain following recent trauma.  Reports that approximately 5 days ago he was at his granddaughter's new house when he took a misstep and fell onto the floor.  He believes that he bumped his head but denies any significant impact and had no loss of consciousness.  Since then he has had no headache, dizziness, nausea, vomiting, amnesia surrounding event.  He is chronically anticoagulated with Xarelto due to A-fib.  His primary concern today is severe lower back pain.  He reports that initially this was mildly painful but after few days became significantly more painful.  During episodes pain is rated 10 on a 0-10 pain scale, described as aching with periodic sharp pains, worse with changing position, no alleviating factors identified.  He has not tried any over-the-counter medication for symptom management.  Denies any bowel/bladder incontinence, lower extremity weakness, saddle anesthesia.  Denies previous injury or surgery involving his back.    Past Medical History:  Diagnosis Date   Chronic combined systolic and diastolic CHF (congestive heart failure) (HCC)    CKD (chronic kidney disease), stage III (HCC)    Coronary artery disease    a. Nonobstructive by cath 2004. b. Nuc 09/2013 - no ischemia, fixed defect suggestive of possible prior infarction, felt low risk, EF 48%.    Diabetes mellitus    GERD (gastroesophageal reflux disease)    Hyperlipidemia    Hypertension    NICM (nonischemic cardiomyopathy) (HCC)    a. EF 35-40% in 2009, 55% in 2010. b. 09/2013: 35-40% by echo and 48% by nuc. c. EF 50-55% by echo 12/2014.   PAD (peripheral artery disease) (HCC)    a. mild by noninvasive testing.   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    Premature ventricular contractions    Thrombocytopenia (HCC)    Trigeminal neuralgia     Patient Active Problem List   Diagnosis Date Noted   Annual physical exam 08/16/2020   PAD (peripheral artery disease) (HCC) 11/02/2019   Visual field defect 08/12/2019   Pain due to onychomycosis of toenail of right foot 07/02/2019   Diabetic neuropathy (HCC) 03/31/2019   Plantar flexed metatarsal bone of left foot 03/31/2019   Plantar flexed metatarsal bone of right foot 03/31/2019   Porokeratosis 03/31/2019   Thrombophilia (HCC) 02/09/2019   Thrombocytopenia (HCC) 08/03/2018   Hyperlipidemia    Benign neoplasm of colon 04/07/2015   Systolic CHF (HCC) 02/28/2015   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   Non-thrombocytopenic purpura (HCC) 02/23/2015   Generalized anxiety disorder 02/09/2015   Localized edema 02/09/2015   Acute biliary pancreatitis    Acute on chronic renal failure (HCC)    Pancreatitis 01/22/2015   Acute pancreatitis 01/22/2015   Abdominal pain    Gallstones    Acute gallstone pancreatitis    Actinic keratosis 07/13/2014   Low back pain 01/11/2014   Long term (current) use of anticoagulants 09/29/2013   CKD (chronic kidney disease), stage III (HCC)    Atrial fibrillation (HCC)    Coronary artery disease    NICM (nonischemic cardiomyopathy) (HCC)    Hypertension    GERD (  gastroesophageal reflux disease)    Premature ventricular contractions    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    Chest pain 09/23/2013   Elevated PSA 07/05/2013   Cellulitis 12/02/2012   Diabetes mellitus (HCC) 12/02/2012   Proteinuria 07/07/2012   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   Benign prostatic hyperplasia with lower urinary tract symptoms 03/09/2009   Diabetic renal disease (HCC) 03/09/2009   Pure hypercholesterolemia 03/09/2009   HYPERLIPIDEMIA 10/11/2008    Past Surgical History:  Procedure Laterality  Date   CARDIAC CATHETERIZATION  10/25/00, 12/29/02   CHOLECYSTECTOMY N/A 01/25/2015   Procedure: LAPAROSCOPIC CHOLECYSTECTOMY ;  Surgeon: Abigail Miyamoto, MD;  Location: [facility] OR;  Service: General;  Laterality: N/A;   SHOULDER SURGERY Left 2011   Shoulder surgery, rod inserted       Home Medications    Prior to Admission medications   Medication Sig Start Date End Date Taking? Authorizing Provider  atorvastatin (LIPITOR) 40 MG tablet Take 1 tablet by mouth daily.   Yes [provider]  Baclofen 5 MG TABS Take 1 tablet (5 mg total) by mouth at bedtime as needed. 12/07/22  Yes Curran Lenderman K, PA-C  BD PEN NEEDLE NANO U/F 32G X 4 MM MISC USE 1 NEEDLE WITH INSULIN INJECTION ONCE DAILY 03/04/18  Yes [provider]  Blood Glucose Calibration (TRUE METRIX LEVEL 1) Low SOLN USE TO CALIBRATE GLUCOMETER ONCE MONTHY OR WITH EACH NEW VIAL OF STRIPS, WHICHEVER COMES FIRST 10/13/17  Yes [provider]  Blood Glucose Monitoring Suppl (TRUE METRIX AIR GLUCOSE METER) w/Device KIT use to check blood sugar three times daily prior to meals. E11.29 10/09/17  Yes [provider]  Coenzyme Q10 (COQ10) 200 MG CAPS Take 100 mg by mouth daily.    Yes [provider]  Continuous Blood Gluc Receiver (FREESTYLE LIBRE 2 READER) DEVI See admin instructions. 12/26/21  Yes [provider]  Continuous Blood Gluc Sensor (FREESTYLE LIBRE 2 SENSOR) MISC Inject into the skin every 14 (fourteen) days. 03/19/22  Yes [provider]  dapagliflozin propanediol (FARXIGA) 10 MG TABS tablet Take 1 tablet (10 mg total) by mouth daily before breakfast. 04/10/22  Yes Meriam Sprague, MD  finasteride (PROSCAR) 5 MG tablet Take 5 mg by mouth once daily 03/06/17  Yes [provider]  furosemide (LASIX) 40 MG tablet Take 1 tablet (40 mg total) by mouth daily 04/23/22  Yes Conte, Tessa N, PA-C  gabapentin (NEURONTIN) 300 MG capsule Take 1 capsule (300 mg total) by mouth 3  (three) times daily. 06/20/21  Yes Patel, Donika K, DO  glucose blood (TRUE METRIX BLOOD GLUCOSE TEST) test strip use to check blood sugar three times daily prior to meals. E11.29 10/09/17  Yes [provider]  ipratropium (ATROVENT) 0.03 % nasal spray  06/05/20  Yes [provider]  lidocaine (LIDODERM) 5 % Place 1 patch onto the skin daily. Remove & Discard patch within 12 hours or as directed by MD 12/07/22  Yes Briyan Kleven, Denny Peon K, PA-C  metoprolol succinate (TOPROL XL) 50 MG 24 hr tablet Take 1.5 tablets (75 mg total) by mouth daily. Take with or immediately following a meal. 10/04/22  Yes Meriam Sprague, MD  Multiple Vitamin (MULTIVITAMIN) capsule Take 1 capsule by mouth daily.   Yes [provider]  OXcarbazepine (TRILEPTAL) 150 MG tablet Take 1 tablet (150 mg total) by mouth 2 (two) times daily. 02/19/22  Yes Patel, Donika K, DO  pantoprazole (PROTONIX) 20 MG  tablet Take 20 mg by mouth daily. 12/25/21  Yes [provider]  potassium chloride SA (KLOR-CON M) 20 MEQ tablet Take 1 tablet (20 mEq total) by mouth daily for 5 days only.  Take in concurrence with 5 day increased lasix. 04/11/22  Yes Meriam Sprague, MD  PROLENSA 0.07 % SOLN  11/11/19  Yes [provider]  Rivaroxaban (XARELTO) 15 MG TABS tablet TAKE 1 TABLET(15 MG) BY MOUTH DAILY WITH SUPPER 06/01/21  Yes Meriam Sprague, MD  selenium 200 MCG TABS tablet Take 200 mcg by mouth daily.   Yes [provider]  tamsulosin (FLOMAX) 0.4 MG CAPS capsule Take 0.4 mg by mouth at bedtime.   Yes [provider]  TOUJEO SOLOSTAR 300 UNIT/ML SOPN Inject 36 Units as directed daily.  03/12/16  Yes [provider]  Trospium Chloride 60 MG CP24  02/02/18  Yes [provider]  TRUEplus Lancets 33G MISC use to check blood sugar three times daily prior to meals. E11.29 10/09/17  Yes [provider]  acetaminophen (TYLENOL) 325 MG tablet Take 2 tablets (650 mg total) by  mouth every 6 (six) hours as needed for mild pain (or Fever >/= 101). 01/29/15   Elgergawy, Leana Roe, MD  moxifloxacin (VIGAMOX) 0.5 % ophthalmic solution Place 1 drop into the left eye 4 (four) times daily. Patient not taking: Reported on 10/23/2022 11/11/19   [provider]  silver sulfADIAZINE (SILVADENE) 1 % cream Apply 1 Application topically daily. 11/12/22   Vivi Barrack, DPM    Family History Family History  Problem Relation Age of Onset   Heart disease Mother    Diabetes Mother    Hypertension Mother    Heart disease Father    Heart disease Brother    Diabetes Brother    Hypertension Brother    Heart attack Neg Hx    Stroke Neg Hx     Social History Social History   Tobacco Use   Smoking status: Former    Current packs/day: 0.00    Average packs/day: 0.5 packs/day for 10.0 years (5.0 ttl pk-yrs)    Types: Cigarettes    Start date: 01/22/1975    Quit date: 01/21/1985    Years since quitting: 37.9   Smokeless tobacco: Never   Tobacco comments:    quit 35 yrs ago  Vaping Use   Vaping status: Never Used  Substance Use Topics   Alcohol use: No   Drug use: No     Allergies   Claritin [loratadine], Tradjenta [linagliptin], and Imipramine   Review of Systems Review of Systems  Constitutional:  Positive for activity change. Negative for appetite change, fatigue and fever.  Eyes:  Negative for visual disturbance.  Respiratory:  Negative for cough and shortness of breath.   Cardiovascular:  Negative for chest pain.  Gastrointestinal:  Negative for abdominal pain, diarrhea, nausea and vomiting.  Musculoskeletal:  Positive for back pain and gait problem. Negative for arthralgias, joint swelling and myalgias.  Neurological:  Negative for dizziness, syncope, speech difficulty, weakness, light-headedness, numbness and headaches.     Physical Exam Triage Vital Signs ED Triage Vitals [12/07/22 1509]  Encounter Vitals Group     BP 126/82     Systolic BP  Percentile      Diastolic BP Percentile      Pulse Rate 77     Resp 18     Temp 97.7 F (36.5 C)     Temp Source Oral  SpO2 94 %     Weight      Height      Head Circumference      Peak Flow      Pain Score      Pain Loc      Pain Education      Exclude from Growth Chart    No data found.  Updated Vital Signs BP 126/82 (BP Location: Left Arm)   Pulse 77   Temp 97.7 F (36.5 C) (Oral)   Resp 18   SpO2 94%   Visual Acuity Right Eye Distance:   Left Eye Distance:   Bilateral Distance:    Right Eye Near:   Left Eye Near:    Bilateral Near:     Physical Exam Vitals reviewed.  Constitutional:      General: He is awake.     Appearance: Normal appearance. He is well-developed. He is not ill-appearing.     Comments: Very pleasant male appears stated age in no acute distress sitting comfortably in exam room  HENT:     Head: Normocephalic and atraumatic.     Nose: Nose normal.     Mouth/Throat:     Pharynx: Uvula midline. No oropharyngeal exudate or posterior oropharyngeal erythema.  Cardiovascular:     Rate and Rhythm: Normal rate. Rhythm irregularly irregular.     Heart sounds: Normal heart sounds, S1 normal and S2 normal. No murmur heard. Pulmonary:     Effort: Pulmonary effort is normal.     Breath sounds: No stridor. Examination of the right-lower field reveals rales. Examination of the left-lower field reveals rales. Rales present. No wheezing or rhonchi.  Abdominal:     General: Bowel sounds are normal.     Palpations: Abdomen is soft.     Tenderness: There is no abdominal tenderness.  Musculoskeletal:     Cervical back: Normal range of motion and neck supple. No tenderness or bony tenderness.     Thoracic back: No tenderness or bony tenderness.     Lumbar back: Spasms and tenderness present. No bony tenderness. Negative right straight leg raise test and negative left straight leg raise test.     Comments: Tenderness to palpation over lumbar spine and  bilateral iliac crest.  No deformity noted.  Strength 5/5 bilateral lower extremities.  Neurological:     Mental Status: He is alert.  Psychiatric:        Behavior: Behavior is cooperative.      UC Treatments / Results  Labs (all labs ordered are listed, but only abnormal results are displayed) Labs Reviewed - No data to display  EKG   Radiology DG Pelvis 1-2 Views  Result Date: 12/07/2022 CLINICAL DATA:  Pain after fall EXAM: PELVIS - 1-2 VIEW COMPARISON:  01/22/2015 FINDINGS: There is no evidence of pelvic fracture or diastasis. Mild-moderate osteoarthritic changes of both hips. No pelvic bone lesions are seen. IMPRESSION: Negative. Electronically Signed   By: Duanne Guess D.O.   On: 12/07/2022 16:12   DG Lumbar Spine Complete  Result Date: 12/07/2022 CLINICAL DATA:  Back pain after fall EXAM: LUMBAR SPINE - COMPLETE 4+ VIEW COMPARISON:  01/22/2015 FINDINGS: There is no evidence of lumbar spine fracture. Alignment is normal. Relatively mild disc space narrowing. Bulky bridging anterior endplate osteophytes throughout the spine. Calcific atherosclerotic disease. IMPRESSION: No acute findings. Electronically Signed   By: Duanne Guess D.O.   On: 12/07/2022 16:09    Procedures Procedures (including critical care time)  Medications Ordered in  UC Medications - No data to display  Initial Impression / Assessment and Plan / UC Course  I have reviewed the triage vital signs and the nursing notes.  Pertinent labs & imaging results that were available during my care of the patient were reviewed by me and considered in my medical decision making (see chart for details).     Patient is well-appearing, afebrile, nontoxic, nontachycardic.  We discussed that given his age and that he is chronically anticoagulated we generally recommend a head CT whenever there is concern for head injury, however, patient reports that this occurred multiple days ago and he is feeling well today with  no concerning symptoms.  He declined emergency room evaluation.  X-rays were obtained of pelvis and lumbar spine that showed degenerative changes without acute abnormality.  Low suspicion for spinal hematoma given clinical presentation.  Will start baclofen for pain relief and we discussed this can be sedating and he is not to drive or drink alcohol with taking it.  Discussed that he should take it right before he goes to bed to prevent falling.  He was also given lidocaine patches with instruction how to use it properly.  Recommended close follow-up with sports medicine for further evaluation and management particularly advanced imaging if symptoms or not improving.  He was given contact information for local provider with instruction call to schedule an appointment.  We discussed that if he has any worsening or changing symptoms including increasing pain, bruising on his back, blood in his urine, bowel/bladder incontinence, lower extremity weakness, saddle anesthesia he is to go to the emergency room immediately.  Strict return precautions were given.  Patient expressed understanding and agreement with treatment plan.  Final Clinical Impressions(s) / UC Diagnoses   Final diagnoses:  Acute bilateral low back pain without sciatica  Contusion of lower back, initial encounter  Fall, initial encounter     Discharge Instructions      Your x-rays did not show any evidence of fracture which is great news.  Take baclofen at night.  This will usually be to take it right before you go to bed.  Use heat and gentle stretch.  Use lidocaine patches on specific areas that are bothersome.  Place these for 12 hours and then remove for minimum of 12 hours.  I would like you to follow-up with sports medicine.  Call them to schedule an appointment as soon as possible.  If anything worsens and you have severe pain, numbness or tingling in your legs, difficulty walking, weakness you need to go to the emergency room as we  discussed.     ED Prescriptions     Medication Sig Dispense Auth. Provider   Baclofen 5 MG TABS Take 1 tablet (5 mg total) by mouth at bedtime as needed. 10 tablet Cecilee Rosner K, PA-C   lidocaine (LIDODERM) 5 % Place 1 patch onto the skin daily. Remove & Discard patch within 12 hours or as directed by MD 30 patch Jarae Panas K, PA-C      PDMP not reviewed this encounter.   Jeani Hawking, PA-C 12/07/22 1648

## 2022-12-07 NOTE — Discharge Instructions (Signed)
Your x-rays did not show any evidence of fracture which is great news.  Take baclofen at night.  This will usually be to take it right before you go to bed.  Use heat and gentle stretch.  Use lidocaine patches on specific areas that are bothersome.  Place these for 12 hours and then remove for minimum of 12 hours.  I would like you to follow-up with sports medicine.  Call them to schedule an appointment as soon as possible.  If anything worsens and you have severe pain, numbness or tingling in your legs, difficulty walking, weakness you need to go to the emergency room as we discussed.

## 2022-12-16 ENCOUNTER — Ambulatory Visit
Admission: RE | Admit: 2022-12-16 | Discharge: 2022-12-16 | Disposition: A | Discharge status: Home / Self Care | Payer: PPO | Source: Ambulatory Visit | Attending: Podiatry | Admitting: Podiatry

## 2022-12-16 DIAGNOSIS — L97412 Non-pressure chronic ulcer of right heel and midfoot with fat layer exposed: Secondary | ICD-10-CM

## 2022-12-16 DIAGNOSIS — L97512 Non-pressure chronic ulcer of other part of right foot with fat layer exposed: Secondary | ICD-10-CM | POA: Diagnosis not present

## 2022-12-18 ENCOUNTER — Ambulatory Visit: Payer: PPO | Admitting: Podiatry

## 2022-12-18 ENCOUNTER — Telehealth: Payer: Self-pay | Admitting: *Deleted

## 2022-12-18 VITALS — BP 135/57 | Temp 96.6°F

## 2022-12-18 DIAGNOSIS — B999 Unspecified infectious disease: Secondary | ICD-10-CM

## 2022-12-18 DIAGNOSIS — L97412 Non-pressure chronic ulcer of right heel and midfoot with fat layer exposed: Secondary | ICD-10-CM | POA: Diagnosis not present

## 2022-12-18 MED ORDER — DOXYCYCLINE HYCLATE 100 MG PO TABS: 100.0000 mg | ORAL_TABLET | Freq: Two times a day (BID) | ORAL | 0 refills | Status: DC

## 2022-12-18 NOTE — Telephone Encounter (Signed)
Wound culture sent to [facility].

## 2022-12-22 NOTE — Progress Notes (Signed)
Subjective: No chief complaint on file.  82 year old male presents with his wife for evaluation of heel ulceration right foot and to further discuss MRI results.  Does not report any purulence occasional bloody drainage.  No fever or chills.  The right heel still tender to walk on.  Objective: AAO x3, NAD DP/PT pulses palpable bilaterally, CRT less than 3 seconds On the heels hyperkeratotic tissue with central granular wound.  See procedure note below but after debridement was able to express serosanguineous, clear drainage but no Jabre purulence which was cultured today.  There is no probing to bone however there is undermining of approximately 2 cm.  The wound measures 0.6 x 0.5 x 2 cm after debridement prior to debridement is almost completely, calcium is difficult to measure.  There is no fluctuance or crepitation there is no odor. No pain with calf compression, swelling, warmth, erythema   Assessment: Right heel ulceration, abscess  Plan: -All treatment options discussed with the patient including all alternatives, risks, complications.  -I reviewed the MRI with the patient as well as his wife.  Discussed debridement, I&D and retractors in the office but he may need to undergo surgical intervention.  I cleaned the skin with alcohol and mixture of lidocaine, Marcaine plain was infiltrated in a regional block fashion around the ulceration.  1 hemostatic cleaned the skin with Betadine.  I utilized #161 with scalpel to sharply debride the hyperkeratotic tissue to reveal a central granular ulceration.  I debrided the wound and there was found to be tunneling approxi-2 cm.  There was clear, serosanguineous drainage expressed but there is no Zeddie purulence.  I took a culture of this today.  I irrigated with saline.  I debrided down to healthy, bleeding, viable tissue.  Betadine was applied followed by dressing.  Continue daily dressing changes. -Doxycycline -ABI 11/15/2022- WNL -Offloading- He has  a boot at home but has not been wearing it. -Monitor for any clinical signs or symptoms of infection and directed to call the office immediately should any occur or go to the ER. -Patient encouraged to call the office with any questions, concerns, change in symptoms.   Vivi Barrack DPM

## 2022-12-27 ENCOUNTER — Ambulatory Visit (INDEPENDENT_AMBULATORY_CARE_PROVIDER_SITE_OTHER): Payer: PPO | Admitting: Podiatry

## 2022-12-27 DIAGNOSIS — L601 Onycholysis: Secondary | ICD-10-CM

## 2022-12-27 DIAGNOSIS — L97412 Non-pressure chronic ulcer of right heel and midfoot with fat layer exposed: Secondary | ICD-10-CM

## 2022-12-27 MED ORDER — DOXYCYCLINE HYCLATE 100 MG PO TABS: 100.0000 mg | ORAL_TABLET | Freq: Two times a day (BID) | ORAL | 0 refills | Status: DC

## 2022-12-27 NOTE — Progress Notes (Unsigned)
Subjective: Chief Complaint  Patient presents with   Wound Check    Ulcer to right heel. Wife changing dressing daily and applying iodine to area.     82 year old male presents with his wife for evaluation of heel ulceration right foot.  States he is doing better and his wife tells him wound is healing.  He is not as painful as what it was previously.  Denies any significant drainage or any fevers or chills.  Some swelling he reports.   Patient also has no other concerns today which is new.  Patient is his right big toenail coming off but he is not sure why.  No drainage or pus or significant pain.  Objective: AAO x3, NAD DP/PT pulses palpable bilaterally, CRT less than 3 seconds On the heels hyperkeratotic tissue with central granular wound.  Overall the wound is more superficial.  It measures about 0.5 x 0.5 x 0.4 cm today with improving alignment noted today.  There is no drainage or pus.  There is no fluctuation or crepitation.  There is no malodor. His right hallux nail is very loose on The Proximal Nail Fold.  No Edema, Erythema. No pain with calf compression, swelling, warmth, erythema     Assessment: Right heel ulceration, abscess; onycholysis  Plan: -All treatment options discussed with the patient including all alternatives, risks, complications.  -Overall the wound seems to be doing better.  Did sharply debride the ulceration down to healthy, bleeding granular tissue.  Pre and post measurements are the same.  I sharply debrided the wound #312 with scalpel to debride any nonviable devitalized tissue.  Minimal blood loss and hemostasis due to manual compression.  Cleaned the area with saline.  Silvadene was applied followed by dressing.  I did order dressings to be delivered to his house form [facility]. I have ordered a silver alginate dressing.  -Continue offloading at all times -Will continue antibiotics- doxycyline  -ABI 11/15/2022- WNL -Patient debrided the right hallux nail.   Nails ultimately off at this time except for surgery,: 7.  Monitoring signs or symptoms of infection. -Monitor for any clinical signs or symptoms of infection and directed to call the office immediately should any occur or go to the ER.  Return in about 2 weeks (around 01/10/2023).  Vivi Barrack DPM

## 2022-12-27 NOTE — Patient Instructions (Signed)
I have ordered supplies form [facility]. When you get it, you can cut a small piece of the dressing to apply to the wound and then cover with gauze and wrap the foot.  You do not need to apply Betadine or any ointment. Let me know if you have any questions about it   Monitor for any signs/symptoms of infection. Call the office immediately if any occur or go directly to the emergency room. Call with any questions/concerns.

## 2023-01-28 ENCOUNTER — Telehealth: Payer: Self-pay | Admitting: Neurology

## 2023-01-28 ENCOUNTER — Other Ambulatory Visit: Payer: Self-pay

## 2023-01-28 MED ORDER — GABAPENTIN 300 MG PO CAPS: 300.0000 mg | ORAL_CAPSULE | Freq: Three times a day (TID) | ORAL | 0 refills | Status: DC

## 2023-01-28 NOTE — Telephone Encounter (Signed)
Patient called needing a refill on Gabapentin 300mg  , please send to [facility] on [name]

## 2023-01-28 NOTE — Telephone Encounter (Signed)
Appointment has been scheduled for 02/04/23

## 2023-02-03 ENCOUNTER — Ambulatory Visit: Payer: PPO | Admitting: Podiatry

## 2023-02-03 DIAGNOSIS — L97412 Non-pressure chronic ulcer of right heel and midfoot with fat layer exposed: Secondary | ICD-10-CM | POA: Diagnosis not present

## 2023-02-04 ENCOUNTER — Ambulatory Visit: Payer: PPO | Admitting: Neurology

## 2023-02-04 ENCOUNTER — Encounter: Payer: Self-pay | Admitting: Neurology

## 2023-02-04 VITALS — BP 129/49 | HR 80 | Ht 71.0 in | Wt 188.0 lb

## 2023-02-04 DIAGNOSIS — G500 Trigeminal neuralgia: Secondary | ICD-10-CM

## 2023-02-04 MED ORDER — GABAPENTIN 300 MG PO CAPS: 300.0000 mg | ORAL_CAPSULE | Freq: Two times a day (BID) | ORAL | 3 refills | Status: DC

## 2023-02-04 NOTE — Progress Notes (Signed)
Follow-up Visit   Date: 02/04/23    Erik [name] MRN: [id number] DOB: 09-14-1940   Interim History: Erik [name] is a 82 y.o. right-handed Caucasian male with atrial fibrillation, hyperlipidemia, hypertension, GERD, CAD, CHF (EF 30%), diabetes mellitus, and BPH returning to the clinic for follow-up of right trigeminal neuralgia.  The patient was accompanied to the clinic by self.  IMPRESSION/PLAN: Right idiopathic trigeminal neuralgia, controlled.  He has spells of pain since 2000s.  MRI orbit in May 2019 which does not show trigeminal nerve compression/vascular loop.  - Continue gabapentin 300mg  twice daily  - Continue oxcarbazepine 150mg  half tablet twice daily (75mg   BID)  2.  Diabetic neuropathy with numbness in the feet and lower legs complicated by right heel ulcer  - Patient educated on daily foot inspection, fall prevention, and safety precautions around the home.   Return to clinic in 1 year  ------------------------------------------------------ History of present illness: Since 2000, he had had three spells of trigeminal neuralgia, the most recent episode started 6 months ago. He has extreme sensitivity of the right temple, jaw, cheek, lips, and nose on the right side.  It feels as if he is touching an electric fence.  Symptoms are intermittent and occur daily, lasting about 5-15 seconds.  If he tries to be still, he feels that it alleviates pain quicker.  His pain is exacerbated by brushing his teeth, combing hair, shampooing on the right scalp, and chewing. He was started on carbamazepine 100mg  twice daily and does not appreciate any improvement.  In the past, he used Tegretol and had symptoms resolve within 6 weeks.    UPDATE 11/28/2016:    He did not have any benefit with Tegretol 200mg  BID so he was started on gabapentin 300mg  BID and endorses marked improvement.  He now gets very transient facial pain, lasting only a few seconds and occurring once every few weeks.   He has no new complaints.   UPDATE 06/20/2021:  He was doing well and pain resolved for 3 years.  About a month ago, he started having right sided shooting pain over the face, similar to what he has experienced in the past. It is worse when he opens his jaw wide, such as when eating a hamburger. His PCP started him on gabapentin 100mg  TID which has reduced the frequency of pain to 2-3 times per day.  He now had a dull toothache pain over the side of the face.    UPDATE 02/04/2023:  He is here for 1 year follow-up visit.  Since taking oxcarbamazepine 75mg  (half 150mg  tab) twice daily + gabapentin 300mg  BID, his pain has significant improved.  It has been several months since he last has right facial pain.  He has a right heel ulcer which is followed by podiatry/would care.   Medications:  Current Outpatient Medications on File Prior to Visit  Medication Sig Dispense Refill   acetaminophen (TYLENOL) 325 MG tablet Take 2 tablets (650 mg total) by mouth every 6 (six) hours as needed for mild pain (or Fever >/= 101).     atorvastatin (LIPITOR) 40 MG tablet Take 1 tablet by mouth daily.     Baclofen 5 MG TABS Take 1 tablet (5 mg total) by mouth at bedtime as needed. 10 tablet 0   BD PEN NEEDLE NANO U/F 32G X 4 MM MISC USE 1 NEEDLE WITH INSULIN INJECTION ONCE DAILY  2   Blood Glucose Calibration (TRUE METRIX LEVEL 1) Low SOLN USE TO  CALIBRATE GLUCOMETER ONCE MONTHY OR WITH EACH NEW VIAL OF STRIPS, WHICHEVER COMES FIRST     Blood Glucose Monitoring Suppl (TRUE METRIX AIR GLUCOSE METER) w/Device KIT use to check blood sugar three times daily prior to meals. E11.29     Coenzyme Q10 (COQ10) 200 MG CAPS Take 100 mg by mouth daily.      Continuous Blood Gluc Receiver (FREESTYLE LIBRE 2 READER) DEVI See admin instructions.     Continuous Blood Gluc Sensor (FREESTYLE LIBRE 2 SENSOR) MISC Inject into the skin every 14 (fourteen) days.     dapagliflozin propanediol (FARXIGA) 10 MG TABS tablet Take 1 tablet (10 mg  total) by mouth daily before breakfast. 30 tablet 6   finasteride (PROSCAR) 5 MG tablet Take 5 mg by mouth once daily  3   furosemide (LASIX) 40 MG tablet Take 1 tablet (40 mg total) by mouth daily 30 tablet 5   gabapentin (NEURONTIN) 300 MG capsule Take 1 capsule (300 mg total) by mouth 3 (three) times daily. (Patient taking differently: Take 300 mg by mouth 2 (two) times daily.) 90 capsule 0   glucose blood (TRUE METRIX BLOOD GLUCOSE TEST) test strip use to check blood sugar three times daily prior to meals. E11.29     ipratropium (ATROVENT) 0.03 % nasal spray      lidocaine (LIDODERM) 5 % Place 1 patch onto the skin daily. Remove & Discard patch within 12 hours or as directed by MD 30 patch 0   metoprolol succinate (TOPROL XL) 50 MG 24 hr tablet Take 1.5 tablets (75 mg total) by mouth daily. Take with or immediately following a meal. 135 tablet 1   moxifloxacin (VIGAMOX) 0.5 % ophthalmic solution Place 1 drop into the left eye 4 (four) times daily.     Multiple Vitamin (MULTIVITAMIN) capsule Take 1 capsule by mouth daily.     OXcarbazepine (TRILEPTAL) 150 MG tablet Take 1 tablet (150 mg total) by mouth 2 (two) times daily. 60 tablet 3   pantoprazole (PROTONIX) 20 MG tablet Take 20 mg by mouth daily.     potassium chloride SA (KLOR-CON M) 20 MEQ tablet Take 1 tablet (20 mEq total) by mouth daily for 5 days only.  Take in concurrence with 5 day increased lasix. 5 tablet 0   PROLENSA 0.07 % SOLN      Rivaroxaban (XARELTO) 15 MG TABS tablet TAKE 1 TABLET(15 MG) BY MOUTH DAILY WITH SUPPER 90 tablet 1   selenium 200 MCG TABS tablet Take 200 mcg by mouth daily.     silver sulfADIAZINE (SILVADENE) 1 % cream Apply 1 Application topically daily. 50 g 0   tamsulosin (FLOMAX) 0.4 MG CAPS capsule Take 0.4 mg by mouth at bedtime.     TOUJEO SOLOSTAR 300 UNIT/ML SOPN Inject 36 Units as directed daily.   4   Trospium Chloride 60 MG CP24      TRUEplus Lancets 33G MISC use to check blood sugar three times daily  prior to meals. E11.29     doxycycline (VIBRA-TABS) 100 MG tablet Take 1 tablet (100 mg total) by mouth 2 (two) times daily. (Patient not taking: Reported on 02/04/2023) 14 tablet 0   No current facility-administered medications on file prior to visit.    Allergies:  Allergies  Allergen Reactions   Claritin [Loratadine] Other (See Comments)    Urinary retention   Tradjenta [Linagliptin] Other (See Comments)    High blood sugar   Imipramine     Other reaction(s): hallucination/dizziness  Vital Signs:  BP (!) 129/49   Pulse 80   Ht 5\' 11"  (1.803 m)   Wt 188 lb (85.3 kg)   SpO2 97%   BMI 26.22 kg/m   Neurological Exam: MENTAL STATUS including orientation to time, place, person, recent and remote memory, attention span and concentration, language, and fund of knowledge is normal.  Speech is not dysarthric.  CRANIAL NERVES:  Pupils equal round and reactive to light.  Normal conjugate, extra-ocular eye movements in all directions of gaze.  No ptosis. Normal facial sensation.  Face is symmetric. Palate elevates symmetrically.  Tongue is midline. Smile triggers right facial pain.    MOTOR:  Motor strength is 5/5 in all extremities.    COORDINATION/GAIT:   Gait narrow based and stable.   Data:  MRI orbit wwo contrast 10/11/2017: Negative for orbital mass. Question mild enhancement of the eyelid bilaterally which could be due to inflammation. This is symmetric and could also be due to artifact on the postcontrast images related to fat suppression technique. 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       Thank you for allowing me to participate in patient's care.  If I can answer any additional questions, I would be pleased to do so.    Sincerely,    Tamma Brigandi K. Allena Katz, DO

## 2023-02-09 NOTE — Progress Notes (Signed)
Subjective: Chief Complaint  Patient presents with   Foot Ulcer    Right heel ulcer follow up  Pt stated that he feels like it is trying to heal up      82 year old male presents  for evaluation of heel ulceration right foot.  He feels that the wound is trying to heal.  He does not report any drainage or pus but the wound is still present.  Denies any fevers or chills.  He has no other concerns today.  Objective: AAO x3, NAD DP/PT pulses palpable bilaterally, CRT less than 3 seconds On the heels hyperkeratotic tissue with central granular wound.  Overall the wound is more superficial.  It measures about 0.5 x 0.4 x 0.3 cm and appears to be slowly filling then.  No surrounding erythema, ascending cellulitis.  No fluctuation or crepitation there is no malodor.  There is no obvious signs of infection today.   No pain with calf compression, swelling, warmth, erythema     Assessment: Right heel ulceration, abscess; onycholysis  Plan: -All treatment options discussed with the patient including all alternatives, risks, complications.  -Medically necessary wound debridement was performed today.  Did sharply debride the ulceration down to healthy, bleeding granular tissue.  Pre and post measurements are the same.  I sharply debrided the wound #312 with scalpel to debride any nonviable devitalized tissue.  Minimal blood loss and hemostasis due to manual compression.  Cleaned the area with saline.  Silvadene was applied followed by dressing.  I did order dressings to be delivered to his house form [facility]. I have ordered a silver alginate dressing.  -Continue offloading at all times However given slow healing of the ulceration refer to vascular surgery-[facility] 11/15/2022- WNL -Referral to the wound care center -Monitor for any clinical signs or symptoms of infection and directed to call the office immediately should any occur or go to the ER.  Return in about 3 weeks (around 02/24/2023) for ulcer  heel.  Vivi Barrack DPM

## 2023-02-18 ENCOUNTER — Encounter (HOSPITAL_BASED_OUTPATIENT_CLINIC_OR_DEPARTMENT_OTHER): Payer: PPO | Attending: Internal Medicine | Admitting: Internal Medicine

## 2023-02-18 DIAGNOSIS — I480 Paroxysmal atrial fibrillation: Secondary | ICD-10-CM | POA: Insufficient documentation

## 2023-02-18 DIAGNOSIS — E11621 Type 2 diabetes mellitus with foot ulcer: Secondary | ICD-10-CM | POA: Diagnosis not present

## 2023-02-18 DIAGNOSIS — E11622 Type 2 diabetes mellitus with other skin ulcer: Secondary | ICD-10-CM | POA: Insufficient documentation

## 2023-02-18 DIAGNOSIS — L97418 Non-pressure chronic ulcer of right heel and midfoot with other specified severity: Secondary | ICD-10-CM | POA: Diagnosis not present

## 2023-02-18 DIAGNOSIS — E1122 Type 2 diabetes mellitus with diabetic chronic kidney disease: Secondary | ICD-10-CM | POA: Insufficient documentation

## 2023-02-18 DIAGNOSIS — I87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: Secondary | ICD-10-CM | POA: Insufficient documentation

## 2023-02-18 DIAGNOSIS — N183 Chronic kidney disease, stage 3 unspecified: Secondary | ICD-10-CM | POA: Insufficient documentation

## 2023-02-18 DIAGNOSIS — L97811 Non-pressure chronic ulcer of other part of right lower leg limited to breakdown of skin: Secondary | ICD-10-CM | POA: Insufficient documentation

## 2023-02-18 DIAGNOSIS — E1151 Type 2 diabetes mellitus with diabetic peripheral angiopathy without gangrene: Secondary | ICD-10-CM | POA: Diagnosis not present

## 2023-02-18 DIAGNOSIS — I130 Hypertensive heart and chronic kidney disease with heart failure and stage 1 through stage 4 chronic kidney disease, or unspecified chronic kidney disease: Secondary | ICD-10-CM | POA: Insufficient documentation

## 2023-02-19 NOTE — Progress Notes (Signed)
tested every day: Yes Tested : Genitourinary Medical History: Past Medical History Notes: CKD stage III benign prostatic hyperplasia Neurologic Medical History: Positive for: Neuropathy Psychiatric Medical History: Past Medical History NotesMarland Kitchen OMA, SKUFCA ([id number]) [phone].pdf Page 9 of 10 depression Immunizations Pneumococcal Vaccine: Received Pneumococcal  Vaccination: No Implantable Devices No devices added Hospitalization / Surgery History Type of Hospitalization/Surgery hysterectomy 07/14 cystoscopy 7/14 c-section 1993 cholecystectomy Family and Social History Cancer: Yes - Father,Maternal Grandparents; Heart Disease: Yes - Mother; Never smoker; Marital Status - Single; Alcohol Use: Never; Drug Use: No History; Caffeine Use: Never; Financial Concerns: No; Food, Clothing or Shelter Needs: No; Support System Lacking: No; Transportation Concerns: No Electronic Signature(s) Signed: 02/18/2023 4:52:49 PM By: [name] MD Signed: 02/18/2023 5:19:03 PM By: Shawn Stall RN, BSN Signed: 02/19/2023 2:21:02 PM By: Redmond Pulling RN, BSN Entered By: Redmond Pulling on 02/18/2023 13:19:14 -------------------------------------------------------------------------------- SuperBill Details Patient Name: Date of Service: [name] 02/18/2023 Medical Record Number: [id number] Patient Account Number: [id number] Date of Birth/Sex: Treating RN: 09/07/1940 (82 y.o. 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 L97.811 Non-pressure chronic ulcer of other part of right lower leg limited to breakdown of skin I87.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 Facility Procedures : CPT4 Code: 01601093 Description: 23557 - WOUND CARE VISIT-LEV 2 EST PT Modifier: Quantity: 1 : CPT4 Code: 32202542 Description: 11042 - DEB SUBQ TISSUE 20 SQ CM/< ICD-10 Diagnosis Description 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 Modifier: ity Quantity:  1 Physician Procedures : CPT4 Code Description Modifier [id number] 99204 - WC PHYS LEVEL 4 - NEW PT 25 ICD-10 Diagnosis Description E11.621 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 L97.811  Non-pressure chronic ulcer of other part of right lower leg limited to breakdown of skin I87.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 CHANCELLER, SINSEL ([id number]) [id number] 11042 - WC PHYS SUBQ TISS 20 SQ CM 1 ICD-10 Diagnosis Description 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 Secured With: Coban Self-Adherent Wrap 4x5 (in/yd) 1 x Per Week/30 Days Discharge Instructions: Secure with Coban as directed. Secured With: [employer], 4.5x3.1 (in/yd) 1 x Per Week/30 Days Discharge Instructions: Secure with Kerlix as directed. WOUND #2: - Lower Leg Wound Laterality: Right, Anterior Cleanser: Soap and Water 1 x Per Week/30 Days Discharge Instructions: May shower and wash wound with dial antibacterial soap and water prior to dressing change. Cleanser: Vashe 5.8 (oz) 1 x Per Week/30 Days Discharge Instructions: Cleanse the wound with Vashe prior to applying a clean dressing using gauze  sponges, not tissue or cotton balls. Peri-Wound Care: Triamcinolone 15 (g) 1 x Per Week/30 Days Discharge Instructions: Use triamcinolone 15 (g) as directed Peri-Wound Care: Sween Lotion (Moisturizing lotion) 1 x Per Week/30 Days Discharge Instructions: Apply moisturizing lotion as directed Prim Dressing: Hydrofera Blue Ready Transfer Foam, 2.5x2.5 (in/in) 1 x Per Week/30 Days ary Discharge Instructions: Apply directly to wound bed as directed Secondary Dressing: Woven Gauze Sponge, Non-Sterile 4x4 in 1 x Per Week/30 Days Discharge Instructions: Apply over primary dressing as directed. Secured With: Coban Self-Adherent Wrap 4x5 (in/yd) 1 x Per Week/30 Days Discharge Instructions: Secure with Coban as directed. Secured With: [employer], 4.5x3.1 (in/yd) 1 x Per Week/30 Days Discharge Instructions: Secure with Kerlix as directed. 1. 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 However this did seem to start off as an infection. Debridement is noted. We are going to use Hydrofera Blue, heel cup, gauze and Coban. 2. We also added some Hydrofera Blue to the wound on the right anterior lower leg and wrapped him with Kerlix Coban 3. We gave him a heel offloading boot. Some involved discussion about a total contact cast. He is a very active man at home drives all of this would make casting the right foot somewhat difficult for him. Nevertheless I told him this is probably the best way to offload this area. 4. I saw no evidence of infection no need for antibiotics. The information collected by Dr. Loreta Ave did not suggest underlying osteomyelitis with an MRI in June inflammatory markers and serial x-rays. 5. I am hopeful to be able to get him to agree to a total contact cast Electronic Signature(s) Signed: 02/18/2023 4:52:49 PM By: [name] MD Entered By: [name] on 02/18/2023 14:40:43 -------------------------------------------------------------------------------- HxROS  Details Patient Name: Date of Service: [name] 02/18/2023 12:30 PM Medical Record Number: [id number] Patient Account Number: [id number] Date of Birth/Sex: Treating RN: 1940-09-13 (82 y.o. [name] [name] ([id number]) [id number] Page 8 of 10 Primary Care Provider: Guerry Bruin Other Clinician: Referring Provider: Treating Provider/Extender: Wyline Beady in Treatment: 0 Constitutional Symptoms (General Health) Complaints and Symptoms: Negative for: Fatigue; Fever; Chills; Marked Weight Change Medical History: Past Medical History Notes: cellulitis left leg 10/2017 Eyes Complaints and Symptoms: Positive for: Glasses / Contacts Medical History: Past Medical History Notes: cataract surgery about 88yrs ago Ear/Nose/Mouth/Throat Complaints and Symptoms: Negative for: Chronic sinus problems or rhinitis Respiratory Complaints and Symptoms: Negative for: Chronic or frequent coughs; Shortness of Breath Integumentary (Skin) Complaints and Symptoms: Positive for: Wounds - r heel, r LE Hematologic/Lymphatic Medical History: Positive for: Anemia Cardiovascular Medical History: Positive for: Arrhythmia - afib; Coronary Artery Disease; Hypertension; Peripheral Arterial Disease Past Medical History Notes: heart murmur PVCs Gastrointestinal Medical History: Past Medical History Notes: gerd gallstones Endocrine Medical History: Positive for: Type II Diabetes Treated with: Insulin Blood sugar  tested every day: Yes Tested : Genitourinary Medical History: Past Medical History Notes: CKD stage III benign prostatic hyperplasia Neurologic Medical History: Positive for: Neuropathy Psychiatric Medical History: Past Medical History NotesMarland Kitchen OMA, SKUFCA ([id number]) [phone].pdf Page 9 of 10 depression Immunizations Pneumococcal Vaccine: Received Pneumococcal  Vaccination: No Implantable Devices No devices added Hospitalization / Surgery History Type of Hospitalization/Surgery hysterectomy 07/14 cystoscopy 7/14 c-section 1993 cholecystectomy Family and Social History Cancer: Yes - Father,Maternal Grandparents; Heart Disease: Yes - Mother; Never smoker; Marital Status - Single; Alcohol Use: Never; Drug Use: No History; Caffeine Use: Never; Financial Concerns: No; Food, Clothing or Shelter Needs: No; Support System Lacking: No; Transportation Concerns: No Electronic Signature(s) Signed: 02/18/2023 4:52:49 PM By: [name] MD Signed: 02/18/2023 5:19:03 PM By: Shawn Stall RN, BSN Signed: 02/19/2023 2:21:02 PM By: Redmond Pulling RN, BSN Entered By: Redmond Pulling on 02/18/2023 13:19:14 -------------------------------------------------------------------------------- SuperBill Details Patient Name: Date of Service: [name] 02/18/2023 Medical Record Number: [id number] Patient Account Number: [id number] Date of Birth/Sex: Treating RN: 09/07/1940 (82 y.o. 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 L97.811 Non-pressure chronic ulcer of other part of right lower leg limited to breakdown of skin I87.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 Facility Procedures : CPT4 Code: [id number] Description: 23557 - WOUND CARE VISIT-LEV 2 EST PT Modifier: Quantity: 1 : CPT4 Code: 32202542 Description: 11042 - DEB SUBQ TISSUE 20 SQ CM/< ICD-10 Diagnosis Description 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 Modifier: ity Quantity:  1 Physician Procedures : CPT4 Code Description Modifier [id number] 99204 - WC PHYS LEVEL 4 - NEW PT 25 ICD-10 Diagnosis Description E11.621 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 L97.811  Non-pressure chronic ulcer of other part of right lower leg limited to breakdown of skin I87.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 CHANCELLER, SINSEL ([id number]) [id number] 11042 - WC PHYS SUBQ TISS 20 SQ CM 1 ICD-10 Diagnosis Description 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 02/18/2023 12:30 PM Medical Record Number: [id number] Patient Account Number: [id number] Date of Birth/Sex: Treating RN: 09/16/1940 (82 y.o. M) Primary Care Provider: Guerry Bruin Other Clinician: Referring Provider: Treating Provider/Extender: Wyline Beady in Treatment: 0 Constitutional Patient is hypertensive.. Pulse regular and within target range for patient.[name] Respirations regular, non-labored and within target range.[name] Appears in no distress. Respiratory work of breathing is normal. Bilateral breath sounds are clear and equal in all lobes with no wheezes, rales or rhonchi.. Cardiovascular Heart rhythm and rate regular, without murmur or gallop.. Pedal pulses were palpable on the right. Edema present in both extremities.. Notes Wound exam The area on the plantar aspect of his right heel is a small punched-out area. Some thickened skin around the edges and callus. I used a #5 curette to CROCKETT, RIDDER ([id number]) [id number].pdf Page 3 of 10 remove rolled edges around the wound to the level of the wound and some debris on the surface. Hemostasis with silver nitrate and direct pressure. There is no evidence of infection around this it does not probe  deeply to bone. On the right anterior lower leg superficial wound compatible with a denuded blister. He has what looks like to be chronic venous insufficiency in both legs and some stasis dermatitis Electronic Signature(s) Signed: 02/18/2023 4:52:49 PM By: [name] MD Entered By: [name] on 02/18/2023 14:38:33 -------------------------------------------------------------------------------- Physician Orders Details Patient Name: Date of Service: [name] 02/18/2023 12:30 PM Medical Record Number: [id number] Patient Account Number: [id number] Date of Birth/Sex: Treating RN: January 23, 1941 (82 y.o. Cline Cools Primary Care Provider: Guerry Bruin Other Clinician: Referring Provider: Treating Provider/Extender: Wyline Beady in Treatment: 0 Verbal / Phone Orders: No Diagnosis Coding Follow-up Appointments ppointment in 1 week. - Dr Leanord Hawking Return A Anesthetic Wound #1 Right Calcaneus (In clinic) Topical Lidocaine 5% applied to wound bed Wound #2 Right,Anterior Lower Leg (In clinic) Topical Lidocaine 5% applied to wound bed Bathing/ Shower/ Hygiene May shower with protection but do not get wound dressing(s) wet. Protect dressing(s) with water repellant cover (for example, large plastic bag) or a cast cover and may then take shower. - purchase cast protector from [facility], [facility] or [employer] Edema Control - Lymphedema / SCD / Other Bilateral Lower Extremities Elevate legs to the level of the heart or above for 30 minutes daily and/or when sitting for 3-4 times a day throughout the day. Avoid standing for long periods of time. Wound Treatment Wound #1 - Calcaneus Wound Laterality: Right Cleanser: Soap and Water 1 x Per Week/30 Days Discharge Instructions: May shower and wash wound with dial antibacterial soap and water prior to dressing change. Cleanser: Vashe 5.8 (oz) 1 x Per Week/30 Days Discharge Instructions: Cleanse the wound with  Vashe prior to applying a clean dressing using gauze sponges, not tissue or cotton balls. Peri-Wound Care: Triamcinolone 15 (g) 1 x Per Week/30 Days Discharge Instructions: Use triamcinolone 15 (g) as directed Peri-Wound Care: Sween Lotion (Moisturizing lotion) 1 x Per Week/30 Days Discharge Instructions: Apply moisturizing lotion as directed Prim Dressing: Hydrofera Blue Ready Transfer Foam, 2.5x2.5 (in/in) 1 x Per Week/30 Days ary Discharge Instructions: Apply directly to wound bed as directed Secondary Dressing: Woven Gauze Sponge, Non-Sterile 4x4 in 1 x Per Week/30 Days Discharge Instructions: Apply over primary dressing as  Instructions: Apply over primary dressing as directed. Secured With: Coban Self-Adherent Wrap 4x5 (in/yd) 1 x Per Week/30 Days Discharge Instructions: Secure with Coban as directed. Secured With: [employer], 4.5x3.1 (in/yd) 1 x Per Week/30 Days Discharge Instructions: Secure with Kerlix as directed. WOUND #2: - Lower Leg Wound Laterality: Right, Anterior Cleanser: Soap and Water 1 x Per Week/30 Days Discharge Instructions: May shower and wash wound with dial antibacterial soap and water prior to dressing change. Cleanser: Vashe 5.8 (oz) 1 x Per Week/30 Days Discharge Instructions: Cleanse the wound with Vashe prior to applying a clean dressing using gauze  sponges, not tissue or cotton balls. Peri-Wound Care: Triamcinolone 15 (g) 1 x Per Week/30 Days Discharge Instructions: Use triamcinolone 15 (g) as directed Peri-Wound Care: Sween Lotion (Moisturizing lotion) 1 x Per Week/30 Days Discharge Instructions: Apply moisturizing lotion as directed Prim Dressing: Hydrofera Blue Ready Transfer Foam, 2.5x2.5 (in/in) 1 x Per Week/30 Days ary Discharge Instructions: Apply directly to wound bed as directed Secondary Dressing: Woven Gauze Sponge, Non-Sterile 4x4 in 1 x Per Week/30 Days Discharge Instructions: Apply over primary dressing as directed. Secured With: Coban Self-Adherent Wrap 4x5 (in/yd) 1 x Per Week/30 Days Discharge Instructions: Secure with Coban as directed. Secured With: [employer], 4.5x3.1 (in/yd) 1 x Per Week/30 Days Discharge Instructions: Secure with Kerlix as directed. 1. 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 However this did seem to start off as an infection. Debridement is noted. We are going to use Hydrofera Blue, heel cup, gauze and Coban. 2. We also added some Hydrofera Blue to the wound on the right anterior lower leg and wrapped him with Kerlix Coban 3. We gave him a heel offloading boot. Some involved discussion about a total contact cast. He is a very active man at home drives all of this would make casting the right foot somewhat difficult for him. Nevertheless I told him this is probably the best way to offload this area. 4. I saw no evidence of infection no need for antibiotics. The information collected by Dr. Loreta Ave did not suggest underlying osteomyelitis with an MRI in June inflammatory markers and serial x-rays. 5. I am hopeful to be able to get him to agree to a total contact cast Electronic Signature(s) Signed: 02/18/2023 4:52:49 PM By: [name] MD Entered By: [name] on 02/18/2023 14:40:43 -------------------------------------------------------------------------------- HxROS  Details Patient Name: Date of Service: [name] 02/18/2023 12:30 PM Medical Record Number: [id number] Patient Account Number: [id number] Date of Birth/Sex: Treating RN: 1940-09-13 (82 y.o. [name] [name] ([id number]) [id number] Page 8 of 10 Primary Care Provider: Guerry Bruin Other Clinician: Referring Provider: Treating Provider/Extender: Wyline Beady in Treatment: 0 Constitutional Symptoms (General Health) Complaints and Symptoms: Negative for: Fatigue; Fever; Chills; Marked Weight Change Medical History: Past Medical History Notes: cellulitis left leg 10/2017 Eyes Complaints and Symptoms: Positive for: Glasses / Contacts Medical History: Past Medical History Notes: cataract surgery about 88yrs ago Ear/Nose/Mouth/Throat Complaints and Symptoms: Negative for: Chronic sinus problems or rhinitis Respiratory Complaints and Symptoms: Negative for: Chronic or frequent coughs; Shortness of Breath Integumentary (Skin) Complaints and Symptoms: Positive for: Wounds - r heel, r LE Hematologic/Lymphatic Medical History: Positive for: Anemia Cardiovascular Medical History: Positive for: Arrhythmia - afib; Coronary Artery Disease; Hypertension; Peripheral Arterial Disease Past Medical History Notes: heart murmur PVCs Gastrointestinal Medical History: Past Medical History Notes: gerd gallstones Endocrine Medical History: Positive for: Type II Diabetes Treated with: Insulin Blood sugar  Signature(s) Signed: 02/18/2023 4:52:49 PM By: [name] MD Entered By: [name] on 02/18/2023 14:36:35 -------------------------------------------------------------------------------- Physical Exam Details Patient Name: Date of Service: [name] 02/18/2023 12:30 PM Medical Record Number: [id number] Patient Account Number: [id number] Date of Birth/Sex: Treating RN: 09/16/1940 (82 y.o. M) Primary Care Provider: Guerry Bruin Other Clinician: Referring Provider: Treating Provider/Extender: Wyline Beady in Treatment: 0 Constitutional Patient is hypertensive.. Pulse regular and within target range for patient.[name] Respirations regular, non-labored and within target range.[name] Appears in no distress. Respiratory work of breathing is normal. Bilateral breath sounds are clear and equal in all lobes with no wheezes, rales or rhonchi.. Cardiovascular Heart rhythm and rate regular, without murmur or gallop.. Pedal pulses were palpable on the right. Edema present in both extremities.. Notes Wound exam The area on the plantar aspect of his right heel is a small punched-out area. Some thickened skin around the edges and callus. I used a #5 curette to CROCKETT, RIDDER ([id number]) [id number].pdf Page 3 of 10 remove rolled edges around the wound to the level of the wound and some debris on the surface. Hemostasis with silver nitrate and direct pressure. There is no evidence of infection around this it does not probe  deeply to bone. On the right anterior lower leg superficial wound compatible with a denuded blister. He has what looks like to be chronic venous insufficiency in both legs and some stasis dermatitis Electronic Signature(s) Signed: 02/18/2023 4:52:49 PM By: [name] MD Entered By: [name] on 02/18/2023 14:38:33 -------------------------------------------------------------------------------- Physician Orders Details Patient Name: Date of Service: [name] 02/18/2023 12:30 PM Medical Record Number: [id number] Patient Account Number: [id number] Date of Birth/Sex: Treating RN: January 23, 1941 (82 y.o. Cline Cools Primary Care Provider: Guerry Bruin Other Clinician: Referring Provider: Treating Provider/Extender: Wyline Beady in Treatment: 0 Verbal / Phone Orders: No Diagnosis Coding Follow-up Appointments ppointment in 1 week. - Dr Leanord Hawking Return A Anesthetic Wound #1 Right Calcaneus (In clinic) Topical Lidocaine 5% applied to wound bed Wound #2 Right,Anterior Lower Leg (In clinic) Topical Lidocaine 5% applied to wound bed Bathing/ Shower/ Hygiene May shower with protection but do not get wound dressing(s) wet. Protect dressing(s) with water repellant cover (for example, large plastic bag) or a cast cover and may then take shower. - purchase cast protector from [facility], [facility] or [employer] Edema Control - Lymphedema / SCD / Other Bilateral Lower Extremities Elevate legs to the level of the heart or above for 30 minutes daily and/or when sitting for 3-4 times a day throughout the day. Avoid standing for long periods of time. Wound Treatment Wound #1 - Calcaneus Wound Laterality: Right Cleanser: Soap and Water 1 x Per Week/30 Days Discharge Instructions: May shower and wash wound with dial antibacterial soap and water prior to dressing change. Cleanser: Vashe 5.8 (oz) 1 x Per Week/30 Days Discharge Instructions: Cleanse the wound with  Vashe prior to applying a clean dressing using gauze sponges, not tissue or cotton balls. Peri-Wound Care: Triamcinolone 15 (g) 1 x Per Week/30 Days Discharge Instructions: Use triamcinolone 15 (g) as directed Peri-Wound Care: Sween Lotion (Moisturizing lotion) 1 x Per Week/30 Days Discharge Instructions: Apply moisturizing lotion as directed Prim Dressing: Hydrofera Blue Ready Transfer Foam, 2.5x2.5 (in/in) 1 x Per Week/30 Days ary Discharge Instructions: Apply directly to wound bed as directed Secondary Dressing: Woven Gauze Sponge, Non-Sterile 4x4 in 1 x Per Week/30 Days Discharge Instructions: Apply over primary dressing as  Signature(s) Signed: 02/18/2023 4:52:49 PM By: [name] MD Entered By: [name] on 02/18/2023 14:36:35 -------------------------------------------------------------------------------- Physical Exam Details Patient Name: Date of Service: [name] 02/18/2023 12:30 PM Medical Record Number: [id number] Patient Account Number: [id number] Date of Birth/Sex: Treating RN: 09/16/1940 (82 y.o. M) Primary Care Provider: Guerry Bruin Other Clinician: Referring Provider: Treating Provider/Extender: Wyline Beady in Treatment: 0 Constitutional Patient is hypertensive.. Pulse regular and within target range for patient.[name] Respirations regular, non-labored and within target range.[name] Appears in no distress. Respiratory work of breathing is normal. Bilateral breath sounds are clear and equal in all lobes with no wheezes, rales or rhonchi.. Cardiovascular Heart rhythm and rate regular, without murmur or gallop.. Pedal pulses were palpable on the right. Edema present in both extremities.. Notes Wound exam The area on the plantar aspect of his right heel is a small punched-out area. Some thickened skin around the edges and callus. I used a #5 curette to CROCKETT, RIDDER ([id number]) [id number].pdf Page 3 of 10 remove rolled edges around the wound to the level of the wound and some debris on the surface. Hemostasis with silver nitrate and direct pressure. There is no evidence of infection around this it does not probe  deeply to bone. On the right anterior lower leg superficial wound compatible with a denuded blister. He has what looks like to be chronic venous insufficiency in both legs and some stasis dermatitis Electronic Signature(s) Signed: 02/18/2023 4:52:49 PM By: [name] MD Entered By: [name] on 02/18/2023 14:38:33 -------------------------------------------------------------------------------- Physician Orders Details Patient Name: Date of Service: [name] 02/18/2023 12:30 PM Medical Record Number: [id number] Patient Account Number: [id number] Date of Birth/Sex: Treating RN: January 23, 1941 (82 y.o. Cline Cools Primary Care Provider: Guerry Bruin Other Clinician: Referring Provider: Treating Provider/Extender: Wyline Beady in Treatment: 0 Verbal / Phone Orders: No Diagnosis Coding Follow-up Appointments ppointment in 1 week. - Dr Leanord Hawking Return A Anesthetic Wound #1 Right Calcaneus (In clinic) Topical Lidocaine 5% applied to wound bed Wound #2 Right,Anterior Lower Leg (In clinic) Topical Lidocaine 5% applied to wound bed Bathing/ Shower/ Hygiene May shower with protection but do not get wound dressing(s) wet. Protect dressing(s) with water repellant cover (for example, large plastic bag) or a cast cover and may then take shower. - purchase cast protector from [facility], [facility] or [employer] Edema Control - Lymphedema / SCD / Other Bilateral Lower Extremities Elevate legs to the level of the heart or above for 30 minutes daily and/or when sitting for 3-4 times a day throughout the day. Avoid standing for long periods of time. Wound Treatment Wound #1 - Calcaneus Wound Laterality: Right Cleanser: Soap and Water 1 x Per Week/30 Days Discharge Instructions: May shower and wash wound with dial antibacterial soap and water prior to dressing change. Cleanser: Vashe 5.8 (oz) 1 x Per Week/30 Days Discharge Instructions: Cleanse the wound with  Vashe prior to applying a clean dressing using gauze sponges, not tissue or cotton balls. Peri-Wound Care: Triamcinolone 15 (g) 1 x Per Week/30 Days Discharge Instructions: Use triamcinolone 15 (g) as directed Peri-Wound Care: Sween Lotion (Moisturizing lotion) 1 x Per Week/30 Days Discharge Instructions: Apply moisturizing lotion as directed Prim Dressing: Hydrofera Blue Ready Transfer Foam, 2.5x2.5 (in/in) 1 x Per Week/30 Days ary Discharge Instructions: Apply directly to wound bed as directed Secondary Dressing: Woven Gauze Sponge, Non-Sterile 4x4 in 1 x Per Week/30 Days Discharge Instructions: Apply over primary dressing as

## 2023-02-19 NOTE — Progress Notes (Signed)
Erik [name], LIBONATI ([id number]) [id number] Page 1 of 4 Visit Report for 02/18/2023 Abuse Risk Screen Details Patient Name: Date of Service: TO [name] 02/18/2023 12:30 PM Medical Record Number: [id number] Patient Account Number: [id number] Date of Birth/Sex: Treating RN: [birth year] (82 y.o. Erik [name] Primary Care Tila Millirons: Guerry Bruin Other Clinician: Referring Kostas Marrow: Treating Zylan Almquist/Extender: Wyline Beady in Treatment: 0 Abuse Risk Screen Items Answer ABUSE RISK SCREEN: Has anyone close to you tried to hurt or harm you recentlyo No Do you feel uncomfortable with anyone in your familyo No Has anyone forced you do things that you didnt want to doo No Electronic Signature(s) Signed: 02/19/2023 2:21:02 PM By: Redmond Pulling RN, BSN Entered By: Redmond Pulling on 02/18/2023 13:19:25 -------------------------------------------------------------------------------- Activities of Daily Living Details Patient Name: Date of Service: TO [name] 02/18/2023 12:30 PM Medical Record Number: [id number] Patient Account Number: [id number] Date of Birth/Sex: Treating RN: [birth year] (82 y.o. Erik [name] Primary Care Shawntay Prest: Guerry Bruin Other Clinician: Referring Jennika Ringgold: Treating Jillann Charette/Extender: Wyline Beady in Treatment: 0 Activities of Daily Living Items Answer Activities of Daily Living (Please select one for each item) Drive Automobile Completely Able T Medications ake Completely Able Use T elephone Completely Able Care for Appearance Completely Able Use T oilet Completely Able Bath / Shower Completely Able Dress Self Completely Able Feed Self Completely Able Walk Completely Able Get In / Out Bed Completely Able Housework Completely Able Prepare Meals Completely Able Handle Money Completely Able Shop for Self Completely Able Electronic  Signature(s) Signed: 02/19/2023 2:21:02 PM By: Redmond Pulling RN, BSN Entered By: Redmond Pulling on 02/18/2023 13:20:03 [name] ([id number]) [phone] [id number] Page 2 of 4 -------------------------------------------------------------------------------- Education Screening Details Patient Name: Date of Service: TO [name] 02/18/2023 12:30 PM Medical Record Number: [id number] Patient Account Number: [id number] Date of Birth/Sex: Treating RN: 10-15-1940 (82 y.o. Erik [name] Primary Care Rekita Miotke: Guerry Bruin Other Clinician: Referring Thersia Petraglia: Treating Lakresha Stifter/Extender: Wyline Beady in Treatment: 0 Primary Learner Assessed: Patient Learning Preferences/Education Level/Primary Language Learning Preference: Explanation, Demonstration, Printed Material Preferred Language: English Cognitive Barrier Language Barrier: No Translator Needed: No Memory Deficit: No Emotional Barrier: No Physical Barrier Impaired Vision: No Impaired Hearing: Yes Decreased Hand dexterity: No Knowledge/Comprehension Knowledge Level: High Comprehension Level: High Ability to understand written instructions: High Ability to understand verbal instructions: High Motivation Anxiety Level: Calm Cooperation: Cooperative Education Importance: Acknowledges Need Interest in Health Problems: Asks Questions Perception: Coherent Willingness to Engage in Self-Management High Activities: Readiness to Engage in Self-Management High Activities: Electronic Signature(s) Signed: 02/19/2023 2:21:02 PM By: Redmond Pulling RN, BSN Entered By: Redmond Pulling on 02/18/2023 13:20:47 -------------------------------------------------------------------------------- Fall Risk Assessment Details Patient Name: Date of Service: TO [name], [name] 02/18/2023 12:30 PM Medical Record Number: [id number] Patient Account Number: [id number] Date of Birth/Sex:  Treating RN: 1940/08/28 (82 y.o. Erik [name] Primary Care Michele Judy: Guerry Bruin Other Clinician: Referring Adajah Cocking: Treating Orvella Digiulio/Extender: Wyline Beady in Treatment: 0 Fall Risk Assessment Items Have you had 2 or more falls in the last 12 monthso 0 Yes Have you had any fall that resulted in injury in the last 12 monthso 0 No [address] ESLEY, CREGER ([id number]) [id number] Page 3 of 4 History of falling - immediate or within 3 months 25 Yes Secondary diagnosis (Do you have 2 or more medical diagnoseso) 15 Yes Ambulatory aid None/bed rest/wheelchair/nurse 0 Yes  Crutches/cane/walker 0 No Furniture 0 No Intravenous therapy Access/Saline/Heparin Lock 0 No Gait/Transferring Normal/ bed rest/ wheelchair 0 Yes Weak (short steps with or without shuffle, stooped but able to lift head while walking, may seek 0 No support from furniture) Impaired (short steps with shuffle, may have difficulty arising from chair, head down, impaired 0 No balance) Mental Status Oriented to own ability 0 Yes Electronic Signature(s) Signed: 02/19/2023 2:21:02 PM By: Redmond Pulling RN, BSN Entered By: Redmond Pulling on 02/18/2023 13:22:19 -------------------------------------------------------------------------------- Foot Assessment Details Patient Name: Date of Service: TO Erik [name] W. 02/18/2023 12:30 PM Medical Record Number: [id number] Patient Account Number: [id number] Date of Birth/Sex: Treating RN: 10-Feb-1941 (82 y.o. Erik [name] Primary Care Tersa Fotopoulos: Guerry Bruin Other Clinician: Referring Vernard Gram: Treating Lovelee Forner/Extender: Wyline Beady in Treatment: 0 Foot Assessment Items Site Locations + = Sensation present, - = Sensation absent, C = Callus, U = Ulcer R = Redness, W = Warmth, M = Maceration, PU = Pre-ulcerative lesion F = Fissure, S = Swelling, D =  Dryness Assessment Right: Left: Other Deformity: No No Prior Foot Ulcer: No No Prior Amputation: No No Charcot Joint: No No Ambulatory Status: Ambulatory Without Help GaitEMERALD, CUNNANE ([id number]) [phone] [id number] Page 4 of 4 Electronic Signature(s) Signed: 02/19/2023 2:21:02 PM By: Redmond Pulling RN, BSN Entered By: Redmond Pulling on 02/18/2023 13:12:30 -------------------------------------------------------------------------------- Nutrition Risk Screening Details Patient Name: Date of Service: TO [name] 02/18/2023 12:30 PM Medical Record Number: [id number] Patient Account Number: [id number] Date of Birth/Sex: Treating RN: [birth year] (82 y.o. Erik [name] Primary Care Casidee Jann: Guerry Bruin Other Clinician: Referring Taryn Nave: Treating Genevie Elman/Extender: Wyline Beady in Treatment: 0 Height (in): Weight (lbs): Body Mass Index (BMI): Nutrition Risk Screening Items Score Screening NUTRITION RISK SCREEN: I have an illness or condition that made me change the kind and/or amount of food I eat 0 No I eat fewer than two meals per day 0 No I eat few fruits and vegetables, or milk products 0 No I have three or more drinks of beer, liquor or wine almost every day 0 No I have tooth or mouth problems that make it hard for me to eat 0 No I don't always have enough money to buy the food I need 0 No I eat alone most of the time 0 No I take three or more different prescribed or over-the-counter drugs a day 1 Yes Without wanting to, I have lost or gained 10 pounds in the last six months 0 No I am not always physically able to shop, cook and/or feed myself 0 No Nutrition Protocols Good Risk Protocol Moderate Risk Protocol High Risk Proctocol Risk Level: Good Risk Score: 1 Electronic Signature(s) Signed: 02/19/2023 2:21:02 PM By: Redmond Pulling RN, BSN Entered By: Redmond Pulling on 02/18/2023 13:22:51

## 2023-02-19 NOTE — Progress Notes (Signed)
three years: Yes Total Score: 75 Level Of Care: New/Established - Level 2 Electronic Signature(s) Signed: 02/19/2023 2:21:02 PM By: Redmond Pulling RN, BSN Entered By: Redmond Pulling on 02/18/2023 14:56:25 Encounter Discharge Information Details -------------------------------------------------------------------------------- [name] ([id number]) [phone].pdf Page 3 of 10 Patient Name: Date of Service: TO [name] 02/18/2023 12:30 PM Medical Record Number: [id number] Patient Account Number: [id number] Date of Birth/Sex: Treating RN: [birth year] (82 y.o. Cline Cools Primary Care Natilee Gauer: Guerry Bruin Other Clinician: Referring Sheray Grist: Treating Catilyn Boggus/Extender: Wyline Beady in Treatment: 0 Encounter Discharge Information Items Post Procedure Vitals Discharge Condition: Stable Temperature (F): 97.7 Ambulatory Status: Ambulatory Pulse (bpm): 77 Discharge Destination: Home Respiratory Rate (breaths/min): 18 Transportation: Private Auto Blood Pressure (mmHg): 153/65 Accompanied By: self Schedule Follow-up Appointment: Yes Clinical Summary of Care: Patient Declined Electronic Signature(s) Signed: 02/19/2023 2:21:02 PM By: Redmond Pulling RN, BSN Entered By: Redmond Pulling on 02/18/2023 14:57:45 -------------------------------------------------------------------------------- Lower Extremity Assessment Details Patient Name: Date of Service: TO [name] 02/18/2023 12:30 PM Medical Record Number: [id number] Patient Account Number: [id number] Date of Birth/Sex: Treating RN: [birth year] (82 y.o. Cline Cools Primary Care  Keondre Markson: Guerry Bruin Other Clinician: Referring Ngan Qualls: Treating Rayel Santizo/Extender: Wyline Beady in Treatment: 0 Edema Assessment Assessed: [name]: No] [name]: Yes] [Left: Edema] [Right: :] Calf Left: Right: Point of Measurement: From Medial Instep 41 cm Ankle Left: Right: Point of Measurement: From Medial Instep 27.5 cm Vascular Assessment Pulses: Dorsalis Pedis Palpable: [Right:Yes] Electronic Signature(s) Signed: 02/19/2023 2:21:02 PM By: Redmond Pulling RN, BSN Entered By: Redmond Pulling on 02/18/2023 13:08:04 -------------------------------------------------------------------------------- Multi Wound Chart Details Patient Name: Date of Service: TO [name] 02/18/2023 12:30 PM Medical Record Number: [id number] Patient Account Number: [id number] GARNIE, BLODGETT ([id number]) [phone].pdf Page 4 of 10 Date of Birth/Sex: Treating RN: [birth year] (82 y.o. 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 N/A Primary Etiology: Extremity N/A Diabetic Wound/Ulcer of the Lower N/A Secondary Etiology: Extremity Anemia, Hypertension, Type II Anemia, Arrhythmia, Coronary Artery N/A Comorbid History: Diabetes Disease, Hypertension, Peripheral Arterial Disease, Type II Diabetes, Neuropathy 01/21/2023 02/04/2023 N/A Date Acquired: 0 0 N/A Weeks of Treatment: Open Open N/A Wound Status: No No N/A Wound Recurrence: 0.3x0.6x0.2 3.5x1.2x0.1 N/A Measurements L x W  x D (cm) 0.141 3.299 N/A A (cm) : rea 0.028 0.33 N/A Volume (cm) : 11 Starting Position 1 (o'clock): 4 Ending Position 1 (o'clock): 0.3 Maximum Distance 1 (cm): Yes No N/A Undermining: Grade 2 Full Thickness Without Exposed N/A Classification: Support Structures Medium Medium N/A Exudate A mount: Serosanguineous Serosanguineous N/A Exudate Type: red, brown red, brown N/A Exudate Color: Large (67-100%) Large (67-100%) N/A Granulation A mount: Red Pink N/A Granulation Quality: Small (1-33%) None Present (0%) N/A Necrotic A mount: Fat Layer (Subcutaneous Tissue): Yes Fat Layer (Subcutaneous Tissue): Yes N/A Exposed Structures: Fascia: No Tendon: No Muscle: No Joint: No Bone: No None None N/A Epithelialization: Debridement - Excisional N/A N/A Debridement: Pre-procedure Verification/Time Out 13:45 N/A N/A Taken: Lidocaine 5% topical ointment N/A N/A Pain Control: Subcutaneous N/A N/A Tissue Debrided: Skin/Subcutaneous Tissue N/A N/A Level: 0.14 N/A N/A Debridement A (sq cm): rea Curette N/A  N/A Instrument: Minimum N/A N/A Bleeding: Silver Nitrate N/A N/A Hemostasis A chieved: Procedure was tolerated well N/A N/A Debridement Treatment Response: 0.3x0.6x0.2 N/A N/A Post Debridement Measurements L x W x D (cm) 0.028 N/A N/A Post Debridement Volume: (cm) Debridement N/A N/A Procedures Performed: Treatment Notes Electronic Signature(s) Signed: 02/18/2023 4:52:49 PM By: [name] MD [name] ([id number]) By: [name] MD [phone].pdf Page 5 of 10 Signed: 02/18/2023 4:52:49 PM Entered By: [name] on 02/18/2023 14:19:44 -------------------------------------------------------------------------------- Multi-Disciplinary Care Plan Details Patient Name: Date of Service: TO [name] 02/18/2023 12:30 PM Medical Record Number: [id number] Patient Account Number: [id number] Date of Birth/Sex: Treating  RN: [birth year] (82 y.o. Cline Cools Primary Care Youcef Klas: Guerry Bruin Other Clinician: Referring Raquelle Pietro: Treating Heriberto Stmartin/Extender: Wyline Beady in Treatment: 0 Active Inactive Wound/Skin Impairment Nursing Diagnoses: Impaired tissue integrity Knowledge deficit related to ulceration/compromised skin integrity Goals: Patient/caregiver will verbalize understanding of skin care regimen Date Initiated: 02/18/2023 Target Resolution Date: 04/04/2023 Goal Status: Active Ulcer/skin breakdown will have a volume reduction of 30% by week 4 Date Initiated: 02/18/2023 Target Resolution Date: 03/18/2023 Goal Status: Active Interventions: Assess patient/caregiver ability to obtain necessary supplies Assess patient/caregiver ability to perform ulcer/skin care regimen upon admission and as needed Assess ulceration(s) every visit Provide education on ulcer and skin care Notes: Electronic Signature(s) Signed: 02/19/2023 2:21:02 PM By: Redmond Pulling RN, BSN Entered By: Redmond Pulling on 02/18/2023 13:54:38 -------------------------------------------------------------------------------- Pain Assessment Details Patient Name: Date of Service: TO [name] 02/18/2023 12:30 PM Medical Record Number: [id number] Patient Account Number: [id number] Date of Birth/Sex: Treating RN: 05-14-1941 (82 y.o. Cline Cools Primary Care Celenia Hruska: Guerry Bruin Other Clinician: Referring Dyna Figuereo: Treating Rj Pedrosa/Extender: Wyline Beady in Treatment: 0 Active Problems Location of Pain Severity and Description of Pain Patient Has Paino Yes Site Locations Duration of the Pain. SAVIER, KADLEC ([id number]) [id number] Page 6 of 10 Duration of the Pain. Constant / Intermittento Intermittent Rate the pain. Current Pain Level: 5 Pain Management and Medication Current Pain Management: Electronic  Signature(s) Signed: 02/19/2023 2:21:02 PM By: Redmond Pulling RN, BSN Entered By: Redmond Pulling on 02/18/2023 13:23:36 -------------------------------------------------------------------------------- Patient/Caregiver Education Details Patient Name: Date of Service: TO [name] 9/24/2024andnbsp12:30 PM Medical Record Number: [id number] Patient Account Number: [id number] Date of Birth/Gender: Treating RN: [birth year] (82 y.o. Cline Cools Primary Care Physician: Guerry Bruin Other Clinician: Referring Physician: Treating Physician/Extender: Wyline Beady in Treatment: 0 Education Assessment Education Provided To: Patient Education Topics Provided Wound/Skin Impairment: Methods: Explain/Verbal Responses: State content correctly Electronic Signature(s) Signed: 02/19/2023 2:21:02 PM By: Redmond Pulling RN, BSN Entered By: Redmond Pulling on 02/18/2023 13:54:53 -------------------------------------------------------------------------------- Wound Assessment Details Patient Name: Date of Service: TO [name] 02/18/2023 12:30 PM Medical Record Number: [id number] Patient Account Number: [id number] Date of Birth/Sex: Treating RN: [birth year] (82 y.o. Cline Cools Primary Care Eriverto Byrnes: Guerry Bruin Other Clinician: Referring Godwin Tedesco: Treating Romanda Turrubiates/Extender: Tyrann, Gruett ([id number]) [id number] Page 7 of 10 Weeks in Treatment: 0 Wound Status Wound Number: 1 Primary Etiology: Diabetic Wound/Ulcer of the Lower Extremity Wound Location: Right Calcaneus Wound Status: Open Wounding Event: Gradually Appeared Comorbid History: Anemia, Hypertension, Type II Diabetes Date Acquired: 01/21/2023 Weeks Of Treatment: 0 Clustered Wound: No Photos Wound Measurements Length: (cm) 0.3 Width: (cm) 0.6 Depth: (cm) 0.2 Area: (cm) 0.141 Volume: (cm) 0.028 % Reduction in Area: %  Reduction in Volume: Epithelialization: None Tunneling:  N/A Instrument: Minimum N/A N/A Bleeding: Silver Nitrate N/A N/A Hemostasis A chieved: Procedure was tolerated well N/A N/A Debridement Treatment Response: 0.3x0.6x0.2 N/A N/A Post Debridement Measurements L x W x D (cm) 0.028 N/A N/A Post Debridement Volume: (cm) Debridement N/A N/A Procedures Performed: Treatment Notes Electronic Signature(s) Signed: 02/18/2023 4:52:49 PM By: [name] MD [name] ([id number]) By: [name] MD [phone].pdf Page 5 of 10 Signed: 02/18/2023 4:52:49 PM Entered By: [name] on 02/18/2023 14:19:44 -------------------------------------------------------------------------------- Multi-Disciplinary Care Plan Details Patient Name: Date of Service: TO [name] 02/18/2023 12:30 PM Medical Record Number: [id number] Patient Account Number: [id number] Date of Birth/Sex: Treating  RN: [birth year] (82 y.o. Cline Cools Primary Care Youcef Klas: Guerry Bruin Other Clinician: Referring Raquelle Pietro: Treating Heriberto Stmartin/Extender: Wyline Beady in Treatment: 0 Active Inactive Wound/Skin Impairment Nursing Diagnoses: Impaired tissue integrity Knowledge deficit related to ulceration/compromised skin integrity Goals: Patient/caregiver will verbalize understanding of skin care regimen Date Initiated: 02/18/2023 Target Resolution Date: 04/04/2023 Goal Status: Active Ulcer/skin breakdown will have a volume reduction of 30% by week 4 Date Initiated: 02/18/2023 Target Resolution Date: 03/18/2023 Goal Status: Active Interventions: Assess patient/caregiver ability to obtain necessary supplies Assess patient/caregiver ability to perform ulcer/skin care regimen upon admission and as needed Assess ulceration(s) every visit Provide education on ulcer and skin care Notes: Electronic Signature(s) Signed: 02/19/2023 2:21:02 PM By: Redmond Pulling RN, BSN Entered By: Redmond Pulling on 02/18/2023 13:54:38 -------------------------------------------------------------------------------- Pain Assessment Details Patient Name: Date of Service: TO [name] 02/18/2023 12:30 PM Medical Record Number: [id number] Patient Account Number: [id number] Date of Birth/Sex: Treating RN: 05-14-1941 (82 y.o. Cline Cools Primary Care Celenia Hruska: Guerry Bruin Other Clinician: Referring Dyna Figuereo: Treating Rj Pedrosa/Extender: Wyline Beady in Treatment: 0 Active Problems Location of Pain Severity and Description of Pain Patient Has Paino Yes Site Locations Duration of the Pain. SAVIER, KADLEC ([id number]) [id number] Page 6 of 10 Duration of the Pain. Constant / Intermittento Intermittent Rate the pain. Current Pain Level: 5 Pain Management and Medication Current Pain Management: Electronic  Signature(s) Signed: 02/19/2023 2:21:02 PM By: Redmond Pulling RN, BSN Entered By: Redmond Pulling on 02/18/2023 13:23:36 -------------------------------------------------------------------------------- Patient/Caregiver Education Details Patient Name: Date of Service: TO [name] 9/24/2024andnbsp12:30 PM Medical Record Number: [id number] Patient Account Number: [id number] Date of Birth/Gender: Treating RN: [birth year] (82 y.o. Cline Cools Primary Care Physician: Guerry Bruin Other Clinician: Referring Physician: Treating Physician/Extender: Wyline Beady in Treatment: 0 Education Assessment Education Provided To: Patient Education Topics Provided Wound/Skin Impairment: Methods: Explain/Verbal Responses: State content correctly Electronic Signature(s) Signed: 02/19/2023 2:21:02 PM By: Redmond Pulling RN, BSN Entered By: Redmond Pulling on 02/18/2023 13:54:53 -------------------------------------------------------------------------------- Wound Assessment Details Patient Name: Date of Service: TO [name] 02/18/2023 12:30 PM Medical Record Number: [id number] Patient Account Number: [id number] Date of Birth/Sex: Treating RN: [birth year] (82 y.o. Cline Cools Primary Care Eriverto Byrnes: Guerry Bruin Other Clinician: Referring Godwin Tedesco: Treating Romanda Turrubiates/Extender: Tyrann, Gruett ([id number]) [id number] Page 7 of 10 Weeks in Treatment: 0 Wound Status Wound Number: 1 Primary Etiology: Diabetic Wound/Ulcer of the Lower Extremity Wound Location: Right Calcaneus Wound Status: Open Wounding Event: Gradually Appeared Comorbid History: Anemia, Hypertension, Type II Diabetes Date Acquired: 01/21/2023 Weeks Of Treatment: 0 Clustered Wound: No Photos Wound Measurements Length: (cm) 0.3 Width: (cm) 0.6 Depth: (cm) 0.2 Area: (cm) 0.141 Volume: (cm) 0.028 % Reduction in Area: %  Reduction in Volume: Epithelialization: None Tunneling:  three years: Yes Total Score: 75 Level Of Care: New/Established - Level 2 Electronic Signature(s) Signed: 02/19/2023 2:21:02 PM By: Redmond Pulling RN, BSN Entered By: Redmond Pulling on 02/18/2023 14:56:25 Encounter Discharge Information Details -------------------------------------------------------------------------------- [name] ([id number]) [phone].pdf Page 3 of 10 Patient Name: Date of Service: TO [name] 02/18/2023 12:30 PM Medical Record Number: [id number] Patient Account Number: [id number] Date of Birth/Sex: Treating RN: [birth year] (82 y.o. Cline Cools Primary Care Natilee Gauer: Guerry Bruin Other Clinician: Referring Sheray Grist: Treating Catilyn Boggus/Extender: Wyline Beady in Treatment: 0 Encounter Discharge Information Items Post Procedure Vitals Discharge Condition: Stable Temperature (F): 97.7 Ambulatory Status: Ambulatory Pulse (bpm): 77 Discharge Destination: Home Respiratory Rate (breaths/min): 18 Transportation: Private Auto Blood Pressure (mmHg): 153/65 Accompanied By: self Schedule Follow-up Appointment: Yes Clinical Summary of Care: Patient Declined Electronic Signature(s) Signed: 02/19/2023 2:21:02 PM By: Redmond Pulling RN, BSN Entered By: Redmond Pulling on 02/18/2023 14:57:45 -------------------------------------------------------------------------------- Lower Extremity Assessment Details Patient Name: Date of Service: TO [name] 02/18/2023 12:30 PM Medical Record Number: [id number] Patient Account Number: [id number] Date of Birth/Sex: Treating RN: [birth year] (82 y.o. Cline Cools Primary Care  Keondre Markson: Guerry Bruin Other Clinician: Referring Ngan Qualls: Treating Rayel Santizo/Extender: Wyline Beady in Treatment: 0 Edema Assessment Assessed: [name]: No] [name]: Yes] [Left: Edema] [Right: :] Calf Left: Right: Point of Measurement: From Medial Instep 41 cm Ankle Left: Right: Point of Measurement: From Medial Instep 27.5 cm Vascular Assessment Pulses: Dorsalis Pedis Palpable: [Right:Yes] Electronic Signature(s) Signed: 02/19/2023 2:21:02 PM By: Redmond Pulling RN, BSN Entered By: Redmond Pulling on 02/18/2023 13:08:04 -------------------------------------------------------------------------------- Multi Wound Chart Details Patient Name: Date of Service: TO [name] 02/18/2023 12:30 PM Medical Record Number: [id number] Patient Account Number: [id number] GARNIE, BLODGETT ([id number]) [phone].pdf Page 4 of 10 Date of Birth/Sex: Treating RN: [birth year] (82 y.o. 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 N/A Primary Etiology: Extremity N/A Diabetic Wound/Ulcer of the Lower N/A Secondary Etiology: Extremity Anemia, Hypertension, Type II Anemia, Arrhythmia, Coronary Artery N/A Comorbid History: Diabetes Disease, Hypertension, Peripheral Arterial Disease, Type II Diabetes, Neuropathy 01/21/2023 02/04/2023 N/A Date Acquired: 0 0 N/A Weeks of Treatment: Open Open N/A Wound Status: No No N/A Wound Recurrence: 0.3x0.6x0.2 3.5x1.2x0.1 N/A Measurements L x W  x D (cm) 0.141 3.299 N/A A (cm) : rea 0.028 0.33 N/A Volume (cm) : 11 Starting Position 1 (o'clock): 4 Ending Position 1 (o'clock): 0.3 Maximum Distance 1 (cm): Yes No N/A Undermining: Grade 2 Full Thickness Without Exposed N/A Classification: Support Structures Medium Medium N/A Exudate A mount: Serosanguineous Serosanguineous N/A Exudate Type: red, brown red, brown N/A Exudate Color: Large (67-100%) Large (67-100%) N/A Granulation A mount: Red Pink N/A Granulation Quality: Small (1-33%) None Present (0%) N/A Necrotic A mount: Fat Layer (Subcutaneous Tissue): Yes Fat Layer (Subcutaneous Tissue): Yes N/A Exposed Structures: Fascia: No Tendon: No Muscle: No Joint: No Bone: No None None N/A Epithelialization: Debridement - Excisional N/A N/A Debridement: Pre-procedure Verification/Time Out 13:45 N/A N/A Taken: Lidocaine 5% topical ointment N/A N/A Pain Control: Subcutaneous N/A N/A Tissue Debrided: Skin/Subcutaneous Tissue N/A N/A Level: 0.14 N/A N/A Debridement A (sq cm): rea Curette N/A  N/A Instrument: Minimum N/A N/A Bleeding: Silver Nitrate N/A N/A Hemostasis A chieved: Procedure was tolerated well N/A N/A Debridement Treatment Response: 0.3x0.6x0.2 N/A N/A Post Debridement Measurements L x W x D (cm) 0.028 N/A N/A Post Debridement Volume: (cm) Debridement N/A N/A Procedures Performed: Treatment Notes Electronic Signature(s) Signed: 02/18/2023 4:52:49 PM By: [name] MD [name] ([id number]) By: [name] MD [phone].pdf Page 5 of 10 Signed: 02/18/2023 4:52:49 PM Entered By: [name] on 02/18/2023 14:19:44 -------------------------------------------------------------------------------- Multi-Disciplinary Care Plan Details Patient Name: Date of Service: TO [name] 02/18/2023 12:30 PM Medical Record Number: [id number] Patient Account Number: [id number] Date of Birth/Sex: Treating  RN: [birth year] (82 y.o. Cline Cools Primary Care Youcef Klas: Guerry Bruin Other Clinician: Referring Raquelle Pietro: Treating Heriberto Stmartin/Extender: Wyline Beady in Treatment: 0 Active Inactive Wound/Skin Impairment Nursing Diagnoses: Impaired tissue integrity Knowledge deficit related to ulceration/compromised skin integrity Goals: Patient/caregiver will verbalize understanding of skin care regimen Date Initiated: 02/18/2023 Target Resolution Date: 04/04/2023 Goal Status: Active Ulcer/skin breakdown will have a volume reduction of 30% by week 4 Date Initiated: 02/18/2023 Target Resolution Date: 03/18/2023 Goal Status: Active Interventions: Assess patient/caregiver ability to obtain necessary supplies Assess patient/caregiver ability to perform ulcer/skin care regimen upon admission and as needed Assess ulceration(s) every visit Provide education on ulcer and skin care Notes: Electronic Signature(s) Signed: 02/19/2023 2:21:02 PM By: Redmond Pulling RN, BSN Entered By: Redmond Pulling on 02/18/2023 13:54:38 -------------------------------------------------------------------------------- Pain Assessment Details Patient Name: Date of Service: TO [name] 02/18/2023 12:30 PM Medical Record Number: [id number] Patient Account Number: [id number] Date of Birth/Sex: Treating RN: 05-14-1941 (82 y.o. Cline Cools Primary Care Celenia Hruska: Guerry Bruin Other Clinician: Referring Dyna Figuereo: Treating Rj Pedrosa/Extender: Wyline Beady in Treatment: 0 Active Problems Location of Pain Severity and Description of Pain Patient Has Paino Yes Site Locations Duration of the Pain. SAVIER, KADLEC ([id number]) [id number] Page 6 of 10 Duration of the Pain. Constant / Intermittento Intermittent Rate the pain. Current Pain Level: 5 Pain Management and Medication Current Pain Management: Electronic  Signature(s) Signed: 02/19/2023 2:21:02 PM By: Redmond Pulling RN, BSN Entered By: Redmond Pulling on 02/18/2023 13:23:36 -------------------------------------------------------------------------------- Patient/Caregiver Education Details Patient Name: Date of Service: TO [name] 9/24/2024andnbsp12:30 PM Medical Record Number: [id number] Patient Account Number: [id number] Date of Birth/Gender: Treating RN: [birth year] (82 y.o. Cline Cools Primary Care Physician: Guerry Bruin Other Clinician: Referring Physician: Treating Physician/Extender: Wyline Beady in Treatment: 0 Education Assessment Education Provided To: Patient Education Topics Provided Wound/Skin Impairment: Methods: Explain/Verbal Responses: State content correctly Electronic Signature(s) Signed: 02/19/2023 2:21:02 PM By: Redmond Pulling RN, BSN Entered By: Redmond Pulling on 02/18/2023 13:54:53 -------------------------------------------------------------------------------- Wound Assessment Details Patient Name: Date of Service: TO [name] 02/18/2023 12:30 PM Medical Record Number: [id number] Patient Account Number: [id number] Date of Birth/Sex: Treating RN: [birth year] (82 y.o. Cline Cools Primary Care Eriverto Byrnes: Guerry Bruin Other Clinician: Referring Godwin Tedesco: Treating Romanda Turrubiates/Extender: Tyrann, Gruett ([id number]) [id number] Page 7 of 10 Weeks in Treatment: 0 Wound Status Wound Number: 1 Primary Etiology: Diabetic Wound/Ulcer of the Lower Extremity Wound Location: Right Calcaneus Wound Status: Open Wounding Event: Gradually Appeared Comorbid History: Anemia, Hypertension, Type II Diabetes Date Acquired: 01/21/2023 Weeks Of Treatment: 0 Clustered Wound: No Photos Wound Measurements Length: (cm) 0.3 Width: (cm) 0.6 Depth: (cm) 0.2 Area: (cm) 0.141 Volume: (cm) 0.028 % Reduction in Area: %  Reduction in Volume: Epithelialization: None Tunneling:

## 2023-02-25 ENCOUNTER — Encounter (HOSPITAL_BASED_OUTPATIENT_CLINIC_OR_DEPARTMENT_OTHER): Payer: PPO | Attending: Internal Medicine | Admitting: Internal Medicine

## 2023-02-25 DIAGNOSIS — L97418 Non-pressure chronic ulcer of right heel and midfoot with other specified severity: Secondary | ICD-10-CM | POA: Insufficient documentation

## 2023-02-25 DIAGNOSIS — I509 Heart failure, unspecified: Secondary | ICD-10-CM | POA: Diagnosis not present

## 2023-02-25 DIAGNOSIS — L97811 Non-pressure chronic ulcer of other part of right lower leg limited to breakdown of skin: Secondary | ICD-10-CM | POA: Insufficient documentation

## 2023-02-25 DIAGNOSIS — E7800 Pure hypercholesterolemia, unspecified: Secondary | ICD-10-CM | POA: Diagnosis not present

## 2023-02-25 DIAGNOSIS — I480 Paroxysmal atrial fibrillation: Secondary | ICD-10-CM | POA: Diagnosis not present

## 2023-02-25 DIAGNOSIS — I110 Hypertensive heart disease with heart failure: Secondary | ICD-10-CM | POA: Insufficient documentation

## 2023-02-25 DIAGNOSIS — E11621 Type 2 diabetes mellitus with foot ulcer: Secondary | ICD-10-CM | POA: Insufficient documentation

## 2023-02-25 DIAGNOSIS — E1151 Type 2 diabetes mellitus with diabetic peripheral angiopathy without gangrene: Secondary | ICD-10-CM | POA: Insufficient documentation

## 2023-02-25 NOTE — Progress Notes (Signed)
plastic bag) or a cast cover and may then take shower. - purchase cast protector from [facility], [facility] or [facility] Edema Control - Lymphedema / SCD / Other Bilateral Lower Extremities Elevate legs to the level of the heart or above for 30 minutes daily and/or when sitting for 3-4 times a day throughout the day. Avoid standing for long periods of time. Wound Treatment Wound #1 - Calcaneus Wound Laterality: Right Cleanser: Soap and Water 1 x Per Week/30 Days Discharge Instructions: May shower and wash wound with dial antibacterial soap and water prior to dressing change. Cleanser: Vashe 5.8 (oz) 1 x Per Week/30 Days Discharge Instructions: Cleanse the wound with Vashe prior to applying a clean dressing using gauze sponges, not tissue or cotton balls. Peri-Wound Care: Triamcinolone 15 (g) 1 x Per Week/30 Days Discharge Instructions: Use triamcinolone 15 (g) as directed Peri-Wound Care: Sween Lotion (Moisturizing lotion) 1 x Per Week/30 Days Discharge Instructions: Apply moisturizing lotion as directed Prim Dressing: Hydrofera Blue Ready Transfer Foam, 2.5x2.5 (in/in) 1 x Per Week/30 Days ary Discharge Instructions: Apply directly to wound bed as directed Secondary Dressing: Woven Gauze Sponge, Non-Sterile 4x4 in 1 x Per Week/30 Days Discharge Instructions: Apply over primary dressing as directed. Secured With: Coban Self-Adherent Wrap 4x5 (in/yd) 1 x Per Week/30 Days Discharge Instructions: Secure with Coban as directed. Secured With: [employer], 4.5x3.1 (in/yd) 1 x Per Week/30 Days Discharge Instructions: Secure with Kerlix as directed. Wound #2 - Lower Leg Wound Laterality: Right, Anterior Cleanser: Soap and Water 1 x Per Week/30 Days Discharge  Instructions: May shower and wash wound with dial antibacterial soap and water prior to dressing change. Cleanser: Vashe 5.8 (oz) 1 x Per Week/30 Days Discharge Instructions: Cleanse the wound with Vashe prior to applying a clean dressing using gauze sponges, not tissue or cotton balls. Peri-Wound Care: Triamcinolone 15 (g) 1 x Per Week/30 Days Discharge Instructions: Use triamcinolone 15 (g) as directed Peri-Wound Care: Sween Lotion (Moisturizing lotion) 1 x Per Week/30 Days Discharge Instructions: Apply moisturizing lotion as directed Prim Dressing: Hydrofera Blue Ready Transfer Foam, 2.5x2.5 (in/in) ary 1 x Per Week/30 Days DENO, SIDA ([id number]) [phone].pdf Page 3 of 6 Discharge Instructions: Apply directly to wound bed as directed Secondary Dressing: Woven Gauze Sponge, Non-Sterile 4x4 in 1 x Per Week/30 Days Discharge Instructions: Apply over primary dressing as directed. Secured With: Coban Self-Adherent Wrap 4x5 (in/yd) 1 x Per Week/30 Days Discharge Instructions: Secure with Coban as directed. Secured With: [employer], 4.5x3.1 (in/yd) 1 x Per Week/30 Days Discharge Instructions: Secure with Kerlix as directed. Electronic Signature(s) Signed: 02/25/2023 3:27:15 PM By: Fonnie Mu RN Signed: 02/25/2023 4:26:34 PM By: [name] MD Entered By: Fonnie Mu on 02/25/2023 14:05:09 -------------------------------------------------------------------------------- Problem List Details Patient Name: Date of Service: [name] 02/25/2023 1:30 PM Medical Record Number: [id number] Patient Account Number: [id number] Date of Birth/Sex: Treating RN: 12-28-1940 (82 y.o. 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 - purchase cast protector from [facility], [facility] or [facility] Edema Control - Lymphedema / SCD / Other Bilateral Lower Extremities Elevate legs to the level of the heart or above for 30 minutes daily and/or when sitting for 3-4 times a day throughout the day. Avoid standing for long periods of time. Wound Treatment Wound #1 - Calcaneus Wound Laterality: Right Cleanser: Soap and Water 1 x Per Week/30 Days Discharge Instructions: May shower and wash wound with dial antibacterial soap and water prior to dressing change. Cleanser: Vashe 5.8 (oz) 1 x Per Week/30 Days Discharge Instructions: Cleanse the wound with Vashe prior to applying a clean dressing using gauze sponges, not tissue or cotton balls. Peri-Wound Care: Triamcinolone 15 (g) 1 x Per Week/30 Days Discharge Instructions: Use triamcinolone 15 (g) as directed Peri-Wound Care: Sween Lotion (Moisturizing lotion) 1 x Per Week/30 Days Discharge Instructions: Apply moisturizing lotion as directed Prim Dressing: Hydrofera Blue Ready Transfer Foam, 2.5x2.5 (in/in) 1 x Per Week/30 Days ary Discharge Instructions: Apply directly to wound bed as directed Secondary Dressing: Woven Gauze Sponge, Non-Sterile 4x4 in 1 x Per Week/30 Days Discharge Instructions: Apply over primary dressing as directed. Secured With: Coban Self-Adherent Wrap 4x5 (in/yd) 1 x Per Week/30 Days Discharge Instructions: Secure with Coban as directed. Secured With: [employer], 4.5x3.1 (in/yd) 1 x Per Week/30 Days Discharge Instructions: Secure with Kerlix as directed. Wound #2 - Lower Leg Wound Laterality: Right, Anterior Cleanser: Soap and Water 1 x Per Week/30 Days Discharge  Instructions: May shower and wash wound with dial antibacterial soap and water prior to dressing change. Cleanser: Vashe 5.8 (oz) 1 x Per Week/30 Days Discharge Instructions: Cleanse the wound with Vashe prior to applying a clean dressing using gauze sponges, not tissue or cotton balls. Peri-Wound Care: Triamcinolone 15 (g) 1 x Per Week/30 Days Discharge Instructions: Use triamcinolone 15 (g) as directed Peri-Wound Care: Sween Lotion (Moisturizing lotion) 1 x Per Week/30 Days Discharge Instructions: Apply moisturizing lotion as directed Prim Dressing: Hydrofera Blue Ready Transfer Foam, 2.5x2.5 (in/in) ary 1 x Per Week/30 Days DENO, SIDA ([id number]) [phone].pdf Page 3 of 6 Discharge Instructions: Apply directly to wound bed as directed Secondary Dressing: Woven Gauze Sponge, Non-Sterile 4x4 in 1 x Per Week/30 Days Discharge Instructions: Apply over primary dressing as directed. Secured With: Coban Self-Adherent Wrap 4x5 (in/yd) 1 x Per Week/30 Days Discharge Instructions: Secure with Coban as directed. Secured With: [employer], 4.5x3.1 (in/yd) 1 x Per Week/30 Days Discharge Instructions: Secure with Kerlix as directed. Electronic Signature(s) Signed: 02/25/2023 3:27:15 PM By: Fonnie Mu RN Signed: 02/25/2023 4:26:34 PM By: [name] MD Entered By: Fonnie Mu on 02/25/2023 14:05:09 -------------------------------------------------------------------------------- Problem List Details Patient Name: Date of Service: [name] 02/25/2023 1:30 PM Medical Record Number: [id number] Patient Account Number: [id number] Date of Birth/Sex: Treating RN: 12-28-1940 (82 y.o. 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 02/25/2023 1:30 PM Medical Record Number: [id number] Patient Account Number: [id number] Date of Birth/Sex: Treating RN: Apr 11, 1941 (82 y.o. M) Primary Care Provider: Guerry Bruin Other Clinician: Referring Provider: Treating Provider/Extender: Wyline Beady in Treatment: 1 Subjective History of Present Illness (HPI) ADMISSION 02/18/2023 This is an 82 year old still very active man who was referred to [place] by Dr. Ardelle Anton at [facility]. He has a nonhealing wound on the plantar aspect of his right heel. He has been dealing with this since June when he presented to Dr. Loreta Ave with a painful area on the right mid heel. He was not really sure how this happened. He ultimately had an MRI in July that showed no osteomyelitis but there was a small abscess he was treated with antibiotics doxycycline and gradually the pain improved but he still been left with a small open area. He had an ABI on 11/14/2022 that was 1.08 on the right and 0.99 on the left. He by enlarge has been using Silvadene on the wound. Recently changed to silver alginate. He also developed a blister on the right anterior  lower leg. Recent x-rays have not shown any cortical changes. Also in June he had a sedimentation rate of 2 and a C-reactive protein of 14 not suggestive of chronic active infection Past medical history includes type 2 diabetes, nonischemic cardiomyopathy, hypertension cholesterolemia, skin cancer, combined congestive heart failure, paroxysmal A-fib, idiopathic trigeminal neuralgia. 10/1; ulcer on the plantar aspect of his right heel. He is being compliant with the healing boot. We have been using Hydrofera Blue heel cup gauze. Dimensions are better Objective Constitutional Patient is hypertensive.. Pulse regular and within target range for patient.[name] Respirations regular, non-labored and within target range.. Temperature is normal and within the target range for the patient.[name] Appears in no distress. Vitals Time Taken: 1:33 PM, Temperature: 98.4 F, Pulse: 86 bpm, Respiratory Rate: 17 breaths/min, Blood Pressure: 160/65 mmHg. General Notes: Wound exam; plantar aspect of the right heel. Small punched-out area with 0.3 cm of circumferential undermining. What I can see of the base of the wound looks healthy. No evidence of surrounding infection Integumentary (Hair, Skin) Wound #1 status is Open. Original cause of wound was Gradually Appeared. The date acquired was: 01/21/2023. The wound has been in treatment 1 weeks. The wound is located on the Right Calcaneus. The wound measures 0.3cm length x 0.4cm width x 0.3cm depth; 0.094cm^2 area and 0.028cm^3 volume. There is Fat Layer (Subcutaneous Tissue) exposed. There is no tunneling noted, however, there is undermining starting at 12:00 and ending at 12:00 with a maximum distance of 0.3cm. There is a medium amount of serosanguineous drainage noted. There is large (67-100%) red granulation within the wound bed. There is a small (1-33%) amount of necrotic tissue within the wound bed including Adherent Slough. The periwound skin appearance exhibited: Callus. The  periwound skin appearance did not exhibit: Crepitus, Excoriation, Induration, Rash, Scarring, Dry/Scaly, Maceration, Atrophie Blanche, Cyanosis, Ecchymosis, Hemosiderin Staining, Mottled, Pallor, Rubor, Erythema. Periwound temperature was noted as No Abnormality. The periwound has tenderness on palpation. Wound #2 status is Open. Original cause of wound was Blister. The date acquired was: 02/04/2023. The wound has been in treatment 1 weeks. The wound is located on the Right,Anterior Lower Leg. The wound measures 1cm  plastic bag) or a cast cover and may then take shower. - purchase cast protector from [facility], [facility] or [facility] Edema Control - Lymphedema / SCD / Other Bilateral Lower Extremities Elevate legs to the level of the heart or above for 30 minutes daily and/or when sitting for 3-4 times a day throughout the day. Avoid standing for long periods of time. Wound Treatment Wound #1 - Calcaneus Wound Laterality: Right Cleanser: Soap and Water 1 x Per Week/30 Days Discharge Instructions: May shower and wash wound with dial antibacterial soap and water prior to dressing change. Cleanser: Vashe 5.8 (oz) 1 x Per Week/30 Days Discharge Instructions: Cleanse the wound with Vashe prior to applying a clean dressing using gauze sponges, not tissue or cotton balls. Peri-Wound Care: Triamcinolone 15 (g) 1 x Per Week/30 Days Discharge Instructions: Use triamcinolone 15 (g) as directed Peri-Wound Care: Sween Lotion (Moisturizing lotion) 1 x Per Week/30 Days Discharge Instructions: Apply moisturizing lotion as directed Prim Dressing: Hydrofera Blue Ready Transfer Foam, 2.5x2.5 (in/in) 1 x Per Week/30 Days ary Discharge Instructions: Apply directly to wound bed as directed Secondary Dressing: Woven Gauze Sponge, Non-Sterile 4x4 in 1 x Per Week/30 Days Discharge Instructions: Apply over primary dressing as directed. Secured With: Coban Self-Adherent Wrap 4x5 (in/yd) 1 x Per Week/30 Days Discharge Instructions: Secure with Coban as directed. Secured With: [employer], 4.5x3.1 (in/yd) 1 x Per Week/30 Days Discharge Instructions: Secure with Kerlix as directed. Wound #2 - Lower Leg Wound Laterality: Right, Anterior Cleanser: Soap and Water 1 x Per Week/30 Days Discharge  Instructions: May shower and wash wound with dial antibacterial soap and water prior to dressing change. Cleanser: Vashe 5.8 (oz) 1 x Per Week/30 Days Discharge Instructions: Cleanse the wound with Vashe prior to applying a clean dressing using gauze sponges, not tissue or cotton balls. Peri-Wound Care: Triamcinolone 15 (g) 1 x Per Week/30 Days Discharge Instructions: Use triamcinolone 15 (g) as directed Peri-Wound Care: Sween Lotion (Moisturizing lotion) 1 x Per Week/30 Days Discharge Instructions: Apply moisturizing lotion as directed Prim Dressing: Hydrofera Blue Ready Transfer Foam, 2.5x2.5 (in/in) ary 1 x Per Week/30 Days DENO, SIDA ([id number]) [phone].pdf Page 3 of 6 Discharge Instructions: Apply directly to wound bed as directed Secondary Dressing: Woven Gauze Sponge, Non-Sterile 4x4 in 1 x Per Week/30 Days Discharge Instructions: Apply over primary dressing as directed. Secured With: Coban Self-Adherent Wrap 4x5 (in/yd) 1 x Per Week/30 Days Discharge Instructions: Secure with Coban as directed. Secured With: [employer], 4.5x3.1 (in/yd) 1 x Per Week/30 Days Discharge Instructions: Secure with Kerlix as directed. Electronic Signature(s) Signed: 02/25/2023 3:27:15 PM By: Fonnie Mu RN Signed: 02/25/2023 4:26:34 PM By: [name] MD Entered By: Fonnie Mu on 02/25/2023 14:05:09 -------------------------------------------------------------------------------- Problem List Details Patient Name: Date of Service: [name] 02/25/2023 1:30 PM Medical Record Number: [id number] Patient Account Number: [id number] Date of Birth/Sex: Treating RN: 12-28-1940 (82 y.o. 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 Secured With: [employer], 4.5x3.1 (in/yd) 1 x Per Week/30 Days Discharge Instructions: Secure with Kerlix as directed. 1. Second visit for this patient. There is improvement in his wound dimension and condition of the wound bed. 2. I am uncertain about whether the improvement in dimensions is related to the debridement I did last week however for now I am going to monitor this for another week 3. He is compliant with the heel offloading boot I told him that this was paramount to improving the wound and preventing [place] having to use a total contact cast. 4. Same dressing Hydrofera Blue, heel cup, gauze and Coban adhesive 5. The secondary wound on the right leg also received Hydrofera Blue. This also seems better this week Electronic Signature(s) Signed: 02/25/2023 4:26:34 PM By: [name] MD Entered By: [name] on 02/25/2023 14:18:07 -------------------------------------------------------------------------------- SuperBill Details Patient Name: Date of Service: [name] 02/25/2023 Medical Record Number: [id number] Patient Account Number: [id number] Date of Birth/Sex: Treating RN: April 09, 1941 (82 y.o. 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 Facility Procedures : CPT4 Code: [id number] Description: 99214 - WOUND CARE VISIT-LEV 4 EST PT Modifier: Quantity: 1 Physician Procedures : CPT4 Code Description Modifier [id number] 99213 - WC PHYS LEVEL 3 - EST PT ICD-10 Diagnosis Description E11.621 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

## 2023-02-25 NOTE — Progress Notes (Signed)
wound with dial antibacterial soap and water prior to dressing change. Vashe 5.8 (oz) Discharge Instruction: Cleanse the wound with Vashe prior to applying a clean dressing using gauze sponges, not  tissue or cotton balls. Peri-Wound Care Triamcinolone 15 (g) Discharge Instruction: Use triamcinolone 15 (g) as directed Sween Lotion (Moisturizing lotion) Discharge Instruction: Apply moisturizing lotion as directed Topical Primary Dressing Hydrofera Blue Ready Transfer Foam, 2.5x2.5 (in/in) Discharge Instruction: Apply directly to wound bed as directed Secondary Dressing Woven Gauze Sponge, Non-Sterile 4x4 in Discharge Instruction: Apply over primary dressing as directed. Secured With [employer] 4x5 (in/yd) Discharge Instruction: Secure with Coban as directed. Kerlix Roll Sterile, 4.5x3.1 (in/yd) Discharge Instruction: Secure with Kerlix as directed. Compression Wrap Compression Stockings Add-Ons Electronic Signature(s) Signed: 02/25/2023 3:27:15 PM By: Fonnie Mu RN Entered By: Fonnie Mu on 02/25/2023 13:42:25 -------------------------------------------------------------------------------- Wound Assessment Details Patient Name: Date of Service: TO Erik [name], Erik [name] W. 02/25/2023 1:30 PM Medical Record Number: [id number] Patient Account Number: [id number] Date of Birth/Sex: Treating RN: [birth year] (82 y.o. 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 Etiology: Date Acquired: 02/04/2023 Wound Open Weeks Of Treatment: 1 Status: Clustered Wound: No Comorbid Anemia, Arrhythmia, Coronary Artery Disease, Hypertension, History: Peripheral Arterial Disease, Type II Diabetes, Neuropathy Wound Measurements Length: (cm) 1 Width: (cm) 1.3 Depth: (cm) 0.1 Area:  (cm) 1.021 Volume: (cm) 0.102 % Reduction in Area: 69.1% % Reduction in Volume: 69.1% Epithelialization: Medium (34-66%) Tunneling: No Undermining: No Wound Description Classification: Full Thickness Without Exposed Support Structures Wound Margin: Distinct, outline attached Exudate Amount: Medium Exudate Type: Serosanguineous Exudate Color: red, brown Foul Odor After Cleansing: No Slough/Fibrino No Wound Bed Granulation Amount: Large (67-100%) Exposed Structure Granulation Quality: Pink Fat Layer (Subcutaneous Tissue) Exposed: Yes Necrotic Amount: None Present (0%) Periwound Skin Texture Texture Color No Abnormalities Noted: No No Abnormalities Noted: No Moisture No Abnormalities Noted: No Treatment Notes Wound #2 (Lower Leg) Wound Laterality: Right, Anterior Cleanser Soap and Water Discharge Instruction: May shower and wash wound with dial antibacterial soap and water prior to dressing change. Vashe 5.8 (oz) Discharge Instruction: Cleanse the wound with Vashe prior to applying a clean dressing using gauze sponges, not tissue or cotton balls. Peri-Wound Care Triamcinolone 15 (g) Discharge Instruction: Use triamcinolone 15 (g) as directed Sween Lotion (Moisturizing lotion) Discharge Instruction: Apply moisturizing lotion as directed Topical Primary Dressing Hydrofera Blue Ready Transfer Foam, 2.5x2.5 (in/in) Discharge Instruction: Apply directly to wound bed as directed Secondary Dressing Woven Gauze Sponge, Non-Sterile 4x4 in Discharge Instruction: Apply over primary dressing as directed. Secured With [employer] 4x5 (in/yd) Discharge Instruction: Secure with Coban as directed. Kerlix Roll Sterile, 4.5x3.1 (in/yd) Discharge Instruction: Secure with Kerlix as directed. Compression Wrap Compression Stockings Add-Ons Electronic Signature(s) Signed: 02/25/2023 3:27:15 PM By: Fonnie Mu RN Entered By: Fonnie Mu on 02/25/2023  13:42:42 [name] ([id number]) [id number] Page 9 of 9 -------------------------------------------------------------------------------- Vitals Details Patient Name: Date of Service: TO Erik [name]. 02/25/2023 1:30 PM Medical Record Number: [id number] Patient Account Number: [id number] Date of Birth/Sex: Treating RN: [birth year] (82 y.o. Erik [name] Primary Care Maraki Macquarrie: Guerry Bruin Other Clinician: Referring Ericah Scotto: Treating Irva Loser/Extender: Wyline Beady in Treatment: 1 Vital Signs Time Taken:  Cleansing / Measurement []  - 0 Simple Wound Cleansing - one wound X- 2 5 Complex Wound Cleansing - multiple wounds X- 1 5 Wound Imaging (photographs - any number of wounds) []  - 0 Wound Tracing (instead of photographs) []  - 0 Simple Wound Measurement - one wound X- 2 5 Complex Wound Measurement - multiple wounds INTERVENTIONS - Wound Dressings []  - 0 Small Wound Dressing one or multiple wounds X- 2 15 Medium Wound Dressing one or multiple wounds []  - 0 Large Wound Dressing one or multiple wounds X- 1 5 Application of Medications - topical []  - 0 Application of Medications - injection INTERVENTIONS - Miscellaneous []  - 0 External ear exam []  - 0 Specimen Collection (cultures, biopsies, blood, body fluids, etc.) []  - 0 Specimen(s) / Culture(s) sent or taken to Lab for analysis []  - 0 Patient Transfer (multiple staff / Nurse, adult / Similar devices) []  - 0 Simple Staple / Suture removal (25 or less) []  - 0 Complex Staple / Suture removal (26 or more) []  - 0 Hypo / Hyperglycemic Management (close monitor of Blood Glucose) [name] ([id number]) [id number] Page 3 of 9 []  - 0 Ankle / Brachial Index (ABI) - do not check if billed separately X- 1 5 Vital Signs Has the patient been seen at the hospital within the last three years: Yes Total Score: 130 Level Of Care: New/Established - Level 4 Electronic Signature(s) Signed: 02/25/2023 3:27:15 PM By: Fonnie Mu RN Entered By: Fonnie Mu on 02/25/2023 14:15:34 -------------------------------------------------------------------------------- Encounter Discharge Information Details Patient Name: Date of Service: TO Erik [name], Erik [name] W.  02/25/2023 1:30 PM Medical Record Number: [id number] Patient Account Number: [id number] Date of Birth/Sex: Treating RN: August 25, 1940 (82 y.o. Erik [name] Primary Care Marguita Venning: Guerry Bruin Other Clinician: Referring Dahir Ayer: Treating Apurva Reily/Extender: Wyline Beady in Treatment: 1 Encounter Discharge Information Items Discharge Condition: Stable Ambulatory Status: Ambulatory Discharge Destination: Home Transportation: Private Auto Accompanied By: self Schedule Follow-up Appointment: Yes Clinical Summary of Care: Patient Declined Electronic Signature(s) Signed: 02/25/2023 3:27:15 PM By: Fonnie Mu RN Entered By: Fonnie Mu on 02/25/2023 14:16:03 -------------------------------------------------------------------------------- Lower Extremity Assessment Details Patient Name: Date of Service: TO [name] 02/25/2023 1:30 PM Medical Record Number: [id number] Patient Account Number: [id number] Date of Birth/Sex: Treating RN: November 19, 1940 (82 y.o. Erik [name] Primary Care Ailish Prospero: Guerry Bruin Other Clinician: Referring Merit Gadsby: Treating Elania Crowl/Extender: Wyline Beady in Treatment: 1 Edema Assessment Assessed: [name]: No] [name]: Yes] [Left: Edema] [Right: :] Calf Left: Right: Point of Measurement: From Medial Instep 41 cm Ankle Left: Right: Point of Measurement: From Medial Instep 27.5 cm Vascular Assessment Left: [id number] Page 4 of 9Right:] Pulses: Dorsalis Pedis Palpable: [id number] Page 4 of 9Yes] Posterior Tibial Palpable: [[id number] Page 4 of 9Yes] Extremity colors, hair growth, and conditions: Temperature of Extremity: [[id number] Page 4 of 9Warm] Toe Nail Assessment Left: Right: Thick: Yes Discolored: Yes Deformed: Yes Improper Length and Hygiene:  Yes Electronic Signature(s) Signed: 02/25/2023 3:27:15 PM By: Fonnie Mu RN Entered By: Fonnie Mu on 02/25/2023 13:34:15 -------------------------------------------------------------------------------- Multi-Disciplinary Care Plan Details Patient Name: Date of Service: TO Erik [name], Erik [name] W. 02/25/2023 1:30 PM Medical Record Number: [id number] Patient Account Number: [id number] Date of Birth/Sex: Treating RN: 01/02/1941 (82 y.o. Erik [name] Primary Care Jeanifer Halliday: Guerry Bruin Other Clinician: Referring Sharilyn Geisinger: Treating Sunshyne Horvath/Extender: Wyline Beady in Treatment: 1 Active Inactive Wound/Skin Impairment Nursing Diagnoses: Impaired tissue integrity Knowledge deficit related to ulceration/compromised skin integrity Goals: Patient/caregiver  CLANCEY, WELTON ([id number]) [id number] Page 1 of 9 Visit Report for 02/25/2023 Arrival Information Details Patient Name: Date of Service: TO Erik [name]. 02/25/2023 1:30 PM Medical Record Number: [id number] Patient Account Number: [id number] Date of Birth/Sex: Treating RN: 1940-08-26 (82 y.o. Charlean Merl, Lauren Primary Care Jaramiah Bossard: Guerry Bruin Other Clinician: Referring Philip Kotlyar: Treating Ashaun Gaughan/Extender: Wyline Beady in Treatment: 1 Visit Information History Since Last Visit Added or deleted any medications: No Patient Arrived: Ambulatory Any new allergies or adverse reactions: No Arrival Time: 13:32 Had a fall or experienced change in No Accompanied By: self activities of daily living that may affect Transfer Assistance: Manual risk of falls: Patient Identification Verified: Yes Signs or symptoms of abuse/neglect since last visito No Secondary Verification Process Completed: Yes Hospitalized since last visit: No Patient Requires Transmission-Based Precautions: No Implantable device outside of the clinic excluding No Patient Has Alerts: No cellular tissue based products placed in the center since last visit: Has Dressing in Place as Prescribed: Yes Has Compression in Place as Prescribed: Yes Pain Present Now: No Electronic Signature(s) Signed: 02/25/2023 3:27:15 PM By: Fonnie Mu RN Entered By: Fonnie Mu on 02/25/2023 13:33:22 -------------------------------------------------------------------------------- Clinic Level of Care Assessment Details Patient Name: Date of Service: TO [name] 02/25/2023 1:30 PM Medical Record Number: [id number] Patient Account Number: [id number] Date of Birth/Sex: Treating RN: 01-Jan-1941 (82 y.o. Erik [name] Primary Care Chanley Mcenery: Guerry Bruin Other Clinician: Referring Lis Savitt: Treating Tarica Harl/Extender: Wyline Beady in Treatment: 1 Clinic Level of Care Assessment Items TOOL 4 Quantity Score X- 1 0 Use when only an EandM is performed on FOLLOW-UP visit ASSESSMENTS - Nursing Assessment / Reassessment X- 1 10 Reassessment of Co-morbidities (includes updates in patient status) X- 1 5 Reassessment of Adherence to Treatment Plan ASSESSMENTS - Wound and Skin A ssessment / Reassessment []  - 0 Simple Wound Assessment / Reassessment - one wound X- 2 5 Complex Wound Assessment / Reassessment - multiple wounds []  - 0 Dermatologic / Skin Assessment (not related to wound area) ASSESSMENTS - Focused Assessment []  - 0 Circumferential Edema Measurements - multi extremities []  - 0 Nutritional Assessment / Counseling / Intervention MALICHI, PALARDY ([id number]) [id number] Page 2 of 9 []  - 0 Lower Extremity Assessment (monofilament, tuning fork, pulses) []  - 0 Peripheral Arterial Disease Assessment (using hand held doppler) ASSESSMENTS - Ostomy and/or Continence Assessment and Care []  - 0 Incontinence Assessment and Management []  - 0 Ostomy Care Assessment and Management (repouching, etc.) PROCESS - Coordination of Care []  - 0 Simple Patient / Family Education for ongoing care X- 1 20 Complex (extensive) Patient / Family Education for ongoing care X- 1 10 Staff obtains Chiropractor, Records, T Results / Process Orders est []  - 0 Staff telephones HHA, Nursing Homes / Clarify orders / etc []  - 0 Routine Transfer to another Facility (non-emergent condition) []  - 0 Routine Hospital Admission (non-emergent condition) []  - 0 New Admissions / Manufacturing engineer / Ordering NPWT Apligraf, etc. , []  - 0 Emergency Hospital Admission (emergent condition) X- 1 10 Simple Discharge Coordination []  - 0 Complex (extensive) Discharge Coordination PROCESS - Special Needs []  - 0 Pediatric / Minor Patient Management []  - 0 Isolation Patient Management []  - 0 Hearing  / Language / Visual special needs []  - 0 Assessment of Community assistance (transportation, D/C planning, etc.) []  - 0 Additional assistance / Altered mentation []  - 0 Support Surface(s) Assessment (bed, cushion, seat, etc.) INTERVENTIONS - Wound  wound with dial antibacterial soap and water prior to dressing change. Vashe 5.8 (oz) Discharge Instruction: Cleanse the wound with Vashe prior to applying a clean dressing using gauze sponges, not  tissue or cotton balls. Peri-Wound Care Triamcinolone 15 (g) Discharge Instruction: Use triamcinolone 15 (g) as directed Sween Lotion (Moisturizing lotion) Discharge Instruction: Apply moisturizing lotion as directed Topical Primary Dressing Hydrofera Blue Ready Transfer Foam, 2.5x2.5 (in/in) Discharge Instruction: Apply directly to wound bed as directed Secondary Dressing Woven Gauze Sponge, Non-Sterile 4x4 in Discharge Instruction: Apply over primary dressing as directed. Secured With [employer] 4x5 (in/yd) Discharge Instruction: Secure with Coban as directed. Kerlix Roll Sterile, 4.5x3.1 (in/yd) Discharge Instruction: Secure with Kerlix as directed. Compression Wrap Compression Stockings Add-Ons Electronic Signature(s) Signed: 02/25/2023 3:27:15 PM By: Fonnie Mu RN Entered By: Fonnie Mu on 02/25/2023 13:42:25 -------------------------------------------------------------------------------- Wound Assessment Details Patient Name: Date of Service: TO Erik [name], Erik [name] W. 02/25/2023 1:30 PM Medical Record Number: [id number] Patient Account Number: [id number] Date of Birth/Sex: Treating RN: [birth year] (82 y.o. 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 Etiology: Date Acquired: 02/04/2023 Wound Open Weeks Of Treatment: 1 Status: Clustered Wound: No Comorbid Anemia, Arrhythmia, Coronary Artery Disease, Hypertension, History: Peripheral Arterial Disease, Type II Diabetes, Neuropathy Wound Measurements Length: (cm) 1 Width: (cm) 1.3 Depth: (cm) 0.1 Area:  (cm) 1.021 Volume: (cm) 0.102 % Reduction in Area: 69.1% % Reduction in Volume: 69.1% Epithelialization: Medium (34-66%) Tunneling: No Undermining: No Wound Description Classification: Full Thickness Without Exposed Support Structures Wound Margin: Distinct, outline attached Exudate Amount: Medium Exudate Type: Serosanguineous Exudate Color: red, brown Foul Odor After Cleansing: No Slough/Fibrino No Wound Bed Granulation Amount: Large (67-100%) Exposed Structure Granulation Quality: Pink Fat Layer (Subcutaneous Tissue) Exposed: Yes Necrotic Amount: None Present (0%) Periwound Skin Texture Texture Color No Abnormalities Noted: No No Abnormalities Noted: No Moisture No Abnormalities Noted: No Treatment Notes Wound #2 (Lower Leg) Wound Laterality: Right, Anterior Cleanser Soap and Water Discharge Instruction: May shower and wash wound with dial antibacterial soap and water prior to dressing change. Vashe 5.8 (oz) Discharge Instruction: Cleanse the wound with Vashe prior to applying a clean dressing using gauze sponges, not tissue or cotton balls. Peri-Wound Care Triamcinolone 15 (g) Discharge Instruction: Use triamcinolone 15 (g) as directed Sween Lotion (Moisturizing lotion) Discharge Instruction: Apply moisturizing lotion as directed Topical Primary Dressing Hydrofera Blue Ready Transfer Foam, 2.5x2.5 (in/in) Discharge Instruction: Apply directly to wound bed as directed Secondary Dressing Woven Gauze Sponge, Non-Sterile 4x4 in Discharge Instruction: Apply over primary dressing as directed. Secured With [employer] 4x5 (in/yd) Discharge Instruction: Secure with Coban as directed. Kerlix Roll Sterile, 4.5x3.1 (in/yd) Discharge Instruction: Secure with Kerlix as directed. Compression Wrap Compression Stockings Add-Ons Electronic Signature(s) Signed: 02/25/2023 3:27:15 PM By: Fonnie Mu RN Entered By: Fonnie Mu on 02/25/2023  13:42:42 [name] ([id number]) [id number] Page 9 of 9 -------------------------------------------------------------------------------- Vitals Details Patient Name: Date of Service: TO Erik [name]. 02/25/2023 1:30 PM Medical Record Number: [id number] Patient Account Number: [id number] Date of Birth/Sex: Treating RN: [birth year] (82 y.o. Erik [name] Primary Care Maraki Macquarrie: Guerry Bruin Other Clinician: Referring Ericah Scotto: Treating Irva Loser/Extender: Wyline Beady in Treatment: 1 Vital Signs Time Taken:  CLANCEY, WELTON ([id number]) [id number] Page 1 of 9 Visit Report for 02/25/2023 Arrival Information Details Patient Name: Date of Service: TO Erik [name]. 02/25/2023 1:30 PM Medical Record Number: [id number] Patient Account Number: [id number] Date of Birth/Sex: Treating RN: 1940-08-26 (82 y.o. Charlean Merl, Lauren Primary Care Jaramiah Bossard: Guerry Bruin Other Clinician: Referring Philip Kotlyar: Treating Ashaun Gaughan/Extender: Wyline Beady in Treatment: 1 Visit Information History Since Last Visit Added or deleted any medications: No Patient Arrived: Ambulatory Any new allergies or adverse reactions: No Arrival Time: 13:32 Had a fall or experienced change in No Accompanied By: self activities of daily living that may affect Transfer Assistance: Manual risk of falls: Patient Identification Verified: Yes Signs or symptoms of abuse/neglect since last visito No Secondary Verification Process Completed: Yes Hospitalized since last visit: No Patient Requires Transmission-Based Precautions: No Implantable device outside of the clinic excluding No Patient Has Alerts: No cellular tissue based products placed in the center since last visit: Has Dressing in Place as Prescribed: Yes Has Compression in Place as Prescribed: Yes Pain Present Now: No Electronic Signature(s) Signed: 02/25/2023 3:27:15 PM By: Fonnie Mu RN Entered By: Fonnie Mu on 02/25/2023 13:33:22 -------------------------------------------------------------------------------- Clinic Level of Care Assessment Details Patient Name: Date of Service: TO [name] 02/25/2023 1:30 PM Medical Record Number: [id number] Patient Account Number: [id number] Date of Birth/Sex: Treating RN: 01-Jan-1941 (82 y.o. Erik [name] Primary Care Chanley Mcenery: Guerry Bruin Other Clinician: Referring Lis Savitt: Treating Tarica Harl/Extender: Wyline Beady in Treatment: 1 Clinic Level of Care Assessment Items TOOL 4 Quantity Score X- 1 0 Use when only an EandM is performed on FOLLOW-UP visit ASSESSMENTS - Nursing Assessment / Reassessment X- 1 10 Reassessment of Co-morbidities (includes updates in patient status) X- 1 5 Reassessment of Adherence to Treatment Plan ASSESSMENTS - Wound and Skin A ssessment / Reassessment []  - 0 Simple Wound Assessment / Reassessment - one wound X- 2 5 Complex Wound Assessment / Reassessment - multiple wounds []  - 0 Dermatologic / Skin Assessment (not related to wound area) ASSESSMENTS - Focused Assessment []  - 0 Circumferential Edema Measurements - multi extremities []  - 0 Nutritional Assessment / Counseling / Intervention MALICHI, PALARDY ([id number]) [id number] Page 2 of 9 []  - 0 Lower Extremity Assessment (monofilament, tuning fork, pulses) []  - 0 Peripheral Arterial Disease Assessment (using hand held doppler) ASSESSMENTS - Ostomy and/or Continence Assessment and Care []  - 0 Incontinence Assessment and Management []  - 0 Ostomy Care Assessment and Management (repouching, etc.) PROCESS - Coordination of Care []  - 0 Simple Patient / Family Education for ongoing care X- 1 20 Complex (extensive) Patient / Family Education for ongoing care X- 1 10 Staff obtains Chiropractor, Records, T Results / Process Orders est []  - 0 Staff telephones HHA, Nursing Homes / Clarify orders / etc []  - 0 Routine Transfer to another Facility (non-emergent condition) []  - 0 Routine Hospital Admission (non-emergent condition) []  - 0 New Admissions / Manufacturing engineer / Ordering NPWT Apligraf, etc. , []  - 0 Emergency Hospital Admission (emergent condition) X- 1 10 Simple Discharge Coordination []  - 0 Complex (extensive) Discharge Coordination PROCESS - Special Needs []  - 0 Pediatric / Minor Patient Management []  - 0 Isolation Patient Management []  - 0 Hearing  / Language / Visual special needs []  - 0 Assessment of Community assistance (transportation, D/C planning, etc.) []  - 0 Additional assistance / Altered mentation []  - 0 Support Surface(s) Assessment (bed, cushion, seat, etc.) INTERVENTIONS - Wound

## 2023-03-03 ENCOUNTER — Encounter (HOSPITAL_BASED_OUTPATIENT_CLINIC_OR_DEPARTMENT_OTHER): Payer: PPO | Admitting: Internal Medicine

## 2023-03-03 DIAGNOSIS — E11621 Type 2 diabetes mellitus with foot ulcer: Secondary | ICD-10-CM | POA: Diagnosis not present

## 2023-03-03 DIAGNOSIS — L97412 Non-pressure chronic ulcer of right heel and midfoot with fat layer exposed: Secondary | ICD-10-CM | POA: Diagnosis not present

## 2023-03-04 NOTE — Progress Notes (Signed)
Erik [name], Erik [name] ([id number]) [id number] Page 1 of 9 Visit Report for 03/03/2023 Arrival Information Details Patient Name: Date of Service: TO Erik [name]. 03/03/2023 12:30 PM Medical Record Number: [id number] Patient Account Number: [id number] Date of Birth/Sex: Treating RN: 1941-03-07 (82 y.o. Cline Cools Primary Care Jaquawn Saffran: Guerry Bruin Other Clinician: Referring Onofre Gains: Treating Daton Szilagyi/Extender: Wyline Beady in Treatment: 1 Visit Information History Since Last Visit Added or deleted any medications: No Patient Arrived: Ambulatory Any new allergies or adverse reactions: No Arrival Time: 12:40 Had a fall or experienced change in No Accompanied By: self activities of daily living that may affect Transfer Assistance: None risk of falls: Patient Identification Verified: Yes Signs or symptoms of abuse/neglect since last visito No Secondary Verification Process Completed: Yes Hospitalized since last visit: No Patient Requires Transmission-Based Precautions: No Implantable device outside of the clinic excluding No Patient Has Alerts: No cellular tissue based products placed in the center since last visit: Has Dressing in Place as Prescribed: Yes Has Compression in Place as Prescribed: Yes Pain Present Now: No Electronic Signature(s) Signed: 03/03/2023 5:16:54 PM By: Redmond Pulling RN, BSN Entered By: Redmond Pulling on 03/03/2023 09:41:25 -------------------------------------------------------------------------------- Encounter Discharge Information Details Patient Name: Date of Service: [name] 03/03/2023 12:30 PM Medical Record Number: [id number] Patient Account Number: [id number] Date of Birth/Sex: Treating RN: March 26, 1941 (82 y.o. Cline Cools Primary Care Ralph Brouwer: Guerry Bruin Other Clinician: Referring Jamale Spangler: Treating Franny Selvage/Extender: Wyline Beady in  Treatment: 1 Encounter Discharge Information Items Post Procedure Vitals Discharge Condition: Stable Temperature (F): 98.1 Ambulatory Status: Ambulatory Pulse (bpm): 79 Discharge Destination: Home Respiratory Rate (breaths/min): 18 Transportation: Private Auto Blood Pressure (mmHg): 171/63 Accompanied By: SELF Schedule Follow-up Appointment: Yes Clinical Summary of Care: Patient Declined Electronic Signature(s) Signed: 03/03/2023 5:16:54 PM By: Redmond Pulling RN, BSN Entered By: Redmond Pulling on 03/03/2023 10:31:58 [name] ([id number]) [id number] Page 2 of 9 -------------------------------------------------------------------------------- Lower Extremity Assessment Details Patient Name: Date of Service: TO Erik [name]. 03/03/2023 12:30 PM Medical Record Number: [id number] Patient Account Number: [id number] Date of Birth/Sex: Treating RN: Nov 21, 1940 (82 y.o. Cline Cools Primary Care Mystie Ormand: Guerry Bruin Other Clinician: Referring Lucille Crichlow: Treating Sammy Douthitt/Extender: Wyline Beady in Treatment: 1 Edema Assessment Assessed: [name]: No] [name]: No] [Left: Edema] [Right: :] Calf Left: Right: Point of Measurement: From Medial Instep 37.5 cm Ankle Left: Right: Point of Measurement: From Medial Instep 25 cm Vascular Assessment Pulses: Dorsalis Pedis Palpable: [Right:Yes] Extremity colors, hair growth, and conditions: Temperature of Extremity: [Right:Warm] Electronic Signature(s) Signed: 03/03/2023 5:16:54 PM By: Redmond Pulling RN, BSN Entered By: Redmond Pulling on 03/03/2023 09:49:31 -------------------------------------------------------------------------------- Multi Wound Chart Details Patient Name: Date of Service: [name] 03/03/2023 12:30 PM Medical Record Number: [id number] Patient Account Number: [id number] Date of Birth/Sex: Treating RN: 1940/12/26 (82 y.o. 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 N/A Primary Etiology: Extremity N/A Diabetic Wound/Ulcer of the Lower N/A Secondary Etiology: Extremity Anemia, Arrhythmia, Coronary Artery Anemia, Arrhythmia, Coronary Artery N/A Comorbid History: Disease, Hypertension, Peripheral Disease, Hypertension, Peripheral Arterial Disease, Type II Diabetes, Arterial  Cline Cools Primary Care Barabara Motz: Guerry Bruin Other Clinician: Referring Jeanpierre Thebeau: Treating Khary Schaben/Extender: Wyline Beady in Treatment: 1 Wound Status Wound Number: 2 Primary Venous Leg Ulcer Etiology: Wound Location: Right, 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 Wounding Event: Blister Etiology: Date Acquired: 02/04/2023 Wound Healed - Epithelialized Weeks Of Treatment: 1 Status: Clustered Wound: No Comorbid Anemia, Arrhythmia, Coronary Artery Disease, Hypertension, History: Peripheral Arterial Disease, Type II Diabetes, Neuropathy Photos [name], [name] ([id number]) [id number] Page 8 of 9 Wound Measurements Length: (cm) Width: (cm) Depth: (cm) Area: (cm) Volume: (cm) 0 % Reduction in Area: 100% 0 % Reduction in Volume: 100% 0 Epithelialization: Large (67-100%) 0 Tunneling: No 0 Undermining: No Wound Description Classification: Full Thickness Without Exposed Support Structures Wound Margin: Distinct, outline attached Exudate Amount: None Present Foul Odor After Cleansing: No Slough/Fibrino No Wound Bed Granulation Amount: None Present (0%) Exposed Structure Necrotic Amount: None Present (0%) Fat Layer (Subcutaneous Tissue) Exposed: No Periwound Skin Texture Texture Color No Abnormalities Noted: No No Abnormalities Noted: No Moisture No Abnormalities Noted: No Treatment Notes Wound #2 (Lower Leg) Wound Laterality: Right, Anterior Cleanser Peri-Wound Care Topical Primary Dressing Secondary Dressing Secured With Compression Wrap Compression  Stockings Add-Ons Electronic Signature(s) Signed: 03/03/2023 5:16:54 PM By: Redmond Pulling RN, BSN Entered By: Redmond Pulling on 03/03/2023 10:32:33 -------------------------------------------------------------------------------- Vitals Details Patient Name: Date of Service: [name] 03/03/2023 12:30 PM Medical Record Number: [id number] Patient Account Number: [id number] Date of Birth/Sex: Treating RN: Oct 06, 1940 (82 y.o. Cline Cools Primary Care Jerelene Salaam: Guerry Bruin Other Clinician: Referring Paulyne Mooty: Treating Nanna Ertle/Extender: Wyline Beady in Treatment: 1 Vital Signs Time Taken: 12:40 Temperature (F): 98.1 Pulse (bpm): 79 Respiratory Rate (breaths/min): 18 Blood Pressure (mmHg): 171/63 Reference Range: 80 - 120 mg / dl Electronic Signature(s) Signed: 03/03/2023 5:16:54 PM By: Redmond Pulling RN, BSN Hy, Robbie Louis ([id number]) By: Redmond Pulling RN, BSN [phone].pdf Page 9 of 9 Signed: 03/03/2023 5:16:54 PM Entered By: Redmond Pulling on 03/03/2023 09:41:48  Erik [name], Erik [name] ([id number]) [id number] Page 1 of 9 Visit Report for 03/03/2023 Arrival Information Details Patient Name: Date of Service: TO Erik [name]. 03/03/2023 12:30 PM Medical Record Number: [id number] Patient Account Number: [id number] Date of Birth/Sex: Treating RN: 1941-03-07 (82 y.o. Cline Cools Primary Care Jaquawn Saffran: Guerry Bruin Other Clinician: Referring Onofre Gains: Treating Daton Szilagyi/Extender: Wyline Beady in Treatment: 1 Visit Information History Since Last Visit Added or deleted any medications: No Patient Arrived: Ambulatory Any new allergies or adverse reactions: No Arrival Time: 12:40 Had a fall or experienced change in No Accompanied By: self activities of daily living that may affect Transfer Assistance: None risk of falls: Patient Identification Verified: Yes Signs or symptoms of abuse/neglect since last visito No Secondary Verification Process Completed: Yes Hospitalized since last visit: No Patient Requires Transmission-Based Precautions: No Implantable device outside of the clinic excluding No Patient Has Alerts: No cellular tissue based products placed in the center since last visit: Has Dressing in Place as Prescribed: Yes Has Compression in Place as Prescribed: Yes Pain Present Now: No Electronic Signature(s) Signed: 03/03/2023 5:16:54 PM By: Redmond Pulling RN, BSN Entered By: Redmond Pulling on 03/03/2023 09:41:25 -------------------------------------------------------------------------------- Encounter Discharge Information Details Patient Name: Date of Service: [name] 03/03/2023 12:30 PM Medical Record Number: [id number] Patient Account Number: [id number] Date of Birth/Sex: Treating RN: March 26, 1941 (82 y.o. Cline Cools Primary Care Ralph Brouwer: Guerry Bruin Other Clinician: Referring Jamale Spangler: Treating Franny Selvage/Extender: Wyline Beady in  Treatment: 1 Encounter Discharge Information Items Post Procedure Vitals Discharge Condition: Stable Temperature (F): 98.1 Ambulatory Status: Ambulatory Pulse (bpm): 79 Discharge Destination: Home Respiratory Rate (breaths/min): 18 Transportation: Private Auto Blood Pressure (mmHg): 171/63 Accompanied By: SELF Schedule Follow-up Appointment: Yes Clinical Summary of Care: Patient Declined Electronic Signature(s) Signed: 03/03/2023 5:16:54 PM By: Redmond Pulling RN, BSN Entered By: Redmond Pulling on 03/03/2023 10:31:58 [name] ([id number]) [id number] Page 2 of 9 -------------------------------------------------------------------------------- Lower Extremity Assessment Details Patient Name: Date of Service: TO Erik [name]. 03/03/2023 12:30 PM Medical Record Number: [id number] Patient Account Number: [id number] Date of Birth/Sex: Treating RN: Nov 21, 1940 (82 y.o. Cline Cools Primary Care Mystie Ormand: Guerry Bruin Other Clinician: Referring Lucille Crichlow: Treating Sammy Douthitt/Extender: Wyline Beady in Treatment: 1 Edema Assessment Assessed: [name]: No] [name]: No] [Left: Edema] [Right: :] Calf Left: Right: Point of Measurement: From Medial Instep 37.5 cm Ankle Left: Right: Point of Measurement: From Medial Instep 25 cm Vascular Assessment Pulses: Dorsalis Pedis Palpable: [Right:Yes] Extremity colors, hair growth, and conditions: Temperature of Extremity: [Right:Warm] Electronic Signature(s) Signed: 03/03/2023 5:16:54 PM By: Redmond Pulling RN, BSN Entered By: Redmond Pulling on 03/03/2023 09:49:31 -------------------------------------------------------------------------------- Multi Wound Chart Details Patient Name: Date of Service: [name] 03/03/2023 12:30 PM Medical Record Number: [id number] Patient Account Number: [id number] Date of Birth/Sex: Treating RN: 1940/12/26 (82 y.o. 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 Cline Cools Primary Care Physician: Guerry Bruin Other Clinician: Referring Physician: Treating Physician/Extender: Wyline Beady in Treatment:  1 Education Assessment Education Provided To: Patient Education Topics Provided Wound/Skin Impairment: Methods: Explain/Verbal Responses: State content correctly Electronic Signature(s) Signed: 03/03/2023 5:16:54 PM By: Redmond Pulling RN, BSN Entered By: Redmond Pulling on 03/03/2023 09:57:49 -------------------------------------------------------------------------------- Wound Assessment Details Patient Name: Date of Service: [name] 03/03/2023 12:30 PM Medical Record Number: [id number] Patient Account Number: [id number] Date of Birth/Sex: Treating RN: 01-09-1941 (82 y.o. Cline Cools Primary Care Simon Aaberg: Guerry Bruin Other Clinician: Referring Jourden Delmont: Treating Jadiel Schmieder/Extender: Tonio, Seider ([id number]) [id number] Page 6 of 9 Weeks in Treatment: 1 Wound Status Wound Number: 1 Primary Diabetic Wound/Ulcer of the Lower Extremity Etiology: Wound Location: Right Calcaneus Wound Open Wounding Event: Gradually Appeared Status: Date Acquired: 01/21/2023 Comorbid Anemia, Arrhythmia, Coronary Artery Disease, Hypertension, Weeks Of Treatment: 1 History: Peripheral Arterial Disease, Type II Diabetes, Neuropathy Clustered Wound: No Photos Wound Measurements Length: (cm) 0.3 Width: (cm) 0.4 Depth: (cm) 0.3 Area: (cm) 0.094 Volume: (cm) 0.028 % Reduction in Area: 33.3% % Reduction in Volume: 0% Epithelialization: Small (1-33%) Tunneling: No Undermining: No Wound Description Classification: Grade 2 Exudate Amount: Medium Exudate Type: Serosanguineous Exudate Color: red, brown Foul Odor After Cleansing: No Slough/Fibrino Yes Wound Bed Granulation Amount: Large (67-100%) Exposed Structure Granulation Quality: Red Fascia Exposed: No Necrotic Amount: Small (1-33%) Fat Layer (Subcutaneous Tissue) Exposed: Yes Necrotic Quality: Adherent Slough Tendon Exposed: No Muscle Exposed: No Joint  Exposed: No Bone Exposed: No Periwound Skin Texture Texture Color No Abnormalities Noted: No No Abnormalities Noted: No Callus: Yes Atrophie Blanche: No Crepitus: No Cyanosis: No Excoriation: No Ecchymosis: No Induration: No Erythema: No Rash: No Hemosiderin Staining: No Scarring: No Mottled: No Pallor: No Moisture Rubor: No No Abnormalities Noted: No Dry / Scaly: No Temperature / Pain Maceration: No Temperature: No Abnormality Tenderness on Palpation: Yes Treatment Notes Wound #1 (Calcaneus) Wound Laterality: Right Cleanser Soap and Water Discharge Instruction: May shower and wash wound with dial antibacterial soap and water prior to dressing change. Vashe 5.8 (oz) Discharge Instruction: Cleanse the wound with Vashe prior to applying a clean dressing using gauze sponges, not tissue or cotton balls. Peri-Wound Care Triamcinolone 15 (g) ZEVIN, NEVARES ([id number]) [id number] Page 7 of 9 Discharge Instruction: Use triamcinolone 15 (g) as directed Sween Lotion (Moisturizing lotion) Discharge Instruction: Apply moisturizing lotion as directed Topical Primary Dressing Hydrofera Blue Ready Transfer Foam, 2.5x2.5 (in/in) Discharge Instruction: Apply directly to wound bed as directed Secondary Dressing ALLEVYN Heel 4 1/2in x 5 1/2in / 10.5cm x 13.5cm Discharge Instruction: Apply over primary dressing as directed. ABD Pad, 5x9 Discharge Instruction: Apply over primary dressing as directed. Woven Gauze Sponge, Non-Sterile 4x4 in Discharge Instruction: Apply over primary dressing as directed. Secured With [employer] 4x5 (in/yd) Discharge Instruction: Secure with Coban as directed. Kerlix Roll Sterile, 4.5x3.1 (in/yd) Discharge Instruction: Secure with Kerlix as directed. Compression Wrap Compression Stockings Add-Ons Electronic Signature(s) Signed: 03/03/2023 5:16:54 PM By: Redmond Pulling RN, BSN Entered By: Redmond Pulling on  03/03/2023 09:53:17 -------------------------------------------------------------------------------- Wound Assessment Details Patient Name: Date of Service: [name] 03/03/2023 12:30 PM Medical Record Number: [id number] Patient Account Number: [id number] Date of Birth/Sex: Treating RN: 12-12-1940 (82 y.o.

## 2023-03-04 NOTE — Progress Notes (Signed)
ABD Pad, 5x9 1 x Per Week/30 Days Discharge Instructions: Apply over primary dressing as directed. Secondary Dressing: Woven Gauze Sponge, Non-Sterile 4x4 in 1 x Per Week/30 Days Discharge Instructions: Apply over primary dressing as directed. Secured With: Coban Self-Adherent Wrap 4x5 (in/yd) 1 x Per Week/30 Days Discharge Instructions: Secure with Coban as directed. Secured With: [employer], 4.5x3.1 (in/yd) 1 x Per Week/30 Days Discharge Instructions: Secure with Kerlix as directed. Patient Medications llergies: No Known Drug Allergies A Notifications Medication Indication Start End 03/03/2023 lidocaine DOSE topical 5 % ointment - ointment topical once daily Electronic Signature(s) Signed: 03/03/2023 5:16:54 PM By: Redmond Pulling RN, BSN Signed: 03/03/2023 5:32:55 PM By: [name] MD Entered By: Redmond Pulling on 03/03/2023 13:17:27 Problem List Details -------------------------------------------------------------------------------- Erik [name] ([id number]) [id number] Page 4 of 7 Patient Name: Date of Service: Erik Erik [name]. 03/03/2023 12:30 PM Medical Record Number: [id number] Patient Account Number: [id number] Date of Birth/Sex: Treating RN: 05-Jan-1941 (82 y.o. 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 03/03/2023 12:30 PM Medical Record Number: [id number] Patient Account Number: [id number] Date of Birth/Sex: Treating RN: 15-Jul-1940 (82 y.o. M) Primary Care Provider: Guerry Bruin Other Clinician: Referring Provider: Treating Provider/Extender: Wyline Beady in Treatment: 1 Subjective History of Present Illness (HPI) ADMISSION 02/18/2023 This is an 82 year old still very active man who was referred Erik [place] by Dr. Ardelle Anton at [facility]. He has a nonhealing wound on the plantar aspect of his right heel. He has been dealing with this since June when he presented Erik Dr. [address] with a painful area on the right mid heel. He was not really sure how this happened. He ultimately had an MRI in July that showed no osteomyelitis but there was a small abscess he was treated with antibiotics doxycycline and gradually the pain improved but he still been left with a small open area. He had an ABI on 11/14/2022 that was 1.08 on the right and 0.99 on the left. He by enlarge has been using Silvadene on the wound. Recently changed Erik silver alginate. He also developed a blister on the right anterior lower leg. Recent x-rays have not shown any cortical changes. Also in June he had a sedimentation rate  of 2 and a C-reactive protein of 14 not suggestive of chronic active infection Past medical history includes type 2 diabetes, nonischemic cardiomyopathy, hypertension cholesterolemia, skin cancer, combined congestive heart failure, paroxysmal A-fib, idiopathic trigeminal neuralgia. 10/1; ulcer on the plantar aspect of his right heel. He is being compliant with the healing boot. We have been using Hydrofera Blue heel cup gauze. Dimensions are better 10/7; the area on the leg right anterior is healed. He has chronic insufficiency. Unfortunately the ulcer on the plantar aspect of his right heel looks about  ABD Pad, 5x9 1 x Per Week/30 Days Discharge Instructions: Apply over primary dressing as directed. Secondary Dressing: Woven Gauze Sponge, Non-Sterile 4x4 in 1 x Per Week/30 Days Discharge Instructions: Apply over primary dressing as directed. Secured With: Coban Self-Adherent Wrap 4x5 (in/yd) 1 x Per Week/30 Days Discharge Instructions: Secure with Coban as directed. Secured With: [employer], 4.5x3.1 (in/yd) 1 x Per Week/30 Days Discharge Instructions: Secure with Kerlix as directed. Patient Medications llergies: No Known Drug Allergies A Notifications Medication Indication Start End 03/03/2023 lidocaine DOSE topical 5 % ointment - ointment topical once daily Electronic Signature(s) Signed: 03/03/2023 5:16:54 PM By: Redmond Pulling RN, BSN Signed: 03/03/2023 5:32:55 PM By: [name] MD Entered By: Redmond Pulling on 03/03/2023 13:17:27 Problem List Details -------------------------------------------------------------------------------- Erik [name] ([id number]) [id number] Page 4 of 7 Patient Name: Date of Service: Erik Erik [name]. 03/03/2023 12:30 PM Medical Record Number: [id number] Patient Account Number: [id number] Date of Birth/Sex: Treating RN: 05-Jan-1941 (82 y.o. 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 03/03/2023 12:30 PM Medical Record Number: [id number] Patient Account Number: [id number] Date of Birth/Sex: Treating RN: 15-Jul-1940 (82 y.o. M) Primary Care Provider: Guerry Bruin Other Clinician: Referring Provider: Treating Provider/Extender: Wyline Beady in Treatment: 1 Subjective History of Present Illness (HPI) ADMISSION 02/18/2023 This is an 82 year old still very active man who was referred Erik [place] by Dr. Ardelle Anton at [facility]. He has a nonhealing wound on the plantar aspect of his right heel. He has been dealing with this since June when he presented Erik Dr. [address] with a painful area on the right mid heel. He was not really sure how this happened. He ultimately had an MRI in July that showed no osteomyelitis but there was a small abscess he was treated with antibiotics doxycycline and gradually the pain improved but he still been left with a small open area. He had an ABI on 11/14/2022 that was 1.08 on the right and 0.99 on the left. He by enlarge has been using Silvadene on the wound. Recently changed Erik silver alginate. He also developed a blister on the right anterior lower leg. Recent x-rays have not shown any cortical changes. Also in June he had a sedimentation rate  of 2 and a C-reactive protein of 14 not suggestive of chronic active infection Past medical history includes type 2 diabetes, nonischemic cardiomyopathy, hypertension cholesterolemia, skin cancer, combined congestive heart failure, paroxysmal A-fib, idiopathic trigeminal neuralgia. 10/1; ulcer on the plantar aspect of his right heel. He is being compliant with the healing boot. We have been using Hydrofera Blue heel cup gauze. Dimensions are better 10/7; the area on the leg right anterior is healed. He has chronic insufficiency. Unfortunately the ulcer on the plantar aspect of his right heel looks about  with the heel ulcer. Electronic Signature(s) Signed: 03/03/2023 5:32:55 PM By: [name] MD Entered By: [name] on 03/03/2023 13:11:24 -------------------------------------------------------------------------------- Physical Exam Details Patient Name: Date of Service: Erik Erik Schanz W. 03/03/2023 12:30 PM Medical Record Number: [id number] Patient Account Number: [id number] Date of Birth/Sex: Treating RN: Mar 13, 1941 (82 y.o. M) Primary Care Provider: Guerry Bruin Other Clinician: Referring Provider: Treating Provider/Extender: Wyline Beady in Treatment: 1 Constitutional Patient is hypertensive.. Pulse regular and within target [name] for patient.[name] Respirations regular, non-labored and within target [name].. Temperature is normal and within the target [name] for the patient.[name] Appears in no distress. Notes Wound exam; the right leg wound is healed this was anteriorly. He has good edema control. Unfortunately the wound on the plantar right heel is absolutely no better. This is a punched-out area with continuing circumferential undermining. Ischial week I been using a #3 curette Erik remove the over hanging tissue but it seems Erik recur. I used a #3 curette to do this bleeding was risk because of Plavix, hemostasis was silver nitrate. This does not probe Erik bone. No current evidence of infection Electronic Signature(s) Signed: 03/03/2023 5:32:55 PM By: [name] MD Entered By: [name] on 03/03/2023 13:12:36 -------------------------------------------------------------------------------- Physician Orders Details Patient Name: Date of Service: Erik Erik Schanz W. 03/03/2023 12:30 PM Medical Record Number:  [id number] Patient Account Number: [id number] Date of Birth/Sex: Treating RN: 03-Nov-1940 (82 y.o. Cline Cools Primary Care Provider: Guerry Bruin Other Clinician: Referring Provider: Treating Provider/Extender: Wyline Beady in Treatment: 1 Verbal / Phone Orders: No Diagnosis Coding Erik [name], Erik [name] ([id number]) [id number] Page 3 of 7 Follow-up Appointments ppointment in 1 week. - Dr Leanord Hawking - Tuesday 10/15 @ 8:00am Return A ppointment in 2 weeks. - Dr Leanord Hawking Thursday 10/24 @ 10:30am Return A ppointment in: - Dr Leanord Hawking cast change Thursday 10/17 @ 10:30am Return A Other: - Have wife drive Erik next visit. Will have cast applied and will not be able Erik drive Anesthetic Wound #1 Right Calcaneus (In clinic) Topical Lidocaine 5% applied Erik wound bed Bathing/ Shower/ Hygiene May shower with protection but do not get wound dressing(s) wet. Protect dressing(s) with water repellant cover (for example, large plastic bag) or a cast cover and may then take shower. - purchase cast protector from [facility], [facility] or [employer] Edema Control - Lymphedema / SCD / Other Bilateral Lower Extremities Elevate legs Erik the level of the heart or above for 30 minutes daily and/or when sitting for 3-4 times a day throughout the day. Avoid standing for long periods of time. Wound Treatment Wound #1 - Calcaneus Wound Laterality: Right Cleanser: Soap and Water 1 x Per Week/30 Days Discharge Instructions: May shower and wash wound with dial antibacterial soap and water prior Erik dressing change. Cleanser: Vashe 5.8 (oz) 1 x Per Week/30 Days Discharge Instructions: Cleanse the wound with Vashe prior Erik applying a clean dressing using gauze sponges, not tissue or cotton balls. Peri-Wound Care: Triamcinolone 15 (g) 1 x Per Week/30 Days Discharge Instructions: Use triamcinolone 15 (g) as directed Peri-Wound Care: Sween Lotion (Moisturizing lotion) 1 x Per Week/30  Days Discharge Instructions: Apply moisturizing lotion as directed Prim Dressing: Hydrofera Blue Ready Transfer Foam, 2.5x2.5 (in/in) 1 x Per Week/30 Days ary Discharge Instructions: Apply directly Erik wound bed as directed Secondary Dressing: ALLEVYN Heel 4 1/2in x 5 1/2in / 10.5cm x 13.5cm 1 x Per Week/30 Days Discharge Instructions: Apply over primary dressing as directed. Secondary Dressing:  ABD Pad, 5x9 1 x Per Week/30 Days Discharge Instructions: Apply over primary dressing as directed. Secondary Dressing: Woven Gauze Sponge, Non-Sterile 4x4 in 1 x Per Week/30 Days Discharge Instructions: Apply over primary dressing as directed. Secured With: Coban Self-Adherent Wrap 4x5 (in/yd) 1 x Per Week/30 Days Discharge Instructions: Secure with Coban as directed. Secured With: [employer], 4.5x3.1 (in/yd) 1 x Per Week/30 Days Discharge Instructions: Secure with Kerlix as directed. Patient Medications llergies: No Known Drug Allergies A Notifications Medication Indication Start End 03/03/2023 lidocaine DOSE topical 5 % ointment - ointment topical once daily Electronic Signature(s) Signed: 03/03/2023 5:16:54 PM By: Redmond Pulling RN, BSN Signed: 03/03/2023 5:32:55 PM By: [name] MD Entered By: Redmond Pulling on 03/03/2023 13:17:27 Problem List Details -------------------------------------------------------------------------------- Erik [name] ([id number]) [id number] Page 4 of 7 Patient Name: Date of Service: Erik Erik [name]. 03/03/2023 12:30 PM Medical Record Number: [id number] Patient Account Number: [id number] Date of Birth/Sex: Treating RN: 05-Jan-1941 (82 y.o. 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 03/03/2023 12:30 PM Medical Record Number: [id number] Patient Account Number: [id number] Date of Birth/Sex: Treating RN: 15-Jul-1940 (82 y.o. M) Primary Care Provider: Guerry Bruin Other Clinician: Referring Provider: Treating Provider/Extender: Wyline Beady in Treatment: 1 Subjective History of Present Illness (HPI) ADMISSION 02/18/2023 This is an 82 year old still very active man who was referred [place] by Dr. Ardelle Anton at [facility]. He has a nonhealing wound on the plantar aspect of his right heel. He has been dealing with this since June when he presented Erik Dr. [address] with a painful area on the right mid heel. He was not really sure how this happened. He ultimately had an MRI in July that showed no osteomyelitis but there was a small abscess he was treated with antibiotics doxycycline and gradually the pain improved but he still been left with a small open area. He had an ABI on 11/14/2022 that was 1.08 on the right and 0.99 on the left. He by enlarge has been using Silvadene on the wound. Recently changed Erik silver alginate. He also developed a blister on the right anterior lower leg. Recent x-rays have not shown any cortical changes. Also in June he had a sedimentation rate  of 2 and a C-reactive protein of 14 not suggestive of chronic active infection Past medical history includes type 2 diabetes, nonischemic cardiomyopathy, hypertension cholesterolemia, skin cancer, combined congestive heart failure, paroxysmal A-fib, idiopathic trigeminal neuralgia. 10/1; ulcer on the plantar aspect of his right heel. He is being compliant with the healing boot. We have been using Hydrofera Blue heel cup gauze. Dimensions are better 10/7; the area on the leg right anterior is healed. He has chronic insufficiency. Unfortunately the ulcer on the plantar aspect of his right heel looks about  with the heel ulcer. Electronic Signature(s) Signed: 03/03/2023 5:32:55 PM By: [name] MD Entered By: [name] on 03/03/2023 13:11:24 -------------------------------------------------------------------------------- Physical Exam Details Patient Name: Date of Service: Erik Erik Schanz W. 03/03/2023 12:30 PM Medical Record Number: [id number] Patient Account Number: [id number] Date of Birth/Sex: Treating RN: Mar 13, 1941 (82 y.o. M) Primary Care Provider: Guerry Bruin Other Clinician: Referring Provider: Treating Provider/Extender: Wyline Beady in Treatment: 1 Constitutional Patient is hypertensive.. Pulse regular and within target [name] for patient.[name] Respirations regular, non-labored and within target [name].. Temperature is normal and within the target [name] for the patient.[name] Appears in no distress. Notes Wound exam; the right leg wound is healed this was anteriorly. He has good edema control. Unfortunately the wound on the plantar right heel is absolutely no better. This is a punched-out area with continuing circumferential undermining. Ischial week I been using a #3 curette Erik remove the over hanging tissue but it seems Erik recur. I used a #3 curette to do this bleeding was risk because of Plavix, hemostasis was silver nitrate. This does not probe Erik bone. No current evidence of infection Electronic Signature(s) Signed: 03/03/2023 5:32:55 PM By: [name] MD Entered By: [name] on 03/03/2023 13:12:36 -------------------------------------------------------------------------------- Physician Orders Details Patient Name: Date of Service: Erik Erik Schanz W. 03/03/2023 12:30 PM Medical Record Number:  [id number] Patient Account Number: [id number] Date of Birth/Sex: Treating RN: 03-Nov-1940 (82 y.o. Cline Cools Primary Care Provider: Guerry Bruin Other Clinician: Referring Provider: Treating Provider/Extender: Wyline Beady in Treatment: 1 Verbal / Phone Orders: No Diagnosis Coding Erik [name], Erik [name] ([id number]) [id number] Page 3 of 7 Follow-up Appointments ppointment in 1 week. - Dr Leanord Hawking - Tuesday 10/15 @ 8:00am Return A ppointment in 2 weeks. - Dr Leanord Hawking Thursday 10/24 @ 10:30am Return A ppointment in: - Dr Leanord Hawking cast change Thursday 10/17 @ 10:30am Return A Other: - Have wife drive Erik next visit. Will have cast applied and will not be able Erik drive Anesthetic Wound #1 Right Calcaneus (In clinic) Topical Lidocaine 5% applied Erik wound bed Bathing/ Shower/ Hygiene May shower with protection but do not get wound dressing(s) wet. Protect dressing(s) with water repellant cover (for example, large plastic bag) or a cast cover and may then take shower. - purchase cast protector from [facility], [facility] or [employer] Edema Control - Lymphedema / SCD / Other Bilateral Lower Extremities Elevate legs Erik the level of the heart or above for 30 minutes daily and/or when sitting for 3-4 times a day throughout the day. Avoid standing for long periods of time. Wound Treatment Wound #1 - Calcaneus Wound Laterality: Right Cleanser: Soap and Water 1 x Per Week/30 Days Discharge Instructions: May shower and wash wound with dial antibacterial soap and water prior Erik dressing change. Cleanser: Vashe 5.8 (oz) 1 x Per Week/30 Days Discharge Instructions: Cleanse the wound with Vashe prior Erik applying a clean dressing using gauze sponges, not tissue or cotton balls. Peri-Wound Care: Triamcinolone 15 (g) 1 x Per Week/30 Days Discharge Instructions: Use triamcinolone 15 (g) as directed Peri-Wound Care: Sween Lotion (Moisturizing lotion) 1 x Per Week/30  Days Discharge Instructions: Apply moisturizing lotion as directed Prim Dressing: Hydrofera Blue Ready Transfer Foam, 2.5x2.5 (in/in) 1 x Per Week/30 Days ary Discharge Instructions: Apply directly Erik wound bed as directed Secondary Dressing: ALLEVYN Heel 4 1/2in x 5 1/2in / 10.5cm x 13.5cm 1 x Per Week/30 Days Discharge Instructions: Apply over primary dressing as directed. Secondary Dressing:  Erik [name], Erik [name] ([id number]) [id number] Page 1 of 7 Visit Report for 03/03/2023 Debridement Details Patient Name: Date of Service: Erik Erik [name]. 03/03/2023 12:30 PM Medical Record Number: [id number] Patient Account Number: [id number] Date of Birth/Sex: Treating RN: 1940-07-12 (82 y.o. M) Primary Care Provider: Guerry Bruin Other Clinician: Referring Provider: Treating Provider/Extender: Wyline Beady in Treatment: 1 Debridement Performed for Assessment: Wound #1 Right Calcaneus Performed By: Physician Maxwell Caul., MD The following information was scribed by: Redmond Pulling The information was scribed for: [name] Debridement Type: Debridement Severity of Tissue Pre Debridement: Fat layer exposed Level of Consciousness (Pre-procedure): Awake and Alert Pre-procedure Verification/Time Out Yes - 12:55 Taken: Start Time: 12:59 Pain Control: Lidocaine 5% topical ointment Percent of Wound Bed Debrided: 100% T Area Debrided (cm): otal 0.78 Tissue and other material debrided: Non-Viable, Slough, Subcutaneous, Skin: Dermis , Skin: Epidermis, Slough Level: Skin/Subcutaneous Tissue Debridement Description: Excisional Instrument: Curette Bleeding: Moderate Hemostasis Achieved: Silver Nitrate Response Erik Treatment: Procedure was tolerated well Level of Consciousness (Post- Awake and Alert procedure): Post Debridement Measurements of Total Wound Length: (cm) 1 Width: (cm) 1 Depth: (cm) 0.5 Volume: (cm) 0.393 Character of Wound/Ulcer Post Debridement: Improved Severity of Tissue Post Debridement: Fat layer exposed Post Procedure Diagnosis Same as Pre-procedure Electronic Signature(s) Signed: 03/03/2023 5:32:55 PM By: [name] MD Entered By: [name] on 03/03/2023 13:10:05 -------------------------------------------------------------------------------- HPI Details Patient Name: Date of  Service: Erik Erik [name], Erik Fantasia W. 03/03/2023 12:30 PM Medical Record Number: [id number] Patient Account Number: [id number] Date of Birth/Sex: Treating RN: 1941-05-02 (83 y.o. M) Primary Care Provider: Guerry Bruin Other Clinician: Referring Provider: Treating Provider/Extender: Wyline Beady in Treatment: 1 History of Present Illness Erik [name], Erik [name] ([id number]) [id number] Page 2 of 7 HPI Description: ADMISSION 02/18/2023 This is an 82 year old still very active man who was referred [place] by Dr. Ardelle Anton at [facility]. He has a nonhealing wound on the plantar aspect of his right heel. He has been dealing with this since June when he presented Erik Dr. [address] with a painful area on the right mid heel. He was not really sure how this happened. He ultimately had an MRI in July that showed no osteomyelitis but there was a small abscess he was treated with antibiotics doxycycline and gradually the pain improved but he still been left with a small open area. He had an ABI on 11/14/2022 that was 1.08 on the right and 0.99 on the left. He by enlarge has been using Silvadene on the wound. Recently changed Erik silver alginate. He also developed a blister on the right anterior lower leg. Recent x-rays have not shown any cortical changes. Also in June he had a sedimentation rate of 2 and a C-reactive protein of 14 not suggestive of chronic active infection Past medical history includes type 2 diabetes, nonischemic cardiomyopathy, hypertension cholesterolemia, skin cancer, combined congestive heart failure, paroxysmal A-fib, idiopathic trigeminal neuralgia. 10/1; ulcer on the plantar aspect of his right heel. He is being compliant with the healing boot. We have been using Hydrofera Blue heel cup gauze. Dimensions are better 10/7; the area on the leg right anterior is healed. He has chronic insufficiency. Unfortunately the ulcer on the plantar aspect of  his right heel looks about the same each time with undermining the skin is not adherent Erik the underlying tissue. This requires debridement. We have been using Hydrofera Blue, heel cup and heel offloading shoe. We are not making progress

## 2023-03-07 DIAGNOSIS — R351 Nocturia: Secondary | ICD-10-CM | POA: Diagnosis not present

## 2023-03-07 DIAGNOSIS — N401 Enlarged prostate with lower urinary tract symptoms: Secondary | ICD-10-CM | POA: Diagnosis not present

## 2023-03-11 ENCOUNTER — Encounter: Payer: PPO | Admitting: Vascular Surgery

## 2023-03-11 ENCOUNTER — Encounter (HOSPITAL_BASED_OUTPATIENT_CLINIC_OR_DEPARTMENT_OTHER): Payer: PPO | Admitting: Internal Medicine

## 2023-03-11 DIAGNOSIS — E11621 Type 2 diabetes mellitus with foot ulcer: Secondary | ICD-10-CM | POA: Diagnosis not present

## 2023-03-11 DIAGNOSIS — L97412 Non-pressure chronic ulcer of right heel and midfoot with fat layer exposed: Secondary | ICD-10-CM | POA: Diagnosis not present

## 2023-03-11 NOTE — Progress Notes (Signed)
wound he had in this area has resolved 2 weeks ago I believe. He continues to have a punched-out area on the  mid aspect of his right heel. Significant undermining especially laterally and at 1:00. This does not probe to bone. Proximal to the actual small wound is some degree of dusky erythema and bogginess. There is no purulence. No crepitus. He is not tender but he has significant neuropathy that may make this less reliable as an indicator of infection KDEN, WAGSTER ([id number]) [id number] Page 2 of 6 Electronic Signature(s) Signed: 03/11/2023 4:40:46 PM By: [name] MD Entered By: [name] on 03/11/2023 08:38:29 -------------------------------------------------------------------------------- Physician Orders Details Patient Name: Date of Service: TO Erik [name] NK W. 03/11/2023 8:00 A M Medical Record Number: [id number] Patient Account Number: [id number] Date of Birth/Sex: Treating RN: 1940/07/16 (82 y.o. 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 - Dr Leanord Hawking Thursday 10/24 @ 10:30am Return A ****CANCEL THIS THURSDAY APPT***** ppointment in 2 weeks. - Dr. Leanord Hawking 03/27/2023 Thursday (Front office to schedule) Return A Return appointment in 3 weeks. - Dr. Leanord Hawking (Front office to schedule) Other: - Culture taken- will call you once the results return. Go to  pharmacy today and pick up oral antibiotics. If redness streaks up leg or passed the skin marking around the heel go to the emergency department. Anesthetic Wound #1 Right Calcaneus (In clinic) Topical Lidocaine 5% applied to wound bed Bathing/ Shower/ Hygiene May shower with protection but do not get wound dressing(s) wet. Protect dressing(s) with water repellant cover (for example, large plastic bag) or a cast cover and may then take shower. - purchase cast protector from [facility], [facility] or [employer] Edema Control - Lymphedema / SCD / Other Bilateral Lower Extremities Elevate legs to the level of the heart or above for 30 minutes daily and/or when sitting for 3-4 times a day throughout the day. Avoid standing for long periods of time. Off-Loading Wedge shoe to: - heel offloading shoe- wear while walking and standing. Mininize walking and standing. Wound Treatment Wound #1 - Calcaneus Wound Laterality: Right Cleanser: Soap and Water 1 x Per Week/30 Days Discharge Instructions: May shower and wash wound with dial antibacterial soap and water prior to dressing change. Cleanser: Vashe 5.8 (oz) 1 x Per Week/30 Days Discharge Instructions: Cleanse the wound with Vashe prior to applying a clean dressing using gauze sponges, not tissue or cotton balls. Peri-Wound Care: Triamcinolone 15 (g) 1 x Per Week/30 Days Discharge Instructions: Use triamcinolone 15 (g) as directed Peri-Wound Care: Sween Lotion (Moisturizing lotion) 1 x Per Week/30 Days Discharge Instructions: Apply moisturizing lotion as directed Topical: Mupirocin Ointment 1 x Per Week/30 Days Discharge Instructions: Apply Mupirocin (Bactroban) as instructed Prim Dressing: Maxorb Extra Ag+ Alginate Dressing, 2x2 (in/in) ary 1 x Per Week/30 Days [name], [name] ([id number]) [id number] Page 3 of 6 Discharge Instructions: Apply to wound bed as instructed Secondary Dressing: ALLEVYN Heel 4 1/2in x 5 1/2in /  10.5cm  x 13.5cm 1 x Per Week/30 Days Discharge Instructions: Apply over primary dressing as directed. Secondary Dressing: Woven Gauze Sponge, Non-Sterile 4x4 in 1 x Per Week/30 Days Discharge Instructions: Apply over primary dressing as directed. Secured With: Coban Self-Adherent Wrap 4x5 (in/yd) 1 x Per Week/30 Days Discharge Instructions: Secure with Coban as directed. Secured With: [employer], 4.5x3.1 (in/yd) 1 x Per Week/30 Days Discharge Instructions: Secure with Kerlix as directed. Patient Medications llergies: No Known Drug Allergies A Notifications Medication Indication Start End wound infection rt heel 03/11/2023 doxycycline monohydrate DOSE oral 100 mg capsule - 1 capsule oral twice a day for 7 days Electronic Signature(s) Signed: 03/11/2023 8:34:59 AM By: [name] MD Entered By: [name] on 03/11/2023 08:34:58 -------------------------------------------------------------------------------- Problem List Details Patient Name: Date of Service: TO Erik [name] W. 03/11/2023 8:00 A M Medical Record Number: [id number] Patient Account Number: [id number] Date of Birth/Sex: Treating RN: 1940/09/12 (82 y.o. 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 02/18/2023 No Yes severity I87.331 Chronic venous hypertension (idiopathic) with ulcer and inflammation of right 02/18/2023 No Yes lower extremity Inactive Problems ICD-10 Code Description Active Date Inactive Date L97.811 Non-pressure chronic ulcer of other part of right lower leg limited to breakdown of skin 02/18/2023 02/18/2023 Resolved Problems Erik, [name] ([id number]) [id number] Page 4 of 6 Electronic Signature(s) Signed: 03/11/2023 4:40:46 PM By: [name] MD Entered By: [name] on 03/11/2023 08:35:33 -------------------------------------------------------------------------------- Progress Note Details Patient Name: Date of Service: TO [name], [name] 03/11/2023 8:00 A M Medical Record Number: [id number] Patient Account Number: [id number] Date of Birth/Sex: Treating RN: November 21, 1940 (82 y.o. M) Primary Care Provider: Guerry [name] Other Clinician: Referring Provider: Treating Provider/Extender: Wyline Beady in Treatment: 3 Subjective History of Present Illness (HPI) ADMISSION 02/18/2023 This is an 82 year old still very active man who was referred to [place] by Dr. Ardelle Anton at [facility]. He has a nonhealing wound on the plantar aspect of his right heel. He has been dealing with this since June when he presented to Dr. Loreta Ave with a painful area on the right mid heel. He was not really sure how this happened. He ultimately had an MRI in July that showed no osteomyelitis but there was a small abscess he was treated with antibiotics doxycycline and gradually the pain improved but he still been left with a small open area. He had an ABI on 11/14/2022 that was 1.08 on the right and 0.99 on the left. He by enlarge has been using Silvadene on the wound. Recently changed to silver alginate. He also developed a blister on the right anterior lower leg. Recent x-rays have not shown any cortical changes. Also in June he had a sedimentation rate of 2 and a C-reactive protein of 14 not suggestive of chronic active infection Past medical history includes type 2 diabetes, nonischemic cardiomyopathy, hypertension cholesterolemia, skin cancer, combined congestive heart failure, paroxysmal A-fib, idiopathic trigeminal neuralgia. 10/1; ulcer on the plantar aspect of his right heel. He is  being compliant with the healing boot. We have been using Hydrofera Blue heel cup gauze. Dimensions are better 10/7; the area on the leg right anterior is healed. He has chronic insufficiency. Unfortunately  DEVONTE, MIGUES ([id number]) [id number] Page 1 of 6 Visit Report for 03/11/2023 HPI Details Patient Name: Date of Service: TO Erik [name] NK W. 03/11/2023 8:00 A M Medical Record Number: [id number] Patient Account Number: [id number] Date of Birth/Sex: Treating RN: 1940/08/15 (82 y.o. M) Primary Care Provider: Guerry [name] Other Clinician: Referring Provider: Treating Provider/Extender: Wyline Beady in Treatment: 3 History of Present Illness HPI Description: ADMISSION 02/18/2023 This is an 82 year old still very active man who was referred to [place] by Dr. Ardelle Anton at [facility]. He has a nonhealing wound on the plantar aspect of his right heel. He has been dealing with this since June when he presented to Dr. Loreta Ave with a painful area on the right mid heel. He was not really sure how this happened. He ultimately had an MRI in July that showed no osteomyelitis but there was a small abscess he was treated with antibiotics doxycycline and gradually the pain improved but he still been left with a small open area. He had an ABI on 11/14/2022 that was 1.08 on the right and 0.99 on the left. He by enlarge has been using Silvadene on the wound. Recently changed to silver alginate. He also developed a blister on the right anterior lower leg. Recent x-rays have not shown any cortical changes. Also in June he had a sedimentation rate of 2 and a C-reactive protein of 14 not suggestive of chronic active infection Past medical history includes type 2 diabetes, nonischemic cardiomyopathy, hypertension cholesterolemia, skin cancer, combined congestive heart failure, paroxysmal A-fib, idiopathic trigeminal neuralgia. 10/1; ulcer on the plantar aspect of his right heel. He is being compliant with the healing boot. We have been using Hydrofera Blue heel cup gauze. Dimensions are better 10/7; the area on the leg right anterior is healed. He has chronic  insufficiency. Unfortunately the ulcer on the plantar aspect of his right heel looks about the same each time with undermining the skin is not adherent to the underlying tissue. This requires debridement. We have been using Hydrofera Blue, heel cup and heel offloading shoe. We are not making progress with the heel ulcer. 10/15; I brought the patient in today with the intention of putting him in a total contact cast if the wound on his plantar heel on the right was no better. However he comes in with erythema spreading towards the tip of his heel. There is also bogginess. There is no crepitus. He is a very active man. He is in the heel off loader but I do not think for 1 reason or another this is doing the job in terms of pressure and friction relief. I am concerned enough about the wound today not to put on a total contact cast Electronic Signature(s) Signed: 03/11/2023 4:40:46 PM By: [name] MD Entered By: [name] on 03/11/2023 08:37:15 -------------------------------------------------------------------------------- Physical Exam Details Patient Name: Date of Service: TO Erik [name] W. 03/11/2023 8:00 A M Medical Record Number: [id number] Patient Account Number: [id number] Date of Birth/Sex: Treating RN: [birth year] (82 y.o. M) Primary Care Provider: Guerry [name] Other Clinician: Referring Provider: Treating Provider/Extender: Wyline Beady in Treatment: 3 Constitutional Sitting or standing Blood Pressure is within target range for patient.. Pulse regular and within target range for patient.[name] Respirations regular, non-labored and within target range.. Temperature is normal and within the target range for the patient.[name] Appears in no distress. Notes Wound exam; patient has chronic venous venous insufficiency his edema control is good the  10.5cm  x 13.5cm 1 x Per Week/30 Days Discharge Instructions: Apply over primary dressing as directed. Secondary Dressing: Woven Gauze Sponge, Non-Sterile 4x4 in 1 x Per Week/30 Days Discharge Instructions: Apply over primary dressing as directed. Secured With: Coban Self-Adherent Wrap 4x5 (in/yd) 1 x Per Week/30 Days Discharge Instructions: Secure with Coban as directed. Secured With: [employer], 4.5x3.1 (in/yd) 1 x Per Week/30 Days Discharge Instructions: Secure with Kerlix as directed. Patient Medications llergies: No Known Drug Allergies A Notifications Medication Indication Start End wound infection rt heel 03/11/2023 doxycycline monohydrate DOSE oral 100 mg capsule - 1 capsule oral twice a day for 7 days Electronic Signature(s) Signed: 03/11/2023 8:34:59 AM By: [name] MD Entered By: [name] on 03/11/2023 08:34:58 -------------------------------------------------------------------------------- Problem List Details Patient Name: Date of Service: TO Erik [name] W. 03/11/2023 8:00 A M Medical Record Number: [id number] Patient Account Number: [id number] Date of Birth/Sex: Treating RN: 1940/09/12 (82 y.o. 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 02/18/2023 No Yes severity I87.331 Chronic venous hypertension (idiopathic) with ulcer and inflammation of right 02/18/2023 No Yes lower extremity Inactive Problems ICD-10 Code Description Active Date Inactive Date L97.811 Non-pressure chronic ulcer of other part of right lower leg limited to breakdown of skin 02/18/2023 02/18/2023 Resolved Problems Erik, [name] ([id number]) [id number] Page 4 of 6 Electronic Signature(s) Signed: 03/11/2023 4:40:46 PM By: [name] MD Entered By: [name] on 03/11/2023 08:35:33 -------------------------------------------------------------------------------- Progress Note Details Patient Name: Date of Service: TO [name], [name] 03/11/2023 8:00 A M Medical Record Number: [id number] Patient Account Number: [id number] Date of Birth/Sex: Treating RN: November 21, 1940 (82 y.o. M) Primary Care Provider: Guerry [name] Other Clinician: Referring Provider: Treating Provider/Extender: Wyline Beady in Treatment: 3 Subjective History of Present Illness (HPI) ADMISSION 02/18/2023 This is an 82 year old still very active man who was referred to [place] by Dr. Ardelle Anton at [facility]. He has a nonhealing wound on the plantar aspect of his right heel. He has been dealing with this since June when he presented to Dr. Loreta Ave with a painful area on the right mid heel. He was not really sure how this happened. He ultimately had an MRI in July that showed no osteomyelitis but there was a small abscess he was treated with antibiotics doxycycline and gradually the pain improved but he still been left with a small open area. He had an ABI on 11/14/2022 that was 1.08 on the right and 0.99 on the left. He by enlarge has been using Silvadene on the wound. Recently changed to silver alginate. He also developed a blister on the right anterior lower leg. Recent x-rays have not shown any cortical changes. Also in June he had a sedimentation rate of 2 and a C-reactive protein of 14 not suggestive of chronic active infection Past medical history includes type 2 diabetes, nonischemic cardiomyopathy, hypertension cholesterolemia, skin cancer, combined congestive heart failure, paroxysmal A-fib, idiopathic trigeminal neuralgia. 10/1; ulcer on the plantar aspect of his right heel. He is  being compliant with the healing boot. We have been using Hydrofera Blue heel cup gauze. Dimensions are better 10/7; the area on the leg right anterior is healed. He has chronic insufficiency. Unfortunately  10.5cm  x 13.5cm 1 x Per Week/30 Days Discharge Instructions: Apply over primary dressing as directed. Secondary Dressing: Woven Gauze Sponge, Non-Sterile 4x4 in 1 x Per Week/30 Days Discharge Instructions: Apply over primary dressing as directed. Secured With: Coban Self-Adherent Wrap 4x5 (in/yd) 1 x Per Week/30 Days Discharge Instructions: Secure with Coban as directed. Secured With: [employer], 4.5x3.1 (in/yd) 1 x Per Week/30 Days Discharge Instructions: Secure with Kerlix as directed. Patient Medications llergies: No Known Drug Allergies A Notifications Medication Indication Start End wound infection rt heel 03/11/2023 doxycycline monohydrate DOSE oral 100 mg capsule - 1 capsule oral twice a day for 7 days Electronic Signature(s) Signed: 03/11/2023 8:34:59 AM By: [name] MD Entered By: [name] on 03/11/2023 08:34:58 -------------------------------------------------------------------------------- Problem List Details Patient Name: Date of Service: TO Erik [name] W. 03/11/2023 8:00 A M Medical Record Number: [id number] Patient Account Number: [id number] Date of Birth/Sex: Treating RN: 1940/09/12 (82 y.o. 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 02/18/2023 No Yes severity I87.331 Chronic venous hypertension (idiopathic) with ulcer and inflammation of right 02/18/2023 No Yes lower extremity Inactive Problems ICD-10 Code Description Active Date Inactive Date L97.811 Non-pressure chronic ulcer of other part of right lower leg limited to breakdown of skin 02/18/2023 02/18/2023 Resolved Problems Erik, [name] ([id number]) [id number] Page 4 of 6 Electronic Signature(s) Signed: 03/11/2023 4:40:46 PM By: [name] MD Entered By: [name] on 03/11/2023 08:35:33 -------------------------------------------------------------------------------- Progress Note Details Patient Name: Date of Service: TO [name], [name] 03/11/2023 8:00 A M Medical Record Number: [id number] Patient Account Number: [id number] Date of Birth/Sex: Treating RN: November 21, 1940 (82 y.o. M) Primary Care Provider: Guerry [name] Other Clinician: Referring Provider: Treating Provider/Extender: Wyline Beady in Treatment: 3 Subjective History of Present Illness (HPI) ADMISSION 02/18/2023 This is an 82 year old still very active man who was referred to [place] by Dr. Ardelle Anton at [facility]. He has a nonhealing wound on the plantar aspect of his right heel. He has been dealing with this since June when he presented to Dr. Loreta Ave with a painful area on the right mid heel. He was not really sure how this happened. He ultimately had an MRI in July that showed no osteomyelitis but there was a small abscess he was treated with antibiotics doxycycline and gradually the pain improved but he still been left with a small open area. He had an ABI on 11/14/2022 that was 1.08 on the right and 0.99 on the left. He by enlarge has been using Silvadene on the wound. Recently changed to silver alginate. He also developed a blister on the right anterior lower leg. Recent x-rays have not shown any cortical changes. Also in June he had a sedimentation rate of 2 and a C-reactive protein of 14 not suggestive of chronic active infection Past medical history includes type 2 diabetes, nonischemic cardiomyopathy, hypertension cholesterolemia, skin cancer, combined congestive heart failure, paroxysmal A-fib, idiopathic trigeminal neuralgia. 10/1; ulcer on the plantar aspect of his right heel. He is  being compliant with the healing boot. We have been using Hydrofera Blue heel cup gauze. Dimensions are better 10/7; the area on the leg right anterior is healed. He has chronic insufficiency. Unfortunately  DEVONTE, MIGUES ([id number]) [id number] Page 1 of 6 Visit Report for 03/11/2023 HPI Details Patient Name: Date of Service: TO Erik [name] NK W. 03/11/2023 8:00 A M Medical Record Number: [id number] Patient Account Number: [id number] Date of Birth/Sex: Treating RN: 1940/08/15 (82 y.o. M) Primary Care Provider: Guerry [name] Other Clinician: Referring Provider: Treating Provider/Extender: Wyline Beady in Treatment: 3 History of Present Illness HPI Description: ADMISSION 02/18/2023 This is an 82 year old still very active man who was referred to [place] by Dr. Ardelle Anton at [facility]. He has a nonhealing wound on the plantar aspect of his right heel. He has been dealing with this since June when he presented to Dr. Loreta Ave with a painful area on the right mid heel. He was not really sure how this happened. He ultimately had an MRI in July that showed no osteomyelitis but there was a small abscess he was treated with antibiotics doxycycline and gradually the pain improved but he still been left with a small open area. He had an ABI on 11/14/2022 that was 1.08 on the right and 0.99 on the left. He by enlarge has been using Silvadene on the wound. Recently changed to silver alginate. He also developed a blister on the right anterior lower leg. Recent x-rays have not shown any cortical changes. Also in June he had a sedimentation rate of 2 and a C-reactive protein of 14 not suggestive of chronic active infection Past medical history includes type 2 diabetes, nonischemic cardiomyopathy, hypertension cholesterolemia, skin cancer, combined congestive heart failure, paroxysmal A-fib, idiopathic trigeminal neuralgia. 10/1; ulcer on the plantar aspect of his right heel. He is being compliant with the healing boot. We have been using Hydrofera Blue heel cup gauze. Dimensions are better 10/7; the area on the leg right anterior is healed. He has chronic  insufficiency. Unfortunately the ulcer on the plantar aspect of his right heel looks about the same each time with undermining the skin is not adherent to the underlying tissue. This requires debridement. We have been using Hydrofera Blue, heel cup and heel offloading shoe. We are not making progress with the heel ulcer. 10/15; I brought the patient in today with the intention of putting him in a total contact cast if the wound on his plantar heel on the right was no better. However he comes in with erythema spreading towards the tip of his heel. There is also bogginess. There is no crepitus. He is a very active man. He is in the heel off loader but I do not think for 1 reason or another this is doing the job in terms of pressure and friction relief. I am concerned enough about the wound today not to put on a total contact cast Electronic Signature(s) Signed: 03/11/2023 4:40:46 PM By: [name] MD Entered By: [name] on 03/11/2023 08:37:15 -------------------------------------------------------------------------------- Physical Exam Details Patient Name: Date of Service: TO Erik [name] W. 03/11/2023 8:00 A M Medical Record Number: [id number] Patient Account Number: [id number] Date of Birth/Sex: Treating RN: [birth year] (82 y.o. M) Primary Care Provider: Guerry [name] Other Clinician: Referring Provider: Treating Provider/Extender: Wyline Beady in Treatment: 3 Constitutional Sitting or standing Blood Pressure is within target range for patient.. Pulse regular and within target range for patient.[name] Respirations regular, non-labored and within target range.. Temperature is normal and within the target range for the patient.[name] Appears in no distress. Notes Wound exam; patient has chronic venous venous insufficiency his edema control is good the

## 2023-03-12 DIAGNOSIS — Z6826 Body mass index (BMI) 26.0-26.9, adult: Secondary | ICD-10-CM | POA: Diagnosis not present

## 2023-03-12 DIAGNOSIS — M51362 Other intervertebral disc degeneration, lumbar region with discogenic back pain and lower extremity pain: Secondary | ICD-10-CM | POA: Diagnosis not present

## 2023-03-12 NOTE — Progress Notes (Signed)
Target Resolution Date: 04/26/2023 Goal Status: Active Interventions: Assess patient/caregiver ability to obtain necessary supplies Assess patient/caregiver ability to perform ulcer/skin care regimen upon admission and as needed Assess ulceration(s) every visit Provide education on ulcer and skin care Notes: Electronic Signature(s) Signed: 03/12/2023 3:07:10 PM By: Shawn Stall RN, BSN Entered By: Shawn Stall on 03/11/2023 08:16:52 -------------------------------------------------------------------------------- Pain Assessment Details Patient Name: Date of Service: [name] 03/11/2023 8:00 A M Medical Record Number: [id number] Patient Account Number:  [id number] Date of Birth/Sex: Treating RN: 05/23/1941 (82 y.o. Erik [name] Primary Care Delta Deshmukh: Guerry Bruin Other Clinician: Referring Charnay Nazario: Treating Wilfred Siverson/Extender: Wyline Beady in Treatment: 3 Active Problems Location of Pain Severity and Description of Pain SHOWN, DISSINGER ([id number]) [id number] Page 7 of 9 Patient Has Paino No Site Locations Pain Management and Medication Current Pain Management: Electronic Signature(s) Signed: 03/12/2023 3:07:10 PM By: Shawn Stall RN, BSN Entered By: Shawn Stall on 03/11/2023 08:14:07 -------------------------------------------------------------------------------- Patient/Caregiver Education Details Patient Name: Date of Service: TO Erik [name] 10/15/2024andnbsp8:00 A M Medical Record Number: [id number] Patient Account Number: [id number] Date of Birth/Gender: Treating RN: 08/21/1940 (82 y.o. Erik [name] Primary Care Physician: Guerry Bruin Other Clinician: Referring Physician: Treating Physician/Extender: Wyline Beady in Treatment: 3 Education Assessment Education Provided To: Patient Education Topics Provided Wound/Skin Impairment: Handouts: Caring for Your Ulcer Methods: Explain/Verbal Responses: Reinforcements needed Electronic Signature(s) Signed: 03/12/2023 3:07:10 PM By: Shawn Stall RN, BSN Entered By: Shawn Stall on 03/11/2023 08:17:14 -------------------------------------------------------------------------------- Wound Assessment Details Patient Name: Date of Service: [name] 03/11/2023 8:00 A Erik [name] ([id number]) [id number] Page 8 of 9 Medical Record Number: [id number] Patient Account Number: [id number] Date of Birth/Sex: Treating RN: 1941-04-21 (82 y.o. Erik [name] Primary Care Margaurite Salido: Guerry Bruin Other Clinician: Referring Reno Clasby: Treating  Blayton Huttner/Extender: Wyline Beady in Treatment: 3 Wound Status Wound Number: 1 Primary Diabetic Wound/Ulcer of the Lower Extremity Etiology: Wound Location: Right Calcaneus Wound Open Wounding Event: Gradually Appeared Status: Date Acquired: 01/21/2023 Comorbid Anemia, Arrhythmia, Coronary Artery Disease, Hypertension, Weeks Of Treatment: 3 History: Peripheral Arterial Disease, Type II Diabetes, Neuropathy Clustered Wound: No Photos Wound Measurements Length: (cm) 0.5 Width: (cm) 0.4 Depth: (cm) 0.5 Area: (cm) 0.157 Volume: (cm) 0.079 % Reduction in Area: -11.3% % Reduction in Volume: -182.1% Epithelialization: Small (1-33%) Tunneling: No Undermining: Yes Starting Position (o'clock): 12 Ending Position (o'clock): 12 Maximum Distance: (cm) 1 Wound Description Classification: Grade 2 Wound Margin: Distinct, outline attached Exudate Amount: Medium Exudate Type: Serosanguineous Exudate Color: red, brown Foul Odor After Cleansing: No Slough/Fibrino Yes Wound Bed Granulation Amount: Medium (34-66%) Exposed Structure Granulation Quality: Red Fascia Exposed: No Necrotic Amount: Medium (34-66%) Fat Layer (Subcutaneous Tissue) Exposed: Yes Necrotic Quality: Adherent Slough Tendon Exposed: No Muscle Exposed: No Joint Exposed: No Bone Exposed: No Periwound Skin Texture Texture Color No Abnormalities Noted: No No Abnormalities Noted: No Callus: Yes Atrophie Blanche: No Crepitus: No Cyanosis: No Excoriation: No Ecchymosis: No Induration: No Erythema: No Rash: No Hemosiderin Staining: No Scarring: No Mottled: No Pallor: No Moisture Rubor: No No Abnormalities Noted: No Dry / Scaly: No Temperature / Pain Maceration: No Temperature: No Abnormality Tenderness on Palpation: Yes Treatment Notes Wound #1 (Calcaneus) Wound Laterality: Right [name], Erik ([id number]) [id number] Page 9 of 9 Cleanser Soap and  Water Discharge Instruction: May shower and wash wound with dial antibacterial soap and water prior to dressing change. Vashe 5.8 (oz) Discharge Instruction: Cleanse  Target Resolution Date: 04/26/2023 Goal Status: Active Interventions: Assess patient/caregiver ability to obtain necessary supplies Assess patient/caregiver ability to perform ulcer/skin care regimen upon admission and as needed Assess ulceration(s) every visit Provide education on ulcer and skin care Notes: Electronic Signature(s) Signed: 03/12/2023 3:07:10 PM By: Shawn Stall RN, BSN Entered By: Shawn Stall on 03/11/2023 08:16:52 -------------------------------------------------------------------------------- Pain Assessment Details Patient Name: Date of Service: [name] 03/11/2023 8:00 A M Medical Record Number: [id number] Patient Account Number:  [id number] Date of Birth/Sex: Treating RN: 05/23/1941 (82 y.o. Erik [name] Primary Care Delta Deshmukh: Guerry Bruin Other Clinician: Referring Charnay Nazario: Treating Wilfred Siverson/Extender: Wyline Beady in Treatment: 3 Active Problems Location of Pain Severity and Description of Pain SHOWN, DISSINGER ([id number]) [id number] Page 7 of 9 Patient Has Paino No Site Locations Pain Management and Medication Current Pain Management: Electronic Signature(s) Signed: 03/12/2023 3:07:10 PM By: Shawn Stall RN, BSN Entered By: Shawn Stall on 03/11/2023 08:14:07 -------------------------------------------------------------------------------- Patient/Caregiver Education Details Patient Name: Date of Service: TO Erik [name] 10/15/2024andnbsp8:00 A M Medical Record Number: [id number] Patient Account Number: [id number] Date of Birth/Gender: Treating RN: 08/21/1940 (82 y.o. Erik [name] Primary Care Physician: Guerry Bruin Other Clinician: Referring Physician: Treating Physician/Extender: Wyline Beady in Treatment: 3 Education Assessment Education Provided To: Patient Education Topics Provided Wound/Skin Impairment: Handouts: Caring for Your Ulcer Methods: Explain/Verbal Responses: Reinforcements needed Electronic Signature(s) Signed: 03/12/2023 3:07:10 PM By: Shawn Stall RN, BSN Entered By: Shawn Stall on 03/11/2023 08:17:14 -------------------------------------------------------------------------------- Wound Assessment Details Patient Name: Date of Service: [name] 03/11/2023 8:00 A Erik [name] ([id number]) [id number] Page 8 of 9 Medical Record Number: [id number] Patient Account Number: [id number] Date of Birth/Sex: Treating RN: 1941-04-21 (82 y.o. Erik [name] Primary Care Margaurite Salido: Guerry Bruin Other Clinician: Referring Reno Clasby: Treating  Blayton Huttner/Extender: Wyline Beady in Treatment: 3 Wound Status Wound Number: 1 Primary Diabetic Wound/Ulcer of the Lower Extremity Etiology: Wound Location: Right Calcaneus Wound Open Wounding Event: Gradually Appeared Status: Date Acquired: 01/21/2023 Comorbid Anemia, Arrhythmia, Coronary Artery Disease, Hypertension, Weeks Of Treatment: 3 History: Peripheral Arterial Disease, Type II Diabetes, Neuropathy Clustered Wound: No Photos Wound Measurements Length: (cm) 0.5 Width: (cm) 0.4 Depth: (cm) 0.5 Area: (cm) 0.157 Volume: (cm) 0.079 % Reduction in Area: -11.3% % Reduction in Volume: -182.1% Epithelialization: Small (1-33%) Tunneling: No Undermining: Yes Starting Position (o'clock): 12 Ending Position (o'clock): 12 Maximum Distance: (cm) 1 Wound Description Classification: Grade 2 Wound Margin: Distinct, outline attached Exudate Amount: Medium Exudate Type: Serosanguineous Exudate Color: red, brown Foul Odor After Cleansing: No Slough/Fibrino Yes Wound Bed Granulation Amount: Medium (34-66%) Exposed Structure Granulation Quality: Red Fascia Exposed: No Necrotic Amount: Medium (34-66%) Fat Layer (Subcutaneous Tissue) Exposed: Yes Necrotic Quality: Adherent Slough Tendon Exposed: No Muscle Exposed: No Joint Exposed: No Bone Exposed: No Periwound Skin Texture Texture Color No Abnormalities Noted: No No Abnormalities Noted: No Callus: Yes Atrophie Blanche: No Crepitus: No Cyanosis: No Excoriation: No Ecchymosis: No Induration: No Erythema: No Rash: No Hemosiderin Staining: No Scarring: No Mottled: No Pallor: No Moisture Rubor: No No Abnormalities Noted: No Dry / Scaly: No Temperature / Pain Maceration: No Temperature: No Abnormality Tenderness on Palpation: Yes Treatment Notes Wound #1 (Calcaneus) Wound Laterality: Right [name], Erik ([id number]) [id number] Page 9 of 9 Cleanser Soap and  Water Discharge Instruction: May shower and wash wound with dial antibacterial soap and water prior to dressing change. Vashe 5.8 (oz) Discharge Instruction: Cleanse  Clinician: Referring Willamae Demby: Treating Nori Winegar/Extender: Wyline Beady in Treatment: 3 Vital Signs Height(in): Capillary Blood Glucose(mg/dl): 85 Weight(lbs): Pulse(bpm): 67 Body Mass Index(BMI): Blood Pressure(mmHg): 141/53 Temperature(F): 98 Respiratory Rate(breaths/min): 20 [1:Photos:] [N/A:N/A] Right Calcaneus N/A N/A Wound Location: Gradually Appeared N/A N/A Wounding Event: Diabetic Wound/Ulcer of the Lower N/A N/A Primary Etiology: Extremity Anemia, Arrhythmia, Coronary Artery N/A N/A Comorbid History: Disease, Hypertension, Peripheral Arterial Disease, Type II Diabetes, Neuropathy 01/21/2023 N/A N/A Date Acquired: 3 N/A N/A Weeks of Treatment: Open N/A N/A Wound Status: No N/A N/A Wound Recurrence: 0.5x0.4x0.5 N/A N/A Measurements L x W x D (cm) 0.157 N/A N/A A (cm) : rea 0.079 N/A N/A Volume (cm) : [name] ([id number]) [id number] Page 5 of 9 -11.30% N/A N/A % Reduction in A rea: -182.10% N/A N/A % Reduction in Volume: 12 Starting Position 1 (o'clock): 12 Ending Position 1 (o'clock): 1 Maximum Distance 1 (cm): Yes N/A N/A Undermining: Grade 2 N/A N/A Classification: Medium N/A N/A Exudate A mount: Serosanguineous N/A  N/A Exudate Type: red, brown N/A N/A Exudate Color: Distinct, outline attached N/A N/A Wound Margin: Medium (34-66%) N/A N/A Granulation A mount: Red N/A N/A Granulation Quality: Medium (34-66%) N/A N/A Necrotic A mount: Fat Layer (Subcutaneous Tissue): Yes N/A N/A Exposed Structures: Fascia: No Tendon: No Muscle: No Joint: No Bone: No Small (1-33%) N/A N/A Epithelialization: Callus: Yes N/A N/A Periwound Skin Texture: Excoriation: No Induration: No Crepitus: No Rash: No Scarring: No Maceration: No N/A N/A Periwound Skin Moisture: Dry/Scaly: No Atrophie Blanche: No N/A N/A Periwound Skin Color: Cyanosis: No Ecchymosis: No Erythema: No Hemosiderin Staining: No Mottled: No Pallor: No Rubor: No No Abnormality N/A N/A Temperature: Yes N/A N/A Tenderness on Palpation: Treatment Notes Wound #1 (Calcaneus) Wound Laterality: Right Cleanser Soap and Water Discharge Instruction: May shower and wash wound with dial antibacterial soap and water prior to dressing change. Vashe 5.8 (oz) Discharge Instruction: Cleanse the wound with Vashe prior to applying a clean dressing using gauze sponges, not tissue or cotton balls. Peri-Wound Care Triamcinolone 15 (g) Discharge Instruction: Use triamcinolone 15 (g) as directed Sween Lotion (Moisturizing lotion) Discharge Instruction: Apply moisturizing lotion as directed Topical Mupirocin Ointment Discharge Instruction: Apply Mupirocin (Bactroban) as instructed Primary Dressing Maxorb Extra Ag+ Alginate Dressing, 2x2 (in/in) Discharge Instruction: Apply to wound bed as instructed Secondary Dressing ALLEVYN Heel 4 1/2in x 5 1/2in / 10.5cm x 13.5cm Discharge Instruction: Apply over primary dressing as directed. Woven Gauze Sponge, Non-Sterile 4x4 in Discharge Instruction: Apply over primary dressing as directed. Secured With [employer] 4x5 (in/yd) Discharge Instruction: Secure with Coban as directed. Kerlix  Roll Sterile, 4.5x3.1 (in/yd) Discharge Instruction: Secure with Kerlix as directed. Compression Wrap Compression Stockings JONATHEN, RATHMAN ([id number]) [id number] Page 6 of 9 Add-Ons Electronic Signature(s) Signed: 03/11/2023 4:40:46 PM By: [name] MD Entered By: [name] on 03/11/2023 08:35:41 -------------------------------------------------------------------------------- Multi-Disciplinary Care Plan Details Patient Name: Date of Service: TO [name], [name] 03/11/2023 8:00 A M Medical Record Number: [id number] Patient Account Number: [id number] Date of Birth/Sex: Treating RN: Mar 27, 1941 (82 y.o. Erik [name] Primary Care Kalen Ratajczak: Guerry Bruin Other Clinician: Referring Millenia Waldvogel: Treating Lundon Verdejo/Extender: Wyline Beady in Treatment: 3 Active Inactive Wound/Skin Impairment Nursing Diagnoses: Impaired tissue integrity Knowledge deficit related to ulceration/compromised skin integrity Goals: Patient/caregiver will verbalize understanding of skin care regimen Date Initiated: 02/18/2023 Target Resolution Date: 04/04/2023 Goal Status: Active Ulcer/skin breakdown will have a volume reduction of 30% by week 4 Date Initiated: 02/18/2023  TYJAI, MATUSZAK ([id number]) [id number] Page 1 of 9 Visit Report for 03/11/2023 Arrival Information Details Patient Name: Date of Service: TO [name] NK W. 03/11/2023 8:00 A M Medical Record Number: [id number] Patient Account Number: [id number] Date of Birth/Sex: Treating RN: May 27, 1941 (82 y.o. Erik [name] Primary Care Jagger Demonte: Guerry Bruin Other Clinician: Referring Terryl Molinelli: Treating Daleah Coulson/Extender: Wyline Beady in Treatment: 3 Visit Information History Since Last Visit Added or deleted any medications: No Patient Arrived: Ambulatory Any new allergies or adverse reactions: No Arrival Time: 08:05 Had a fall or experienced change in No Accompanied By: wife activities of daily living that may affect Transfer Assistance: None risk of falls: Patient Identification Verified: Yes Signs or symptoms of abuse/neglect since last visito No Secondary Verification Process Completed: Yes Hospitalized since last visit: No Patient Requires Transmission-Based Precautions: No Implantable device outside of the clinic excluding No Patient Has Alerts: Yes cellular tissue based products placed in the center Patient Alerts: ABI: R 1.08 11/14/22 since last visit: Has Dressing in Place as Prescribed: Yes Has Compression in Place as Prescribed: Yes Has Footwear/Offloading in Place as Prescribed: Yes Right: Wedge Shoe Pain Present Now: No Electronic Signature(s) Signed: 03/12/2023 3:07:10 PM By: Shawn Stall RN, BSN Entered By: Shawn Stall on 03/11/2023 08:47:26 -------------------------------------------------------------------------------- Clinic Level of Care Assessment Details Patient Name: Date of Service: [name] 03/11/2023 8:00 A M Medical Record Number: [id number] Patient Account Number: [id number] Date of Birth/Sex: Treating RN: [birth year] (82 y.o. Erik [name] Primary Care Layza Summa: Guerry Bruin  Other Clinician: Referring Kathaleya Mcduffee: Treating Tynika Luddy/Extender: Wyline Beady in Treatment: 3 Clinic Level of Care Assessment Items TOOL 4 Quantity Score X- 1 0 Use when only an EandM is performed on FOLLOW-UP visit ASSESSMENTS - Nursing Assessment / Reassessment X- 1 10 Reassessment of Co-morbidities (includes updates in patient status) X- 1 5 Reassessment of Adherence to Treatment Plan ASSESSMENTS - Wound and Skin A ssessment / Reassessment X - Simple Wound Assessment / Reassessment - one wound 1 5 []  - 0 Complex Wound Assessment / Reassessment - multiple wounds []  - 0 Dermatologic / Skin Assessment (not related to wound area) ASSESSMENTS - Focused Assessment X- 1 5 Circumferential Edema Measurements - multi extremities KENTARIUS, PARTINGTON ([id number]) [id number] Page 2 of 9 []  - 0 Nutritional Assessment / Counseling / Intervention []  - 0 Lower Extremity Assessment (monofilament, tuning fork, pulses) []  - 0 Peripheral Arterial Disease Assessment (using hand held doppler) ASSESSMENTS - Ostomy and/or Continence Assessment and Care []  - 0 Incontinence Assessment and Management []  - 0 Ostomy Care Assessment and Management (repouching, etc.) PROCESS - Coordination of Care X - Simple Patient / Family Education for ongoing care 1 15 []  - 0 Complex (extensive) Patient / Family Education for ongoing care X- 1 10 Staff obtains Chiropractor, Records, T Results / Process Orders est []  - 0 Staff telephones HHA, Nursing Homes / Clarify orders / etc []  - 0 Routine Transfer to another Facility (non-emergent condition) []  - 0 Routine Hospital Admission (non-emergent condition) []  - 0 New Admissions / Manufacturing engineer / Ordering NPWT Apligraf, etc. , []  - 0 Emergency Hospital Admission (emergent condition) X- 1 10 Simple Discharge Coordination []  - 0 Complex (extensive) Discharge Coordination PROCESS - Special Needs []  -  0 Pediatric / Minor Patient Management []  - 0 Isolation Patient Management []  - 0 Hearing / Language / Visual special needs []  - 0 Assessment of Community assistance (transportation, D/C planning, etc.) []  -  Target Resolution Date: 04/26/2023 Goal Status: Active Interventions: Assess patient/caregiver ability to obtain necessary supplies Assess patient/caregiver ability to perform ulcer/skin care regimen upon admission and as needed Assess ulceration(s) every visit Provide education on ulcer and skin care Notes: Electronic Signature(s) Signed: 03/12/2023 3:07:10 PM By: Shawn Stall RN, BSN Entered By: Shawn Stall on 03/11/2023 08:16:52 -------------------------------------------------------------------------------- Pain Assessment Details Patient Name: Date of Service: [name] 03/11/2023 8:00 A M Medical Record Number: [id number] Patient Account Number:  [id number] Date of Birth/Sex: Treating RN: 05/23/1941 (82 y.o. Erik [name] Primary Care Delta Deshmukh: Guerry Bruin Other Clinician: Referring Charnay Nazario: Treating Wilfred Siverson/Extender: Wyline Beady in Treatment: 3 Active Problems Location of Pain Severity and Description of Pain SHOWN, DISSINGER ([id number]) [id number] Page 7 of 9 Patient Has Paino No Site Locations Pain Management and Medication Current Pain Management: Electronic Signature(s) Signed: 03/12/2023 3:07:10 PM By: Shawn Stall RN, BSN Entered By: Shawn Stall on 03/11/2023 08:14:07 -------------------------------------------------------------------------------- Patient/Caregiver Education Details Patient Name: Date of Service: TO Erik [name] 10/15/2024andnbsp8:00 A M Medical Record Number: [id number] Patient Account Number: [id number] Date of Birth/Gender: Treating RN: 08/21/1940 (82 y.o. Erik [name] Primary Care Physician: Guerry Bruin Other Clinician: Referring Physician: Treating Physician/Extender: Wyline Beady in Treatment: 3 Education Assessment Education Provided To: Patient Education Topics Provided Wound/Skin Impairment: Handouts: Caring for Your Ulcer Methods: Explain/Verbal Responses: Reinforcements needed Electronic Signature(s) Signed: 03/12/2023 3:07:10 PM By: Shawn Stall RN, BSN Entered By: Shawn Stall on 03/11/2023 08:17:14 -------------------------------------------------------------------------------- Wound Assessment Details Patient Name: Date of Service: [name] 03/11/2023 8:00 A Erik [name] ([id number]) [id number] Page 8 of 9 Medical Record Number: [id number] Patient Account Number: [id number] Date of Birth/Sex: Treating RN: 1941-04-21 (82 y.o. Erik [name] Primary Care Margaurite Salido: Guerry Bruin Other Clinician: Referring Reno Clasby: Treating  Blayton Huttner/Extender: Wyline Beady in Treatment: 3 Wound Status Wound Number: 1 Primary Diabetic Wound/Ulcer of the Lower Extremity Etiology: Wound Location: Right Calcaneus Wound Open Wounding Event: Gradually Appeared Status: Date Acquired: 01/21/2023 Comorbid Anemia, Arrhythmia, Coronary Artery Disease, Hypertension, Weeks Of Treatment: 3 History: Peripheral Arterial Disease, Type II Diabetes, Neuropathy Clustered Wound: No Photos Wound Measurements Length: (cm) 0.5 Width: (cm) 0.4 Depth: (cm) 0.5 Area: (cm) 0.157 Volume: (cm) 0.079 % Reduction in Area: -11.3% % Reduction in Volume: -182.1% Epithelialization: Small (1-33%) Tunneling: No Undermining: Yes Starting Position (o'clock): 12 Ending Position (o'clock): 12 Maximum Distance: (cm) 1 Wound Description Classification: Grade 2 Wound Margin: Distinct, outline attached Exudate Amount: Medium Exudate Type: Serosanguineous Exudate Color: red, brown Foul Odor After Cleansing: No Slough/Fibrino Yes Wound Bed Granulation Amount: Medium (34-66%) Exposed Structure Granulation Quality: Red Fascia Exposed: No Necrotic Amount: Medium (34-66%) Fat Layer (Subcutaneous Tissue) Exposed: Yes Necrotic Quality: Adherent Slough Tendon Exposed: No Muscle Exposed: No Joint Exposed: No Bone Exposed: No Periwound Skin Texture Texture Color No Abnormalities Noted: No No Abnormalities Noted: No Callus: Yes Atrophie Blanche: No Crepitus: No Cyanosis: No Excoriation: No Ecchymosis: No Induration: No Erythema: No Rash: No Hemosiderin Staining: No Scarring: No Mottled: No Pallor: No Moisture Rubor: No No Abnormalities Noted: No Dry / Scaly: No Temperature / Pain Maceration: No Temperature: No Abnormality Tenderness on Palpation: Yes Treatment Notes Wound #1 (Calcaneus) Wound Laterality: Right [name], Erik ([id number]) [id number] Page 9 of 9 Cleanser Soap and  Water Discharge Instruction: May shower and wash wound with dial antibacterial soap and water prior to dressing change. Vashe 5.8 (oz) Discharge Instruction: Cleanse

## 2023-03-13 ENCOUNTER — Ambulatory Visit (HOSPITAL_BASED_OUTPATIENT_CLINIC_OR_DEPARTMENT_OTHER): Payer: PPO | Admitting: Internal Medicine

## 2023-03-13 LAB — AEROBIC CULTURE W GRAM STAIN (SUPERFICIAL SPECIMEN): Gram Stain: NONE SEEN

## 2023-03-17 DIAGNOSIS — N183 Chronic kidney disease, stage 3 unspecified: Secondary | ICD-10-CM | POA: Diagnosis not present

## 2023-03-20 ENCOUNTER — Encounter (HOSPITAL_BASED_OUTPATIENT_CLINIC_OR_DEPARTMENT_OTHER): Payer: PPO | Admitting: Internal Medicine

## 2023-03-20 DIAGNOSIS — L97412 Non-pressure chronic ulcer of right heel and midfoot with fat layer exposed: Secondary | ICD-10-CM | POA: Diagnosis not present

## 2023-03-20 DIAGNOSIS — E11621 Type 2 diabetes mellitus with foot ulcer: Secondary | ICD-10-CM | POA: Diagnosis not present

## 2023-03-20 NOTE — Progress Notes (Signed)
Patient name: Erik [name] MRN: [id number] DOB: Oct 05, 1940 Sex: male  REASON FOR CONSULT: Right heel ulcer  HPI: Erik [name] is a 82 y.o. male, with history of hypertension, diabetes, A-fib, CKD, CHF that presents for evaluation of right heel ulcer.  He states his right heel ulcer has been present for about 6 to 7 months.  He does not know what caused it.  No associated trauma.  He was initially followed by Dr. Loreta Ave with podiatry but is now going to the wound clinic.  He denies any previous vascular interventions.  He is ambulatory.  Past Medical History:  Diagnosis Date   Chronic combined systolic and diastolic CHF (congestive heart failure) (HCC)    CKD (chronic kidney disease), stage III (HCC)    Coronary artery disease    a. Nonobstructive by cath 2004. b. Nuc 09/2013 - no ischemia, fixed defect suggestive of possible prior infarction, felt low risk, EF 48%.    Diabetes mellitus    GERD (gastroesophageal reflux disease)    Hyperlipidemia    Hypertension    NICM (nonischemic cardiomyopathy) (HCC)    a. EF 35-40% in 2009, 55% in 2010. b. 09/2013: 35-40% by echo and 48% by nuc. c. EF 50-55% by echo 12/2014.   PAD (peripheral artery disease) (HCC)    a. mild by noninvasive testing.   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    Premature ventricular contractions    Thrombocytopenia (HCC)    Trigeminal neuralgia     Past Surgical History:  Procedure Laterality Date   CARDIAC CATHETERIZATION  10/25/00, 12/29/02   CHOLECYSTECTOMY N/A 01/25/2015   Procedure: LAPAROSCOPIC CHOLECYSTECTOMY ;  Surgeon: Abigail Miyamoto, MD;  Location: [facility] OR;  Service: General;  Laterality: N/A;   SHOULDER SURGERY Left 2011   Shoulder surgery, rod inserted    Family History  Problem Relation Age of Onset   Heart disease Mother    Diabetes Mother    Hypertension Mother    Heart disease Father    Heart disease Brother    Diabetes Brother     Hypertension Brother    Heart attack Neg Hx    Stroke Neg Hx     SOCIAL HISTORY: Social History   Socioeconomic History   Marital status: Married    Spouse name: Not on file   Number of children: Not on file   Years of education: Not on file   Highest education level: Not on file  Occupational History   Occupation: retired    Comment: from maintenance  Tobacco Use   Smoking status: Former    Current packs/day: 0.00    Average packs/day: 0.5 packs/day for 10.0 years (5.0 ttl pk-yrs)    Types: Cigarettes    Start date: 01/22/1975    Quit date: 01/21/1985    Years since quitting: 38.1   Smokeless tobacco: Never   Tobacco comments:    quit 35 yrs ago  Vaping Use   Vaping status: Never Used  Substance and Sexual Activity   Alcohol use: No   Drug use: No   Sexual activity: Not Currently    Birth control/protection: None  Other Topics Concern   Not on file  Social History Narrative   Lives with wife in a one story home.  Has 3 children.  Retired from IT trainer at [employer].     Education: high school.      Right Handed  Lives in a one story    Social Determinants of Health   Financial Resource Strain: Not on file  Food Insecurity: No Food Insecurity (02/25/2020)   Hunger Vital Sign    Worried About Running Out of Food in the Last Year: Never true    Ran Out of Food in the Last Year: Never true  Transportation Needs: Not on file  Physical Activity: Not on file  Stress: Not on file  Social Connections: Not on file  Intimate Partner Violence: Not on file    Allergies  Allergen Reactions   Claritin [Loratadine] Other (See Comments)    Urinary retention   Tradjenta [Linagliptin] Other (See Comments)    High blood sugar   Imipramine     Other reaction(s): hallucination/dizziness    Current Outpatient Medications  Medication Sig Dispense Refill   acetaminophen (TYLENOL) 325 MG tablet Take 2 tablets (650 mg total) by mouth every 6 (six) hours as  needed for mild pain (or Fever >/= 101).     atorvastatin (LIPITOR) 40 MG tablet Take 1 tablet by mouth daily.     Baclofen 5 MG TABS Take 1 tablet (5 mg total) by mouth at bedtime as needed. 10 tablet 0   BD PEN NEEDLE NANO U/F 32G X 4 MM MISC USE 1 NEEDLE WITH INSULIN INJECTION ONCE DAILY  2   Blood Glucose Calibration (TRUE METRIX LEVEL 1) Low SOLN USE TO CALIBRATE GLUCOMETER ONCE MONTHY OR WITH EACH NEW VIAL OF STRIPS, WHICHEVER COMES FIRST     Blood Glucose Monitoring Suppl (TRUE METRIX AIR GLUCOSE METER) w/Device KIT use to check blood sugar three times daily prior to meals. E11.29     Coenzyme Q10 (COQ10) 200 MG CAPS Take 100 mg by mouth daily.      Continuous Blood Gluc Receiver (FREESTYLE LIBRE 2 READER) DEVI See admin instructions.     Continuous Blood Gluc Sensor (FREESTYLE LIBRE 2 SENSOR) MISC Inject into the skin every 14 (fourteen) days.     dapagliflozin propanediol (FARXIGA) 10 MG TABS tablet Take 1 tablet (10 mg total) by mouth daily before breakfast. 30 tablet 6   doxycycline (VIBRA-TABS) 100 MG tablet Take 1 tablet (100 mg total) by mouth 2 (two) times daily. (Patient not taking: Reported on 02/04/2023) 14 tablet 0   finasteride (PROSCAR) 5 MG tablet Take 5 mg by mouth once daily  3   furosemide (LASIX) 40 MG tablet Take 1 tablet (40 mg total) by mouth daily 30 tablet 5   gabapentin (NEURONTIN) 300 MG capsule Take 1 capsule (300 mg total) by mouth 2 (two) times daily. 180 capsule 3   glucose blood (TRUE METRIX BLOOD GLUCOSE TEST) test strip use to check blood sugar three times daily prior to meals. E11.29     ipratropium (ATROVENT) 0.03 % nasal spray      lidocaine (LIDODERM) 5 % Place 1 patch onto the skin daily. Remove & Discard patch within 12 hours or as directed by MD 30 patch 0   metoprolol succinate (TOPROL XL) 50 MG 24 hr tablet Take 1.5 tablets (75 mg total) by mouth daily. Take with or immediately following a meal. 135 tablet 1   moxifloxacin (VIGAMOX) 0.5 % ophthalmic  solution Place 1 drop into the left eye 4 (four) times daily.     Multiple Vitamin (MULTIVITAMIN) capsule Take 1 capsule by mouth daily.     OXcarbazepine (TRILEPTAL) 150 MG tablet Take 1 tablet (150 mg total) by mouth 2 (two) times daily. 60  tablet 3   pantoprazole (PROTONIX) 20 MG tablet Take 20 mg by mouth daily.     potassium chloride SA (KLOR-CON M) 20 MEQ tablet Take 1 tablet (20 mEq total) by mouth daily for 5 days only.  Take in concurrence with 5 day increased lasix. 5 tablet 0   PROLENSA 0.07 % SOLN      Rivaroxaban (XARELTO) 15 MG TABS tablet TAKE 1 TABLET(15 MG) BY MOUTH DAILY WITH SUPPER 90 tablet 1   selenium 200 MCG TABS tablet Take 200 mcg by mouth daily.     silver sulfADIAZINE (SILVADENE) 1 % cream Apply 1 Application topically daily. 50 g 0   tamsulosin (FLOMAX) 0.4 MG CAPS capsule Take 0.4 mg by mouth at bedtime.     TOUJEO SOLOSTAR 300 UNIT/ML SOPN Inject 36 Units as directed daily.   4   Trospium Chloride 60 MG CP24      TRUEplus Lancets 33G MISC use to check blood sugar three times daily prior to meals. E11.29     No current facility-administered medications for this visit.    REVIEW OF SYSTEMS:  [X]  denotes positive finding, [ ]  denotes negative finding Cardiac  Comments:  Chest pain or chest pressure:    Shortness of breath upon exertion:    Short of breath when lying flat:    Irregular heart rhythm:        Vascular    Pain in calf, thigh, or hip brought on by ambulation:    Pain in feet at night that wakes you up from your sleep:     Blood clot in your veins:    Leg swelling:         Pulmonary    Oxygen at home:    Productive cough:     Wheezing:         Neurologic    Sudden weakness in arms or legs:     Sudden numbness in arms or legs:     Sudden onset of difficulty speaking or slurred speech:    Temporary loss of vision in one eye:     Problems with dizziness:         Gastrointestinal    Blood in stool:     Vomited blood:          Genitourinary    Burning when urinating:     Blood in urine:        Psychiatric    Major depression:         Hematologic    Bleeding problems:    Problems with blood clotting too easily:        Skin    Rashes or ulcers:        Constitutional    Fever or chills:      PHYSICAL EXAM: There were no vitals filed for this visit.  GENERAL: The patient is a well-nourished male, in no acute distress. The vital signs are documented above. CARDIAC: There is a regular rate and rhythm.  VASCULAR:  Right femoral pulse palpable Right popliteal pulse palpable Right DP palpable Right heel ulcer as pictured PULMONARY: No respiratory distress. ABDOMEN: Soft and non-tender. MUSCULOSKELETAL: There are no major deformities or cyanosis. NEUROLOGIC: No focal weakness or paresthesias are detected. PSYCHIATRIC: The patient has a normal affect.    DATA:   ABIs 11/14/2022 are 1.08 on the right triphasic and 0.99 on the left triphasic  Assessment/Plan:  82 y.o. male, with history of hypertension, diabetes, A-fib, CKD, CHF that presents for  evaluation of right heel ulcer.  I discussed that he has a palpable dorsalis pedis pulse on exam as well as a normal ABI of 1.08 with a triphasic waveform at the ankle.  I suspect he has enough inflow to heal his ulcer as pictured.  In addition he has pulsatile toe tracings with a toe pressure of 83 that should be more than adequate for wound healing.  I discussed typically want a toe pressure of 60-80 to allow adequate wound healing.  I will plan to see him in 6 to 8 weeks for wound check.  I discussed if his wound gets worse we could always pursue angiography in the future but again I think he has adequate inflow with normal noninvasive studies and a palpable pulse.   Cephus Shelling, MD [facility] Office: [phone]

## 2023-03-21 ENCOUNTER — Encounter: Payer: Self-pay | Admitting: Vascular Surgery

## 2023-03-21 ENCOUNTER — Ambulatory Visit: Payer: PPO | Admitting: Vascular Surgery

## 2023-03-21 VITALS — BP 138/57 | HR 65 | Temp 98.1°F | Ht 71.0 in | Wt 193.0 lb

## 2023-03-21 DIAGNOSIS — I739 Peripheral vascular disease, unspecified: Secondary | ICD-10-CM | POA: Diagnosis not present

## 2023-03-21 NOTE — Progress Notes (Addendum)
N/A Wound Recurrence: 0.5x0.4x0.5 N/A N/A Measurements L x W x D (cm) 0.157 N/A N/A A (cm) : rea 0.079 N/A N/A Volume (cm) : -11.30% N/A N/A % Reduction in A rea: -182.10% N/A N/A % Reduction in Volume: Grade 2 N/A N/A Classification: Medium N/A N/A Exudate A mount: Serosanguineous N/A N/A Exudate Type: red, brown N/A N/A Exudate Color: Distinct, outline attached N/A N/A Wound Margin: Small (1-33%) N/A N/A Granulation A mount: Red N/A N/A Granulation Quality: Large (67-100%) N/A N/A Necrotic A mount: Fat Layer (Subcutaneous Tissue): Yes N/A N/A Exposed Structures: Fascia: No Tendon: No Muscle: No Joint: No Bone: No Small (1-33%) N/A N/A Epithelialization: Debridement - Excisional N/A N/A Debridement: Pre-procedure Verification/Time Out 11:40 N/A N/A Taken: Lidocaine 5% topical ointment N/A N/A Pain Control: Callus, Subcutaneous N/A N/A Tissue Debrided: Skin/Subcutaneous Tissue N/A N/A Level: 0.16 N/A N/A Debridement A (sq cm): rea Curette N/A N/A Instrument: Minimum N/A N/A Bleeding: Silver Nitrate N/A N/A Hemostasis A chieved: Procedure  was tolerated well N/A N/A Debridement Treatment Response: 0.5x0.4x0.5 N/A N/A Post Debridement Measurements L x W x D (cm) 0.079 N/A N/A Post Debridement Volume: (cm) Callus: Yes N/A N/A Periwound Skin Texture: Excoriation: No Induration: No Crepitus: No Rash: No Scarring: No Maceration: No N/A N/A Periwound Skin Moisture: Dry/Scaly: No Atrophie Blanche: No N/A N/A Periwound Skin Color: Cyanosis: No Ecchymosis: No Erythema: No Hemosiderin Staining: No Mottled: No Pallor: No Rubor: No No Abnormality N/A N/A Temperature: Yes N/A N/A Tenderness on Palpation: Debridement N/A N/A Procedures Performed: Treatment Notes Electronic Signature(s) Signed: 03/21/2023 12:59:16 PM By: [name] MD Entered By: [name] on 03/20/2023 09:08:36 -------------------------------------------------------------------------------- Multi-Disciplinary Care Plan Details Patient Name: Date of Service: TO [name], Erik Presto NK W. 03/20/2023 10:30 A M Medical Record Number: [id number] Patient Account Number: [id number] Erik [name], Erik [name] ([id number]) [id number] Page 4 of 7 Date of Birth/Sex: Treating RN: 1941/02/11 (82 y.o. Erik [name] Primary Care Ryszard Socarras: Guerry Bruin Other Clinician: Referring Vielka Klinedinst: Treating Jahseh Lucchese/Extender: Wyline Beady in Treatment: 4 Active Inactive Wound/Skin Impairment Nursing Diagnoses: Impaired tissue integrity Knowledge deficit related to ulceration/compromised skin integrity Goals: Patient/caregiver will verbalize understanding of skin care regimen Date Initiated: 02/18/2023 Target Resolution Date: 05/04/2023 Goal Status: Active Ulcer/skin breakdown will have a volume reduction of 30% by week 4 Date Initiated: 02/18/2023 Target Resolution Date: 05/26/2023 Goal Status: Active Interventions: Assess patient/caregiver ability to obtain necessary supplies Assess patient/caregiver ability to  perform ulcer/skin care regimen upon admission and as needed Assess ulceration(s) every visit Provide education on ulcer and skin care Notes: Electronic Signature(s) Signed: 03/21/2023 2:33:15 PM By: Karie Schwalbe RN Entered By: Karie Schwalbe on 03/21/2023 11:25:37 -------------------------------------------------------------------------------- Pain Assessment Details Patient Name: Date of Service: [name] 03/20/2023 10:30 A M Medical Record Number: [id number] Patient Account Number: [id number] Date of Birth/Sex: Treating RN: [birth year] (82 y.o. M) Primary Care Topaz Raglin: Guerry Bruin Other Clinician: Referring Yuvonne Lanahan: Treating Manie Bealer/Extender: Wyline Beady in Treatment: 4 Active Problems Location of Pain Severity and Description of Pain Patient Has Paino No Site Locations Erik [name], Erik [name] ([id number]) [phone].pdf Page 5 of 7 Pain Management and Medication Current Pain Management: Electronic Signature(s) Signed: 03/20/2023 4:59:00 PM By: Thayer Dallas Entered By: Thayer Dallas on 03/20/2023 08:07:50 -------------------------------------------------------------------------------- Patient/Caregiver Education Details Patient Name: Date of Service: TO Erik [name] 10/24/2024andnbsp10:30 A M Medical Record Number: [id number] Patient Account Number: [id number] Date of Birth/Gender: Treating RN: 04-Jul-1940 (82 y.o. Erik [name] Primary Care Physician: Wylene Simmer,  N/A Wound Recurrence: 0.5x0.4x0.5 N/A N/A Measurements L x W x D (cm) 0.157 N/A N/A A (cm) : rea 0.079 N/A N/A Volume (cm) : -11.30% N/A N/A % Reduction in A rea: -182.10% N/A N/A % Reduction in Volume: Grade 2 N/A N/A Classification: Medium N/A N/A Exudate A mount: Serosanguineous N/A N/A Exudate Type: red, brown N/A N/A Exudate Color: Distinct, outline attached N/A N/A Wound Margin: Small (1-33%) N/A N/A Granulation A mount: Red N/A N/A Granulation Quality: Large (67-100%) N/A N/A Necrotic A mount: Fat Layer (Subcutaneous Tissue): Yes N/A N/A Exposed Structures: Fascia: No Tendon: No Muscle: No Joint: No Bone: No Small (1-33%) N/A N/A Epithelialization: Debridement - Excisional N/A N/A Debridement: Pre-procedure Verification/Time Out 11:40 N/A N/A Taken: Lidocaine 5% topical ointment N/A N/A Pain Control: Callus, Subcutaneous N/A N/A Tissue Debrided: Skin/Subcutaneous Tissue N/A N/A Level: 0.16 N/A N/A Debridement A (sq cm): rea Curette N/A N/A Instrument: Minimum N/A N/A Bleeding: Silver Nitrate N/A N/A Hemostasis A chieved: Procedure  was tolerated well N/A N/A Debridement Treatment Response: 0.5x0.4x0.5 N/A N/A Post Debridement Measurements L x W x D (cm) 0.079 N/A N/A Post Debridement Volume: (cm) Callus: Yes N/A N/A Periwound Skin Texture: Excoriation: No Induration: No Crepitus: No Rash: No Scarring: No Maceration: No N/A N/A Periwound Skin Moisture: Dry/Scaly: No Atrophie Blanche: No N/A N/A Periwound Skin Color: Cyanosis: No Ecchymosis: No Erythema: No Hemosiderin Staining: No Mottled: No Pallor: No Rubor: No No Abnormality N/A N/A Temperature: Yes N/A N/A Tenderness on Palpation: Debridement N/A N/A Procedures Performed: Treatment Notes Electronic Signature(s) Signed: 03/21/2023 12:59:16 PM By: [name] MD Entered By: [name] on 03/20/2023 09:08:36 -------------------------------------------------------------------------------- Multi-Disciplinary Care Plan Details Patient Name: Date of Service: TO [name], Erik Presto NK W. 03/20/2023 10:30 A M Medical Record Number: [id number] Patient Account Number: [id number] Erik [name], Erik [name] ([id number]) [id number] Page 4 of 7 Date of Birth/Sex: Treating RN: 1941/02/11 (82 y.o. Erik [name] Primary Care Ryszard Socarras: Guerry Bruin Other Clinician: Referring Vielka Klinedinst: Treating Jahseh Lucchese/Extender: Wyline Beady in Treatment: 4 Active Inactive Wound/Skin Impairment Nursing Diagnoses: Impaired tissue integrity Knowledge deficit related to ulceration/compromised skin integrity Goals: Patient/caregiver will verbalize understanding of skin care regimen Date Initiated: 02/18/2023 Target Resolution Date: 05/04/2023 Goal Status: Active Ulcer/skin breakdown will have a volume reduction of 30% by week 4 Date Initiated: 02/18/2023 Target Resolution Date: 05/26/2023 Goal Status: Active Interventions: Assess patient/caregiver ability to obtain necessary supplies Assess patient/caregiver ability to  perform ulcer/skin care regimen upon admission and as needed Assess ulceration(s) every visit Provide education on ulcer and skin care Notes: Electronic Signature(s) Signed: 03/21/2023 2:33:15 PM By: Karie Schwalbe RN Entered By: Karie Schwalbe on 03/21/2023 11:25:37 -------------------------------------------------------------------------------- Pain Assessment Details Patient Name: Date of Service: [name] 03/20/2023 10:30 A M Medical Record Number: [id number] Patient Account Number: [id number] Date of Birth/Sex: Treating RN: [birth year] (82 y.o. M) Primary Care Topaz Raglin: Guerry Bruin Other Clinician: Referring Yuvonne Lanahan: Treating Manie Bealer/Extender: Wyline Beady in Treatment: 4 Active Problems Location of Pain Severity and Description of Pain Patient Has Paino No Site Locations Erik [name], Erik [name] ([id number]) [phone].pdf Page 5 of 7 Pain Management and Medication Current Pain Management: Electronic Signature(s) Signed: 03/20/2023 4:59:00 PM By: Thayer Dallas Entered By: Thayer Dallas on 03/20/2023 08:07:50 -------------------------------------------------------------------------------- Patient/Caregiver Education Details Patient Name: Date of Service: TO Erik [name] 10/24/2024andnbsp10:30 A M Medical Record Number: [id number] Patient Account Number: [id number] Date of Birth/Gender: Treating RN: 04-Jul-1940 (82 y.o. Erik [name] Primary Care Physician: Wylene Simmer,  AMAR, SAVIO ([id number]) [id number] Page 1 of 7 Visit Report for 03/20/2023 Arrival Information Details Patient Name: Date of Service: TO Erik [name]. 03/20/2023 10:30 A M Medical Record Number: [id number] Patient Account Number: [id number] Date of Birth/Sex: Treating RN: 1940-06-17 (82 y.o. M) Primary Care Duyen Beckom: Guerry Bruin Other Clinician: Referring Kwesi Sangha: Treating Jael Kostick/Extender: Wyline Beady in Treatment: 4 Visit Information History Since Last Visit Added or deleted any medications: No Patient Arrived: Ambulatory Any new allergies or adverse reactions: No Arrival Time: 11:05 Had a fall or experienced change in No Accompanied By: wife activities of daily living that may affect Transfer Assistance: None risk of falls: Patient Identification Verified: Yes Signs or symptoms of abuse/neglect since last visito No Secondary Verification Process Completed: Yes Hospitalized since last visit: No Patient Requires Transmission-Based Precautions: No Has Dressing in Place as Prescribed: Yes Patient Has Alerts: Yes Has Compression in Place as Prescribed: Yes Patient Alerts: ABI: R 1.08 11/14/22 Pain Present Now: No Electronic Signature(s) Signed: 03/20/2023 4:59:00 PM By: Thayer Dallas Entered By: Thayer Dallas on 03/20/2023 08:07:42 -------------------------------------------------------------------------------- Encounter Discharge Information Details Patient Name: Date of Service: [name] 03/20/2023 10:30 A M Medical Record Number: [id number] Patient Account Number: [id number] Date of Birth/Sex: Treating RN: [birth year] (82 y.o. Erik [name] Primary Care Reinette Cuneo: Guerry Bruin Other Clinician: Referring Javin Nong: Treating Alyric Parkin/Extender: Wyline Beady in Treatment: 4 Encounter Discharge Information Items Post Procedure Vitals Discharge Condition:  Stable Temperature (F): 98.9 Ambulatory Status: Cane Pulse (bpm): 80 Discharge Destination: Home Respiratory Rate (breaths/min): 18 Transportation: Private Auto Blood Pressure (mmHg): 153/70 Accompanied By: spouse Schedule Follow-up Appointment: No Clinical Summary of Care: Patient Declined Electronic Signature(s) Signed: 03/21/2023 2:33:15 PM By: Karie Schwalbe RN Entered By: Karie Schwalbe on 03/21/2023 11:27:55 Erik [name] ([id number]) [id number] Page 2 of 7 -------------------------------------------------------------------------------- Lower Extremity Assessment Details Patient Name: Date of Service: TO Erik [name]. 03/20/2023 10:30 A M Medical Record Number: [id number] Patient Account Number: [id number] Date of Birth/Sex: Treating RN: 1940/10/12 (82 y.o. M) Primary Care Ledger Heindl: Guerry Bruin Other Clinician: Referring Weylyn Ricciuti: Treating Javier Mamone/Extender: Wyline Beady in Treatment: 4 Edema Assessment Assessed: [name]: No] [Right: No] Edema: [Left: Ye] [Right: s] Calf Left: Right: Point of Measurement: From Medial Instep 37.1 cm Ankle Left: Right: Point of Measurement: From Medial Instep 26 cm Vascular Assessment Extremity colors, hair growth, and conditions: Extremity Color: [Right:Normal] Hair Growth on Extremity: [Right:No] Temperature of Extremity: [Right:Warm] Capillary Refill: [Right:< 3 seconds] Dependent Rubor: [Right:No Yes] Electronic Signature(s) Signed: 03/20/2023 4:59:00 PM By: Thayer Dallas Entered By: Thayer Dallas on 03/20/2023 08:30:30 -------------------------------------------------------------------------------- Multi Wound Chart Details Patient Name: Date of Service: [name] 03/20/2023 10:30 A M Medical Record Number: [id number] Patient Account Number: [id number] Date of Birth/Sex: Treating RN: 05/10/1941 (82 y.o. 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 03/20/2023 10:30 A M Medical Record Number: [id number] Patient Account Number: [id number] Date of Birth/Sex: Treating RN: 1940-06-17 (82 y.o. M) Primary Care Duyen Beckom: Guerry Bruin Other Clinician: Referring Kwesi Sangha: Treating Jael Kostick/Extender: Wyline Beady in Treatment: 4 Visit Information History Since Last Visit Added or deleted any medications: No Patient Arrived: Ambulatory Any new allergies or adverse reactions: No Arrival Time: 11:05 Had a fall or experienced change in No Accompanied By: wife activities of daily living that may affect Transfer Assistance: None risk of falls: Patient Identification Verified: Yes Signs or symptoms of abuse/neglect since last visito No Secondary Verification Process Completed: Yes Hospitalized since last visit: No Patient Requires Transmission-Based Precautions: No Has Dressing in Place as Prescribed: Yes Patient Has Alerts: Yes Has Compression in Place as Prescribed: Yes Patient Alerts: ABI: R 1.08 11/14/22 Pain Present Now: No Electronic Signature(s) Signed: 03/20/2023 4:59:00 PM By: Thayer Dallas Entered By: Thayer Dallas on 03/20/2023 08:07:42 -------------------------------------------------------------------------------- Encounter Discharge Information Details Patient Name: Date of Service: [name] 03/20/2023 10:30 A M Medical Record Number: [id number] Patient Account Number: [id number] Date of Birth/Sex: Treating RN: [birth year] (82 y.o. Erik [name] Primary Care Reinette Cuneo: Guerry Bruin Other Clinician: Referring Javin Nong: Treating Alyric Parkin/Extender: Wyline Beady in Treatment: 4 Encounter Discharge Information Items Post Procedure Vitals Discharge Condition:  Stable Temperature (F): 98.9 Ambulatory Status: Cane Pulse (bpm): 80 Discharge Destination: Home Respiratory Rate (breaths/min): 18 Transportation: Private Auto Blood Pressure (mmHg): 153/70 Accompanied By: spouse Schedule Follow-up Appointment: No Clinical Summary of Care: Patient Declined Electronic Signature(s) Signed: 03/21/2023 2:33:15 PM By: Karie Schwalbe RN Entered By: Karie Schwalbe on 03/21/2023 11:27:55 Erik [name] ([id number]) [id number] Page 2 of 7 -------------------------------------------------------------------------------- Lower Extremity Assessment Details Patient Name: Date of Service: TO Erik [name]. 03/20/2023 10:30 A M Medical Record Number: [id number] Patient Account Number: [id number] Date of Birth/Sex: Treating RN: 1940/10/12 (82 y.o. M) Primary Care Ledger Heindl: Guerry Bruin Other Clinician: Referring Weylyn Ricciuti: Treating Javier Mamone/Extender: Wyline Beady in Treatment: 4 Edema Assessment Assessed: [name]: No] [Right: No] Edema: [Left: Ye] [Right: s] Calf Left: Right: Point of Measurement: From Medial Instep 37.1 cm Ankle Left: Right: Point of Measurement: From Medial Instep 26 cm Vascular Assessment Extremity colors, hair growth, and conditions: Extremity Color: [Right:Normal] Hair Growth on Extremity: [Right:No] Temperature of Extremity: [Right:Warm] Capillary Refill: [Right:< 3 seconds] Dependent Rubor: [Right:No Yes] Electronic Signature(s) Signed: 03/20/2023 4:59:00 PM By: Thayer Dallas Entered By: Thayer Dallas on 03/20/2023 08:30:30 -------------------------------------------------------------------------------- Multi Wound Chart Details Patient Name: Date of Service: [name] 03/20/2023 10:30 A M Medical Record Number: [id number] Patient Account Number: [id number] Date of Birth/Sex: Treating RN: 05/10/1941 (82 y.o. 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

## 2023-03-21 NOTE — Progress Notes (Signed)
or above for 30 minutes daily and/or when sitting for 3-4 times a day throughout the day. Avoid standing for long periods of time. Off-Loading Total Contact Cast to Right Lower Extremity - Prepare for first TCC on 03/25/23 wear loose leg pants then return 02/3123 for TCC change, after initial placement Wedge shoe to: - heel offloading shoe- wear while walking and standing. Mininize walking and standing. Wound Treatment Wound #1 - Calcaneus Wound Laterality: Right Cleanser: Soap and Water 1 x Per Week/30 Days Discharge Instructions: May shower and wash wound with dial antibacterial soap and water prior to dressing change. Cleanser: Vashe 5.8 (oz) 1 x Per Week/30 Days Discharge Instructions: Cleanse the wound with Vashe prior to applying a clean dressing using gauze sponges, not tissue or cotton balls. Peri-Wound Care: Triamcinolone 15 (g) 1 x Per Week/30 Days Discharge Instructions: Use triamcinolone 15 (g) as directed Peri-Wound Care: Sween Lotion (Moisturizing lotion) 1 x Per Week/30 Days Discharge Instructions: Apply moisturizing lotion as directed Topical: Mupirocin Ointment 1 x Per Week/30 Days Discharge Instructions: Apply Mupirocin (Bactroban) as instructed Prim Dressing: Maxorb Extra Ag+ Alginate Dressing, 2x2 (in/in) 1 x Per Week/30 Days ary Discharge Instructions: Apply to wound bed as instructed Secondary Dressing: ALLEVYN Heel 4 1/2in x 5 1/2in / 10.5cm x 13.5cm 1 x Per Week/30 Days Discharge Instructions: Apply over primary dressing as directed. Secondary Dressing: Woven Gauze Sponge, Non-Sterile 4x4 in 1 x Per Week/30 Days Discharge Instructions: Apply over primary dressing as directed. Secured With: Coban Self-Adherent Wrap 4x5 (in/yd) 1 x  Per Week/30 Days Discharge Instructions: Secure with Coban as directed. Secured With: [employer], 4.5x3.1 (in/yd) 1 x Per Week/30 Days Discharge Instructions: Secure with Kerlix as directed. Patient Medications llergies: No Known Drug Allergies A Notifications Medication Indication Start End wound infection 03/20/2023 sulfamethoxazole-trimethoprim DOSE oral 800 mg-160 mg tablet - tablet oral twice a day Electronic Signature(s) Signed: 03/20/2023 12:12:22 PM By: [name] MD [name] ([id number]) [id number] Page 4 of 7 Entered By: [name] on 03/20/2023 12:12:21 -------------------------------------------------------------------------------- Problem List Details Patient Name: Date of Service: TO [name] 03/20/2023 10:30 A M Medical Record Number: [id number] Patient Account Number: [id number] Date of Birth/Sex: Treating RN: 07-Oct-1940 (82 y.o. 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 02/18/2023 No Yes severity I87.331 Chronic venous hypertension (idiopathic) with ulcer and inflammation of right 02/18/2023 No Yes lower extremity Inactive Problems ICD-10 Code Description Active Date Inactive Date L97.811 Non-pressure chronic ulcer of other part of right lower leg limited to breakdown of skin 02/18/2023 02/18/2023 Resolved Problems Electronic Signature(s) Signed: 03/21/2023 12:59:16 PM By: [name] MD Entered By: [name] on 03/20/2023 12:08:14 -------------------------------------------------------------------------------- Progress Note Details Patient Name:  Date of Service: TO [name] 03/20/2023 10:30 A M Medical Record Number: [id number] Patient Account Number: [id number] Date of Birth/Sex: Treating RN: 1941/02/01 (82 y.o. M) Primary Care Provider: Guerry Bruin Other Clinician: Referring Provider: Treating Provider/Extender: Wyline Beady in Treatment: 4 Subjective History of Present Illness (HPI) ADMISSION CASS, RILL ([id number]) [id number] Page 5 of 7 02/18/2023 This is an 82 year old still very active man who was referred to [place] by Dr. Ardelle Anton at [facility]. He has a nonhealing wound  Cleanser: Vashe 5.8 (oz) 1 x Per Week/30 Days Discharge Instructions: Cleanse the wound with Vashe prior to applying a clean dressing using gauze sponges, not tissue or cotton balls. Peri-Wound Care: Triamcinolone 15 (g) 1 x Per Week/30 Days Discharge Instructions: Use triamcinolone 15 (g) as directed Peri-Wound Care: Sween Lotion (Moisturizing lotion) 1 x Per Week/30 Days Discharge Instructions: Apply moisturizing lotion as directed Topical: Mupirocin Ointment 1 x Per Week/30 Days Discharge Instructions: Apply Mupirocin (Bactroban) as instructed Prim Dressing: Maxorb Extra Ag+ Alginate Dressing, 2x2 (in/in) 1 x Per Week/30 Days ary Discharge Instructions: Apply to wound bed as instructed Secondary Dressing: ALLEVYN Heel 4 1/2in x 5 1/2in / 10.5cm x 13.5cm 1 x Per Week/30 Days Discharge Instructions: Apply over primary dressing as directed. Secondary Dressing: Woven Gauze Sponge, Non-Sterile 4x4 in 1 x Per Week/30 Days Discharge Instructions: Apply over primary dressing as directed. Secured With: Coban Self-Adherent Wrap 4x5 (in/yd) 1 x Per Week/30 Days Discharge Instructions: Secure with Coban as directed. Secured With: [employer], 4.5x3.1 (in/yd) 1 x Per Week/30 Days Discharge Instructions: Secure with Kerlix as directed. 1. Continue with Maxorb extra #2 I gave him a weeks worth of Septra DS 3. Prepare for total contact cast next week likely with silver collagen as the primary dressing Electronic Signature(s) Signed: 03/21/2023 12:59:16 PM By: [name] MD Entered By: [name] on 03/20/2023  12:30:16 -------------------------------------------------------------------------------- SuperBill Details Patient Name: Date of Service: TO [name] 03/20/2023 Medical Record Number: [id number] Patient Account Number: [id number] Date of Birth/Sex: Treating RN: Sep 02, 1940 (82 y.o. 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 I87.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 Facility Procedures : CPT4 Code: 23557322 Description: 11042 - DEB SUBQ TISSUE 20 SQ CM/< ICD-10 Diagnosis Description 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 seve E11.621 Type 2 diabetes mellitus with foot ulcer Modifier: rity Quantity: 1 Physician Procedures : CPT4 Code Description Modifier [id number] - WC PHYS LEVEL 3 - EST PT 25 ICD-10 Diagnosis Description 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 Peri-Wound Care: Triamcinolone 15 (g) 1 x Per Week/30 Days Discharge Instructions: Use triamcinolone 15 (g) as directed Peri-Wound Care: Sween Lotion (Moisturizing lotion) 1 x Per Week/30 Days Discharge Instructions: Apply moisturizing lotion as directed Topical: Mupirocin Ointment 1 x Per Week/30 Days Discharge Instructions: Apply Mupirocin (Bactroban) as instructed Prim Dressing: Maxorb Extra Ag+ Alginate Dressing, 2x2 (in/in) 1 x Per Week/30 Days ary Discharge Instructions: Apply to wound bed as instructed Secondary Dressing: ALLEVYN Heel 4 1/2in x 5 1/2in / 10.5cm x 13.5cm 1 x Per Week/30 Days Discharge Instructions: Apply over primary dressing as directed. Secondary Dressing: Woven Gauze Sponge, Non-Sterile 4x4 in 1 x Per Week/30 Days Discharge Instructions: Apply over primary dressing as directed. Secured With: Coban Self-Adherent Wrap 4x5 (in/yd) 1 x Per Week/30 Days Discharge Instructions: Secure with Coban as directed. Secured With: [employer], 4.5x3.1 (in/yd) 1 x Per Week/30 Days Discharge Instructions: Secure with Kerlix as directed. 1. Continue with Maxorb extra #2 I gave him a weeks worth of Septra DS 3. Prepare for total contact cast next week likely with silver collagen as the primary dressing Electronic Signature(s) Signed: 03/21/2023 12:59:16 PM By: [name] MD Entered By: [name] on 03/20/2023  12:30:16 -------------------------------------------------------------------------------- SuperBill Details Patient Name: Date of Service: TO [name] 03/20/2023 Medical Record Number: [id number] Patient Account Number: [id number] Date of Birth/Sex: Treating RN: Sep 02, 1940 (82 y.o. 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 I87.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 Facility Procedures : CPT4 Code: 23557322 Description: 11042 - DEB SUBQ TISSUE 20 SQ CM/< ICD-10 Diagnosis Description 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 seve E11.621 Type 2 diabetes mellitus with foot ulcer Modifier: rity Quantity: 1 Physician Procedures : CPT4 Code Description Modifier [id number] 99213 - WC PHYS LEVEL 3 - EST PT 25 ICD-10 Diagnosis Description E11.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 03/20/2023 10:30 A M Medical Record Number: [id number] Patient Account Number: [id number] Date of Birth/Sex: Treating RN: 1941-02-11 (82 y.o. M) Primary Care Provider: Guerry Bruin Other Clinician: Referring Provider: Treating Provider/Extender: Wyline Beady in Treatment: 4 Debridement Performed for Assessment: Wound #1 Right Calcaneus Performed By: Physician Maxwell Caul., MD The following information was scribed by: Karie Schwalbe The information was scribed for: [name] Debridement Type: Debridement Severity of Tissue Pre Debridement: Fat layer exposed Level of Consciousness (Pre-procedure): Awake and Alert Pre-procedure Verification/Time Out Yes - 11:40 Taken: Start Time: 11:40 Pain Control: Lidocaine 5% topical ointment Percent of Wound Bed Debrided: 100% T Area Debrided (cm): otal 0.16 Tissue and other material debrided: Viable, Non-Viable, Callus, Subcutaneous, Skin: Dermis , Skin: Epidermis Level: Skin/Subcutaneous Tissue Debridement Description: Excisional Instrument: Curette Bleeding: Minimum Hemostasis Achieved: Silver Nitrate Response to Treatment: Procedure was tolerated well Level of Consciousness (Post- Awake and Alert procedure): Post Debridement Measurements of Total Wound Length: (cm) 0.5 Width: (cm) 0.4 Depth: (cm) 0.5 Volume: (cm) 0.079 Character of Wound/Ulcer Post Debridement: Improved Severity of Tissue Post Debridement: Fat layer exposed Post Procedure Diagnosis Same as Pre-procedure Electronic Signature(s) Signed: 03/21/2023 12:59:16 PM By: [name] MD Entered By: [name] on 03/20/2023 12:10:45 -------------------------------------------------------------------------------- HPI Details Patient Name: Date of  Service: TO [name], [name] 03/20/2023 10:30 A M Medical Record Number: [id number] Patient Account Number: [id number] Date of Birth/Sex: Treating RN: 19-May-1941 (82 y.o. M) Primary Care Provider: Guerry Bruin Other Clinician: Referring Provider: Treating Provider/Extender: Wyline Beady in Treatment: 4 History of Present Illness LOUDEN, POULIOT ([id number]) [id number] Page 2 of 7 HPI Description: ADMISSION 02/18/2023 This is an 82 year old still very active man who was referred to [place] by Dr. Ardelle Anton at [facility]. He has a nonhealing wound on the plantar aspect of his right heel. He has been dealing with this since June when he presented to Dr. Loreta Ave with a painful area on the right mid heel. He was not really sure how this happened. He ultimately had an MRI in July that showed no osteomyelitis but there was a small abscess he was treated with antibiotics doxycycline and gradually the pain improved but he still been left with a small open area. He had an ABI on 11/14/2022 that was 1.08 on the right and 0.99 on the left. He by enlarge has been using Silvadene on the wound. Recently changed to silver alginate. He also developed a blister on the right anterior lower leg. Recent x-rays have not shown any cortical changes. Also in June he had a sedimentation rate of 2 and a C-reactive protein of 14 not suggestive of chronic active infection Past medical history includes type 2 diabetes, nonischemic cardiomyopathy, hypertension cholesterolemia, skin cancer, combined congestive heart failure, paroxysmal A-fib, idiopathic trigeminal neuralgia. 10/1; ulcer on the plantar aspect of his right heel. He is being compliant with the healing boot. We have been using Hydrofera Blue heel cup gauze. Dimensions are better 10/7; the area on the leg right anterior is healed. He has chronic insufficiency. Unfortunately the ulcer on the plantar aspect of  his right heel looks about the same each time with undermining the skin is not adherent to the underlying tissue. This requires debridement. We have been using Hydrofera Blue, heel cup and heel offloading shoe. We are not  Cleanser: Vashe 5.8 (oz) 1 x Per Week/30 Days Discharge Instructions: Cleanse the wound with Vashe prior to applying a clean dressing using gauze sponges, not tissue or cotton balls. Peri-Wound Care: Triamcinolone 15 (g) 1 x Per Week/30 Days Discharge Instructions: Use triamcinolone 15 (g) as directed Peri-Wound Care: Sween Lotion (Moisturizing lotion) 1 x Per Week/30 Days Discharge Instructions: Apply moisturizing lotion as directed Topical: Mupirocin Ointment 1 x Per Week/30 Days Discharge Instructions: Apply Mupirocin (Bactroban) as instructed Prim Dressing: Maxorb Extra Ag+ Alginate Dressing, 2x2 (in/in) 1 x Per Week/30 Days ary Discharge Instructions: Apply to wound bed as instructed Secondary Dressing: ALLEVYN Heel 4 1/2in x 5 1/2in / 10.5cm x 13.5cm 1 x Per Week/30 Days Discharge Instructions: Apply over primary dressing as directed. Secondary Dressing: Woven Gauze Sponge, Non-Sterile 4x4 in 1 x Per Week/30 Days Discharge Instructions: Apply over primary dressing as directed. Secured With: Coban Self-Adherent Wrap 4x5 (in/yd) 1 x Per Week/30 Days Discharge Instructions: Secure with Coban as directed. Secured With: [employer], 4.5x3.1 (in/yd) 1 x Per Week/30 Days Discharge Instructions: Secure with Kerlix as directed. 1. Continue with Maxorb extra #2 I gave him a weeks worth of Septra DS 3. Prepare for total contact cast next week likely with silver collagen as the primary dressing Electronic Signature(s) Signed: 03/21/2023 12:59:16 PM By: [name] MD Entered By: [name] on 03/20/2023  12:30:16 -------------------------------------------------------------------------------- SuperBill Details Patient Name: Date of Service: TO [name] 03/20/2023 Medical Record Number: [id number] Patient Account Number: [id number] Date of Birth/Sex: Treating RN: Sep 02, 1940 (82 y.o. 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 I87.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 Facility Procedures : CPT4 Code: 23557322 Description: 11042 - DEB SUBQ TISSUE 20 SQ CM/< ICD-10 Diagnosis Description 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 seve E11.621 Type 2 diabetes mellitus with foot ulcer Modifier: rity Quantity: 1 Physician Procedures : CPT4 Code Description Modifier [id number] 99213 - WC PHYS LEVEL 3 - EST PT 25 ICD-10 Diagnosis Description 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 Avoid standing for long periods of time. Off-Loading Total Contact Cast to Right Lower Extremity - Prepare for first TCC on 03/25/23 wear loose leg pants then return 02/3123 for TCC change, after initial placement Wedge shoe to: - heel offloading shoe- wear while walking and standing. Mininize walking and standing. Wound Treatment Wound #1 - Calcaneus Wound Laterality: Right Cleanser: Soap and Water 1 x Per Week/30 Days Discharge Instructions: May shower and wash wound with dial antibacterial soap and water prior to dressing change. Cleanser: Vashe 5.8 (oz) 1 x Per Week/30 Days Discharge Instructions: Cleanse the wound with Vashe prior to applying a clean dressing using gauze sponges, not tissue or cotton balls. Peri-Wound Care: Triamcinolone 15 (g) 1 x Per Week/30 Days Discharge Instructions: Use triamcinolone 15 (g) as directed Peri-Wound Care: Sween Lotion (Moisturizing lotion) 1 x Per Week/30 Days Discharge Instructions: Apply moisturizing lotion as directed Topical: Mupirocin Ointment 1 x Per Week/30 Days Discharge Instructions: Apply Mupirocin (Bactroban) as instructed Prim Dressing: Maxorb Extra Ag+ Alginate Dressing, 2x2 (in/in) 1 x Per Week/30 Days ary Discharge Instructions: Apply to wound bed as instructed Secondary Dressing: ALLEVYN Heel 4 1/2in x 5 1/2in / 10.5cm x 13.5cm 1 x Per Week/30 Days Discharge Instructions: Apply over primary dressing as directed. Secondary Dressing: Woven Gauze Sponge, Non-Sterile 4x4 in 1 x Per Week/30 Days Discharge Instructions: Apply over primary dressing as directed. Secured With: Coban Self-Adherent Wrap 4x5 (in/yd) 1 x  Per Week/30 Days Discharge Instructions: Secure with Coban as directed. Secured With: [employer], 4.5x3.1 (in/yd) 1 x Per Week/30 Days Discharge Instructions: Secure with Kerlix as directed. Patient Medications llergies: No Known Drug Allergies A Notifications Medication Indication Start End wound infection 03/20/2023 sulfamethoxazole-trimethoprim DOSE oral 800 mg-160 mg tablet - tablet oral twice a day Electronic Signature(s) Signed: 03/20/2023 12:12:22 PM By: [name] MD [name] ([id number]) [id number] Page 4 of 7 Entered By: [name] on 03/20/2023 12:12:21 -------------------------------------------------------------------------------- Problem List Details Patient Name: Date of Service: TO [name] 03/20/2023 10:30 A M Medical Record Number: [id number] Patient Account Number: [id number] Date of Birth/Sex: Treating RN: 07-Oct-1940 (82 y.o. 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 02/18/2023 No Yes severity I87.331 Chronic venous hypertension (idiopathic) with ulcer and inflammation of right 02/18/2023 No Yes lower extremity Inactive Problems ICD-10 Code Description Active Date Inactive Date L97.811 Non-pressure chronic ulcer of other part of right lower leg limited to breakdown of skin 02/18/2023 02/18/2023 Resolved Problems Electronic Signature(s) Signed: 03/21/2023 12:59:16 PM By: [name] MD Entered By: [name] on 03/20/2023 12:08:14 -------------------------------------------------------------------------------- Progress Note Details Patient Name:  Date of Service: TO [name] 03/20/2023 10:30 A M Medical Record Number: [id number] Patient Account Number: [id number] Date of Birth/Sex: Treating RN: 1941/02/01 (82 y.o. M) Primary Care Provider: Guerry Bruin Other Clinician: Referring Provider: Treating Provider/Extender: Wyline Beady in Treatment: 4 Subjective History of Present Illness (HPI) ADMISSION CASS, RILL ([id number]) [id number] Page 5 of 7 02/18/2023 This is an 82 year old still very active man who was referred to [place] by Dr. Ardelle Anton at [facility]. He has a nonhealing wound

## 2023-03-24 DIAGNOSIS — Z23 Encounter for immunization: Secondary | ICD-10-CM | POA: Diagnosis not present

## 2023-03-24 DIAGNOSIS — N1832 Chronic kidney disease, stage 3b: Secondary | ICD-10-CM | POA: Diagnosis not present

## 2023-03-24 DIAGNOSIS — I509 Heart failure, unspecified: Secondary | ICD-10-CM | POA: Diagnosis not present

## 2023-03-24 DIAGNOSIS — I480 Paroxysmal atrial fibrillation: Secondary | ICD-10-CM | POA: Diagnosis not present

## 2023-03-24 DIAGNOSIS — Z794 Long term (current) use of insulin: Secondary | ICD-10-CM | POA: Diagnosis not present

## 2023-03-24 DIAGNOSIS — F411 Generalized anxiety disorder: Secondary | ICD-10-CM | POA: Diagnosis not present

## 2023-03-24 DIAGNOSIS — D692 Other nonthrombocytopenic purpura: Secondary | ICD-10-CM | POA: Diagnosis not present

## 2023-03-24 DIAGNOSIS — E1129 Type 2 diabetes mellitus with other diabetic kidney complication: Secondary | ICD-10-CM | POA: Diagnosis not present

## 2023-03-24 DIAGNOSIS — E7800 Pure hypercholesterolemia, unspecified: Secondary | ICD-10-CM | POA: Diagnosis not present

## 2023-03-24 DIAGNOSIS — D6869 Other thrombophilia: Secondary | ICD-10-CM | POA: Diagnosis not present

## 2023-03-24 DIAGNOSIS — Z7901 Long term (current) use of anticoagulants: Secondary | ICD-10-CM | POA: Diagnosis not present

## 2023-03-24 DIAGNOSIS — I130 Hypertensive heart and chronic kidney disease with heart failure and stage 1 through stage 4 chronic kidney disease, or unspecified chronic kidney disease: Secondary | ICD-10-CM | POA: Diagnosis not present

## 2023-03-25 ENCOUNTER — Encounter (HOSPITAL_BASED_OUTPATIENT_CLINIC_OR_DEPARTMENT_OTHER): Payer: PPO | Admitting: Internal Medicine

## 2023-03-25 DIAGNOSIS — L97412 Non-pressure chronic ulcer of right heel and midfoot with fat layer exposed: Secondary | ICD-10-CM | POA: Diagnosis not present

## 2023-03-25 DIAGNOSIS — E11621 Type 2 diabetes mellitus with foot ulcer: Secondary | ICD-10-CM | POA: Diagnosis not present

## 2023-03-25 NOTE — Progress Notes (Addendum)
Erik [name], Erik [name] ([id number]) [id number] Page 1 of 9 Visit Report for 03/25/2023 Arrival Information Details Patient Name: Date of Service: TO Erik [name]. 03/25/2023 2:45 PM Medical Record Number: [id number] Patient Account Number: [id number] Date of Birth/Sex: Treating RN: 11-02-1940 (82 y.o. M) Primary Care Carinna Newhart: Guerry Bruin Other Clinician: Referring Harvey Matlack: Treating Ura Hausen/Extender: Wyline Beady in Treatment: 5 Visit Information History Since Last Visit Added or deleted any medications: No Patient Arrived: Ambulatory Any new allergies or adverse reactions: No Arrival Time: 14:59 Had a fall or experienced change in No Accompanied By: wife activities of daily living that may affect Transfer Assistance: None risk of falls: Patient Identification Verified: Yes Signs or symptoms of abuse/neglect since last visito No Secondary Verification Process Completed: Yes Hospitalized since last visit: No Patient Requires Transmission-Based Precautions: No Implantable device outside of the clinic excluding No Patient Has Alerts: Yes cellular tissue based products placed in the center Patient Alerts: ABI: R 1.08 11/14/22 since last visit: Pain Present Now: No Electronic Signature(s) Signed: 03/25/2023 3:08:21 PM By: Dayton Scrape Entered By: Dayton Scrape on 03/25/2023 11:59:44 -------------------------------------------------------------------------------- Clinic Level of Care Assessment Details Patient Name: Date of Service: TO Erik [name] 03/25/2023 2:45 PM Medical Record Number: [id number] Patient Account Number: [id number] Date of Birth/Sex: Treating RN: 04/19/1941 (82 y.o. Charlean Merl, Lauren Primary Care Harrold Fitchett: Guerry Bruin Other Clinician: Referring Pualani Borah: Treating Eh Sesay/Extender: Wyline Beady in Treatment: 5 Clinic Level of Care Assessment Items TOOL 4 Quantity  Score X- 1 0 Use when only an EandM is performed on FOLLOW-UP visit ASSESSMENTS - Nursing Assessment / Reassessment X- 1 10 Reassessment of Co-morbidities (includes updates in patient status) X- 1 5 Reassessment of Adherence to Treatment Plan ASSESSMENTS - Wound and Skin A ssessment / Reassessment X - Simple Wound Assessment / Reassessment - one wound 1 5 []  - 0 Complex Wound Assessment / Reassessment - multiple wounds []  - 0 Dermatologic / Skin Assessment (not related to wound area) ASSESSMENTS - Focused Assessment X- 1 5 Circumferential Edema Measurements - multi extremities []  - 0 Nutritional Assessment / Counseling / Intervention []  - 0 Lower Extremity Assessment (monofilament, tuning fork, pulses) Erik [name] ([id number]) [id number] Page 2 of 9 []  - 0 Peripheral Arterial Disease Assessment (using hand held doppler) ASSESSMENTS - Ostomy and/or Continence Assessment and Care []  - 0 Incontinence Assessment and Management []  - 0 Ostomy Care Assessment and Management (repouching, etc.) PROCESS - Coordination of Care X - Simple Patient / Family Education for ongoing care 1 15 []  - 0 Complex (extensive) Patient / Family Education for ongoing care X- 1 10 Staff obtains Chiropractor, Records, T Results / Process Orders est []  - 0 Staff telephones HHA, Nursing Homes / Clarify orders / etc []  - 0 Routine Transfer to another Facility (non-emergent condition) []  - 0 Routine Hospital Admission (non-emergent condition) []  - 0 New Admissions / Manufacturing engineer / Ordering NPWT Apligraf, etc. , []  - 0 Emergency Hospital Admission (emergent condition) X- 1 10 Simple Discharge Coordination []  - 0 Complex (extensive) Discharge Coordination PROCESS - Special Needs []  - 0 Pediatric / Minor Patient Management []  - 0 Isolation Patient Management []  - 0 Hearing / Language / Visual special needs []  - 0 Assessment of Community assistance  (transportation, D/C planning, etc.) []  - 0 Additional assistance / Altered mentation []  - 0 Support Surface(s) Assessment (bed, cushion, seat, etc.) INTERVENTIONS - Wound Cleansing / Measurement X - Simple Wound Cleansing -  one wound 1 5 []  - 0 Complex Wound Cleansing - multiple wounds X- 1 5 Wound Imaging (photographs - any number of wounds) []  - 0 Wound Tracing (instead of photographs) X- 1 5 Simple Wound Measurement - one wound []  - 0 Complex Wound Measurement - multiple wounds INTERVENTIONS - Wound Dressings X - Small Wound Dressing one or multiple wounds 1 10 []  - 0 Medium Wound Dressing one or multiple wounds []  - 0 Large Wound Dressing one or multiple wounds X- 1 5 Application of Medications - topical []  - 0 Application of Medications - injection INTERVENTIONS - Miscellaneous []  - 0 External ear exam []  - 0 Specimen Collection (cultures, biopsies, blood, body fluids, etc.) []  - 0 Specimen(s) / Culture(s) sent or taken to Lab for analysis []  - 0 Patient Transfer (multiple staff / Nurse, adult / Similar devices) []  - 0 Simple Staple / Suture removal (25 or less) []  - 0 Complex Staple / Suture removal (26 or more) []  - 0 Hypo / Hyperglycemic Management (close monitor of Blood Glucose) []  - 0 Ankle / Brachial Index (ABI) - do not check if billed separately Erik [name], Erik [name] ([id number]) [id number].pdf Page 3 of 9 X- 1 5 Vital Signs Has the patient been seen at the hospital within the last three years: Yes Total Score: 95 Level Of Care: New/Established - Level 3 Electronic Signature(s) Signed: 03/25/2023 4:23:12 PM By: Fonnie Mu RN Entered By: Fonnie Mu on 03/25/2023 13:03:20 -------------------------------------------------------------------------------- Encounter Discharge Information Details Patient Name: Date of Service: [name] 03/25/2023 2:45 PM Medical Record Number: [id number] Patient Account Number:  [id number] Date of Birth/Sex: Treating RN: 1941-04-16 (82 y.o. Erik [name] Primary Care Ledger Heindl: Guerry Bruin Other Clinician: Referring Marquavis Hannen: Treating Solash Tullo/Extender: Wyline Beady in Treatment: 5 Encounter Discharge Information Items Discharge Condition: Stable Ambulatory Status: Walker Discharge Destination: Home Transportation: Private Auto Accompanied By: wife Schedule Follow-up Appointment: Yes Clinical Summary of Care: Patient Declined Electronic Signature(s) Signed: 03/25/2023 4:23:12 PM By: Fonnie Mu RN Entered By: Fonnie Mu on 03/25/2023 13:03:54 -------------------------------------------------------------------------------- Lower Extremity Assessment Details Patient Name: Date of Service: [name] 03/25/2023 2:45 PM Medical Record Number: [id number] Patient Account Number: [id number] Date of Birth/Sex: Treating RN: 1940/10/26 (82 y.o. [name] Primary Care Surabhi Gadea: Guerry Bruin Other Clinician: Referring Rukiya Hodgkins: Treating Ramesha Poster/Extender: Wyline Beady in Treatment: 5 Edema Assessment Assessed: [name]: No] [name]: Yes] Edema: [Left: Ye] [Right: s] Calf Left: Right: Point of Measurement: From Medial Instep 36 cm Ankle Left: Right: Point of Measurement: From Medial Instep 25 cm Vascular Assessment Pulses: AZION, CENTRELLA ([id number]) [Right:[id number] Page 4 of 9] Dorsalis Pedis Palpable: [Right:Yes] Extremity colors, hair growth, and conditions: Extremity Color: [Right:Normal] Hair Growth on Extremity: [Right:No] Temperature of Extremity: [Right:Warm] Capillary Refill: [Right:< 3 seconds] Dependent Rubor: [Right:No] Blanched when Elevated: [Right:No Yes] Toe Nail Assessment Left: Right: Thick: Yes Discolored: Yes Deformed: Yes Improper Length and Hygiene: No Electronic Signature(s) Signed: 03/27/2023 6:03:32 PM By:  Shawn Stall RN, BSN Entered By: Shawn Stall on 03/25/2023 12:14:31 -------------------------------------------------------------------------------- Multi Wound Chart Details Patient Name: Date of Service: [name] 03/25/2023 2:45 PM Medical Record Number: [id number] Patient Account Number: [id number] Date of Birth/Sex: Treating RN: April 27, 1941 (82 y.o. M) Primary Care Delois Silvester: Guerry Bruin Other Clinician: Referring Masami Plata: Treating Keisean Skowron/Extender: Wyline Beady in Treatment: 5 Vital Signs Height(in): 71 Capillary Blood Glucose(mg/dl): 951 Weight(lbs): 884 Pulse(bpm): 66 Body Mass Index(BMI): 26.9 Blood  Pressure(mmHg): 148/71 Temperature(F): 98.4 Respiratory Rate(breaths/min): 18 [1:Photos:] [N/A:N/A] Right Calcaneus N/A N/A Wound Location: Gradually Appeared N/A N/A Wounding Event: Diabetic Wound/Ulcer of the Lower N/A N/A Primary Etiology: Extremity Anemia, Arrhythmia, Coronary Artery N/A N/A Comorbid History: Disease, Hypertension, Peripheral Arterial Disease, Type II Diabetes, Neuropathy 01/21/2023 N/A N/A Date Acquired: 5 N/A N/A Weeks of Treatment: Open N/A N/A Wound Status: No N/A N/A Wound Recurrence: 0.4x0.4x0.3 N/A N/A Measurements L x W x D (cm) 0.126 N/A N/A A (cm) : rea 0.038 N/A N/A Volume (cm) : 10.60% N/A N/A % Reduction in A rea: -35.70% N/A N/A % Reduction in Volume: 12 Starting Position 1 (o'clock): 12 Ending Position 1 (o'clock): Erik [name], Erik [name] ([id number]) [phone].pdf Page 5 of 9 0.9 Maximum Distance 1 (cm): Yes N/A N/A Undermining: Grade 2 N/A N/A Classification: Medium N/A N/A Exudate A mount: Serosanguineous N/A N/A Exudate Type: red, brown N/A N/A Exudate Color: Distinct, outline attached N/A N/A Wound Margin: Large (67-100%) N/A N/A Granulation A mount: Red N/A N/A Granulation Quality: Small (1-33%) N/A N/A Necrotic A mount: Fat Layer  (Subcutaneous Tissue): Yes N/A N/A Exposed Structures: Fascia: No Tendon: No Muscle: No Joint: No Bone: No Small (1-33%) N/A N/A Epithelialization: Callus: Yes N/A N/A Periwound Skin Texture: Excoriation: No Induration: No Crepitus: No Rash: No Scarring: No Maceration: No N/A N/A Periwound Skin Moisture: Dry/Scaly: No Atrophie Blanche: No N/A N/A Periwound Skin Color: Cyanosis: No Ecchymosis: No Erythema: No Hemosiderin Staining: No Mottled: No Pallor: No Rubor: No No Abnormality N/A N/A Temperature: Yes N/A N/A Tenderness on Palpation: Treatment Notes Wound #1 (Calcaneus) Wound Laterality: Right Cleanser Soap and Water Discharge Instruction: May shower and wash wound with dial antibacterial soap and water prior to dressing change. Vashe 5.8 (oz) Discharge Instruction: Cleanse the wound with Vashe prior to applying a clean dressing using gauze sponges, not tissue or cotton balls. Peri-Wound Care Triamcinolone 15 (g) Discharge Instruction: Use triamcinolone 15 (g) as directed Sween Lotion (Moisturizing lotion) Discharge Instruction: Apply moisturizing lotion as directed Topical Mupirocin Ointment Discharge Instruction: Apply Mupirocin (Bactroban) as instructed Primary Dressing Maxorb Extra Ag+ Alginate Dressing, 2x2 (in/in) Discharge Instruction: Apply to wound bed as instructed Secondary Dressing ALLEVYN Heel 4 1/2in x 5 1/2in / 10.5cm x 13.5cm Discharge Instruction: Apply over primary dressing as directed. Woven Gauze Sponge, Non-Sterile 4x4 in Discharge Instruction: Apply over primary dressing as directed. Secured With [employer] 4x5 (in/yd) Discharge Instruction: Secure with Coban as directed. Kerlix Roll Sterile, 4.5x3.1 (in/yd) Discharge Instruction: Secure with Kerlix as directed. Compression Wrap Compression Stockings Add-Ons Erik [name], Erik [name] ([id number]) [phone].pdf Page 6 of 9 Electronic  Signature(s) Signed: 03/25/2023 5:13:18 PM By: [name] MD Entered By: [name] on 03/25/2023 13:05:06 -------------------------------------------------------------------------------- Multi-Disciplinary Care Plan Details Patient Name: Date of Service: [name] 03/25/2023 2:45 PM Medical Record Number: [id number] Patient Account Number: [id number] Date of Birth/Sex: Treating RN: 1940/11/24 (82 y.o. Erik [name] Primary Care Temprence Rhines: Guerry Bruin Other Clinician: Referring Jakeim Sedore: Treating Juda Lajeunesse/Extender: Wyline Beady in Treatment: 5 Active Inactive Wound/Skin Impairment Nursing Diagnoses: Impaired tissue integrity Knowledge deficit related to ulceration/compromised skin integrity Goals: Patient/caregiver will verbalize understanding of skin care regimen Date Initiated: 02/18/2023 Target Resolution Date: 05/04/2023 Goal Status: Active Ulcer/skin breakdown will have a volume reduction of 30% by week 4 Date Initiated: 02/18/2023 Target Resolution Date: 05/26/2023 Goal Status: Active Interventions: Assess patient/caregiver ability to obtain necessary supplies Assess patient/caregiver ability to perform ulcer/skin care regimen upon admission and as needed  Assess ulceration(s) every visit Provide education on ulcer and skin care Notes: Electronic Signature(s) Signed: 03/25/2023 4:23:12 PM By: Fonnie Mu RN Entered By: Fonnie Mu on 03/25/2023 12:57:41 -------------------------------------------------------------------------------- Pain Assessment Details Patient Name: Date of Service: [name] 03/25/2023 2:45 PM Medical Record Number: [id number] Patient Account Number: [id number] Date of Birth/Sex: Treating RN: 08-15-1940 (82 y.o. M) Primary Care Romi Rathel: Guerry Bruin Other Clinician: Referring Taronda Comacho: Treating Margaree Sandhu/Extender: Wyline Beady in Treatment:  5 Active Problems Location of Pain Severity and Description of Pain Patient Has Paino No Site Locations [facility], [facility] ([id number]) [id number] Page 7 of 9 Pain Management and Medication Current Pain Management: Electronic Signature(s) Signed: 03/25/2023 3:08:21 PM By: Dayton Scrape Entered By: Dayton Scrape on 03/25/2023 12:00:43 -------------------------------------------------------------------------------- Patient/Caregiver Education Details Patient Name: Date of Service: TO Erik [name] 10/29/2024andnbsp2:45 PM Medical Record Number: [id number] Patient Account Number: [id number] Date of Birth/Gender: Treating RN: 07-02-1940 (82 y.o. Erik [name] Primary Care Physician: Guerry Bruin Other Clinician: Referring Physician: Treating Physician/Extender: Wyline Beady in Treatment: 5 Education Assessment Education Provided To: Patient Education Topics Provided Wound/Skin Impairment: Methods: Explain/Verbal Responses: Reinforcements needed, State content correctly Electronic Signature(s) Signed: 03/25/2023 4:23:12 PM By: Fonnie Mu RN Entered By: Fonnie Mu on 03/25/2023 12:57:51 -------------------------------------------------------------------------------- Wound Assessment Details Patient Name: Date of Service: [name] 03/25/2023 2:45 PM Medical Record Number: [id number] Patient Account Number: [id number] Date of Birth/Sex: Treating RN: May 22, 1941 (82 y.o. M) Primary Care Fraser Busche: Guerry Bruin Other Clinician: Referring Amelda Hapke: Treating Arcangel Minion/Extender: Aakash, Hollomon ([id number]) [id number] Page 8 of 9 Weeks in Treatment: 5 Wound Status Wound Number: 1 Primary Diabetic Wound/Ulcer of the Lower Extremity Etiology: Wound Location: Right Calcaneus Wound Open Wounding Event: Gradually Appeared Status: Date  Acquired: 01/21/2023 Comorbid Anemia, Arrhythmia, Coronary Artery Disease, Hypertension, Weeks Of Treatment: 5 History: Peripheral Arterial Disease, Type II Diabetes, Neuropathy Clustered Wound: No Photos Wound Measurements Length: (cm) 0.4 Width: (cm) 0.4 Depth: (cm) 0.3 Area: (cm) 0.126 Volume: (cm) 0.038 % Reduction in Area: 10.6% % Reduction in Volume: -35.7% Epithelialization: Small (1-33%) Tunneling: No Undermining: Yes Starting Position (o'clock): 12 Ending Position (o'clock): 12 Maximum Distance: (cm) 0.9 Wound Description Classification: Grade 2 Wound Margin: Distinct, outline attached Exudate Amount: Medium Exudate Type: Serosanguineous Exudate Color: red, brown Foul Odor After Cleansing: No Slough/Fibrino Yes Wound Bed Granulation Amount: Large (67-100%) Exposed Structure Granulation Quality: Red Fascia Exposed: No Necrotic Amount: Small (1-33%) Fat Layer (Subcutaneous Tissue) Exposed: Yes Necrotic Quality: Adherent Slough Tendon Exposed: No Muscle Exposed: No Joint Exposed: No Bone Exposed: No Periwound Skin Texture Texture Color No Abnormalities Noted: No No Abnormalities Noted: No Callus: Yes Atrophie Blanche: No Crepitus: No Cyanosis: No Excoriation: No Ecchymosis: No Induration: No Erythema: No Rash: No Hemosiderin Staining: No Scarring: No Mottled: No Pallor: No Moisture Rubor: No No Abnormalities Noted: No Dry / Scaly: No Temperature / Pain Maceration: No Temperature: No Abnormality Tenderness on Palpation: Yes Treatment Notes Wound #1 (Calcaneus) Wound Laterality: Right Cleanser Soap and Water Discharge Instruction: May shower and wash wound with dial antibacterial soap and water prior to dressing change. Erik [name], Erik [name] ([id number]) [id number] Page 9 of 9 Vashe 5.8 (oz) Discharge Instruction: Cleanse the wound with Vashe prior to applying a clean dressing using gauze sponges, not tissue or cotton  balls. Peri-Wound Care Triamcinolone 15 (g) Discharge Instruction: Use triamcinolone 15 (g) as directed Sween Lotion (Moisturizing lotion) Discharge Instruction: Apply moisturizing lotion as  directed Topical Mupirocin Ointment Discharge Instruction: Apply Mupirocin (Bactroban) as instructed Primary Dressing Maxorb Extra Ag+ Alginate Dressing, 2x2 (in/in) Discharge Instruction: Apply to wound bed as instructed Secondary Dressing ALLEVYN Heel 4 1/2in x 5 1/2in / 10.5cm x 13.5cm Discharge Instruction: Apply over primary dressing as directed. Woven Gauze Sponge, Non-Sterile 4x4 in Discharge Instruction: Apply over primary dressing as directed. Secured With [employer] 4x5 (in/yd) Discharge Instruction: Secure with Coban as directed. Kerlix Roll Sterile, 4.5x3.1 (in/yd) Discharge Instruction: Secure with Kerlix as directed. Compression Wrap Compression Stockings Add-Ons Electronic Signature(s) Signed: 03/27/2023 6:03:32 PM By: Shawn Stall RN, BSN Previous Signature: 03/25/2023 3:08:21 PM Version By: Dayton Scrape Entered By: Shawn Stall on 03/25/2023 12:14:57 -------------------------------------------------------------------------------- Vitals Details Patient Name: Date of Service: [name] 03/25/2023 2:45 PM Medical Record Number: [id number] Patient Account Number: [id number] Date of Birth/Sex: Treating RN: 1941-01-27 (82 y.o. M) Primary Care Rhone Ozaki: Guerry Bruin Other Clinician: Referring Dorma Altman: Treating Julionna Marczak/Extender: Wyline Beady in Treatment: 5 Vital Signs Time Taken: 02:59 Temperature (F): 98.4 Height (in): 71 Pulse (bpm): 66 Weight (lbs): 193 Respiratory Rate (breaths/min): 18 Body Mass Index (BMI): 26.9 Blood Pressure (mmHg): 148/71 Capillary Blood Glucose (mg/dl): 409 Reference Range: 80 - 120 mg / dl Electronic Signature(s) Signed: 03/25/2023 3:08:21 PM By: Dayton Scrape Entered By: Dayton Scrape  on 03/25/2023 12:00:35

## 2023-03-26 ENCOUNTER — Emergency Department (HOSPITAL_BASED_OUTPATIENT_CLINIC_OR_DEPARTMENT_OTHER): Payer: PPO

## 2023-03-26 ENCOUNTER — Other Ambulatory Visit: Payer: Self-pay

## 2023-03-26 ENCOUNTER — Emergency Department (HOSPITAL_BASED_OUTPATIENT_CLINIC_OR_DEPARTMENT_OTHER): Payer: PPO | Admitting: Radiology

## 2023-03-26 DIAGNOSIS — I428 Other cardiomyopathies: Secondary | ICD-10-CM | POA: Diagnosis not present

## 2023-03-26 DIAGNOSIS — S0990XA Unspecified injury of head, initial encounter: Secondary | ICD-10-CM | POA: Diagnosis not present

## 2023-03-26 DIAGNOSIS — M25512 Pain in left shoulder: Secondary | ICD-10-CM | POA: Insufficient documentation

## 2023-03-26 DIAGNOSIS — W01198A Fall on same level from slipping, tripping and stumbling with subsequent striking against other object, initial encounter: Secondary | ICD-10-CM | POA: Insufficient documentation

## 2023-03-26 DIAGNOSIS — I480 Paroxysmal atrial fibrillation: Secondary | ICD-10-CM | POA: Diagnosis not present

## 2023-03-26 DIAGNOSIS — Q631 Lobulated, fused and horseshoe kidney: Secondary | ICD-10-CM | POA: Diagnosis not present

## 2023-03-26 DIAGNOSIS — Y9389 Activity, other specified: Secondary | ICD-10-CM | POA: Insufficient documentation

## 2023-03-26 DIAGNOSIS — N179 Acute kidney failure, unspecified: Secondary | ICD-10-CM | POA: Diagnosis not present

## 2023-03-26 DIAGNOSIS — I5189 Other ill-defined heart diseases: Secondary | ICD-10-CM | POA: Diagnosis not present

## 2023-03-26 DIAGNOSIS — R809 Proteinuria, unspecified: Secondary | ICD-10-CM | POA: Diagnosis not present

## 2023-03-26 DIAGNOSIS — G500 Trigeminal neuralgia: Secondary | ICD-10-CM | POA: Diagnosis not present

## 2023-03-26 DIAGNOSIS — M542 Cervicalgia: Secondary | ICD-10-CM | POA: Insufficient documentation

## 2023-03-26 DIAGNOSIS — I129 Hypertensive chronic kidney disease with stage 1 through stage 4 chronic kidney disease, or unspecified chronic kidney disease: Secondary | ICD-10-CM | POA: Diagnosis not present

## 2023-03-26 DIAGNOSIS — E119 Type 2 diabetes mellitus without complications: Secondary | ICD-10-CM | POA: Insufficient documentation

## 2023-03-26 DIAGNOSIS — E1122 Type 2 diabetes mellitus with diabetic chronic kidney disease: Secondary | ICD-10-CM | POA: Diagnosis not present

## 2023-03-26 DIAGNOSIS — N2581 Secondary hyperparathyroidism of renal origin: Secondary | ICD-10-CM | POA: Diagnosis not present

## 2023-03-26 DIAGNOSIS — D631 Anemia in chronic kidney disease: Secondary | ICD-10-CM | POA: Diagnosis not present

## 2023-03-26 DIAGNOSIS — N183 Chronic kidney disease, stage 3 unspecified: Secondary | ICD-10-CM | POA: Diagnosis not present

## 2023-03-26 NOTE — Progress Notes (Signed)
inflammation of right lower extremity Erik, [name] ([id number]) [id number] Page 5 of 6 Plan Follow-up Appointments: Return Appointment in 1 week. - w/ Dr. Leanord Hawking cancel appt. for 10/31 Other: - Culture taken- will call you once the results return.- RESULTS IN -MD notified (03/20/23) If redness streaks up leg or passed the skin marking around the heel go to the emergency department. Anesthetic: Wound #1 Right Calcaneus: (In clinic) Topical Lidocaine 5% applied to wound bed Bathing/ Shower/ Hygiene: May shower with protection but do not get wound dressing(s) wet. Protect dressing(s) with water repellant cover (for example, large plastic bag) or a cast cover and may then take shower. - purchase cast protector from [facility], [facility] or [employer] Off-Loading: T Contact Cast to Right Lower Extremity - Prepare for first TCC on 03/25/23 wear loose leg pants then return 02/3123 for TCC change, after initial otal placement Wedge shoe to: - heel offloading shoe- wear while walking and standing. Mininize walking and standing. WOUND #1: - Calcaneus Wound Laterality: Right Cleanser: Soap and Water 1 x Per Week/30 Days Discharge Instructions: May shower and wash wound with dial antibacterial soap and water prior to dressing change. Cleanser: Vashe 5.8 (oz) 1 x Per Week/30 Days Discharge Instructions: Cleanse the wound with Vashe prior to applying a clean dressing using gauze sponges, not tissue or cotton balls. Peri-Wound Care: Triamcinolone 15 (g) 1 x Per Week/30 Days Discharge Instructions: Use triamcinolone 15 (g) as directed Peri-Wound Care: Sween Lotion (Moisturizing lotion) 1 x Per Week/30 Days Discharge Instructions: Apply moisturizing lotion as directed Topical: Mupirocin Ointment 1 x Per Week/30 Days Discharge Instructions: Apply Mupirocin (Bactroban) as  instructed Prim Dressing: Maxorb Extra Ag+ Alginate Dressing, 2x2 (in/in) 1 x Per Week/30 Days ary Discharge Instructions: Apply to wound bed as instructed Secondary Dressing: ALLEVYN Heel 4 1/2in x 5 1/2in / 10.5cm x 13.5cm 1 x Per Week/30 Days Discharge Instructions: Apply over primary dressing as directed. Secondary Dressing: Woven Gauze Sponge, Non-Sterile 4x4 in 1 x Per Week/30 Days Discharge Instructions: Apply over primary dressing as directed. Secured With: Coban Self-Adherent Wrap 4x5 (in/yd) 1 x Per Week/30 Days Discharge Instructions: Secure with Coban as directed. Secured With: [employer], 4.5x3.1 (in/yd) 1 x Per Week/30 Days Discharge Instructions: Secure with Kerlix as directed. 1. I am continuing with the mupirocin and silver alginate. 2. Using the offloading shoe. I had some intentions of putting him at a total contact cast today as things look really quite a bit better than last week I elected to put this off for another week. 3. Cautioned him to keep the pressure off this he will. Judging by his wife's reaction I am not sure how much he is doing this. 4. No evidence of infection no further antibiotics Electronic Signature(s) Signed: 03/25/2023 5:13:18 PM By: [name] MD Entered By: [name] on 03/25/2023 16:09:04 -------------------------------------------------------------------------------- SuperBill Details Patient Name: Date of Service: [name] 03/25/2023 Medical Record Number: [id number] Patient Account Number: [id number] Date of Birth/Sex: Treating RN: 02-Nov-1940 (82 y.o. 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 The wound is measuring smaller today but there is undermining at 0.9 cm almost circumferentially Electronic Signature(s) Signed: 03/25/2023 5:13:18 PM By: [name] MD Entered By: [name] on 03/25/2023  16:07:10 -------------------------------------------------------------------------------- Physical Exam Details Patient Name: Date of Service: [name] 03/25/2023 2:45 PM Medical Record Number: [id number] Patient Account Number: [id number] Date of Birth/Sex: Treating RN: 1941-02-10 (82 y.o. M) Primary Care Provider: Guerry Bruin Other Clinician: Referring Provider: Treating Provider/Extender: Wyline Beady in Treatment: 5 Constitutional Sitting or standing Blood Pressure is within target range for patient.. Pulse regular and within target range for patient.[name] Respirations regular, non-labored and within target range.. Temperature is normal and within the target range for the patient.[name] Appears in no distress. Erik [name], Erik [name] ([id number]) [id number] Page 2 of 6 Notes Wound exam punched-out area on the right heel. No debridement is necessary. The base of this in terms of the granulation feels better. There is 0.9 cm of undermining however. No erythema especially the area that was spreading towards the tip of the heel. No crepitus no purulence Electronic Signature(s) Signed: 03/25/2023 5:13:18 PM By: [name] MD Entered By: [name] on 03/25/2023 16:08:04 -------------------------------------------------------------------------------- Physician Orders Details Patient Name: Date of Service: [name] 03/25/2023 2:45 PM Medical Record Number: [id number] Patient Account Number: [id number] Date of Birth/Sex: Treating RN: 1940-07-29 (82 y.o. Erik [name] Primary Care Provider: Guerry Bruin Other Clinician: Referring Provider: Treating Provider/Extender: Wyline Beady in Treatment: 5 Verbal / Phone Orders: No Diagnosis Coding Follow-up Appointments ppointment in 1 week. - w/ Dr. Leanord Hawking Return A cancel appt. for 10/31 Other: - Culture taken- will call you once the  results return.- RESULTS IN -MD notified (03/20/23) If redness streaks up leg or passed the skin marking around the heel go to the emergency department. Anesthetic Wound #1 Right Calcaneus (In clinic) Topical Lidocaine 5% applied to wound bed Bathing/ Shower/ Hygiene May shower with protection but do not get wound dressing(s) wet. Protect dressing(s) with water repellant cover (for example, large plastic bag) or a cast cover and may then take shower. - purchase cast protector from [facility], [facility] or [employer] Off-Loading Total Contact Cast to Right Lower Extremity - Prepare for first TCC on 03/25/23 wear loose leg pants - NOT YET NO TCC PLACED , then return 02/3123 for TCC change, after initial placement-NOT YET NO TCC PLACED , Wedge shoe to: - heel offloading shoe- wear while walking and standing. Mininize walking and standing. Wound Treatment Wound #1 - Calcaneus Wound Laterality: Right Cleanser: Soap and Water 1 x Per Week/30 Days Discharge Instructions: May shower and wash wound with dial antibacterial soap and water prior to dressing change. Cleanser: Vashe 5.8 (oz) 1 x Per Week/30 Days Discharge Instructions: Cleanse the wound with Vashe prior to applying a clean dressing using gauze sponges, not tissue or cotton balls. Peri-Wound Care: Triamcinolone 15 (g) 1 x Per Week/30 Days Discharge Instructions: Use triamcinolone 15 (g) as directed Peri-Wound Care: Sween Lotion (Moisturizing lotion) 1 x Per Week/30 Days Discharge Instructions: Apply moisturizing lotion as directed Topical: Mupirocin Ointment 1 x Per Week/30 Days Discharge Instructions: Apply Mupirocin (Bactroban) as instructed Prim Dressing: Maxorb Extra Ag+ Alginate Dressing, 2x2 (in/in) 1 x Per Week/30 Days ary Discharge Instructions: Apply to wound bed as instructed Secondary Dressing: ALLEVYN Heel 4 1/2in x 5 1/2in / 10.5cm x 13.5cm 1 x Per Week/30 Days Discharge Instructions: Apply over primary  have been using mupirocin and silver alginate. The wound is measuring smaller today but there is undermining at 0.9 cm almost circumferentially Electronic Signature(s) Signed: 03/25/2023 5:13:18 PM By: [name] MD Entered By: [name] on 03/25/2023  16:07:10 -------------------------------------------------------------------------------- Physical Exam Details Patient Name: Date of Service: [name] 03/25/2023 2:45 PM Medical Record Number: [id number] Patient Account Number: [id number] Date of Birth/Sex: Treating RN: 1941-02-10 (82 y.o. M) Primary Care Provider: Guerry Bruin Other Clinician: Referring Provider: Treating Provider/Extender: Wyline Beady in Treatment: 5 Constitutional Sitting or standing Blood Pressure is within target range for patient.. Pulse regular and within target range for patient.[name] Respirations regular, non-labored and within target range.. Temperature is normal and within the target range for the patient.[name] Appears in no distress. Erik [name], Erik [name] ([id number]) [id number] Page 2 of 6 Notes Wound exam punched-out area on the right heel. No debridement is necessary. The base of this in terms of the granulation feels better. There is 0.9 cm of undermining however. No erythema especially the area that was spreading towards the tip of the heel. No crepitus no purulence Electronic Signature(s) Signed: 03/25/2023 5:13:18 PM By: [name] MD Entered By: [name] on 03/25/2023 16:08:04 -------------------------------------------------------------------------------- Physician Orders Details Patient Name: Date of Service: [name] 03/25/2023 2:45 PM Medical Record Number: [id number] Patient Account Number: [id number] Date of Birth/Sex: Treating RN: 1940-07-29 (82 y.o. Erik [name] Primary Care Provider: Guerry Bruin Other Clinician: Referring Provider: Treating Provider/Extender: Wyline Beady in Treatment: 5 Verbal / Phone Orders: No Diagnosis Coding Follow-up Appointments ppointment in 1 week. - w/ Dr. Leanord Hawking Return A cancel appt. for 10/31 Other: - Culture taken- will call you once the  results return.- RESULTS IN -MD notified (03/20/23) If redness streaks up leg or passed the skin marking around the heel go to the emergency department. Anesthetic Wound #1 Right Calcaneus (In clinic) Topical Lidocaine 5% applied to wound bed Bathing/ Shower/ Hygiene May shower with protection but do not get wound dressing(s) wet. Protect dressing(s) with water repellant cover (for example, large plastic bag) or a cast cover and may then take shower. - purchase cast protector from [facility], [facility] or [employer] Off-Loading Total Contact Cast to Right Lower Extremity - Prepare for first TCC on 03/25/23 wear loose leg pants - NOT YET NO TCC PLACED , then return 02/3123 for TCC change, after initial placement-NOT YET NO TCC PLACED , Wedge shoe to: - heel offloading shoe- wear while walking and standing. Mininize walking and standing. Wound Treatment Wound #1 - Calcaneus Wound Laterality: Right Cleanser: Soap and Water 1 x Per Week/30 Days Discharge Instructions: May shower and wash wound with dial antibacterial soap and water prior to dressing change. Cleanser: Vashe 5.8 (oz) 1 x Per Week/30 Days Discharge Instructions: Cleanse the wound with Vashe prior to applying a clean dressing using gauze sponges, not tissue or cotton balls. Peri-Wound Care: Triamcinolone 15 (g) 1 x Per Week/30 Days Discharge Instructions: Use triamcinolone 15 (g) as directed Peri-Wound Care: Sween Lotion (Moisturizing lotion) 1 x Per Week/30 Days Discharge Instructions: Apply moisturizing lotion as directed Topical: Mupirocin Ointment 1 x Per Week/30 Days Discharge Instructions: Apply Mupirocin (Bactroban) as instructed Prim Dressing: Maxorb Extra Ag+ Alginate Dressing, 2x2 (in/in) 1 x Per Week/30 Days ary Discharge Instructions: Apply to wound bed as instructed Secondary Dressing: ALLEVYN Heel 4 1/2in x 5 1/2in / 10.5cm x 13.5cm 1 x Per Week/30 Days Discharge Instructions: Apply over primary  have been using mupirocin and silver alginate. The wound is measuring smaller today but there is undermining at 0.9 cm almost circumferentially Electronic Signature(s) Signed: 03/25/2023 5:13:18 PM By: [name] MD Entered By: [name] on 03/25/2023  16:07:10 -------------------------------------------------------------------------------- Physical Exam Details Patient Name: Date of Service: [name] 03/25/2023 2:45 PM Medical Record Number: [id number] Patient Account Number: [id number] Date of Birth/Sex: Treating RN: 1941-02-10 (82 y.o. M) Primary Care Provider: Guerry Bruin Other Clinician: Referring Provider: Treating Provider/Extender: Wyline Beady in Treatment: 5 Constitutional Sitting or standing Blood Pressure is within target range for patient.. Pulse regular and within target range for patient.[name] Respirations regular, non-labored and within target range.. Temperature is normal and within the target range for the patient.[name] Appears in no distress. Erik [name], Erik [name] ([id number]) [id number] Page 2 of 6 Notes Wound exam punched-out area on the right heel. No debridement is necessary. The base of this in terms of the granulation feels better. There is 0.9 cm of undermining however. No erythema especially the area that was spreading towards the tip of the heel. No crepitus no purulence Electronic Signature(s) Signed: 03/25/2023 5:13:18 PM By: [name] MD Entered By: [name] on 03/25/2023 16:08:04 -------------------------------------------------------------------------------- Physician Orders Details Patient Name: Date of Service: [name] 03/25/2023 2:45 PM Medical Record Number: [id number] Patient Account Number: [id number] Date of Birth/Sex: Treating RN: 1940-07-29 (82 y.o. Erik [name] Primary Care Provider: Guerry Bruin Other Clinician: Referring Provider: Treating Provider/Extender: Wyline Beady in Treatment: 5 Verbal / Phone Orders: No Diagnosis Coding Follow-up Appointments ppointment in 1 week. - w/ Dr. Leanord Hawking Return A cancel appt. for 10/31 Other: - Culture taken- will call you once the  results return.- RESULTS IN -MD notified (03/20/23) If redness streaks up leg or passed the skin marking around the heel go to the emergency department. Anesthetic Wound #1 Right Calcaneus (In clinic) Topical Lidocaine 5% applied to wound bed Bathing/ Shower/ Hygiene May shower with protection but do not get wound dressing(s) wet. Protect dressing(s) with water repellant cover (for example, large plastic bag) or a cast cover and may then take shower. - purchase cast protector from [facility], [facility] or [employer] Off-Loading Total Contact Cast to Right Lower Extremity - Prepare for first TCC on 03/25/23 wear loose leg pants - NOT YET NO TCC PLACED , then return 02/3123 for TCC change, after initial placement-NOT YET NO TCC PLACED , Wedge shoe to: - heel offloading shoe- wear while walking and standing. Mininize walking and standing. Wound Treatment Wound #1 - Calcaneus Wound Laterality: Right Cleanser: Soap and Water 1 x Per Week/30 Days Discharge Instructions: May shower and wash wound with dial antibacterial soap and water prior to dressing change. Cleanser: Vashe 5.8 (oz) 1 x Per Week/30 Days Discharge Instructions: Cleanse the wound with Vashe prior to applying a clean dressing using gauze sponges, not tissue or cotton balls. Peri-Wound Care: Triamcinolone 15 (g) 1 x Per Week/30 Days Discharge Instructions: Use triamcinolone 15 (g) as directed Peri-Wound Care: Sween Lotion (Moisturizing lotion) 1 x Per Week/30 Days Discharge Instructions: Apply moisturizing lotion as directed Topical: Mupirocin Ointment 1 x Per Week/30 Days Discharge Instructions: Apply Mupirocin (Bactroban) as instructed Prim Dressing: Maxorb Extra Ag+ Alginate Dressing, 2x2 (in/in) 1 x Per Week/30 Days ary Discharge Instructions: Apply to wound bed as instructed Secondary Dressing: ALLEVYN Heel 4 1/2in x 5 1/2in / 10.5cm x 13.5cm 1 x Per Week/30 Days Discharge Instructions: Apply over primary  Erik, [name] ([id number]) [id number] Page 1 of 6 Visit Report for 03/25/2023 HPI Details Patient Name: Date of Service: TO Erik [name]. 03/25/2023 2:45 PM Medical Record Number: [id number] Patient Account Number: [id number] Date of Birth/Sex: Treating RN: [birth year] (82 y.o. M) Primary Care Provider: Guerry Bruin Other Clinician: Referring Provider: Treating Provider/Extender: Wyline Beady in Treatment: 5 History of Present Illness HPI Description: ADMISSION 02/18/2023 This is an 82 year old still very active man who was referred to [place] by Dr. Ardelle Anton at [facility]. He has a nonhealing wound on the plantar aspect of his right heel. He has been dealing with this since June when he presented to Dr. Loreta Ave with a painful area on the right mid heel. He was not really sure how this happened. He ultimately had an MRI in July that showed no osteomyelitis but there was a small abscess he was treated with antibiotics doxycycline and gradually the pain improved but he still been left with a small open area. He had an ABI on 11/14/2022 that was 1.08 on the right and 0.99 on the left. He by enlarge has been using Silvadene on the wound. Recently changed to silver alginate. He also developed a blister on the right anterior lower leg. Recent x-rays have not shown any cortical changes. Also in June he had a sedimentation rate of 2 and a C-reactive protein of 14 not suggestive of chronic active infection Past medical history includes type 2 diabetes, nonischemic cardiomyopathy, hypertension cholesterolemia, skin cancer, combined congestive heart failure, paroxysmal A-fib, idiopathic trigeminal neuralgia. 10/1; ulcer on the plantar aspect of his right heel. He is being compliant with the healing boot. We have been using Hydrofera Blue heel cup gauze. Dimensions are better 10/7; the area on the leg right anterior is healed. He has chronic  insufficiency. Unfortunately the ulcer on the plantar aspect of his right heel looks about the same each time with undermining the skin is not adherent to the underlying tissue. This requires debridement. We have been using Hydrofera Blue, heel cup and heel offloading shoe. We are not making progress with the heel ulcer. 10/15; I brought the patient in today with the intention of putting him in a total contact cast if the wound on his plantar heel on the right was no better. However he comes in with erythema spreading towards the tip of his heel. There is also bogginess. There is no crepitus. He is a very active man. He is in the heel off loader but I do not think for 1 reason or another this is doing the job in terms of pressure and friction relief. I am concerned enough about the wound today not to put on a total contact cast 10/22; culture I did last week showed Staphylococcus epidermidis which is likely a skin contaminant but he also grew Staphylococcus capitis which was resistant to the doxycycline I put him on. I am going to give him a week of trimethoprim/sulfamethoxazole for this. I am not sure of the relevance of the coag negative staph however I do not want to put a cast and something that is infected. We have been using Iodoflex 10/29; patient with a right plantar heel wound. This was initially probably infectious with an MRI showing a small abscess but no osteomyelitis. 2 weeks ago he had erythema around the wound and bogginess but no crepitus I treated him with a prolonged course of antibiotics last week giving and will further week of Septra. We  inflammation of right lower extremity Erik, [name] ([id number]) [id number].pdf Page 5 of 6 Plan Follow-up Appointments: Return Appointment in 1 week. - w/ Dr. Leanord Hawking cancel appt. for 10/31 Other: - Culture taken- will call you once the results return.- RESULTS IN -MD notified (03/20/23) If redness streaks up leg or passed the skin marking around the heel go to the emergency department. Anesthetic: Wound #1 Right Calcaneus: (In clinic) Topical Lidocaine 5% applied to wound bed Bathing/ Shower/ Hygiene: May shower with protection but do not get wound dressing(s) wet. Protect dressing(s) with water repellant cover (for example, large plastic bag) or a cast cover and may then take shower. - purchase cast protector from [facility], [facility] or [employer] Off-Loading: T Contact Cast to Right Lower Extremity - Prepare for first TCC on 03/25/23 wear loose leg pants then return 02/3123 for TCC change, after initial otal placement Wedge shoe to: - heel offloading shoe- wear while walking and standing. Mininize walking and standing. WOUND #1: - Calcaneus Wound Laterality: Right Cleanser: Soap and Water 1 x Per Week/30 Days Discharge Instructions: May shower and wash wound with dial antibacterial soap and water prior to dressing change. Cleanser: Vashe 5.8 (oz) 1 x Per Week/30 Days Discharge Instructions: Cleanse the wound with Vashe prior to applying a clean dressing using gauze sponges, not tissue or cotton balls. Peri-Wound Care: Triamcinolone 15 (g) 1 x Per Week/30 Days Discharge Instructions: Use triamcinolone 15 (g) as directed Peri-Wound Care: Sween Lotion (Moisturizing lotion) 1 x Per Week/30 Days Discharge Instructions: Apply moisturizing lotion as directed Topical: Mupirocin Ointment 1 x Per Week/30 Days Discharge Instructions: Apply Mupirocin (Bactroban) as  instructed Prim Dressing: Maxorb Extra Ag+ Alginate Dressing, 2x2 (in/in) 1 x Per Week/30 Days ary Discharge Instructions: Apply to wound bed as instructed Secondary Dressing: ALLEVYN Heel 4 1/2in x 5 1/2in / 10.5cm x 13.5cm 1 x Per Week/30 Days Discharge Instructions: Apply over primary dressing as directed. Secondary Dressing: Woven Gauze Sponge, Non-Sterile 4x4 in 1 x Per Week/30 Days Discharge Instructions: Apply over primary dressing as directed. Secured With: Coban Self-Adherent Wrap 4x5 (in/yd) 1 x Per Week/30 Days Discharge Instructions: Secure with Coban as directed. Secured With: [employer], 4.5x3.1 (in/yd) 1 x Per Week/30 Days Discharge Instructions: Secure with Kerlix as directed. 1. I am continuing with the mupirocin and silver alginate. 2. Using the offloading shoe. I had some intentions of putting him at a total contact cast today as things look really quite a bit better than last week I elected to put this off for another week. 3. Cautioned him to keep the pressure off this he will. Judging by his wife's reaction I am not sure how much he is doing this. 4. No evidence of infection no further antibiotics Electronic Signature(s) Signed: 03/25/2023 5:13:18 PM By: [name] MD Entered By: [name] on 03/25/2023 16:09:04 -------------------------------------------------------------------------------- SuperBill Details Patient Name: Date of Service: [name] 03/25/2023 Medical Record Number: [id number] Patient Account Number: [id number] Date of Birth/Sex: Treating RN: 02-Nov-1940 (82 y.o. 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

## 2023-03-26 NOTE — ED Triage Notes (Signed)
Pt POV after falling. Pt was holding litter container and tripped over cat. Hit head on iron pipe, no LOC. Also reports L shoulder pain. No blood thinners, axo x4 NAD at this time

## 2023-03-27 ENCOUNTER — Emergency Department (HOSPITAL_BASED_OUTPATIENT_CLINIC_OR_DEPARTMENT_OTHER)
Admission: EM | Admit: 2023-03-27 | Discharge: 2023-03-27 | Disposition: A | Discharge status: Home / Self Care | Payer: PPO | Attending: Emergency Medicine | Admitting: Emergency Medicine

## 2023-03-27 ENCOUNTER — Ambulatory Visit (HOSPITAL_BASED_OUTPATIENT_CLINIC_OR_DEPARTMENT_OTHER): Payer: PPO | Admitting: Internal Medicine

## 2023-03-27 DIAGNOSIS — S0990XA Unspecified injury of head, initial encounter: Secondary | ICD-10-CM

## 2023-03-27 DIAGNOSIS — S43402A Unspecified sprain of left shoulder joint, initial encounter: Secondary | ICD-10-CM

## 2023-03-27 DIAGNOSIS — S161XXA Strain of muscle, fascia and tendon at neck level, initial encounter: Secondary | ICD-10-CM

## 2023-03-27 LAB — CBG MONITORING, ED: Glucose-Capillary: 136 mg/dL — ABNORMAL HIGH (ref 70–99)

## 2023-03-27 NOTE — ED Provider Notes (Signed)
[facility] AT [facility] Provider Note   CSN: [id number] Arrival date & time: 03/26/23  2157     History  Chief Complaint  Patient presents with   Erik [name] is a 82 y.o. male.  Patient is an 82 year old male with past medical history of diabetes, chronic renal insufficiency, hyperlipidemia, nonischemic cardiomyopathy.  Patient presenting today for evaluation of a fall.  He was carrying a bucket of water and cat litter when he tripped over the cat and fell backward striking his head on an iron pipe.  He describes some discomfort in his neck and the back of his head.  He denies loss of consciousness.  He also has some pain in his surgically repaired left shoulder, but denies other injury.  The history is provided by the patient.       Home Medications Prior to Admission medications   Medication Sig Start Date End Date Taking? Authorizing Provider  acetaminophen (TYLENOL) 325 MG tablet Take 2 tablets (650 mg total) by mouth every 6 (six) hours as needed for mild pain (or Fever >/= 101). 01/29/15   Elgergawy, Leana Roe, MD  atorvastatin (LIPITOR) 40 MG tablet Take 1 tablet by mouth daily.    [provider]  Baclofen 5 MG TABS Take 1 tablet (5 mg total) by mouth at bedtime as needed. Patient not taking: Reported on 03/21/2023 12/07/22   Raspet, Noberto Retort, PA-C  BD PEN NEEDLE NANO U/F 32G X 4 MM MISC USE 1 NEEDLE WITH INSULIN INJECTION ONCE DAILY 03/04/18   [provider]  Blood Glucose Calibration (TRUE METRIX LEVEL 1) Low SOLN USE TO CALIBRATE GLUCOMETER ONCE MONTHY OR WITH EACH NEW VIAL OF STRIPS, WHICHEVER COMES FIRST 10/13/17   [provider]  Blood Glucose Monitoring Suppl (TRUE METRIX AIR GLUCOSE METER) w/Device KIT use to check blood sugar three times daily prior to meals. E11.29 10/09/17   [provider]  Coenzyme Q10 (COQ10) 200 MG CAPS Take 100 mg by mouth daily.     [provider]   Continuous Blood Gluc Receiver (FREESTYLE LIBRE 2 READER) DEVI See admin instructions. 12/26/21   [provider]  Continuous Blood Gluc Sensor (FREESTYLE LIBRE 2 SENSOR) MISC Inject into the skin every 14 (fourteen) days. 03/19/22   [provider]  dapagliflozin propanediol (FARXIGA) 10 MG TABS tablet Take 1 tablet (10 mg total) by mouth daily before breakfast. 04/10/22   Meriam Sprague, MD  doxycycline (VIBRA-TABS) 100 MG tablet Take 1 tablet (100 mg total) by mouth 2 (two) times daily. Patient not taking: Reported on 02/04/2023 12/27/22   Vivi Barrack, DPM  finasteride (PROSCAR) 5 MG tablet Take 5 mg by mouth once daily 03/06/17   [provider]  furosemide (LASIX) 40 MG tablet Take 1 tablet (40 mg total) by mouth daily 04/23/22   Sharlene Dory, PA-C  gabapentin (NEURONTIN) 300 MG capsule Take 1 capsule (300 mg total) by mouth 2 (two) times daily. 02/04/23   Patel, Roxana Hires K, DO  glucose blood (TRUE METRIX BLOOD GLUCOSE TEST) test strip use to check blood sugar three times daily prior to meals. E11.29 10/09/17   [provider]  ipratropium (ATROVENT) 0.03 % nasal spray  06/05/20   [provider]  lidocaine (LIDODERM) 5 % Place 1 patch onto the skin daily. Remove & Discard patch within 12 hours or as directed by MD Patient not taking: Reported on 03/21/2023 12/07/22   Raspet, Denny Peon  K, PA-C  metoprolol succinate (TOPROL XL) 50 MG 24 hr tablet Take 1.5 tablets (75 mg total) by mouth daily. Take with or immediately following a meal. 10/04/22   Pemberton, Kathlynn Grate, MD  moxifloxacin (VIGAMOX) 0.5 % ophthalmic solution Place 1 drop into the left eye 4 (four) times daily. 11/11/19   [provider]  Multiple Vitamin (MULTIVITAMIN) capsule Take 1 capsule by mouth daily.    [provider]  OXcarbazepine (TRILEPTAL) 150 MG tablet Take 1 tablet (150 mg total) by mouth 2 (two) times daily. Patient not taking: Reported on 03/21/2023 02/19/22    Nita Sickle K, DO  pantoprazole (PROTONIX) 20 MG tablet Take 20 mg by mouth daily. 12/25/21   [provider]  potassium chloride SA (KLOR-CON M) 20 MEQ tablet Take 1 tablet (20 mEq total) by mouth daily for 5 days only.  Take in concurrence with 5 day increased lasix. Patient not taking: Reported on 03/21/2023 04/11/22   Meriam Sprague, MD  PROLENSA 0.07 % SOLN  11/11/19   [provider]  Rivaroxaban (XARELTO) 15 MG TABS tablet TAKE 1 TABLET(15 MG) BY MOUTH DAILY WITH SUPPER Patient not taking: Reported on 03/21/2023 06/01/21   Meriam Sprague, MD  selenium 200 MCG TABS tablet Take 200 mcg by mouth daily.    [provider]  silver sulfADIAZINE (SILVADENE) 1 % cream Apply 1 Application topically daily. 11/12/22   Vivi Barrack, DPM  tamsulosin (FLOMAX) 0.4 MG CAPS capsule Take 0.4 mg by mouth at bedtime.    [provider]  TOUJEO SOLOSTAR 300 UNIT/ML SOPN Inject 36 Units as directed daily.  03/12/16   [provider]  Trospium Chloride 60 MG CP24  02/02/18   [provider]  TRUEplus Lancets 33G MISC use to check blood sugar three times daily prior to meals. E11.29 10/09/17   [provider]      Allergies    Claritin [loratadine], Tradjenta [linagliptin], and Imipramine    Review of Systems   Review of Systems  All other systems reviewed and are negative.   Physical Exam Updated Vital Signs BP (!) 131/58 (BP Location: Right Arm)   Pulse 69   Temp 98.4 F (36.9 C)   Resp 18   Ht 5\' 11"  (1.803 m)   Wt 86.2 kg   SpO2 98%   BMI 26.50 kg/m  Physical Exam Vitals and nursing note reviewed.  Constitutional:      General: He is not in acute distress.    Appearance: He is well-developed. He is not diaphoretic.  HENT:     Head: Normocephalic and atraumatic.  Eyes:     Extraocular Movements: Extraocular movements intact.     Pupils: Pupils are equal, round, and reactive to light.  Neck:     Comments: There  is mild tenderness in the soft tissues of the posterior cervical region.  No bony tenderness or step-off. Cardiovascular:     Rate and Rhythm: Normal rate and regular rhythm.     Heart sounds: No murmur heard.    No friction rub.  Pulmonary:     Effort: Pulmonary effort is normal. No respiratory distress.     Breath sounds: Normal breath sounds. No wheezing or rales.  Abdominal:     General: Bowel sounds are normal. There is no distension.     Palpations: Abdomen is soft.     Tenderness: There is no abdominal tenderness.  Musculoskeletal:        General: Normal  range of motion.     Cervical back: Normal range of motion and neck supple.     Comments: The left shoulder is grossly normal in appearance.  He has good range of motion with minimal discomfort.  Ulnar and radial pulses are palpable and motor and sensation are intact throughout the entire hand.  Skin:    General: Skin is warm and dry.  Neurological:     General: No focal deficit present.     Mental Status: He is alert and oriented to person, place, and time.     Cranial Nerves: No cranial nerve deficit.     Motor: No weakness.     Coordination: Coordination normal.     ED Results / Procedures / Treatments   Labs (all labs ordered are listed, but only abnormal results are displayed) Labs Reviewed  CBG MONITORING, ED - Abnormal; Notable for the following components:      Result Value   Glucose-Capillary 136 (*)    All other components within normal limits    EKG None  Radiology DG Shoulder Left  Result Date: 03/26/2023 CLINICAL DATA:  Tripped over CT and hit head on iron pipe. Also has left shoulder pain EXAM: LEFT SHOULDER - 2+ VIEW COMPARISON:  Radiographs 08/28/2007 FINDINGS: Left TSA. No radiographic evidence of loosening. No acute fracture. Superior subluxation of the humeral head prosthesis in relation to the glenoid. IMPRESSION: 1. No evidence of acute fracture. 2. Superior subluxation of the humeral head  prosthesis in relation to the glenoid may be due to rotator cuff pathology. Electronically Signed   By: Minerva Fester M.D.   On: 03/26/2023 23:47   CT Head Wo Contrast  Result Date: 03/26/2023 CLINICAL DATA:  Tripped over CT head on iron pipe. Head trauma with focal neuro findings. Neck trauma EXAM: CT HEAD WITHOUT CONTRAST CT CERVICAL SPINE WITHOUT CONTRAST TECHNIQUE: Multidetector CT imaging of the head and cervical spine was performed following the standard protocol without intravenous contrast. Multiplanar CT image reconstructions of the cervical spine were also generated. RADIATION DOSE REDUCTION: This exam was performed according to the departmental dose-optimization program which includes automated exposure control, adjustment of the mA and/or kV according to patient size and/or use of iterative reconstruction technique. COMPARISON:  CT head 10/18/2005 FINDINGS: CT HEAD FINDINGS Brain: No intracranial hemorrhage, mass effect, or evidence of acute infarct. No hydrocephalus. No extra-axial fluid collection. Age related cerebral atrophy and chronic small vessel ischemic disease. Vascular: No hyperdense vessel. Intracranial arterial calcification. Skull: No fracture or focal lesion.  Right posterior scalp hematoma. Sinuses/Orbits: No acute finding. Other: None. CT CERVICAL SPINE FINDINGS Alignment: No evidence of traumatic malalignment. Skull base and vertebrae: No acute fracture. No primary bone lesion or focal pathologic process. Soft tissues and spinal canal: No prevertebral fluid or swelling. No visible canal hematoma. Disc levels: Multilevel spondylosis. Complete fusion of anterior osteophytes in the visualized cervicothoracic spine. Multilevel facet arthropathy. No severe spinal canal narrowing. Upper chest: No acute abnormality. Other: Carotid calcification. IMPRESSION: 1. No acute intracranial abnormality. Generalized atrophy and small vessel white matter disease. 2. No acute fracture in the  cervical spine. Multilevel degenerative spondylosis with complete fusion of anterior osteophytes in the visualized cervicothoracic spine. Electronically Signed   By: Minerva Fester M.D.   On: 03/26/2023 23:28   CT Cervical Spine Wo Contrast  Result Date: 03/26/2023 CLINICAL DATA:  Tripped over CT head on iron pipe. Head trauma with focal neuro findings. Neck trauma EXAM: CT HEAD WITHOUT CONTRAST CT CERVICAL  SPINE WITHOUT CONTRAST TECHNIQUE: Multidetector CT imaging of the head and cervical spine was performed following the standard protocol without intravenous contrast. Multiplanar CT image reconstructions of the cervical spine were also generated. RADIATION DOSE REDUCTION: This exam was performed according to the departmental dose-optimization program which includes automated exposure control, adjustment of the mA and/or kV according to patient size and/or use of iterative reconstruction technique. COMPARISON:  CT head 10/18/2005 FINDINGS: CT HEAD FINDINGS Brain: No intracranial hemorrhage, mass effect, or evidence of acute infarct. No hydrocephalus. No extra-axial fluid collection. Age related cerebral atrophy and chronic small vessel ischemic disease. Vascular: No hyperdense vessel. Intracranial arterial calcification. Skull: No fracture or focal lesion.  Right posterior scalp hematoma. Sinuses/Orbits: No acute finding. Other: None. CT CERVICAL SPINE FINDINGS Alignment: No evidence of traumatic malalignment. Skull base and vertebrae: No acute fracture. No primary bone lesion or focal pathologic process. Soft tissues and spinal canal: No prevertebral fluid or swelling. No visible canal hematoma. Disc levels: Multilevel spondylosis. Complete fusion of anterior osteophytes in the visualized cervicothoracic spine. Multilevel facet arthropathy. No severe spinal canal narrowing. Upper chest: No acute abnormality. Other: Carotid calcification. IMPRESSION: 1. No acute intracranial abnormality. Generalized atrophy  and small vessel white matter disease. 2. No acute fracture in the cervical spine. Multilevel degenerative spondylosis with complete fusion of anterior osteophytes in the visualized cervicothoracic spine. Electronically Signed   By: Minerva Fester M.D.   On: 03/26/2023 23:28    Procedures Procedures    Medications Ordered in ED Medications - No data to display  ED Course/ Medical Decision Making/ A&P  Patient presenting after a fall, the details of which are described in the HPI.  Patient arrives with stable vital signs and physical examination which is basically unremarkable.  He does have a contusion to the occipital region of his scalp.  CT scan of the head and cervical spine obtained showing no evidence for bleed or fracture.  X-ray of the left shoulder showing hardware from prior surgery, but no complication.  Patient to be discharged with Tylenol, rest, and as needed return.  Final Clinical Impression(s) / ED Diagnoses Final diagnoses:  None    Rx / DC Orders ED Discharge Orders     None         Geoffery Lyons, MD 03/27/23 0230

## 2023-03-27 NOTE — Discharge Instructions (Signed)
Take Tylenol 650 mg every 6 hours as needed for pain.  Rest.  Return to the emergency department if you develop severe headache, vomiting, alterations in consciousness, or for other new and concerning symptoms.

## 2023-03-31 DIAGNOSIS — I509 Heart failure, unspecified: Secondary | ICD-10-CM | POA: Diagnosis not present

## 2023-03-31 DIAGNOSIS — I130 Hypertensive heart and chronic kidney disease with heart failure and stage 1 through stage 4 chronic kidney disease, or unspecified chronic kidney disease: Secondary | ICD-10-CM | POA: Diagnosis not present

## 2023-03-31 DIAGNOSIS — S0990XD Unspecified injury of head, subsequent encounter: Secondary | ICD-10-CM | POA: Diagnosis not present

## 2023-03-31 DIAGNOSIS — I480 Paroxysmal atrial fibrillation: Secondary | ICD-10-CM | POA: Diagnosis not present

## 2023-03-31 DIAGNOSIS — Z7901 Long term (current) use of anticoagulants: Secondary | ICD-10-CM | POA: Diagnosis not present

## 2023-04-01 ENCOUNTER — Telehealth: Payer: Self-pay | Admitting: Neurology

## 2023-04-01 NOTE — Telephone Encounter (Signed)
1. Which medications need to be refilled? (please list name of each medication and dose if known) OXcarbazepine (TRILEPTAL) 150 MG tablet [[id number]]   2. Which pharmacy/location (including street and city if local pharmacy) is medication to be sent to? [facility]  3. Do they need a 30 day or 90 day supply? 90

## 2023-04-02 MED ORDER — OXCARBAZEPINE 150 MG PO TABS: 75.0000 mg | ORAL_TABLET | Freq: Two times a day (BID) | ORAL | 3 refills | Status: DC

## 2023-04-02 NOTE — Telephone Encounter (Signed)
Called and spoke to patients wife and she confirmed that patient is taking a half a tablet twice a day. Also, informed patient that the rx has been sent to the pharmacy as requested.

## 2023-04-03 ENCOUNTER — Encounter (HOSPITAL_BASED_OUTPATIENT_CLINIC_OR_DEPARTMENT_OTHER): Payer: PPO | Attending: Internal Medicine | Admitting: Internal Medicine

## 2023-04-03 DIAGNOSIS — E1122 Type 2 diabetes mellitus with diabetic chronic kidney disease: Secondary | ICD-10-CM | POA: Insufficient documentation

## 2023-04-03 DIAGNOSIS — E11621 Type 2 diabetes mellitus with foot ulcer: Secondary | ICD-10-CM | POA: Insufficient documentation

## 2023-04-03 DIAGNOSIS — L97418 Non-pressure chronic ulcer of right heel and midfoot with other specified severity: Secondary | ICD-10-CM | POA: Insufficient documentation

## 2023-04-03 DIAGNOSIS — L97412 Non-pressure chronic ulcer of right heel and midfoot with fat layer exposed: Secondary | ICD-10-CM | POA: Diagnosis not present

## 2023-04-03 DIAGNOSIS — N183 Chronic kidney disease, stage 3 unspecified: Secondary | ICD-10-CM | POA: Diagnosis not present

## 2023-04-03 DIAGNOSIS — I87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: Secondary | ICD-10-CM | POA: Diagnosis not present

## 2023-04-03 DIAGNOSIS — Z85828 Personal history of other malignant neoplasm of skin: Secondary | ICD-10-CM | POA: Insufficient documentation

## 2023-04-03 DIAGNOSIS — I428 Other cardiomyopathies: Secondary | ICD-10-CM | POA: Diagnosis not present

## 2023-04-03 DIAGNOSIS — I130 Hypertensive heart and chronic kidney disease with heart failure and stage 1 through stage 4 chronic kidney disease, or unspecified chronic kidney disease: Secondary | ICD-10-CM | POA: Diagnosis not present

## 2023-04-03 DIAGNOSIS — I5042 Chronic combined systolic (congestive) and diastolic (congestive) heart failure: Secondary | ICD-10-CM | POA: Diagnosis not present

## 2023-04-03 DIAGNOSIS — E1151 Type 2 diabetes mellitus with diabetic peripheral angiopathy without gangrene: Secondary | ICD-10-CM | POA: Insufficient documentation

## 2023-04-03 DIAGNOSIS — E1140 Type 2 diabetes mellitus with diabetic neuropathy, unspecified: Secondary | ICD-10-CM | POA: Insufficient documentation

## 2023-04-03 DIAGNOSIS — I480 Paroxysmal atrial fibrillation: Secondary | ICD-10-CM | POA: Insufficient documentation

## 2023-04-04 NOTE — Progress Notes (Unsigned)
Cardiology Office Note:  .   Date:  04/04/2023  ID:  Erik [name], DOB Sep 03, 1940, MRN [id number] PCP: Gaspar Garbe, MD  [facility] Providers Cardiologist:  Meriam Sprague, MD (Inactive) { Click to update primary MD,subspecialty MD or APP then REFRESH:1}   Patient Profile: .      PMH CKD Coronary artery disease PAF on chronic anticoagulation NICM EF 35-40 om 2009, improved to 55% EF 45-50 in 2023 Type 2 DM Hyperlipidemia Hypertension  Nonobstructive CAD by cath 2004.  Nuclear stress test 09/2013 showed no ischemia, fixed defect suggestive of possible prior infarction felt to be low risk, EF 48%.  In 2015 during admission for A-fib RVR he had a decline in EF.  CKD followed by Dr. [name] at [facility].  Previously followed by Dr. Delton See he transferred care to Dr. Shari Prows.  TTE 08/2021 showed LVEF 45 to 50% with global hypokinesis, normal RV function, mild pulmonary hypertension, mild MR.  Seen in November 2023 with bilateral lower extremity edema, right greater than left for 2 to 3 months.  His nephrologist started him on Lasix 40 mg daily 2 weeks prior and symptoms were improving.  His weight was down 4 pounds.  Seen by me on 10/23/2022 after showing up on the wrong day for his appointment.  He was ambulating with a walker due to back pain.  He had suffered a mechanical fall proximately 3 weeks prior, missed a step, hit his head and hurt his arm but says he fell pretty gently.  He was having continuous back and hip pain awaiting tests with PCP.  He continued to have leg swelling which "comes and goes."  His weight was stable and he was felt to be euvolemic.  GDMT for CHF included the pack low flows and, metoprolol, and furosemide.  Xarelto dose 15 mg for stroke prevention due to creatinine clearance calculated at 36 mL/min.  He had no concerning cardiac symptoms and 48-month follow-up was recommended.       History of Present Illness: .   Erik [name] is a very pleasant 82 y.o. male ***  Off Xarelto Seen at [facility] 03/26/23 with Scr 1.79, baseline 1.6-1.8  Discussed the use of AI scribe software for clinical note transcription with the patient, who gave verbal consent to proceed.   ROS: ***       Studies Reviewed: .        *** Risk Assessment/Calculations:    CHA2DS2-VASc Score = 5  {Confirm score is correct.  If not, click here to update score.  REFRESH note.  :1} This indicates a 7.2% annual risk of stroke. The patient's score is based upon: CHF History: 1 HTN History: 1 Diabetes History: 1 Stroke History: 0 Vascular Disease History: 0 Age Score: 2 Gender Score: 0   {This patient has a significant risk of stroke if diagnosed with atrial fibrillation.  Please consider VKA or DOAC agent for anticoagulation if the bleeding risk is acceptable.   You can also use the SmartPhrase .[email or web address] for documentation.   :[id number]} No BP recorded.  {Refresh Note OR Click here to enter BP  :1}***       Physical Exam:   VS:  There were no vitals taken for this visit.   Wt Readings from Last 3 Encounters:  03/26/23 190 lb (86.2 kg)  03/21/23 193 lb (87.5 kg)  02/04/23 188 lb (85.3 kg)    GEN: Well nourished, well developed in no acute  distress NECK: No JVD; No carotid bruits CARDIAC: ***RRR, no murmurs, rubs, gallops RESPIRATORY:  Clear to auscultation without rales, wheezing or rhonchi  ABDOMEN: Soft, non-tender, non-distended EXTREMITIES:  No edema; No deformity     ASSESSMENT AND PLAN: .   ***    {Are you ordering a CV Procedure (e.g. stress test, cath, DCCV, TEE, etc)?   Press F2        :[id number]}  Dispo: ***  Signed, Eligha Bridegroom, NP-C

## 2023-04-04 NOTE — Progress Notes (Signed)
Erik [name], Erik [name] ([id number]) [id number] Page 1 of 7 Visit Report for 04/03/2023 Debridement Details Patient Name: Date of Service: TO Erik [name]. 04/03/2023 1:15 PM Medical Record Number: [id number] Patient Account Number: [id number] Date of Birth/Sex: Treating RN: 1941-01-12 (82 y.o. M) Primary Care Provider: Guerry Bruin Other Clinician: Referring Provider: Treating Provider/Extender: Wyline Beady in Treatment: 6 Debridement Performed for Assessment: Wound #1 Right Calcaneus Performed By: Physician Maxwell Caul., MD The following information was scribed by: Karie Schwalbe The information was scribed for: [name] Debridement Type: Debridement Severity of Tissue Pre Debridement: Fat layer exposed Level of Consciousness (Pre-procedure): Awake and Alert Pre-procedure Verification/Time Out Yes - 13:40 Taken: Start Time: 13:40 Pain Control: Lidocaine 4% T opical Solution Percent of Wound Bed Debrided: 100% T Area Debrided (cm): otal 0.16 Tissue and other material debrided: Viable, Non-Viable, Slough, Subcutaneous, Slough Level: Skin/Subcutaneous Tissue Debridement Description: Excisional Instrument: Curette Bleeding: Minimum Hemostasis Achieved: Silver Nitrate Response to Treatment: Procedure was tolerated well Level of Consciousness (Post- Awake and Alert procedure): Post Debridement Measurements of Total Wound Length: (cm) 0.4 Width: (cm) 0.5 Depth: (cm) 0.3 Volume: (cm) 0.047 Character of Wound/Ulcer Post Debridement: Improved Severity of Tissue Post Debridement: Fat layer exposed Post Procedure Diagnosis Same as Pre-procedure Electronic Signature(s) Signed: 04/03/2023 4:34:18 PM By: [name] MD Entered By: [name] on 04/03/2023 13:56:55 -------------------------------------------------------------------------------- HPI Details Patient Name: Date of Service: TO Erik [name], Erik Fantasia W.  04/03/2023 1:15 PM Medical Record Number: [id number] Patient Account Number: [id number] Date of Birth/Sex: Treating RN: [birth year] (82 y.o. M) Primary Care Provider: Guerry Bruin Other Clinician: Referring Provider: Treating Provider/Extender: Wyline Beady in Treatment: 6 History of Present Illness Erik [name], Erik [name] ([id number]) [id number] Page 2 of 7 HPI Description: ADMISSION 02/18/2023 This is an 82 year old still very active man who was referred to [place] by Dr. Ardelle Anton at [facility]. He has a nonhealing wound on the plantar aspect of his right heel. He has been dealing with this since June when he presented to Dr. Loreta Ave with a painful area on the right mid heel. He was not really sure how this happened. He ultimately had an MRI in July that showed no osteomyelitis but there was a small abscess he was treated with antibiotics doxycycline and gradually the pain improved but he still been left with a small open area. He had an ABI on 11/14/2022 that was 1.08 on the right and 0.99 on the left. He by enlarge has been using Silvadene on the wound. Recently changed to silver alginate. He also developed a blister on the right anterior lower leg. Recent x-rays have not shown any cortical changes. Also in June he had a sedimentation rate of 2 and a C-reactive protein of 14 not suggestive of chronic active infection Past medical history includes type 2 diabetes, nonischemic cardiomyopathy, hypertension cholesterolemia, skin cancer, combined congestive heart failure, paroxysmal A-fib, idiopathic trigeminal neuralgia. 10/1; ulcer on the plantar aspect of his right heel. He is being compliant with the healing boot. We have been using Hydrofera Blue heel cup gauze. Dimensions are better 10/7; the area on the leg right anterior is healed. He has chronic insufficiency. Unfortunately the ulcer on the plantar aspect of his right heel looks about  the same each time with undermining the skin is not adherent to the underlying tissue. This requires debridement. We have been using Hydrofera Blue, heel cup and heel offloading shoe. We are not making progress with the heel  ulcer. 10/15; I brought the patient in today with the intention of putting him in a total contact cast if the wound on his plantar heel on the right was no better. However he comes in with erythema spreading towards the tip of his heel. There is also bogginess. There is no crepitus. He is a very active man. He is in the heel off loader but I do not think for 1 reason or another this is doing the job in terms of pressure and friction relief. I am concerned enough about the wound today not to put on a total contact cast 10/22; culture I did last week showed Staphylococcus epidermidis which is likely a skin contaminant but he also grew Staphylococcus capitis which was resistant to the doxycycline I put him on. I am going to give him a week of trimethoprim/sulfamethoxazole for this. I am not sure of the relevance of the coag negative staph however I do not want to put a cast and something that is infected. We have been using Iodoflex 10/29; patient with a right plantar heel wound. This was initially probably infectious with an MRI showing a small abscess but no osteomyelitis. 2 weeks ago he had erythema around the wound and bogginess but no crepitus I treated him with a prolonged course of antibiotics last week giving and will further week of Septra. We have been using mupirocin and silver alginate. The wound is measuring smaller today but there is undermining at 0.9 cm almost circumferentially 11/7; right plantar heel. This was not any smaller this week with considerable relative undermining almost circumferentially. This required debridement. I wanted to put this in a total contact cast but the patient was in a sling for a gene that we would not have been able to get off over the  cast. Electronic Signature(s) Signed: 04/03/2023 4:34:18 PM By: [name] MD Entered By: [name] on 04/03/2023 13:57:39 -------------------------------------------------------------------------------- Physical Exam Details Patient Name: Date of Service: TO Erik [name], Erik Fantasia W. 04/03/2023 1:15 PM Medical Record Number: [id number] Patient Account Number: [id number] Date of Birth/Sex: Treating RN: March 26, 1941 (82 y.o. M) Primary Care Provider: Guerry Bruin Other Clinician: Referring Provider: Treating Provider/Extender: Wyline Beady in Treatment: 6 Constitutional Patient is hypertensive.. Pulse regular and within target [name] for patient.[name] Respirations regular, non-labored and within target [name].. Temperature is normal and within the target [name] for the patient.[name] Appears in no distress. Notes Wound exam; punched out a right heel ulcer I thought this was a smaller last week although that may have been an error. T oday arrives with a lifeless wound surface but significant relative undermining. I used a #3 curette to remove overhanging skin debridement of the wound surface to clean things up. Hemostasis with a single silver nitrate. This does not probe deeply and there is no evidence of infection Electronic Signature(s) Signed: 04/03/2023 4:34:18 PM By: [name] MD Entered By: [name] on 04/03/2023 13:58:56 Erik [name] ([id number]) [phone].pdf Page 3 of 7 -------------------------------------------------------------------------------- Physician Orders Details Patient Name: Date of Service: TO Erik [name]. 04/03/2023 1:15 PM Medical Record Number: [id number] Patient Account Number: [id number] Date of Birth/Sex: Treating RN: [birth year] (82 y.o. Dianna Limbo Primary Care Provider: Guerry Bruin Other Clinician: Referring Provider: Treating Provider/Extender: Wyline Beady  in Treatment: 6 Verbal / Phone Orders: No Diagnosis Coding Follow-up Appointments ppointment in 1 week. - w/ Dr. Leanord Hawking Return A +++ Please wear Baggy legged long pants-prepare to have a T  Contact Cast placed on right leg. For next week's appointment. otal Other: - DONE-Culture taken- will call you once the results return.- RESULTS IN -MD notified (03/20/23) DONE If redness streaks up leg or passed the skin marking around the heel go to the emergency department. Anesthetic Wound #1 Right Calcaneus (In clinic) Topical Lidocaine 5% applied to wound bed Bathing/ Shower/ Hygiene May shower with protection but do not get wound dressing(s) wet. Protect dressing(s) with water repellant cover (for example, large plastic bag) or a cast cover and may then take shower. - purchase cast protector from [facility], [facility] or [employer] Off-Loading Total Contact Cast to Right Lower Extremity - Prepare for first TCC on next week wear loose leg pants - NOT YET NO TCC PLACED , then return for TCC change, after initial placement-NOT YET NO TCC PLACED , Wedge shoe to: - heel offloading shoe- wear while walking and standing. Mininize walking and standing. Wound Treatment Wound #1 - Calcaneus Wound Laterality: Right Cleanser: Soap and Water 1 x Per Week/30 Days Discharge Instructions: May shower and wash wound with dial antibacterial soap and water prior to dressing change. Cleanser: Vashe 5.8 (oz) 1 x Per Week/30 Days Discharge Instructions: Cleanse the wound with Vashe prior to applying a clean dressing using gauze sponges, not tissue or cotton balls. Peri-Wound Care: Triamcinolone 15 (g) 1 x Per Week/30 Days Discharge Instructions: Use triamcinolone 15 (g) as directed Peri-Wound Care: Sween Lotion (Moisturizing lotion) 1 x Per Week/30 Days Discharge Instructions: Apply moisturizing lotion as directed Topical: Mupirocin Ointment 1 x Per Week/30 Days Discharge Instructions: Apply Mupirocin (Bactroban) as  instructed Prim Dressing: Promogran Prisma Matrix, 4.34 (sq in) (silver collagen) ary 1 x Per Week/30 Days Discharge Instructions: Moisten collagen with hydrogel Secondary Dressing: ALLEVYN Heel 4 1/2in x 5 1/2in / 10.5cm x 13.5cm 1 x Per Week/30 Days Discharge Instructions: Apply over primary dressing as directed. Secondary Dressing: Woven Gauze Sponge, Non-Sterile 4x4 in 1 x Per Week/30 Days Discharge Instructions: Apply over primary dressing as directed. Secured With: Coban Self-Adherent Wrap 4x5 (in/yd) 1 x Per Week/30 Days Discharge Instructions: Secure with Coban as directed. Secured With: [employer], 4.5x3.1 (in/yd) 1 x Per Week/30 Days Discharge Instructions: Secure with Kerlix as directed. Compression Wrap: Unnaboot w/Calamine, 4x10 (in/yd) 1 x Per Week/30 Days Discharge Instructions: Apply to top of leg Electronic Signature(s) Signed: 04/03/2023 4:34:18 PM By: [name] MD Signed: 04/03/2023 6:28:11 PM By: Karie Schwalbe RN Entered By: Karie Schwalbe on 04/03/2023 13:52:17 Erik [name] ([id number]) [id number].pdf Page 4 of 7 -------------------------------------------------------------------------------- Problem List Details Patient Name: Date of Service: TO Erik [name]. 04/03/2023 1:15 PM Medical Record Number: [id number] Patient Account Number: [id number] Date of Birth/Sex: Treating RN: 12/11/1940 (82 y.o. M) Primary Care Provider: Guerry Bruin Other Clinician: Referring Provider: Treating Provider/Extender: Wyline Beady in Treatment: 6 Active Problems ICD-10 Encounter Code Description Active Date MDM Diagnosis E11.621 Type 2 diabetes mellitus with foot ulcer 02/18/2023 No Yes 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 02/18/2023 No Yes severity I87.331 Chronic venous hypertension (idiopathic) with ulcer and inflammation of right 02/18/2023 No Yes lower  extremity Inactive Problems ICD-10 Code Description Active Date Inactive Date L97.811 Non-pressure chronic ulcer of other part of right lower leg limited to breakdown of skin 02/18/2023 02/18/2023 Resolved Problems Electronic Signature(s) Signed: 04/03/2023 4:34:18 PM By: [name] MD Entered By: [name] on 04/03/2023 13:56:40 -------------------------------------------------------------------------------- Progress Note Details Patient Name: Date of Service: TO [facility]  Erik Frederic NK W. 04/03/2023 1:15 PM Medical Record Number: [id number] Patient Account Number: [id number] Date of Birth/Sex: Treating RN: 11/06/1940 (82 y.o. M) Primary Care Provider: Guerry Bruin Other Clinician: Referring Provider: Treating Provider/Extender: Wyline Beady in Treatment: 6 Subjective History of Present Illness (HPI) ADMISSION 02/18/2023 This is an 82 year old still very active man who was referred to [place] by Dr. Ardelle Anton at [facility]. He has a nonhealing wound on the plantar aspect of his right heel. He has been dealing with this since June when he presented to Dr. Loreta Ave with a painful area on the right mid heel. He was not really sure how this happened. He ultimately had an MRI in July that showed no osteomyelitis but there was a small abscess he was treated with antibiotics doxycycline and HUDSYN, SCHIAPPA ([id number]) [phone].pdf Page 5 of 7 gradually the pain improved but he still been left with a small open area. He had an ABI on 11/14/2022 that was 1.08 on the right and 0.99 on the left. He by enlarge has been using Silvadene on the wound. Recently changed to silver alginate. He also developed a blister on the right anterior lower leg. Recent x-rays have not shown any cortical changes. Also in June he had a sedimentation rate of 2 and a C-reactive protein of 14 not suggestive of chronic active infection Past medical history includes type  2 diabetes, nonischemic cardiomyopathy, hypertension cholesterolemia, skin cancer, combined congestive heart failure, paroxysmal A-fib, idiopathic trigeminal neuralgia. 10/1; ulcer on the plantar aspect of his right heel. He is being compliant with the healing boot. We have been using Hydrofera Blue heel cup gauze. Dimensions are better 10/7; the area on the leg right anterior is healed. He has chronic insufficiency. Unfortunately the ulcer on the plantar aspect of his right heel looks about the same each time with undermining the skin is not adherent to the underlying tissue. This requires debridement. We have been using Hydrofera Blue, heel cup and heel offloading shoe. We are not making progress with the heel ulcer. 10/15; I brought the patient in today with the intention of putting him in a total contact cast if the wound on his plantar heel on the right was no better. However he comes in with erythema spreading towards the tip of his heel. There is also bogginess. There is no crepitus. He is a very active man. He is in the heel off loader but I do not think for 1 reason or another this is doing the job in terms of pressure and friction relief. I am concerned enough about the wound today not to put on a total contact cast 10/22; culture I did last week showed Staphylococcus epidermidis which is likely a skin contaminant but he also grew Staphylococcus capitis which was resistant to the doxycycline I put him on. I am going to give him a week of trimethoprim/sulfamethoxazole for this. I am not sure of the relevance of the coag negative staph however I do not want to put a cast and something that is infected. We have been using Iodoflex 10/29; patient with a right plantar heel wound. This was initially probably infectious with an MRI showing a small abscess but no osteomyelitis. 2 weeks ago he had erythema around the wound and bogginess but no crepitus I treated him with a prolonged course of  antibiotics last week giving and will further week of Septra. We have been using mupirocin and silver alginate. The wound is measuring smaller  today but there is undermining at 0.9 cm almost circumferentially 11/7; right plantar heel. This was not any smaller this week with considerable relative undermining almost circumferentially. This required debridement. I wanted to put this in a total contact cast but the patient was in a sling for a gene that we would not have been able to get off over the cast. Objective Constitutional Patient is hypertensive.. Pulse regular and within target [name] for patient.[name] Respirations regular, non-labored and within target [name].. Temperature is normal and within the target [name] for the patient.[name] Appears in no distress. Vitals Time Taken: 12:45 PM, Height: 71 in, Weight: 193 lbs, BMI: 26.9, Temperature: 98.6 F, Pulse: 76 bpm, Respiratory Rate: 18 breaths/min, Blood Pressure: 158/69 mmHg, Capillary Blood Glucose: 94 mg/dl. General Notes: Wound exam; punched out a right heel ulcer I thought this was a smaller last week although that may have been an error. T oday arrives with a lifeless wound surface but significant relative undermining. I used a #3 curette to remove overhanging skin debridement of the wound surface to clean things up. Hemostasis with a single silver nitrate. This does not probe deeply and there is no evidence of infection Integumentary (Hair, Skin) Wound #1 status is Open. Original cause of wound was Gradually Appeared. The date acquired was: 01/21/2023. The wound has been in treatment 6 weeks. The wound is located on the Right Calcaneus. The wound measures 0.3cm length x 0.4cm width x 0.2cm depth; 0.094cm^2 area and 0.019cm^3 volume. There is Fat Layer (Subcutaneous Tissue) exposed. There is no tunneling or undermining noted. There is a medium amount of serosanguineous drainage noted. The wound margin is distinct with the outline attached to the wound  base. There is large (67-100%) red granulation within the wound bed. There is a small (1-33%) amount of necrotic tissue within the wound bed including Adherent Slough. The periwound skin appearance exhibited: Callus. The periwound skin appearance did not exhibit: Crepitus, Excoriation, Induration, Rash, Scarring, Dry/Scaly, Maceration, Atrophie Blanche, Cyanosis, Ecchymosis, Hemosiderin Staining, Mottled, Pallor, Rubor, Erythema. Periwound temperature was noted as No Abnormality. The periwound has tenderness on palpation. 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 There was a Excisional Skin/Subcutaneous Tissue Debridement with a total area of 0.16 sq cm performed by Maxwell Caul., MD. With the following instrument(s): Curette to remove Viable and Non-Viable tissue/material. Material removed includes Subcutaneous Tissue and Slough and after achieving pain control using Lidocaine 4% Topical Solution. No specimens were taken. A time out was conducted at 13:40, prior to the start of the procedure. A Minimum amount of bleeding was controlled with Silver Nitrate. The procedure was tolerated well. Post Debridement Measurements: 0.4cm length x 0.5cm width x 0.3cm depth; 0.047cm^3 volume. Erik [name], Erik [name] ([id number]) [id number] Page 6 of 7 Character of Wound/Ulcer Post Debridement is improved. Severity of Tissue Post Debridement is: Fat layer exposed. Post procedure Diagnosis Wound #1: Same as Pre-Procedure Plan Follow-up Appointments: Return Appointment in 1 week. - w/  Dr. Leanord Hawking +++ Please wear Baggy legged long pants-prepare to have a T Contact Cast placed on right leg. For next otal week's appointment. Other: - DONE-Culture taken- will call you once the results return.-  RESULTS IN -MD notified (03/20/23) DONE If redness streaks up leg or passed the skin marking around the heel go to the emergency department. Anesthetic: Wound #1 Right Calcaneus: (In clinic) Topical Lidocaine 5% applied to wound bed Bathing/ Shower/ Hygiene: May shower with protection but do not get wound dressing(s) wet. Protect dressing(s) with water repellant cover (for example, large plastic bag) or a cast cover and may then take shower. - purchase cast protector from [facility], [facility] or [employer] Off-Loading: T Contact Cast to Right Lower Extremity - Prepare for first TCC on next week wear loose leg pants - NOT YET NO TCC PLACED then return for TCC otal , change, after initial placement-NOT YET NO TCC PLACED , Wedge shoe to: - heel offloading shoe- wear while walking and standing. Mininize walking and standing. WOUND #1: - Calcaneus Wound Laterality: Right Cleanser: Soap and Water 1 x Per Week/30 Days Discharge Instructions: May shower and wash wound with dial antibacterial soap and water prior to dressing change. Cleanser: Vashe 5.8 (oz) 1 x Per Week/30 Days Discharge Instructions: Cleanse the wound with Vashe prior to applying a clean dressing using gauze sponges, not tissue or cotton balls. Peri-Wound Care: Triamcinolone 15 (g) 1 x Per Week/30 Days Discharge Instructions: Use triamcinolone 15 (g) as directed Peri-Wound Care: Sween Lotion (Moisturizing lotion) 1 x Per Week/30 Days Discharge Instructions: Apply moisturizing lotion as directed Topical: Mupirocin Ointment 1 x Per Week/30 Days Discharge Instructions: Apply Mupirocin (Bactroban) as instructed Prim Dressing: Promogran Prisma Matrix, 4.34 (sq in) (silver collagen) 1 x Per Week/30 Days ary Discharge Instructions: Moisten  collagen with hydrogel Secondary Dressing: ALLEVYN Heel 4 1/2in x 5 1/2in / 10.5cm x 13.5cm 1 x Per Week/30 Days Discharge Instructions: Apply over primary dressing as directed. Secondary Dressing: Woven Gauze Sponge, Non-Sterile 4x4 in 1 x Per Week/30 Days Discharge Instructions: Apply over primary dressing as directed. Secured With: Coban Self-Adherent Wrap 4x5 (in/yd) 1 x Per Week/30 Days Discharge Instructions: Secure with Coban as directed. Secured With: [employer], 4.5x3.1 (in/yd) 1 x Per Week/30 Days Discharge Instructions: Secure with Kerlix as directed. Com pression Wrap: Unnaboot w/Calamine, 4x10 (in/yd) 1 x Per Week/30 Days Discharge Instructions: Apply to top of leg 1. I was going to put him in a total contact cast today but he had a slim fit gene that he would not have been able to get off over the cast 2. Changed from silver alginate to silver collagen hydrogel. 3. Aim for a total contact cast for sure next week Electronic Signature(s) Signed: 04/03/2023 4:34:18 PM By: [name] MD Entered By: [name] on 04/03/2023 13:59:34 -------------------------------------------------------------------------------- SuperBill Details Patient Name: Date of Service: [name] 04/03/2023 Medical Record Number: [id number] Patient Account Number: [id number] Date of Birth/Sex: Treating RN: February 02, 1941 (82 y.o. 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 I87.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 Facility  Procedures : CPT4 Code: [id number] Description: 11042 - DEB SUBQ TISSUE 20 SQ CM/< ICD-10 Diagnosis Description E11.621 Type 2 diabetes mellitus with foot ulcer 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 seve Modifier: rity Quantity: 1 Physician Procedures : CPT4 Code Description Modifier  [id number] 11042 - WC PHYS SUBQ TISS 20 SQ CM ICD-10 Diagnosis Description E11.621 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

## 2023-04-04 NOTE — Progress Notes (Signed)
DELORIAN, SABIA ([id number]) [id number] Page 1 of 7 Visit Report for 04/03/2023 Arrival Information Details Patient Name: Date of Service: TO Erik [name]. 04/03/2023 1:15 PM Medical Record Number: [id number] Patient Account Number: [id number] Date of Birth/Sex: Treating RN: [birth year] (82 y.o. Erik [name], [name] Primary Care Halah Whiteside: Guerry Bruin Other Clinician: Referring Trinity Hyland: Treating Lezette Kitts/Extender: Wyline Beady in Treatment: 6 Visit Information History Since Last Visit Added or deleted any medications: No Patient Arrived: Dan Humphreys Any new allergies or adverse reactions: No Arrival Time: 12:54 Had a fall or experienced change in No Accompanied By: wife activities of daily living that may affect Transfer Assistance: None risk of falls: Patient Identification Verified: Yes Signs or symptoms of abuse/neglect since last visito No Secondary Verification Process Completed: Yes Hospitalized since last visit: No Patient Requires Transmission-Based Precautions: No Implantable device outside of the clinic excluding No Patient Has Alerts: Yes cellular tissue based products placed in the center Patient Alerts: ABI: R 1.08 11/14/22 since last visit: Has Dressing in Place as Prescribed: Yes Has Compression in Place as Prescribed: Yes Pain Present Now: No Notes compression wrap slid down. Electronic Signature(s) Signed: 04/03/2023 6:10:25 PM By: Shawn Stall RN, BSN Entered By: Shawn Stall on 04/03/2023 13:10:14 -------------------------------------------------------------------------------- Encounter Discharge Information Details Patient Name: Date of Service: TO Erik [name], Erik Presto NK W. 04/03/2023 1:15 PM Medical Record Number: [id number] Patient Account Number: [id number] Date of Birth/Sex: Treating RN: 1940/07/07 (82 y.o. Erik [name] Primary Care Alver Leete: Guerry Bruin Other Clinician: Referring Janaisa Birkland: Treating  Mollye Guinta/Extender: Wyline Beady in Treatment: 6 Encounter Discharge Information Items Post Procedure Vitals Discharge Condition: Stable Temperature (F): 98.6 Ambulatory Status: Ambulatory Pulse (bpm): 76 Discharge Destination: Home Respiratory Rate (breaths/min): 18 Transportation: Private Auto Blood Pressure (mmHg): 158/69 Accompanied By: wife Schedule Follow-up Appointment: Yes Clinical Summary of Care: Electronic Signature(s) Signed: 04/03/2023 6:10:25 PM By: Shawn Stall RN, BSN Entered By: Shawn Stall on 04/03/2023 15:32:54 Erik [name] ([id number]) [id number] Page 2 of 7 -------------------------------------------------------------------------------- Lower Extremity Assessment Details Patient Name: Date of Service: TO Erik [name]. 04/03/2023 1:15 PM Medical Record Number: [id number] Patient Account Number: [id number] Date of Birth/Sex: Treating RN: August 04, 1940 (82 y.o. Erik [name] Primary Care Candy Leverett: Guerry Bruin Other Clinician: Referring Aleka Twitty: Treating Astrid Vides/Extender: Wyline Beady in Treatment: 6 Edema Assessment Assessed: Erik [name]: No] [name]: Yes] Edema: [Left: Ye] [Right: s] Calf Left: Right: Point of Measurement: From Medial Instep 42 cm Ankle Left: Right: Point of Measurement: From Medial Instep 23 cm Vascular Assessment Pulses: Dorsalis Pedis Palpable: [Right:Yes] Extremity colors, hair growth, and conditions: Extremity Color: [Right:Normal] Hair Growth on Extremity: [Right:No] Temperature of Extremity: [Right:Warm] Capillary Refill: [Right:< 3 seconds] Dependent Rubor: [Right:No] Blanched when Elevated: [Right:No Yes] Toe Nail Assessment Left: Right: Thick: No Discolored: No Deformed: No Improper Length and Hygiene: No Electronic Signature(s) Signed: 04/03/2023 6:10:25 PM By: Shawn Stall RN, BSN Entered By: Shawn Stall on 04/03/2023  13:10:56 -------------------------------------------------------------------------------- Multi Wound Chart Details Patient Name: Date of Service: TO Erik [name], [name] 04/03/2023 1:15 PM Medical Record Number: [id number] Patient Account Number: [id number] Date of Birth/Sex: Treating RN: 1941-04-10 (82 y.o. M) Primary Care Azizah Lisle: Guerry Bruin Other Clinician: Referring Lilybelle Mayeda: Treating Lucrezia Dehne/Extender: Wyline Beady in Treatment: 6 Vital Signs Height(in): 71 Capillary Blood Glucose(mg/dl): 94 Weight(lbs): 601 Pulse(bpm): [address] ([id number]) [id number] Page 3 of 7 Body Mass Index(BMI): 26.9 Blood Pressure(mmHg): 158/69 Temperature(F): 98.6 Respiratory Rate(breaths/min): 18 [1:Photos:] [N/A:N/A] Right Calcaneus  N/A N/A Wound Location: Gradually Appeared N/A N/A Wounding Event: Diabetic Wound/Ulcer of the Lower N/A N/A Primary Etiology: Extremity Anemia, Arrhythmia, Coronary Artery N/A N/A Comorbid History: Disease, Hypertension, Peripheral Arterial Disease, Type II Diabetes, Neuropathy 01/21/2023 N/A N/A Date Acquired: 6 N/A N/A Weeks of Treatment: Open N/A N/A Wound Status: No N/A N/A Wound Recurrence: 0.3x0.4x0.2 N/A N/A Measurements L x W x D (cm) 0.094 N/A N/A A (cm) : rea 0.019 N/A N/A Volume (cm) : 33.30% N/A N/A % Reduction in A rea: 32.10% N/A N/A % Reduction in Volume: Grade 2 N/A N/A Classification: Medium N/A N/A Exudate A mount: Serosanguineous N/A N/A Exudate Type: red, brown N/A N/A Exudate Color: Distinct, outline attached N/A N/A Wound Margin: Large (67-100%) N/A N/A Granulation A mount: Red N/A N/A Granulation Quality: Small (1-33%) N/A N/A Necrotic A mount: Fat Layer (Subcutaneous Tissue): Yes N/A N/A Exposed Structures: Fascia: No Tendon: No Muscle: No Joint: No Bone: No Medium (34-66%) N/A N/A Epithelialization: Debridement - Excisional N/A  N/A Debridement: Pre-procedure Verification/Time Out 13:40 N/A N/A Taken: Lidocaine 4% T opical Solution N/A N/A Pain Control: Subcutaneous, Slough N/A N/A Tissue Debrided: Skin/Subcutaneous Tissue N/A N/A Level: 0.16 N/A N/A Debridement A (sq cm): rea Curette N/A N/A Instrument: Minimum N/A N/A Bleeding: Silver Nitrate N/A N/A Hemostasis A chieved: Procedure was tolerated well N/A N/A Debridement Treatment Response: 0.4x0.5x0.3 N/A N/A Post Debridement Measurements L x W x D (cm) 0.047 N/A N/A Post Debridement Volume: (cm) Callus: Yes N/A N/A Periwound Skin Texture: Excoriation: No Induration: No Crepitus: No Rash: No Scarring: No Maceration: No N/A N/A Periwound Skin Moisture: Dry/Scaly: No Atrophie Blanche: No N/A N/A Periwound Skin Color: Cyanosis: No Ecchymosis: No Erythema: No Hemosiderin Staining: No Mottled: No Pallor: No Rubor: No No Abnormality N/A N/A Temperature: Yes N/A N/A Tenderness on Palpation: Debridement N/A N/A Procedures Performed: Treatment Notes YOSUKE, GITTENS ([id number]) [phone].pdf Page 4 of 7 Electronic Signature(s) Signed: 04/03/2023 4:34:18 PM By: [name] MD Entered By: [name] on 04/03/2023 13:56:47 -------------------------------------------------------------------------------- Multi-Disciplinary Care Plan Details Patient Name: Date of Service: TO Erik [name], Erik Presto NK W. 04/03/2023 1:15 PM Medical Record Number: [id number] Patient Account Number: [id number] Date of Birth/Sex: Treating RN: Aug 03, 1940 (82 y.o. Erik [name] Primary Care Buckley Bradly: Guerry Bruin Other Clinician: Referring Eevee Borbon: Treating Larena Ohnemus/Extender: Wyline Beady in Treatment: 6 Active Inactive Wound/Skin Impairment Nursing Diagnoses: Impaired tissue integrity Knowledge deficit related to ulceration/compromised skin integrity Goals: Patient/caregiver will verbalize  understanding of skin care regimen Date Initiated: 02/18/2023 Target Resolution Date: 05/04/2023 Goal Status: Active Ulcer/skin breakdown will have a volume reduction of 30% by week 4 Date Initiated: 02/18/2023 Target Resolution Date: 05/26/2023 Goal Status: Active Interventions: Assess patient/caregiver ability to obtain necessary supplies Assess patient/caregiver ability to perform ulcer/skin care regimen upon admission and as needed Assess ulceration(s) every visit Provide education on ulcer and skin care Notes: Electronic Signature(s) Signed: 04/03/2023 6:28:11 PM By: Karie Schwalbe RN Entered By: Karie Schwalbe on 04/03/2023 18:17:07 -------------------------------------------------------------------------------- Pain Assessment Details Patient Name: Date of Service: [name] 04/03/2023 1:15 PM Medical Record Number: [id number] Patient Account Number: [id number] Date of Birth/Sex: Treating RN: 10-15-1940 (82 y.o. Erik [name] Primary Care Lorette Peterkin: Guerry Bruin Other Clinician: Referring Tyhesha Dutson: Treating Lakina Mcintire/Extender: Wyline Beady in Treatment: 6 Active Problems Location of Pain Severity and Description of Pain Patient Has Paino No Site Locations Erik, [name] ([id number]) [phone].pdf Page 5 of 7 Pain Management and Medication Current Pain Management: Electronic  Signature(s) Signed: 04/03/2023 6:10:25 PM By: Shawn Stall RN, BSN Entered By: Shawn Stall on 04/03/2023 13:10:35 -------------------------------------------------------------------------------- Patient/Caregiver Education Details Patient Name: Date of Service: TO Erik [name] 11/7/2024andnbsp1:15 PM Medical Record Number: [id number] Patient Account Number: [id number] Date of Birth/Gender: Treating RN: Aug 24, 1940 (82 y.o. Erik [name] Primary Care Physician: Guerry Bruin Other Clinician: Referring Physician: Treating  Physician/Extender: Wyline Beady in Treatment: 6 Education Assessment Education Provided To: Patient Education Topics Provided Wound/Skin Impairment: Methods: Demonstration Responses: State content correctly Electronic Signature(s) Signed: 04/03/2023 6:28:11 PM By: Karie Schwalbe RN Entered By: Karie Schwalbe on 04/03/2023 18:17:20 -------------------------------------------------------------------------------- Wound Assessment Details Patient Name: Date of Service: [name] 04/03/2023 1:15 PM Medical Record Number: [id number] Patient Account Number: [id number] Date of Birth/Sex: Treating RN: 02-Feb-1941 (82 y.o. Erik [name] Primary Care Juandedios Dudash: Guerry Bruin Other Clinician: Referring Kasia Trego: Treating Maizy Davanzo/Extender: Erik [name], Erik [name] ([id number]) [id number] Page 6 of 7 Weeks in Treatment: 6 Wound Status Wound Number: 1 Primary Diabetic Wound/Ulcer of the Lower Extremity Etiology: Wound Location: Right Calcaneus Wound Open Wounding Event: Gradually Appeared Status: Date Acquired: 01/21/2023 Comorbid Anemia, Arrhythmia, Coronary Artery Disease, Hypertension, Weeks Of Treatment: 6 History: Peripheral Arterial Disease, Type II Diabetes, Neuropathy Clustered Wound: No Photos Wound Measurements Length: (cm) 0.3 Width: (cm) 0.4 Depth: (cm) 0.2 Area: (cm) 0.094 Volume: (cm) 0.019 % Reduction in Area: 33.3% % Reduction in Volume: 32.1% Epithelialization: Medium (34-66%) Tunneling: No Undermining: No Wound Description Classification: Grade 2 Wound Margin: Distinct, outline attached Exudate Amount: Medium Exudate Type: Serosanguineous Exudate Color: red, brown Foul Odor After Cleansing: No Slough/Fibrino Yes Wound Bed Granulation Amount: Large (67-100%) Exposed Structure Granulation Quality: Red Fascia Exposed: No Necrotic Amount: Small (1-33%) Fat Layer  (Subcutaneous Tissue) Exposed: Yes Necrotic Quality: Adherent Slough Tendon Exposed: No Muscle Exposed: No Joint Exposed: No Bone Exposed: No Periwound Skin Texture Texture Color No Abnormalities Noted: No No Abnormalities Noted: No Callus: Yes Atrophie Blanche: No Crepitus: No Cyanosis: No Excoriation: No Ecchymosis: No Induration: No Erythema: No Rash: No Hemosiderin Staining: No Scarring: No Mottled: No Pallor: No Moisture Rubor: No No Abnormalities Noted: No Dry / Scaly: No Temperature / Pain Maceration: No Temperature: No Abnormality Tenderness on Palpation: Yes Treatment Notes Wound #1 (Calcaneus) Wound Laterality: Right Cleanser Soap and Water Discharge Instruction: May shower and wash wound with dial antibacterial soap and water prior to dressing change. Vashe 5.8 (oz) Discharge Instruction: Cleanse the wound with Vashe prior to applying a clean dressing using gauze sponges, not tissue or cotton balls. Peri-Wound Care Erik, [name] ([id number]) [phone].pdf Page 7 of 7 Triamcinolone 15 (g) Discharge Instruction: Use triamcinolone 15 (g) as directed Sween Lotion (Moisturizing lotion) Discharge Instruction: Apply moisturizing lotion as directed Topical Mupirocin Ointment Discharge Instruction: Apply Mupirocin (Bactroban) as instructed Primary Dressing Promogran Prisma Matrix, 4.34 (sq in) (silver collagen) Discharge Instruction: Moisten collagen with hydrogel Secondary Dressing ALLEVYN Heel 4 1/2in x 5 1/2in / 10.5cm x 13.5cm Discharge Instruction: Apply over primary dressing as directed. Woven Gauze Sponge, Non-Sterile 4x4 in Discharge Instruction: Apply over primary dressing as directed. Secured With [employer] 4x5 (in/yd) Discharge Instruction: Secure with Coban as directed. Kerlix Roll Sterile, 4.5x3.1 (in/yd) Discharge Instruction: Secure with Kerlix as directed. Compression Wrap Unnaboot w/Calamine, 4x10  (in/yd) Discharge Instruction: Apply to top of leg Compression Stockings Add-Ons Electronic Signature(s) Signed: 04/03/2023 6:10:25 PM By: Shawn Stall RN, BSN Entered By: Shawn Stall on 04/03/2023 13:12:51 -------------------------------------------------------------------------------- Vitals Details Patient  Name: Date of Service: TO Erik [name]. 04/03/2023 1:15 PM Medical Record Number: [id number] Patient Account Number: [id number] Date of Birth/Sex: Treating RN: 1941/04/05 (82 y.o. Erik [name] Primary Care Asja Frommer: Guerry Bruin Other Clinician: Referring Vasili Fok: Treating Tovia Kisner/Extender: Wyline Beady in Treatment: 6 Vital Signs Time Taken: 12:45 Temperature (F): 98.6 Height (in): 71 Pulse (bpm): 76 Weight (lbs): 193 Respiratory Rate (breaths/min): 18 Body Mass Index (BMI): 26.9 Blood Pressure (mmHg): 158/69 Capillary Blood Glucose (mg/dl): 94 Reference [name]: 80 - 120 mg / dl Electronic Signature(s) Signed: 04/03/2023 6:10:25 PM By: Shawn Stall RN, BSN Entered By: Shawn Stall on 04/03/2023 13:10:38

## 2023-04-08 ENCOUNTER — Encounter: Payer: Self-pay | Admitting: Nurse Practitioner

## 2023-04-08 ENCOUNTER — Telehealth: Payer: Self-pay | Admitting: *Deleted

## 2023-04-08 ENCOUNTER — Ambulatory Visit: Payer: PPO | Attending: Nurse Practitioner | Admitting: Nurse Practitioner

## 2023-04-08 VITALS — BP 138/62 | HR 71 | Ht 71.0 in | Wt 190.0 lb

## 2023-04-08 DIAGNOSIS — I5042 Chronic combined systolic (congestive) and diastolic (congestive) heart failure: Secondary | ICD-10-CM | POA: Diagnosis not present

## 2023-04-08 DIAGNOSIS — D6869 Other thrombophilia: Secondary | ICD-10-CM | POA: Diagnosis not present

## 2023-04-08 DIAGNOSIS — Z79899 Other long term (current) drug therapy: Secondary | ICD-10-CM

## 2023-04-08 DIAGNOSIS — I5043 Acute on chronic combined systolic (congestive) and diastolic (congestive) heart failure: Secondary | ICD-10-CM

## 2023-04-08 DIAGNOSIS — N183 Chronic kidney disease, stage 3 unspecified: Secondary | ICD-10-CM

## 2023-04-08 DIAGNOSIS — I10 Essential (primary) hypertension: Secondary | ICD-10-CM | POA: Diagnosis not present

## 2023-04-08 DIAGNOSIS — I2510 Atherosclerotic heart disease of native coronary artery without angina pectoris: Secondary | ICD-10-CM

## 2023-04-08 DIAGNOSIS — I428 Other cardiomyopathies: Secondary | ICD-10-CM

## 2023-04-08 DIAGNOSIS — I480 Paroxysmal atrial fibrillation: Secondary | ICD-10-CM

## 2023-04-08 DIAGNOSIS — I5022 Chronic systolic (congestive) heart failure: Secondary | ICD-10-CM

## 2023-04-08 MED ORDER — METOPROLOL SUCCINATE ER 50 MG PO TB24
50.0000 mg | ORAL_TABLET | Freq: Every day | ORAL | Status: DC
Start: 2023-04-08 — End: 2024-03-16

## 2023-04-08 MED ORDER — RIVAROXABAN 15 MG PO TABS: 15.0000 mg | ORAL_TABLET | Freq: Every day | ORAL | 3 refills | Status: DC

## 2023-04-08 NOTE — Telephone Encounter (Signed)
S/w pt per Marcelino Duster. Pt did not pick up xarelto the pharmacy does not have it ready.  Pharmacy will call pt with price and when script is ready. If to expensive pt will refuse. Will call back pt either this pm or tomorrow am to see what pt decided. Went through [employer] and [facility].  Pt will not take Lopressor and on take one (1) tablet by mouth ( 50 mg) daily. Had pt write on both bottles of ( 50 mg) tablets NOT to take one and one half tablets as previous written on bottle. Medication list updated. Advised with Marcelino Duster.

## 2023-04-08 NOTE — Patient Instructions (Signed)
Medication Instructions:   Restart xarelto ( 15 mg) daily with supper.   *If you need a refill on your cardiac medications before your next appointment, please call your pharmacy*   Lab Work:  None ordered.  If you have labs (blood work) drawn today and your tests are completely normal, you will receive your results only by: MyChart Message (if you have MyChart) OR A paper copy in the mail If you have any lab test that is abnormal or we need to change your treatment, we will call you to review the results.   Testing/Procedures:  None ordered.   Follow-Up: At [facility], you and your health needs are our priority.  As part of our continuing mission to provide you with exceptional heart care, we have created designated Provider Care Teams.  These Care Teams include your primary Cardiologist (physician) and Advanced Practice Providers (APPs -  Physician Assistants and Nurse Practitioners) who all work together to provide you with the care you need, when you need it.  We recommend signing up for the patient portal called "MyChart".  Sign up information is provided on this After Visit Summary.  MyChart is used to connect with patients for Virtual Visits (Telemedicine).  Patients are able to view lab/test results, encounter notes, upcoming appointments, etc.  Non-urgent messages can be sent to your provider as well.   To learn more about what you can do with MyChart, go to [email or web address].    Your next appointment:   6 month(s)  Provider:   Dr. Jerene Pitch     Other Instructions  I will call you this pm to ask if you are taking Metoprolol and if you can afford xarelto.

## 2023-04-09 ENCOUNTER — Telehealth (HOSPITAL_BASED_OUTPATIENT_CLINIC_OR_DEPARTMENT_OTHER): Payer: Self-pay | Admitting: *Deleted

## 2023-04-09 NOTE — Telephone Encounter (Signed)
S/w pt has not found out about xarelto.  Pharmacy has not called pt.  Pt will try and call today.  Will call back later.

## 2023-04-09 NOTE — Telephone Encounter (Signed)
-----   Message from [facility] Jasamine Pottinger G sent at 04/08/2023 11:47 AM EST ----- Call pt about xarelto.

## 2023-04-09 NOTE — Telephone Encounter (Signed)
Lvm will call pt back later to check if pt picked up xarelto or not due to price.

## 2023-04-10 NOTE — Telephone Encounter (Signed)
He could use the Xarelto with me program until the end of the year- this would be ~$89/month. Depending on his plan next year, cost would decrease with the max out of pocket for the year being $2000.  Don't think he would be a great pradaxa candidate. Warfarin another option, but would need to come for INR checks.

## 2023-04-10 NOTE — Telephone Encounter (Signed)
Patient is returning phone call.  °

## 2023-04-10 NOTE — Telephone Encounter (Signed)
-----   Message from [facility] Jasamine Pottinger G sent at 04/08/2023 11:47 AM EST ----- Call pt about xarelto.

## 2023-04-10 NOTE — Telephone Encounter (Signed)
Pt called in to stated cannot afford xarelto, $ 333 dollars monthly. Stated has been off medication about a year and last time pt was on xarelto he did not have to pay anything.  Pt was working with a group called [employer]. Stated the pharmacist that worked for the company is no longer employed there. Will send to [facility] and [place], Pharm-D to review. Please advise.

## 2023-04-10 NOTE — Telephone Encounter (Signed)
Wanted to add pt's spouses phone number [phone]. Pt's phone does not work all the time, both numbers.

## 2023-04-11 ENCOUNTER — Encounter (HOSPITAL_BASED_OUTPATIENT_CLINIC_OR_DEPARTMENT_OTHER): Payer: PPO | Admitting: General Surgery

## 2023-04-11 DIAGNOSIS — E11621 Type 2 diabetes mellitus with foot ulcer: Secondary | ICD-10-CM | POA: Diagnosis not present

## 2023-04-11 NOTE — Progress Notes (Signed)
NGOC, MALLA ([id number]) [id number] Page 1 of 5 Visit Report for 04/11/2023 Arrival Information Details Patient Name: Date of Service: TO [name] 04/11/2023 11:15 A M Medical Record Number: [id number] Patient Account Number: [id number] Date of Birth/Sex: Treating RN: 1940/07/14 (82 y.o. M) Primary Care Keneth Borg: Guerry Bruin Other Clinician: Referring Kenson Groh: Treating Arieonna Medine/Extender: [name] in Treatment: 7 Visit Information History Since Last Visit Added or deleted any medications: No Patient Arrived: Ambulatory Any new allergies or adverse reactions: No Arrival Time: 11:32 Had a fall or experienced change in No Accompanied By: wife activities of daily living that may affect Transfer Assistance: None risk of falls: Patient Identification Verified: Yes Signs or symptoms of abuse/neglect since last visito No Secondary Verification Process Completed: Yes Hospitalized since last visit: No Patient Requires Transmission-Based Precautions: No Implantable device outside of the clinic excluding No Patient Has Alerts: Yes cellular tissue based products placed in the center Patient Alerts: ABI: R 1.08 11/14/22 since last visit: Has Dressing in Place as Prescribed: Yes Has Compression in Place as Prescribed: Yes Pain Present Now: No Electronic Signature(s) Signed: 04/11/2023 12:18:19 PM By: Thayer Dallas Entered By: Thayer Dallas on 04/11/2023 08:55:35 -------------------------------------------------------------------------------- Clinic Level of Care Assessment Details Patient Name: Date of Service: TO [name] 04/11/2023 11:15 A M Medical Record Number: [id number] Patient Account Number: [id number] Date of Birth/Sex: Treating RN: [birth year] (82 y.o. M) Primary Care Macy Polio: Guerry Bruin Other Clinician: Thayer Dallas Referring Larrisa Cravey: Treating Tiandra Swoveland/Extender: [name] in Treatment: 7 Clinic Level of Care Assessment Items TOOL 4 Quantity Score X- 1 0 Use when only an EandM is performed on FOLLOW-UP visit ASSESSMENTS - Nursing Assessment / Reassessment X- 1 10 Reassessment of Co-morbidities (includes updates in patient status) X- 1 5 Reassessment of Adherence to Treatment Plan ASSESSMENTS - Wound and Skin A ssessment / Reassessment X - Simple Wound Assessment / Reassessment - one wound 1 5 []  - 0 Complex Wound Assessment / Reassessment - multiple wounds []  - 0 Dermatologic / Skin Assessment (not related to wound area) ASSESSMENTS - Focused Assessment []  - 0 Circumferential Edema Measurements - multi extremities []  - 0 Nutritional Assessment / Counseling / Intervention DRAVYN, REDWOOD ([id number]) [id number] Page 2 of 5 []  - 0 Lower Extremity Assessment (monofilament, tuning fork, pulses) []  - 0 Peripheral Arterial Disease Assessment (using hand held doppler) ASSESSMENTS - Ostomy and/or Continence Assessment and Care []  - 0 Incontinence Assessment and Management []  - 0 Ostomy Care Assessment and Management (repouching, etc.) PROCESS - Coordination of Care X - Simple Patient / Family Education for ongoing care 1 15 []  - 0 Complex (extensive) Patient / Family Education for ongoing care []  - 0 Staff obtains Chiropractor, Records, T Results / Process Orders est []  - 0 Staff telephones HHA, Nursing Homes / Clarify orders / etc []  - 0 Routine Transfer to another Facility (non-emergent condition) []  - 0 Routine Hospital Admission (non-emergent condition) []  - 0 New Admissions / Manufacturing engineer / Ordering NPWT Apligraf, etc. , []  - 0 Emergency Hospital Admission (emergent condition) []  - 0 Simple Discharge Coordination []  - 0 Complex (extensive) Discharge Coordination PROCESS - Special Needs []  - 0 Pediatric / Minor Patient Management []  - 0 Isolation Patient  Management []  - 0 Hearing / Language / Visual special needs []  - 0 Assessment of Community assistance (transportation, D/C planning, etc.) []  - 0 Additional assistance / Altered mentation []  - 0 Support Surface(s) Assessment (  bed, cushion, seat, etc.) INTERVENTIONS - Wound Cleansing / Measurement X - Simple Wound Cleansing - one wound 1 5 []  - 0 Complex Wound Cleansing - multiple wounds []  - 0 Wound Imaging (photographs - any number of wounds) []  - 0 Wound Tracing (instead of photographs) []  - 0 Simple Wound Measurement - one wound []  - 0 Complex Wound Measurement - multiple wounds INTERVENTIONS - Wound Dressings X - Small Wound Dressing one or multiple wounds 1 10 []  - 0 Medium Wound Dressing one or multiple wounds []  - 0 Large Wound Dressing one or multiple wounds []  - 0 Application of Medications - topical []  - 0 Application of Medications - injection INTERVENTIONS - Miscellaneous []  - 0 External ear exam []  - 0 Specimen Collection (cultures, biopsies, blood, body fluids, etc.) []  - 0 Specimen(s) / Culture(s) sent or taken to Lab for analysis []  - 0 Patient Transfer (multiple staff / Nurse, adult / Similar devices) []  - 0 Simple Staple / Suture removal (25 or less) []  - 0 Complex Staple / Suture removal (26 or more) []  - 0 Hypo / Hyperglycemic Management (close monitor of Blood Glucose) [name] ([id number]) [id number] Page 3 of 5 []  - 0 Ankle / Brachial Index (ABI) - do not check if billed separately []  - 0 Vital Signs Has the patient been seen at the hospital within the last three years: Yes Total Score: 50 Level Of Care: New/Established - Level 2 Electronic Signature(s) Signed: 04/11/2023 12:18:19 PM By: Thayer Dallas Entered By: Thayer Dallas on 04/11/2023 09:12:30 -------------------------------------------------------------------------------- Encounter Discharge Information Details Patient Name: Date of Service: TO  [name] 04/11/2023 11:15 A M Medical Record Number: [id number] Patient Account Number: [id number] Date of Birth/Sex: Treating RN: [birth year] (82 y.o. M) Primary Care Lyzbeth Genrich: Guerry Bruin Other Clinician: Thayer Dallas Referring Elmarie Devlin: Treating Danine Hor/Extender: [name] in Treatment: 7 Encounter Discharge Information Items Discharge Condition: Stable Ambulatory Status: Ambulatory Discharge Destination: Home Transportation: Other Accompanied By: self Schedule Follow-up Appointment: Yes Clinical Summary of Care: Electronic Signature(s) Signed: 04/11/2023 12:18:19 PM By: Thayer Dallas Entered By: Thayer Dallas on 04/11/2023 09:13:11 -------------------------------------------------------------------------------- Patient/Caregiver Education Details Patient Name: Date of Service: TO [name] 11/15/2024andnbsp11:15 A M Medical Record Number: [id number] Patient Account Number: [id number] Date of Birth/Gender: Treating RN: 1941-03-18 (82 y.o. M) Primary Care Physician: Guerry Bruin Other Clinician: Thayer Dallas Referring Physician: Treating Physician/Extender: [name] in Treatment: 7 Education Assessment Education Provided To: Patient Education Topics Provided Electronic Signature(s) Signed: 04/11/2023 12:18:19 PM By: Thayer Dallas Entered By: Thayer Dallas on 04/11/2023 09:12:53 [name] ([id number]) [id number] Page 4 of 5 -------------------------------------------------------------------------------- Wound Assessment Details Patient Name: Date of Service: TO [name] 04/11/2023 11:15 A M Medical Record Number: [id number] Patient Account Number: [id number] Date of Birth/Sex: Treating RN: 05-17-1941 (82 y.o. M) Primary Care Tranise Forrest: Guerry Bruin Other Clinician: Referring Rockelle Heuerman: Treating Sayge Salvato/Extender: [name] in Treatment: 7 Wound Status Wound Number: 1 Primary Etiology: Diabetic Wound/Ulcer of the Lower Extremity Wound Location: Right Calcaneus Wound Status: Open Wounding Event: Gradually Appeared Date Acquired: 01/21/2023 Weeks Of Treatment: 7 Clustered Wound: No Wound Measurements Length: (cm) 0.3 Width: (cm) 0.4 Depth: (cm) 0.2 Area: (cm) 0.094 Volume: (cm) 0.019 % Reduction in Area: 33.3% % Reduction in Volume: 32.1% Wound Description Classification: Grade 2 Exudate Amount: Medium Exudate Type: Serosanguineous Exudate Color: red, brown Periwound Skin Texture Texture Color No Abnormalities Noted: No No Abnormalities  Noted: No Moisture No Abnormalities Noted: No Treatment Notes Wound #1 (Calcaneus) Wound Laterality: Right Cleanser Soap and Water Discharge Instruction: May shower and wash wound with dial antibacterial soap and water prior to dressing change. Vashe 5.8 (oz) Discharge Instruction: Cleanse the wound with Vashe prior to applying a clean dressing using gauze sponges, not tissue or cotton balls. Peri-Wound Care Triamcinolone 15 (g) Discharge Instruction: Use triamcinolone 15 (g) as directed Sween Lotion (Moisturizing lotion) Discharge Instruction: Apply moisturizing lotion as directed Topical Mupirocin Ointment Discharge Instruction: Apply Mupirocin (Bactroban) as instructed Primary Dressing Promogran Prisma Matrix, 4.34 (sq in) (silver collagen) Discharge Instruction: Moisten collagen with hydrogel Secondary Dressing ALLEVYN Heel 4 1/2in x 5 1/2in / 10.5cm x 13.5cm Discharge Instruction: Apply over primary dressing as directed. Woven Gauze Sponge, Non-Sterile 4x4 in Discharge Instruction: Apply over primary dressing as directed. JONPAUL, REGEHR ([id number]) [id number] Page 5 of 5 Secured With [employer] 4x5 (in/yd) Discharge Instruction: Secure with Coban as directed. Kerlix Roll  Sterile, 4.5x3.1 (in/yd) Discharge Instruction: Secure with Kerlix as directed. Compression Wrap Unnaboot w/Calamine, 4x10 (in/yd) Discharge Instruction: Apply to top of leg Compression Stockings Add-Ons Electronic Signature(s) Signed: 04/11/2023 12:18:19 PM By: Thayer Dallas Entered By: Thayer Dallas on 04/11/2023 08:55:50 -------------------------------------------------------------------------------- Vitals Details Patient Name: Date of Service: TO [name] 04/11/2023 11:15 A M Medical Record Number: [id number] Patient Account Number: [id number] Date of Birth/Sex: Treating RN: 1941-03-31 (82 y.o. M) Primary Care Nhung Danko: Guerry Bruin Other Clinician: Referring Rashon Westrup: Treating Keian Odriscoll/Extender: [name] in Treatment: 7 Vital Signs Time Taken: 11:32 Reference Range: 80 - 120 mg / dl Height (in): 71 Weight (lbs): 193 Body Mass Index (BMI): 26.9 Electronic Signature(s) Signed: 04/11/2023 12:18:19 PM By: Thayer Dallas Entered By: Thayer Dallas on 04/11/2023 08:55:43

## 2023-04-11 NOTE — Progress Notes (Signed)
Erik [name], Erik [name] ([id number]) [id number] Page 1 of 1 Visit Report for 04/11/2023 SuperBill Details Patient Name: Date of Service: TO [name] 04/11/2023 Medical Record Number: [id number] Patient Account Number: [id number] Date of Birth/Sex: Treating RN: [birth year] (82 y.o. 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

## 2023-04-14 ENCOUNTER — Encounter (HOSPITAL_BASED_OUTPATIENT_CLINIC_OR_DEPARTMENT_OTHER): Payer: PPO | Admitting: Internal Medicine

## 2023-04-14 DIAGNOSIS — E11621 Type 2 diabetes mellitus with foot ulcer: Secondary | ICD-10-CM | POA: Diagnosis not present

## 2023-04-14 DIAGNOSIS — L97412 Non-pressure chronic ulcer of right heel and midfoot with fat layer exposed: Secondary | ICD-10-CM | POA: Diagnosis not present

## 2023-04-14 NOTE — Telephone Encounter (Signed)
Please see options from [facility]. He has a 7.2% higher stroke risk if he does not take an anticoagulant for his a fib. If he chooses not to take Xarelto or Coumadin, he just needs to understand his risk.

## 2023-04-15 NOTE — Progress Notes (Signed)
Erik, [name] ([id number]) [id number] Page 1 of 7 Visit Report for 04/14/2023 Arrival Information Details Patient Name: Date of Service: TO Erik [name] [facility] W. 04/14/2023 1:30 PM Medical Record Number: [id number] Patient Account Number: [id number] Date of Birth/Sex: Treating RN: [birth year] (82 y.o. M) Primary Care Ledia Hanford: Guerry Bruin Other Clinician: Referring Ravan Schlemmer: Treating Eden Rho/Extender: Wyline Beady in Treatment: 7 Visit Information History Since Last Visit Added or deleted any medications: No Patient Arrived: Ambulatory Any new allergies or adverse reactions: No Arrival Time: 13:36 Had a fall or experienced change in No Accompanied By: wife activities of daily living that may affect Transfer Assistance: None risk of falls: Patient Identification Verified: Yes Signs or symptoms of abuse/neglect since last visito No Secondary Verification Process Completed: Yes Hospitalized since last visit: No Patient Requires Transmission-Based Precautions: No Implantable device outside of the clinic excluding No Patient Has Alerts: Yes cellular tissue based products placed in the center Patient Alerts: ABI: R 1.08 11/14/22 since last visit: Has Dressing in Place as Prescribed: Yes Pain Present Now: No Electronic Signature(s) Signed: 04/14/2023 4:25:57 PM By: Thayer Dallas Entered By: Thayer Dallas on 04/14/2023 10:48:25 -------------------------------------------------------------------------------- Encounter Discharge Information Details Patient Name: Date of Service: TO Erik [name], Erik Fantasia W. 04/14/2023 1:30 PM Medical Record Number: [id number] Patient Account Number: [id number] Date of Birth/Sex: Treating RN: 1940-09-07 (82 y.o. Erik [name] Primary Care Glendon Dunwoody: Guerry Bruin Other Clinician: Referring Hansini Clodfelter: Treating Anjelita Sheahan/Extender: Wyline Beady in Treatment: 7 Encounter  Discharge Information Items Discharge Condition: Stable Ambulatory Status: Ambulatory Discharge Destination: Home Transportation: Private Auto Accompanied By: wife Schedule Follow-up Appointment: Yes Clinical Summary of Care: Electronic Signature(s) Signed: 04/14/2023 5:09:00 PM By: Shawn Stall RN, BSN Entered By: Shawn Stall on 04/14/2023 11:07:42 Erik [name] ([id number]) [phone].pdf Page 2 of 7 -------------------------------------------------------------------------------- Lower Extremity Assessment Details Patient Name: Date of Service: TO Erik [name]. 04/14/2023 1:30 PM Medical Record Number: [id number] Patient Account Number: [id number] Date of Birth/Sex: Treating RN: [birth year] (82 y.o. M) Primary Care Marguetta Windish: Guerry Bruin Other Clinician: Referring Brinton Brandel: Treating Skyylar Kopf/Extender: Wyline Beady in Treatment: 7 Edema Assessment Assessed: Erik [name]: No] [Right: No] Edema: [Left: Ye] [Right: s] Calf Left: Right: Point of Measurement: From Medial Instep 38 cm Ankle Left: Right: Point of Measurement: From Medial Instep 24.3 cm Vascular Assessment Extremity colors, hair growth, and conditions: Extremity Color: [Right:Normal] Hair Growth on Extremity: [Right:No] Temperature of Extremity: [Right:Warm] Capillary Refill: [Right:< 3 seconds] Dependent Rubor: [Right:No Yes] Toe Nail Assessment Left: Right: Thick: No Discolored: No Deformed: No Improper Length and Hygiene: Yes Electronic Signature(s) Signed: 04/14/2023 4:25:57 PM By: Thayer Dallas Entered By: Thayer Dallas on 04/14/2023 10:48:56 -------------------------------------------------------------------------------- Multi Wound Chart Details Patient Name: Date of Service: TO Erik [name], Erik Fantasia W. 04/14/2023 1:30 PM Medical Record Number: [id number] Patient Account Number: [id number] Date of Birth/Sex: Treating RN: [birth year] (82 y.o. M) Primary  Care Jatavia Keltner: Guerry Bruin Other Clinician: Referring Jessah Danser: Treating Doss Cybulski/Extender: Wyline Beady in Treatment: 7 Vital Signs Height(in): 71 Capillary Blood Glucose(mg/dl): 706 Weight(lbs): 237 Pulse(bpm): 47 Body Mass Index(BMI): 26.9 Blood Pressure(mmHg): 146/60 Temperature(F): 97.8 Respiratory Rate(breaths/min): [address] ([id number]) [id number] Page 3 of 7 [1:Photos:] [[name]/A:[name]/A] Right Calcaneus [name]/A [name]/A Wound Location: Gradually Appeared [name]/A [name]/A Wounding Event: Diabetic Wound/Ulcer of the Lower [name]/A [name]/A Primary Etiology: Extremity Anemia, Arrhythmia, Coronary Artery [name]/A [name]/A Comorbid History: Disease, Hypertension, Peripheral Arterial Disease, Type II Diabetes, Neuropathy 01/21/2023 [name]/A [name]/A Date Acquired: 7 [name]/A [name]/A Weeks  of Treatment: Open [name]/A [name]/A Wound Status: No [name]/A [name]/A Wound Recurrence: 0.3x0.4x0.2 [name]/A [name]/A Measurements L x W x D (cm) 0.094 [name]/A [name]/A A (cm) : rea 0.019 [name]/A [name]/A Volume (cm) : 33.30% [name]/A [name]/A % Reduction in A rea: 32.10% [name]/A [name]/A % Reduction in Volume: Grade 2 [name]/A [name]/A Classification: Medium [name]/A [name]/A Exudate A mount: Serosanguineous [name]/A [name]/A Exudate Type: red, brown [name]/A [name]/A Exudate Color: Large (67-100%) [name]/A [name]/A Granulation A mount: Red, Pale [name]/A [name]/A Granulation Quality: Small (1-33%) [name]/A [name]/A Necrotic A mount: Fat Layer (Subcutaneous Tissue): Yes [name]/A [name]/A Exposed Structures: Fascia: No Tendon: No Muscle: No Joint: No Bone: No Small (1-33%) [name]/A [name]/A Epithelialization: Callus: Yes [name]/A [name]/A Periwound Skin Texture: No Abnormality [name]/A [name]/A Temperature: T Contact Cast otal [name]/A [name]/A Procedures Performed: Treatment Notes Wound #1 (Calcaneus) Wound Laterality: Right Cleanser Soap and Water Discharge Instruction: May shower and wash wound with dial antibacterial soap and water prior to dressing change. Vashe 5.8 (oz) Discharge Instruction: Cleanse the wound with Vashe  prior to applying a clean dressing using gauze sponges, not tissue or cotton balls. Peri-Wound Care Triamcinolone 15 (g) Discharge Instruction: Use triamcinolone 15 (g) as directed Sween Lotion (Moisturizing lotion) Discharge Instruction: Apply moisturizing lotion as directed Topical Primary Dressing Endoform 2x2 in Discharge Instruction: Moisten with saline Secondary Dressing ALLEVYN Heel 4 1/2in x 5 1/2in / 10.5cm x 13.5cm Discharge Instruction: Apply over primary dressing as directed. Woven Gauze Sponge, Non-Sterile 4x4 in Discharge Instruction: Apply over primary dressing as directed. T Contact Cast otal Discharge Instruction: last layer applied by Malan Werk. Secured With Erik [name] ([id number]) [id number] Page 4 of 7 Kerlix Roll Sterile, 4.5x3.1 (in/yd) Discharge Instruction: Secure with Kerlix as directed. Compression Wrap Compression Stockings Add-Ons Electronic Signature(s) Signed: 04/14/2023 4:31:26 PM By: [name] MD Entered By: [name] on 04/14/2023 11:18:01 -------------------------------------------------------------------------------- Multi-Disciplinary Care Plan Details Patient Name: Date of Service: TO Erik [name], Erik Presto NK W. 04/14/2023 1:30 PM Medical Record Number: [id number] Patient Account Number: [id number] Date of Birth/Sex: Treating RN: 11-02-1940 (82 y.o. Erik [name] Primary Care Ryatt Corsino: Guerry Bruin Other Clinician: Referring Yusra Ravert: Treating Ethan Clayburn/Extender: Wyline Beady in Treatment: 7 Active Inactive Wound/Skin Impairment Nursing Diagnoses: Impaired tissue integrity Knowledge deficit related to ulceration/compromised skin integrity Goals: Patient/caregiver will verbalize understanding of skin care regimen Date Initiated: 02/18/2023 Target Resolution Date: 05/04/2023 Goal Status: Active Ulcer/skin breakdown will have a volume reduction of 30% by week 4 Date  Initiated: 02/18/2023 Target Resolution Date: 05/26/2023 Goal Status: Active Interventions: Assess patient/caregiver ability to obtain necessary supplies Assess patient/caregiver ability to perform ulcer/skin care regimen upon admission and as needed Assess ulceration(s) every visit Provide education on ulcer and skin care Notes: Electronic Signature(s) Signed: 04/14/2023 5:09:00 PM By: Shawn Stall RN, BSN Entered By: Shawn Stall on 04/14/2023 11:02:34 -------------------------------------------------------------------------------- Pain Assessment Details Patient Name: Date of Service: TO Erik [name], Erik Fantasia W. 04/14/2023 1:30 PM Medical Record Number: [id number] Patient Account Number: [id number] Date of Birth/Sex: Treating RN: Dec 11, 1940 (82 y.o. M) Primary Care Kiyra Slaubaugh: Guerry Bruin Other Clinician: Sofie [name] ([id number]) [id number] Page 5 of 7 Referring Dayquan Buys: Treating Kristin Barcus/Extender: Wyline Beady in Treatment: 7 Active Problems Location of Pain Severity and Description of Pain Patient Has Paino No Site Locations Pain Management and Medication Current Pain Management: Electronic Signature(s) Signed: 04/14/2023 4:25:57 PM By: Thayer Dallas Entered By: Thayer Dallas on 04/14/2023 10:48:34 -------------------------------------------------------------------------------- Patient/Caregiver Education Details Patient Name: Date of Service: TO Erik [name], Erik NK W. 11/18/2024andnbsp1:30  PM Medical Record Number: [id number] Patient Account Number: [id number] Date of Birth/Gender: Treating RN: 07-18-1940 (82 y.o. Erik [name] Primary Care Physician: Guerry Bruin Other Clinician: Referring Physician: Treating Physician/Extender: Wyline Beady in Treatment: 7 Education Assessment Education Provided To: Patient Education Topics Provided Wound/Skin Impairment: Handouts: Caring for Your  Ulcer Methods: Explain/Verbal Responses: Reinforcements needed Electronic Signature(s) Signed: 04/14/2023 5:09:00 PM By: Shawn Stall RN, BSN Entered By: Shawn Stall on 04/14/2023 11:02:47 Erik [name] ([id number]) [id number] Page 6 of 7 -------------------------------------------------------------------------------- Wound Assessment Details Patient Name: Date of Service: TO Erik [name]. 04/14/2023 1:30 PM Medical Record Number: [id number] Patient Account Number: [id number] Date of Birth/Sex: Treating RN: Apr 29, 1941 (82 y.o. M) Primary Care Rosaura Bolon: Guerry Bruin Other Clinician: Referring Karyme Mcconathy: Treating Jefferie Holston/Extender: Wyline Beady in Treatment: 7 Wound Status Wound Number: 1 Primary Diabetic Wound/Ulcer of the Lower Extremity Etiology: Wound Location: Right Calcaneus Wound Open Wounding Event: Gradually Appeared Status: Date Acquired: 01/21/2023 Comorbid Anemia, Arrhythmia, Coronary Artery Disease, Hypertension, Weeks Of Treatment: 7 History: Peripheral Arterial Disease, Type II Diabetes, Neuropathy Clustered Wound: No Photos Wound Measurements Length: (cm) 0.3 Width: (cm) 0.4 Depth: (cm) 0.2 Area: (cm) 0.094 Volume: (cm) 0.019 % Reduction in Area: 33.3% % Reduction in Volume: 32.1% Epithelialization: Small (1-33%) Tunneling: No Undermining: No Wound Description Classification: Grade 2 Exudate Amount: Medium Exudate Type: Serosanguineous Exudate Color: red, brown Wound Bed Granulation Amount: Large (67-100%) Exposed Structure Granulation Quality: Red, Pale Fascia Exposed: No Necrotic Amount: Small (1-33%) Fat Layer (Subcutaneous Tissue) Exposed: Yes Necrotic Quality: Adherent Slough Tendon Exposed: No Muscle Exposed: No Joint Exposed: No Bone Exposed: No Periwound Skin Texture Texture Color No Abnormalities Noted: No No Abnormalities Noted: No Callus: Yes Temperature /  Pain Temperature: No Abnormality Moisture No Abnormalities Noted: No Treatment Notes Wound #1 (Calcaneus) Wound Laterality: Right Cleanser Soap and Water Discharge Instruction: May shower and wash wound with dial antibacterial soap and water prior to dressing change. Erik [name], Erik [name] ([id number]) [id number] Page 7 of 7 Vashe 5.8 (oz) Discharge Instruction: Cleanse the wound with Vashe prior to applying a clean dressing using gauze sponges, not tissue or cotton balls. Peri-Wound Care Triamcinolone 15 (g) Discharge Instruction: Use triamcinolone 15 (g) as directed Sween Lotion (Moisturizing lotion) Discharge Instruction: Apply moisturizing lotion as directed Topical Primary Dressing Endoform 2x2 in Discharge Instruction: Moisten with saline Secondary Dressing ALLEVYN Heel 4 1/2in x 5 1/2in / 10.5cm x 13.5cm Discharge Instruction: Apply over primary dressing as directed. Woven Gauze Sponge, Non-Sterile 4x4 in Discharge Instruction: Apply over primary dressing as directed. T Contact Cast otal Discharge Instruction: last layer applied by Erik [name]. Secured With Erik [name], 4.5x3.1 (in/yd) Discharge Instruction: Secure with Kerlix as directed. Compression Wrap Compression Stockings Add-Ons Electronic Signature(s) Signed: 04/14/2023 4:25:57 PM By: Thayer Dallas Entered By: Thayer Dallas on 04/14/2023 10:52:20 -------------------------------------------------------------------------------- Vitals Details Patient Name: Date of Service: TO Erik [name], Erik Presto NK W. 04/14/2023 1:30 PM Medical Record Number: [id number] Patient Account Number: [id number] Date of Birth/Sex: Treating RN: 1940/10/07 (82 y.o. M) Primary Care Muriah Harsha: Guerry Bruin Other Clinician: Referring Maurisa Tesmer: Treating Aariyah Sampey/Extender: Wyline Beady in Treatment: 7 Vital Signs Time Taken: 13:58 Temperature (F): 97.8 Height (in): 71 Pulse (bpm):  47 Weight (lbs): 193 Respiratory Rate (breaths/min): 18 Body Mass Index (BMI): 26.9 Blood Pressure (mmHg): 146/60 Capillary Blood Glucose (mg/dl): 884 Reference [name]: 80 - 120 mg / dl Electronic Signature(s) Signed: 04/14/2023 4:25:57 PM By: Thayer Dallas Entered By: Thayer Dallas on 04/14/2023 10:55:23

## 2023-04-15 NOTE — Progress Notes (Signed)
Erik [name], Erik [name] ([id number]) [id number] Page 1 of 6 Visit Report for 04/14/2023 HPI Details Patient Name: Date of Service: TO Erik [name] NK W. 04/14/2023 1:30 PM Medical Record Number: [id number] Patient Account Number: [id number] Date of Birth/Sex: Treating RN: Nov 17, 1940 (82 y.o. M) Primary Care Provider: Guerry Bruin Other Clinician: Referring Provider: Treating Provider/Extender: Wyline Beady in Treatment: 7 History of Present Illness HPI Description: ADMISSION 02/18/2023 This is an 82 year old still very active man who was referred to [place] by Dr. Ardelle Anton at [facility]. He has a nonhealing wound on the plantar aspect of his right heel. He has been dealing with this since June when he presented to Dr. Loreta Ave with a painful area on the right mid heel. He was not really sure how this happened. He ultimately had an MRI in July that showed no osteomyelitis but there was a small abscess he was treated with antibiotics doxycycline and gradually the pain improved but he still been left with a small open area. He had an ABI on 11/14/2022 that was 1.08 on the right and 0.99 on the left. He by enlarge has been using Silvadene on the wound. Recently changed to silver alginate. He also developed a blister on the right anterior lower leg. Recent x-rays have not shown any cortical changes. Also in June he had a sedimentation rate of 2 and a C-reactive protein of 14 not suggestive of chronic active infection Past medical history includes type 2 diabetes, nonischemic cardiomyopathy, hypertension cholesterolemia, skin cancer, combined congestive heart failure, paroxysmal A-fib, idiopathic trigeminal neuralgia. 10/1; ulcer on the plantar aspect of his right heel. He is being compliant with the healing boot. We have been using Hydrofera Blue heel cup gauze. Dimensions are better 10/7; the area on the leg right anterior is healed. He has chronic  insufficiency. Unfortunately the ulcer on the plantar aspect of his right heel looks about the same each time with undermining the skin is not adherent to the underlying tissue. This requires debridement. We have been using Hydrofera Blue, heel cup and heel offloading shoe. We are not making progress with the heel ulcer. 10/15; I brought the patient in today with the intention of putting him in a total contact cast if the wound on his plantar heel on the right was no better. However he comes in with erythema spreading towards the tip of his heel. There is also bogginess. There is no crepitus. He is a very active man. He is in the heel off loader but I do not think for 1 reason or another this is doing the job in terms of pressure and friction relief. I am concerned enough about the wound today not to put on a total contact cast 10/22; culture I did last week showed Staphylococcus epidermidis which is likely a skin contaminant but he also grew Staphylococcus capitis which was resistant to the doxycycline I put him on. I am going to give him a week of trimethoprim/sulfamethoxazole for this. I am not sure of the relevance of the coag negative staph however I do not want to put a cast and something that is infected. We have been using Iodoflex 10/29; patient with a right plantar heel wound. This was initially probably infectious with an MRI showing a small abscess but no osteomyelitis. 2 weeks ago he had erythema around the wound and bogginess but no crepitus I treated him with a prolonged course of antibiotics last week giving and will further week of Septra. We  have been using mupirocin and silver alginate. The wound is measuring smaller today but there is undermining at 0.9 cm almost circumferentially 11/7; right plantar heel. This was not any smaller this week with considerable relative undermining almost circumferentially. This required debridement. I wanted to put this in a total contact cast but the  patient was in a sling for a gene that we would not have been able to get off over the cast. 11/18; small wound with significant relative depth. We have been using endoform and we will put a total contact cast on him for the first time today. Electronic Signature(s) Signed: 04/14/2023 4:31:26 PM By: [name] MD Entered By: [name] on 04/14/2023 11:19:17 -------------------------------------------------------------------------------- Physical Exam Details Patient Name: Date of Service: TO Erik [name] NK W. 04/14/2023 1:30 PM Medical Record Number: [id number] Patient Account Number: [id number] Date of Birth/Sex: Treating RN: January 16, 1941 (82 y.o. M) Primary Care Provider: Guerry Bruin Other Clinician: Referring Provider: Treating Provider/Extender: Wyline Beady in Treatment: [address] ([id number]) [id number] Page 2 of 6 Constitutional Sitting or standing Blood Pressure is within target range for patient.. Pulse regular and within target range for patient.[name] Respirations regular, non-labored and within target range.. Temperature is normal and within the target range for the patient.[name] Appears in no distress. Notes Wound exam; plantar right heel ulcer. This appears somewhat smaller today but with still significant relative depth. No debridement was necessary. Electronic Signature(s) Signed: 04/14/2023 4:31:26 PM By: [name] MD Entered By: [name] on 04/14/2023 11:21:37 -------------------------------------------------------------------------------- Physician Orders Details Patient Name: Date of Service: TO Erik [name] NK W. 04/14/2023 1:30 PM Medical Record Number: [id number] Patient Account Number: [id number] Date of Birth/Sex: Treating RN: [birth year] (82 y.o. 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 - Dr. Lady Gary covering for Dr. Leanord Hawking Tuesday 0815 04/12/2023 room 1. (Front office to schedule this time) Return A ppointment in 2 weeks. - 04/29/2023 Dr. Leanord Hawking Tuesday [facility] office to schedule) Return A ppointment in: - 04/16/2023 cast change only with Dr. Leanord Hawking (already scheduled) Return A Anesthetic Wound #1 Right Calcaneus (In clinic) Topical Lidocaine 5% applied to wound bed Bathing/ Shower/ Hygiene May shower with protection but do not get wound dressing(s) wet. Protect dressing(s) with water repellant cover (for example, large plastic bag) or a cast cover and may then take shower. - purchase cast protector from [facility], [facility] or [employer] Off-Loading Total Contact Cast to Right Lower Extremity - size 3 Wound Treatment Wound #1 - Calcaneus Wound Laterality: Right Cleanser: Soap and Water 1 x Per Week/30 Days Discharge Instructions: May shower and wash wound with dial antibacterial soap and water prior to dressing change. Cleanser: Vashe 5.8 (oz) 1 x Per Week/30 Days Discharge Instructions: Cleanse the wound with Vashe prior to applying a clean dressing using gauze sponges, not tissue or cotton balls. Peri-Wound Care: Triamcinolone 15 (g) 1 x Per Week/30 Days Discharge Instructions: Use triamcinolone 15 (g) as directed Peri-Wound Care: Sween Lotion (Moisturizing lotion) 1 x Per Week/30 Days Discharge Instructions:  Apply moisturizing lotion as directed Prim Dressing:  Endoform 2x2 in 1 x Per Week/30 Days ary Discharge Instructions: Moisten with saline Secondary Dressing: ALLEVYN Heel 4 1/2in x 5 1/2in / 10.5cm x 13.5cm 1 x Per Week/30 Days Discharge Instructions: Apply over primary dressing as directed. EINER, FERKO ([id number]) [id number] Page 3 of 6 Secondary Dressing: Woven Gauze Sponge, Non-Sterile 4x4 in 1 x Per Week/30 Days Discharge Instructions: Apply over primary dressing as directed. Secondary Dressing: T Contact Cast 1 x Per Week/30 Days otal Discharge Instructions: last layer applied by provider. Secured With: [employer], 4.5x3.1 (in/yd) 1 x Per Week/30 Days Discharge Instructions: Secure with Kerlix as directed. Electronic Signature(s) Signed: 04/14/2023 4:31:26 PM By: [name] MD Signed: 04/14/2023 5:09:00 PM By: Shawn Stall RN, BSN Entered By: Shawn Stall on 04/14/2023 11:06:59 -------------------------------------------------------------------------------- Problem List Details Patient Name: Date of Service: TO Erik [name] NK W. 04/14/2023 1:30 PM Medical Record Number: [id number] Patient Account Number: [id number] Date of Birth/Sex: Treating RN: Aug 27, 1940 (82 y.o. [name] Primary Care Provider: Guerry Bruin Other Clinician: Referring Provider: Treating Provider/Extender: Wyline Beady in Treatment: 7 Active Problems ICD-10 Encounter Code Description Active Date MDM Diagnosis E11.621 Type 2 diabetes mellitus with foot ulcer 02/18/2023 No Yes 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 02/18/2023 No Yes severity I87.331 Chronic venous hypertension (idiopathic) with ulcer and inflammation of right 02/18/2023 No Yes lower extremity Inactive Problems ICD-10 Code Description Active Date Inactive Date L97.811 Non-pressure chronic ulcer of other part of right lower leg limited to breakdown of skin 02/18/2023  02/18/2023 Resolved Problems Electronic Signature(s) Signed: 04/14/2023 4:31:26 PM By: [name] MD Entered By: [name] on 04/14/2023 11:17:53 [name] ([id number]) [id number] Page 4 of 6 -------------------------------------------------------------------------------- Progress Note Details Patient Name: Date of Service: TO [name] 04/14/2023 1:30 PM Medical Record Number: [id number] Patient Account Number: [id number] Date of Birth/Sex: Treating RN: [birth year] (82 y.o. M) Primary Care Provider: Guerry Bruin Other Clinician: Referring Provider: Treating Provider/Extender: Wyline Beady in Treatment: 7 Subjective History of Present Illness (HPI) ADMISSION 02/18/2023 This is an 82 year old still very active man who was referred to [place] by Dr. Ardelle Anton at [facility]. He has a nonhealing wound on the plantar aspect of his right heel. He has been dealing with this since June when he presented to Dr. Loreta Ave with a painful area on the right mid heel. He was not really sure how this happened. He ultimately had an MRI in July that showed no osteomyelitis but there was a small abscess he was treated with antibiotics doxycycline and gradually the pain improved but he still been left with a small open area. He had an ABI on 11/14/2022 that was 1.08 on the right and 0.99 on the left. He by enlarge has been using Silvadene on the wound. Recently changed to silver alginate. He also developed a blister on the right anterior lower leg. Recent x-rays have not shown any cortical changes. Also in June he had a sedimentation rate of 2 and a C-reactive protein of 14 not suggestive of chronic active infection Past medical history includes type 2 diabetes, nonischemic cardiomyopathy, hypertension cholesterolemia, skin cancer, combined congestive heart failure, paroxysmal A-fib, idiopathic trigeminal neuralgia. 10/1; ulcer on  the plantar aspect of his right heel. He is being compliant with the healing boot. We have been using Hydrofera Blue heel cup gauze. Dimensions are better 10/7; the area on  the leg right anterior is healed. He has chronic insufficiency. Unfortunately the ulcer on the plantar aspect of his right heel looks about the same each time with undermining the skin is not adherent to the underlying tissue. This requires debridement. We have been using Hydrofera Blue, heel cup and heel offloading shoe. We are not making progress with the heel ulcer. 10/15; I brought the patient in today with the intention of putting him in a total contact cast if the wound on his plantar heel on the right was no better. However he comes in with erythema spreading towards the tip of his heel. There is also bogginess. There is no crepitus. He is a very active man. He is in the heel off loader but I do not think for 1 reason or another this is doing the job in terms of pressure and friction relief. I am concerned enough about the wound today not to put on a total contact cast 10/22; culture I did last week showed Staphylococcus epidermidis which is likely a skin contaminant but he also grew Staphylococcus capitis which was resistant to the doxycycline I put him on. I am going to give him a week of trimethoprim/sulfamethoxazole for this. I am not sure of the relevance of the coag negative staph however I do not want to put a cast and something that is infected. We have been using Iodoflex 10/29; patient with a right plantar heel wound. This was initially probably infectious with an MRI showing a small abscess but no osteomyelitis. 2 weeks ago he had erythema around the wound and bogginess but no crepitus I treated him with a prolonged course of antibiotics last week giving and will further week of Septra. We have been using mupirocin and silver alginate. The wound is measuring smaller today but there is undermining at 0.9 cm almost  circumferentially 11/7; right plantar heel. This was not any smaller this week with considerable relative undermining almost circumferentially. This required debridement. I wanted to put this in a total contact cast but the patient was in a sling for a gene that we would not have been able to get off over the cast. 11/18; small wound with significant relative depth. We have been using endoform and we will put a total contact cast on him for the first time today. Objective Constitutional Sitting or standing Blood Pressure is within target range for patient.. Pulse regular and within target range for patient.[name] Respirations regular, non-labored and within target range.. Temperature is normal and within the target range for the patient.[name] Appears in no distress. Vitals Time Taken: 1:58 PM, Height: 71 in, Weight: 193 lbs, BMI: 26.9, Temperature: 97.8 F, Pulse: 47 bpm, Respiratory Rate: 18 breaths/min, Blood Pressure: 146/60 mmHg, Capillary Blood Glucose: 100 mg/dl. General Notes: Wound exam; plantar right heel ulcer. This appears somewhat smaller today but with still significant relative depth. No debridement was necessary. Integumentary (Hair, Skin) Wound #1 status is Open. Original cause of wound was Gradually Appeared. The date acquired was: 01/21/2023. The wound has been in treatment 7 weeks. The wound is located on the Right Calcaneus. The wound measures 0.3cm length x 0.4cm width x 0.2cm depth; 0.094cm^2 area and 0.019cm^3 volume. There is Fat Layer (Subcutaneous Tissue) exposed. There is no tunneling or undermining noted. There is a medium amount of serosanguineous drainage noted. There is large (67-100%) red, pale granulation within the wound bed. There is a small (1-33%) amount of necrotic tissue within the wound bed including Adherent Slough. The periwound  skin appearance exhibited: Callus. Periwound temperature was noted as No Abnormality. 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 There was a T Contact Cast otal Procedure by Maxwell Caul., MD. Post procedure Diagnosis Wound #1: Same as Pre-Procedure Plan Follow-up Appointments: Return Appointment in 1 week. - Dr. Lady Gary covering for Dr. Leanord Hawking Tuesday 0815 04/12/2023 room 1. (Front office to schedule this time) Return Appointment in 2 weeks. - 04/29/2023 Dr. Leanord Hawking Tuesday [facility] office to schedule) Return Appointment in: - 04/16/2023 cast change only with Dr. Leanord Hawking (already scheduled) Anesthetic: Wound #1 Right Calcaneus: (In clinic) Topical Lidocaine 5% applied to wound bed Bathing/ Shower/ Hygiene: May shower with protection but do not get wound dressing(s) wet. Protect dressing(s) with water repellant cover (for example, large plastic bag) or a cast cover and may then take shower. - purchase cast protector from [facility], [facility] or [employer] Off-Loading: T Contact Cast to Right Lower Extremity - size 3 otal WOUND #1: - Calcaneus Wound Laterality: Right Cleanser: Soap and Water 1 x Per Week/30 Days Discharge Instructions: May shower and wash wound with dial antibacterial soap and water prior to dressing change. Cleanser: Vashe 5.8 (oz) 1 x Per Week/30 Days Discharge Instructions: Cleanse the wound with Vashe prior to applying a clean dressing using gauze sponges, not tissue or cotton balls. Peri-Wound Care: Triamcinolone 15 (g) 1 x Per Week/30 Days Discharge Instructions: Use triamcinolone 15 (g) as directed Peri-Wound Care: Sween Lotion (Moisturizing lotion) 1 x Per  Week/30 Days Discharge Instructions: Apply moisturizing lotion as directed Prim Dressing: Endoform 2x2 in 1 x Per Week/30 Days ary Discharge Instructions: Moisten with saline Secondary Dressing: ALLEVYN Heel 4 1/2in x 5 1/2in / 10.5cm x 13.5cm 1 x Per Week/30 Days Discharge Instructions: Apply over primary dressing as directed. Secondary Dressing: Woven Gauze Sponge, Non-Sterile 4x4 in 1 x Per Week/30 Days Discharge Instructions: Apply over primary dressing as directed. Secondary Dressing: T Contact Cast 1 x Per Week/30 Days otal Discharge Instructions: last layer applied by provider. Secured With: [employer], 4.5x3.1 (in/yd) 1 x Per Week/30 Days Discharge Instructions: Secure with Kerlix as directed. 1. Continue with endoform, heel cup, gauze 2. T contact cast #1 applied today. otal Electronic Signature(s) Signed: 04/14/2023 4:31:26 PM By: [name] MD Entered By: [name] on 04/14/2023 11:22:26 -------------------------------------------------------------------------------- Total Contact Cast Details Patient Name: Date of Service: TO Erik [name] NK W. 04/14/2023 1:30 PM Medical Record Number: [id number] Patient Account Number: [id number] EMBER, PULLUM ([id number]) [id number].pdf Page 6 of 6 Date of Birth/Sex: Treating RN: [birth year] (82 y.o. M) Primary Care Provider: Guerry Bruin Other Clinician: Referring Provider: Treating Provider/Extender: Wyline Beady in Treatment: 7 T Contact Cast Applied for Wound Assessment: otal Wound #1 Right Calcaneus Performed By: Physician Maxwell Caul., MD The following information was scribed by: Shawn Stall The information was scribed for: [name] Post Procedure Diagnosis Same as Pre-procedure Electronic Signature(s) Signed: 04/14/2023 4:31:26 PM By: [name] MD Entered By: [name] on 04/14/2023  11:18:14 -------------------------------------------------------------------------------- SuperBill Details Patient Name: Date of Service: TO [name] 04/14/2023 Medical Record Number: [id number] Patient Account Number: [id number] Date of  Birth/Sex: Treating RN: 10/25/1940 (82 y.o. 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

## 2023-04-16 ENCOUNTER — Encounter (HOSPITAL_BASED_OUTPATIENT_CLINIC_OR_DEPARTMENT_OTHER): Payer: PPO | Admitting: Internal Medicine

## 2023-04-16 DIAGNOSIS — L97412 Non-pressure chronic ulcer of right heel and midfoot with fat layer exposed: Secondary | ICD-10-CM | POA: Diagnosis not present

## 2023-04-16 DIAGNOSIS — E11621 Type 2 diabetes mellitus with foot ulcer: Secondary | ICD-10-CM | POA: Diagnosis not present

## 2023-04-16 NOTE — Progress Notes (Signed)
Erik, [name] ([id number]) [id number] Page 1 of 6 Visit Report for 04/16/2023 HPI Details Patient Name: Date of Service: TO Erik [name]. 04/16/2023 1:30 PM Medical Record Number: [id number] Patient Account Number: [id number] Date of Birth/Sex: Treating RN: 17-Dec-1940 (82 y.o. M) Primary Care Provider: Guerry [name] Other Clinician: Referring Provider: Treating Provider/Extender: Erik [name] in Treatment: 8 History of Present Illness HPI Description: ADMISSION 02/18/2023 This is an 82 year old still very active man who was referred to [place] by Dr. Ardelle [name] at [facility]. He has a nonhealing wound on the plantar aspect of his right heel. He has been dealing with this since June when he presented to Dr. Loreta [name] with a painful area on the right mid heel. He was not really sure how this happened. He ultimately had an MRI in July that showed no osteomyelitis but there was a small abscess he was treated with antibiotics doxycycline and gradually the pain improved but he still been left with a small open area. He had an ABI on 11/14/2022 that was 1.08 on the right and 0.99 on the left. He by enlarge has been using Silvadene on the wound. Recently changed to silver alginate. He also developed a blister on the right anterior lower leg. Recent x-rays have not shown any cortical changes. Also in June he had a sedimentation rate of 2 and a C-reactive protein of 14 not suggestive of chronic active infection Past medical history includes type 2 diabetes, nonischemic cardiomyopathy, hypertension cholesterolemia, skin cancer, combined congestive heart failure, paroxysmal A-fib, idiopathic trigeminal neuralgia. 10/1; ulcer on the plantar aspect of his right heel. He is being compliant with the healing boot. We have been using Hydrofera Blue heel cup gauze. Dimensions are better 10/7; the area on the leg right anterior is healed. He has chronic  insufficiency. Unfortunately the ulcer on the plantar aspect of his right heel looks about the same each time with undermining the skin is not adherent to the underlying tissue. This requires debridement. We have been using Hydrofera Blue, heel cup and heel offloading shoe. We are not making progress with the heel ulcer. 10/15; I brought the patient in today with the intention of putting him in a total contact cast if the wound on his plantar heel on the right was no better. However he comes in with erythema spreading towards the tip of his heel. There is also bogginess. There is no crepitus. He is a very active man. He is in the heel off loader but I do not think for 1 reason or another this is doing the job in terms of pressure and friction relief. I am concerned enough about the wound today not to put on a total contact cast 10/22; culture I did last week showed Staphylococcus epidermidis which is likely a skin contaminant but he also grew Staphylococcus capitis which was resistant to the doxycycline I put him on. I am going to give him a week of trimethoprim/sulfamethoxazole for this. I am not sure of the relevance of the coag negative staph however I do not want to put a cast and something that is infected. We have been using Iodoflex 10/29; patient with a right plantar heel wound. This was initially probably infectious with an MRI showing a small abscess but no osteomyelitis. 2 weeks ago he had erythema around the wound and bogginess but no crepitus I treated him with a prolonged course of antibiotics last week giving and will further week of Septra. We  have been using mupirocin and silver alginate. The wound is measuring smaller today but there is undermining at 0.9 cm almost circumferentially 11/7; right plantar heel. This was not any smaller this week with considerable relative undermining almost circumferentially. This required debridement. I wanted to put this in a total contact cast but the  patient was in a sling for a gene that we would not have been able to get off over the cast. 11/18; small wound with significant relative depth. We have been using endoform and we will put a total contact cast on him for the first time today. 11/20; in for the first obligatory total contact cast we have been using endoform small punched-out area on the right plantar heel seems stable to improved Electronic Signature(s) Signed: 04/16/2023 4:37:54 PM By: Erik Najjar MD Entered By: Erik [name] on 04/16/2023 11:50:20 -------------------------------------------------------------------------------- Physical Exam Details Patient Name: Date of Service: TO Erik [name], Erik Fantasia W. 04/16/2023 1:30 PM Medical Record Number: [id number] Patient Account Number: [id number] Date of Birth/Sex: Treating RN: 1940-12-01 (82 y.o. M) Primary Care Provider: Guerry [name] Other Clinician: Referring Provider: Treating Provider/Extender: Erik [name] in Treatment: [address] ([id number]) [id number] Page 2 of 6 Constitutional Sitting or standing Blood Pressure is within target [name] for patient.. Pulse regular and within target [name] for patient.[name] Respirations regular, non-labored and within target [name].. Temperature is normal and within the target [name] for the patient.[name] Appears in no distress. Notes Wound exam; plantar right heel ulcer. Very small seems to have come in somewhat. Still significant relative depth however. No infection Electronic Signature(s) Signed: 04/16/2023 4:37:54 PM By: Erik Najjar MD Entered By: Erik [name] on 04/16/2023 11:51:07 -------------------------------------------------------------------------------- Physician Orders Details Patient Name: Date of Service: TO Erik [name], Erik Presto NK W. 04/16/2023 1:30 PM Medical Record Number: [id number] Patient Account Number: [id number] Date of Birth/Sex: Treating RN: [birth year] (82  y.o. 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 - Dr. Lady Gary covering for Dr. Leanord Hawking Tuesday 0815 04/22/2023 room 1. (already schedule) Return A ppointment in 2 weeks. - 04/29/2023 0915 Dr. Leanord Hawking Tuesday (already schedule) Return A Anesthetic Wound #1 Right Calcaneus (In clinic) Topical Lidocaine 5% applied to wound bed Bathing/ Shower/ Hygiene May shower with protection but do not get wound dressing(s) wet. Protect dressing(s) with water repellant cover (for example, large plastic bag) or a cast cover and may then take shower. - purchase cast protector from [facility], [facility] or [employer] Off-Loading Total Contact Cast to Right Lower Extremity - size 3 Wound Treatment Wound #1 - Calcaneus Wound Laterality: Right Cleanser: Soap and Water 1 x Per Week/30 Days Discharge Instructions: May shower and wash wound with dial antibacterial soap and water prior to dressing change. Cleanser: Vashe 5.8 (oz) 1 x Per Week/30 Days Discharge Instructions: Cleanse the wound with Vashe prior to applying a clean dressing using gauze sponges, not tissue or cotton balls. Peri-Wound Care: Triamcinolone 15 (g) 1 x Per Week/30 Days Discharge Instructions: Use triamcinolone 15 (g) as directed Peri-Wound Care: Sween Lotion (Moisturizing lotion)  1 x Per Week/30 Days Discharge Instructions: Apply moisturizing lotion as  directed Prim Dressing: Endoform 2x2 in 1 x Per Week/30 Days ary Discharge Instructions: Moisten with saline Secondary Dressing: ALLEVYN Heel 4 1/2in x 5 1/2in / 10.5cm x 13.5cm 1 x Per Week/30 Days TIBOR, ZAKOWSKI ([id number]) [id number] Page 3 of 6 Discharge Instructions: Apply over primary dressing as directed. ****AND TO TOES. Secondary Dressing: Optifoam Non-Adhesive Dressing, 4x4 in 1 x Per Week/30 Days Discharge Instructions: Apply lateral side of foot Secondary Dressing: Woven Gauze Sponge, Non-Sterile 4x4 in 1 x Per Week/30 Days Discharge Instructions: Apply over primary dressing as directed. Secondary Dressing: T Contact Cast 1 x Per Week/30 Days otal Discharge Instructions: last layer applied by provider. Secured With: [employer], 4.5x3.1 (in/yd) 1 x Per Week/30 Days Discharge Instructions: Secure with Kerlix as directed. Electronic Signature(s) Signed: 04/16/2023 4:36:51 PM By: Shawn Stall RN, BSN Signed: 04/16/2023 4:37:54 PM By: Erik Najjar MD Entered By: Shawn Stall on 04/16/2023 11:31:04 -------------------------------------------------------------------------------- Problem List Details Patient Name: Date of Service: TO [name] 04/16/2023 1:30 PM Medical Record Number: [id number] Patient Account Number: [id number] Date of Birth/Sex: Treating RN: 1940-10-06 (82 y.o. Erik [name] Primary Care Provider: Guerry [name] Other Clinician: Referring Provider: Treating Provider/Extender: Erik [name] in Treatment: 8 Active Problems ICD-10 Encounter Code Description Active Date MDM Diagnosis E11.621 Type 2 diabetes mellitus with foot ulcer 02/18/2023 No Yes 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 02/18/2023 No Yes severity I87.331 Chronic venous hypertension (idiopathic) with ulcer and inflammation of right 02/18/2023 No Yes lower  extremity Inactive Problems ICD-10 Code Description Active Date Inactive Date L97.811 Non-pressure chronic ulcer of other part of right lower leg limited to breakdown of skin 02/18/2023 02/18/2023 Resolved Problems Electronic Signature(s) Signed: 04/16/2023 4:37:54 PM By: Erik Najjar MD Entered By: Erik [name] on 04/16/2023 11:45:02 [name] ([id number]) [id number] Page 4 of 6 -------------------------------------------------------------------------------- Progress Note Details Patient Name: Date of Service: TO Erik [name]. 04/16/2023 1:30 PM Medical Record Number: [id number] Patient Account Number: [id number] Date of Birth/Sex: Treating RN: [birth year] (82 y.o. M) Primary Care Provider: Guerry [name] Other Clinician: Referring Provider: Treating Provider/Extender: Erik [name] in Treatment: 8 Subjective History of Present Illness (HPI) ADMISSION 02/18/2023 This is an 82 year old still very active man who was referred to [place] by Dr. Ardelle [name] at [facility]. He has a nonhealing wound on the plantar aspect of his right heel. He has been dealing with this since June when he presented to Dr. Loreta [name] with a painful area on the right mid heel. He was not really sure how this happened. He ultimately had an MRI in July that showed no osteomyelitis but there was a small abscess he was treated with antibiotics doxycycline and gradually the pain improved but he still been left with a small open area. He had an ABI on 11/14/2022 that was 1.08 on the right and 0.99 on the left. He by enlarge has been using Silvadene on the wound. Recently changed to silver alginate. He also developed a blister on the right anterior lower leg. Recent x-rays have not shown any cortical changes. Also in June he had a sedimentation rate of 2 and a C-reactive protein of 14 not suggestive of chronic active infection Past medical history includes  type 2 diabetes, nonischemic cardiomyopathy, hypertension cholesterolemia, skin cancer, combined congestive heart failure, paroxysmal A-fib, idiopathic trigeminal neuralgia. 10/1; ulcer on the plantar  aspect of his right heel. He is being compliant with the healing boot. We have been using Hydrofera Blue heel cup gauze. Dimensions are better 10/7; the area on the leg right anterior is healed. He has chronic insufficiency. Unfortunately the ulcer on the plantar aspect of his right heel looks about the same each time with undermining the skin is not adherent to the underlying tissue. This requires debridement. We have been using Hydrofera Blue, heel cup and heel offloading shoe. We are not making progress with the heel ulcer. 10/15; I brought the patient in today with the intention of putting him in a total contact cast if the wound on his plantar heel on the right was no better. However he comes in with erythema spreading towards the tip of his heel. There is also bogginess. There is no crepitus. He is a very active man. He is in the heel off loader but I do not think for 1 reason or another this is doing the job in terms of pressure and friction relief. I am concerned enough about the wound today not to put on a total contact cast 10/22; culture I did last week showed Staphylococcus epidermidis which is likely a skin contaminant but he also grew Staphylococcus capitis which was resistant to the doxycycline I put him on. I am going to give him a week of trimethoprim/sulfamethoxazole for this. I am not sure of the relevance of the coag negative staph however I do not want to put a cast and something that is infected. We have been using Iodoflex 10/29; patient with a right plantar heel wound. This was initially probably infectious with an MRI showing a small abscess but no osteomyelitis. 2 weeks ago he had erythema around the wound and bogginess but no crepitus I treated him with a prolonged course of  antibiotics last week giving and will further week of Septra. We have been using mupirocin and silver alginate. The wound is measuring smaller today but there is undermining at 0.9 cm almost circumferentially 11/7; right plantar heel. This was not any smaller this week with considerable relative undermining almost circumferentially. This required debridement. I wanted to put this in a total contact cast but the patient was in a sling for a gene that we would not have been able to get off over the cast. 11/18; small wound with significant relative depth. We have been using endoform and we will put a total contact cast on him for the first time today. 11/20; in for the first obligatory total contact cast we have been using endoform small punched-out area on the right plantar heel seems stable to improved Objective Constitutional Sitting or standing Blood Pressure is within target [name] for patient.. Pulse regular and within target [name] for patient.[name] Respirations regular, non-labored and within target [name].. Temperature is normal and within the target [name] for the patient.[name] Appears in no distress. Vitals Time Taken: 2:10 PM, Height: 71 in, Weight: 193 lbs, BMI: 26.9, Temperature: 97.9 F, Pulse: 72 bpm, Respiratory Rate: 18 breaths/min, Blood Pressure: 129/72 mmHg. General Notes: Wound exam; plantar right heel ulcer. Very small seems to have come in somewhat. Still significant relative depth however. No infection Integumentary (Hair, Skin) Wound #1 status is Open. Original cause of wound was Gradually Appeared. The date acquired was: 01/21/2023. The wound has been in treatment 8 weeks. The wound is located on the Right Calcaneus. The wound measures 0.2cm length x 0.2cm width x 0.2cm depth; 0.031cm^2 area and 0.006cm^3 volume. There is  Fat Layer (Subcutaneous Tissue) exposed. There is no tunneling or undermining noted. There is a medium amount of serosanguineous drainage noted. The wound margin is  distinct with the outline attached to the wound base. There is large (67-100%) red, pale granulation within the wound bed. There is a small (1- 33%) amount of necrotic tissue within the wound bed including Adherent Slough. The periwound skin appearance had no abnormalities noted for moisture. The periwound skin appearance had no abnormalities noted for color. The periwound skin appearance exhibited: Callus. Periwound temperature was noted as No JAMARQUES, ERICH ([id number]) [id number] Page 5 of 6 Abnormality. 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 There was a T Contact Cast otal Procedure by Maxwell Caul., MD. Post procedure Diagnosis Wound #1: Same as Pre-Procedure Notes: size 3. Plan Follow-up Appointments: Return Appointment in 1 week. - Dr. Lady Gary covering for Dr. Leanord Hawking Tuesday 0815 04/22/2023 room 1. (already schedule) Return Appointment in 2 weeks. - 04/29/2023 0915 Dr. Leanord Hawking Tuesday (already schedule) Anesthetic: Wound #1 Right Calcaneus: (In clinic) Topical Lidocaine 5% applied to wound bed Bathing/ Shower/ Hygiene: May shower with protection but do not get wound dressing(s) wet. Protect dressing(s) with water repellant cover (for example, large plastic bag) or a cast cover and may then take shower. - purchase cast protector from [facility], [facility] or [employer] Off-Loading: T Contact Cast to Right Lower Extremity - size 3 otal WOUND #1: - Calcaneus Wound Laterality: Right Cleanser: Soap and Water 1 x Per Week/30 Days Discharge Instructions: May shower and wash wound with dial antibacterial soap and water  prior to dressing change. Cleanser: Vashe 5.8 (oz) 1 x Per Week/30 Days Discharge Instructions: Cleanse the wound with Vashe prior to applying a clean dressing using gauze sponges, not tissue or cotton balls. Peri-Wound Care: Triamcinolone 15 (g) 1 x Per Week/30 Days Discharge Instructions: Use triamcinolone 15 (g) as directed Peri-Wound Care: Sween Lotion (Moisturizing lotion) 1 x Per Week/30 Days Discharge Instructions: Apply moisturizing lotion as directed Prim Dressing: Endoform 2x2 in 1 x Per Week/30 Days ary Discharge Instructions: Moisten with saline Secondary Dressing: ALLEVYN Heel 4 1/2in x 5 1/2in / 10.5cm x 13.5cm 1 x Per Week/30 Days Discharge Instructions: Apply over primary dressing as directed. ****AND TO TOES. Secondary Dressing: Optifoam Non-Adhesive Dressing, 4x4 in 1 x Per Week/30 Days Discharge Instructions: Apply lateral side of foot Secondary Dressing: Woven Gauze Sponge, Non-Sterile 4x4 in 1 x Per Week/30 Days Discharge Instructions: Apply over primary dressing as directed. Secondary Dressing: T Contact Cast 1 x Per Week/30 Days otal Discharge Instructions: last layer applied by provider. Secured With: [employer], 4.5x3.1 (in/yd) 1 x Per Week/30 Days Discharge Instructions: Secure with Kerlix as directed. 1. Still continue with endoform as the primary wound 2. T contact cast change in the standard fashion otal Electronic Signature(s) Signed: 04/16/2023 4:37:54 PM By: Erik Najjar MD Entered By: Erik [name] on 04/16/2023 11:54:19 [name] ([id number]) [id number] Page 6 of 6 -------------------------------------------------------------------------------- Total Contact Cast Details Patient Name: Date of Service: TO Erik [name]. 04/16/2023 1:30 PM Medical Record Number: [id number] Patient Account Number: [id number] Date of Birth/Sex: Treating RN: Aug 26, 1940 (82 y.o. M) Primary Care Provider: Guerry [name]  Other  Clinician: Referring Provider: Treating Provider/Extender: Erik [name] in Treatment: 8 T Contact Cast Applied for Wound Assessment: otal Wound #1 Right Calcaneus Performed By: Physician Maxwell Caul., MD The following information was scribed by: Shawn Stall The information was scribed for: Gregery Na Procedure Diagnosis Same as Pre-procedure Notes size 3 Electronic Signature(s) Signed: 04/16/2023 4:37:54 PM By: Erik Najjar MD Entered By: Erik [name] on 04/16/2023 11:50:35 -------------------------------------------------------------------------------- SuperBill Details Patient Name: Date of Service: TO [name] 04/16/2023 Medical Record Number: [id number] Patient Account Number: [id number] Date of Birth/Sex: Treating RN: 08/03/1940 (82 y.o. 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

## 2023-04-18 NOTE — Progress Notes (Signed)
AD, GLAVE ([id number]) [id number] Page 1 of 7 Visit Report for 04/16/2023 Arrival Information Details Patient Name: Date of Service: TO Erik [name] [facility] W. 04/16/2023 1:30 PM Medical Record Number: [id number] Patient Account Number: [id number] Date of Birth/Sex: Treating RN: 07-09-1940 (82 y.o. M) Primary Care Pelham Hennick: Guerry Bruin Other Clinician: Referring Brittny Spangle: Treating Sameeha Rockefeller/Extender: Wyline Beady in Treatment: 8 Visit Information History Since Last Visit Added or deleted any medications: No Patient Arrived: Erik [name] Any new allergies or adverse reactions: No Arrival Time: 14:09 Had a fall or experienced change in No Accompanied By: self activities of daily living that may affect Transfer Assistance: None risk of falls: Patient Identification Verified: Yes Signs or symptoms of abuse/neglect since last No Secondary Verification Process Completed: Yes visito Patient Requires Transmission-Based Precautions: No Hospitalized since last visit: No Patient Has Alerts: Yes Implantable device outside of the clinic No Patient Alerts: ABI: R 1.08 11/14/22 excluding cellular tissue based products placed in the center since last visit: Has Dressing in Place as Prescribed: Yes Has Footwear/Offloading in Place as Yes Prescribed: Right: Removable Cast Walker/Walking Boot T Contact Cast otal Pain Present Now: No Electronic Signature(s) Signed: 04/18/2023 11:01:27 AM By: Thayer Dallas Entered By: Thayer Dallas on 04/16/2023 14:10:13 -------------------------------------------------------------------------------- Encounter Discharge Information Details Patient Name: Date of Service: TO Erik [name], Erik Fantasia W. 04/16/2023 1:30 PM Medical Record Number: [id number] Patient Account Number: [id number] Date of Birth/Sex: Treating RN: [birth year] (82 y.o. Erik [name] Primary Care Azaryah Heathcock: Guerry Bruin Other  Clinician: Referring Trek Kimball: Treating Akiel Fennell/Extender: Wyline Beady in Treatment: 8 Encounter Discharge Information Items Discharge Condition: Stable Ambulatory Status: Walker Discharge Destination: Home Transportation: Private Auto Accompanied By: self Schedule Follow-up Appointment: Yes Clinical Summary of Care: Electronic Signature(s) Signed: 04/16/2023 4:36:51 PM By: Shawn Stall RN, BSN Entered By: Shawn Stall on 04/16/2023 14:31:45 Erik [name] ([id number]) [id number] Page 2 of 7 -------------------------------------------------------------------------------- Lower Extremity Assessment Details Patient Name: Date of Service: TO Erik [name]. 04/16/2023 1:30 PM Medical Record Number: [id number] Patient Account Number: [id number] Date of Birth/Sex: Treating RN: 01-18-1941 (82 y.o. M) Primary Care Gordie Belvin: Guerry Bruin Other Clinician: Referring Tobyn Osgood: Treating Chaunice Obie/Extender: Wyline Beady in Treatment: 8 Edema Assessment Assessed: [name]: No] [Right: No] Edema: [Left: Ye] [Right: s] Calf Left: Right: Point of Measurement: From Medial Instep 39.1 cm Ankle Left: Right: Point of Measurement: From Medial Instep 25 cm Vascular Assessment Pulses: Dorsalis Pedis Palpable: [Right:Yes] Extremity colors, hair growth, and conditions: Extremity Color: [Right:Normal] Hair Growth on Extremity: [Right:No] Temperature of Extremity: [Right:Warm] Capillary Refill: [Right:< 3 seconds] Dependent Rubor: [Right:No Yes] Electronic Signature(s) Signed: 04/18/2023 11:01:27 AM By: Thayer Dallas Entered By: Thayer Dallas on 04/16/2023 14:13:36 -------------------------------------------------------------------------------- Multi Wound Chart Details Patient Name: Date of Service: TO Erik [name], Erik Fantasia W. 04/16/2023 1:30 PM Medical Record Number: [id number] Patient Account Number:  [id number] Date of Birth/Sex: Treating RN: [birth year] (82 y.o. M) Primary Care Samra Pesch: Guerry Bruin Other Clinician: Referring Braelon Sprung: Treating Neera Teng/Extender: Wyline Beady in Treatment: 8 Vital Signs Height(in): 71 Pulse(bpm): 72 Weight(lbs): 193 Blood Pressure(mmHg): 129/72 Body Mass Index(BMI): 26.9 Temperature(F): 97.9 Respiratory Rate(breaths/min): 18 STETSEN, NEPTUNE ([id number]) [1:Photos:] [N/A:N/A] Right Calcaneus N/A N/A Wound Location: Gradually Appeared N/A N/A Wounding Event: Diabetic Wound/Ulcer of the Lower N/A N/A Primary Etiology: Extremity Anemia, Arrhythmia, Coronary Artery N/A N/A Comorbid History: Disease, Hypertension, Peripheral Arterial Disease, Type II Diabetes, Neuropathy 01/21/2023 N/A N/A Date Acquired: 8 N/A N/A Weeks of Treatment: Open N/A  N/A Wound Status: No N/A N/A Wound Recurrence: 0.2x0.2x0.2 N/A N/A Measurements L x W x D (cm) 0.031 N/A N/A A (cm) : rea 0.006 N/A N/A Volume (cm) : 78.00% N/A N/A % Reduction in A rea: 78.60% N/A N/A % Reduction in Volume: Grade 2 N/A N/A Classification: Medium N/A N/A Exudate A mount: Serosanguineous N/A N/A Exudate Type: red, brown N/A N/A Exudate Color: Distinct, outline attached N/A N/A Wound Margin: Large (67-100%) N/A N/A Granulation A mount: Red, Pale N/A N/A Granulation Quality: Small (1-33%) N/A N/A Necrotic A mount: Fat Layer (Subcutaneous Tissue): Yes N/A N/A Exposed Structures: Fascia: No Tendon: No Muscle: No Joint: No Bone: No Medium (34-66%) N/A N/A Epithelialization: Callus: Yes N/A N/A Periwound Skin Texture: No Abnormalities Noted N/A N/A Periwound Skin Moisture: No Abnormalities Noted N/A N/A Periwound Skin Color: No Abnormality N/A N/A Temperature: T Contact Cast otal N/A N/A Procedures Performed: Treatment Notes Wound #1 (Calcaneus) Wound Laterality: Right Cleanser Soap and Water Discharge Instruction: May  shower and wash wound with dial antibacterial soap and water prior to dressing change. Vashe 5.8 (oz) Discharge Instruction: Cleanse the wound with Vashe prior to applying a clean dressing using gauze sponges, not tissue or cotton balls. Peri-Wound Care Triamcinolone 15 (g) Discharge Instruction: Use triamcinolone 15 (g) as directed Sween Lotion (Moisturizing lotion) Discharge Instruction: Apply moisturizing lotion as directed Topical Primary Dressing Endoform 2x2 in Discharge Instruction: Moisten with saline Secondary Dressing ALLEVYN Heel 4 1/2in x 5 1/2in / 10.5cm x 13.5cm Discharge Instruction: Apply over primary dressing as directed. ****AND TO TOES. Optifoam Non-Adhesive Dressing, 4x4 in Discharge Instruction: Apply lateral side of foot Woven Gauze Sponge, Non-Sterile 4x4 in Discharge Instruction: Apply over primary dressing as directed. T Contact Cast otal Discharge Instruction: last layer applied by Khoury Siemon. Erik, [name] ([id number]) [id number] Page 4 of 7 Secured With [employer], 4.5x3.1 (in/yd) Discharge Instruction: Secure with Kerlix as directed. Compression Wrap Compression Stockings Add-Ons Electronic Signature(s) Signed: 04/16/2023 4:37:54 PM By: [name] MD Entered By: [name] on 04/16/2023 14:45:57 -------------------------------------------------------------------------------- Multi-Disciplinary Care Plan Details Patient Name: Date of Service: TO Erik [name], Erik Presto NK W. 04/16/2023 1:30 PM Medical Record Number: [id number] Patient Account Number: [id number] Date of Birth/Sex: Treating RN: 12-06-1940 (82 y.o. Erik [name] Primary Care Chaston Bradburn: Guerry Bruin Other Clinician: Referring Dominque Marlin: Treating Dresden Ament/Extender: Wyline Beady in Treatment: 8 Active Inactive Wound/Skin Impairment Nursing Diagnoses: Impaired tissue integrity Knowledge deficit related to  ulceration/compromised skin integrity Goals: Patient/caregiver will verbalize understanding of skin care regimen Date Initiated: 02/18/2023 Target Resolution Date: 05/23/2023 Goal Status: Active Ulcer/skin breakdown will have a volume reduction of 30% by week 4 Date Initiated: 02/18/2023 Target Resolution Date: 05/26/2023 Goal Status: Active Interventions: Assess patient/caregiver ability to obtain necessary supplies Assess patient/caregiver ability to perform ulcer/skin care regimen upon admission and as needed Assess ulceration(s) every visit Provide education on ulcer and skin care Notes: Electronic Signature(s) Signed: 04/16/2023 4:36:51 PM By: Shawn Stall RN, BSN Entered By: Shawn Stall on 04/16/2023 13:56:56 -------------------------------------------------------------------------------- Pain Assessment Details Patient Name: Date of Service: [name] 04/16/2023 1:30 PM Medical Record Number: [id number] Patient Account Number: [id number] Erik, [name] ([id number]) [phone].pdf Page 5 of 7 Date of Birth/Sex: Treating RN: [birth year] (82 y.o. M) Primary Care Lavalle Skoda: Guerry Bruin Other Clinician: Referring Arlissa Monteverde: Treating Kashina Mecum/Extender: Wyline Beady in Treatment: 8 Active Problems Location of Pain Severity and Description of Pain Patient Has Paino No Site Locations Pain Management and Medication Current  Pain Management: Electronic Signature(s) Signed: 04/18/2023 11:01:27 AM By: Thayer Dallas Entered By: Thayer Dallas on 04/16/2023 14:10:53 -------------------------------------------------------------------------------- Patient/Caregiver Education Details Patient Name: Date of Service: TO Erik [name] 11/20/2024andnbsp1:30 PM Medical Record Number: [id number] Patient Account Number: [id number] Date of Birth/Gender: Treating RN: 1941/03/15 (82 y.o. Erik [name] Primary Care Physician:  Guerry Bruin Other Clinician: Referring Physician: Treating Physician/Extender: Wyline Beady in Treatment: 8 Education Assessment Education Provided To: Patient Education Topics Provided Wound/Skin Impairment: Handouts: Caring for Your Ulcer Methods: Explain/Verbal Responses: Reinforcements needed Electronic Signature(s) Signed: 04/16/2023 4:36:51 PM By: Shawn Stall RN, BSN Entered By: Shawn Stall on 04/16/2023 13:58:31 Erik [name] ([id number]) [id number] Page 6 of 7 -------------------------------------------------------------------------------- Wound Assessment Details Patient Name: Date of Service: TO Erik [name]. 04/16/2023 1:30 PM Medical Record Number: [id number] Patient Account Number: [id number] Date of Birth/Sex: Treating RN: 11-Jul-1940 (82 y.o. M) Primary Care Stanislaus Kaltenbach: Guerry Bruin Other Clinician: Referring Lai Hendriks: Treating Makalia Bare/Extender: Wyline Beady in Treatment: 8 Wound Status Wound Number: 1 Primary Diabetic Wound/Ulcer of the Lower Extremity Etiology: Wound Location: Right Calcaneus Wound Open Wounding Event: Gradually Appeared Status: Date Acquired: 01/21/2023 Comorbid Anemia, Arrhythmia, Coronary Artery Disease, Hypertension, Weeks Of Treatment: 8 History: Peripheral Arterial Disease, Type II Diabetes, Neuropathy Clustered Wound: No Photos Wound Measurements Length: (cm) 0. Width: (cm) 0. Depth: (cm) 0. Area: (cm) 0 Volume: (cm) 0 2 % Reduction in Area: 78% 2 % Reduction in Volume: 78.6% 2 Epithelialization: Medium (34-66%) .031 Tunneling: No .006 Undermining: No Wound Description Classification: Grade 2 Wound Margin: Distinct, outline attached Exudate Amount: Medium Exudate Type: Serosanguineous Exudate Color: red, brown Foul Odor After Cleansing: No Slough/Fibrino Yes Wound Bed Granulation Amount: Large (67-100%) Exposed  Structure Granulation Quality: Red, Pale Fascia Exposed: No Necrotic Amount: Small (1-33%) Fat Layer (Subcutaneous Tissue) Exposed: Yes Necrotic Quality: Adherent Slough Tendon Exposed: No Muscle Exposed: No Joint Exposed: No Bone Exposed: No Periwound Skin Texture Texture Color No Abnormalities Noted: No No Abnormalities Noted: Yes Callus: Yes Temperature / Pain Temperature: No Abnormality Moisture No Abnormalities Noted: Yes Treatment Notes Wound #1 (Calcaneus) Wound Laterality: Right Cleanser Soap and [address] Erik [name], Erik [name] ([id number]) [id number].pdf Page 7 of 7 Discharge Instruction: May shower and wash wound with dial antibacterial soap and water prior to dressing change. Vashe 5.8 (oz) Discharge Instruction: Cleanse the wound with Vashe prior to applying a clean dressing using gauze sponges, not tissue or cotton balls. Peri-Wound Care Triamcinolone 15 (g) Discharge Instruction: Use triamcinolone 15 (g) as directed Sween Lotion (Moisturizing lotion) Discharge Instruction: Apply moisturizing lotion as directed Topical Primary Dressing Endoform 2x2 in Discharge Instruction: Moisten with saline Secondary Dressing ALLEVYN Heel 4 1/2in x 5 1/2in / 10.5cm x 13.5cm Discharge Instruction: Apply over primary dressing as directed. ****AND TO TOES. Optifoam Non-Adhesive Dressing, 4x4 in Discharge Instruction: Apply lateral side of foot Woven Gauze Sponge, Non-Sterile 4x4 in Discharge Instruction: Apply over primary dressing as directed. T Contact Cast otal Discharge Instruction: last layer applied by Erik [name]. Secured With [employer], 4.5x3.1 (in/yd) Discharge Instruction: Secure with Kerlix as directed. Compression Wrap Compression Stockings Add-Ons Electronic Signature(s) Signed: 04/16/2023 4:36:51 PM By: Shawn Stall RN, BSN Entered By: Shawn Stall on 04/16/2023  14:17:33 -------------------------------------------------------------------------------- Vitals Details Patient Name: Date of Service: TO Erik [name], Erik Presto NK W. 04/16/2023 1:30 PM Medical Record Number: [id number] Patient Account Number: [id number] Date of Birth/Sex: Treating RN: 1940-08-11 (82 y.o. M) Primary Care Paighton Godette: Guerry Bruin Other Clinician: Referring Deovion Batrez: Treating  Erik [name]/Extender: Wyline Beady in Treatment: 8 Vital Signs Time Taken: 14:10 Temperature (F): 97.9 Height (in): 71 Pulse (bpm): 72 Weight (lbs): 193 Respiratory Rate (breaths/min): 18 Body Mass Index (BMI): 26.9 Blood Pressure (mmHg): 129/72 Reference [name]: 80 - 120 mg / dl Electronic Signature(s) Signed: 04/18/2023 11:01:27 AM By: Thayer Dallas Entered By: Thayer Dallas on 04/16/2023 14:10:47

## 2023-04-21 NOTE — Telephone Encounter (Signed)
S/w pt aware of stroke risk off anti coag. Pt is working with Dr. Wylene Simmer to get xarelto at 0 cost. Pt stated if it doesn't work out will call office. Will send to [facility] to [place].

## 2023-04-22 ENCOUNTER — Encounter (HOSPITAL_BASED_OUTPATIENT_CLINIC_OR_DEPARTMENT_OTHER): Payer: PPO | Admitting: General Surgery

## 2023-04-22 DIAGNOSIS — L97412 Non-pressure chronic ulcer of right heel and midfoot with fat layer exposed: Secondary | ICD-10-CM | POA: Diagnosis not present

## 2023-04-22 DIAGNOSIS — E11621 Type 2 diabetes mellitus with foot ulcer: Secondary | ICD-10-CM | POA: Diagnosis not present

## 2023-04-23 NOTE — Progress Notes (Signed)
BUNNY, SMEDBERG ([id number]) [id number] Page 1 of 10 Visit Report for 04/22/2023 Chief Complaint Document Details Patient Name: Date of Service: TO [name] 04/22/2023 8:15 A M Medical Record Number: [id number] Patient Account Number: [id number] Date of Birth/Sex: Treating RN: 1941-02-03 (82 y.o. M) Primary Care Provider: Guerry Bruin Other Clinician: Referring Provider: Treating Provider/Extender: [name] in Treatment: 9 Information Obtained from: Patient Chief Complaint 02/18/2023; patient is here for review of a wound in the right mid plantar foot Electronic Signature(s) Signed: 04/22/2023 9:46:47 AM By: Duanne Guess MD FACS Entered By: Duanne Guess on 04/22/2023 09:46:47 -------------------------------------------------------------------------------- Debridement Details Patient Name: Date of Service: TO [name] 04/22/2023 8:15 A M Medical Record Number: [id number] Patient Account Number: [id number] Date of Birth/Sex: Treating RN: Jun 30, 1940 (82 y.o. Damaris Schooner Primary Care Provider: Guerry Bruin Other Clinician: Referring Provider: Treating Provider/Extender: [name] in Treatment: 9 Debridement Performed for Assessment: Wound #1 Right Calcaneus Performed By: Physician Duanne Guess, MD The following information was scribed by: Zenaida Deed The information was scribed for: Duanne Guess Debridement Type: Debridement Severity of Tissue Pre Debridement: Fat layer exposed Level of Consciousness (Pre-procedure): Awake and Alert Pre-procedure Verification/Time Out Yes - 09:00 Taken: Start Time: 09:04 Pain Control: Lidocaine 4% T opical Solution Percent of Wound Bed Debrided: 100% T Area Debrided (cm): otal 0.49 Tissue and other material debrided: Viable, Non-Viable, Callus, Slough, Subcutaneous, Skin: Epidermis, Slough Level:  Skin/Subcutaneous Tissue Debridement Description: Excisional Instrument: Curette Bleeding: Minimum Hemostasis Achieved: Pressure Procedural Pain: 0 Post Procedural Pain: 0 Response to Treatment: Procedure was tolerated well Level of Consciousness (Post- Awake and Alert procedure): Post Debridement Measurements of Total Wound Length: (cm) 0.7 Width: (cm) 0.9 Depth: (cm) 0.4 Volume: (cm) 0.198 ALVAR, HUSCHER ([id number]) [id number] Page 2 of 10 Character of Wound/Ulcer Post Debridement: Improved Severity of Tissue Post Debridement: Fat layer exposed Post Procedure Diagnosis Same as Pre-procedure Electronic Signature(s) Signed: 04/22/2023 10:29:44 AM By: Duanne Guess MD FACS Signed: 04/22/2023 5:13:28 PM By: Zenaida Deed RN, BSN Entered By: Zenaida Deed on 04/22/2023 09:11:11 -------------------------------------------------------------------------------- HPI Details Patient Name: Date of Service: TO [name] 04/22/2023 8:15 A M Medical Record Number: [id number] Patient Account Number: [id number] Date of Birth/Sex: Treating RN: 10-01-1940 (82 y.o. M) Primary Care Provider: Guerry Bruin Other Clinician: Referring Provider: Treating Provider/Extender: [name] in Treatment: 9 History of Present Illness HPI Description: ADMISSION 02/18/2023 This is an 82 year old still very active man who was referred to [place] by Dr. Ardelle Anton at [facility]. He has a nonhealing wound on the plantar aspect of his right heel. He has been dealing with this since June when he presented to Dr. Loreta Ave with a painful area on the right mid heel. He was not really sure how this happened. He ultimately had an MRI in July that showed no osteomyelitis but there was a small abscess he was treated with antibiotics doxycycline and gradually the pain improved but he still been left with a small open area. He had an ABI on 11/14/2022  that was 1.08 on the right and 0.99 on the left. He by enlarge has been using Silvadene on the wound. Recently changed to silver alginate. He also developed a blister on the right anterior lower leg. Recent x-rays have not shown any cortical changes. Also in June he had a sedimentation rate of 2 and a C-reactive protein of 14 not suggestive of chronic active infection  Past medical history includes type 2 diabetes, nonischemic cardiomyopathy, hypertension cholesterolemia, skin cancer, combined congestive heart failure, paroxysmal A-fib, idiopathic trigeminal neuralgia. 10/1; ulcer on the plantar aspect of his right heel. He is being compliant with the healing boot. We have been using Hydrofera Blue heel cup gauze. Dimensions are better 10/7; the area on the leg right anterior is healed. He has chronic insufficiency. Unfortunately the ulcer on the plantar aspect of his right heel looks about the same each time with undermining the skin is not adherent to the underlying tissue. This requires debridement. We have been using Hydrofera Blue, heel cup and heel offloading shoe. We are not making progress with the heel ulcer. 10/15; I brought the patient in today with the intention of putting him in a total contact cast if the wound on his plantar heel on the right was no better. However he comes in with erythema spreading towards the tip of his heel. There is also bogginess. There is no crepitus. He is a very active man. He is in the heel off loader but I do not think for 1 reason or another this is doing the job in terms of pressure and friction relief. I am concerned enough about the wound today not to put on a total contact cast 10/22; culture I did last week showed Staphylococcus epidermidis which is likely a skin contaminant but he also grew Staphylococcus capitis which was resistant to the doxycycline I put him on. I am going to give him a week of trimethoprim/sulfamethoxazole for this. I am not sure of  the relevance of the coag negative staph however I do not want to put a cast and something that is infected. We have been using Iodoflex 10/29; patient with a right plantar heel wound. This was initially probably infectious with an MRI showing a small abscess but no osteomyelitis. 2 weeks ago he had erythema around the wound and bogginess but no crepitus I treated him with a prolonged course of antibiotics last week giving and will further week of Septra. We have been using mupirocin and silver alginate. The wound is measuring smaller today but there is undermining at 0.9 cm almost circumferentially 11/7; right plantar heel. This was not any smaller this week with considerable relative undermining almost circumferentially. This required debridement. I wanted to put this in a total contact cast but the patient was in a sling for a gene that we would not have been able to get off over the cast. 11/18; small wound with significant relative depth. We have been using endoform and we will put a total contact cast on him for the first time today. 11/20; in for the first obligatory total contact cast we have been using endoform small punched-out area on the right plantar heel seems stable to improved 04/22/2023: There is a fair amount of undermining created by some skin and callus. There is some slough on the surface. He does have a small abrasion on his anterior tibial surface and dorsal foot from his cast. Electronic Signature(s) Signed: 04/22/2023 9:47:27 AM By: Duanne Guess MD FACS Entered By: Duanne Guess on 04/22/2023 09:47:27 [name] ([id number]) [id number] Page 3 of 10 -------------------------------------------------------------------------------- Physical Exam Details Patient Name: Date of Service: TO [name] 04/22/2023 8:15 A M Medical Record Number: [id number] Patient Account Number: [id number] Date of Birth/Sex: Treating RN: 12/15/1940 (82  y.o. M) Primary Care Provider: Guerry Bruin Other Clinician: Referring Provider: Treating Provider/Extender: [name] in Treatment:  9 Constitutional . . . . no acute distress. Respiratory Normal work of breathing on room air.. Notes 04/22/2023: There is a fair amount of undermining created by some skin and callus. There is some slough on the surface. He does have a small abrasion on his anterior tibial surface and dorsal foot from his cast. Electronic Signature(s) Signed: 04/22/2023 9:49:12 AM By: Duanne Guess MD FACS Entered By: Duanne Guess on 04/22/2023 09:49:12 -------------------------------------------------------------------------------- Physician Orders Details Patient Name: Date of Service: TO [name] 04/22/2023 8:15 A M Medical Record Number: [id number] Patient Account Number: [id number] Date of Birth/Sex: Treating RN: 28-Aug-1940 (82 y.o. 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 - 04/29/2023 0915 Dr. Leanord Hawking Tuesday (already schedule) Return A Anesthetic Wound #1 Right Calcaneus (In clinic) Topical Lidocaine 5% applied to wound bed (In clinic) Topical Lidocaine 4% applied to wound bed Bathing/ Shower/ Hygiene May shower with protection but do not get wound dressing(s) wet. Protect dressing(s) with water repellant cover  (for example, large plastic bag) or a cast cover and may then take shower. - purchase cast protector from [facility], [facility] or [employer] Off-Loading Total Contact Cast to Right Lower Extremity - size 3 Wound Treatment DONACIANO, SZOTT ([id number]) [id number] Page 4 of 10 Wound #1 - Calcaneus Wound Laterality: Right Cleanser: Soap and Water 1 x Per Week/30 Days Discharge Instructions: May shower and wash wound with dial antibacterial soap and water prior to dressing change. Cleanser: Vashe 5.8 (oz) 1 x Per Week/30 Days Discharge Instructions: Cleanse the wound with Vashe prior to applying a clean dressing using gauze sponges, not tissue or cotton balls. Peri-Wound Care: Triamcinolone 15 (g) 1 x Per Week/30 Days Discharge Instructions: Use triamcinolone 15 (g) as directed Peri-Wound Care: Sween Lotion (Moisturizing lotion) 1 x Per Week/30 Days Discharge Instructions: Apply moisturizing lotion as directed Prim Dressing: Endoform 2x2 in 1 x Per Week/30 Days ary Discharge Instructions: Moisten with saline Secondary Dressing: ALLEVYN Heel 4 1/2in x 5 1/2in / 10.5cm x 13.5cm 1 x Per Week/30 Days Discharge Instructions: Apply over primary dressing as directed. ****AND TO TOES. Secondary Dressing: Optifoam Non-Adhesive Dressing, 4x4 in 1 x Per Week/30 Days Discharge Instructions: Apply lateral side of foot Secondary Dressing: Woven Gauze Sponge, Non-Sterile 4x4 in 1 x Per Week/30 Days Discharge Instructions: Apply over primary dressing as directed. Secondary Dressing: T Contact Cast 1 x Per Week/30 Days otal Discharge Instructions: last layer applied by provider. Electronic Signature(s) Signed: 04/22/2023 10:29:44 AM By: Duanne Guess MD FACS Entered By: Duanne Guess on 04/22/2023 09:49:30 -------------------------------------------------------------------------------- Problem List Details Patient Name: Date of Service: TO [name] 04/22/2023 8:15 A  M Medical Record Number: [id number] Patient Account Number: [id number] Date of Birth/Sex: Treating RN: [birth year] (82 y.o. Damaris Schooner Primary Care Provider: Guerry Bruin Other Clinician: Referring Provider: Treating Provider/Extender: [name] in Treatment: 9 Active Problems ICD-10 Encounter Code Description Active Date MDM Diagnosis 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 02/18/2023 No Yes severity  E11.621 Type 2 diabetes mellitus with foot ulcer 02/18/2023 No Yes I87.331 Chronic venous hypertension (idiopathic) with ulcer and inflammation of right 02/18/2023 No Yes lower extremity Inactive Problems ICD-10 Code Description Active Date Inactive Date SADE, LACHAT ([id number]) [id number] Page 5 of 10 L97.811 Non-pressure chronic ulcer of other part of right lower leg limited to breakdown of skin 02/18/2023 02/18/2023 Resolved Problems Electronic Signature(s) Signed: 04/22/2023 9:39:52 AM By: Duanne Guess MD FACS Entered By: Duanne Guess on 04/22/2023 09:39:52 -------------------------------------------------------------------------------- Progress Note Details Patient Name: Date of Service: TO [name] 04/22/2023 8:15 A M Medical Record Number: [id number] Patient Account Number: [id number] Date of Birth/Sex: Treating RN: 15-Sep-1940 (82 y.o. M) Primary Care Provider: Guerry Bruin Other Clinician: Referring Provider: Treating Provider/Extender: [name] in Treatment: 9 Subjective Chief Complaint Information obtained from Patient 02/18/2023; patient is here for review of a wound in the right mid plantar foot History of Present Illness (HPI) ADMISSION 02/18/2023 This is an 82 year old still very active man who was referred to [place] by Dr. Ardelle Anton at [facility]. He has a nonhealing wound on the plantar aspect of his  right heel. He has been dealing with this since June when he presented to Dr. Loreta Ave with a painful area on the right mid heel. He was not really sure how this happened. He ultimately had an MRI in July that showed no osteomyelitis but there was a small abscess he was treated with antibiotics doxycycline and gradually the pain improved but he still been left with a small open area. He had an ABI on 11/14/2022 that was 1.08 on the right and 0.99 on the left. He by enlarge has been using Silvadene on the wound. Recently changed to silver alginate. He also developed a blister on the right anterior lower leg. Recent x-rays have not shown any cortical changes. Also in June he had a sedimentation rate of 2 and a C-reactive protein of 14 not suggestive of chronic active infection Past medical history includes type 2 diabetes, nonischemic cardiomyopathy, hypertension cholesterolemia, skin cancer, combined congestive heart failure, paroxysmal A-fib, idiopathic trigeminal neuralgia. 10/1; ulcer on the plantar aspect of his right heel. He is being compliant with the healing boot. We have been using Hydrofera Blue heel cup gauze. Dimensions are better 10/7; the area on the leg right anterior is healed. He has chronic insufficiency. Unfortunately the ulcer on the plantar aspect of his right heel looks about the same each time with undermining the skin is not adherent to the underlying tissue. This requires debridement. We have been using Hydrofera Blue, heel cup and heel offloading shoe. We are not making progress with the heel ulcer. 10/15; I brought the patient in today with the intention of putting him in a total contact cast if the wound on his plantar heel on the right was no better. However he comes in with erythema spreading towards the tip of his heel. There is also bogginess. There is no crepitus. He is a very active man. He is in the heel off loader but I do not think for 1 reason or another this is doing  the job in terms of pressure and friction relief. I am concerned enough about the wound today not to put on a total contact cast 10/22; culture I did last week showed Staphylococcus epidermidis which is likely a skin contaminant but he also grew Staphylococcus capitis which was resistant to the doxycycline I put him on. I am going to  give him a week of trimethoprim/sulfamethoxazole for this. I am not sure of the relevance of the coag negative staph however I do not want to put a cast and something that is infected. We have been using Iodoflex 10/29; patient with a right plantar heel wound. This was initially probably infectious with an MRI showing a small abscess but no osteomyelitis. 2 weeks ago he had erythema around the wound and bogginess but no crepitus I treated him with a prolonged course of antibiotics last week giving and will further week of Septra. We have been using mupirocin and silver alginate. The wound is measuring smaller today but there is undermining at 0.9 cm almost circumferentially 11/7; right plantar heel. This was not any smaller this week with considerable relative undermining almost circumferentially. This required debridement. I wanted to put this in a total contact cast but the patient was in a sling for a gene that we would not have been able to get off over the cast. 11/18; small wound with significant relative depth. We have been using endoform and we will put a total contact cast on him for the first time today. 11/20; in for the first obligatory total contact cast we have been using endoform small punched-out area on the right plantar heel seems stable to improved 04/22/2023: There is a fair amount of undermining created by some skin and callus. There is some slough on the surface. He does have a small abrasion on his anterior tibial surface and dorsal foot from his cast. Patient History Family History Cancer - Father,Maternal Grandparents, Heart Disease -  Mother. Social History SADAMU, DISPENZA ([id number]) [id number] Page 6 of 10 Never smoker, Marital Status - Single, Alcohol Use - Never, Drug Use - No History, Caffeine Use - Never. Medical History Hematologic/Lymphatic Patient has history of Anemia Cardiovascular Patient has history of Arrhythmia - afib, Coronary Artery Disease, Hypertension, Peripheral Arterial Disease Endocrine Patient has history of Type II Diabetes Neurologic Patient has history of Neuropathy Hospitalization/Surgery History - hysterectomy 07/14. - cystoscopy 7/14. - c-section 1993. - cholecystectomy. Medical A Surgical History Notes nd Constitutional Symptoms (General Health) cellulitis left leg 10/2017 Eyes cataract surgery about 71yrs ago Cardiovascular heart murmur PVCs Gastrointestinal gerd gallstones Genitourinary CKD stage III benign prostatic hyperplasia Psychiatric depression Objective Constitutional no acute distress. Vitals Time Taken: 8:31 AM, Height: 71 in, Weight: 193 lbs, BMI: 26.9, Temperature: 97.9 F, Pulse: 61 bpm, Respiratory Rate: 18 breaths/min, Blood Pressure: 125/48 mmHg, Capillary Blood Glucose: 169 mg/dl. General Notes: glucose per pt report this am Respiratory Normal work of breathing on room air.. General Notes: 04/22/2023: There is a fair amount of undermining created by some skin and callus. There is some slough on the surface. He does have a small abrasion on his anterior tibial surface and dorsal foot from his cast. Integumentary (Hair, Skin) Wound #1 status is Open. Original cause of wound was Gradually Appeared. The date acquired was: 01/21/2023. The wound has been in treatment 9 weeks. The wound is located on the Right Calcaneus. The wound measures 0.3cm length x 0.4cm width x 0.4cm depth; 0.094cm^2 area and 0.038cm^3 volume. There is Fat Layer (Subcutaneous Tissue) exposed. There is no tunneling noted, however, there is undermining starting at  3:00 and ending at 9:00 with a maximum distance of 0.6cm. There is a medium amount of serosanguineous drainage noted. The wound margin is distinct with the outline attached to the wound base. There is large (67-100%) red, pale granulation within the wound bed. There is  a small (1-33%) amount of necrotic tissue within the wound bed including Adherent Slough. The periwound skin appearance had no abnormalities noted for moisture. The periwound skin appearance had no abnormalities noted for color. The periwound skin appearance exhibited: Callus. Periwound temperature was noted as No Abnormality. 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 There was a Excisional Skin/Subcutaneous Tissue Debridement with a total area of 0.49 sq cm performed by Duanne Guess, MD. With the following instrument(s): Curette to remove Viable and Non-Viable tissue/material. Material removed includes Callus, Subcutaneous Tissue, Slough, and Skin: Epidermis after achieving pain control using Lidocaine 4% Topical Solution. No specimens were taken. A time out was conducted at 09:00, prior to the TYRESSE, MAKOWSKI ([id number]) [id number] Page 7 of 10 start of the procedure. A Minimum amount of bleeding was controlled with Pressure. The procedure was tolerated well with a pain level of 0 throughout and a pain level of 0 following the procedure. Post Debridement Measurements: 0.7cm length x 0.9cm width x 0.4cm depth; 0.198cm^3 volume. Character of Wound/Ulcer Post Debridement is improved. Severity of Tissue  Post Debridement is: Fat layer exposed. Post procedure Diagnosis Wound #1: Same as Pre-Procedure Pre-procedure diagnosis of Wound #1 is a Diabetic Wound/Ulcer of the Lower Extremity located on the Right Calcaneus . There was a T Contact Cast otal Procedure by Duanne Guess, MD. Post procedure Diagnosis Wound #1: Same as Pre-Procedure Plan Follow-up Appointments: Return Appointment in 1 week. - 04/29/2023 0915 Dr. Leanord Hawking Tuesday (already schedule) Anesthetic: Wound #1 Right Calcaneus: (In clinic) Topical Lidocaine 5% applied to wound bed (In clinic) Topical Lidocaine 4% applied to wound bed Bathing/ Shower/ Hygiene: May shower with protection but do not get wound dressing(s) wet. Protect dressing(s) with water repellant cover (for example, large plastic bag) or a cast cover and may then take shower. - purchase cast protector from [facility], [facility] or [employer] Off-Loading: T Contact Cast to Right Lower Extremity - size 3 otal WOUND #1: - Calcaneus Wound Laterality: Right Cleanser: Soap and Water 1 x Per Week/30 Days Discharge Instructions: May shower and wash wound with dial antibacterial soap and water prior to dressing change. Cleanser: Vashe 5.8 (oz) 1 x Per Week/30 Days Discharge Instructions: Cleanse the wound with Vashe prior to applying a clean dressing using gauze sponges, not tissue or cotton balls. Peri-Wound Care: Triamcinolone 15 (g) 1 x Per Week/30 Days Discharge Instructions: Use triamcinolone 15 (g) as directed Peri-Wound Care: Sween Lotion (Moisturizing lotion) 1 x Per Week/30 Days Discharge Instructions: Apply moisturizing lotion as directed Prim Dressing: Endoform 2x2 in 1 x Per Week/30 Days ary Discharge Instructions: Moisten with saline Secondary Dressing: ALLEVYN Heel 4 1/2in x 5 1/2in / 10.5cm x 13.5cm 1 x Per Week/30 Days Discharge Instructions: Apply over primary dressing as directed. ****AND TO TOES. Secondary Dressing: Optifoam Non-Adhesive Dressing, 4x4 in 1 x  Per Week/30 Days Discharge Instructions: Apply lateral side of foot Secondary Dressing: Woven Gauze Sponge, Non-Sterile 4x4 in 1 x Per Week/30 Days Discharge Instructions: Apply over primary dressing as directed. Secondary Dressing: T Contact Cast 1 x Per Week/30 Days otal Discharge Instructions: last layer applied by provider. 04/22/2023: There is a fair amount of  undermining created by some skin and callus. There is some slough on the surface. He does have a small abrasion on his anterior tibial surface and dorsal foot from his cast. I used a curette to debride slough, callus, subcutaneous tissue, and skin from his wound. I was able to remove most of the undermining, making the wound slightly larger as a result. We will continue endoform and total contact casting. Follow-up in 1 week. Electronic Signature(s) Signed: 04/22/2023 9:50:32 AM By: Duanne Guess MD FACS Entered By: Duanne Guess on 04/22/2023 09:50:32 -------------------------------------------------------------------------------- HxROS Details Patient Name: Date of Service: TO [name] NK W. 04/22/2023 8:15 A M Medical Record Number: [id number] Patient Account Number: [id number] Date of Birth/Sex: Treating RN: [birth year] (82 y.o. M) Primary Care Provider: Guerry Bruin Other Clinician: Referring Provider: Treating Provider/Extender: [name] in Treatment: 9 Constitutional Symptoms (General Health) Medical History: Past Medical History Notes: DHYEY, MCGEE ([id number]) [id number] Page 8 of 10 cellulitis left leg 10/2017 Eyes Medical History: Past Medical History Notes: cataract surgery about 77yrs ago Hematologic/Lymphatic Medical History: Positive for: Anemia Cardiovascular Medical History: Positive for: Arrhythmia - afib; Coronary Artery Disease; Hypertension; Peripheral Arterial Disease Past Medical History Notes: heart  murmur PVCs Gastrointestinal Medical History: Past Medical History Notes: gerd gallstones Endocrine Medical History: Positive for: Type II Diabetes Treated with: Insulin Blood sugar tested every day: Yes Tested : Genitourinary Medical History: Past Medical History Notes: CKD stage III benign prostatic hyperplasia Neurologic Medical History: Positive for: Neuropathy Psychiatric Medical History: Past Medical History Notes: depression Immunizations Pneumococcal Vaccine: Received Pneumococcal Vaccination: No Implantable Devices No devices added Hospitalization / Surgery History Type of Hospitalization/Surgery hysterectomy 07/14 cystoscopy 7/14 c-section 1993 cholecystectomy Family and Social History Cancer: Yes - Father,Maternal Grandparents; Heart Disease: Yes - Mother; Never smoker; Marital Status - Single; Alcohol Use: Never; Drug Use: No History; Caffeine Use: Never; Financial Concerns: No; Food, Clothing or Shelter Needs: No; Support System Lacking: No; Transportation Concerns: No Electronic Signature(s) Signed: 04/22/2023 10:29:44 AM By: Duanne Guess MD FACS Entered By: Duanne Guess on 04/22/2023 09:47:34 [name] ([id number]) [id number] Page 9 of 10 -------------------------------------------------------------------------------- Total Contact Cast Details Patient Name: Date of Service: TO [name] 04/22/2023 8:15 A M Medical Record Number: [id number] Patient Account Number: [id number] Date of Birth/Sex: Treating RN: August 07, 1940 (82 y.o. Damaris Schooner Primary Care Provider: Guerry Bruin Other Clinician: Referring Provider: Treating Provider/Extender: [name] in Treatment: 9 T Contact Cast Applied for Wound Assessment: otal Wound #1 Right Calcaneus Performed By: Physician Duanne Guess, MD The following information was scribed by: Zenaida Deed The information was  scribed for: Duanne Guess Post Procedure Diagnosis Same as Pre-procedure Electronic Signature(s) Signed: 04/22/2023 10:29:44 AM By: Duanne Guess MD FACS Signed: 04/22/2023 5:13:28 PM By: Zenaida Deed RN, BSN Entered By: Zenaida Deed on 04/22/2023 08:48:48 -------------------------------------------------------------------------------- SuperBill Details Patient Name: Date of Service: TO [name] 04/22/2023 Medical Record Number: [id number] Patient Account Number: [id number] Date of Birth/Sex: Treating RN: [birth year] (82 y.o. 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 E11.621 Type 2 diabetes mellitus with foot ulcer I87.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 Facility Procedures : CPT4 Code: 01093235 Description: 11042 - DEB SUBQ TISSUE 20 SQ CM/< ICD-10 Diagnosis Description L97.418  Non-pressure chronic ulcer of right heel and midfoot with other specified seve Modifier: rity Quantity: 1 Physician Procedures : CPT4 Code Description Modifier [id number] 11042 - WC PHYS SUBQ TISS 20 SQ CM ICD-10 Diagnosis Description 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

## 2023-04-23 NOTE — Progress Notes (Signed)
Erik [name], Erik [name] ([id number]) [id number] Page 1 of 8 Visit Report for 04/22/2023 Arrival Information Details Patient Name: Date of Service: TO Erik [name]. 04/22/2023 8:15 A M Medical Record Number: [id number] Patient Account Number: [id number] Date of Birth/Sex: Treating RN: [birth year] (82 y.o. M) Primary Care Erik [name]: Erik [name] Other Clinician: Referring Erik [name]: Treating Erik [name] in Treatment: 9 Visit Information History Since Last Visit Added or deleted any medications: No Patient Arrived: Walker Any new allergies or adverse reactions: No Arrival Time: [name]:31 Had a fall or experienced change in No Accompanied By: self activities of daily living that may affect Transfer Assistance: None risk of falls: Patient Identification Verified: Yes Signs or symptoms of abuse/neglect since last visito No Secondary Verification Process Completed: Yes Hospitalized since last visit: No Patient Requires Transmission-Based Precautions: No Implantable device outside of the clinic excluding No Patient Has Alerts: Yes cellular tissue based products placed in the center Patient Alerts: ABI: R 1.[name] 11/14/22 since last visit: Has Dressing in Place as Prescribed: Yes Has Footwear/Offloading in Place as Prescribed: Yes Right: T Contact Cast otal Pain Present Now: No Electronic Signature(s) Signed: 04/22/2023 5:13:28 PM By: Erik Deed RN, BSN Entered By: Erik [name] on 04/22/2023 [name]:33:59 -------------------------------------------------------------------------------- Encounter Discharge Information Details Patient Name: Date of Service: [name] 04/22/2023 8:15 A M Medical Record Number: [id number] Patient Account Number: [id number] Date of Birth/Sex: Treating RN: [birth year]Erik [name] (82 y.o. Erik [name] Primary Care Kaianna Dolezal: Erik [name] Other Clinician: Referring Erik [name]: Treating  Treshun Wold/Extender: Erik [name] in Treatment: 9 Encounter Discharge Information Items Post Procedure Vitals Discharge Condition: Stable Temperature (F): 97.9 Ambulatory Status: Walker Pulse (bpm): 61 Discharge Destination: Home Respiratory Rate (breaths/min): 18 Transportation: Private Auto Blood Pressure (mmHg): 125/48 Accompanied By: step daughter Schedule Follow-up Appointment: Yes Clinical Summary of Care: Patient Declined Electronic Signature(s) Signed: 04/22/2023 5:13:28 PM By: Erik Deed RN, BSN Entered By: Erik [name] on 04/22/2023 09:31:31 Erik [name] ([id number]) [phone].pdf Page 2 of 8 -------------------------------------------------------------------------------- Lower Extremity Assessment Details Patient Name: Date of Service: TO Erik [name]. 04/22/2023 8:15 A M Medical Record Number: [id number] Patient Account Number: [id number] Date of Birth/Sex: Treating RN: 30-Jan-Erik [name] (82 y.o. Erik [name] Primary Care Shakeria Robinette: Erik [name] Other Clinician: Referring [name]: Treating Tekeshia Klahr/Extender: Erik [name] in Treatment: 9 Edema Assessment Assessed: [Left: No] [Right: No] Edema: [Left: Ye] [Right: s] Calf Left: Right: Point of Measurement: From Medial Instep 37 cm Ankle Left: Right: Point of Measurement: From Medial Instep 25.5 cm Vascular Assessment Pulses: Dorsalis Pedis Palpable: [Right:Yes] Extremity colors, hair growth, and conditions: Extremity Color: [Right:Normal] Hair Growth on Extremity: [Right:No] Temperature of Extremity: [Right:Warm] Capillary Refill: [Right:< 3 seconds] Dependent Rubor: [Right:No Yes] Electronic Signature(s) Signed: 04/22/2023 5:13:28 PM By: Erik Deed RN, BSN Entered By: Erik [name] on 04/22/2023 [name]:40:18 -------------------------------------------------------------------------------- Multi Wound Chart  Details Patient Name: Date of Service: [name] 04/22/2023 8:15 A M Medical Record Number: [id number] Patient Account Number: [id number] Date of Birth/Sex: Treating RN: Erik [name][birth year] (82 y.o. M) Primary Care Chaunice Obie: Erik [name] Other Clinician: Referring Rayelle Armor: Treating Assata Juncaj/Extender: Erik [name] in Treatment: 9 Vital Signs Height(in): 71 Capillary Blood Glucose(mg/dl): 536 Weight(lbs): 144 Pulse(bpm): 61 Body Mass Index(BMI): 26.9 Blood Pressure(mmHg): 125/48 Temperature(F): 97.9 Respiratory Rate(breaths/min): 18 [1:Photos:] [N/A:N/A] Right Calcaneus N/A N/A Wound Location: Gradually Appeared N/A N/A Wounding Event: Diabetic Wound/Ulcer of the Lower N/A N/A Primary Etiology: Extremity Anemia, Arrhythmia, Coronary Artery  N/A N/A Comorbid History: Disease, Hypertension, Peripheral Arterial Disease, Type II Diabetes, Neuropathy 01/21/2023 N/A N/A Date Acquired: 9 N/A N/A Weeks of Treatment: Open N/A N/A Wound Status: No N/A N/A Wound Recurrence: 0.3x0.4x0.4 N/A N/A Measurements L x W x D (cm) 0.094 N/A N/A A (cm) : rea 0.038 N/A N/A Volume (cm) : 33.30% N/A N/A % Reduction in A rea: -35.70% N/A N/A % Reduction in Volume: 3 Starting Position 1 (o'clock): 9 Ending Position 1 (o'clock): 0.6 Maximum Distance 1 (cm): Yes N/A N/A Undermining: Grade 2 N/A N/A Classification: Medium N/A N/A Exudate A mount: Serosanguineous N/A N/A Exudate Type: red, brown N/A N/A Exudate Color: Distinct, outline attached N/A N/A Wound Margin: Large (67-100%) N/A N/A Granulation A mount: Red, Pale N/A N/A Granulation Quality: Small (1-33%) N/A N/A Necrotic A mount: Fat Layer (Subcutaneous Tissue): Yes N/A N/A Exposed Structures: Fascia: No Tendon: No Muscle: No Joint: No Bone: No Small (1-33%) N/A N/A Epithelialization: Debridement - Excisional N/A N/A Debridement: Pre-procedure Verification/Time Out 09:00 N/A  N/A Taken: Lidocaine 4% Topical Solution N/A N/A Pain Control: Callus, Subcutaneous, Slough N/A N/A Tissue Debrided: Skin/Subcutaneous Tissue N/A N/A Level: 0.49 N/A N/A Debridement A (sq cm): rea Curette N/A N/A Instrument: Minimum N/A N/A Bleeding: Pressure N/A N/A Hemostasis A chieved: 0 N/A N/A Procedural Pain: 0 N/A N/A Post Procedural Pain: Procedure was tolerated well N/A N/A Debridement Treatment Response: 0.7x0.9x0.4 N/A N/A Post Debridement Measurements L x W x D (cm) 0.198 N/A N/A Post Debridement Volume: (cm) Callus: Yes N/A N/A Periwound Skin Texture: No Abnormalities Noted N/A N/A Periwound Skin Moisture: No Abnormalities Noted N/A N/A Periwound Skin Color: No Abnormality N/A N/A Temperature: Debridement N/A N/A Procedures Performed: T Contact Cast otal Treatment Notes Wound #1 (Calcaneus) Wound Laterality: Right Cleanser Soap and Water Discharge Instruction: May shower and wash wound with dial antibacterial soap and water prior to dressing change. Vashe 5.8 (oz) Discharge Instruction: Cleanse the wound with Vashe prior to applying a clean dressing using gauze sponges, not tissue or cotton balls. Peri-Wound Care Triamcinolone 15 (g) Discharge Instruction: Use triamcinolone 15 (g) as directed Sween Lotion (Moisturizing lotion) Discharge Instruction: Apply moisturizing lotion as directed Topical JEYDEN, KONOPA ([id number]) [id number] Page 4 of 8 Primary Dressing Endoform 2x2 in Discharge Instruction: Moisten with saline Secondary Dressing ALLEVYN Heel 4 1/2in x 5 1/2in / 10.5cm x 13.5cm Discharge Instruction: Apply over primary dressing as directed. ****AND TO TOES. Optifoam Non-Adhesive Dressing, 4x4 in Discharge Instruction: Apply lateral side of foot Woven Gauze Sponge, Non-Sterile 4x4 in Discharge Instruction: Apply over primary dressing as directed. T Contact Cast otal Discharge Instruction: last layer  applied by Tonni Mansour. Secured With Compression Wrap Compression Stockings Facilities manager) Signed: 04/22/2023 9:46:41 AM By: Duanne Guess MD FACS Entered By: Duanne Guess on 04/22/2023 09:46:41 -------------------------------------------------------------------------------- Multi-Disciplinary Care Plan Details Patient Name: Date of Service: TO Erik Cross NK W. 04/22/2023 8:15 A M Medical Record Number: [id number] Patient Account Number: [id number] Date of Birth/Sex: Treating RN: [birth year] (82 y.o. Erik [name] Primary Care Huckleberry Martinson: Erik [name] Other Clinician: Referring Joshawa Dubin: Treating Lonie Newsham/Extender: Erik [name] in Treatment: 9 Multidisciplinary Care Plan reviewed with physician Active Inactive Wound/Skin Impairment Nursing Diagnoses: Impaired tissue integrity Knowledge deficit related to ulceration/compromised skin integrity Goals: Patient/caregiver will verbalize understanding of skin care regimen Date Initiated: 02/18/2023 Target Resolution Date: 05/23/2023 Goal Status: Active Ulcer/skin breakdown will have a volume reduction of 30% by week 4 Date Initiated: 02/18/2023 Target Resolution Date: 05/26/2023 Goal Status:  Active Interventions: Assess patient/caregiver ability to obtain necessary supplies Assess patient/caregiver ability to perform ulcer/skin care regimen upon admission and as needed Assess ulceration(s) every visit Provide education on ulcer and skin care Notes: Electronic Signature(s) Signed: 04/22/2023 5:13:28 PM By: Erik Deed RN, BSN Erik [name], Erik [name] [phone] By: Erik Deed RN, BSN [phone].pdf Page 5 of 8 Signed: 04/22/2023 5:13:28 Entered By: Erik [name] on 04/22/2023 [name]:28:50 -------------------------------------------------------------------------------- Pain Assessment Details Patient Name: Date of Service: TO Erik [name].  04/22/2023 8:15 A M Medical Record Number: [id number] Patient Account Number: [id number] Date of Birth/Sex: Treating RN: Erik [name], Erik [name] (82 y.o. M) Primary Care Hari Casaus: Erik [name] Other Clinician: Referring Deon Duer: Treating Cam Harnden/Extender: Erik [name] in Treatment: 9 Active Problems Location of Pain Severity and Description of Pain Patient Has Paino No Site Locations Rate the pain. Current Pain Level: 0 Pain Management and Medication Current Pain Management: Electronic Signature(s) Signed: 04/22/2023 5:13:28 PM By: Erik Deed RN, BSN Entered By: Erik [name] on 04/22/2023 [name]:34:39 -------------------------------------------------------------------------------- Patient/Caregiver Education Details Patient Name: Date of Service: TO Erik [name] 11/26/2024andnbsp8:15 A M Medical Record Number: [id number] Patient Account Number: [id number] Date of Birth/Gender: Treating RN: [birth year] Erik [name] (82 y.o. Erik [name] Primary Care Physician: Erik [name] Other Clinician: Referring Physician: Treating Physician/Extender: Erik [name] in Treatment: 9 Education Assessment Education Provided To: Patient Education Topics Provided Erik [name], Erik [name] ([id number]) [id number] Page 6 of 8 Offloading: Methods: Explain/Verbal Responses: Reinforcements needed, State content correctly Wound/Skin Impairment: Methods: Explain/Verbal Responses: Reinforcements needed, State content correctly Electronic Signature(s) Signed: 04/22/2023 5:13:28 PM By: Erik Deed RN, BSN Entered By: Erik [name] on 04/22/2023 [name]:29:13 -------------------------------------------------------------------------------- Wound Assessment Details Patient Name: Date of Service: [name] 04/22/2023 8:15 A M Medical Record Number: [id number] Patient Account Number: [id number] Date of Birth/Sex: Treating  RN: [birth year] (82 y.o. Erik [name] Primary Care Erik [name]: Erik [name] Other Clinician: Referring Oziel Beitler: Treating Erik [name]/Extender: Erik [name] in Treatment: 9 Wound Status Wound Number: 1 Primary Diabetic Wound/Ulcer of the Lower Extremity Etiology: Wound Location: Right Calcaneus Wound Open Wounding Event: Gradually Appeared Status: Date Acquired: 01/21/2023 Comorbid Anemia, Arrhythmia, Coronary Artery Disease, Hypertension, Weeks Of Treatment: 9 History: Peripheral Arterial Disease, Type II Diabetes, Neuropathy Clustered Wound: No Photos Wound Measurements Length: (cm) 0.3 Width: (cm) 0.4 Depth: (cm) 0.4 Area: (cm) 0.094 Volume: (cm) 0.038 % Reduction in Area: 33.3% % Reduction in Volume: -35.7% Epithelialization: Small (1-33%) Tunneling: No Undermining: Yes Starting Position (o'clock): 3 Ending Position (o'clock): 9 Maximum Distance: (cm) 0.6 Wound Description Classification: Grade 2 Wound Margin: Distinct, outline attached Exudate Amount: Medium Exudate Type: Serosanguineous Exudate Color: red, brown Foul Odor After Cleansing: No Slough/Fibrino Yes Wound Bed Granulation Amount: Large (67-100%) Exposed Structure Granulation Quality: Red, Pale Fascia Exposed: No Erik [name], Erik [name] ([id number]) [id number] Page 7 of 8 Necrotic Amount: Small (1-33%) Fat Layer (Subcutaneous Tissue) Exposed: Yes Necrotic Quality: Adherent Slough Tendon Exposed: No Muscle Exposed: No Joint Exposed: No Bone Exposed: No Periwound Skin Texture Texture Color No Abnormalities Noted: No No Abnormalities Noted: Yes Callus: Yes Temperature / Pain Temperature: No Abnormality Moisture No Abnormalities Noted: Yes Treatment Notes Wound #1 (Calcaneus) Wound Laterality: Right Cleanser Soap and Water Discharge Instruction: May shower and wash wound with dial antibacterial soap and water prior to dressing change. Vashe  5.8 (oz) Discharge Instruction: Cleanse the wound with Vashe prior to applying a clean dressing using gauze sponges, not tissue or cotton balls. Peri-Wound Care Triamcinolone 15 (g)  Discharge Instruction: Use triamcinolone 15 (g) as directed Sween Lotion (Moisturizing lotion) Discharge Instruction: Apply moisturizing lotion as directed Topical Primary Dressing Endoform 2x2 in Discharge Instruction: Moisten with saline Secondary Dressing ALLEVYN Heel 4 1/2in x 5 1/2in / 10.5cm x 13.5cm Discharge Instruction: Apply over primary dressing as directed. ****AND TO TOES. Optifoam Non-Adhesive Dressing, 4x4 in Discharge Instruction: Apply lateral side of foot Woven Gauze Sponge, Non-Sterile 4x4 in Discharge Instruction: Apply over primary dressing as directed. T Contact Cast otal Discharge Instruction: last layer applied by Erik [name]. Secured With Compression Wrap Compression Stockings Facilities manager) Signed: 04/22/2023 5:13:28 PM By: Erik Deed RN, BSN Entered By: Erik [name] on 04/22/2023 [name]:51:30 -------------------------------------------------------------------------------- Vitals Details Patient Name: Date of Service: TO Erik Cross NK W. 04/22/2023 8:15 A M Medical Record Number: [id number] Patient Account Number: [id number] Date of Birth/Sex: Treating RN: [birth year], Erik [name] (82 y.o. M) Primary Care Keefe Zawistowski: Erik [name] Other Clinician: Referring Erik [name]: Treating Ngai Parcell/Extender: Erik [name] in Treatment: [address] Robbie Louis ([id number]) [id number] Page 8 of 8 Vital Signs Time Taken: [name]:31 Temperature (F): 97.9 Height (in): 71 Pulse (bpm): 61 Weight (lbs): 193 Respiratory Rate (breaths/min): 18 Body Mass Index (BMI): 26.9 Blood Pressure (mmHg): 125/48 Capillary Blood Glucose (mg/dl): 742 Reference Range: 80 - 120 mg / dl Notes glucose per pt report this am Electronic Signature(s) Signed:  04/22/2023 5:13:28 PM By: Erik Deed RN, BSN Entered By: Erik [name] on 04/22/2023 [name]:34:30

## 2023-04-29 ENCOUNTER — Encounter (HOSPITAL_BASED_OUTPATIENT_CLINIC_OR_DEPARTMENT_OTHER): Payer: PPO | Attending: Internal Medicine | Admitting: Internal Medicine

## 2023-04-29 DIAGNOSIS — G500 Trigeminal neuralgia: Secondary | ICD-10-CM | POA: Diagnosis not present

## 2023-04-29 DIAGNOSIS — E7800 Pure hypercholesterolemia, unspecified: Secondary | ICD-10-CM | POA: Diagnosis not present

## 2023-04-29 DIAGNOSIS — L97418 Non-pressure chronic ulcer of right heel and midfoot with other specified severity: Secondary | ICD-10-CM | POA: Insufficient documentation

## 2023-04-29 DIAGNOSIS — L97412 Non-pressure chronic ulcer of right heel and midfoot with fat layer exposed: Secondary | ICD-10-CM | POA: Diagnosis not present

## 2023-04-29 DIAGNOSIS — E1151 Type 2 diabetes mellitus with diabetic peripheral angiopathy without gangrene: Secondary | ICD-10-CM | POA: Insufficient documentation

## 2023-04-29 DIAGNOSIS — E1140 Type 2 diabetes mellitus with diabetic neuropathy, unspecified: Secondary | ICD-10-CM | POA: Insufficient documentation

## 2023-04-29 DIAGNOSIS — E11621 Type 2 diabetes mellitus with foot ulcer: Secondary | ICD-10-CM | POA: Insufficient documentation

## 2023-04-29 DIAGNOSIS — I110 Hypertensive heart disease with heart failure: Secondary | ICD-10-CM | POA: Insufficient documentation

## 2023-04-29 DIAGNOSIS — I509 Heart failure, unspecified: Secondary | ICD-10-CM | POA: Diagnosis not present

## 2023-04-29 DIAGNOSIS — I480 Paroxysmal atrial fibrillation: Secondary | ICD-10-CM | POA: Insufficient documentation

## 2023-04-29 NOTE — Progress Notes (Addendum)
TESFA, OBRIANT ([id number]) [id number] Page 1 of 7 Visit Report for 04/29/2023 Arrival Information Details Patient Name: Date of Service: TO Erik [name]. 04/29/2023 9:15 A M Medical Record Number: [id number] Patient Account Number: [id number] Date of Birth/Sex: Treating RN: 03/24/1941 (82 y.o. Erik [name] Primary Care Daxon Kyne: Guerry Bruin Other Clinician: Referring Vinisha Faxon: Treating Eyva Califano/Extender: Wyline Beady in Treatment: 10 Visit Information History Since Last Visit Added or deleted any medications: No Patient Arrived: Ambulatory Any new allergies or adverse reactions: No Arrival Time: 15:26 Had a fall or experienced change in No Accompanied By: self activities of daily living that may affect Transfer Assistance: None risk of falls: Patient Identification Verified: Yes Signs or symptoms of abuse/neglect since last visito No Secondary Verification Process Completed: Yes Hospitalized since last visit: No Patient Requires Transmission-Based Precautions: No Implantable device outside of the clinic excluding No Patient Has Alerts: Yes cellular tissue based products placed in the center Patient Alerts: ABI: R 1.08 11/14/22 since last visit: Has Dressing in Place as Prescribed: Yes Has Footwear/Offloading in Place as Prescribed: Yes Right: T Contact Cast otal Pain Present Now: No Electronic Signature(s) Signed: 04/29/2023 4:06:44 PM By: Samuella Bruin Entered By: Samuella Bruin on 04/29/2023 15:27:00 -------------------------------------------------------------------------------- Encounter Discharge Information Details Patient Name: Date of Service: [name] 04/29/2023 9:15 A M Medical Record Number: [id number] Patient Account Number: [id number] Date of Birth/Sex: Treating RN: 27-Mar-1941 (82 y.o. Erik [name] Primary Care Azucena Dart: Guerry Bruin Other Clinician: Referring  Nichlas Pitera: Treating Dailee Manalang/Extender: Wyline Beady in Treatment: 10 Encounter Discharge Information Items Post Procedure Vitals Discharge Condition: Stable Temperature (F): 98.1 Ambulatory Status: Ambulatory Pulse (bpm): 64 Discharge Destination: Home Respiratory Rate (breaths/min): 16 Transportation: Private Auto Blood Pressure (mmHg): 135/68 Accompanied By: self Schedule Follow-up Appointment: Yes Clinical Summary of Care: Patient Declined Electronic Signature(s) Signed: 04/29/2023 4:06:44 PM By: Samuella Bruin Entered By: Samuella Bruin on 04/29/2023 15:31:10 Erik [name] ([id number]) [id number] Page 2 of 7 -------------------------------------------------------------------------------- Lower Extremity Assessment Details Patient Name: Date of Service: TO Erik [name]. 04/29/2023 9:15 A M Medical Record Number: [id number] Patient Account Number: [id number] Date of Birth/Sex: Treating RN: Jan 10, 1941 (82 y.o. Erik [name] Primary Care Pepper Kerrick: Guerry Bruin Other Clinician: Referring Sender Rueb: Treating Umeka Wrench/Extender: Wyline Beady in Treatment: 10 Edema Assessment Assessed: Erik [name]: No] Erik [name]: No] Edema: [Left: Ye] [Right: s] Calf Left: Right: Point of Measurement: From Medial Instep 39.5 cm Ankle Left: Right: Point of Measurement: From Medial Instep 25 cm Vascular Assessment Pulses: Dorsalis Pedis Palpable: [Right:Yes] Extremity colors, hair growth, and conditions: Extremity Color: [Right:Normal] Hair Growth on Extremity: [Right:No] Temperature of Extremity: [Right:Warm] Capillary Refill: [Right:< 3 seconds] Dependent Rubor: [Right:No Yes] Electronic Signature(s) Signed: 04/29/2023 4:06:44 PM By: Samuella Bruin Entered By: Samuella Bruin on 04/29/2023 15:27:32 -------------------------------------------------------------------------------- Multi Wound  Chart Details Patient Name: Date of Service: [name] 04/29/2023 9:15 A M Medical Record Number: [id number] Patient Account Number: [id number] Date of Birth/Sex: Treating RN: 29-Aug-1940 (82 y.o. M) Primary Care Shanvi Moyd: Guerry Bruin Other Clinician: Referring Chozen Latulippe: Treating Elecia Serafin/Extender: Wyline Beady in Treatment: 10 Vital Signs Height(in): 71 Pulse(bpm): 64 Weight(lbs): 193 Blood Pressure(mmHg): 135/68 Body Mass Index(BMI): 26.9 Temperature(F): 98.1 Respiratory Rate(breaths/min): 16 [1:Photos: No Photos Right Calcaneus Wound Location: Gradually Appeared Wounding Event: Diabetic Wound/Ulcer of the Lower Primary Etiology:] [N/A:N/A N/A N/A N/A] Erik [name], Erik [name] ([id number]) [1:Extremity Anemia, Arrhythmia, Coronary Artery N/A Comorbid History: Disease, Hypertension, Peripheral  Arterial Disease, Type II Diabetes, Neuropathy 01/21/2023 Date Acquired: 10 Weeks of Treatment: Open Wound Status: No Wound Recurrence: 0.3x0.5x0.3  Measurements L x W x D (cm) 0.118 A (cm) : rea 0.035 Volume (cm) : 16.30% % Reduction in A rea: -25.00% % Reduction in Volume: 12 Starting Position 1 (o'clock): 12 Ending Position 1 (o'clock): 0.4 Maximum Distance 1 (cm): Yes Undermining: Grade 2  Classification: Medium Exudate A mount: Serosanguineous Exudate Type: red, brown Exudate Color: Distinct, outline attached Wound Margin: Medium (34-66%) Granulation A mount: Pink, Pale Granulation Quality: Medium (34-66%) Necrotic A mount: Fat Layer  (Subcutaneous Tissue): Yes N/A Exposed Structures: Fascia: No Tendon: No Muscle: No Joint: No Bone: No Small (1-33%) Epithelialization: Debridement - Excisional Debridement: Pre-procedure Verification/Time Out 09:35 Taken: Lidocaine 4% T opical Solution  Pain Control: Subcutaneous Tissue Debrided: Skin/Subcutaneous Tissue Level: 0.24 Debridement A (sq cm): rea Curette Instrument: Moderate Bleeding: Silver Nitrate Hemostasis A chieved:  Procedure was tolerated well Debridement Treatment Response:  0.3x0.5x0.3 Post Debridement Measurements L x W x D (cm) 0.035 Post Debridement Volume: (cm) Callus: Yes Periwound Skin Texture: No Abnormalities Noted Periwound Skin Moisture: No Abnormalities Noted Periwound Skin Color: No Abnormality Temperature:  Debridement Procedures Performed: T Contact Cast otal] [N/A:N/A N/A N/A N/A N/A N/A N/A N/A N/A N/A N/A N/A N/A N/A N/A N/A N/A N/A N/A N/A N/A N/A N/A N/A N/A N/A N/A N/A N/A N/A N/A N/A N/A N/A N/A N/A] Treatment Notes Wound #1 (Calcaneus) Wound Laterality: Right Cleanser Soap and Water Discharge Instruction: May shower and wash wound with dial antibacterial soap and water prior to dressing change. Vashe 5.8 (oz) Discharge Instruction: Cleanse the wound with Vashe prior to applying a clean dressing using gauze sponges, not tissue or cotton balls. Peri-Wound Care Triamcinolone 15 (g) Discharge Instruction: Use triamcinolone 15 (g) as directed Sween Lotion (Moisturizing lotion) Discharge Instruction: Apply moisturizing lotion as directed Topical Primary Dressing Endoform 2x2 in Discharge Instruction: Moisten with saline Secondary Dressing ALLEVYN Heel 4 1/2in x 5 1/2in / 10.5cm x 13.5cm Discharge Instruction: Apply over primary dressing as directed. ****AND TO TOES. Optifoam Non-Adhesive Dressing, 4x4 in Discharge Instruction: Apply lateral side of foot Erik [name], Erik [name] ([id number]) [id number].pdf Page 4 of 7 Woven Gauze Sponge, Non-Sterile 4x4 in Discharge Instruction: Apply over primary dressing as directed. T Contact Cast otal Discharge Instruction: last layer applied by Moss Berry. Secured With Compression Wrap Compression Stockings Facilities manager) Signed: 05/04/2023 9:47:40 AM By: [name] MD Entered By: [name] on 05/02/2023  10:27:25 -------------------------------------------------------------------------------- Multi-Disciplinary Care Plan Details Patient Name: Date of Service: [name] 04/29/2023 9:15 A M Medical Record Number: [id number] Patient Account Number: [id number] Date of Birth/Sex: Treating RN: 1940/12/10 (82 y.o. Erik [name] Primary Care Viviano Bir: Guerry Bruin Other Clinician: Referring Victor Langenbach: Treating Kiaja Shorty/Extender: Wyline Beady in Treatment: 10 Multidisciplinary Care Plan reviewed with physician Active Inactive Wound/Skin Impairment Nursing Diagnoses: Impaired tissue integrity Knowledge deficit related to ulceration/compromised skin integrity Goals: Patient/caregiver will verbalize understanding of skin care regimen Date Initiated: 02/18/2023 Target Resolution Date: 05/23/2023 Goal Status: Active Ulcer/skin breakdown will have a volume reduction of 30% by week 4 Date Initiated: 02/18/2023 Target Resolution Date: 05/26/2023 Goal Status: Active Interventions: Assess patient/caregiver ability to obtain necessary supplies Assess patient/caregiver ability to perform ulcer/skin care regimen upon admission and as needed Assess ulceration(s) every visit Provide education on ulcer and skin care Notes: Electronic Signature(s) Signed: 04/29/2023 4:06:44 PM By: Samuella Bruin Entered By: Samuella Bruin on 04/29/2023 15:30:00  Erik [name], Erik [name] ([id number]) [id number] Page 5 of 7 -------------------------------------------------------------------------------- Pain Assessment Details Patient Name: Date of Service: TO Erik [name]. 04/29/2023 9:15 A M Medical Record Number: [id number] Patient Account Number: [id number] Date of Birth/Sex: Treating RN: July 03, 1940 (82 y.o. Erik [name] Primary Care Yahya Boldman: Guerry Bruin Other Clinician: Referring Keimora Swartout: Treating Laken Lobato/Extender: Wyline Beady in Treatment: 10 Active Problems Location of Pain Severity and Description of Pain Patient Has Paino No Site Locations Rate the pain. Current Pain Level: 0 Pain Management and Medication Current Pain Management: Electronic Signature(s) Signed: 04/29/2023 4:06:44 PM By: Samuella Bruin Entered By: Samuella Bruin on 04/29/2023 15:27:20 -------------------------------------------------------------------------------- Patient/Caregiver Education Details Patient Name: Date of Service: TO Erik [name]. 12/3/2024andnbsp9:15 A M Medical Record Number: [id number] Patient Account Number: [id number] Date of Birth/Gender: Treating RN: 1941-04-19 (82 y.o. Erik [name] Primary Care Physician: Guerry Bruin Other Clinician: Referring Physician: Treating Physician/Extender: Wyline Beady in Treatment: 10 Education Assessment Education Provided To: Patient Education Topics Provided Wound/Skin Impairment: Methods: Explain/Verbal Responses: Reinforcements needed, State content correctly Erik [name]-[facility] [name]) Signed: 04/29/2023 4:06:44 PM By: Samuella Bruin Entered By: Samuella Bruin on 04/29/2023 15:30:13 Erik [name] ([id number]) [phone].pdf Page 6 of 7 -------------------------------------------------------------------------------- Wound Assessment Details Patient Name: Date of Service: TO Erik [name]. 04/29/2023 9:15 A M Medical Record Number: [id number] Patient Account Number: [id number] Date of Birth/Sex: Treating RN: 10/06/1940 (82 y.o. Erik [name] Primary Care Joe Tanney: Guerry Bruin Other Clinician: Referring Marajade Lei: Treating Moya Duan/Extender: Wyline Beady in Treatment: 10 Wound Status Wound Number: 1 Primary Diabetic Wound/Ulcer of the Lower Extremity Etiology: Wound Location: Right Calcaneus Wound Open Wounding Event:  Gradually Appeared Status: Date Acquired: 01/21/2023 Comorbid Anemia, Arrhythmia, Coronary Artery Disease, Hypertension, Weeks Of Treatment: 10 History: Peripheral Arterial Disease, Type II Diabetes, Neuropathy Clustered Wound: No Wound Measurements Length: (cm) 0.3 Width: (cm) 0.5 Depth: (cm) 0.3 Area: (cm) 0.118 Volume: (cm) 0.035 % Reduction in Area: 16.3% % Reduction in Volume: -25% Epithelialization: Small (1-33%) Tunneling: No Undermining: Yes Starting Position (o'clock): 12 Ending Position (o'clock): 12 Maximum Distance: (cm) 0.4 Wound Description Classification: Grade 2 Wound Margin: Distinct, outline attached Exudate Amount: Medium Exudate Type: Serosanguineous Exudate Color: red, brown Foul Odor After Cleansing: No Slough/Fibrino Yes Wound Bed Granulation Amount: Medium (34-66%) Exposed Structure Granulation Quality: Pink, Pale Fascia Exposed: No Necrotic Amount: Medium (34-66%) Fat Layer (Subcutaneous Tissue) Exposed: Yes Necrotic Quality: Adherent Slough Tendon Exposed: No Muscle Exposed: No Joint Exposed: No Bone Exposed: No Periwound Skin Texture Texture Color No Abnormalities Noted: No No Abnormalities Noted: Yes Callus: Yes Temperature / Pain Temperature: No Abnormality Moisture No Abnormalities Noted: Yes Treatment Notes Wound #1 (Calcaneus) Wound Laterality: Right Cleanser Soap and Water Discharge Instruction: May shower and wash wound with dial antibacterial soap and water prior to dressing change. Vashe 5.8 (oz) Discharge Instruction: Cleanse the wound with Vashe prior to applying a clean dressing using gauze sponges, not tissue or cotton balls. Peri-Wound Care Triamcinolone 15 (g) Discharge Instruction: Use triamcinolone 15 (g) as directed [employer] (Moisturizing lotion) Erik [name], Erik [name] ([id number]) [id number] Page 7 of 7 Discharge Instruction: Apply moisturizing lotion as directed Topical Primary  Dressing Endoform 2x2 in Discharge Instruction: Moisten with saline Secondary Dressing ALLEVYN Heel 4 1/2in x 5 1/2in / 10.5cm x 13.5cm Discharge Instruction: Apply over primary dressing as directed. ****AND TO TOES. Optifoam Non-Adhesive Dressing, 4x4 in Discharge Instruction: Apply lateral side of foot Woven Gauze Sponge, Non-Sterile 4x4 in  Discharge Instruction: Apply over primary dressing as directed. T Contact Cast otal Discharge Instruction: last layer applied by Erik [name]. Secured With Compression Wrap Compression Stockings Facilities manager) Signed: 04/29/2023 4:06:44 PM By: Samuella Bruin Entered By: Samuella Bruin on 04/29/2023 15:28:07 -------------------------------------------------------------------------------- Vitals Details Patient Name: Date of Service: [name] 04/29/2023 9:15 A M Medical Record Number: [id number] Patient Account Number: [id number] Date of Birth/Sex: Treating RN: July 05, 1940 (82 y.o. Erik [name] Primary Care Loney Domingo: Guerry Bruin Other Clinician: Referring Juno Alers: Treating Mashayla Lavin/Extender: Wyline Beady in Treatment: 10 Vital Signs Time Taken: 09:13 Temperature (F): 98.1 Height (in): 71 Pulse (bpm): 64 Weight (lbs): 193 Respiratory Rate (breaths/min): 16 Body Mass Index (BMI): 26.9 Blood Pressure (mmHg): 135/68 Reference Range: 80 - 120 mg / dl Electronic Signature(s) Signed: 04/29/2023 4:06:44 PM By: Samuella Bruin Entered By: Samuella Bruin on 04/29/2023 15:27:14

## 2023-05-03 ENCOUNTER — Encounter (HOSPITAL_BASED_OUTPATIENT_CLINIC_OR_DEPARTMENT_OTHER): Payer: PPO | Admitting: Internal Medicine

## 2023-05-04 NOTE — Progress Notes (Signed)
Erik [name], Erik [name] ([id number]) [id number] Page 1 of 7 Visit Report for 04/29/2023 Debridement Details Patient Name: Date of Service: TO Erik [name]. 04/29/2023 9:15 A M Medical Record Number: [id number] Patient Account Number: [id number] Date of Birth/Sex: Treating RN: [birth year] (82 y.o. M) Primary Care Provider: Guerry [name] Other Clinician: Referring Provider: Treating Provider/Extender: Erik [name] in Treatment: 10 Debridement Performed for Assessment: Wound #1 Right Calcaneus Performed By: Physician Erik Caul., MD The following information was scribed by: Erik [name] The information was scribed for: Erik [name] Debridement Type: Debridement Severity of Tissue Pre Debridement: Fat layer exposed Level of Consciousness (Pre-procedure): Awake and Alert Pre-procedure Verification/Time Out Yes - 09:35 Taken: Start Time: 09:35 Pain Control: Lidocaine 4% T opical Solution Percent of Wound Bed Debrided: 200% T Area Debrided (cm): otal 0.24 Tissue and other material debrided: Non-Viable, Subcutaneous, Skin: Epidermis Level: Skin/Subcutaneous Tissue Debridement Description: Excisional Instrument: Curette Bleeding: Moderate Hemostasis Achieved: Silver Nitrate Response to Treatment: Procedure was tolerated well Level of Consciousness (Post- Awake and Alert procedure): Post Debridement Measurements of Total Wound Length: (cm) 0.3 Width: (cm) 0.5 Depth: (cm) 0.3 Volume: (cm) 0.035 Character of Wound/Ulcer Post Debridement: Improved Severity of Tissue Post Debridement: Fat layer exposed Post Procedure Diagnosis Same as Pre-procedure Electronic Signature(s) Signed: 05/04/2023 9:47:40 AM By: Erik Najjar MD Previous Signature: 04/29/2023 4:06:44 PM Version By: Erik [name] Entered By: Erik [name] on 05/02/2023  05:25:00 -------------------------------------------------------------------------------- HPI Details Patient Name: Date of Service: [name] 04/29/2023 9:15 A M Medical Record Number: [id number] Patient Account Number: [id number] Date of Birth/Sex: Treating RN: 1940-12-17 (82 y.o. M) Primary Care Provider: Guerry [name] Other Clinician: Referring Provider: Treating Provider/Extender: Erik [name] in Treatment: [address] ([id number]) [id number] Page 2 of 7 History of Present Illness HPI Description: ADMISSION 02/18/2023 This is an 82 year old still very active man who was referred to [place] by Erik [name] at [facility]. He has a nonhealing wound on the plantar aspect of his right heel. He has been dealing with this since June when he presented to [name] with a painful area on the right mid heel. He was not really sure how this happened. He ultimately had an MRI in July that showed no osteomyelitis but there was a small abscess he was treated with antibiotics doxycycline and gradually the pain improved but he still been left with a small open area. He had an ABI on 11/14/2022 that was 1.08 on the right and 0.99 on the left. He by enlarge has been using Silvadene on the wound. Recently changed to silver alginate. He also developed a blister on the right anterior lower leg. Recent x-rays have not shown any cortical changes. Also in June he had a sedimentation rate of 2 and a C-reactive protein of 14 not suggestive of chronic active infection Past medical history includes type 2 diabetes, nonischemic cardiomyopathy, hypertension cholesterolemia, skin cancer, combined congestive heart failure, paroxysmal A-fib, idiopathic trigeminal neuralgia. 10/1; ulcer on the plantar aspect of his right heel. He is being compliant with the healing boot. We have been using Hydrofera Blue heel cup gauze. Dimensions are better 10/7;  the area on the leg right anterior is healed. He has chronic insufficiency. Unfortunately the ulcer on the plantar aspect of his right heel looks about the same each time with undermining the skin is not adherent to the underlying tissue. This requires debridement. We have been using Hydrofera Blue, heel cup and heel  offloading shoe. We are not making progress with the heel ulcer. 10/15; I brought the patient in today with the intention of putting him in a total contact cast if the wound on his plantar heel on the right was no better. However he comes in with erythema spreading towards the tip of his heel. There is also bogginess. There is no crepitus. He is a very active man. He is in the heel off loader but I do not think for 1 reason or another this is doing the job in terms of pressure and friction relief. I am concerned enough about the wound today not to put on a total contact cast 10/22; culture I did last week showed Staphylococcus epidermidis which is likely a skin contaminant but he also grew Staphylococcus capitis which was resistant to the doxycycline I put him on. I am going to give him a week of trimethoprim/sulfamethoxazole for this. I am not sure of the relevance of the coag negative staph however I do not want to put a cast and something that is infected. We have been using Iodoflex 10/29; patient with a right plantar heel wound. This was initially probably infectious with an MRI showing a small abscess but no osteomyelitis. 2 weeks ago he had erythema around the wound and bogginess but no crepitus I treated him with a prolonged course of antibiotics last week giving and will further week of Septra. We have been using mupirocin and silver alginate. The wound is measuring smaller today but there is undermining at 0.9 cm almost circumferentially 11/7; right plantar heel. This was not any smaller this week with considerable relative undermining almost circumferentially. This required  debridement. I wanted to put this in a total contact cast but the patient was in a sling for a gene that we would not have been able to get off over the cast. 11/18; small wound with significant relative depth. We have been using endoform and we will put a total contact cast on him for the first time today. 11/20; in for the first obligatory total contact cast we have been using endoform small punched-out area on the right plantar heel seems stable to improved 04/22/2023: There is a fair amount of undermining created by some skin and callus. There is some slough on the surface. He does have a small abrasion on his anterior tibial surface and dorsal foot from his cast. 12/3; wound on the right plantar heel. Again he comes in with undermining this is not a new phenomenon. We've been using end form and a TCC. Electronic Signature(s) Signed: 05/04/2023 9:47:40 AM By: Erik Najjar MD Entered By: Erik [name] on 05/02/2023 05:27:01 -------------------------------------------------------------------------------- Physical Exam Details Patient Name: Date of Service: [name] 04/29/2023 9:15 A M Medical Record Number: [id number] Patient Account Number: [id number] Date of Birth/Sex: Treating RN: Oct 06, 1940 (82 y.o. M) Primary Care Provider: Guerry [name] Other Clinician: Referring Provider: Treating Provider/Extender: Erik [name] in Treatment: 10 Constitutional Sitting or standing Blood Pressure is within target range for patient.. Pulse regular and within target range for patient.[name] Respirations regular, non-labored and within target range.. Temperature is normal and within the target range for the patient.[name] Appears in no distress. Notes Wound exam; still undermining of skin and callous around the wound on the plantar heel. This was debrided using a number three curette hemostasis with silver nitrate. No evidence of surrounding affection. This does not probe  deeply Electronic Signature(s) Signed: 05/04/2023 9:47:40 AM By: Erik Najjar  MD Entered By: Erik [name] on 05/02/2023 05:30:03 Erik [name] ([id number]) [id number] Page 3 of 7 -------------------------------------------------------------------------------- Physician Orders Details Patient Name: Date of Service: TO Erik [name]. 04/29/2023 9:15 A M Medical Record Number: [id number] Patient Account Number: [id number] Date of Birth/Sex: Treating RN: Oct 20, 1940 (82 y.o. Marlan Palau Primary Care Provider: Guerry [name] Other Clinician: Referring Provider: Treating Provider/Extender: Erik [name] in Treatment: 10 The following information was scribed by: Erik [name] The information was scribed for: Erik [name] Verbal / Phone Orders: No Diagnosis Coding Follow-up Appointments ppointment in 1 week. - Dr. Leanord Hawking Return A Anesthetic Wound #1 Right Calcaneus (In clinic) Topical Lidocaine 4% applied to wound bed Bathing/ Shower/ Hygiene May shower with protection but do not get wound dressing(s) wet. Protect dressing(s) with water repellant cover (for example, large plastic bag) or a cast cover and may then take shower. - purchase cast protector from [facility], [facility] or [employer] Off-Loading Total Contact Cast to Right Lower Extremity - size 3 Wound Treatment Wound #1 - Calcaneus Wound Laterality: Right Cleanser: Soap and Water 1 x Per Week/30 Days Discharge Instructions: May shower and wash wound with dial antibacterial soap and water prior to dressing change. Cleanser: Vashe 5.8 (oz) 1 x Per Week/30 Days Discharge Instructions: Cleanse the wound with Vashe prior to applying a clean dressing using gauze sponges, not tissue or cotton balls. Peri-Wound Care: Triamcinolone 15 (g) 1 x Per Week/30 Days Discharge Instructions: Use triamcinolone 15 (g) as directed Peri-Wound Care: Sween Lotion (Moisturizing  lotion) 1 x Per Week/30 Days Discharge Instructions: Apply moisturizing lotion as directed Prim Dressing: Endoform 2x2 in 1 x Per Week/30 Days ary Discharge Instructions: Moisten with saline Secondary Dressing: ALLEVYN Heel 4 1/2in x 5 1/2in / 10.5cm x 13.5cm 1 x Per Week/30 Days Discharge Instructions: Apply over primary dressing as directed. ****AND TO TOES. Secondary Dressing: Optifoam Non-Adhesive Dressing, 4x4 in 1 x Per Week/30 Days Discharge Instructions: Apply lateral side of foot Secondary Dressing: Woven Gauze Sponge, Non-Sterile 4x4 in 1 x Per Week/30 Days Discharge Instructions: Apply over primary dressing as directed. Secondary Dressing: T Contact Cast 1 x Per Week/30 Days otal Discharge Instructions: last layer applied by provider. Patient Medications llergies: No Known Drug Allergies A Notifications Medication Indication Start End 04/29/2023 lidocaine DOSE topical 4 % cream - cream topical Erik [name], Erik [name] ([id number]) [id number] Page 4 of 7 Electronic Signature(s) Signed: 04/29/2023 4:06:44 PM By: Erik [name] Signed: 05/04/2023 9:47:40 AM By: Erik Najjar MD Entered By: Erik [name] on 04/29/2023 15:29:53 -------------------------------------------------------------------------------- Problem List Details Patient Name: Date of Service: [name] 04/29/2023 9:15 A M Medical Record Number: [id number] Patient Account Number: [id number] Date of Birth/Sex: Treating RN: [birth year] (82 y.o. M) Primary Care Provider: Guerry [name] Other Clinician: Referring Provider: Treating Provider/Extender: Erik [name] in Treatment: 10 Active Problems ICD-10 Encounter Code Description Active Date MDM Diagnosis 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 02/18/2023 No Yes severity E11.621 Type 2 diabetes mellitus with foot ulcer 02/18/2023 No Yes I87.331 Chronic  venous hypertension (idiopathic) with ulcer and inflammation of right 02/18/2023 No Yes lower extremity Inactive Problems ICD-10 Code Description Active Date Inactive Date L97.811 Non-pressure chronic ulcer of other part of right lower leg limited to breakdown of skin 02/18/2023 02/18/2023 Resolved Problems Electronic Signature(s) Signed: 05/04/2023 9:47:40 AM By: Erik Najjar MD Entered By: Erik [name] on 05/02/2023 05:23:48 -------------------------------------------------------------------------------- Progress Note Details Patient Name: Date of Service:  TO Erik Cross NK W. 04/29/2023 9:15 A M Medical Record Number: [id number] Patient Account Number: [id number] Date of Birth/Sex: Treating RN: [birth year] (82 y.o. M) Primary Care Provider: Guerry [name] Other Clinician: Referring Provider: Treating Provider/Extender: Erik [name] in Treatment: [address] [place], [place] Robbie Louis ([id number]) [id number] Page 5 of 7 History of Present Illness (HPI) ADMISSION 02/18/2023 This is an 82 year old still very active man who was referred to [place] by Erik [name] at [facility]. He has a nonhealing wound on the plantar aspect of his right heel. He has been dealing with this since June when he presented to [name] with a painful area on the right mid heel. He was not really sure how this happened. He ultimately had an MRI in July that showed no osteomyelitis but there was a small abscess he was treated with antibiotics doxycycline and gradually the pain improved but he still been left with a small open area. He had an ABI on 11/14/2022 that was 1.08 on the right and 0.99 on the left. He by enlarge has been using Silvadene on the wound. Recently changed to silver alginate. He also developed a blister on the right anterior lower leg. Recent x-rays have not shown any cortical changes. Also in June he had a sedimentation rate of 2 and a  C-reactive protein of 14 not suggestive of chronic active infection Past medical history includes type 2 diabetes, nonischemic cardiomyopathy, hypertension cholesterolemia, skin cancer, combined congestive heart failure, paroxysmal A-fib, idiopathic trigeminal neuralgia. 10/1; ulcer on the plantar aspect of his right heel. He is being compliant with the healing boot. We have been using Hydrofera Blue heel cup gauze. Dimensions are better 10/7; the area on the leg right anterior is healed. He has chronic insufficiency. Unfortunately the ulcer on the plantar aspect of his right heel looks about the same each time with undermining the skin is not adherent to the underlying tissue. This requires debridement. We have been using Hydrofera Blue, heel cup and heel offloading shoe. We are not making progress with the heel ulcer. 10/15; I brought the patient in today with the intention of putting him in a total contact cast if the wound on his plantar heel on the right was no better. However he comes in with erythema spreading towards the tip of his heel. There is also bogginess. There is no crepitus. He is a very active man. He is in the heel off loader but I do not think for 1 reason or another this is doing the job in terms of pressure and friction relief. I am concerned enough about the wound today not to put on a total contact cast 10/22; culture I did last week showed Staphylococcus epidermidis which is likely a skin contaminant but he also grew Staphylococcus capitis which was resistant to the doxycycline I put him on. I am going to give him a week of trimethoprim/sulfamethoxazole for this. I am not sure of the relevance of the coag negative staph however I do not want to put a cast and something that is infected. We have been using Iodoflex 10/29; patient with a right plantar heel wound. This was initially probably infectious with an MRI showing a small abscess but no osteomyelitis. 2 weeks ago he had  erythema around the wound and bogginess but no crepitus I treated him with a prolonged course of antibiotics last week giving and will further week of Septra. We have been using mupirocin and silver alginate. The wound  is measuring smaller today but there is undermining at 0.9 cm almost circumferentially 11/7; right plantar heel. This was not any smaller this week with considerable relative undermining almost circumferentially. This required debridement. I wanted to put this in a total contact cast but the patient was in a sling for a gene that we would not have been able to get off over the cast. 11/18; small wound with significant relative depth. We have been using endoform and we will put a total contact cast on him for the first time today. 11/20; in for the first obligatory total contact cast we have been using endoform small punched-out area on the right plantar heel seems stable to improved 04/22/2023: There is a fair amount of undermining created by some skin and callus. There is some slough on the surface. He does have a small abrasion on his anterior tibial surface and dorsal foot from his cast. 12/3; wound on the right plantar heel. Again he comes in with undermining this is not a new phenomenon. We've been using end form and a TCC. Objective Constitutional Sitting or standing Blood Pressure is within target range for patient.. Pulse regular and within target range for patient.[name] Respirations regular, non-labored and within target range.. Temperature is normal and within the target range for the patient.[name] Appears in no distress. Vitals Time Taken: 9:13 AM, Height: 71 in, Weight: 193 lbs, BMI: 26.9, Temperature: 98.1 F, Pulse: 64 bpm, Respiratory Rate: 16 breaths/min, Blood Pressure: 135/68 mmHg. General Notes: Wound exam; still undermining of skin and callous around the wound on the plantar heel. This was debrided using a number three curette hemostasis with silver nitrate. No evidence of  surrounding affection. This does not probe deeply Integumentary (Hair, Skin) Wound #1 status is Open. Original cause of wound was Gradually Appeared. The date acquired was: 01/21/2023. The wound has been in treatment 10 weeks. The wound is located on the Right Calcaneus. The wound measures 0.3cm length x 0.5cm width x 0.3cm depth; 0.118cm^2 area and 0.035cm^3 volume. There is Fat Layer (Subcutaneous Tissue) exposed. There is no tunneling noted, however, there is undermining starting at 12:00 and ending at 12:00 with a maximum distance of 0.4cm. There is a medium amount of serosanguineous drainage noted. The wound margin is distinct with the outline attached to the wound base. There is medium (34-66%) pink, pale granulation within the wound bed. There is a medium (34-66%) amount of necrotic tissue within the wound bed including Adherent Slough. The periwound skin appearance had no abnormalities noted for moisture. The periwound skin appearance had no abnormalities noted for color. The periwound skin appearance exhibited: Callus. Periwound temperature was noted as No Abnormality. 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 There was a Excisional Skin/Subcutaneous Tissue Debridement with a total area of 0.24 sq cm performed by Erik Caul., MD. With the following instrument(s): Curette to remove Non-Viable tissue/material. Material removed includes Subcutaneous Tissue and Skin: Epidermis and after achieving pain  control using Lidocaine 4% T opical Solution. No specimens were taken. A time out was conducted at 09:35, prior to the start of the procedure. A  Moderate amount of bleeding was controlled with Silver Nitrate. The procedure was tolerated well. Post Debridement Measurements: 0.3cm length x 0.5cm width x 0.3cm depth; 0.035cm^3 volume. Character of Wound/Ulcer Post Debridement is improved. Severity of Tissue Post Debridement is: Fat layer exposed. Post procedure Diagnosis Wound #1: Same as Pre-Procedure Pre-procedure diagnosis of Wound #1 is a Diabetic Wound/Ulcer of the Lower Extremity located on the Right Calcaneus . There was a T Contact Cast otal Procedure by Erik Caul., MD. Post procedure Diagnosis Wound #1: Same as Pre-Procedure Plan Follow-up Appointments: Return Appointment in 1 week. - Dr. Leanord Hawking Anesthetic: Wound #1 Right Calcaneus: (In clinic) Topical Lidocaine 4% applied to wound bed Bathing/ Shower/ Hygiene: May shower with protection but do not get wound dressing(s) wet. Protect dressing(s) with water repellant cover (for example, large plastic bag) or a cast cover and may then take shower. - purchase cast protector from [facility], [facility] or [employer] Off-Loading: T Contact Cast to Right Lower Extremity - size 3 otal The following medication(s) was prescribed: lidocaine topical 4 % cream cream topical was prescribed at facility WOUND #1: - Calcaneus Wound Laterality: Right Cleanser: Soap and Water 1 x Per Week/30 Days Discharge Instructions: May shower and wash wound with dial antibacterial soap and water prior to dressing change. Cleanser: Vashe 5.8 (oz) 1 x Per Week/30 Days Discharge Instructions: Cleanse the wound with Vashe prior to applying a clean dressing using gauze sponges, not tissue or cotton balls. Peri-Wound Care: Triamcinolone 15 (g) 1 x Per Week/30 Days Discharge Instructions: Use triamcinolone 15 (g) as directed Peri-Wound Care: Sween Lotion (Moisturizing  lotion) 1 x Per Week/30 Days Discharge Instructions: Apply moisturizing lotion as directed Prim Dressing: Endoform 2x2 in 1 x Per Week/30 Days ary Discharge Instructions: Moisten with saline Secondary Dressing: ALLEVYN Heel 4 1/2in x 5 1/2in / 10.5cm x 13.5cm 1 x Per Week/30 Days Discharge Instructions: Apply over primary dressing as directed. ****AND TO TOES. Secondary Dressing: Optifoam Non-Adhesive Dressing, 4x4 in 1 x Per Week/30 Days Discharge Instructions: Apply lateral side of foot Secondary Dressing: Woven Gauze Sponge, Non-Sterile 4x4 in 1 x Per Week/30 Days Discharge Instructions: Apply over primary dressing as directed. Secondary Dressing: T Contact Cast 1 x Per Week/30 Days otal Discharge Instructions: last layer applied by provider. 1 continued with end of form as a primary dressing, leave and heal under a total contact cast. 2 attempt to pad this area more under the cast. He continues to have undermining area Electronic Signature(s) Signed: 05/04/2023 9:47:40 AM By: Erik Najjar MD Entered By: Erik [name] on 05/02/2023 05:32:15 -------------------------------------------------------------------------------- Total Contact Cast Details Patient Name: Date of Service: [name] 04/29/2023 9:15 A Erik [name] ([id number]) [id number] Page 7 of 7 Medical Record Number: [id number] Patient Account Number: [id number] Date of Birth/Sex: Treating RN: Mar 03, 1941 (82 y.o. M) Primary Care Provider: Guerry [name] Other Clinician: Referring Provider: Treating Provider/Extender: Erik [name] in Treatment: 10 T Contact Cast Applied for Wound Assessment: otal Wound #1 Right Calcaneus Performed By: Physician Erik Caul., MD The following information was scribed by: Erik [name] The information was scribed for: Erik [name] Post Procedure Diagnosis Same as Pre-procedure Electronic  Signature(s) Signed: 05/04/2023 9:47:40 AM By: Erik Najjar MD Previous Signature: 04/29/2023 4:06:44 PM Version By: Erik [name] Entered By: Erik [name] on 05/02/2023 05:25:43 -------------------------------------------------------------------------------- SuperBill Details Patient Name: Date of Service: [name] 04/29/2023 Medical Record Number: [id number] Patient Account Number: [id number] Date of Birth/Sex: Treating  RN: [birth year] (82 y.o. 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

## 2023-05-06 ENCOUNTER — Encounter (HOSPITAL_BASED_OUTPATIENT_CLINIC_OR_DEPARTMENT_OTHER): Payer: PPO | Admitting: Internal Medicine

## 2023-05-06 DIAGNOSIS — L97412 Non-pressure chronic ulcer of right heel and midfoot with fat layer exposed: Secondary | ICD-10-CM | POA: Diagnosis not present

## 2023-05-06 DIAGNOSIS — E11621 Type 2 diabetes mellitus with foot ulcer: Secondary | ICD-10-CM | POA: Diagnosis not present

## 2023-05-07 NOTE — Progress Notes (Signed)
Erik [name], Erik [name] ([id number]) [id number] Page 1 of 7 Visit Report for 05/06/2023 Debridement Details Patient Name: Date of Service: TO Erik [name]. 05/06/2023 11:15 A M Medical Record Number: [id number] Patient Account Number: [id number] Date of Birth/Sex: Treating RN: [birth year] (82 y.o. M) Primary Care Provider: Guerry Bruin Other Clinician: Referring Provider: Treating Provider/Extender: Wyline Beady in Treatment: 11 Debridement Performed for Assessment: Wound #1 Right Calcaneus Performed By: Physician Maxwell Caul., MD The following information was scribed by: Karie Schwalbe The information was scribed for: [name] Debridement Type: Debridement Severity of Tissue Pre Debridement: Fat layer exposed Level of Consciousness (Pre-procedure): Awake and Alert Pre-procedure Verification/Time Out Yes - 11:45 Taken: Start Time: 11:45 Pain Control: Lidocaine 4% T opical Solution Percent of Wound Bed Debrided: 100% T Area Debrided (cm): otal 0.78 Tissue and other material debrided: Viable, Non-Viable, Subcutaneous, Skin: Dermis , Skin: Epidermis Level: Skin/Subcutaneous Tissue Debridement Description: Excisional Instrument: Blade, Curette, Forceps, Scissors Bleeding: Minimum Hemostasis Achieved: Silver Nitrate Response to Treatment: Procedure was tolerated well Level of Consciousness (Post- Awake and Alert procedure): Post Debridement Measurements of Total Wound Length: (cm) 1 Width: (cm) 1 Depth: (cm) 0.3 Volume: (cm) 0.236 Character of Wound/Ulcer Post Debridement: Improved Severity of Tissue Post Debridement: Fat layer exposed Post Procedure Diagnosis Same as Pre-procedure Electronic Signature(s) Signed: 05/07/2023 10:03:28 AM By: [name] MD Entered By: [name] on 05/06/2023 09:34:59 -------------------------------------------------------------------------------- HPI Details Patient  Name: Date of Service: TO Roma Schanz [name]. 05/06/2023 11:15 A M Medical Record Number: [id number] Patient Account Number: [id number] Date of Birth/Sex: Treating RN: Sep 28, 1940 (82 y.o. M) Primary Care Provider: Guerry Bruin Other Clinician: Referring Provider: Treating Provider/Extender: Wyline Beady in Treatment: 11 History of Present Illness Erik [name], Erik [name] ([id number]) [id number] Page 2 of 7 HPI Description: ADMISSION 02/18/2023 This is an 82 year old still very active man who was referred to [place] by Dr. Ardelle Anton at [facility]. He has a nonhealing wound on the plantar aspect of his right heel. He has been dealing with this since June when he presented to Dr. Loreta Ave with a painful area on the right mid heel. He was not really sure how this happened. He ultimately had an MRI in July that showed no osteomyelitis but there was a small abscess he was treated with antibiotics doxycycline and gradually the pain improved but he still been left with a small open area. He had an ABI on 11/14/2022 that was 1.08 on the right and 0.99 on the left. He by enlarge has been using Silvadene on the wound. Recently changed to silver alginate. He also developed a blister on the right anterior lower leg. Recent x-rays have not shown any cortical changes. Also in June he had a sedimentation rate of 2 and a C-reactive protein of 14 not suggestive of chronic active infection Past medical history includes type 2 diabetes, nonischemic cardiomyopathy, hypertension cholesterolemia, skin cancer, combined congestive heart failure, paroxysmal A-fib, idiopathic trigeminal neuralgia. 10/1; ulcer on the plantar aspect of his right heel. He is being compliant with the healing boot. We have been using Hydrofera Blue heel cup gauze. Dimensions are better 10/7; the area on the leg right anterior is healed. He has chronic insufficiency. Unfortunately the ulcer on the  plantar aspect of his right heel looks about the same each time with undermining the skin is not adherent to the underlying tissue. This requires debridement. We have been using Hydrofera Blue, heel cup and heel offloading shoe.  We are not making progress with the heel ulcer. 10/15; I brought the patient in today with the intention of putting him in a total contact cast if the wound on his plantar heel on the right was no better. However he comes in with erythema spreading towards the tip of his heel. There is also bogginess. There is no crepitus. He is a very active man. He is in the heel off loader but I do not think for 1 reason or another this is doing the job in terms of pressure and friction relief. I am concerned enough about the wound today not to put on a total contact cast 10/22; culture I did last week showed Staphylococcus epidermidis which is likely a skin contaminant but he also grew Staphylococcus capitis which was resistant to the doxycycline I put him on. I am going to give him a week of trimethoprim/sulfamethoxazole for this. I am not sure of the relevance of the coag negative staph however I do not want to put a cast and something that is infected. We have been using Iodoflex 10/29; patient with a right plantar heel wound. This was initially probably infectious with an MRI showing a small abscess but no osteomyelitis. 2 weeks ago he had erythema around the wound and bogginess but no crepitus I treated him with a prolonged course of antibiotics last week giving and will further week of Septra. We have been using mupirocin and silver alginate. The wound is measuring smaller today but there is undermining at 0.9 cm almost circumferentially 11/7; right plantar heel. This was not any smaller this week with considerable relative undermining almost circumferentially. This required debridement. I wanted to put this in a total contact cast but the patient was in a sling for a gene that we  would not have been able to get off over the cast. 11/18; small wound with significant relative depth. We have been using endoform and we will put a total contact cast on him for the first time today. 11/20; in for the first obligatory total contact cast we have been using endoform small punched-out area on the right plantar heel seems stable to improved 04/22/2023: There is a fair amount of undermining created by some skin and callus. There is some slough on the surface. He does have a small abrasion on his anterior tibial surface and dorsal foot from his cast. 12/3; wound on the right plantar heel. Again he comes in with undermining this is not a new phenomenon. We've been using endoform and a TCC. 12/10; right plantar heel. Again significant undermining maceration of the skin. All of this had to be removed. Fortunately the wound does not look infected however clearly not improving Electronic Signature(s) Signed: 05/07/2023 10:03:28 AM By: [name] MD Entered By: [name] on 05/06/2023 09:38:02 -------------------------------------------------------------------------------- Physical Exam Details Patient Name: Date of Service: TO Roma Schanz [name]. 05/06/2023 11:15 A M Medical Record Number: [id number] Patient Account Number: [id number] Date of Birth/Sex: Treating RN: Apr 26, 1941 (82 y.o. M) Primary Care Provider: Guerry Bruin Other Clinician: Referring Provider: Treating Provider/Extender: Wyline Beady in Treatment: 11 Notes Wound exam; considerable undermining macerated skin removed with scalpel pickups and then a curette. Fortunately this does not probe anywhere. There is no erythema no purulence nothing looked infected. After debridement we reapplied a TCC Electronic Signature(s) Signed: 05/07/2023 10:03:28 AM By: [name] MD Entered By: [name] on 05/06/2023 09:44:10 Erik [name] ([id number])  [id number] Page 3 of 7 --------------------------------------------------------------------------------  Physician Orders Details Patient Name: Date of Service: TO Erik [name]. 05/06/2023 11:15 A M Medical Record Number: [id number] Patient Account Number: [id number] Date of Birth/Sex: Treating RN: 18-Aug-1940 (82 y.o. Dianna Limbo Primary Care Provider: Guerry Bruin Other Clinician: Referring Provider: Treating Provider/Extender: Wyline Beady in Treatment: 11 Verbal / Phone Orders: No Diagnosis Coding Follow-up Appointments ppointment in 1 week. - Dr. Leanord Hawking +++Please ask Front desk for an appointment Return A ppointment in 2 weeks. - Dr. Leanord Hawking +++Please ask Front desk for an appointment Return A Return appointment in 3 weeks. - Dr. Leanord Hawking +++Please ask Front desk for an appointment Return appointment in 1 month. - Dr. Leanord Hawking +++Please ask Front desk for an appointment Anesthetic Wound #1 Right Calcaneus (In clinic) Topical Lidocaine 4% applied to wound bed Bathing/ Shower/ Hygiene May shower with protection but do not get wound dressing(s) wet. Protect dressing(s) with water repellant cover (for example, large plastic bag) or a cast cover and may then take shower. - purchase cast protector from [facility], [facility] or [employer] Off-Loading Total Contact Cast to Right Lower Extremity - size 3 Wound Treatment Wound #1 - Calcaneus Wound Laterality: Right Cleanser: Soap and Water 1 x Per Week/30 Days Discharge Instructions: May shower and wash wound with dial antibacterial soap and water prior to dressing change. Cleanser: Vashe 5.8 (oz) 1 x Per Week/30 Days Discharge Instructions: Cleanse the wound with Vashe prior to applying a clean dressing using gauze sponges, not tissue or cotton balls. Prim Dressing: Hydrofera Blue Ready Transfer Foam, 2.5x2.5 (in/in) ary 1 x Per Week/30 Days Discharge Instructions: Apply directly into  wound bed Prim Dressing: Maxorb Extra Calcium Alginate, 2x2 (in/in) 1 x Per Week/30 Days ary Discharge Instructions: Apply over [facility] Ready Secondary Dressing: ALLEVYN Heel 4 1/2in x 5 1/2in / 10.5cm x 13.5cm 1 x Per Week/30 Days Discharge Instructions: as needed Apply over primary dressing as directed. ****AND TO TOES. Secondary Dressing: Optifoam Non-Adhesive Dressing, 4x4 in 1 x Per Week/30 Days Discharge Instructions: as needed -Apply lateral side of foot Secondary Dressing: Woven Gauze Sponge, Non-Sterile 4x4 in 1 x Per Week/30 Days Discharge Instructions: Apply over primary dressing as directed. Secondary Dressing: T Contact Cast 1 x Per Week/30 Days otal Discharge Instructions: last layer applied by provider. Electronic Signature(s) Signed: 05/06/2023 4:58:30 PM By: Karie Schwalbe RN Signed: 05/07/2023 10:03:28 AM By: [name] MD Entered By: Karie Schwalbe on 05/06/2023 09:03:25 Erik [name] ([id number]) [id number] Page 4 of 7 -------------------------------------------------------------------------------- Problem List Details Patient Name: Date of Service: TO Erik [name]. 05/06/2023 11:15 A M Medical Record Number: [id number] Patient Account Number: [id number] Date of Birth/Sex: Treating RN: July 08, 1940 (82 y.o. 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 05/06/2023  11:15 A M Medical Record Number: [id number] Patient Account Number: [id number] Date of Birth/Sex: Treating RN: [birth year] (82 y.o. M) Primary Care Provider: Guerry Bruin Other Clinician: Referring Provider: Treating Provider/Extender: Wyline Beady in Treatment: 11 Subjective History of Present Illness (HPI) ADMISSION 02/18/2023 This is an 82 year old still very active man who was referred to [place] by Dr. Ardelle Anton at [facility]. He has a nonhealing wound on the plantar aspect of his right heel. He has been dealing with this since June when he presented to Dr. Loreta Ave with a painful area on the right mid heel. He was not really sure how this happened. He ultimately had an MRI in July that showed no osteomyelitis but there was a small abscess he was treated with antibiotics doxycycline and gradually the pain improved but he still been left with a small open area. He had an ABI on 11/14/2022 that was 1.08 on the right and 0.99 on the left. He by enlarge has been using Silvadene on the wound. Recently changed to silver alginate. He also developed a blister on the right anterior lower leg. Recent x-rays have not shown any cortical changes. Also in June he had a sedimentation rate of 2 and a C-reactive protein of 14 not suggestive of chronic active infection Past medical history includes type 2 diabetes, nonischemic cardiomyopathy, hypertension cholesterolemia, skin cancer, combined congestive heart failure, paroxysmal A-fib, idiopathic trigeminal  neuralgia. MAYCON, GRAP ([id number]) [id number] Page 5 of 7 10/1; ulcer on the plantar aspect of his right heel. He is being compliant with the healing boot. We have been using Hydrofera Blue heel cup gauze. Dimensions are better 10/7; the area on the leg right anterior is healed. He has chronic insufficiency. Unfortunately the ulcer on the plantar aspect of his right heel looks about the same each time with undermining the skin is not adherent to the underlying tissue. This requires debridement. We have been using Hydrofera Blue, heel cup and heel offloading shoe. We are not making progress with the heel ulcer. 10/15; I brought the patient in today with the intention of putting him in a total contact cast if the wound on his plantar heel on the right was no better. However he comes in with erythema spreading towards the tip of his heel. There is also bogginess. There is no crepitus. He is a very active man. He is in the heel off loader but I do not think for 1 reason or another this is doing the job in terms of pressure and friction relief. I am concerned enough about the wound today not to put on a total contact cast 10/22; culture I did last week showed Staphylococcus epidermidis which is likely a skin contaminant but he also grew Staphylococcus capitis which was resistant to the doxycycline I put him on. I am going to give him a week of trimethoprim/sulfamethoxazole for this. I am not sure of the relevance of the coag negative staph however I do not want to put a cast and something that is infected. We have been using Iodoflex 10/29; patient with a right plantar heel wound. This was initially probably infectious with an MRI showing a small abscess but no osteomyelitis. 2 weeks ago he had erythema around the wound and bogginess but no crepitus I treated him with a prolonged course of antibiotics last week giving and will further week of Septra. We have been using  mupirocin and silver alginate. The wound is measuring smaller today but there is  undermining at 0.9 cm almost circumferentially 11/7; right plantar heel. This was not any smaller this week with considerable relative undermining almost circumferentially. This required debridement. I wanted to put this in a total contact cast but the patient was in a sling for a gene that we would not have been able to get off over the cast. 11/18; small wound with significant relative depth. We have been using endoform and we will put a total contact cast on him for the first time today. 11/20; in for the first obligatory total contact cast we have been using endoform small punched-out area on the right plantar heel seems stable to improved 04/22/2023: There is a fair amount of undermining created by some skin and callus. There is some slough on the surface. He does have a small abrasion on his anterior tibial surface and dorsal foot from his cast. 12/3; wound on the right plantar heel. Again he comes in with undermining this is not a new phenomenon. We've been using endoform and a TCC. 12/10; right plantar heel. Again significant undermining maceration of the skin. All of this had to be removed. Fortunately the wound does not look infected however clearly not improving Objective Constitutional Vitals Time Taken: 11:15 AM, Height: 71 in, Weight: 193 lbs, BMI: 26.9, Temperature: 98 F, Pulse: 83 bpm, Respiratory Rate: 16 breaths/min, Blood Pressure: 149/66 mmHg, Capillary Blood Glucose: 171 mg/dl. General Notes: unable to obtain temperature because he is drinking liquid. Integumentary (Hair, Skin) Wound #1 status is Open. Original cause of wound was Gradually Appeared. The date acquired was: 01/21/2023. The wound has been in treatment 11 weeks. The wound is located on the Right Calcaneus. The wound measures 1cm length x 1cm width x 0.3cm depth; 0.785cm^2 area and 0.236cm^3 volume. There is Fat Layer (Subcutaneous  Tissue) exposed. There is no tunneling noted, however, there is undermining starting at 4:00 and ending at 6:00 with a maximum distance of 0.4cm. There is a medium amount of serosanguineous drainage noted. The wound margin is distinct with the outline attached to the wound base. There is medium (34-66%) pink, pale granulation within the wound bed. There is a medium (34-66%) amount of necrotic tissue within the wound bed including Adherent Slough. The periwound skin appearance had no abnormalities noted for moisture. The periwound skin appearance had no abnormalities noted for color. The periwound skin appearance exhibited: Callus. Periwound temperature was noted as No Abnormality. 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 There was a Excisional Skin/Subcutaneous Tissue Debridement with a total area of 0.78 sq cm performed by Maxwell Caul., MD. With the following instrument(s): Blade, Curette, Forceps, and Scissors to remove Viable and Non-Viable tissue/material. Material removed includes Subcutaneous Tissue, Skin: Dermis, and Skin: Epidermis after achieving pain control using Lidocaine 4% T opical Solution. No specimens were taken. A time out was conducted at 11:45, prior to the start of the procedure. A Minimum amount of bleeding was controlled with Silver Nitrate. The procedure was tolerated well. Post Debridement Measurements: 1cm length x 1cm width x 0.3cm depth; 0.236cm^3 volume. Character of Wound/Ulcer Post Debridement is improved. Severity of Tissue Post Debridement is: Fat layer  exposed. Erik [name] ([id number]) [id number] Page 6 of 7 Post procedure Diagnosis Wound #1: Same as Pre-Procedure Pre-procedure diagnosis of Wound #1 is a Diabetic Wound/Ulcer of the Lower Extremity located on the Right Calcaneus . There was a T Contact Cast otal Procedure by Maxwell Caul., MD. Post procedure Diagnosis Wound #1: Same as Pre-Procedure Plan Follow-up Appointments: Return Appointment in 1 week. - Dr. Leanord Hawking +++Please ask Front desk for an appointment Return Appointment in 2 weeks. - Dr. Leanord Hawking +++Please ask Front desk for an appointment Return appointment in 3 weeks. - Dr. Leanord Hawking +++Please ask Front desk for an appointment Return appointment in 1 month. - Dr. Leanord Hawking +++Please ask Front desk for an appointment Anesthetic: Wound #1 Right Calcaneus: (In clinic) Topical Lidocaine 4% applied to wound bed Bathing/ Shower/ Hygiene: May shower with protection but do not get wound dressing(s) wet. Protect dressing(s) with water repellant cover (for example, large plastic bag) or a cast cover and may then take shower. - purchase cast protector from [facility], [facility] or [employer] Off-Loading: T Contact Cast to Right Lower Extremity - size 3 otal WOUND #1: - Calcaneus Wound Laterality: Right Cleanser: Soap and Water 1 x Per Week/30 Days Discharge Instructions: May shower and wash wound with dial antibacterial soap and water prior to dressing change. Cleanser: Vashe 5.8 (oz) 1 x Per Week/30 Days Discharge Instructions: Cleanse the wound with Vashe prior to applying a clean dressing using gauze sponges, not tissue or cotton balls. Prim Dressing: Hydrofera Blue Ready Transfer Foam, 2.5x2.5 (in/in) 1 x Per Week/30 Days ary Discharge Instructions: Apply directly into wound bed Prim Dressing: Maxorb Extra Calcium Alginate, 2x2 (in/in) 1 x Per Week/30 Days ary Discharge Instructions: Apply over [facility] Ready Secondary Dressing: ALLEVYN Heel 4 1/2in x  5 1/2in / 10.5cm x 13.5cm 1 x Per Week/30 Days Discharge Instructions: as needed Apply over primary dressing as directed. ****AND TO TOES. Secondary Dressing: Optifoam Non-Adhesive Dressing, 4x4 in 1 x Per Week/30 Days Discharge Instructions: as needed -Apply lateral side of foot Secondary Dressing: Woven Gauze Sponge, Non-Sterile 4x4 in 1 x Per Week/30 Days Discharge Instructions: Apply over primary dressing as directed. Secondary Dressing: T Contact Cast 1 x Per Week/30 Days otal Discharge Instructions: last layer applied by provider. 1. I change the primary dressing from endoform to Hydrofera Blue packed with calcium alginate a heel cup in the cast. Far too much maceration. I did not pick up on excessive drainage 2. Fortunately no evidence of infection today Electronic Signature(s) Signed: 05/07/2023 10:03:28 AM By: [name] MD Entered By: [name] on 05/06/2023 09:45:31 -------------------------------------------------------------------------------- Total Contact Cast Details Patient Name: Date of Service: TO Roma Schanz [name]. 05/06/2023 11:15 A M Medical Record Number: [id number] Patient Account Number: [id number] Date of Birth/Sex: Treating RN: [birth year] (82 y.o. M) Primary Care Provider: Guerry Bruin Other Clinician: Referring Provider: Treating Provider/Extender: Wyline Beady in Treatment: 11 T Contact Cast Applied for Wound Assessment: otal Wound #1 Right Calcaneus Performed By: Physician Maxwell Caul., MD The following information was scribed by: Karie Schwalbe The information was scribed for: [name] Post Procedure Diagnosis Same as Pre-procedure Electronic Signature(s) Erik [name] ([id number]) [id number] Page 7 of 7 Signed: 05/07/2023 10:03:28 AM By: [name] MD Entered By: [name] on 05/06/2023  09:35:09 -------------------------------------------------------------------------------- SuperBill Details Patient Name: Date of Service: TO Roma Schanz [name]. 05/06/2023 Medical Record Number: [id number] Patient Account Number: [id number] Date of Birth/Sex: Treating RN: 02/09/1941 (82 y.o. 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

## 2023-05-07 NOTE — Progress Notes (Signed)
JEHAD, BAACK ([id number]) [id number] Page 1 of 7 Visit Report for 05/06/2023 Arrival Information Details Patient Name: Date of Service: TO [name] 05/06/2023 11:15 A M Medical Record Number: [id number] Patient Account Number: [id number] Date of Birth/Sex: Treating RN: 05/02/1941 (82 y.o. Dianna Limbo Primary Care Natoria Archibald: Guerry Bruin Other Clinician: Referring Shevon Sian: Treating Elisse Pennick/Extender: Wyline Beady in Treatment: 11 Visit Information History Since Last Visit Added or deleted any medications: No Patient Arrived: Dan Humphreys Any new allergies or adverse reactions: No Arrival Time: 11:14 Had a fall or experienced change in No Accompanied By: spouse activities of daily living that may affect Transfer Assistance: None risk of falls: Patient Identification Verified: Yes Signs or symptoms of abuse/neglect since last visito No Patient Requires Transmission-Based Precautions: No Hospitalized since last visit: No Patient Has Alerts: Yes Implantable device outside of the clinic excluding No Patient Alerts: ABI: R 1.08 11/14/22 cellular tissue based products placed in the center since last visit: Has Dressing in Place as Prescribed: Yes Pain Present Now: No Electronic Signature(s) Signed: 05/06/2023 4:58:30 PM By: Karie Schwalbe RN Entered By: Karie Schwalbe on 05/06/2023 08:15:09 -------------------------------------------------------------------------------- Encounter Discharge Information Details Patient Name: Date of Service: TO [name] 05/06/2023 11:15 A M Medical Record Number: [id number] Patient Account Number: [id number] Date of Birth/Sex: Treating RN: Sep 06, 1940 (82 y.o. Dianna Limbo Primary Care Kahlen Boyde: Guerry Bruin Other Clinician: Referring Myeshia Fojtik: Treating Tempie Gibeault/Extender: Wyline Beady in Treatment: 11 Encounter Discharge Information  Items Post Procedure Vitals Discharge Condition: Stable Temperature (F): 98 Ambulatory Status: Walker Pulse (bpm): 83 Discharge Destination: Home Respiratory Rate (breaths/min): 16 Transportation: Private Auto Blood Pressure (mmHg): 149/66 Accompanied By: spouse Schedule Follow-up Appointment: Yes Clinical Summary of Care: Patient Declined Electronic Signature(s) Signed: 05/06/2023 4:58:30 PM By: Karie Schwalbe RN Entered By: Karie Schwalbe on 05/06/2023 13:46:33 [name] ([id number]) [id number] Page 2 of 7 -------------------------------------------------------------------------------- Lower Extremity Assessment Details Patient Name: Date of Service: TO [name] 05/06/2023 11:15 A M Medical Record Number: [id number] Patient Account Number: [id number] Date of Birth/Sex: Treating RN: [birth year] (82 y.o. Dianna Limbo Primary Care Jazier Mcglamery: Guerry Bruin Other Clinician: Referring Kimble Hitchens: Treating Machael Raine/Extender: Wyline Beady in Treatment: 11 Edema Assessment Assessed: [name]: No] [name]: No] Edema: [Left: Ye] [Right: s] Calf Left: Right: Point of Measurement: From Medial Instep 36 cm Ankle Left: Right: Point of Measurement: From Medial Instep 26 cm Vascular Assessment Pulses: Dorsalis Pedis Palpable: [Right:Yes] Extremity colors, hair growth, and conditions: Extremity Color: [Right:Normal] Hair Growth on Extremity: [Right:No] Temperature of Extremity: [Right:Warm] Capillary Refill: [Right:< 3 seconds] Dependent Rubor: [Right:No Yes] Electronic Signature(s) Signed: 05/06/2023 4:58:30 PM By: Karie Schwalbe RN Entered By: Karie Schwalbe on 05/06/2023 08:22:22 -------------------------------------------------------------------------------- Multi Wound Chart Details Patient Name: Date of Service: TO [name] 05/06/2023 11:15 A M Medical Record Number: [id number] Patient Account  Number: [id number] Date of Birth/Sex: Treating RN: 1940/10/10 (82 y.o. M) Primary Care Malakye Nolden: Guerry Bruin Other Clinician: Referring Jecenia Leamer: Treating Deanda Ruddell/Extender: Wyline Beady in Treatment: 11 Vital Signs Height(in): 71 Capillary Blood Glucose(mg/dl): 416 Weight(lbs): 606 Pulse(bpm): 83 Body Mass Index(BMI): 26.9 Blood Pressure(mmHg): 149/66 Temperature(F): 98 Respiratory Rate(breaths/min): 16 [1:Photos:] [N/A:N/A] Right Calcaneus N/A N/A Wound Location: Gradually Appeared N/A N/A Wounding Event: Diabetic Wound/Ulcer of the Lower N/A N/A Primary Etiology: Extremity Anemia, Arrhythmia, Coronary Artery N/A N/A Comorbid History: Disease, Hypertension, Peripheral Arterial Disease, Type II Diabetes, Neuropathy 01/21/2023 N/A N/A Date Acquired: 11  N/A N/A Weeks of Treatment: Open N/A N/A Wound Status: No N/A N/A Wound Recurrence: 1x1x0.3 N/A N/A Measurements L x W x D (cm) 0.785 N/A N/A A (cm) : rea 0.236 N/A N/A Volume (cm) : -456.70% N/A N/A % Reduction in A rea: -742.90% N/A N/A % Reduction in Volume: 4 Starting Position 1 (o'clock): 6 Ending Position 1 (o'clock): 0.4 Maximum Distance 1 (cm): Yes N/A N/A Undermining: Grade 2 N/A N/A Classification: Medium N/A N/A Exudate A mount: Serosanguineous N/A N/A Exudate Type: red, brown N/A N/A Exudate Color: Distinct, outline attached N/A N/A Wound Margin: Medium (34-66%) N/A N/A Granulation A mount: Pink, Pale N/A N/A Granulation Quality: Medium (34-66%) N/A N/A Necrotic A mount: Fat Layer (Subcutaneous Tissue): Yes N/A N/A Exposed Structures: Fascia: No Tendon: No Muscle: No Joint: No Bone: No Small (1-33%) N/A N/A Epithelialization: Debridement - Excisional N/A N/A Debridement: Pre-procedure Verification/Time Out 11:45 N/A N/A Taken: Lidocaine 4% T opical Solution N/A N/A Pain Control: Subcutaneous N/A N/A Tissue Debrided: Skin/Subcutaneous Tissue  N/A N/A Level: 0.78 N/A N/A Debridement A (sq cm): rea Blade, Curette, Forceps, Scissors N/A N/A Instrument: Minimum N/A N/A Bleeding: Silver Nitrate N/A N/A Hemostasis A chieved: Procedure was tolerated well N/A N/A Debridement Treatment Response: 1x1x0.3 N/A N/A Post Debridement Measurements L x W x D (cm) 0.236 N/A N/A Post Debridement Volume: (cm) Callus: Yes N/A N/A Periwound Skin Texture: No Abnormalities Noted N/A N/A Periwound Skin Moisture: No Abnormalities Noted N/A N/A Periwound Skin Color: No Abnormality N/A N/A Temperature: Debridement N/A N/A Procedures Performed: T Contact Cast otal Treatment Notes Electronic Signature(s) Signed: 05/07/2023 10:03:28 AM By: [name] MD Entered By: [name] on 05/06/2023 09:34:46 -------------------------------------------------------------------------------- Multi-Disciplinary Care Plan Details Patient Name: Date of Service: TO [name] NK W. 05/06/2023 11:15 A Sandrea Hammond ([id number]) [id number] Page 4 of 7 Medical Record Number: [id number] Patient Account Number: [id number] Date of Birth/Sex: Treating RN: 1941/02/21 (82 y.o. Dianna Limbo Primary Care Nylene Inlow: Guerry Bruin Other Clinician: Referring Tod Abrahamsen: Treating Julious Langlois/Extender: Wyline Beady in Treatment: 11 Multidisciplinary Care Plan reviewed with physician Active Inactive Wound/Skin Impairment Nursing Diagnoses: Impaired tissue integrity Knowledge deficit related to ulceration/compromised skin integrity Goals: Patient/caregiver will verbalize understanding of skin care regimen Date Initiated: 02/18/2023 Target Resolution Date: 07/25/2023 Goal Status: Active Ulcer/skin breakdown will have a volume reduction of 30% by week 4 Date Initiated: 02/18/2023 Target Resolution Date: 07/25/2023 Goal Status: Active Interventions: Assess patient/caregiver ability to obtain  necessary supplies Assess patient/caregiver ability to perform ulcer/skin care regimen upon admission and as needed Assess ulceration(s) every visit Provide education on ulcer and skin care Notes: Electronic Signature(s) Signed: 05/06/2023 4:58:30 PM By: Karie Schwalbe RN Entered By: Karie Schwalbe on 05/06/2023 08:47:33 -------------------------------------------------------------------------------- Pain Assessment Details Patient Name: Date of Service: TO [name] 05/06/2023 11:15 A M Medical Record Number: [id number] Patient Account Number: [id number] Date of Birth/Sex: Treating RN: 28-Aug-1940 (82 y.o. Dianna Limbo Primary Care Leila Schuff: Guerry Bruin Other Clinician: Referring Elianie Hubers: Treating Yaeli Hartung/Extender: Wyline Beady in Treatment: 11 Active Problems Location of Pain Severity and Description of Pain Patient Has Paino No Site Locations DEWARREN, ALAMOS ([id number]) [phone].pdf Page 5 of 7 Pain Management and Medication Current Pain Management: Electronic Signature(s) Signed: 05/06/2023 4:58:30 PM By: Karie Schwalbe RN Entered By: Karie Schwalbe on 05/06/2023 08:15:37 -------------------------------------------------------------------------------- Patient/Caregiver Education Details Patient Name: Date of Service: TO [name] 12/10/2024andnbsp11:15 A M Medical Record Number: [id number] Patient Account Number:  [id number] Date of Birth/Gender: Treating RN: 1940-10-21 (82 y.o. Dianna Limbo Primary Care Physician: Guerry Bruin Other Clinician: Referring Physician: Treating Physician/Extender: Wyline Beady in Treatment: 11 Education Assessment Education Provided To: Patient Education Topics Provided Wound/Skin Impairment: Methods: Explain/Verbal Responses: State content correctly [employer]) Signed: 05/06/2023 4:58:30 PM By: Karie Schwalbe  RN Entered By: Karie Schwalbe on 05/06/2023 08:48:08 -------------------------------------------------------------------------------- Wound Assessment Details Patient Name: Date of Service: TO [name] 05/06/2023 11:15 A M Medical Record Number: [id number] Patient Account Number: [id number] Date of Birth/Sex: Treating RN: 18-Nov-1940 (82 y.o. Dianna Limbo Primary Care Adalyne Lovick: Guerry Bruin Other Clinician: Referring Tilak Oakley: Treating Oveta Idris/Extender: Kyrone, Wittig ([id number]) [id number] Page 6 of 7 Weeks in Treatment: 11 Wound Status Wound Number: 1 Primary Diabetic Wound/Ulcer of the Lower Extremity Etiology: Wound Location: Right Calcaneus Wound Open Wounding Event: Gradually Appeared Status: Date Acquired: 01/21/2023 Comorbid Anemia, Arrhythmia, Coronary Artery Disease, Hypertension, Weeks Of Treatment: 11 History: Peripheral Arterial Disease, Type II Diabetes, Neuropathy Clustered Wound: No Photos Wound Measurements Length: (cm) 1 Width: (cm) 1 Depth: (cm) 0.3 Area: (cm) 0.785 Volume: (cm) 0.236 % Reduction in Area: -456.7% % Reduction in Volume: -742.9% Epithelialization: Small (1-33%) Tunneling: No Undermining: Yes Starting Position (o'clock): 4 Ending Position (o'clock): 6 Maximum Distance: (cm) 0.4 Wound Description Classification: Grade 2 Wound Margin: Distinct, outline attached Exudate Amount: Medium Exudate Type: Serosanguineous Exudate Color: red, brown Foul Odor After Cleansing: No Slough/Fibrino Yes Wound Bed Granulation Amount: Medium (34-66%) Exposed Structure Granulation Quality: Pink, Pale Fascia Exposed: No Necrotic Amount: Medium (34-66%) Fat Layer (Subcutaneous Tissue) Exposed: Yes Necrotic Quality: Adherent Slough Tendon Exposed: No Muscle Exposed: No Joint Exposed: No Bone Exposed: No Periwound Skin Texture Texture Color No Abnormalities Noted: No No  Abnormalities Noted: Yes Callus: Yes Temperature / Pain Temperature: No Abnormality Moisture No Abnormalities Noted: Yes Treatment Notes Wound #1 (Calcaneus) Wound Laterality: Right Cleanser Soap and Water Discharge Instruction: May shower and wash wound with dial antibacterial soap and water prior to dressing change. Vashe 5.8 (oz) Discharge Instruction: Cleanse the wound with Vashe prior to applying a clean dressing using gauze sponges, not tissue or cotton balls. Peri-Wound Care Topical Primary Dressing SEATON, OAKLEY ([id number]) [phone].pdf Page 7 of 7 Hydrofera Blue Ready Transfer Foam, 2.5x2.5 (in/in) Discharge Instruction: Apply directly into wound bed Maxorb Extra Calcium Alginate, 2x2 (in/in) Discharge Instruction: Apply over Hydrafera Blue Ready Secondary Dressing ALLEVYN Heel 4 1/2in x 5 1/2in / 10.5cm x 13.5cm Discharge Instruction: as needed Apply over primary dressing as directed. ****AND TO TOES. Optifoam Non-Adhesive Dressing, 4x4 in Discharge Instruction: as needed -Apply lateral side of foot Woven Gauze Sponge, Non-Sterile 4x4 in Discharge Instruction: Apply over primary dressing as directed. T Contact Cast otal Discharge Instruction: last layer applied by Myliyah Rebuck. Secured With Compression Wrap Compression Stockings Facilities manager) Signed: 05/06/2023 4:58:30 PM By: Karie Schwalbe RN Entered By: Karie Schwalbe on 05/06/2023 08:57:49 -------------------------------------------------------------------------------- Vitals Details Patient Name: Date of Service: TO [name] 05/06/2023 11:15 A M Medical Record Number: [id number] Patient Account Number: [id number] Date of Birth/Sex: Treating RN: 05/04/1941 (82 y.o. Dianna Limbo Primary Care Graviela Nodal: Guerry Bruin Other Clinician: Referring Joeanne Robicheaux: Treating Cecylia Brazill/Extender: Wyline Beady in Treatment: 11 Vital  Signs Time Taken: 11:15 Temperature (F): 98 Height (in): 71 Pulse (bpm): 83 Weight (lbs): 193 Respiratory Rate (breaths/min): 16 Body Mass Index (BMI): 26.9 Blood Pressure (mmHg): 149/66 Capillary Blood Glucose (mg/dl): 433 Reference Range:  80 - 120 mg / dl Notes unable to obtain temperature because he is drinking liquid. Electronic Signature(s) Signed: 05/06/2023 4:58:30 PM By: Karie Schwalbe RN Entered By: Karie Schwalbe on 05/06/2023 08:54:10

## 2023-05-13 ENCOUNTER — Ambulatory Visit: Payer: PPO | Admitting: Vascular Surgery

## 2023-05-13 NOTE — Progress Notes (Deleted)
Patient name: Erik [name] MRN: [id number] DOB: 1941/01/06 Sex: male  REASON FOR CONSULT: 6-8 week wound check, right heel ulcer  HPI: Erik [name] is a 82 y.o. male, with history of hypertension, diabetes, A-fib, CKD, CHF that presents for evaluation of right heel ulcer.  He states his right heel ulcer has been present for about 6 to 7 months.  He does not know what caused it.  No associated trauma.  He was initially followed by Dr. Loreta Ave with podiatry but is now going to the wound clinic.  He denies any previous vascular interventions.  He is ambulatory.  Past Medical History:  Diagnosis Date   Chronic combined systolic and diastolic CHF (congestive heart failure) (HCC)    CKD (chronic kidney disease), stage III (HCC)    Coronary artery disease    a. Nonobstructive by cath 2004. b. Nuc 09/2013 - no ischemia, fixed defect suggestive of possible prior infarction, felt low risk, EF 48%.    Diabetes mellitus    GERD (gastroesophageal reflux disease)    Hyperlipidemia    Hypertension    NICM (nonischemic cardiomyopathy) (HCC)    a. EF 35-40% in 2009, 55% in 2010. b. 09/2013: 35-40% by echo and 48% by nuc. c. EF 50-55% by echo 12/2014.   PAD (peripheral artery disease) (HCC)    a. mild by noninvasive testing.   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    Peripheral arterial disease (HCC)    Premature ventricular contractions    Thrombocytopenia (HCC)    Trigeminal neuralgia     Past Surgical History:  Procedure Laterality Date   CARDIAC CATHETERIZATION  10/25/00, 12/29/02   CHOLECYSTECTOMY N/A 01/25/2015   Procedure: LAPAROSCOPIC CHOLECYSTECTOMY ;  Surgeon: Abigail Miyamoto, MD;  Location: [facility] OR;  Service: General;  Laterality: N/A;   SHOULDER SURGERY Left 2011   Shoulder surgery, rod inserted    Family History  Problem Relation Age of Onset   Heart disease Mother    Diabetes Mother    Hypertension Mother    Heart disease Father     Heart disease Brother    Diabetes Brother    Hypertension Brother    Heart attack Neg Hx    Stroke Neg Hx     SOCIAL HISTORY: Social History   Socioeconomic History   Marital status: Married    Spouse name: Not on file   Number of children: Not on file   Years of education: Not on file   Highest education level: Not on file  Occupational History   Occupation: retired    Comment: from maintenance  Tobacco Use   Smoking status: Former    Current packs/day: 0.00    Average packs/day: 0.5 packs/day for 10.0 years (5.0 ttl pk-yrs)    Types: Cigarettes    Start date: 01/22/1975    Quit date: 01/21/1985    Years since quitting: 38.3   Smokeless tobacco: Never   Tobacco comments:    quit 35 yrs ago  Vaping Use   Vaping status: Never Used  Substance and Sexual Activity   Alcohol use: No   Drug use: No   Sexual activity: Not Currently    Birth control/protection: None  Other Topics Concern   Not on file  Social History Narrative   Lives with wife in a one story home.  Has 3 children.  Retired from IT trainer at [employer].     Education: high  school.      Right Handed    Lives in a one story    Social Drivers of Health   Financial Resource Strain: Not on file  Food Insecurity: No Food Insecurity (02/25/2020)   Hunger Vital Sign    Worried About Running Out of Food in the Last Year: Never true    Ran Out of Food in the Last Year: Never true  Transportation Needs: Not on file  Physical Activity: Not on file  Stress: Not on file  Social Connections: Not on file  Intimate Partner Violence: Not on file    Allergies  Allergen Reactions   Claritin [Loratadine] Other (See Comments)    Urinary retention   Tradjenta [Linagliptin] Other (See Comments)    High blood sugar   Imipramine     Other reaction(s): hallucination/dizziness    Current Outpatient Medications  Medication Sig Dispense Refill   acetaminophen (TYLENOL) 325 MG tablet Take 2 tablets (650  mg total) by mouth every 6 (six) hours as needed for mild pain (or Fever >/= 101).     atorvastatin (LIPITOR) 40 MG tablet Take 1 tablet by mouth daily.     Baclofen 5 MG TABS Take 1 tablet (5 mg total) by mouth at bedtime as needed. 10 tablet 0   BD PEN NEEDLE NANO U/F 32G X 4 MM MISC USE 1 NEEDLE WITH INSULIN INJECTION ONCE DAILY  2   Blood Glucose Calibration (TRUE METRIX LEVEL 1) Low SOLN USE TO CALIBRATE GLUCOMETER ONCE MONTHY OR WITH EACH NEW VIAL OF STRIPS, WHICHEVER COMES FIRST     Blood Glucose Monitoring Suppl (TRUE METRIX AIR GLUCOSE METER) w/Device KIT use to check blood sugar three times daily prior to meals. E11.29     Coenzyme Q10 (COQ10) 200 MG CAPS Take 100 mg by mouth daily.      Continuous Blood Gluc Receiver (FREESTYLE LIBRE 2 READER) DEVI See admin instructions.     Continuous Blood Gluc Sensor (FREESTYLE LIBRE 2 SENSOR) MISC Inject into the skin every 14 (fourteen) days.     dapagliflozin propanediol (FARXIGA) 10 MG TABS tablet Take 1 tablet (10 mg total) by mouth daily before breakfast. 30 tablet 6   doxycycline (VIBRA-TABS) 100 MG tablet Take 1 tablet (100 mg total) by mouth 2 (two) times daily. 14 tablet 0   finasteride (PROSCAR) 5 MG tablet Take 5 mg by mouth once daily  3   furosemide (LASIX) 40 MG tablet Take 1 tablet (40 mg total) by mouth daily 30 tablet 5   gabapentin (NEURONTIN) 300 MG capsule Take 1 capsule (300 mg total) by mouth 2 (two) times daily. 180 capsule 3   glucose blood (TRUE METRIX BLOOD GLUCOSE TEST) test strip use to check blood sugar three times daily prior to meals. E11.29     ipratropium (ATROVENT) 0.03 % nasal spray      lidocaine (LIDODERM) 5 % Place 1 patch onto the skin daily. Remove & Discard patch within 12 hours or as directed by MD 30 patch 0   metoprolol succinate (TOPROL XL) 50 MG 24 hr tablet Take 1 tablet (50 mg total) by mouth daily. Take with or immediately following a meal.     moxifloxacin (VIGAMOX) 0.5 % ophthalmic solution Place 1  drop into the left eye 4 (four) times daily.     Multiple Vitamin (MULTIVITAMIN) capsule Take 1 capsule by mouth daily.     OXcarbazepine (TRILEPTAL) 150 MG tablet Take 0.5 tablets (75 mg total) by mouth 2 (  two) times daily. 90 tablet 3   pantoprazole (PROTONIX) 20 MG tablet Take 20 mg by mouth daily.     potassium chloride SA (KLOR-CON M) 20 MEQ tablet Take 1 tablet (20 mEq total) by mouth daily for 5 days only.  Take in concurrence with 5 day increased lasix. 5 tablet 0   PROLENSA 0.07 % SOLN      Rivaroxaban (XARELTO) 15 MG TABS tablet Take 1 tablet (15 mg total) by mouth daily with supper. 90 tablet 3   selenium 200 MCG TABS tablet Take 200 mcg by mouth daily.     silver sulfADIAZINE (SILVADENE) 1 % cream Apply 1 Application topically daily. 50 g 0   tamsulosin (FLOMAX) 0.4 MG CAPS capsule Take 0.4 mg by mouth at bedtime.     TOUJEO SOLOSTAR 300 UNIT/ML SOPN Inject 36 Units as directed daily.   4   Trospium Chloride 60 MG CP24      TRUEplus Lancets 33G MISC use to check blood sugar three times daily prior to meals. E11.29     No current facility-administered medications for this visit.    REVIEW OF SYSTEMS:  [X]  denotes positive finding, [ ]  denotes negative finding Cardiac  Comments:  Chest pain or chest pressure:    Shortness of breath upon exertion:    Short of breath when lying flat:    Irregular heart rhythm:        Vascular    Pain in calf, thigh, or hip brought on by ambulation:    Pain in feet at night that wakes you up from your sleep:     Blood clot in your veins:    Leg swelling:         Pulmonary    Oxygen at home:    Productive cough:     Wheezing:         Neurologic    Sudden weakness in arms or legs:     Sudden numbness in arms or legs:     Sudden onset of difficulty speaking or slurred speech:    Temporary loss of vision in one eye:     Problems with dizziness:         Gastrointestinal    Blood in stool:     Vomited blood:         Genitourinary     Burning when urinating:     Blood in urine:        Psychiatric    Major depression:         Hematologic    Bleeding problems:    Problems with blood clotting too easily:        Skin    Rashes or ulcers:        Constitutional    Fever or chills:      PHYSICAL EXAM: There were no vitals filed for this visit.  GENERAL: The patient is a well-nourished male, in no acute distress. The vital signs are documented above. CARDIAC: There is a regular rate and rhythm.  VASCULAR:  Right femoral pulse palpable Right popliteal pulse palpable Right DP palpable Right heel ulcer as pictured PULMONARY: No respiratory distress. ABDOMEN: Soft and non-tender. MUSCULOSKELETAL: There are no major deformities or cyanosis. NEUROLOGIC: No focal weakness or paresthesias are detected. PSYCHIATRIC: The patient has a normal affect.    DATA:   ABIs 11/14/2022 are 1.08 on the right triphasic and 0.99 on the left triphasic  Assessment/Plan:  82 y.o. male, with history of hypertension, diabetes,  A-fib, CKD, CHF that presents for evaluation of right heel ulcer.  I discussed that he has a palpable dorsalis pedis pulse on exam as well as a normal ABI of 1.08 with a triphasic waveform at the ankle.  I suspect he has enough inflow to heal his ulcer as pictured.  In addition he has pulsatile toe tracings with a toe pressure of 83 that should be more than adequate for wound healing.  I discussed typically want a toe pressure of 60-80 to allow adequate wound healing.  I will plan to see him in 6 to 8 weeks for wound check.  I discussed if his wound gets worse we could always pursue angiography in the future but again I think he has adequate inflow with normal noninvasive studies and a palpable pulse.   Cephus Shelling, MD [facility] Office: [phone]

## 2023-05-15 ENCOUNTER — Encounter (HOSPITAL_BASED_OUTPATIENT_CLINIC_OR_DEPARTMENT_OTHER): Payer: PPO | Admitting: Internal Medicine

## 2023-05-15 DIAGNOSIS — E11621 Type 2 diabetes mellitus with foot ulcer: Secondary | ICD-10-CM | POA: Diagnosis not present

## 2023-05-15 DIAGNOSIS — L97412 Non-pressure chronic ulcer of right heel and midfoot with fat layer exposed: Secondary | ICD-10-CM | POA: Diagnosis not present

## 2023-05-16 NOTE — Progress Notes (Signed)
Erik, [name] ([id number]) [id number] Page 1 of 7 Visit Report for 05/15/2023 HPI Details Patient Name: Date of Service: TO Erik [name] NK W. 05/15/2023 2:00 PM Medical Record Number: [id number] Patient Account Number: [id number] Date of Birth/Sex: Treating RN: 1941/03/23 (82 y.o. M) Primary Care Provider: Guerry Bruin Other Clinician: Referring Provider: Treating Provider/Extender: Wyline Beady in Treatment: 12 History of Present Illness HPI Description: ADMISSION 02/18/2023 This is an 82 year old still very active man who was referred to [place] by Dr. Ardelle Anton at [facility]. He has a nonhealing wound on the plantar aspect of his right heel. He has been dealing with this since June when he presented to Dr. Loreta Ave with a painful area on the right mid heel. He was not really sure how this happened. He ultimately had an MRI in July that showed no osteomyelitis but there was a small abscess he was treated with antibiotics doxycycline and gradually the pain improved but he still been left with a small open area. He had an ABI on 11/14/2022 that was 1.08 on the right and 0.99 on the left. He by enlarge has been using Silvadene on the wound. Recently changed to silver alginate. He also developed a blister on the right anterior lower leg. Recent x-rays have not shown any cortical changes. Also in June he had a sedimentation rate of 2 and a C-reactive protein of 14 not suggestive of chronic active infection Past medical history includes type 2 diabetes, nonischemic cardiomyopathy, hypertension cholesterolemia, skin cancer, combined congestive heart failure, paroxysmal A-fib, idiopathic trigeminal neuralgia. 10/1; ulcer on the plantar aspect of his right heel. He is being compliant with the healing boot. We have been using Hydrofera Blue heel cup gauze. Dimensions are better 10/7; the area on the leg right anterior is healed. He has chronic  insufficiency. Unfortunately the ulcer on the plantar aspect of his right heel looks about the same each time with undermining the skin is not adherent to the underlying tissue. This requires debridement. We have been using Hydrofera Blue, heel cup and heel offloading shoe. We are not making progress with the heel ulcer. 10/15; I brought the patient in today with the intention of putting him in a total contact cast if the wound on his plantar heel on the right was no better. However he comes in with erythema spreading towards the tip of his heel. There is also bogginess. There is no crepitus. He is a very active man. He is in the heel off loader but I do not think for 1 reason or another this is doing the job in terms of pressure and friction relief. I am concerned enough about the wound today not to put on a total contact cast 10/22; culture I did last week showed Staphylococcus epidermidis which is likely a skin contaminant but he also grew Staphylococcus capitis which was resistant to the doxycycline I put him on. I am going to give him a week of trimethoprim/sulfamethoxazole for this. I am not sure of the relevance of the coag negative staph however I do not want to put a cast and something that is infected. We have been using Iodoflex 10/29; patient with a right plantar heel wound. This was initially probably infectious with an MRI showing a small abscess but no osteomyelitis. 2 weeks ago he had erythema around the wound and bogginess but no crepitus I treated him with a prolonged course of antibiotics last week giving and will further week of Septra. We  have been using mupirocin and silver alginate. The wound is measuring smaller today but there is undermining at 0.9 cm almost circumferentially 11/7; right plantar heel. This was not any smaller this week with considerable relative undermining almost circumferentially. This required debridement. I wanted to put this in a total contact cast but the  patient was in a sling for a gene that we would not have been able to get off over the cast. 11/18; small wound with significant relative depth. We have been using endoform and we will put a total contact cast on him for the first time today. 11/20; in for the first obligatory total contact cast we have been using endoform small punched-out area on the right plantar heel seems stable to improved 04/22/2023: There is a fair amount of undermining created by some skin and callus. There is some slough on the surface. He does have a small abrasion on his anterior tibial surface and dorsal foot from his cast. 12/3; wound on the right plantar heel. Again he comes in with undermining this is not a new phenomenon. We've been using endoform and a TCC. 12/10; right plantar heel. Again significant undermining maceration of the skin. All of this had to be removed. Fortunately the wound does not look infected however clearly not improving 12/19; right plantar heel. Not much improvement in the condition of the wound but it is not worse. He has an undermining area from about 6-9 o'clock about half a centimeter. More worrisome this the granulation tissue is not adherent. There is no palpable bone. However there is still considerable relative drainage. Electronic Signature(s) Signed: 05/15/2023 5:18:32 PM By: [name] MD Entered By: [name] on 05/15/2023 16:03:54 [name] ([id number]) [id number] Page 2 of 7 -------------------------------------------------------------------------------- Physical Exam Details Patient Name: Date of Service: TO Erik [name]. 05/15/2023 2:00 PM Medical Record Number: [id number] Patient Account Number: [id number] Date of Birth/Sex: Treating RN: [birth year] (82 y.o. M) Primary Care Provider: Guerry Bruin Other Clinician: Referring Provider: Treating Provider/Extender: Wyline Beady in Treatment:  12 Constitutional Patient is hypertensive.. Pulse regular and within target [name] for patient.[name] Respirations regular, non-labored and within target [name].. Temperature is normal and within the target [name] for the patient.[name] Appears in no distress. Notes Wound exam; there is seems to be less maceration although still considerable drainage. The undermining seems better than last time but at least around the wound orifice the granulation is not adherent. This does not probe to bone. There is no obvious infection Electronic Signature(s) Signed: 05/15/2023 5:18:32 PM By: [name] MD Entered By: [name] on 05/15/2023 16:07:09 -------------------------------------------------------------------------------- Physician Orders Details Patient Name: Date of Service: TO Erik [name], [name] 05/15/2023 2:00 PM Medical Record Number: [id number] Patient Account Number: [id number] Date of Birth/Sex: Treating RN: [birth year] (82 y.o. 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 - Dr Lady Gary covering Dr. Leanord Hawking 05/22/2023 145pm (already scheduled) Return A ppointment in 2 weeks. - Dr. Leanord Hawking 05/29/2023 130pm (already scheduled) Return A Return appointment in  3 weeks. - *******Dr. Mikey Bussing (front office to schedule) Return appointment in 1 month. - *******Dr. Mikey Bussing (front office to  schedule) Anesthetic Wound #1 Right Calcaneus (In clinic) Topical Lidocaine 4% applied to wound bed Bathing/ Shower/ Hygiene May shower with protection but do not get wound dressing(s) wet. Protect dressing(s) with water repellant cover (for example, large plastic bag) or a cast cover and may then take shower. - purchase cast protector from [facility], [facility] or [employer] Off-Loading Total Contact Cast to Right Lower Extremity - size 3 Wound Treatment Wound #1 - Calcaneus Wound Laterality: Right ORVAN, WILMOTT ([id number]) [id number] Page 3 of 7 Cleanser: Soap and Water 1 x Per Week/30 Days Discharge Instructions: May shower and wash wound with dial antibacterial soap and water prior to dressing change. Cleanser: Vashe 5.8 (oz) 1 x Per Week/30 Days Discharge Instructions: Cleanse the wound with Vashe prior to applying a clean dressing using gauze sponges, not tissue or cotton balls. Peri-Wound Care: Zinc Oxide Ointment 30g tube 1 x Per Week/30 Days Discharge Instructions: Apply Zinc Oxide to periwound with each dressing change Prim Dressing: IODOFLEX 0.9% Cadexomer Iodine Pad 4x6 cm 1 x Per Week/30 Days ary Discharge Instructions: Apply to wound bed as instructed Secondary Dressing: ABD Pad, 8x10 1 x Per Week/30 Days Discharge Instructions: Apply over primary dressing as directed. Secondary Dressing: Zetuvit Plus 4x8 in 1 x Per Week/30 Days Discharge Instructions: Apply over primary dressing as directed. Secondary Dressing: T Contact Cast 1 x Per Week/30 Days otal Discharge Instructions: last layer applied by provider. Secondary Dressing: kerlix and tape 1 x Per Week/30 Days Electronic Signature(s) Signed: 05/15/2023 5:18:32 PM By: [name] MD Signed: 05/15/2023 6:09:01 PM By: Shawn Stall RN, BSN Entered By: Shawn Stall on 05/15/2023 15:03:43 -------------------------------------------------------------------------------- Problem List  Details Patient Name: Date of Service: TO Erik [name], [name] 05/15/2023 2:00 PM Medical Record Number: [id number] Patient Account Number: [id number] Date of Birth/Sex: Treating RN: 01/28/1941 (82 y.o. [name] Primary Care Provider: Guerry Bruin Other Clinician: Referring Provider: Treating Provider/Extender: Wyline Beady in Treatment: 12 Active Problems ICD-10 Encounter Code Description Active Date MDM Diagnosis 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 02/18/2023 No Yes severity E11.621 Type 2 diabetes mellitus with foot ulcer 02/18/2023 No Yes I87.331 Chronic venous hypertension (idiopathic) with ulcer and inflammation of right 02/18/2023 No Yes lower extremity Inactive Problems ICD-10 Code Description Active Date Inactive Date L97.811 Non-pressure chronic ulcer of other part of right lower leg limited to breakdown of skin 02/18/2023 02/18/2023 [name] ([id number]) [id number] Page 4 of 7 Resolved Problems Electronic Signature(s) Signed: 05/15/2023 5:18:32 PM By: [name] MD Entered By: [name] on 05/15/2023 16:02:36 -------------------------------------------------------------------------------- Progress Note Details Patient Name: Date of Service: TO Erik [name], [name] 05/15/2023 2:00 PM Medical Record Number: [id number] Patient Account Number: [id number] Date of Birth/Sex: Treating RN: [birth year] (82 y.o. M) Primary Care Provider: Guerry Bruin Other Clinician: Referring Provider: Treating Provider/Extender: Wyline Beady in Treatment: 12 Subjective History of Present Illness (HPI) ADMISSION 02/18/2023 This is an 82 year old still very active man who was referred to [place] by Dr. Ardelle Anton at [facility]. He has a nonhealing wound on the plantar aspect of his right heel. He has been dealing with this since June when he  presented to Dr. Loreta Ave with a painful area on the right mid heel. He was not really sure how this happened. He ultimately had an MRI in July  that showed no osteomyelitis but there was a small abscess he was treated with antibiotics doxycycline and gradually the pain improved but he still been left with a small open area. He had an ABI on 11/14/2022 that was 1.08 on the right and 0.99 on the left. He by enlarge has been using Silvadene on the wound. Recently changed to silver alginate. He also developed a blister on the right anterior lower leg. Recent x-rays have not shown any cortical changes. Also in June he had a sedimentation rate of 2 and a C-reactive protein of 14 not suggestive of chronic active infection Past medical history includes type 2 diabetes, nonischemic cardiomyopathy, hypertension cholesterolemia, skin cancer, combined congestive heart failure, paroxysmal A-fib, idiopathic trigeminal neuralgia. 10/1; ulcer on the plantar aspect of his right heel. He is being compliant with the healing boot. We have been using Hydrofera Blue heel cup gauze. Dimensions are better 10/7; the area on the leg right anterior is healed. He has chronic insufficiency. Unfortunately the ulcer on the plantar aspect of his right heel looks about the same each time with undermining the skin is not adherent to the underlying tissue. This requires debridement. We have been using Hydrofera Blue, heel cup and heel offloading shoe. We are not making progress with the heel ulcer. 10/15; I brought the patient in today with the intention of putting him in a total contact cast if the wound on his plantar heel on the right was no better. However he comes in with erythema spreading towards the tip of his heel. There is also bogginess. There is no crepitus. He is a very active man. He is in the heel off loader but I do not think for 1 reason or another this is doing the job in terms of pressure and friction relief. I am  concerned enough about the wound today not to put on a total contact cast 10/22; culture I did last week showed Staphylococcus epidermidis which is likely a skin contaminant but he also grew Staphylococcus capitis which was resistant to the doxycycline I put him on. I am going to give him a week of trimethoprim/sulfamethoxazole for this. I am not sure of the relevance of the coag negative staph however I do not want to put a cast and something that is infected. We have been using Iodoflex 10/29; patient with a right plantar heel wound. This was initially probably infectious with an MRI showing a small abscess but no osteomyelitis. 2 weeks ago he had erythema around the wound and bogginess but no crepitus I treated him with a prolonged course of antibiotics last week giving and will further week of Septra. We have been using mupirocin and silver alginate. The wound is measuring smaller today but there is undermining at 0.9 cm almost circumferentially 11/7; right plantar heel. This was not any smaller this week with considerable relative undermining almost circumferentially. This required debridement. I wanted to put this in a total contact cast but the patient was in a sling for a gene that we would not have been able to get off over the cast. 11/18; small wound with significant relative depth. We have been using endoform and we will put a total contact cast on him for the first time today. 11/20; in for the first obligatory total contact cast we have been using endoform small punched-out area on the right plantar heel seems stable to improved 04/22/2023: There is a fair amount of undermining created by some skin and callus. There is  some slough on the surface. He does have a small abrasion on his anterior tibial surface and dorsal foot from his cast. 12/3; wound on the right plantar heel. Again he comes in with undermining this is not a new phenomenon. We've been using endoform and a TCC. 12/10; right  plantar heel. Again significant undermining maceration of the skin. All of this had to be removed. Fortunately the wound does not look infected however clearly not improving 12/19; right plantar heel. Not much improvement in the condition of the wound but it is not worse. He has an undermining area from about 6-9 o'clock about half a centimeter. More worrisome this the granulation tissue is not adherent. There is no palpable bone. However there is still considerable relative drainage. KENAAN, RUETER ([id number]) [id number] Page 5 of 7 Objective Constitutional Patient is hypertensive.. Pulse regular and within target [name] for patient.[name] Respirations regular, non-labored and within target [name].. Temperature is normal and within the target [name] for the patient.[name] Appears in no distress. Vitals Time Taken: 2:21 PM, Height: 71 in, Weight: 193 lbs, BMI: 26.9, Temperature: 98.1 F, Pulse: 78 bpm, Respiratory Rate: 20 breaths/min, Blood Pressure: 150/57 mmHg, Capillary Blood Glucose: 81 mg/dl. General Notes: Wound exam; there is seems to be less maceration although still considerable drainage. The undermining seems better than last time but at least around the wound orifice the granulation is not adherent. This does not probe to bone. There is no obvious infection Integumentary (Hair, Skin) Wound #1 status is Open. Original cause of wound was Gradually Appeared. The date acquired was: 01/21/2023. The wound has been in treatment 12 weeks. The wound is located on the Right Calcaneus. The wound measures 0.7cm length x 0.8cm width x 0.4cm depth; 0.44cm^2 area and 0.176cm^3 volume. There is Fat Layer (Subcutaneous Tissue) exposed. There is no tunneling noted, however, there is undermining starting at 3:00 and ending at 9:00 with a maximum distance of 0.7cm. There is a medium amount of serosanguineous drainage noted. The wound margin is distinct with the outline attached to the wound  base. There is medium (34-66%) pink, pale granulation within the wound bed. There is a medium (34-66%) amount of necrotic tissue within the wound bed including Adherent Slough. The periwound skin appearance had no abnormalities noted for color. The periwound skin appearance exhibited: Callus, Maceration. Periwound temperature was noted as No Abnormality. 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 There was a T Contact Cast otal Procedure by Maxwell Caul., MD. Post procedure Diagnosis Wound #1: Same as Pre-Procedure Notes: size 3. Plan Follow-up Appointments: Return Appointment in 1 week. - Dr Lady Gary covering Dr. Leanord Hawking 05/22/2023 145pm (already scheduled) Return Appointment in 2 weeks. - Dr. Leanord Hawking 05/29/2023 130pm (already scheduled) Return appointment in 3 weeks. - *******Dr. Mikey Bussing (front office to schedule) Return appointment in 1 month. - *******Dr. Mikey Bussing (front office to schedule) Anesthetic: Wound #1 Right Calcaneus: (In clinic) Topical Lidocaine 4% applied to wound bed Bathing/ Shower/ Hygiene: May shower with protection but do not get wound dressing(s) wet. Protect dressing(s) with water repellant cover (for example, large plastic bag) or a cast cover and may then take shower. - purchase cast protector from [facility], [facility] or [employer] Off-Loading: T  Contact Cast to Right Lower Extremity - size 3 otal WOUND #1: - Calcaneus Wound Laterality: Right Cleanser: Soap and Water 1 x Per Week/30 Days Discharge Instructions: May shower and wash wound with dial antibacterial soap and water prior to dressing change. Cleanser: Vashe 5.8 (oz) 1  x Per Week/30 Days Discharge Instructions: Cleanse the wound with Vashe prior to applying a clean dressing using gauze sponges, not tissue or cotton balls. Peri-Wound Care: Zinc Oxide Ointment 30g tube 1 x Per Week/30 Days Discharge Instructions: Apply Zinc Oxide to periwound with each dressing change Prim Dressing: IODOFLEX 0.9% Cadexomer Iodine Pad 4x6 cm 1 x Per Week/30 Days ary Discharge Instructions: Apply to wound bed as instructed Secondary Dressing: ABD Pad, 8x10 1 x Per Week/30 Days Discharge Instructions: Apply over primary dressing as directed. Secondary Dressing: Zetuvit Plus 4x8 in 1 x Per Week/30 Days Discharge Instructions: Apply over primary dressing as directed. Secondary Dressing: T Contact Cast 1 x Per Week/30 Days otal Discharge Instructions: last layer applied by provider. Secondary Dressing: kerlix and tape 1 x Per Week/30 Days KAYHAN, SOUFFRONT ([id number]) [id number] Page 6 of 7 1. I change the dressing to Iodoflex in this wound. The issue is trying to get adherence of the subcutaneous granulation to the overlying skin tissue. I have sometimes seen Iodoflex to this although this is admittedly anecdotal 2. Backing Zetuvit/ABDs and we replace the total contact cast 3. Fortunately this does not probe to bone, there is drainage but no obvious infection Electronic Signature(s) Signed: 05/15/2023 5:18:32 PM By: [name] MD Entered By: [name] on 05/15/2023 16:09:12 -------------------------------------------------------------------------------- Total Contact Cast Details Patient Name: Date of Service: TO Erik [name] NK W. 05/15/2023 2:00 PM Medical Record Number: [id number] Patient Account Number: [id number] Date of Birth/Sex: Treating RN: 19-Jan-1941 (82 y.o. M) Primary Care Provider: Guerry Bruin Other Clinician: Referring Provider: Treating Provider/Extender: Wyline Beady in Treatment: 12 T  Contact Cast Applied for Wound Assessment: otal Wound #1 Right Calcaneus Performed By: Physician Maxwell Caul., MD The following information was scribed by: Shawn Stall The information was scribed for: [name] Post Procedure Diagnosis Same as Pre-procedure Notes size 3 Electronic Signature(s) Signed: 05/15/2023 5:18:32 PM By: [name] MD Entered By: [name] on 05/15/2023 16:02:52 -------------------------------------------------------------------------------- SuperBill Details Patient Name: Date of Service: TO [name] 05/15/2023 Medical Record Number: [id number] Patient Account Number: [id number] Date of Birth/Sex: Treating RN: 21-Jun-1940 (82 y.o. 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 E11.621 Type 2 diabetes mellitus with foot ulcer I87.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 Facility Procedures : LUCA, BARTIK Code: [id number] SAADIQ OLMSTED ([id number] Description: [phone] - APPLY TOTAL CONTACT LEG CAST ICD-10 Diagnosis Description 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

## 2023-05-16 NOTE — Progress Notes (Signed)
IOSIF, SURACE ([id number]) [id number] Page 1 of 7 Visit Report for 05/15/2023 Arrival Information Details Patient Name: Date of Service: TO Erik [name] NK W. 05/15/2023 2:00 PM Medical Record Number: [id number] Patient Account Number: [id number] Date of Birth/Sex: Treating RN: 10/28/1940 (82 y.o. [name], [name] Primary Care Eero Dini: Guerry Bruin Other Clinician: Referring Cashus Halterman: Treating Annastasia Haskins/Extender: Wyline Beady in Treatment: 12 Visit Information History Since Last Visit Added or deleted any medications: No Patient Arrived: Dan Humphreys Any new allergies or adverse reactions: No Arrival Time: 15:55 Had a fall or experienced change in No Accompanied By: self activities of daily living that may affect Transfer Assistance: None risk of falls: Patient Requires Transmission-Based Precautions: No Signs or symptoms of abuse/neglect since last visito No Patient Has Alerts: Yes Hospitalized since last visit: No Patient Alerts: ABI: R 1.08 11/14/22 Implantable device outside of the clinic excluding No cellular tissue based products placed in the center since last visit: Has Dressing in Place as Prescribed: Yes Has Footwear/Offloading in Place as Prescribed: Yes Right: T Contact Cast otal Pain Present Now: No Electronic Signature(s) Signed: 05/15/2023 6:09:01 PM By: Shawn Stall RN, BSN Entered By: Shawn Stall on 05/15/2023 14:21:25 -------------------------------------------------------------------------------- Encounter Discharge Information Details Patient Name: Date of Service: TO Erik [name], [name] 05/15/2023 2:00 PM Medical Record Number: [id number] Patient Account Number: [id number] Date of Birth/Sex: Treating RN: 02/14/1941 (82 y.o. Erik [name] Primary Care Kainat Pizana: Guerry Bruin Other Clinician: Referring Nazia Rhines: Treating Xzaviar Maloof/Extender: Wyline Beady in Treatment:  12 Encounter Discharge Information Items Discharge Condition: Stable Ambulatory Status: Walker Discharge Destination: Home Transportation: Private Auto Accompanied By: self Schedule Follow-up Appointment: Yes Clinical Summary of Care: Electronic Signature(s) Signed: 05/15/2023 6:09:01 PM By: Shawn Stall RN, BSN Entered By: Shawn Stall on 05/15/2023 15:04:46 Erik [name] ([id number]) [id number] Page 2 of 7 -------------------------------------------------------------------------------- Lower Extremity Assessment Details Patient Name: Date of Service: TO Erik [name]. 05/15/2023 2:00 PM Medical Record Number: [id number] Patient Account Number: [id number] Date of Birth/Sex: Treating RN: May 06, 1941 (82 y.o. Erik [name] Primary Care Trystian Crisanto: Guerry Bruin Other Clinician: Referring Mckynna Vanloan: Treating Yashvi Jasinski/Extender: Wyline Beady in Treatment: 12 Edema Assessment Assessed: [name]: No] [name]: Yes] Edema: [Left: N] [Right: o] Calf Left: Right: Point of Measurement: From Medial Instep 36 cm Ankle Left: Right: Point of Measurement: From Medial Instep 26 cm Vascular Assessment Pulses: Dorsalis Pedis Palpable: [Right:Yes] Extremity colors, hair growth, and conditions: Extremity Color: [Right:Normal] Hair Growth on Extremity: [Right:No] Temperature of Extremity: [Right:Warm] Capillary Refill: [Right:< 3 seconds] Dependent Rubor: [Right:No] Blanched when Elevated: [Right:No Yes] Toe Nail Assessment Left: Right: Thick: Yes Discolored: Yes Deformed: Yes Improper Length and Hygiene: No Electronic Signature(s) Signed: 05/15/2023 6:09:01 PM By: Shawn Stall RN, BSN Entered By: Shawn Stall on 05/15/2023 14:21:56 -------------------------------------------------------------------------------- Multi Wound Chart Details Patient Name: Date of Service: TO Erik [name], [name] 05/15/2023 2:00 PM Medical Record  Number: [id number] Patient Account Number: [id number] Date of Birth/Sex: Treating RN: September 21, 1940 (82 y.o. M) Primary Care Kortez Murtagh: Guerry Bruin Other Clinician: Referring Dezirea Mccollister: Treating Byard Carranza/Extender: Wyline Beady in Treatment: 12 Vital Signs Height(in): 71 Capillary Blood Glucose(mg/dl): 81 Weight(lbs): 536 Pulse(bpm): 78 Body Mass Index(BMI): 26.9 Blood Pressure(mmHg): 150/57 Temperature(F): 98.1 Erik [name], Erik [name] ([id number]) [phone].pdf Page 3 of 7 Respiratory Rate(breaths/min): 20 [1:Photos:] [N/A:N/A] Right Calcaneus N/A N/A Wound Location: Gradually Appeared N/A N/A Wounding Event: Diabetic Wound/Ulcer of the Lower N/A N/A Primary Etiology: Extremity Anemia, Arrhythmia, Coronary Artery N/A N/A  Comorbid History: Disease, Hypertension, Peripheral Arterial Disease, Type II Diabetes, Neuropathy 01/21/2023 N/A N/A Date Acquired: 12 N/A N/A Weeks of Treatment: Open N/A N/A Wound Status: No N/A N/A Wound Recurrence: 0.7x0.8x0.4 N/A N/A Measurements L x W x D (cm) 0.44 N/A N/A A (cm) : rea 0.176 N/A N/A Volume (cm) : -212.10% N/A N/A % Reduction in A rea: -528.60% N/A N/A % Reduction in Volume: 3 Starting Position 1 (o'clock): 9 Ending Position 1 (o'clock): 0.7 Maximum Distance 1 (cm): Yes N/A N/A Undermining: Grade 2 N/A N/A Classification: Medium N/A N/A Exudate A mount: Serosanguineous N/A N/A Exudate Type: red, brown N/A N/A Exudate Color: Distinct, outline attached N/A N/A Wound Margin: Medium (34-66%) N/A N/A Granulation A mount: Pink, Pale N/A N/A Granulation Quality: Medium (34-66%) N/A N/A Necrotic A mount: Fat Layer (Subcutaneous Tissue): Yes N/A N/A Exposed Structures: Fascia: No Tendon: No Muscle: No Joint: No Bone: No Small (1-33%) N/A N/A Epithelialization: Callus: Yes N/A N/A Periwound Skin Texture: Maceration: Yes N/A N/A Periwound Skin Moisture: No  Abnormalities Noted N/A N/A Periwound Skin Color: No Abnormality N/A N/A Temperature: T Contact Cast otal N/A N/A Procedures Performed: Treatment Notes Wound #1 (Calcaneus) Wound Laterality: Right Cleanser Soap and Water Discharge Instruction: May shower and wash wound with dial antibacterial soap and water prior to dressing change. Vashe 5.8 (oz) Discharge Instruction: Cleanse the wound with Vashe prior to applying a clean dressing using gauze sponges, not tissue or cotton balls. Peri-Wound Care Zinc Oxide Ointment 30g tube Discharge Instruction: Apply Zinc Oxide to periwound with each dressing change Topical Primary Dressing IODOFLEX 0.9% Cadexomer Iodine Pad 4x6 cm Discharge Instruction: Apply to wound bed as instructed Secondary Dressing ABD Pad, 8x10 Discharge Instruction: Apply over primary dressing as directed. Zetuvit Plus 4x8 in Erik [name], Erik [name] ([id number]) [id number].pdf Page 4 of 7 Discharge Instruction: Apply over primary dressing as directed. T Contact Cast otal Discharge Instruction: last layer applied by Doni Widmer. kerlix and tape Secured With Compression Wrap Compression Stockings Add-Ons Electronic Signature(s) Signed: 05/15/2023 5:18:32 PM By: [name] MD Entered By: [name] on 05/15/2023 16:02:42 -------------------------------------------------------------------------------- Multi-Disciplinary Care Plan Details Patient Name: Date of Service: TO Erik [name], [name] 05/15/2023 2:00 PM Medical Record Number: [id number] Patient Account Number: [id number] Date of Birth/Sex: Treating RN: [birth year] (82 y.o. Erik [name] Primary Care Dimond Crotty: Guerry Bruin Other Clinician: Referring Jaque Dacy: Treating Oaklen Thiam/Extender: Wyline Beady in Treatment: 12 Multidisciplinary Care Plan reviewed with physician Active Inactive Wound/Skin Impairment Nursing Diagnoses: Impaired tissue  integrity Knowledge deficit related to ulceration/compromised skin integrity Goals: Patient/caregiver will verbalize understanding of skin care regimen Date Initiated: 02/18/2023 Target Resolution Date: 07/25/2023 Goal Status: Active Ulcer/skin breakdown will have a volume reduction of 30% by week 4 Date Initiated: 02/18/2023 Target Resolution Date: 07/25/2023 Goal Status: Active Interventions: Assess patient/caregiver ability to obtain necessary supplies Assess patient/caregiver ability to perform ulcer/skin care regimen upon admission and as needed Assess ulceration(s) every visit Provide education on ulcer and skin care Notes: Electronic Signature(s) Signed: 05/15/2023 6:09:01 PM By: Shawn Stall RN, BSN Entered By: Shawn Stall on 05/15/2023 14:23:12 Erik [name] ([id number]) [id number] Page 5 of 7 -------------------------------------------------------------------------------- Pain Assessment Details Patient Name: Date of Service: TO Erik [name]. 05/15/2023 2:00 PM Medical Record Number: [id number] Patient Account Number: [id number] Date of Birth/Sex: Treating RN: 28-Jul-1940 (82 y.o. Erik [name] Primary Care Eisley Barber: Guerry Bruin Other Clinician: Referring Zayyan Mullen: Treating Tegan Britain/Extender: Wyline Beady in Treatment: 12 Active Problems Location of  Pain Severity and Description of Pain Patient Has Paino No Site Locations Pain Management and Medication Current Pain Management: Electronic Signature(s) Signed: 05/15/2023 6:09:01 PM By: Shawn Stall RN, BSN Entered By: Shawn Stall on 05/15/2023 14:21:43 -------------------------------------------------------------------------------- Patient/Caregiver Education Details Patient Name: Date of Service: TO Erik [name]. 12/19/2024andnbsp2:00 PM Medical Record Number: [id number] Patient Account Number: [id number] Date of Birth/Gender: Treating  RN: [birth year] (82 y.o. Erik [name] Primary Care Physician: Guerry Bruin Other Clinician: Referring Physician: Treating Physician/Extender: Wyline Beady in Treatment: 12 Education Assessment Education Provided To: Patient Education Topics Provided Wound Debridement: Handouts: Wound Debridement Methods: Explain/Verbal Responses: Reinforcements needed, State content correctly Electronic Signature(s) Signed: 05/15/2023 6:09:01 PM By: Shawn Stall RN, BSN Erik [name] ([id number]) [id number] Page 6 of 7 Entered By: Shawn Stall on 05/15/2023 14:23:39 -------------------------------------------------------------------------------- Wound Assessment Details Patient Name: Date of Service: TO Erik [name]. 05/15/2023 2:00 PM Medical Record Number: [id number] Patient Account Number: [id number] Date of Birth/Sex: Treating RN: 04/09/1941 (82 y.o. [name], [name] Primary Care Jayten Gabbard: Guerry Bruin Other Clinician: Referring Naba Sneed: Treating Tanice Petre/Extender: Wyline Beady in Treatment: 12 Wound Status Wound Number: 1 Primary Diabetic Wound/Ulcer of the Lower Extremity Etiology: Wound Location: Right Calcaneus Wound Open Wounding Event: Gradually Appeared Status: Date Acquired: 01/21/2023 Comorbid Anemia, Arrhythmia, Coronary Artery Disease, Hypertension, Weeks Of Treatment: 12 History: Peripheral Arterial Disease, Type II Diabetes, Neuropathy Clustered Wound: No Photos Wound Measurements Length: (cm) 0.7 Width: (cm) 0.8 Depth: (cm) 0.4 Area: (cm) 0.44 Volume: (cm) 0.176 % Reduction in Area: -212.1% % Reduction in Volume: -528.6% Epithelialization: Small (1-33%) Tunneling: No Undermining: Yes Starting Position (o'clock): 3 Ending Position (o'clock): 9 Maximum Distance: (cm) 0.7 Wound Description Classification: Grade 2 Wound Margin: Distinct, outline attached Exudate  Amount: Medium Exudate Type: Serosanguineous Exudate Color: red, brown Foul Odor After Cleansing: No Slough/Fibrino Yes Wound Bed Granulation Amount: Medium (34-66%) Exposed Structure Granulation Quality: Pink, Pale Fascia Exposed: No Necrotic Amount: Medium (34-66%) Fat Layer (Subcutaneous Tissue) Exposed: Yes Necrotic Quality: Adherent Slough Tendon Exposed: No Muscle Exposed: No Joint Exposed: No Bone Exposed: No Periwound Skin Texture Texture Color No Abnormalities Noted: No No Abnormalities Noted: Yes Callus: Yes Temperature / Pain Temperature: No Abnormality Moisture No Abnormalities Noted: No Erik [name], Erik [name] ([id number]) [id number] Page 7 of 7 Maceration: Yes Treatment Notes Wound #1 (Calcaneus) Wound Laterality: Right Cleanser Soap and Water Discharge Instruction: May shower and wash wound with dial antibacterial soap and water prior to dressing change. Vashe 5.8 (oz) Discharge Instruction: Cleanse the wound with Vashe prior to applying a clean dressing using gauze sponges, not tissue or cotton balls. Peri-Wound Care Zinc Oxide Ointment 30g tube Discharge Instruction: Apply Zinc Oxide to periwound with each dressing change Topical Primary Dressing IODOFLEX 0.9% Cadexomer Iodine Pad 4x6 cm Discharge Instruction: Apply to wound bed as instructed Secondary Dressing ABD Pad, 8x10 Discharge Instruction: Apply over primary dressing as directed. Zetuvit Plus 4x8 in Discharge Instruction: Apply over primary dressing as directed. T Contact Cast otal Discharge Instruction: last layer applied by Vela Render. kerlix and tape Secured With Compression Wrap Compression Stockings Facilities manager) Signed: 05/15/2023 6:09:01 PM By: Shawn Stall RN, BSN Entered By: Shawn Stall on 05/15/2023 14:24:02 -------------------------------------------------------------------------------- Vitals Details Patient Name: Date of Service: TO  Erik [name], [name] 05/15/2023 2:00 PM Medical Record Number: [id number] Patient Account Number: [id number] Date of Birth/Sex: Treating RN: [birth year] (82 y.o. Erik [name] Primary Care Alli Jasmer: Guerry Bruin Other Clinician: Referring Aaryana Betke: Treating  Kathelyn Gombos/Extender: Wyline Beady in Treatment: 12 Vital Signs Time Taken: 14:21 Temperature (F): 98.1 Height (in): 71 Pulse (bpm): 78 Weight (lbs): 193 Respiratory Rate (breaths/min): 20 Body Mass Index (BMI): 26.9 Blood Pressure (mmHg): 150/57 Capillary Blood Glucose (mg/dl): 81 Reference [name]: 80 - 120 mg / dl Electronic Signature(s) Signed: 05/15/2023 6:09:01 PM By: Shawn Stall RN, BSN Entered By: Shawn Stall on 05/15/2023 14:22:01

## 2023-05-22 ENCOUNTER — Encounter (HOSPITAL_BASED_OUTPATIENT_CLINIC_OR_DEPARTMENT_OTHER): Payer: PPO | Admitting: General Surgery

## 2023-05-22 DIAGNOSIS — E11621 Type 2 diabetes mellitus with foot ulcer: Secondary | ICD-10-CM | POA: Diagnosis not present

## 2023-05-22 DIAGNOSIS — L97412 Non-pressure chronic ulcer of right heel and midfoot with fat layer exposed: Secondary | ICD-10-CM | POA: Diagnosis not present

## 2023-05-22 NOTE — Progress Notes (Signed)
LISTER, CAPELLE ([id number]) [id number] Page 1 of 8 Visit Report for 05/22/2023 Chief Complaint Document Details Patient Name: Date of Service: TO Erik [name]. 05/22/2023 1:45 PM Medical Record Number: [id number] Patient Account Number: [id number] Date of Birth/Sex: Treating RN: 06/11/1940 (82 y.o. M) Primary Care Provider: Guerry Bruin Other Clinician: Referring Provider: Treating Provider/Extender: [name] in Treatment: 13 Information Obtained from: Patient Chief Complaint 02/18/2023; patient is here for review of a wound in the right mid plantar foot Electronic Signature(s) Signed: 05/22/2023 2:10:51 PM By: Duanne Guess MD FACS Entered By: Duanne Guess on 05/22/2023 11:04:16 -------------------------------------------------------------------------------- Debridement Details Patient Name: Date of Service: TO [name] 05/22/2023 1:45 PM Medical Record Number: [id number] Patient Account Number: [id number] Date of Birth/Sex: Treating RN: 02/05/1941 (82 y.o. [name] Primary Care Provider: Guerry Bruin Other Clinician: Referring Provider: Treating Provider/Extender: [name] in Treatment: 13 Debridement Performed for Assessment: Wound #1 Right Calcaneus Performed By: Physician Duanne Guess, MD The following information was scribed by: Shawn Stall The information was scribed for: Duanne Guess Debridement Type: Debridement Severity of Tissue Pre Debridement: Fat layer exposed Level of Consciousness (Pre-procedure): Awake and Alert Pre-procedure Verification/Time Out Yes - 13:55 Taken: Start Time: 13:56 Pain Control: Lidocaine 4% T opical Solution Percent of Wound Bed Debrided: 150% T Area Debrided (cm): otal 0.58 Tissue and other material debrided: Viable, Non-Viable, Slough, Subcutaneous, Skin: Dermis , Skin: Epidermis, Slough Level:  Skin/Subcutaneous Tissue Debridement Description: Excisional Instrument: Curette Bleeding: Minimum Hemostasis Achieved: Pressure End Time: 14:00 Procedural Pain: 0 Post Procedural Pain: 0 Response to Treatment: Procedure was tolerated well Level of Consciousness (Post- Awake and Alert procedure): Post Debridement Measurements of Total Wound Length: (cm) 0.7 Width: (cm) 0.7 Depth: (cm) 0.3 LIGE, BLANCHETTE ([id number]) [id number] Page 2 of 8 Volume: (cm) 0.115 Character of Wound/Ulcer Post Debridement: Improved Severity of Tissue Post Debridement: Fat layer exposed Post Procedure Diagnosis Same as Pre-procedure Electronic Signature(s) Signed: 05/22/2023 2:10:51 PM By: Duanne Guess MD FACS Signed: 05/22/2023 3:30:05 PM By: Shawn Stall RN, BSN Entered By: Shawn Stall on 05/22/2023 11:01:34 -------------------------------------------------------------------------------- HPI Details Patient Name: Date of Service: TO [name] 05/22/2023 1:45 PM Medical Record Number: [id number] Patient Account Number: [id number] Date of Birth/Sex: Treating RN: 06/02/1940 (82 y.o. M) Primary Care Provider: Guerry Bruin Other Clinician: Referring Provider: Treating Provider/Extender: [name] in Treatment: 13 History of Present Illness HPI Description: ADMISSION 02/18/2023 This is an 82 year old still very active man who was referred to [place] by Dr. Ardelle Anton at [facility]. He has a nonhealing wound on the plantar aspect of his right heel. He has been dealing with this since June when he presented to Dr. Loreta Ave with a painful area on the right mid heel. He was not really sure how this happened. He ultimately had an MRI in July that showed no osteomyelitis but there was a small abscess he was treated with antibiotics doxycycline and gradually the pain improved but he still been left with a small open area. He had an ABI on  11/14/2022 that was 1.08 on the right and 0.99 on the left. He by enlarge has been using Silvadene on the wound. Recently changed to silver alginate. He also developed a blister on the right anterior lower leg. Recent x-rays have not shown any cortical changes. Also in June he had a sedimentation rate of 2 and a C-reactive protein of 14 not suggestive of chronic  active infection Past medical history includes type 2 diabetes, nonischemic cardiomyopathy, hypertension cholesterolemia, skin cancer, combined congestive heart failure, paroxysmal A-fib, idiopathic trigeminal neuralgia. 10/1; ulcer on the plantar aspect of his right heel. He is being compliant with the healing boot. We have been using Hydrofera Blue heel cup gauze. Dimensions are better 10/7; the area on the leg right anterior is healed. He has chronic insufficiency. Unfortunately the ulcer on the plantar aspect of his right heel looks about the same each time with undermining the skin is not adherent to the underlying tissue. This requires debridement. We have been using Hydrofera Blue, heel cup and heel offloading shoe. We are not making progress with the heel ulcer. 10/15; I brought the patient in today with the intention of putting him in a total contact cast if the wound on his plantar heel on the right was no better. However he comes in with erythema spreading towards the tip of his heel. There is also bogginess. There is no crepitus. He is a very active man. He is in the heel off loader but I do not think for 1 reason or another this is doing the job in terms of pressure and friction relief. I am concerned enough about the wound today not to put on a total contact cast 10/22; culture I did last week showed Staphylococcus epidermidis which is likely a skin contaminant but he also grew Staphylococcus capitis which was resistant to the doxycycline I put him on. I am going to give him a week of trimethoprim/sulfamethoxazole for this. I am  not sure of the relevance of the coag negative staph however I do not want to put a cast and something that is infected. We have been using Iodoflex 10/29; patient with a right plantar heel wound. This was initially probably infectious with an MRI showing a small abscess but no osteomyelitis. 2 weeks ago he had erythema around the wound and bogginess but no crepitus I treated him with a prolonged course of antibiotics last week giving and will further week of Septra. We have been using mupirocin and silver alginate. The wound is measuring smaller today but there is undermining at 0.9 cm almost circumferentially 11/7; right plantar heel. This was not any smaller this week with considerable relative undermining almost circumferentially. This required debridement. I wanted to put this in a total contact cast but the patient was in a sling for a gene that we would not have been able to get off over the cast. 11/18; small wound with significant relative depth. We have been using endoform and we will put a total contact cast on him for the first time today. 11/20; in for the first obligatory total contact cast we have been using endoform small punched-out area on the right plantar heel seems stable to improved 04/22/2023: There is a fair amount of undermining created by some skin and callus. There is some slough on the surface. He does have a small abrasion on his anterior tibial surface and dorsal foot from his cast. 12/3; wound on the right plantar heel. Again he comes in with undermining this is not a new phenomenon. We've been using endoform and a TCC. 12/10; right plantar heel. Again significant undermining maceration of the skin. All of this had to be removed. Fortunately the wound does not look infected however clearly not improving 12/19; right plantar heel. Not much improvement in the condition of the wound but it is not worse. He has an undermining area from about  6-9 o'clock about half a  centimeter. More worrisome this the granulation tissue is not adherent. There is no palpable bone. However there is still considerable relative drainage. LANCELOT, LAPEYROUSE ([id number]) [id number] Page 3 of 8 05/22/2023: Last week, Iodoflex was placed in the wound to try and address the undermined area. This seems to have been effective, as I do not identify any undermined portion of the wound. There is slough on the wound surface. He still has a fair amount of drainage that has resulted in some mild periwound tissue maceration. Electronic Signature(s) Signed: 05/22/2023 2:10:51 PM By: Duanne Guess MD FACS Entered By: Duanne Guess on 05/22/2023 11:05:33 -------------------------------------------------------------------------------- Physical Exam Details Patient Name: Date of Service: TO [name] 05/22/2023 1:45 PM Medical Record Number: [id number] Patient Account Number: [id number] Date of Birth/Sex: Treating RN: November 26, 1940 (82 y.o. M) Primary Care Provider: Guerry Bruin Other Clinician: Referring Provider: Treating Provider/Extender: [name] in Treatment: 13 Constitutional Slightly hypertensive. . . . no acute distress. Respiratory Normal work of breathing on room air.. Notes 05/22/2023: I do not identify any undermined portion of the wound. There is slough on the wound surface. He still has a fair amount of drainage that has resulted in some mild periwound tissue maceration. Electronic Signature(s) Signed: 05/22/2023 2:10:51 PM By: Duanne Guess MD FACS Entered By: Duanne Guess on 05/22/2023 11:06:21 -------------------------------------------------------------------------------- Physician Orders Details Patient Name: Date of Service: TO [name] 05/22/2023 1:45 PM Medical Record Number: [id number] Patient Account Number: [id number] Date of Birth/Sex: Treating RN: 1940-12-24 (82 y.o. 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 - Dr. Leanord Hawking 05/29/2023 130pm (already scheduled) Return A ppointment in 2 weeks. - Dr Mikey Bussing 06/05/2023 230pm (already scheduled) Return A Return appointment in 3 weeks. - Dr. Mikey Bussing 06/12/2023 130pm (already scheduled) Anesthetic SAVINO, TRUESDELL ([id number]) [id number] Page 4 of 8 Wound #1 Right Calcaneus (In clinic) Topical Lidocaine 4% applied to wound bed Bathing/ Shower/ Hygiene May shower with protection but do not get wound dressing(s) wet. Protect dressing(s) with water repellant cover (for example, large plastic bag) or a cast cover and may then take shower. - purchase cast protector from [facility], [facility] or [employer] Off-Loading Total Contact Cast to Right Lower Extremity - size 3 Wound Treatment Wound #1 - Calcaneus Wound Laterality: Right Cleanser: Soap and Water 1 x Per Week/30 Days Discharge Instructions: May shower and wash wound with dial antibacterial soap and water prior to dressing change. Cleanser: Vashe 5.8 (oz) 1 x Per Week/30 Days Discharge Instructions: Cleanse the wound with Vashe prior to applying a clean dressing using gauze sponges, not tissue or cotton balls. Peri-Wound Care: Zinc Oxide Ointment 30g tube 1 x Per Week/30 Days Discharge Instructions: Apply Zinc Oxide to periwound with each dressing  change Prim Dressing: IODOFLEX 0.9% Cadexomer Iodine Pad 4x6 cm 1 x Per Week/30 Days ary Discharge Instructions: Apply to wound bed as instructed Secondary Dressing: ABD Pad, 8x10 1 x Per Week/30 Days Discharge Instructions: Apply over primary dressing as  directed. Secondary Dressing: Zetuvit Plus 4x8 in 1 x Per Week/30 Days Discharge Instructions: Apply over primary dressing as directed. Secondary Dressing: T Contact Cast 1 x Per Week/30 Days otal Discharge Instructions: last layer applied by provider. Secondary Dressing: kerlix and tape 1 x Per Week/30 Days Electronic Signature(s) Signed: 05/22/2023 2:10:51 PM By: Duanne Guess MD FACS Entered By: Duanne Guess on 05/22/2023 11:06:47 -------------------------------------------------------------------------------- Problem List Details Patient Name: Date of Service: TO [name] 05/22/2023 1:45 PM Medical Record Number: [id number] Patient Account Number: [id number] Date of Birth/Sex: Treating RN: 08-14-1940 (82 y.o. [name] Primary Care Provider: Guerry Bruin Other Clinician: Referring Provider: Treating Provider/Extender: [name] in Treatment: 13 Active Problems ICD-10 Encounter Code Description Active Date MDM Diagnosis 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 02/18/2023 No Yes severity E11.621 Type 2 diabetes mellitus with foot ulcer 02/18/2023 No Yes I87.331 Chronic venous hypertension (idiopathic) with ulcer and inflammation of right 02/18/2023 No Yes lower extremity TAKAYUKI, KIMBLER ([id number]) [id number] Page 5 of 8 Inactive Problems ICD-10 Code Description Active Date Inactive Date L97.811 Non-pressure chronic ulcer of other part of right lower leg limited to breakdown of skin 02/18/2023 02/18/2023 Resolved Problems Electronic Signature(s) Signed: 05/22/2023 2:10:51 PM By: Duanne Guess MD  FACS Entered By: Duanne Guess on 05/22/2023 11:04:02 -------------------------------------------------------------------------------- Progress Note Details Patient Name: Date of Service: TO [name] 05/22/2023 1:45 PM Medical Record Number: [id number] Patient Account Number: [id number] Date of Birth/Sex: Treating RN: [birth year] (82 y.o. M) Primary Care Provider: Guerry Bruin Other Clinician: Referring Provider: Treating Provider/Extender: [name] in Treatment: 13 Subjective Chief Complaint Information obtained from Patient 02/18/2023; patient is here for review of a wound in the right mid plantar foot History of Present Illness (HPI) ADMISSION 02/18/2023 This is an 82 year old still very active man who was referred to [place] by Dr. Ardelle Anton at [facility]. He has a nonhealing wound on the plantar aspect of his right heel. He has been dealing with this since June when he presented to Dr. Loreta Ave with a painful area on the right mid heel. He was not really sure how this happened. He ultimately had an MRI in July that showed no osteomyelitis but there was a small abscess he was treated with antibiotics doxycycline and gradually the pain improved but he still been left with a small open area. He had an ABI on 11/14/2022 that was 1.08 on the right and 0.99 on the left. He by enlarge has been using Silvadene on the wound. Recently changed to silver alginate. He also developed a blister on the right anterior lower leg. Recent x-rays have not shown any cortical changes. Also in June he had a sedimentation rate of 2 and a C-reactive protein of 14 not suggestive of chronic active infection Past medical history includes type 2 diabetes, nonischemic cardiomyopathy, hypertension cholesterolemia, skin cancer, combined congestive heart failure, paroxysmal A-fib, idiopathic trigeminal neuralgia. 10/1; ulcer on the plantar aspect of his right heel. He is being  compliant with the healing boot. We have been using Hydrofera Blue heel cup gauze. Dimensions are better 10/7; the area on the leg right anterior is healed. He has chronic insufficiency. Unfortunately the ulcer on the plantar aspect of his right heel looks about the same each time with undermining the skin is not adherent to the underlying tissue. This requires debridement. We have been using Hydrofera Blue, heel cup and heel offloading shoe.  We are not making progress with the heel ulcer. 10/15; I brought the patient in today with the intention of putting him in a total contact cast if the wound on his plantar heel on the right was no better. However he comes in with erythema spreading towards the tip of his heel. There is also bogginess. There is no crepitus. He is a very active man. He is in the heel off loader but I do not think for 1 reason or another this is doing the job in terms of pressure and friction relief. I am concerned enough about the wound today not to put on a total contact cast 10/22; culture I did last week showed Staphylococcus epidermidis which is likely a skin contaminant but he also grew Staphylococcus capitis which was resistant to the doxycycline I put him on. I am going to give him a week of trimethoprim/sulfamethoxazole for this. I am not sure of the relevance of the coag negative staph however I do not want to put a cast and something that is infected. We have been using Iodoflex 10/29; patient with a right plantar heel wound. This was initially probably infectious with an MRI showing a small abscess but no osteomyelitis. 2 weeks ago he had erythema around the wound and bogginess but no crepitus I treated him with a prolonged course of antibiotics last week giving and will further week of Septra. We have been using mupirocin and silver alginate. The wound is measuring smaller today but there is undermining at 0.9 cm almost circumferentially 11/7; right plantar heel. This  was not any smaller this week with considerable relative undermining almost circumferentially. This required debridement. I wanted to put this in a total contact cast but the patient was in a sling for a gene that we would not have been able to get off over the cast. 11/18; small wound with significant relative depth. We have been using endoform and we will put a total contact cast on him for the first time today. 11/20; in for the first obligatory total contact cast we have been using endoform small punched-out area on the right plantar heel seems stable to improved 04/22/2023: There is a fair amount of undermining created by some skin and callus. There is some slough on the surface. He does have a small abrasion on KONSTANTY, SEIDNER ([id number]) [id number] Page 6 of 8 his anterior tibial surface and dorsal foot from his cast. 12/3; wound on the right plantar heel. Again he comes in with undermining this is not a new phenomenon. We've been using endoform and a TCC. 12/10; right plantar heel. Again significant undermining maceration of the skin. All of this had to be removed. Fortunately the wound does not look infected however clearly not improving 12/19; right plantar heel. Not much improvement in the condition of the wound but it is not worse. He has an undermining area from about 6-9 o'clock about half a centimeter. More worrisome this the granulation tissue is not adherent. There is no palpable bone. However there is still considerable relative drainage. 05/22/2023: Last week, Iodoflex was placed in the wound to try and address the undermined area. This seems to have been effective, as I do not identify any undermined portion of the wound. There is slough on the wound surface. He still has a fair amount of drainage that has resulted in some mild periwound tissue maceration. Objective Constitutional Slightly hypertensive. no acute distress. Vitals Time Taken: 1:35 PM,  Height: 71 in, Weight: 193  lbs, BMI: 26.9, T emperature: 97.7 F, Pulse: 77 bpm, Respiratory Rate: 20 breaths/min, Blood Pressure: 146/69 mmHg, Capillary Blood Glucose: 73 mg/dl. General Notes: per patient has not ate lunch only breakfast. Patient drinking a caffeine free pepsi. Explained how regular soda is not good use to to elevate and sustain the blood sugar. Patient continues to drink this to raise blood glucose. Explained at length to ensure he eats, offered a glucerna or peanut butter with crackers. Patient declined. Educated patient to order to heal wounds need to keep blood glucose stable, high and low does not aid in wound healing. Patient in agreement. Provider made aware. Respiratory Normal work of breathing on room air.. General Notes: 05/22/2023: I do not identify any undermined portion of the wound. There is slough on the wound surface. He still has a fair amount of drainage that has resulted in some mild periwound tissue maceration. Integumentary (Hair, Skin) Wound #1 status is Open. Original cause of wound was Gradually Appeared. The date acquired was: 01/21/2023. The wound has been in treatment 13 weeks. The wound is located on the Right Calcaneus. The wound measures 0.7cm length x 0.7cm width x 0.3cm depth; 0.385cm^2 area and 0.115cm^3 volume. There is Fat Layer (Subcutaneous Tissue) exposed. There is no tunneling or undermining noted. There is a medium amount of serosanguineous drainage noted. The wound margin is distinct with the outline attached to the wound base. There is large (67-100%) pink, pale granulation within the wound bed. There is a small (1- 33%) amount of necrotic tissue within the wound bed including Adherent Slough. The periwound skin appearance had no abnormalities noted for color. The periwound skin appearance exhibited: Callus, Maceration. Periwound temperature was noted as No Abnormality. Assessment Active Problems ICD-10 Non-pressure chronic ulcer of right  heel and midfoot with other specified severity Type 2 diabetes mellitus with foot ulcer Chronic venous hypertension (idiopathic) with ulcer and inflammation of right lower extremity Procedures Wound #1 Pre-procedure diagnosis of Wound #1 is a Diabetic Wound/Ulcer of the Lower Extremity located on the Right Calcaneus .Severity of Tissue Pre Debridement is: Fat layer exposed. There was a Excisional Skin/Subcutaneous Tissue Debridement with a total area of 0.58 sq cm performed by Duanne Guess, MD. With the following instrument(s): Curette to remove Viable and Non-Viable tissue/material. Material removed includes Subcutaneous Tissue, Slough, Skin: Dermis, and Skin: Epidermis after achieving pain control using Lidocaine 4% T opical Solution. A time out was conducted at 13:55, prior to the start of the procedure. A Minimum amount of bleeding was controlled with Pressure. The procedure was tolerated well with a pain level of 0 throughout and a pain level of 0 following the procedure. Post Debridement Measurements: 0.7cm length x 0.7cm width x 0.3cm depth; 0.115cm^3 volume. Character of Wound/Ulcer Post Debridement is improved. Severity of Tissue Post Debridement is: Fat layer exposed. Post procedure Diagnosis Wound #1: Same as Pre-Procedure Pre-procedure diagnosis of Wound #1 is a Diabetic Wound/Ulcer of the Lower Extremity located on the Right Calcaneus . There was a T Contact Cast otal Procedure by Duanne Guess, MD. Post procedure Diagnosis Wound #1: Same as Pre-Procedure Notes: size 3. GRAYCIN, DELROSSO ([id number]) [id number] Page 7 of 8 Plan Follow-up Appointments: Return Appointment in 1 week. - Dr. Leanord Hawking 05/29/2023 130pm (already scheduled) Return Appointment in 2 weeks. - Dr Mikey Bussing 06/05/2023 230pm (already scheduled) Return appointment in 3 weeks. - Dr. Mikey Bussing 06/12/2023 130pm (already scheduled) Anesthetic: Wound #1 Right Calcaneus: (In clinic) Topical  Lidocaine 4% applied to wound bed Bathing/ Shower/  Hygiene: May shower with protection but do not get wound dressing(s) wet. Protect dressing(s) with water repellant cover (for example, large plastic bag) or a cast cover and may then take shower. - purchase cast protector from [facility], [facility] or [employer] Off-Loading: T Contact Cast to Right Lower Extremity - size 3 otal WOUND #1: - Calcaneus Wound Laterality: Right Cleanser: Soap and Water 1 x Per Week/30 Days Discharge Instructions: May shower and wash wound with dial antibacterial soap and water prior to dressing change. Cleanser: Vashe 5.8 (oz) 1 x Per Week/30 Days Discharge Instructions: Cleanse the wound with Vashe prior to applying a clean dressing using gauze sponges, not tissue or cotton balls. Peri-Wound Care: Zinc Oxide Ointment 30g tube 1 x Per Week/30 Days Discharge Instructions: Apply Zinc Oxide to periwound with each dressing change Prim Dressing: IODOFLEX 0.9% Cadexomer Iodine Pad 4x6 cm 1 x Per Week/30 Days ary Discharge Instructions: Apply to wound bed as instructed Secondary Dressing: ABD Pad, 8x10 1 x Per Week/30 Days Discharge Instructions: Apply over primary dressing as directed. Secondary Dressing: Zetuvit Plus 4x8 in 1 x Per Week/30 Days Discharge Instructions: Apply over primary dressing as directed. Secondary Dressing: T Contact Cast 1 x Per Week/30 Days otal Discharge Instructions: last layer applied by provider. Secondary Dressing: kerlix and tape 1 x Per Week/30 Days 05/22/2023: Last week, Iodoflex was placed in the wound to try and address the undermined area. This seems to have been effective, as I do not identify any undermined portion of the wound. There is slough on the wound surface. He still has a fair amount of drainage that has resulted in some mild periwound tissue maceration. I used a curette to debride slough, skin, and subcutaneous tissue from the wound. Iodoflex did well for him last week so I am  going to continue that, as per report, previous contact layers have not been particularly effective. He was reminded of the importance of leg elevation whenever he is sitting or lying down. A total contact cast was reapplied. He will follow-up in 1 week. Electronic Signature(s) Signed: 05/22/2023 2:10:51 PM By: Duanne Guess MD FACS Entered By: Duanne Guess on 05/22/2023 11:09:03 -------------------------------------------------------------------------------- Total Contact Cast Details Patient Name: Date of Service: TO Erik [name] 05/22/2023 1:45 PM Medical Record Number: [id number] Patient Account Number: [id number] Date of Birth/Sex: Treating RN: 03/28/1941 (82 y.o. [name] Primary Care Provider: Guerry Bruin Other Clinician: Referring Provider: Treating Provider/Extender: [name] in Treatment: 13 T Contact Cast Applied for Wound Assessment: otal Wound #1 Right Calcaneus Performed By: Physician Duanne Guess, MD The following information was scribed by: Shawn Stall The information was scribed for: Duanne Guess Post Procedure Diagnosis Same as Pre-procedure Notes size 3 Electronic Signature(s) Signed: 05/22/2023 2:10:51 PM By: Duanne Guess MD FACS Signed: 05/22/2023 3:30:05 PM By: Shawn Stall RN, BSN Entered By: Shawn Stall on 05/22/2023 10:59:47 [name] ([id number]) [id number] Page 8 of 8 -------------------------------------------------------------------------------- SuperBill Details Patient Name: Date of Service: TO Erik [name]. 05/22/2023 Medical Record Number: [id number] Patient Account Number: [id number] Date of Birth/Sex: Treating RN: 26-Oct-1940 (82 y.o. 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 seve E11.621 Type 2 diabetes mellitus with foot ulcer I87.331 Chronic venous hypertension  (idiopathic) with ulcer and inflammation of right Modifier: rity lower extremity Quantity: 1 Physician Procedures : CPT4 Code Description Modifier [id number] 11042 - WC PHYS SUBQ TISS 20 SQ CM ICD-10 Diagnosis Description 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

## 2023-05-22 NOTE — Progress Notes (Signed)
CAMARIE, REGEN ([id number]) [id number] Page 1 of 8 Visit Report for 05/22/2023 Arrival Information Details Patient Name: Date of Service: TO [name] 05/22/2023 1:45 PM Medical Record Number: [id number] Patient Account Number: [id number] Date of Birth/Sex: Treating RN: 02/11/1941 (82 y.o. [name], [name] Primary Care Mariel Lukins: Guerry Bruin Other Clinician: Referring Kialee Kham: Treating Hollynn Garno/Extender: [name] in Treatment: 13 Visit Information History Since Last Visit Added or deleted any medications: No Patient Arrived: Ambulatory Any new allergies or adverse reactions: No Arrival Time: 13:34 Had a fall or experienced change in No Accompanied By: self activities of daily living that may affect Transfer Assistance: None risk of falls: Patient Identification Verified: Yes Signs or symptoms of abuse/neglect since last visito No Secondary Verification Process Completed: Yes Hospitalized since last visit: No Patient Requires Transmission-Based Precautions: No Implantable device outside of the clinic excluding No Patient Has Alerts: Yes cellular tissue based products placed in the center Patient Alerts: ABI: R 1.08 11/14/22 since last visit: Has Dressing in Place as Prescribed: Yes Has Footwear/Offloading in Place as Prescribed: Yes Right: T Contact Cast otal Pain Present Now: No Electronic Signature(s) Signed: 05/22/2023 3:30:05 PM By: Shawn Stall RN, BSN Entered By: Shawn Stall on 05/22/2023 10:34:23 -------------------------------------------------------------------------------- Encounter Discharge Information Details Patient Name: Date of Service: TO [name], [name] 05/22/2023 1:45 PM Medical Record Number: [id number] Patient Account Number: [id number] Date of Birth/Sex: Treating RN: 1940-07-23 (82 y.o. [name] Primary Care Lenetta Piche: Guerry Bruin Other Clinician: Referring  Merritt Mccravy: Treating Naomee Nowland/Extender: [name] in Treatment: 13 Encounter Discharge Information Items Post Procedure Vitals Discharge Condition: Stable Temperature (F): 97.7 Ambulatory Status: Cane Pulse (bpm): 77 Discharge Destination: Home Respiratory Rate (breaths/min): 20 Transportation: Private Auto Blood Pressure (mmHg): 149/69 Accompanied By: self Schedule Follow-up Appointment: Yes Clinical Summary of Care: Electronic Signature(s) Signed: 05/22/2023 3:30:05 PM By: Shawn Stall RN, BSN Entered By: Shawn Stall on 05/22/2023 11:03:56 [name] ([id number]) [id number] Page 2 of 8 -------------------------------------------------------------------------------- Lower Extremity Assessment Details Patient Name: Date of Service: TO [name] 05/22/2023 1:45 PM Medical Record Number: [id number] Patient Account Number: [id number] Date of Birth/Sex: Treating RN: 1941-01-13 (82 y.o. [name] Primary Care Saidah Kempton: Guerry Bruin Other Clinician: Referring Linea Calles: Treating Jhamari Markowicz/Extender: [name] in Treatment: 13 Edema Assessment Assessed: [name]: No] [name]: Yes] Edema: [Left: N] [Right: o] Calf Left: Right: Point of Measurement: From Medial Instep 40 cm Ankle Left: Right: Point of Measurement: From Medial Instep 26 cm Vascular Assessment Pulses: Dorsalis Pedis Palpable: [Right:Yes] Extremity colors, hair growth, and conditions: Extremity Color: [Right:Normal] Hair Growth on Extremity: [Right:No] Temperature of Extremity: [Right:Warm] Capillary Refill: [Right:< 3 seconds] Dependent Rubor: [Right:No] Blanched when Elevated: [Right:No Yes] Toe Nail Assessment Left: Right: Thick: Yes Discolored: Yes Deformed: Yes Improper Length and Hygiene: No Electronic Signature(s) Signed: 05/22/2023 3:30:05 PM By: Shawn Stall RN, BSN Entered By: Shawn Stall  on 05/22/2023 10:43:35 -------------------------------------------------------------------------------- Multi Wound Chart Details Patient Name: Date of Service: TO [name] 05/22/2023 1:45 PM Medical Record Number: [id number] Patient Account Number: [id number] Date of Birth/Sex: Treating RN: [birth year] (82 y.o. M) Primary Care Jacklin Zwick: Guerry Bruin Other Clinician: Referring Shontelle Muska: Treating Ripken Rekowski/Extender: [name] in Treatment: 13 Vital Signs Height(in): 71 Capillary Blood Glucose(mg/dl): 73 Weight(lbs): 536 Pulse(bpm): 77 Body Mass Index(BMI): 26.9 Blood Pressure(mmHg): 146/69 Temperature(F): 97.7 BRITNEY, BETT ([id number]) [phone].pdf Page 3 of 8 Respiratory Rate(breaths/min): 20 [1:Photos:] [N/A:N/A] Right Calcaneus  N/A N/A Wound Location: Gradually Appeared N/A N/A Wounding Event: Diabetic Wound/Ulcer of the Lower N/A N/A Primary Etiology: Extremity Anemia, Arrhythmia, Coronary Artery N/A N/A Comorbid History: Disease, Hypertension, Peripheral Arterial Disease, Type II Diabetes, Neuropathy 01/21/2023 N/A N/A Date Acquired: 13 N/A N/A Weeks of Treatment: Open N/A N/A Wound Status: No N/A N/A Wound Recurrence: 0.7x0.7x0.3 N/A N/A Measurements L x W x D (cm) 0.385 N/A N/A A (cm) : rea 0.115 N/A N/A Volume (cm) : -173.00% N/A N/A % Reduction in A rea: -310.70% N/A N/A % Reduction in Volume: Grade 2 N/A N/A Classification: Medium N/A N/A Exudate A mount: Serosanguineous N/A N/A Exudate Type: red, brown N/A N/A Exudate Color: Distinct, outline attached N/A N/A Wound Margin: Large (67-100%) N/A N/A Granulation A mount: Pink, Pale N/A N/A Granulation Quality: Small (1-33%) N/A N/A Necrotic A mount: Fat Layer (Subcutaneous Tissue): Yes N/A N/A Exposed Structures: Fascia: No Tendon: No Muscle: No Joint: No Bone: No Medium (34-66%) N/A N/A Epithelialization: Debridement  - Excisional N/A N/A Debridement: Pre-procedure Verification/Time Out 13:55 N/A N/A Taken: Lidocaine 4% Topical Solution N/A N/A Pain Control: Subcutaneous, Slough N/A N/A Tissue Debrided: Skin/Subcutaneous Tissue N/A N/A Level: 0.58 N/A N/A Debridement A (sq cm): rea Curette N/A N/A Instrument: Minimum N/A N/A Bleeding: Pressure N/A N/A Hemostasis A chieved: 0 N/A N/A Procedural Pain: 0 N/A N/A Post Procedural Pain: Procedure was tolerated well N/A N/A Debridement Treatment Response: 0.7x0.7x0.3 N/A N/A Post Debridement Measurements L x W x D (cm) 0.115 N/A N/A Post Debridement Volume: (cm) Callus: Yes N/A N/A Periwound Skin Texture: Maceration: Yes N/A N/A Periwound Skin Moisture: No Abnormalities Noted N/A N/A Periwound Skin Color: No Abnormality N/A N/A Temperature: Debridement N/A N/A Procedures Performed: T Contact Cast otal Treatment Notes Wound #1 (Calcaneus) Wound Laterality: Right Cleanser Soap and Water Discharge Instruction: May shower and wash wound with dial antibacterial soap and water prior to dressing change. Vashe 5.8 (oz) Discharge Instruction: Cleanse the wound with Vashe prior to applying a clean dressing using gauze sponges, not tissue or cotton balls. Peri-Wound Care Zinc Oxide Ointment 30g tube Discharge Instruction: Apply Zinc Oxide to periwound with each dressing change KHYAIRE, MOGREN ([id number]) [phone].pdf Page 4 of 8 Topical Primary Dressing IODOFLEX 0.9% Cadexomer Iodine Pad 4x6 cm Discharge Instruction: Apply to wound bed as instructed Secondary Dressing ABD Pad, 8x10 Discharge Instruction: Apply over primary dressing as directed. Zetuvit Plus 4x8 in Discharge Instruction: Apply over primary dressing as directed. T Contact Cast otal Discharge Instruction: last layer applied by Denny Lave. kerlix and tape Secured With Compression Wrap Compression Stockings Add-Ons Electronic  Signature(s) Signed: 05/22/2023 2:10:51 PM By: Duanne Guess MD FACS Entered By: Duanne Guess on 05/22/2023 11:04:10 -------------------------------------------------------------------------------- Multi-Disciplinary Care Plan Details Patient Name: Date of Service: TO [name] 05/22/2023 1:45 PM Medical Record Number: [id number] Patient Account Number: [id number] Date of Birth/Sex: Treating RN: 1940-11-13 (82 y.o. [name] Primary Care Doretha Goding: Guerry Bruin Other Clinician: Referring Averie Meiner: Treating Duward Allbritton/Extender: [name] in Treatment: 13 Multidisciplinary Care Plan reviewed with physician Active Inactive Wound/Skin Impairment Nursing Diagnoses: Impaired tissue integrity Knowledge deficit related to ulceration/compromised skin integrity Goals: Patient/caregiver will verbalize understanding of skin care regimen Date Initiated: 02/18/2023 Target Resolution Date: 07/25/2023 Goal Status: Active Ulcer/skin breakdown will have a volume reduction of 30% by week 4 Date Initiated: 02/18/2023 Date Inactivated: 05/22/2023 Target Resolution Date: 07/25/2023 Unmet Reason: see wound Goal Status: Unmet measurement. Interventions: Assess patient/caregiver ability to obtain necessary supplies Assess patient/caregiver  ability to perform ulcer/skin care regimen upon admission and as needed Assess ulceration(s) every visit Provide education on ulcer and skin care Notes: SUPREME, CEFALO ([id number]) [phone].pdf Page 5 of 8 Electronic Signature(s) Signed: 05/22/2023 3:30:05 PM By: Shawn Stall RN, BSN Entered By: Shawn Stall on 05/22/2023 10:54:20 -------------------------------------------------------------------------------- Pain Assessment Details Patient Name: Date of Service: TO [name] 05/22/2023 1:45 PM Medical Record Number: [id number] Patient Account Number: [id number] Date of  Birth/Sex: Treating RN: September 18, 1940 (82 y.o. [name] Primary Care Treson Laura: Guerry Bruin Other Clinician: Referring Vanice Rappa: Treating Ocie Stanzione/Extender: [name] in Treatment: 13 Active Problems Location of Pain Severity and Description of Pain Patient Has Paino No Site Locations Pain Management and Medication Current Pain Management: Electronic Signature(s) Signed: 05/22/2023 3:30:05 PM By: Shawn Stall RN, BSN Entered By: Shawn Stall on 05/22/2023 10:38:56 -------------------------------------------------------------------------------- Patient/Caregiver Education Details Patient Name: Date of Service: TO [name] 12/26/2024andnbsp1:45 PM Medical Record Number: [id number] Patient Account Number: [id number] Date of Birth/Gender: Treating RN: 09-Aug-1940 (82 y.o. [name] Primary Care Physician: Guerry Bruin Other Clinician: Referring Physician: Treating Physician/Extender: [name] in Treatment: 13 Education Assessment Education Provided To: Patient GEORG, FONS ([id number]) [id number] Page 6 of 8 Education Topics Provided Wound/Skin Impairment: Handouts: Caring for Your Ulcer Methods: Explain/Verbal Responses: Reinforcements needed Electronic Signature(s) Signed: 05/22/2023 3:30:05 PM By: Shawn Stall RN, BSN Entered By: Shawn Stall on 05/22/2023 10:54:33 -------------------------------------------------------------------------------- Wound Assessment Details Patient Name: Date of Service: TO [name] 05/22/2023 1:45 PM Medical Record Number: [id number] Patient Account Number: [id number] Date of Birth/Sex: Treating RN: June 22, 1940 (82 y.o. [name], [name] Primary Care Rakan Soffer: Guerry Bruin Other Clinician: Referring Malvina Schadler: Treating Sophira Rumler/Extender: [name] in Treatment: 13 Wound Status Wound  Number: 1 Primary Diabetic Wound/Ulcer of the Lower Extremity Etiology: Wound Location: Right Calcaneus Wound Open Wounding Event: Gradually Appeared Status: Date Acquired: 01/21/2023 Comorbid Anemia, Arrhythmia, Coronary Artery Disease, Hypertension, Weeks Of Treatment: 13 History: Peripheral Arterial Disease, Type II Diabetes, Neuropathy Clustered Wound: No Photos Wound Measurements Length: (cm) 0.7 Width: (cm) 0.7 Depth: (cm) 0.3 Area: (cm) 0.385 Volume: (cm) 0.115 % Reduction in Area: -173% % Reduction in Volume: -310.7% Epithelialization: Medium (34-66%) Tunneling: No Undermining: No Wound Description Classification: Grade 2 Wound Margin: Distinct, outline attached Exudate Amount: Medium Exudate Type: Serosanguineous Exudate Color: red, brown Foul Odor After Cleansing: No Slough/Fibrino Yes Wound Bed Granulation Amount: Large (67-100%) Exposed Structure Granulation Quality: Pink, Pale Fascia Exposed: No Necrotic Amount: Small (1-33%) Fat Layer (Subcutaneous Tissue) Exposed: Yes Necrotic Quality: Adherent Slough Tendon Exposed: No Muscle Exposed: No Joint Exposed: No Bone Exposed: No UMAIR, GRADDICK ([id number]) [id number] Page 7 of 8 Periwound Skin Texture Texture Color No Abnormalities Noted: No No Abnormalities Noted: Yes Callus: Yes Temperature / Pain Temperature: No Abnormality Moisture No Abnormalities Noted: No Maceration: Yes Treatment Notes Wound #1 (Calcaneus) Wound Laterality: Right Cleanser Soap and Water Discharge Instruction: May shower and wash wound with dial antibacterial soap and water prior to dressing change. Vashe 5.8 (oz) Discharge Instruction: Cleanse the wound with Vashe prior to applying a clean dressing using gauze sponges, not tissue or cotton balls. Peri-Wound Care Zinc Oxide Ointment 30g tube Discharge Instruction: Apply Zinc Oxide to periwound with each dressing change Topical Primary  Dressing IODOFLEX 0.9% Cadexomer Iodine Pad 4x6 cm Discharge Instruction: Apply to wound bed as instructed Secondary Dressing ABD Pad, 8x10 Discharge Instruction: Apply over primary dressing as directed.  Zetuvit Plus 4x8 in Discharge Instruction: Apply over primary dressing as directed. T Contact Cast otal Discharge Instruction: last layer applied by Alonnah Lampkins. kerlix and tape Secured With Compression Wrap Compression Stockings Facilities manager) Signed: 05/22/2023 3:30:05 PM By: Shawn Stall RN, BSN Entered By: Shawn Stall on 05/22/2023 10:48:12 -------------------------------------------------------------------------------- Vitals Details Patient Name: Date of Service: TO [name] 05/22/2023 1:45 PM Medical Record Number: [id number] Patient Account Number: [id number] Date of Birth/Sex: Treating RN: 1940-09-28 (82 y.o. [name] Primary Care Ahriana Gunkel: Guerry Bruin Other Clinician: Referring Vaniyah Lansky: Treating Konya Fauble/Extender: [name] in Treatment: 13 Vital Signs Time Taken: 13:35 Temperature (F): 97.7 Height (in): 71 Pulse (bpm): 77 Weight (lbs): 193 Respiratory Rate (breaths/min): 20 Body Mass Index (BMI): 26.9 Blood Pressure (mmHg): 146/69 Capillary Blood Glucose (mg/dl): 73 Reference Range: 80 - 120 mg / dl GIOVANI, FEOLE ([id number]) [id number] Page 8 of 8 Notes per patient has not ate lunch only breakfast. Patient drinking a caffeine free pepsi. Explained how regular soda is not good use to to elevate and sustain the blood sugar. Patient continues to drink this to raise blood glucose. Explained at length to ensure he eats, offered a glucerna or peanut butter with crackers. Patient declined. Educated patient to order to heal wounds need to keep blood glucose stable, high and low does not aid in wound healing. Patient in agreement. Xerxes Agrusa made aware. Electronic  Signature(s) Signed: 05/22/2023 3:30:05 PM By: Shawn Stall RN, BSN Entered By: Shawn Stall on 05/22/2023 10:38:12

## 2023-05-29 ENCOUNTER — Encounter (HOSPITAL_BASED_OUTPATIENT_CLINIC_OR_DEPARTMENT_OTHER): Payer: PPO | Attending: Internal Medicine | Admitting: Internal Medicine

## 2023-05-29 DIAGNOSIS — E11621 Type 2 diabetes mellitus with foot ulcer: Secondary | ICD-10-CM | POA: Diagnosis not present

## 2023-05-29 DIAGNOSIS — E1151 Type 2 diabetes mellitus with diabetic peripheral angiopathy without gangrene: Secondary | ICD-10-CM | POA: Diagnosis not present

## 2023-05-29 DIAGNOSIS — I110 Hypertensive heart disease with heart failure: Secondary | ICD-10-CM | POA: Insufficient documentation

## 2023-05-29 DIAGNOSIS — L97412 Non-pressure chronic ulcer of right heel and midfoot with fat layer exposed: Secondary | ICD-10-CM | POA: Diagnosis not present

## 2023-05-29 DIAGNOSIS — L97418 Non-pressure chronic ulcer of right heel and midfoot with other specified severity: Secondary | ICD-10-CM | POA: Diagnosis not present

## 2023-05-29 DIAGNOSIS — I480 Paroxysmal atrial fibrillation: Secondary | ICD-10-CM | POA: Insufficient documentation

## 2023-05-29 DIAGNOSIS — I509 Heart failure, unspecified: Secondary | ICD-10-CM | POA: Diagnosis not present

## 2023-05-30 ENCOUNTER — Telehealth: Payer: Self-pay | Admitting: Neurology

## 2023-05-30 NOTE — Progress Notes (Signed)
 Erik [name], Erik [name] ([id number]) [id number] Page 1 of 7 Visit Report for 05/29/2023 HPI Details Patient Name: Date of Service: TO JUDGE LOISE ANTONIA HEATH [name]. 05/29/2023 1:30 PM Medical Record Number: [id number] Patient Account Number: [id number] Date of Birth/Sex: Treating RN: 1941-03-28 (83 y.o. M) Primary Care Provider: Tisovec, Richard Other Clinician: Referring Provider: Treating Provider/Extender: Rufus Ozell Vernadine Charlie Devra in Treatment: 14 History of Present Illness HPI Description: ADMISSION 02/18/2023 This is an 83 year old still very active man who was referred to us  by Dr. Gershon at [facility]. He has a nonhealing wound on the plantar aspect of his right heel. He has been dealing with this since June when he presented to Dr. Alona with a painful area on the right mid heel. He was not really sure how this happened. He ultimately had an MRI in July that showed no osteomyelitis but there was a small abscess he was treated with antibiotics doxycycline  and gradually the pain improved but he still been left with a small open area. He had an ABI on 11/14/2022 that was 1.08 on the right and 0.99 on the left. He by enlarge has been using Silvadene  on the wound. Recently changed to silver  alginate. He also developed a blister on the right anterior lower leg. Recent x-rays have not shown any cortical changes. Also in June he had a sedimentation rate of 2 and a C-reactive protein of 14 not suggestive of chronic active infection Past medical history includes type 2 diabetes, nonischemic cardiomyopathy, hypertension cholesterolemia, skin cancer, combined congestive heart failure, paroxysmal A-fib, idiopathic trigeminal neuralgia. 10/1; ulcer on the plantar aspect of his right heel. He is being compliant with the healing boot. We have been using Hydrofera Blue heel cup gauze. Dimensions are better 10/7; the area on the leg right anterior is healed. He has chronic  insufficiency. Unfortunately the ulcer on the plantar aspect of his right heel looks about the same each time with undermining the skin is not adherent to the underlying tissue. This requires debridement. We have been using Hydrofera Blue, heel cup and heel offloading shoe. We are not making progress with the heel ulcer. 10/15; I brought the patient in today with the intention of putting him in a total contact cast if the wound on his plantar heel on the right was no better. However he comes in with erythema spreading towards the tip of his heel. There is also bogginess. There is no crepitus. He is a very active man. He is in the heel off loader but I do not think for 1 reason or another this is doing the job in terms of pressure and friction relief. I am concerned enough about the wound today not to put on a total contact cast 10/22; culture I did last week showed Staphylococcus epidermidis which is likely a skin contaminant but he also grew Staphylococcus capitis which was resistant to the doxycycline  I put him on. I am going to give him a week of trimethoprim/sulfamethoxazole for this. I am not sure of the relevance of the coag negative staph however I do not want to put a cast and something that is infected. We have been using Iodoflex 10/29; patient with a right plantar heel wound. This was initially probably infectious with an MRI showing a small abscess but no osteomyelitis. 2 weeks ago he had erythema around the wound and bogginess but no crepitus I treated him with a prolonged course of antibiotics last week giving and will further week of Septra. We  have been using mupirocin  and silver  alginate. The wound is measuring smaller today but there is undermining at 0.9 cm almost circumferentially 11/7; right plantar heel. This was not any smaller this week with considerable relative undermining almost circumferentially. This required debridement. I wanted to put this in a total contact cast but the  patient was in a sling for a gene that we would not have been able to get off over the cast. 11/18; small wound with significant relative depth. We have been using endoform and we will put a total contact cast on him for the first time today. 11/20; in for the first obligatory total contact cast we have been using endoform small punched-out area on the right plantar heel seems stable to improved 04/22/2023: There is a fair amount of undermining created by some skin and callus. There is some slough on the surface. He does have a small abrasion on his anterior tibial surface and dorsal foot from his cast. 12/3; wound on the right plantar heel. Again he comes in with undermining this is not a new phenomenon. We've been using endoform and a TCC. 12/10; right plantar heel. Again significant undermining maceration of the skin. All of this had to be removed. Fortunately the wound does not look infected however clearly not improving 12/19; right plantar heel. Not much improvement in the condition of the wound but it is not worse. He has an undermining area from about 6-9 o'clock about half a centimeter. More worrisome this the granulation tissue is not adherent. There is no palpable bone. However there is still considerable relative drainage. 05/22/2023: Last week, Iodoflex was placed in the wound to try and address the undermined area. This seems to have been effective, as I do not identify any undermined portion of the wound. There is slough on the wound surface. He still has a fair amount of drainage that has resulted in some mild periwound tissue maceration. 05/29/2023; patient with a wound on his plantar heel. This has no undermining today. We have been using Iodoflex to address this and it looks like it has been successful. He continues to have drainage however in the periwound is macerated Electronic Signature(s) Signed: 05/29/2023 4:03:36 PM By: Rufus Sharper MD Entered By: Rufus Sharper on  05/29/2023 14:04:27 Erik [name] ([id number]) [id number] Page 2 of 7 -------------------------------------------------------------------------------- Physical Exam Details Patient Name: Date of Service: TO JUDGE LOISE ANTONIA HEATH [name]. 05/29/2023 1:30 PM Medical Record Number: [id number] Patient Account Number: [id number] Date of Birth/Sex: Treating RN: 1941/03/18 (83 y.o. M) Primary Care Provider: Tisovec, Richard Other Clinician: Referring Provider: Treating Provider/Extender: Rufus Sharper Vernadine Charlie Devra in Treatment: 14 Constitutional Sitting or standing Blood Pressure is within target range for patient.. Pulse regular and within target range for patient.SABRA Respirations regular, non-labored and within target range.. Temperature is normal and within the target range for the patient.SABRA Appears in no distress. Notes Wound exam; small punched-out area on the right plantar mid heel. Granulation tissue looks healthy. There is no undermining some periwound maceration. No evidence of infection Electronic Signature(s) Signed: 05/29/2023 4:03:36 PM By: Rufus Sharper MD Entered By: Rufus Sharper on 05/29/2023 14:05:54 -------------------------------------------------------------------------------- Physician Orders Details Patient Name: Date of Service: TO JUDGE LOISE, ANTONIA NK W. 05/29/2023 1:30 PM Medical Record Number: [id number] Patient Account Number: [id number] Date of Birth/Sex: Treating RN: 1941-01-02 (83 y.o. 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 - Dr Rosan 06/05/2023 230pm (already scheduled) Return A ppointment in 2 weeks. - Dr. Rosan 06/12/2023 130pm (already scheduled) Return A Return appointment in 3 weeks. - Dr. Rosan (front office to schedule) Anesthetic Wound #1 Right Calcaneus (In clinic) Topical Lidocaine  4% applied to wound bed Bathing/ Shower/ Hygiene May shower with protection but do not get wound dressing(s) wet. Protect dressing(s) with water repellant cover (for example, large plastic bag) or a cast cover and may then take shower. - purchase cast protector from [facility], [facility] or [employer] Off-Loading Total Contact Cast to Right Lower Extremity - size 3 Wound Treatment WILFRED, DAYRIT ([id number]) [id number] Page 3 of 7 Wound #1 - Calcaneus Wound Laterality: Right Cleanser: Soap and Water 1 x Per Week/30 Days Discharge Instructions: May shower and wash wound with dial antibacterial soap and water prior to dressing change. Cleanser: Vashe 5.8 (oz) 1 x Per Week/30 Days Discharge Instructions: Cleanse the wound with Vashe prior to applying a clean dressing using gauze sponges, not tissue or cotton balls. Peri-Wound Care: Zinc Oxide Ointment 30g tube 1 x Per Week/30 Days Discharge Instructions: Apply Zinc Oxide to periwound with each dressing change Prim Dressing: Maxorb Extra Ag+ Alginate Dressing, 2x2 (in/in) 1 x Per Week/30 Days ary Discharge Instructions: Apply to wound bed as instructed Secondary Dressing: ABD Pad, 8x10 1 x Per Week/30 Days Discharge Instructions: Apply over primary dressing as directed. Secondary Dressing: Zetuvit Plus 4x8 in 1 x Per Week/30 Days Discharge Instructions: Apply over primary dressing as directed. Secondary Dressing: T Contact Cast 1 x Per Week/30 Days otal Discharge  Instructions: last layer applied by provider. Secondary Dressing: kerlix and tape 1 x Per Week/30 Days Electronic Signature(s) Signed: 05/29/2023 4:03:36 PM By: Rufus Sharper MD Signed: 05/30/2023 12:56:04 PM By: Drury Nestle RN, BSN Entered By: Drury Nestle on 05/29/2023 13:59:14 -------------------------------------------------------------------------------- Problem List Details Patient Name: Date of Service: TO JUDGE SAILOR, ANTONIA CZAR W. 05/29/2023 1:30 PM Medical Record Number: [id number] Patient Account Number: [id number] Date of Birth/Sex: Treating RN: March 12, 1941 (83 y.o. NETTY Drury Nestle Primary Care Provider: Tisovec, Richard Other Clinician: Referring Provider: Treating Provider/Extender: Rufus Sharper Vernadine Charlie Devra in Treatment: 14 Active Problems ICD-10 Encounter Code Description Active Date MDM Diagnosis 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 02/18/2023 No Yes severity E11.621 Type 2 diabetes mellitus with foot ulcer 02/18/2023 No Yes I87.331 Chronic venous hypertension (idiopathic) with ulcer and inflammation of right 02/18/2023 No Yes lower extremity Inactive Problems ICD-10 Code Description Active Date Inactive Date L97.811 Non-pressure chronic ulcer of other part of right lower leg limited to breakdown of skin 02/18/2023 02/18/2023 Erik [name] ([id number]) [id number] Page 4 of 7 Resolved Problems Electronic Signature(s) Signed: 05/29/2023 4:03:36 PM By: Rufus Sharper MD Entered By: Rufus Sharper on 05/29/2023 14:02:20 -------------------------------------------------------------------------------- Progress Note Details Patient Name: Date of Service: TO JUDGE SAILOR, ANTONIA NK W. 05/29/2023 1:30 PM Medical Record Number: [id number] Patient Account Number: [id number] Date of Birth/Sex: Treating RN: [birth year] (83 y.o. M) Primary Care Provider: Tisovec, Richard Other Clinician: Referring  Provider: Treating Provider/Extender: Rufus Sharper Vernadine Charlie Devra in Treatment: 14 Subjective History of Present Illness (  HPI) ADMISSION 02/18/2023 This is an 83 year old still very active man who was referred to us  by Dr. Gershon at [facility]. He has a nonhealing wound on the plantar aspect of his right heel. He has been dealing with this since June when he presented to Dr. Alona with a painful area on the right mid heel. He was not really sure how this happened. He ultimately had an MRI in July that showed no osteomyelitis but there was a small abscess he was treated with antibiotics doxycycline  and gradually the pain improved but he still been left with a small open area. He had an ABI on 11/14/2022 that was 1.08 on the right and 0.99 on the left. He by enlarge has been using Silvadene  on the wound. Recently changed to silver  alginate. He also developed a blister on the right anterior lower leg. Recent x-rays have not shown any cortical changes. Also in June he had a sedimentation rate of 2 and a C-reactive protein of 14 not suggestive of chronic active infection Past medical history includes type 2 diabetes, nonischemic cardiomyopathy, hypertension cholesterolemia, skin cancer, combined congestive heart failure, paroxysmal A-fib, idiopathic trigeminal neuralgia. 10/1; ulcer on the plantar aspect of his right heel. He is being compliant with the healing boot. We have been using Hydrofera Blue heel cup gauze. Dimensions are better 10/7; the area on the leg right anterior is healed. He has chronic insufficiency. Unfortunately the ulcer on the plantar aspect of his right heel looks about the same each time with undermining the skin is not adherent to the underlying tissue. This requires debridement. We have been using Hydrofera Blue, heel cup and heel offloading shoe. We are not making progress with the heel ulcer. 10/15; I brought the patient in today with the intention of  putting him in a total contact cast if the wound on his plantar heel on the right was no better. However he comes in with erythema spreading towards the tip of his heel. There is also bogginess. There is no crepitus. He is a very active man. He is in the heel off loader but I do not think for 1 reason or another this is doing the job in terms of pressure and friction relief. I am concerned enough about the wound today not to put on a total contact cast 10/22; culture I did last week showed Staphylococcus epidermidis which is likely a skin contaminant but he also grew Staphylococcus capitis which was resistant to the doxycycline  I put him on. I am going to give him a week of trimethoprim/sulfamethoxazole for this. I am not sure of the relevance of the coag negative staph however I do not want to put a cast and something that is infected. We have been using Iodoflex 10/29; patient with a right plantar heel wound. This was initially probably infectious with an MRI showing a small abscess but no osteomyelitis. 2 weeks ago he had erythema around the wound and bogginess but no crepitus I treated him with a prolonged course of antibiotics last week giving and will further week of Septra. We have been using mupirocin  and silver  alginate. The wound is measuring smaller today but there is undermining at 0.9 cm almost circumferentially 11/7; right plantar heel. This was not any smaller this week with considerable relative undermining almost circumferentially. This required debridement. I wanted to put this in a total contact cast but the patient was in a sling for a gene that we would not have been able to  get off over the cast. 11/18; small wound with significant relative depth. We have been using endoform and we will put a total contact cast on him for the first time today. 11/20; in for the first obligatory total contact cast we have been using endoform small punched-out area on the right plantar heel seems  stable to improved 04/22/2023: There is a fair amount of undermining created by some skin and callus. There is some slough on the surface. He does have a small abrasion on his anterior tibial surface and dorsal foot from his cast. 12/3; wound on the right plantar heel. Again he comes in with undermining this is not a new phenomenon. We've been using endoform and a TCC. 12/10; right plantar heel. Again significant undermining maceration of the skin. All of this had to be removed. Fortunately the wound does not look infected however clearly not improving 12/19; right plantar heel. Not much improvement in the condition of the wound but it is not worse. He has an undermining area from about 6-9 o'clock about half a centimeter. More worrisome this the granulation tissue is not adherent. There is no palpable bone. However there is still considerable relative drainage. 05/22/2023: Last week, Iodoflex was placed in the wound to try and address the undermined area. This seems to have been effective, as I do not identify any undermined portion of the wound. There is slough on the wound surface. He still has a fair amount of drainage that has resulted in some mild periwound tissue maceration. 05/29/2023; patient with a wound on his plantar heel. This has no undermining today. We have been using Iodoflex to address this and it looks like it has been successful. He continues to have drainage however in the periwound is macerated Erik [name], Erik [name] ([id number]) [id number] Page 5 of 7 Objective Constitutional Sitting or standing Blood Pressure is within target range for patient.. Pulse regular and within target range for patient.SABRA Respirations regular, non-labored and within target range.. Temperature is normal and within the target range for the patient.SABRA Appears in no distress. Vitals Time Taken: 1:44 PM, Height: 71 in, Weight: 193 lbs, BMI: 26.9, Temperature: 97.8 F, Pulse: 81 bpm,  Respiratory Rate: 18 breaths/min, Blood Pressure: 132/74 mmHg, Capillary Blood Glucose: 81 mg/dl. General Notes: Wound exam; small punched-out area on the right plantar mid heel. Granulation tissue looks healthy. There is no undermining some periwound maceration. No evidence of infection Integumentary (Hair, Skin) Wound #1 status is Open. Original cause of wound was Gradually Appeared. The date acquired was: 01/21/2023. The wound has been in treatment 14 weeks. The wound is located on the Right Calcaneus. The wound measures 0.5cm length x 1cm width x 0.2cm depth; 0.393cm^2 area and 0.079cm^3 volume. There is Fat Layer (Subcutaneous Tissue) exposed. There is no tunneling or undermining noted. There is a medium amount of serosanguineous drainage noted. The wound margin is distinct with the outline attached to the wound base. There is large (67-100%) pink, pale granulation within the wound bed. There is no necrotic tissue within the wound bed. The periwound skin appearance had no abnormalities noted for color. The periwound skin appearance exhibited: Callus, Maceration. Periwound temperature was noted as No Abnormality. 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 There was a T Contact Cast otal Procedure by Rufus Ozell MATSU., MD. Post procedure Diagnosis Wound #1: Same as Pre-Procedure Plan Follow-up Appointments: Return Appointment in 1 week. - Dr Rosan 06/05/2023 230pm (already scheduled) Return Appointment in 2 weeks. - Dr. Rosan 06/12/2023 130pm (already scheduled) Return appointment in 3 weeks. - Dr. Rosan (front office to  schedule) Anesthetic: Wound #1 Right Calcaneus: (In clinic) Topical Lidocaine  4% applied to wound bed Bathing/ Shower/ Hygiene: May shower with protection but do not get wound dressing(s) wet. Protect dressing(s) with water repellant cover (for example, large plastic bag) or a cast cover and may then take shower. - purchase cast protector from [facility], [facility] or [employer] Off-Loading: T Contact Cast to Right Lower Extremity - size 3 otal WOUND #1: - Calcaneus Wound Laterality: Right Cleanser: Soap and Water 1 x Per Week/30 Days Discharge Instructions: May shower and wash wound with dial antibacterial soap and water prior to dressing change. Cleanser: Vashe 5.8 (oz) 1 x Per Week/30 Days Discharge Instructions: Cleanse the wound with Vashe prior to applying a clean dressing using gauze sponges, not tissue or cotton balls. Peri-Wound Care: Zinc Oxide Ointment 30g tube 1 x Per Week/30 Days Discharge Instructions: Apply Zinc Oxide to periwound with each dressing change Prim Dressing: Maxorb Extra Ag+ Alginate Dressing, 2x2 (in/in) 1 x Per Week/30 Days ary Discharge Instructions: Apply to wound bed as instructed Secondary Dressing: ABD Pad, 8x10 1 x Per Week/30 Days Discharge Instructions: Apply over primary dressing as directed. Secondary Dressing: Zetuvit Plus 4x8 in 1 x Per Week/30 Days Discharge Instructions: Apply over primary dressing as directed. Secondary Dressing: T Contact Cast 1 x Per Week/30 Days otal Discharge Instructions: last layer applied by provider. JAMAAL, BERNASCONI ([id number]) [id number] Page 6 of 7 Secondary Dressing: kerlix and tape 1 x Per Week/30 Days 1. I changed to silver  alginate to help with the maceration around the wound we have been using zinc oxide as well 2. Iodoflex to help fill in the undermining areas. 3. Still in a total contact cast which was applied with the standard fashion Electronic Signature(s) Signed: 05/29/2023 4:03:36 PM  By: Rufus Ozell MD Entered By: Rufus Ozell on 05/29/2023 14:06:35 -------------------------------------------------------------------------------- Total Contact Cast Details Patient Name: Date of Service: TO JUDGE LOISE ANTONIA HEATH W. 05/29/2023 1:30 PM Medical Record Number: [id number] Patient Account Number: [id number] Date of Birth/Sex: Treating RN: December 26, 1940 (83 y.o. M) Primary Care Provider: Tisovec, Richard Other Clinician: Referring Provider: Treating Provider/Extender: Rufus Ozell Vernadine Charlie Devra in Treatment: 14 T Contact Cast Applied for Wound Assessment: otal Wound #1 Right Calcaneus Performed By: Physician Rufus Ozell MATSU., MD The following information was scribed by: Drury Nestle The information was scribed for: Rufus Ozell Post Procedure Diagnosis Same as Pre-procedure Electronic Signature(s) Signed: 05/29/2023 4:03:36 PM By: Rufus Ozell MD Entered By: Rufus Ozell on 05/29/2023 14:02:43 -------------------------------------------------------------------------------- SuperBill Details Patient Name: Date of Service: TO JUDGE LOISE ANTONIA HEATH W. 05/29/2023 Medical Record Number: [id number] Patient Account Number: [id number] Date of Birth/Sex: Treating RN: January 15, 1941 (83 y.o. 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 E11.621 Type 2 diabetes mellitus with foot ulcer I87.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 Facility Procedures : DADRIAN, BALLANTINE Code: [id number] MATAIO MELE ([id number] Description: [phone] - APPLY TOTAL  CONTACT LEG CAST ICD-10 Diagnosis Description E11.621 Type 2 diabetes mellitus with foot ulcer L97.418 Non-pressure chronic  ulcer of right heel and midfoot with other specified sever 5) [phone] Modifier: [email or web address] [id number] Quantity: 1 227.pdf Page 7 of 7 Physician Procedures : CPT4 Code Description Modifier [id number] [phone] - WC PHYS APPLY TOTAL CONTACT CAST ICD-10 Diagnosis Description E11.621 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

## 2023-05-30 NOTE — Telephone Encounter (Signed)
 He needs to see dr.patel cause the pain has come back in his jaw and he's in extreme pain. His wife wants to talk with Erik [name] about this.

## 2023-05-30 NOTE — Progress Notes (Signed)
 Erik [name], Erik [name] ([id number]) [id number] Page 1 of 7 Visit Report for 05/29/2023 Arrival Information Details Patient Name: Date of Service: Erik [name] [facility] [name]. 05/29/2023 1:30 PM Medical Record Number: [id number] Patient Account Number: [id number] Date of Birth/Sex: Treating RN: 12/15/1940 (83 y.o. M) Primary Care Erik [name], Erik Other Clinician: Referring Erik [name]: Treating Erik [name]/Extender: Erik [name]: 14 Visit Information History Since Last Visit Added [name] deleted any medications: No Patient Arrived: Erik [name] Any new allergies [name] adverse reactions: No Arrival Time: 13:31 Had a fall [name] experienced change in No Accompanied By: self activities of daily living that may affect Transfer Assistance: None risk of falls: Patient Identification Verified: Yes Signs [name] symptoms of abuse/neglect since last No Secondary Verification Process Completed: Yes visito Patient Requires Transmission-Based Precautions: No Hospitalized since last visit: No Patient Has Alerts: Yes Implantable device outside of the clinic No Patient Alerts: ABI: R 1.08 11/14/22 excluding cellular tissue based products placed in the center since last visit: Has Dressing in Place as Prescribed: Yes Has Footwear/Offloading in Place as Yes Prescribed: Right: Removable Cast Walker/Walking Boot T Contact Cast otal Pain Present Now: No Electronic Signature(s) Signed: 05/29/2023 3:57:02 PM By: Erik [name] Entered By: Erik [name] on 05/29/2023 13:32:06 -------------------------------------------------------------------------------- Encounter Discharge Information Details Patient Name: Date of Service: Erik [name], [name] Erik [name]. 05/29/2023 1:30 PM Medical Record Number: [id number] Patient Account Number: [id number] Date of Birth/Sex: Treating RN: 1941-01-23 (83 y.o. Erik [name] Primary Care Jessey [name]: [name], Erik Other Clinician: Referring  Janesia Joswick: Treating Tristan Proto/Extender: Erik [name]: 14 Encounter Discharge Information Items Discharge Condition: Stable Ambulatory Status: Cane Discharge Destination: Home Transportation: Private Auto Accompanied By: self Schedule Follow-up Appointment: Yes Clinical Summary of Care: Electronic Signature(s) Signed: 05/30/2023 12:56:04 PM By: Erik [name] Entered By: Erik [name] on 05/29/2023 13:59:54 Erik [name] ([id number]) [id number] Page 2 of 7 -------------------------------------------------------------------------------- Lower Extremity Assessment Details Patient Name: Date of Service: Erik [name] Erik [name]. 05/29/2023 1:30 PM Medical Record Number: [id number] Patient Account Number: [id number] Date of Birth/Sex: Treating RN: Mar 10, 1941 (83 y.o. M) Primary Care Saraya Tirey: Erik [name] Other Clinician: Referring Erik [name]: Treating Tarica Harl/Extender: Erik [name]: 14 Edema Assessment Assessed: Colletta: No] Glenis: No] Edema: [Left: N] [Right: o] Calf Left: Right: Point of Measurement: From Medial Instep 39.4 cm Ankle Left: Right: Point of Measurement: From Medial Instep 26.7 cm Vascular Assessment Extremity colors, hair growth, and conditions: Extremity Color: [Right:Normal] Hair Growth on Extremity: [Right:No] Temperature of Extremity: [Right:Warm] Capillary Refill: [Right:< 3 seconds] Dependent Rubor: [Right:No Yes] Toe Nail Assessment Left: Right: Thick: No Discolored: No Deformed: No Improper Length and Hygiene: No Electronic Signature(s) Signed: 05/29/2023 3:57:02 PM By: Erik [name] Entered By: Erik [name] on 05/29/2023 13:45:33 -------------------------------------------------------------------------------- Multi Wound Chart Details Patient Name: Date of Service: Erik [name], [name] Erik [name]. 05/29/2023 1:30 PM Medical Record Number:  [id number] Patient Account Number: [id number] Date of Birth/Sex: Treating RN: 12/03/1940 (83 y.o. M) Primary Care Erik [name]: [name], Erik Other Clinician: Referring [name]: Treating Lelynd Poer/Extender: Erik [name]: 14 Vital Signs Height(in): 71 Capillary Blood Glucose(mg/dl): 81 Weight(lbs): 806 Pulse(bpm): 81 Body Mass Index(BMI): 26.9 Blood Pressure(mmHg): 132/74 Temperature(F): 97.8 Erik [name], Erik [name] ([id number]) [id number] Page 3 of 7 Respiratory Rate(breaths/min): 18 [1:Photos:] [N/A:N/A] Right Calcaneus N/A N/A Wound Location: Gradually Appeared N/A N/A Wounding Event: Diabetic Wound/Ulcer of the Lower N/A N/A Primary Etiology: Extremity Anemia, Arrhythmia, Coronary Artery N/A N/A Comorbid History: Disease, Hypertension,  Peripheral Arterial Disease, Type II Diabetes, Neuropathy 01/21/2023 N/A N/A Date Acquired: 14 N/A N/A Weeks of [name]: Open N/A N/A Wound Status: No N/A N/A Wound Recurrence: 0.5x1x0.2 N/A N/A Measurements L x [name] x D (cm) 0.393 N/A N/A A (cm) : rea 0.079 N/A N/A Volume (cm) : -178.70% N/A N/A % Reduction in A rea: -182.10% N/A N/A % Reduction in Volume: Grade 2 N/A N/A Classification: Medium N/A N/A Exudate A mount: Serosanguineous N/A N/A Exudate Type: red, brown N/A N/A Exudate Color: Distinct, outline attached N/A N/A Wound Margin: Large (67-100%) N/A N/A Granulation A mount: Pink, Pale N/A N/A Granulation Quality: None Present (0%) N/A N/A Necrotic A mount: Fat Layer (Subcutaneous Tissue): Yes N/A N/A Exposed Structures: Fascia: No Tendon: No Muscle: No Joint: No Bone: No Medium (34-66%) N/A N/A Epithelialization: Callus: Yes N/A N/A Periwound Skin Texture: Maceration: Yes N/A N/A Periwound Skin Moisture: No Abnormalities Noted N/A N/A Periwound Skin Color: No Abnormality N/A N/A Temperature: T Contact Cast otal N/A N/A Procedures  Performed: [name] Notes Wound #1 (Calcaneus) Wound Laterality: Right Cleanser Soap and Water Discharge Instruction: May shower and wash wound with dial antibacterial soap and water prior Erik dressing change. Vashe 5.8 (oz) Discharge Instruction: Cleanse the wound with Vashe prior Erik applying a clean dressing using gauze sponges, not tissue [name] cotton balls. Peri-Wound Care Zinc Oxide Ointment 30g tube Discharge Instruction: Apply Zinc Oxide Erik periwound with each dressing change Topical Primary Dressing Maxorb Extra Ag+ Alginate Dressing, 2x2 (in/in) Discharge Instruction: Apply Erik wound bed as instructed Secondary Dressing ABD Pad, 8x10 Discharge Instruction: Apply over primary dressing as directed. Zetuvit Plus 4x8 in Discharge Instruction: Apply over primary dressing as directed. T Contact Cast otal Discharge Instruction: last layer applied by Klare Criss. Erik [name], Erik [name] ([id number]) [id number] Page 4 of 7 kerlix and tape Secured With Compression Wrap Compression Stockings Add-Ons Electronic Signature(s) Signed: 05/29/2023 4:03:36 PM By: Erik Sharper MD Entered By: Erik Sharper on 05/29/2023 14:02:27 -------------------------------------------------------------------------------- Multi-Disciplinary Care Plan Details Patient Name: Date of Service: Erik [name], Erik [name]. 05/29/2023 1:30 PM Medical Record Number: [id number] Patient Account Number: [id number] Date of Birth/Sex: Treating RN: [birth year] (83 y.o. Erik [name] Primary Care Sunshine Mackowski: [name], Erik Other Clinician: Referring Dvontae Ruan: Treating Dewan Emond/Extender: Erik Sharper Erik [name] [name] in [name]: 14 Multidisciplinary Care Plan reviewed with physician Active Inactive Wound/Skin Impairment Nursing Diagnoses: Impaired tissue integrity Knowledge deficit related Erik ulceration/compromised skin integrity Goals: Patient/caregiver will verbalize understanding of skin  care regimen Date Initiated: 02/18/2023 Target Resolution Date: 07/25/2023 Goal Status: Active Ulcer/skin breakdown will have a volume reduction of 30% by week 4 Date Initiated: 02/18/2023 Date Inactivated: 05/22/2023 Target Resolution Date: 07/25/2023 Unmet Reason: see wound Goal Status: Unmet measurement. Interventions: Assess patient/caregiver ability Erik obtain necessary supplies Assess patient/caregiver ability Erik perform ulcer/skin care regimen upon admission and as needed Assess ulceration(s) [name] visit Provide education on ulcer and skin care Notes: Electronic Signature(s) Signed: 05/30/2023 12:56:04 PM By: Erik [name] Entered By: Erik [name] on 05/29/2023 13:50:53 -------------------------------------------------------------------------------- Pain Assessment Details Patient Name: Date of Service: Erik [name], Erik [name]. 05/29/2023 1:30 PM Erik [name] ([id number]) [id number] Page 5 of 7 Medical Record Number: [id number] Patient Account Number: [id number] Date of Birth/Sex: Treating RN: [birth year] (83 y.o. M) Primary Care Shalay Carder: [name], Erik Other Clinician: Referring Zaidan Keeble: Treating Kadijah Shamoon/Extender: Erik Sharper Erik [name] [name] in [name]: 14 Active Problems Location of Pain Severity and Description of Pain Patient Has Paino No Site Locations Pain Management and Medication  Current Pain Management: Electronic Signature(s) Signed: 05/29/2023 3:57:02 PM By: Erik [name] Entered By: Erik [name] on 05/29/2023 13:45:06 -------------------------------------------------------------------------------- Patient/Caregiver Education Details Patient Name: Date of Service: Erik [name] 1/2/2025andnbsp1:30 PM Medical Record Number: [id number] Patient Account Number: [id number] Date of Birth/Gender: Treating RN: 11/18/1940 (82 y.o. Erik [name] Primary Care Physician: [name], Erik Other Clinician: Referring  Physician: Treating Physician/Extender: Erik [name]: 14 Education Assessment Education Provided Erik: Patient Education Topics Provided Wound/Skin Impairment: Handouts: Caring for Your Ulcer Methods: Explain/Verbal Responses: Reinforcements needed Electronic Signature(s) Signed: 05/30/2023 12:56:04 PM By: Erik [name] Entered By: Erik [name] on 05/29/2023 13:52:41 Erik [name] ([id number]) [id number] Page 6 of 7 -------------------------------------------------------------------------------- Wound Assessment Details Patient Name: Date of Service: Erik [name] Erik HEATH [name]. 05/29/2023 1:30 PM Medical Record Number: [id number] Patient Account Number: [id number] Date of Birth/Sex: Treating RN: Mar 31, 1941 (83 y.o. M) Primary Care Taner Rzepka: [name], Erik Other Clinician: Referring Ruba Outen: Treating Alaya Iverson/Extender: Erik [name]: 14 Wound Status Wound Number: 1 Primary Diabetic Wound/Ulcer of the Lower Extremity Etiology: Wound Location: Right Calcaneus Wound Open Wounding Event: Gradually Appeared Status: Date Acquired: 01/21/2023 Comorbid Anemia, Arrhythmia, Coronary Artery Disease, Hypertension, Weeks Of [name]: 14 History: Peripheral Arterial Disease, Type II Diabetes, Neuropathy Clustered Wound: No Photos Wound Measurements Length: (cm) 0. Width: (cm) 1 Depth: (cm) 0. Area: (cm) 0 Volume: (cm) 0 5 % Reduction in Area: -178.7% % Reduction in Volume: -182.1% 2 Epithelialization: Medium (34-66%) .393 Tunneling: No .079 Undermining: No Wound Description Classification: Grade 2 Wound Margin: Distinct, outline attached Exudate Amount: Medium Exudate Type: Serosanguineous Exudate Color: red, brown Foul Odor After Cleansing: No Slough/Fibrino Yes Wound Bed Granulation Amount: Large (67-100%) Exposed Structure Granulation Quality: Pink, Pale Fascia  Exposed: No Necrotic Amount: None Present (0%) Fat Layer (Subcutaneous Tissue) Exposed: Yes Tendon Exposed: No Muscle Exposed: No Joint Exposed: No Bone Exposed: No Periwound Skin Texture Texture Color No Abnormalities Noted: No No Abnormalities Noted: Yes Callus: Yes Temperature / Pain Temperature: No Abnormality Moisture No Abnormalities Noted: No Maceration: Yes [name] Notes Wound #1 (Calcaneus) Wound Laterality: Right Cleanser GERHARDT, GLEED ([id number]) [id number] Page 7 of 7 Soap and Water Discharge Instruction: May shower and wash wound with dial antibacterial soap and water prior Erik dressing change. Vashe 5.8 (oz) Discharge Instruction: Cleanse the wound with Vashe prior Erik applying a clean dressing using gauze sponges, not tissue [name] cotton balls. Peri-Wound Care Zinc Oxide Ointment 30g tube Discharge Instruction: Apply Zinc Oxide Erik periwound with each dressing change Topical Primary Dressing Maxorb Extra Ag+ Alginate Dressing, 2x2 (in/in) Discharge Instruction: Apply Erik wound bed as instructed Secondary Dressing ABD Pad, 8x10 Discharge Instruction: Apply over primary dressing as directed. Zetuvit Plus 4x8 in Discharge Instruction: Apply over primary dressing as directed. T Contact Cast otal Discharge Instruction: last layer applied by Tyechia Allmendinger. kerlix and tape Secured With Compression Wrap Compression Stockings Facilities Manager) Signed: 05/29/2023 4:27:30 PM By: Cammie Sailors RN, [name] Entered By: Cammie Sailors on 05/29/2023 13:52:24 -------------------------------------------------------------------------------- Vitals Details Patient Name: Date of Service: Erik [name], Erik [name]. 05/29/2023 1:30 PM Medical Record Number: [id number] Patient Account Number: [id number] Date of Birth/Sex: Treating RN: Dec 19, 1940 (83 y.o. M) Primary Care Fenna Semel: [name], Erik Other Clinician: Referring Deng Kemler: Treating  Sherae Santino/Extender: Erik [name]: 14 Vital Signs Time Taken: 13:44 Temperature (F): 97.8 Height (in): 71 Pulse (bpm): 81 Weight (lbs): 193 Respiratory Rate (breaths/min): 18 Body Mass Index (BMI):  26.9 Blood Pressure (mmHg): 132/74 Capillary Blood Glucose (mg/dl): 81 Reference Range: 80 - 120 mg / dl Electronic Signature(s) Signed: 05/29/2023 3:57:02 PM By: Erik [name] Entered By: Erik [name] on 05/29/2023 13:44:59

## 2023-06-02 NOTE — Telephone Encounter (Signed)
 Called patient and informed him of Dr. Anthony recommendation  Recommend increasing oxcarbamazepine to 1 full tablet twice daily (currently taking half tablet).  We can also add him to my wait list for a sooner visit.   Patient verbalized understanding and had no further questions or concerns.

## 2023-06-02 NOTE — Telephone Encounter (Signed)
 Recommend increasing oxcarbamazepine to 1 full tablet twice daily (currently taking half tablet).  We can also add him to my wait list for a sooner visit.

## 2023-06-05 ENCOUNTER — Encounter (HOSPITAL_BASED_OUTPATIENT_CLINIC_OR_DEPARTMENT_OTHER): Payer: PPO | Admitting: Internal Medicine

## 2023-06-05 DIAGNOSIS — L97418 Non-pressure chronic ulcer of right heel and midfoot with other specified severity: Secondary | ICD-10-CM

## 2023-06-05 DIAGNOSIS — E11621 Type 2 diabetes mellitus with foot ulcer: Secondary | ICD-10-CM | POA: Diagnosis not present

## 2023-06-05 DIAGNOSIS — I87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: Secondary | ICD-10-CM

## 2023-06-10 ENCOUNTER — Ambulatory Visit: Payer: PPO

## 2023-06-11 NOTE — Progress Notes (Addendum)
 Erik [name], Erik [name] ([id number]) [id number] Page 1 of 7 Visit Report for 06/05/2023 Arrival Information Details Patient Name: Date of Service: [name] 06/05/2023 2:30 PM Medical Record Number: [id number] Patient Account Number: [id number] Date of Birth/Sex: Treating RN: 03/06/1941 (83 y.o. M) Primary Care Erik [name]: [name], Erik Other Clinician: Referring Erik [name]: Treating Erik [name]/Extender: Erik [name] in Treatment: 15 Visit Information History Since Last Visit Added or deleted any medications: No Patient Arrived: Cane Any new allergies or adverse reactions: No Arrival Time: 14:53 Had a fall or experienced change in No Accompanied By: self activities of daily living that may affect Transfer Assistance: None risk of falls: Patient Identification Verified: Yes Signs or symptoms of abuse/neglect since last No Secondary Verification Process Completed: Yes visito Patient Requires Transmission-Based Precautions: No Hospitalized since last visit: No Patient Has Alerts: Yes Implantable device outside of the clinic No Patient Alerts: ABI: R 1.08 11/14/22 excluding cellular tissue based products placed in the center since last visit: Has Dressing in Place as Prescribed: Yes Has Footwear/Offloading in Place as Yes Prescribed: Right: Removable Cast Walker/Walking Boot T Contact Cast otal Pain Present Now: No Electronic Signature(s) Signed: 06/09/2023 5:23:50 PM By: Erik [name] Entered By: Erik [name] on 06/05/2023 14:56:05 -------------------------------------------------------------------------------- Encounter Discharge Information Details Patient Name: Date of Service: TO JUDGE Erik, Erik [name] W. 06/05/2023 2:30 PM Medical Record Number: [id number] Patient Account Number: [id number] Date of Birth/Sex: Treating RN: [birth year] (83 y.o. Erik [name] Primary Care Erik [name]: [name], Erik Other Clinician: Referring  Erik [name]: Treating Erik [name]/Extender: Erik [name] in Treatment: 15 Encounter Discharge Information Items Discharge Condition: Stable Ambulatory Status: Ambulatory Discharge Destination: Home Transportation: Private Auto Accompanied By: spouse Schedule Follow-up Appointment: Yes Clinical Summary of Care: Patient Declined Electronic Signature(s) Signed: 06/05/2023 6:12:20 PM By: Erik Pollen RN Entered By: Erik [name] on 06/05/2023 17:43:02 Erik [name] ([id number]) [id number] Page 2 of 7 -------------------------------------------------------------------------------- Lower Extremity Assessment Details Patient Name: Date of Service: [name] 06/05/2023 2:30 PM Medical Record Number: [id number] Patient Account Number: [id number] Date of Birth/Sex: Treating RN: Feb 27, 1941 (83 y.o. M) Primary Care Erik [name]: [name], Erik Other Clinician: Referring Erik [name]: Treating Erik [name]/Extender: Erik [name] in Treatment: 15 Edema Assessment Assessed: [Left: No] [Right: No] Edema: [Left: N] [Right: o] Calf Left: Right: Point of Measurement: From Medial Instep 37.6 cm Ankle Left: Right: Point of Measurement: From Medial Instep 26.5 cm Vascular Assessment Extremity colors, hair growth, and conditions: Extremity Color: [Right:Normal] Hair Growth on Extremity: [Right:No] Temperature of Extremity: [Right:Warm] Capillary Refill: [Right:< 3 seconds] Dependent Rubor: [Right:No Yes] Electronic Signature(s) Signed: 06/09/2023 5:23:50 PM By: Erik [name] Entered By: Erik [name] on 06/05/2023 15:16:13 -------------------------------------------------------------------------------- Multi Wound Chart Details Patient Name: Date of Service: TO JUDGE Erik [name] W. 06/05/2023 2:30 PM Medical Record Number: [id number] Patient Account Number: [id number] Date of Birth/Sex: Treating RN: [birth year] (83 y.o.  M) Primary Care Erik [name]: [name], Erik Other Clinician: Referring Erik [name]: Treating Erik [name]/Extender: Erik [name] in Treatment: 15 Vital Signs Height(in): 71 Capillary Blood Glucose(mg/dl): 82 Weight(lbs): 806 Pulse(bpm): 69 Body Mass Index(BMI): 26.9 Blood Pressure(mmHg): 145/64 Temperature(F): 98 Respiratory Rate(breaths/min): 18 [1:Photos:] [N/A:N/A] Right Calcaneus N/A N/A Wound Location: Gradually Appeared N/A N/A Wounding Event: Diabetic Wound/Ulcer of the Lower N/A N/A Primary Etiology: Extremity Anemia, Arrhythmia, Coronary Artery N/A N/A Comorbid History: Disease, Hypertension, Peripheral Arterial Disease, Type II Diabetes, Neuropathy 01/21/2023 N/A N/A Date Acquired: 15 N/A N/A Weeks of Treatment: Open N/A N/A Wound Status: No  N/A N/A Wound Recurrence: 0.5x0.6x0.3 N/A N/A Measurements L x W x D (cm) 0.236 N/A N/A A (cm) : rea 0.071 N/A N/A Volume (cm) : -67.40% N/A N/A % Reduction in A rea: -153.60% N/A N/A % Reduction in Volume: 9 Starting Position 1 (o'clock): 1 Ending Position 1 (o'clock): 0.2 Maximum Distance 1 (cm): Yes N/A N/A Undermining: Grade 2 N/A N/A Classification: Medium N/A N/A Exudate A mount: Serosanguineous N/A N/A Exudate Type: red, brown N/A N/A Exudate Color: Distinct, outline attached N/A N/A Wound Margin: Large (67-100%) N/A N/A Granulation A mount: Pink, Pale N/A N/A Granulation Quality: Small (1-33%) N/A N/A Necrotic A mount: Fat Layer (Subcutaneous Tissue): Yes N/A N/A Exposed Structures: Fascia: No Tendon: No Muscle: No Joint: No Bone: No Medium (34-66%) N/A N/A Epithelialization: Callus: Yes N/A N/A Periwound Skin Texture: Maceration: Yes N/A N/A Periwound Skin Moisture: No Abnormalities Noted N/A N/A Periwound Skin Color: No Abnormality N/A N/A Temperature: T Contact Cast otal N/A N/A Procedures Performed: Treatment Notes Wound #1 (Calcaneus) Wound  Laterality: Right Cleanser Soap and Water Discharge Instruction: May shower and wash wound with dial antibacterial soap and water prior to dressing change. Vashe 5.8 (oz) Discharge Instruction: Cleanse the wound with Vashe prior to applying a clean dressing using gauze sponges, not tissue or cotton balls. Peri-Wound Care Zinc Oxide Ointment 30g tube Discharge Instruction: Apply Zinc Oxide to periwound with each dressing change Topical Gentamicin Discharge Instruction: In office- As directed by physician Mupirocin  Ointment Discharge Instruction: In Office-Apply Mupirocin  (Bactroban ) as instructed Primary Dressing Maxorb Extra Ag+ Alginate Dressing, 2x2 (in/in) Discharge Instruction: Apply to wound bed as instructed Secondary Dressing ABD Pad, 8x10 Discharge Instruction: Apply over primary dressing as directed. Zetuvit Plus 4x8 in Discharge Instruction: Apply over primary dressing as directed. [id number] RITIK, STAVOLA ([id number]) [id number] Page 4 of 7 Discharge Instruction: last layer applied by Zachory Mangual. kerlix and tape Secured With Compression Wrap Compression Stockings Add-Ons Electronic Signature(s) Signed: 06/10/2023 3:06:42 PM By: Erik Raisin DO Entered By: Erik Raisin on 06/10/2023 13:15:49 -------------------------------------------------------------------------------- Multi-Disciplinary Care Plan Details Patient Name: Date of Service: TO JUDGE Erik [name] W. 06/05/2023 2:30 PM Medical Record Number: [id number] Patient Account Number: [id number] Date of Birth/Sex: Treating RN: November 05, 1940 (83 y.o. Erik [name] Primary Care Yvan Dority: [name], Erik Other Clinician: Referring Prynce Jacober: Treating Chene Kasinger/Extender: Erik Raisin Vernadine [name] [name] in Treatment: 15 Multidisciplinary Care Plan reviewed with physician Active Inactive Wound/Skin Impairment Nursing Diagnoses: Impaired tissue integrity Knowledge  deficit related to ulceration/compromised skin integrity Goals: Patient/caregiver will verbalize understanding of skin care regimen Date Initiated: 02/18/2023 Target Resolution Date: 08/23/2023 Goal Status: Active Ulcer/skin breakdown will have a volume reduction of 30% by week 4 Date Initiated: 02/18/2023 Date Inactivated: 05/22/2023 Target Resolution Date: 07/25/2023 Unmet Reason: see wound Goal Status: Unmet measurement. Interventions: Assess patient/caregiver ability to obtain necessary supplies Assess patient/caregiver ability to perform ulcer/skin care regimen upon admission and as needed Assess ulceration(s) every visit Provide education on ulcer and skin care Notes: Electronic Signature(s) Signed: 06/05/2023 6:12:20 PM By: Erik Pollen RN Entered By: Erik [name] on 06/05/2023 17:39:51 Pain Assessment Details -------------------------------------------------------------------------------- Erik [name] ([id number]) [id number] Page 5 of 7 Patient Name: Date of Service: [name] 06/05/2023 2:30 PM Medical Record Number: [id number] Patient Account Number: [id number] Date of Birth/Sex: Treating RN: [birth year] (83 y.o. M) Primary Care Juriel Cid: [name], Erik Other Clinician: Referring Alayha Babineaux: Treating Starnisha Batrez/Extender: Erik Raisin Vernadine [name] [name] in Treatment: 15 Active Problems Location of Pain Severity and Description  of Pain Patient Has Paino No Site Locations Pain Management and Medication Current Pain Management: Electronic Signature(s) Signed: 06/09/2023 5:23:50 PM By: Erik [name] Entered By: Erik [name] on 06/05/2023 15:15:59 -------------------------------------------------------------------------------- Patient/Caregiver Education Details Patient Name: Date of Service: TO JUDGE Erik [name] Erik [name] 1/9/2025andnbsp2:30 PM Medical Record Number: [id number] Patient Account Number: [id number] Date of  Birth/Gender: Treating RN: Oct 10, 1940 (83 y.o. Erik [name] Primary Care Physician: [name], Erik Other Clinician: Referring Physician: Treating Physician/Extender: Erik [name] in Treatment: 15 Education Assessment Education Provided To: Patient Education Topics Provided Wound/Skin Impairment: Methods: Explain/Verbal Responses: State content correctly [employer]) Signed: 06/05/2023 6:12:20 PM By: Erik Pollen RN Entered By: Erik [name] on 06/05/2023 17:41:58 Erik [name] ([id number]) [id number] Page 6 of 7 -------------------------------------------------------------------------------- Wound Assessment Details Patient Name: Date of Service: [name] 06/05/2023 2:30 PM Medical Record Number: [id number] Patient Account Number: [id number] Date of Birth/Sex: Treating RN: 06/27/1940 (83 y.o. M) Primary Care Emmitt Matthews: Vernadine [name] Other Clinician: Referring Jaliel Deavers: Treating Zyionna Pesce/Extender: Erik [name] in Treatment: 15 Wound Status Wound Number: 1 Primary Diabetic Wound/Ulcer of the Lower Extremity Etiology: Wound Location: Right Calcaneus Wound Open Wounding Event: Gradually Appeared Status: Date Acquired: 01/21/2023 Comorbid Anemia, Arrhythmia, Coronary Artery Disease, Hypertension, Weeks Of Treatment: 15 History: Peripheral Arterial Disease, Type II Diabetes, Neuropathy Clustered Wound: No Photos Wound Measurements Length: (cm) 0.5 Width: (cm) 0.6 Depth: (cm) 0.3 Area: (cm) 0.236 Volume: (cm) 0.071 % Reduction in Area: -67.4% % Reduction in Volume: -153.6% Epithelialization: Medium (34-66%) Tunneling: No Undermining: Yes Starting Position (o'clock): 9 Ending Position (o'clock): 1 Maximum Distance: (cm) 0.2 Wound Description Classification: Grade 2 Wound Margin: Distinct, outline attached Exudate Amount: Medium Exudate Type:  Serosanguineous Exudate Color: red, brown Foul Odor After Cleansing: No Slough/Fibrino Yes Wound Bed Granulation Amount: Large (67-100%) Exposed Structure Granulation Quality: Pink, Pale Fascia Exposed: No Necrotic Amount: Small (1-33%) Fat Layer (Subcutaneous Tissue) Exposed: Yes Necrotic Quality: Adherent Slough Tendon Exposed: No Muscle Exposed: No Joint Exposed: No Bone Exposed: No Periwound Skin Texture Texture Color No Abnormalities Noted: No No Abnormalities Noted: Yes Callus: Yes Temperature / Pain Temperature: No Abnormality Moisture No Abnormalities Noted: No Maceration: Yes Treatment Notes Erik [name], Erik [name] ([id number]) [id number] Page 7 of 7 Wound #1 (Calcaneus) Wound Laterality: Right Cleanser Soap and Water Discharge Instruction: May shower and wash wound with dial antibacterial soap and water prior to dressing change. Vashe 5.8 (oz) Discharge Instruction: Cleanse the wound with Vashe prior to applying a clean dressing using gauze sponges, not tissue or cotton balls. Peri-Wound Care Zinc Oxide Ointment 30g tube Discharge Instruction: Apply Zinc Oxide to periwound with each dressing change Topical Gentamicin Discharge Instruction: In office- As directed by physician Mupirocin  Ointment Discharge Instruction: In Office-Apply Mupirocin  (Bactroban ) as instructed Primary Dressing Maxorb Extra Ag+ Alginate Dressing, 2x2 (in/in) Discharge Instruction: Apply to wound bed as instructed Secondary Dressing ABD Pad, 8x10 Discharge Instruction: Apply over primary dressing as directed. Zetuvit Plus 4x8 in Discharge Instruction: Apply over primary dressing as directed. T Contact Cast otal Discharge Instruction: last layer applied by Cynthea Zachman. kerlix and tape Secured With Compression Wrap Compression Stockings Add-Ons Electronic Signature(s) Signed: 06/09/2023 5:23:50 PM By: Erik [name] Entered By: Erik [name] on 06/05/2023  15:17:31 -------------------------------------------------------------------------------- Vitals Details Patient Name: Date of Service: TO JUDGE Erik [name] W. 06/05/2023 2:30 PM Medical Record Number: [id number] Patient Account Number: [id number] Date of Birth/Sex: Treating RN: Jul 11, 1940 (83 y.o. M) Primary Care Undrea Shipes: [name], Erik Other Clinician: Referring Royetta Probus:  Treating Joslynne Klatt/Extender: Erik Harlene Reas, Erik Weeks in Treatment: 15 Vital Signs Time Taken: 15:15 Temperature (F): 98 Height (in): 71 Pulse (bpm): 69 Weight (lbs): 193 Respiratory Rate (breaths/min): 18 Body Mass Index (BMI): 26.9 Blood Pressure (mmHg): 145/64 Capillary Blood Glucose (mg/dl): 82 Reference Range: 80 - 120 mg / dl Electronic Signature(s) Signed: 06/09/2023 5:23:50 PM By: Erik [name] Entered By: Erik [name] on 06/05/2023 15:15:53

## 2023-06-11 NOTE — Progress Notes (Signed)
 Erik [name], Erik [name] ([id number]) [id number] Page 1 of 7 Visit Report for 06/05/2023 Chief Complaint Document Details Patient Name: Date of Service: Erik [name] Erik [name]. 06/05/2023 2:30 PM Medical Record Number: [id number] Patient Account Number: [id number] Date of Birth/Sex: Treating RN: [birth year] (83 y.o. M) Primary Care Provider: Tisovec, Erik Other Clinician: Referring Provider: Treating Provider/Extender: Erik [name] Erik [name] Erik [name] in Treatment: 15 Information Obtained from: Patient Chief Complaint 02/18/2023; patient is here for review of a wound in the right mid plantar foot Electronic Signature(s) Signed: 06/10/2023 3:06:42 PM By: Erik Harlene DO Entered By: Erik [name] on 06/10/2023 13:15:57 -------------------------------------------------------------------------------- HPI Details Patient Name: Date of Service: Erik [name] Erik ANTONIA HEATH W. 06/05/2023 2:30 PM Medical Record Number: [id number] Patient Account Number: [id number] Date of Birth/Sex: Treating RN: Erik [name], Erik (83 y.o. M) Primary Care Provider: Tisovec, Erik Other Clinician: Referring Provider: Treating Provider/Extender: Erik [name] Erik [name] Erik [name] in Treatment: 15 History of Present Illness HPI Description: ADMISSION 02/18/2023 This is an 83 year old still very active man who was referred Erik us  by [name] at [name]. He has a nonhealing wound on the plantar aspect of his right heel. He has been dealing with this since June when he presented Erik Erik [name] with a painful area on the right mid heel. He was not really sure how this happened. He ultimately had an MRI in July that showed no osteomyelitis but there was a small abscess he was treated with antibiotics doxycycline  and gradually the pain improved but he still been left with a small open area. He had an ABI on 11/14/2022 that was 1.08 on the right and 0.99 on the left. He by enlarge has been using  Silvadene  on the wound. Recently changed Erik silver  alginate. He also developed a blister on the right anterior lower leg. Recent x-rays have not shown any cortical changes. Also in June he had a sedimentation rate of 2 and a C-reactive protein of 14 not suggestive of chronic active infection Past medical history includes type 2 diabetes, nonischemic cardiomyopathy, hypertension cholesterolemia, skin cancer, combined congestive heart failure, paroxysmal A-fib, idiopathic trigeminal neuralgia. 10/1; ulcer on the plantar aspect of his right heel. He is being compliant with the healing boot. We have been using Hydrofera Blue heel cup gauze. Dimensions are better 10/7; the area on the leg right anterior is healed. He has chronic insufficiency. Unfortunately the ulcer on the plantar aspect of his right heel looks about the same each time with undermining the skin is not adherent Erik the underlying tissue. This requires debridement. We have been using Hydrofera Blue, heel cup and heel offloading shoe. We are not making progress with the heel ulcer. 10/15; I brought the patient in today with the intention of putting him in a total contact cast if the wound on his plantar heel on the right was no better. However he comes in with erythema spreading towards the tip of his heel. There is also bogginess. There is no crepitus. He is a very active man. He is in the heel off loader but I do not think for 1 reason or another this is doing the job in terms of pressure and friction relief. I am concerned enough about the wound today not Erik put on a total contact cast 10/22; culture I did last week showed Staphylococcus epidermidis which is likely a skin contaminant but he also grew Staphylococcus capitis which was resistant Erik the doxycycline  I put him on. I am going Erik give him  a week of trimethoprim/sulfamethoxazole for this. I am not sure of the relevance of the coag negative staph however I do not want Erik put a cast  and something that is infected. We have been using Iodoflex 10/29; patient with a right plantar heel wound. This was initially probably infectious with an MRI showing a small abscess but no osteomyelitis. 2 weeks ago he had erythema around the wound and bogginess but no crepitus I treated him with a prolonged course of antibiotics last week giving and will further week of Erik [name], Erik [name] ([id number]) [phone].pdf Page 2 of 7 Septra. We have been using mupirocin  and silver  alginate. The wound is measuring smaller today but there is undermining at 0.9 cm almost circumferentially 11/7; right plantar heel. This was not any smaller this week with considerable relative undermining almost circumferentially. This required debridement. I wanted Erik put this in a total contact cast but the patient was in a sling for a gene that we would not have been able Erik get off over the cast. 11/18; small wound with significant relative depth. We have been using endoform and we will put a total contact cast on him for the first time today. 11/20; in for the first obligatory total contact cast we have been using endoform small punched-out area on the right plantar heel seems stable Erik improved 04/22/2023: There is a fair amount of undermining created by some skin and callus. There is some slough on the surface. He does have a small abrasion on his anterior tibial surface and dorsal foot from his cast. [name]/3; wound on the right plantar heel. Again he comes in with undermining this is not a new phenomenon. We've been using endoform and a TCC. [name]/10; right plantar heel. Again significant undermining maceration of the skin. All of this had Erik be removed. Fortunately the wound does not look infected however clearly not improving [name]/19; right plantar heel. Not much improvement in the condition of the wound but it is not worse. He has an undermining area from about 6-9 o'clock about half a centimeter. More  worrisome this the granulation tissue is not adherent. There is no palpable bone. However there is still considerable relative drainage. [name]/26/2024: Last week, Iodoflex was placed in the wound Erik try and address the undermined area. This seems Erik have been effective, as I do not identify any undermined portion of the wound. There is slough on the wound surface. He still has a fair amount of drainage that has resulted in some mild periwound tissue maceration. 05/29/2023; patient with a wound on his plantar heel. This has no undermining today. We have been using Iodoflex Erik address this and it looks like it has been successful. He continues Erik have drainage however in the periwound is macerated 06/05/2023; patient presents for follow-up. We have been using silver  alginate under the total contact cast. Patient has no issues or complaints today. He denies signs of infection. Electronic Signature(s) Signed: 06/10/2023 3:06:42 PM By: Erik Raisin DO Entered By: Erik Raisin on 06/10/2023 13:16:22 -------------------------------------------------------------------------------- Physical Exam Details Patient Name: Date of Service: Erik [name] Erik ANTONIA HEATH W. 06/05/2023 2:30 PM Medical Record Number: [id number] Patient Account Number: [id number] Date of Birth/Sex: Treating RN: [birth year]rik (83 y.o. M) Primary Care Provider: Tisovec, Erik Other Clinician: Referring Provider: Treating Provider/Extender: Erik Raisin Erik [name] Erik [name] in Treatment: 15 Constitutional respirations regular, non-labored and within target range for patient.. Cardiovascular 2+ dorsalis pedis/posterior tibialis pulses. Psychiatric pleasant and cooperative. Notes small punched-out area on the  right plantar mid heel with Granulation tissue. No signs of surrounding infection. Electronic Signature(s) Signed: 06/10/2023 3:06:42 PM By: Erik Raisin DO Entered By: Erik Raisin on 06/10/2023  13:17:37 -------------------------------------------------------------------------------- Physician Orders Details Patient Name: Date of Service: Erik [name] Erik ANTONIA HEATH W. 06/05/2023 2:30 PM Medical Record Number: [id number] Patient Account Number: [id number] Erik [name], Erik [name] ([id number]) [phone].pdf Page 3 of 7 Date of Birth/Sex: Treating RN: [birth year]rik (83 y.o. Erik [name] Primary Care Provider: Tisovec, Erik Other Clinician: Referring Provider: Treating Provider/Extender: Erik Raisin Erik [name] Erik [name] in Treatment: 15 Verbal / Phone Orders: No Diagnosis Coding Follow-up Appointments ppointment in 1 week. - Dr Erik 06/12/2023 1:30pm (already scheduled) Return A ppointment in 2 weeks. - Dr. Rosan (already scheduled) Return A Return appointment in 3 weeks. - Dr. Rosan (front office Erik schedule) Anesthetic Wound #1 Right Calcaneus (In clinic) Topical Lidocaine  4% applied Erik wound bed Bathing/ Shower/ Hygiene May shower with protection but do not get wound dressing(s) wet. Protect dressing(s) with water repellant cover (for example, large plastic bag) or a cast cover and may then take shower. - purchase cast protector from [facility], [facility] or [employer] Off-Loading Total Contact Cast Erik Right Lower Extremity - size 3 Wound Treatment Wound #1 - Calcaneus Wound Laterality: Right Cleanser: Soap and Water 1 x Per Week/30 Days Discharge Instructions: May shower and wash wound with dial antibacterial soap and water prior Erik dressing change. Cleanser: Vashe 5.8 (oz) 1 x Per Week/30 Days Discharge Instructions: Cleanse the wound with Vashe prior Erik applying a clean dressing using gauze sponges, not tissue or cotton balls. Peri-Wound Care: Zinc Oxide Ointment 30g tube 1 x Per Week/30 Days Discharge Instructions: Apply Zinc Oxide Erik periwound with each dressing change Topical: Gentamicin 1 x Per Week/30 Days Discharge Instructions: In office- As  directed by physician Topical: Mupirocin  Ointment 1 x Per Week/30 Days Discharge Instructions: In Office-Apply Mupirocin  (Bactroban ) as instructed Prim Dressing: Maxorb Extra Ag+ Alginate Dressing, 2x2 (in/in) 1 x Per Week/30 Days ary Discharge Instructions: Apply Erik wound bed as instructed Secondary Dressing: ABD Pad, 8x10 1 x Per Week/30 Days Discharge Instructions: Apply over primary dressing as directed. Secondary Dressing: Zetuvit Plus 4x8 in 1 x Per Week/30 Days Discharge Instructions: Apply over primary dressing as directed. Secondary Dressing: T Contact Cast 1 x Per Week/30 Days otal Discharge Instructions: last layer applied by provider. Secondary Dressing: kerlix and tape 1 x Per Week/30 Days Electronic Signature(s) Signed: 06/10/2023 3:06:42 PM By: Erik Raisin DO Previous Signature: 06/05/2023 6:[name]:20 PM Version By: Claven Pollen RN Entered By: Erik Raisin on 06/10/2023 13:17:56 -------------------------------------------------------------------------------- Problem List Details Patient Name: Date of Service: Erik [name] Erik ANTONIA HEATH W. 06/05/2023 2:30 PM Medical Record Number: [id number] Patient Account Number: [id number] Date of Birth/Sex: Treating RN: [name]/[name]/40 (83 y.o. 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 06/05/2023 2:30 PM Medical Record Number: [id number] Patient Account Number: [id number] Date of Birth/Sex: Treating RN: Erik[birth year] (83 y.o. M) Primary Care Provider: Tisovec, Erik Other Clinician: Referring Provider: Treating Provider/Extender: Erik Raisin Erik [name] Erik [name] in Treatment: 15 Subjective Chief Complaint Information obtained from Patient 02/18/2023; patient is here for review of a wound in the right mid plantar foot History of Present Illness (HPI) ADMISSION 02/18/2023 This is an 83 year old still very active man who was referred Erik us  by [name] at [name]. He has a nonhealing wound on the plantar aspect of his right heel. He has been dealing with this since June when he presented Erik Erik [name] with a painful area on the right mid heel. He was not really sure how this happened. He ultimately had an MRI in July that showed no osteomyelitis but there was a small abscess he was treated with antibiotics doxycycline  and gradually the pain improved but he still been left with a small open area. He had an ABI on 11/14/2022 that was 1.08 on the right and 0.99 on the left. He by enlarge has been using Silvadene  on the wound. Recently changed Erik silver  alginate. He also developed a blister on the right anterior lower leg. Recent x-rays have not shown any cortical changes. Also in June he had a sedimentation rate of 2 and a C-reactive protein of 14 not suggestive of chronic active infection Past medical history includes type 2 diabetes, nonischemic cardiomyopathy,  hypertension cholesterolemia, skin cancer, combined congestive heart failure, paroxysmal A-fib, idiopathic trigeminal neuralgia. 10/1; ulcer on the plantar aspect of his right heel. He is being compliant with the healing boot. We have been using Hydrofera Blue heel cup gauze. Dimensions are better Erik [name], Erik [name] ([id number]) [phone].pdf Page 5 of 7 10/7; the area on the leg right anterior is healed. He has chronic insufficiency. Unfortunately the ulcer on the plantar aspect of his right heel looks about the same each time with undermining the skin is not adherent Erik the underlying tissue. This requires debridement. We have been using Hydrofera Blue, heel cup and heel offloading shoe. We are not making progress with the heel ulcer. 10/15; I brought the patient in today with the intention of putting him in a total contact cast if the wound on his plantar heel on the right was no better. However he comes in with erythema spreading towards the tip of his heel. There is also bogginess. There is no crepitus. He is a very active man. He is in the heel off loader but I do not think for 1 reason or another this is doing the job in terms of pressure and friction relief. I am concerned enough about the wound today not Erik put on a total contact cast 10/22; culture I did last week showed Staphylococcus epidermidis which is likely a skin contaminant but he also grew Staphylococcus capitis which was resistant Erik the doxycycline  I put him on. I am going Erik give him a week of trimethoprim/sulfamethoxazole for this. I am not sure of the relevance of the coag negative staph however I do not want Erik put a cast and something that is infected. We have been using Iodoflex 10/29; patient with a right plantar heel wound. This was initially probably infectious with an MRI showing a small abscess but no osteomyelitis. 2 weeks ago he had erythema around the  wound and bogginess but no crepitus I  treated him with a prolonged course of antibiotics last week giving and will further week of Septra. We have been using mupirocin  and silver  alginate. The wound is measuring smaller today but there is undermining at 0.9 cm almost circumferentially 11/7; right plantar heel. This was not any smaller this week with considerable relative undermining almost circumferentially. This required debridement. I wanted Erik put this in a total contact cast but the patient was in a sling for a gene that we would not have been able Erik get off over the cast. 11/18; small wound with significant relative depth. We have been using endoform and we will put a total contact cast on him for the first time today. 11/20; in for the first obligatory total contact cast we have been using endoform small punched-out area on the right plantar heel seems stable Erik improved 04/22/2023: There is a fair amount of undermining created by some skin and callus. There is some slough on the surface. He does have a small abrasion on his anterior tibial surface and dorsal foot from his cast. [name]/3; wound on the right plantar heel. Again he comes in with undermining this is not a new phenomenon. We've been using endoform and a TCC. [name]/10; right plantar heel. Again significant undermining maceration of the skin. All of this had Erik be removed. Fortunately the wound does not look infected however clearly not improving [name]/19; right plantar heel. Not much improvement in the condition of the wound but it is not worse. He has an undermining area from about 6-9 o'clock about half a centimeter. More worrisome this the granulation tissue is not adherent. There is no palpable bone. However there is still considerable relative drainage. [name]/26/2024: Last week, Iodoflex was placed in the wound Erik try and address the undermined area. This seems Erik have been effective, as I do not identify any undermined portion of the wound. There is slough on the wound surface.  He still has a fair amount of drainage that has resulted in some mild periwound tissue maceration. 05/29/2023; patient with a wound on his plantar heel. This has no undermining today. We have been using Iodoflex Erik address this and it looks like it has been successful. He continues Erik have drainage however in the periwound is macerated 06/05/2023; patient presents for follow-up. We have been using silver  alginate under the total contact cast. Patient has no issues or complaints today. He denies signs of infection. Objective Constitutional respirations regular, non-labored and within target range for patient.. Vitals Time Taken: 3:15 PM, Height: 71 in, Weight: 193 lbs, BMI: 26.9, Temperature: 98 F, Pulse: 69 bpm, Respiratory Rate: 18 breaths/min, Blood Pressure: 145/64 mmHg, Capillary Blood Glucose: 82 mg/dl. Cardiovascular 2+ dorsalis pedis/posterior tibialis pulses. Psychiatric pleasant and cooperative. General Notes: small punched-out area on the right plantar mid heel with Granulation tissue. No signs of surrounding infection. Integumentary (Hair, Skin) Wound #1 status is Open. Original cause of wound was Gradually Appeared. The date acquired was: 01/21/2023. The wound has been in treatment 15 weeks. The wound is located on the Right Calcaneus. The wound measures 0.5cm length x 0.6cm width x 0.3cm depth; 0.236cm^2 area and 0.071cm^3 volume. There is Fat Layer (Subcutaneous Tissue) exposed. There is no tunneling noted, however, there is undermining starting at 9:00 and ending at 1:00 with a maximum distance of 0.2cm. There is a medium amount of serosanguineous drainage noted. The wound margin is distinct with the outline attached Erik the wound  base. There is large (67-100%) pink, pale granulation within the wound bed. There is a small (1-33%) amount of necrotic tissue within the wound bed including Adherent Slough. The periwound skin appearance had no abnormalities noted for color. The periwound  skin appearance exhibited: Callus, Maceration. Periwound temperature was noted as No Abnormality. 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 I recommended continuing with silver  alginate and adding antibiotic ointment. Continue the total contact cast. Procedures Wound #1 Pre-procedure diagnosis of Wound #1 is a Diabetic Wound/Ulcer of the Lower Extremity located on the Right Calcaneus . There was a T Research Scientist (life [employer]) otal Procedure by Erik Raisin, DO. Post procedure Diagnosis Wound #1: Same as Pre-Procedure Plan Follow-up Appointments: Return Appointment in 1 week. - Dr Erik 06/12/2023 1:30pm (already scheduled) Return Appointment in 2 weeks. - Dr. Rosan (already scheduled) Return appointment in 3 weeks. - Dr. Rosan (front office Erik schedule) Anesthetic: Wound #1 Right Calcaneus: (In clinic) Topical Lidocaine  4% applied Erik wound bed Bathing/ Shower/ Hygiene: May shower with protection but do not get wound dressing(s) wet. Protect dressing(s) with water repellant cover (for example, large plastic bag) or a cast cover and may then take shower. - purchase cast protector from [facility], [facility] or [employer] Off-Loading: T Contact Cast Erik Right Lower Extremity - size 3 otal WOUND #1: - Calcaneus Wound Laterality: Right Cleanser: Soap and Water 1 x Per Week/30 Days Discharge Instructions: May shower and wash wound with dial antibacterial soap and water prior Erik dressing change. Cleanser: Vashe 5.8 (oz) 1 x Per Week/30 Days Discharge Instructions: Cleanse the wound with Vashe prior Erik applying a clean dressing using gauze sponges, not tissue or cotton  balls. Peri-Wound Care: Zinc Oxide Ointment 30g tube 1 x Per Week/30 Days Discharge Instructions: Apply Zinc Oxide Erik periwound with each dressing change Topical: Gentamicin 1 x Per Week/30 Days Discharge Instructions: In office- As directed by physician Topical: Mupirocin  Ointment 1 x Per Week/30 Days Discharge Instructions: In Office-Apply Mupirocin  (Bactroban ) as instructed Prim Dressing: Maxorb Extra Ag+ Alginate Dressing, 2x2 (in/in) 1 x Per Week/30 Days ary Discharge Instructions: Apply Erik wound bed as instructed Secondary Dressing: ABD Pad, 8x10 1 x Per Week/30 Days Discharge Instructions: Apply over primary dressing as directed. Secondary Dressing: Zetuvit Plus 4x8 in 1 x Per Week/30 Days Discharge Instructions: Apply over primary dressing as directed. Secondary Dressing: T Contact Cast 1 x Per Week/30 Days otal Discharge Instructions: last layer applied by provider. Secondary Dressing: kerlix and tape 1 x Per Week/30 Days 1. T contact cast placed in standard fashion Erik the right lower extremity otal 2. Silver  alginate with antibiotic ointment 3. Follow-up in 1 week Electronic Signature(s) Signed: 06/10/2023 3:06:42 PM By: Erik Raisin DO Entered By: Erik Raisin on 06/10/2023 13:19:41 -------------------------------------------------------------------------------- Total Contact Cast Details Patient Name: Date of Service: Erik [name] Erik ANTONIA HEATH Erik [name] 06/05/2023 2:30 PM Medical Record Number: [id number] Patient Account Number: [id number] Date of Birth/Sex: Treating RN: 04-Erik-Erik (83 y.o. Erik [name] Primary Care Provider: Tisovec, Erik Other Clinician: Referring Provider: Treating Provider/Extender: Erik [name], Erik [name] ([id number]) [id number] Page 7 of 7 Weeks in Treatment: 15 T Contact Cast Applied for Wound Assessment: otal Wound #1 Right Calcaneus Performed By: Physician Erik Raisin, DO The  following information  was scribed by: Claven [name] The information was scribed for: Erik Raisin Post Procedure Diagnosis Same as Pre-procedure Electronic Signature(s) Signed: 06/05/2023 6:[name]:20 PM By: Claven Pollen RN Signed: 06/10/2023 3:06:42 PM By: Erik Raisin DO Entered By: Claven [name] on 06/05/2023 15:25:02 -------------------------------------------------------------------------------- SuperBill Details Patient Name: Date of Service: Erik [name] Erik ANTONIA HEATH W. 06/05/2023 Medical Record Number: [id number] Patient Account Number: [id number] Date of Birth/Sex: Treating RN: Erik-02-[birth year] (83 y.o. 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 I87.331 Chronic venous hypertension  (idiopathic) with ulcer and inflammation of right lo Modifier: ty wer extremity Quantity: 1 Physician Procedures : CPT4 Code Description Modifier [id number] - WC PHYS APPLY TOTAL CONTACT CAST ICD-10 Diagnosis Description 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

## 2023-06-12 ENCOUNTER — Encounter (HOSPITAL_BASED_OUTPATIENT_CLINIC_OR_DEPARTMENT_OTHER): Payer: PPO | Admitting: Internal Medicine

## 2023-06-12 DIAGNOSIS — L97418 Non-pressure chronic ulcer of right heel and midfoot with other specified severity: Secondary | ICD-10-CM

## 2023-06-12 DIAGNOSIS — E11621 Type 2 diabetes mellitus with foot ulcer: Secondary | ICD-10-CM

## 2023-06-12 NOTE — Progress Notes (Signed)
Erik [name], Erik [name] ([id number]) [id number] Page 1 of 8 Visit Report for 06/12/2023 Chief Complaint Document Details Patient Name: Date of Service: TO [name] 06/12/2023 1:30 PM Medical Record Number: [id number] Patient Account Number: [id number] Date of Birth/Sex: Treating RN: 24-Sep-1940 (83 y.o. M) Primary Care Provider: Guerry Bruin Other Clinician: Referring Provider: Treating Provider/Extender: Morrie Sheldon in Treatment: 16 Information Obtained from: Patient Chief Complaint 02/18/2023; patient is here for review of a wound in the right mid plantar foot Electronic Signature(s) Signed: 06/12/2023 4:08:41 PM By: Geralyn Corwin DO Entered By: Geralyn Corwin on 06/12/2023 14:38:51 -------------------------------------------------------------------------------- Debridement Details Patient Name: Date of Service: TO [name], [name] 06/12/2023 1:30 PM Medical Record Number: [id number] Patient Account Number: [id number] Date of Birth/Sex: Treating RN: [birth year] (83 y.o. Dianna Limbo Primary Care Provider: Guerry Bruin Other Clinician: Referring Provider: Treating Provider/Extender: Morrie Sheldon in Treatment: 16 Debridement Performed for Assessment: Wound #1 Right Calcaneus Performed By: Physician Geralyn Corwin, DO The following information was scribed by: Karie Schwalbe The information was scribed for: Duanne Guess Debridement Type: Debridement Severity of Tissue Pre Debridement: Fat layer exposed Level of Consciousness (Pre-procedure): Awake and Alert Pre-procedure Verification/Time Out Yes - 14:10 Taken: Start Time: 14:10 Pain Control: Lidocaine 4% T opical Solution Percent of Wound Bed Debrided: 100% T Area Debrided (cm): otal 0.13 Tissue and other material debrided: Viable, Non-Viable, Callus, Slough, Skin: Dermis , Skin: Epidermis, Slough Level:  Skin/Epidermis Debridement Description: Selective/Open Wound Instrument: Curette Bleeding: Minimum Hemostasis Achieved: Pressure Response to Treatment: Procedure was tolerated well Level of Consciousness (Post- Awake and Alert procedure): Post Debridement Measurements of Total Wound Length: (cm) 0.4 Width: (cm) 0.4 Depth: (cm) 0.3 Volume: (cm) 0.038 Character of Wound/Ulcer Post Debridement: Improved Severity of Tissue Post Debridement: Fat layer exposed [name] ([id number]) [id number] Page 2 of 8 Post Procedure Diagnosis Same as Pre-procedure Electronic Signature(s) Signed: 06/12/2023 4:08:41 PM By: Geralyn Corwin DO Signed: 06/12/2023 5:02:02 PM By: Karie Schwalbe RN Entered By: Karie Schwalbe on 06/12/2023 14:19:47 -------------------------------------------------------------------------------- HPI Details Patient Name: Date of Service: TO [name], [name] 06/12/2023 1:30 PM Medical Record Number: [id number] Patient Account Number: [id number] Date of Birth/Sex: Treating RN: February 25, 1941 (83 y.o. M) Primary Care Provider: Guerry Bruin Other Clinician: Referring Provider: Treating Provider/Extender: Morrie Sheldon in Treatment: 16 History of Present Illness HPI Description: ADMISSION 02/18/2023 This is an 83 year old still very active man who was referred to [place] by Dr. Ardelle Anton at [facility]. He has a nonhealing wound on the plantar aspect of his right heel. He has been dealing with this since June when he presented to Dr. Loreta Ave with a painful area on the right mid heel. He was not really sure how this happened. He ultimately had an MRI in July that showed no osteomyelitis but there was a small abscess he was treated with antibiotics doxycycline and gradually the pain improved but he still been left with a small open area. He had an ABI on 11/14/2022 that was 1.08 on the right and 0.99 on the left. He  by enlarge has been using Silvadene on the wound. Recently changed to silver alginate. He also developed a blister on the right anterior lower leg. Recent x-rays have not shown any cortical changes. Also in June he had a sedimentation rate of 2 and a C-reactive protein of 14 not suggestive of chronic active infection Past medical history includes type 2 diabetes, nonischemic cardiomyopathy, hypertension cholesterolemia,  skin cancer, combined congestive heart failure, paroxysmal A-fib, idiopathic trigeminal neuralgia. 10/1; ulcer on the plantar aspect of his right heel. He is being compliant with the healing boot. We have been using Hydrofera Blue heel cup gauze. Dimensions are better 10/7; the area on the leg right anterior is healed. He has chronic insufficiency. Unfortunately the ulcer on the plantar aspect of his right heel looks about the same each time with undermining the skin is not adherent to the underlying tissue. This requires debridement. We have been using Hydrofera Blue, heel cup and heel offloading shoe. We are not making progress with the heel ulcer. 10/15; I brought the patient in today with the intention of putting him in a total contact cast if the wound on his plantar heel on the right was no better. However he comes in with erythema spreading towards the tip of his heel. There is also bogginess. There is no crepitus. He is a very active man. He is in the heel off loader but I do not think for 1 reason or another this is doing the job in terms of pressure and friction relief. I am concerned enough about the wound today not to put on a total contact cast 10/22; culture I did last week showed Staphylococcus epidermidis which is likely a skin contaminant but he also grew Staphylococcus capitis which was resistant to the doxycycline I put him on. I am going to give him a week of trimethoprim/sulfamethoxazole for this. I am not sure of the relevance of the coag negative staph however I  do not want to put a cast and something that is infected. We have been using Iodoflex 10/29; patient with a right plantar heel wound. This was initially probably infectious with an MRI showing a small abscess but no osteomyelitis. 2 weeks ago he had erythema around the wound and bogginess but no crepitus I treated him with a prolonged course of antibiotics last week giving and will further week of Septra. We have been using mupirocin and silver alginate. The wound is measuring smaller today but there is undermining at 0.9 cm almost circumferentially 11/7; right plantar heel. This was not any smaller this week with considerable relative undermining almost circumferentially. This required debridement. I wanted to put this in a total contact cast but the patient was in a sling for a gene that we would not have been able to get off over the cast. 11/18; small wound with significant relative depth. We have been using endoform and we will put a total contact cast on him for the first time today. 11/20; in for the first obligatory total contact cast we have been using endoform small punched-out area on the right plantar heel seems stable to improved 04/22/2023: There is a fair amount of undermining created by some skin and callus. There is some slough on the surface. He does have a small abrasion on his anterior tibial surface and dorsal foot from his cast. 12/3; wound on the right plantar heel. Again he comes in with undermining this is not a new phenomenon. We've been using endoform and a TCC. 12/10; right plantar heel. Again significant undermining maceration of the skin. All of this had to be removed. Fortunately the wound does not look infected however clearly not improving 12/19; right plantar heel. Not much improvement in the condition of the wound but it is not worse. He has an undermining area from about 6-9 o'clock about half a centimeter. More worrisome this the granulation tissue is  not adherent.  There is no palpable bone. However there is still considerable relative drainage. 05/22/2023: Last week, Iodoflex was placed in the wound to try and address the undermined area. This seems to have been effective, as I do not identify any undermined portion of the wound. There is slough on the wound surface. He still has a fair amount of drainage that has resulted in some mild periwound tissue maceration. KEYMON, SANDAHL ([id number]) [id number] Page 3 of 8 05/29/2023; patient with a wound on his plantar heel. This has no undermining today. We have been using Iodoflex to address this and it looks like it has been successful. He continues to have drainage however in the periwound is macerated 06/05/2023; patient presents for follow-up. We have been using silver alginate under the total contact cast. Patient has no issues or complaints today. He denies signs of infection. 06/12/2023; patient presents for follow-up. We have been using silver alginate with antibiotic ointment under the total contact cast. He has no issues or complaints today. Wound is smaller. Electronic Signature(s) Signed: 06/12/2023 4:08:41 PM By: Geralyn Corwin DO Entered By: Geralyn Corwin on 06/12/2023 14:39:19 -------------------------------------------------------------------------------- Physical Exam Details Patient Name: Date of Service: TO [name] NK W. 06/12/2023 1:30 PM Medical Record Number: [id number] Patient Account Number: [id number] Date of Birth/Sex: Treating RN: 08/28/1940 (83 y.o. M) Primary Care Provider: Guerry Bruin Other Clinician: Referring Provider: Treating Provider/Extender: Morrie Sheldon in Treatment: 16 Constitutional respirations regular, non-labored and within target range for patient.. Cardiovascular 2+ dorsalis pedis/posterior tibialis pulses. Psychiatric pleasant and cooperative. Notes Small punched-out area on the right plantar  mid heel with Granulation tissue And nonviable tissue. No signs of surrounding infection. Electronic Signature(s) Signed: 06/12/2023 4:08:41 PM By: Geralyn Corwin DO Entered By: Geralyn Corwin on 06/12/2023 14:40:00 -------------------------------------------------------------------------------- Physician Orders Details Patient Name: Date of Service: TO [name], [name] 06/12/2023 1:30 PM Medical Record Number: [id number] Patient Account Number: [id number] Date of Birth/Sex: Treating RN: [birth year] (83 y.o. 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 - Dr Mikey Bussing 06/19/2023 1:30pm (already scheduled) Return A TYELOR, PARRALES ([id number]) [phone].pdf Page 4 of 8 ppointment in 2 weeks. - Dr. Mikey Bussing (already scheduled) Return A Return appointment in 3 weeks. - Dr. Mikey Bussing (front office to schedule) Anesthetic Wound #1 Right Calcaneus (In clinic) Topical Lidocaine 4% applied to wound bed Bathing/ Shower/ Hygiene May shower with protection but do not get wound dressing(s) wet. Protect dressing(s) with water repellant cover (for example, large plastic bag) or a cast cover and may then take shower. - purchase cast protector from [facility], [facility] or [employer] Off-Loading Total Contact Cast to Right Lower Extremity - size 3 Wound Treatment Wound #1 - Calcaneus Wound Laterality: Right Cleanser: Soap and Water 1 x Per Week/30 Days Discharge Instructions: May shower and wash wound with dial  antibacterial soap and water prior to dressing change. Cleanser: Vashe 5.8 (oz) 1 x Per Week/30 Days Discharge Instructions: Cleanse the wound with Vashe prior to applying a clean dressing using gauze sponges, not tissue or cotton balls. Peri-Wound Care: Zinc Oxide Ointment 30g tube 1 x Per Week/30  Days Discharge Instructions: Apply Zinc Oxide to periwound with each dressing change Topical: Gentamicin 1 x Per Week/30 Days Discharge Instructions: In office- As directed by physician Topical: Mupirocin Ointment 1 x Per Week/30 Days Discharge Instructions: In Office-Apply Mupirocin (Bactroban) as instructed Prim Dressing: Maxorb Extra Ag+ Alginate Dressing, 2x2 (in/in) 1 x Per Week/30 Days ary Discharge Instructions: Apply to wound bed as instructed Secondary Dressing: ABD Pad, 8x10 1 x Per Week/30 Days Discharge Instructions: Apply over primary dressing as directed. Secondary Dressing: Zetuvit Plus 4x8 in 1 x Per Week/30 Days Discharge Instructions: Apply over primary dressing as directed. Secondary Dressing: T Contact Cast 1 x Per Week/30 Days otal Discharge Instructions: last layer applied by provider. Secondary Dressing: kerlix and tape 1 x Per Week/30 Days Electronic Signature(s) Signed: 06/12/2023 4:08:41 PM By: Geralyn Corwin DO Entered By: Geralyn Corwin on 06/12/2023 15:16:32 -------------------------------------------------------------------------------- Problem List Details Patient Name: Date of Service: TO [name], [name] 06/12/2023 1:30 PM Medical Record Number: [id number] Patient Account Number: [id number] Date of Birth/Sex: Treating RN: Aug 29, 1940 (83 y.o. M) Primary Care Provider: Guerry Bruin Other Clinician: Referring Provider: Treating Provider/Extender: Morrie Sheldon in Treatment: 16 Active Problems ICD-10 Encounter Code Description Active Date MDM Diagnosis L97.418 Non-pressure chronic ulcer of right heel and midfoot with other  specified 02/18/2023 No Yes severity YOUSIF, MICHAELSEN ([id number]) [phone].pdf Page 5 of 8 E11.621 Type 2 diabetes mellitus with foot ulcer 02/18/2023 No Yes I87.331 Chronic venous hypertension (idiopathic) with ulcer and inflammation of right 02/18/2023 No Yes lower extremity Inactive Problems ICD-10 Code Description Active Date Inactive Date L97.811 Non-pressure chronic ulcer of other part of right lower leg limited to breakdown of skin 02/18/2023 02/18/2023 Resolved Problems Electronic Signature(s) Signed: 06/12/2023 4:08:41 PM By: Geralyn Corwin DO Entered By: Geralyn Corwin on 06/12/2023 14:38:34 -------------------------------------------------------------------------------- Progress Note Details Patient Name: Date of Service: TO [name], [name] 06/12/2023 1:30 PM Medical Record Number: [id number] Patient Account Number: [id number] Date of Birth/Sex: Treating RN: 02-Mar-1941 (83 y.o. M) Primary Care Provider: Guerry Bruin Other Clinician: Referring Provider: Treating Provider/Extender: Morrie Sheldon in Treatment: 16 Subjective Chief Complaint Information obtained from Patient 02/18/2023; patient is here for review of a wound in the right mid plantar foot History of Present Illness (HPI) ADMISSION 02/18/2023 This is an 83 year old still very active man who was referred to [place] by Dr. Ardelle Anton at [facility]. He has a nonhealing wound on the plantar aspect of his right heel. He has been dealing with this since June when he presented to Dr. Loreta Ave with a painful area on the right mid heel. He was not really sure how this happened. He ultimately had an MRI in July that showed no osteomyelitis but there was a small abscess he was treated with antibiotics doxycycline and gradually the pain improved but he still been left with a small open area. He had an ABI on 11/14/2022 that was 1.08 on the right and 0.99 on the left. He  by enlarge has been using Silvadene on the wound. Recently changed to silver alginate. He also developed a blister on the right anterior lower leg. Recent x-rays have not shown any cortical changes. Also in June he had a sedimentation rate of 2 and a C-reactive protein of 14 not suggestive of chronic active infection Past medical history includes type 2 diabetes, nonischemic cardiomyopathy, hypertension cholesterolemia, skin cancer, combined congestive heart failure, paroxysmal A-fib, idiopathic trigeminal neuralgia. 10/1; ulcer on the plantar aspect of his  right heel. He is being compliant with the healing boot. We have been using Hydrofera Blue heel cup gauze. Dimensions are better 10/7; the area on the leg right anterior is healed. He has chronic insufficiency. Unfortunately the ulcer on the plantar aspect of his right heel looks about the same each time with undermining the skin is not adherent to the underlying tissue. This requires debridement. We have been using Hydrofera Blue, heel cup and heel offloading shoe. We are not making progress with the heel ulcer. 10/15; I brought the patient in today with the intention of putting him in a total contact cast if the wound on his plantar heel on the right was no better. However he comes in with erythema spreading towards the tip of his heel. There is also bogginess. There is no crepitus. He is a very active man. He is in the heel off loader but I do not think for 1 reason or another this is doing the job in terms of pressure and friction relief. I am concerned enough about the wound today not to put on a total contact cast 10/22; culture I did last week showed Staphylococcus epidermidis which is likely a skin contaminant but he also grew Staphylococcus capitis which was resistant to the doxycycline I put him on. I am going to give him a week of trimethoprim/sulfamethoxazole for this. I am not sure of the relevance of the coag negative staph however I  do not want to put a cast and something that is infected. We have been using TIMMOTHY, NONNEMACHER ([id number]) [id number] Page 6 of 8 10/29; patient with a right plantar heel wound. This was initially probably infectious with an MRI showing a small abscess but no osteomyelitis. 2 weeks ago he had erythema around the wound and bogginess but no crepitus I treated him with a prolonged course of antibiotics last week giving and will further week of Septra. We have been using mupirocin and silver alginate. The wound is measuring smaller today but there is undermining at 0.9 cm almost circumferentially 11/7; right plantar heel. This was not any smaller this week with considerable relative undermining almost circumferentially. This required debridement. I wanted to put this in a total contact cast but the patient was in a sling for a gene that we would not have been able to get off over the cast. 11/18; small wound with significant relative depth. We have been using endoform and we will put a total contact cast on him for the first time today. 11/20; in for the first obligatory total contact cast we have been using endoform small punched-out area on the right plantar heel seems stable to improved 04/22/2023: There is a fair amount of undermining created by some skin and callus. There is some slough on the surface. He does have a small abrasion on his anterior tibial surface and dorsal foot from his cast. 12/3; wound on the right plantar heel. Again he comes in with undermining this is not a new phenomenon. We've been using endoform and a TCC. 12/10; right plantar heel. Again significant undermining maceration of the skin. All of this had to be removed. Fortunately the wound does not look infected however clearly not improving 12/19; right plantar heel. Not much improvement in the condition of the wound but it is not worse. He has an undermining area from about 6-9 o'clock  about half a centimeter. More worrisome this the granulation tissue is not adherent. There is no palpable bone. However there  is still considerable relative drainage. 05/22/2023: Last week, Iodoflex was placed in the wound to try and address the undermined area. This seems to have been effective, as I do not identify any undermined portion of the wound. There is slough on the wound surface. He still has a fair amount of drainage that has resulted in some mild periwound tissue maceration. 05/29/2023; patient with a wound on his plantar heel. This has no undermining today. We have been using Iodoflex to address this and it looks like it has been successful. He continues to have drainage however in the periwound is macerated 06/05/2023; patient presents for follow-up. We have been using silver alginate under the total contact cast. Patient has no issues or complaints today. He denies signs of infection. 06/12/2023; patient presents for follow-up. We have been using silver alginate with antibiotic ointment under the total contact cast. He has no issues or complaints today. Wound is smaller. Objective Constitutional respirations regular, non-labored and within target range for patient.. Vitals Time Taken: 2:10 PM, Height: 71 in, Weight: 193 lbs, BMI: 26.9, Temperature: 97.7 F, Pulse: 66 bpm, Respiratory Rate: 18 breaths/min, Blood Pressure: 156/76 mmHg, Capillary Blood Glucose: 109 mg/dl. Cardiovascular 2+ dorsalis pedis/posterior tibialis pulses. Psychiatric pleasant and cooperative. General Notes: Small punched-out area on the right plantar mid heel with Granulation tissue And nonviable tissue. No signs of surrounding infection. Integumentary (Hair, Skin) Wound #1 status is Open. Original cause of wound was Gradually Appeared. The date acquired was: 01/21/2023. The wound has been in treatment 16 weeks. The wound is located on the Right Calcaneus. The wound measures 0.4cm length x 0.4cm width x 0.3cm  depth; 0.126cm^2 area and 0.038cm^3 volume. There is Fat Layer (Subcutaneous Tissue) exposed. There is no tunneling or undermining noted. There is a medium amount of serosanguineous drainage noted. The wound margin is distinct with the outline attached to the wound base. There is large (67-100%) pink, pale granulation within the wound bed. There is a small (1- 33%) amount of necrotic tissue within the wound bed including Adherent Slough. The periwound skin appearance had no abnormalities noted for color. The periwound skin appearance exhibited: Callus, Maceration. Periwound temperature was noted as No Abnormality. 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 I debrided nonviable tissue. I recommended continuing the course with antibiotic ointment and silver alginate under the total contact cast. Follow-up in 1 week. Procedures BYRD, BUSS ([id number]) [phone].pdf Page 7 of 8 Wound #1 Pre-procedure diagnosis of Wound #1 is a Diabetic Wound/Ulcer of the Lower Extremity located on the Right Calcaneus .Severity of Tissue Pre Debridement is: Fat layer exposed. There was a Selective/Open Wound Skin/Epidermis Debridement with a total area of 0.13 sq cm performed by Geralyn Corwin, DO. With the following instrument(s): Curette to remove Viable and Non-Viable tissue/material. Material removed includes Callus, Slough, Skin: Dermis, and Skin: Epidermis after achieving pain control using Lidocaine 4% Topical Solution. No specimens were taken. A time out was conducted at 14:10, prior to the start of the procedure. A Minimum amount of bleeding was controlled with Pressure. The procedure  was tolerated well. Post Debridement Measurements: 0.4cm length x 0.4cm width x 0.3cm depth; 0.038cm^3 volume. Character of Wound/Ulcer Post Debridement is improved. Severity of Tissue Post Debridement is: Fat layer exposed. Post  procedure Diagnosis Wound #1: Same as Pre-Procedure Pre-procedure diagnosis of Wound #1 is a Diabetic Wound/Ulcer of the Lower Extremity located on the Right Calcaneus . There was a T Research scientist (life sciences) otal Procedure by Geralyn Corwin, DO. Post procedure Diagnosis Wound #1: Same as Pre-Procedure Plan Follow-up Appointments: Return Appointment in 1 week. - Dr Mikey Bussing 06/19/2023 1:30pm (already scheduled) Return Appointment in 2 weeks. - Dr. Mikey Bussing (already scheduled) Return appointment in 3 weeks. - Dr. Mikey Bussing (front office to schedule) Anesthetic: Wound #1 Right Calcaneus: (In clinic) Topical Lidocaine 4% applied to wound bed Bathing/ Shower/ Hygiene: May shower with protection but do not get wound dressing(s) wet. Protect dressing(s) with water repellant cover (for example, large plastic bag) or a cast cover and may then take shower. - purchase cast protector from [facility], [facility] or [employer] Off-Loading: T Contact Cast to Right Lower Extremity - size 3 otal WOUND #1: - Calcaneus Wound Laterality: Right Cleanser: Soap and Water 1 x Per Week/30 Days Discharge Instructions: May shower and wash wound with dial antibacterial soap and water prior to dressing change. Cleanser: Vashe 5.8 (oz) 1 x Per Week/30 Days Discharge Instructions: Cleanse the wound with Vashe prior to applying a clean dressing using gauze sponges, not tissue or cotton balls. Peri-Wound Care: Zinc Oxide Ointment 30g tube 1 x Per Week/30 Days Discharge Instructions: Apply Zinc Oxide to periwound with each dressing change Topical: Gentamicin 1 x Per Week/30 Days Discharge Instructions: In office- As directed by physician Topical: Mupirocin Ointment 1 x Per Week/30 Days Discharge Instructions: In  Office-Apply Mupirocin (Bactroban) as instructed Prim Dressing: Maxorb Extra Ag+ Alginate Dressing, 2x2 (in/in) 1 x Per Week/30 Days ary Discharge Instructions: Apply to wound bed as instructed Secondary Dressing: ABD Pad, 8x10 1 x Per Week/30 Days Discharge Instructions: Apply over primary dressing as directed. Secondary Dressing: Zetuvit Plus 4x8 in 1 x Per Week/30 Days Discharge Instructions: Apply over primary dressing as directed. Secondary Dressing: T Contact Cast 1 x Per Week/30 Days otal Discharge Instructions: last layer applied by provider. Secondary Dressing: kerlix and tape 1 x Per Week/30 Days 1. T contact cast placed in standard fashion to the right lower extremity otal 2. Silver alginate with antibiotic ointment 3. Follow-up in 1 week 4. In office sharp debridement Electronic Signature(s) Signed: 06/12/2023 4:08:41 PM By: Geralyn Corwin DO Entered By: Geralyn Corwin on 06/12/2023 15:16:41 -------------------------------------------------------------------------------- Total Contact Cast Details Patient Name: Date of Service: TO [name] 06/12/2023 1:30 PM Medical Record Number: [id number] Patient Account Number: [id number] Date of Birth/Sex: Treating RN: 12-24-1940 (83 y.o. Dianna Limbo Primary Care Provider: Guerry Bruin Other Clinician: Referring Provider: Treating Provider/Extender: Morrie Sheldon in Treatment: [address], Robbie Louis ([id number]) [id number] Page 8 of 8 T Contact Cast Applied for Wound Assessment: otal Wound #1 Right Calcaneus Performed By: Physician Geralyn Corwin, DO Post Procedure Diagnosis Same as Pre-procedure Electronic Signature(s) Signed: 06/12/2023 4:08:41 PM By: Geralyn Corwin DO Signed: 06/12/2023 5:02:02 PM By: Karie Schwalbe RN Entered By: Karie Schwalbe on 06/12/2023  14:20:00 -------------------------------------------------------------------------------- SuperBill Details Patient Name: Date of Service: TO [name] 06/12/2023 Medical Record Number: [id number] Patient Account Number: [id number] Date of Birth/Sex: Treating RN: 03-20-1941 (83 y.o. 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

## 2023-06-17 NOTE — Progress Notes (Signed)
PAULETTE, PIERSMA ([id number]) [id number] Page 1 of 7 Visit Report for 06/12/2023 Arrival Information Details Patient Name: Date of Service: TO [name] W. 06/12/2023 1:30 PM Medical Record Number: [id number] Patient Account Number: [id number] Date of Birth/Sex: Treating RN: [birth year] (83 y.o. M) Primary Care Erik [name]: [name] Other Clinician: Referring Erik [name]: Treating Erik [name]/Extender: [name] in Treatment: 16 Visit Information History Since Last Visit Added or deleted any medications: No Patient Arrived: Cane Any new allergies or adverse reactions: No Arrival Time: 13:41 Had a fall or experienced change in No Accompanied By: self activities of daily living that may affect Transfer Assistance: None risk of falls: Patient Identification Verified: Yes Signs or symptoms of abuse/neglect since last No Secondary Verification Process Completed: Yes visito Patient Requires Transmission-Based Precautions: No Hospitalized since last visit: No Patient Has Alerts: Yes Implantable device outside of the clinic No Patient Alerts: ABI: R 1.08 11/14/22 excluding cellular tissue based products placed in the center since last visit: Has Dressing in Place as Prescribed: Yes Has Footwear/Offloading in Place as Yes Prescribed: Right: Removable Cast Walker/Walking Boot T Contact Cast otal Pain Present Now: No Electronic Signature(s) Signed: 06/17/2023 4:04:36 PM By: [name] Entered By: Erik [name] on 06/12/2023 13:45:53 -------------------------------------------------------------------------------- Encounter Discharge Information Details Patient Name: Date of Service: TO Erik Range, [name] 06/12/2023 1:30 PM Medical Record Number: [id number] Patient Account Number: [id number] Date of Birth/Sex: Treating RN: 1940-07-20 (83 y.o. [name] Primary Care Tresia Revolorio: [name] Other  Clinician: Referring Santiel Topper: Treating Shakeerah Gradel/Extender: [name] in Treatment: 16 Encounter Discharge Information Items Post Procedure Vitals Discharge Condition: Stable Temperature (F): 97.7 Ambulatory Status: Walker Pulse (bpm): 66 Discharge Destination: Home Respiratory Rate (breaths/min): 18 Transportation: Private Auto Blood Pressure (mmHg): 156/76 Accompanied By: spouse Schedule Follow-up Appointment: Yes Clinical Summary of Care: Patient Declined Electronic Signature(s) Signed: 06/12/2023 5:02:02 PM By: Erik Schwalbe RN Entered By: [name] on 06/12/2023 15:04:26 [name] ([id number]) [id number] Page 2 of 7 -------------------------------------------------------------------------------- Lower Extremity Assessment Details Patient Name: Date of Service: TO Erik [name]. 06/12/2023 1:30 PM Medical Record Number: [id number] Patient Account Number: [id number] Date of Birth/Sex: Treating RN: Jul 20, 1940 (83 y.o. M) Primary Care Bodie Abernethy: [name] Other Clinician: Referring Nicola Heinemann: Treating Corran Lalone/Extender: [name] in Treatment: 16 Edema Assessment Assessed: [Left: No] [Right: No] Edema: [Left: N] [Right: o] Calf Left: Right: Point of Measurement: From Medial Instep 40.5 cm Ankle Left: Right: Point of Measurement: From Medial Instep 27.2 cm Vascular Assessment Extremity colors, hair growth, and conditions: Extremity Color: [Right:Normal] Hair Growth on Extremity: [Right:No] Temperature of Extremity: [Right:Warm] Capillary Refill: [Right:< 3 seconds] Dependent Rubor: [Right:No Yes] Toe Nail Assessment Left: Right: Thick: No Discolored: No Deformed: No Improper Length and Hygiene: Yes Electronic Signature(s) Signed: 06/17/2023 4:04:36 PM By: [name] Entered By: [name] on 06/12/2023  14:00:06 -------------------------------------------------------------------------------- Multi Wound Chart Details Patient Name: Date of Service: TO Erik Range, [name] 06/12/2023 1:30 PM Medical Record Number: [id number] Patient Account Number: [id number] Date of Birth/Sex: Treating RN: December 30, 1940 (83 y.o. M) Primary Care Catheryne Deford: [name] Other Clinician: Referring Nidya Bouyer: Treating Bailea Beed/Extender: [name] in Treatment: 16 Vital Signs Height(in): 71 Capillary Blood Glucose(mg/dl): 601 Weight(lbs): 093 Pulse(bpm): 66 Body Mass Index(BMI): 26.9 Blood Pressure(mmHg): 156/76 Temperature(F): 97.7 [name] ([id number]) [id number].pdf Page 3 of 7 Respiratory Rate(breaths/min): 18 [1:Photos:] [N/A:N/A] Right Calcaneus N/A N/A Wound Location: Gradually Appeared N/A N/A Wounding Event: Diabetic Wound/Ulcer  of the Lower N/A N/A Primary Etiology: Extremity Anemia, Arrhythmia, Coronary Artery N/A N/A Comorbid History: Disease, Hypertension, Peripheral Arterial Disease, Type II Diabetes, Neuropathy 01/21/2023 N/A N/A Date Acquired: 16 N/A N/A Weeks of Treatment: Open N/A N/A Wound Status: No N/A N/A Wound Recurrence: 0.4x0.4x0.3 N/A N/A Measurements L x W x D (cm) 0.126 N/A N/A A (cm) : rea 0.038 N/A N/A Volume (cm) : 10.60% N/A N/A % Reduction in A rea: -35.70% N/A N/A % Reduction in Volume: Grade 2 N/A N/A Classification: Medium N/A N/A Exudate A mount: Serosanguineous N/A N/A Exudate Type: red, brown N/A N/A Exudate Color: Distinct, outline attached N/A N/A Wound Margin: Large (67-100%) N/A N/A Granulation A mount: Pink, Pale N/A N/A Granulation Quality: Small (1-33%) N/A N/A Necrotic A mount: Fat Layer (Subcutaneous Tissue): Yes N/A N/A Exposed Structures: Fascia: No Tendon: No Muscle: No Joint: No Bone: No Medium (34-66%) N/A N/A Epithelialization: Debridement - Selective/Open  Wound N/A N/A Debridement: Pre-procedure Verification/Time Out 14:10 N/A N/A Taken: Lidocaine 4% T opical Solution N/A N/A Pain Control: Callus, Slough N/A N/A Tissue Debrided: Skin/Epidermis N/A N/A Level: 0.13 N/A N/A Debridement A (sq cm): rea Curette N/A N/A Instrument: Minimum N/A N/A Bleeding: Pressure N/A N/A Hemostasis A chieved: Procedure was tolerated well N/A N/A Debridement Treatment Response: 0.4x0.4x0.3 N/A N/A Post Debridement Measurements L x W x D (cm) 0.038 N/A N/A Post Debridement Volume: (cm) Callus: Yes N/A N/A Periwound Skin Texture: Maceration: Yes N/A N/A Periwound Skin Moisture: No Abnormalities Noted N/A N/A Periwound Skin Color: No Abnormality N/A N/A Temperature: Debridement N/A N/A Procedures Performed: T Contact Cast otal Treatment Notes Electronic Signature(s) Signed: 06/12/2023 4:08:41 PM By: Geralyn Corwin DO Entered By: Geralyn Corwin on 06/12/2023 14:38:42 [name] ([id number]) [id number] Page 4 of 7 -------------------------------------------------------------------------------- Multi-Disciplinary Care Plan Details Patient Name: Date of Service: TO Erik [name]. 06/12/2023 1:30 PM Medical Record Number: [id number] Patient Account Number: [id number] Date of Birth/Sex: Treating RN: 1941-04-23 (83 y.o. [name] Primary Care Geraldine Tesar: [name] Other Clinician: Referring Osie Amparo: Treating Dennie Vecchio/Extender: [name] in Treatment: 16 Multidisciplinary Care Plan reviewed with physician Active Inactive Wound/Skin Impairment Nursing Diagnoses: Impaired tissue integrity Knowledge deficit related to ulceration/compromised skin integrity Goals: Patient/caregiver will verbalize understanding of skin care regimen Date Initiated: 02/18/2023 Target Resolution Date: 08/23/2023 Goal Status: Active Ulcer/skin breakdown will have a volume reduction of  30% by week 4 Date Initiated: 02/18/2023 Date Inactivated: 05/22/2023 Target Resolution Date: 07/25/2023 Unmet Reason: see wound Goal Status: Unmet measurement. Interventions: Assess patient/caregiver ability to obtain necessary supplies Assess patient/caregiver ability to perform ulcer/skin care regimen upon admission and as needed Assess ulceration(s) every visit Provide education on ulcer and skin care Notes: Electronic Signature(s) Signed: 06/12/2023 5:02:02 PM By: Erik Schwalbe RN Entered By: [name] on 06/12/2023 15:00:34 -------------------------------------------------------------------------------- Pain Assessment Details Patient Name: Date of Service: TO [name] 06/12/2023 1:30 PM Medical Record Number: [id number] Patient Account Number: [id number] Date of Birth/Sex: Treating RN: [birth year] (83 y.o. M) Primary Care Georgiann Neider: [name] Other Clinician: Referring Sheron Tallman: Treating Dominic Rhome/Extender: [name] in Treatment: 16 Active Problems Location of Pain Severity and Description of Pain Patient Has Paino No Site Locations NOTNAMED, [place] ([id number]) [id number] Page 5 of 7 Pain Management and Medication Current Pain Management: Electronic Signature(s) Signed: 06/17/2023 4:04:36 PM By: [name] Entered By: Erik [name] on 06/12/2023 13:46:04 -------------------------------------------------------------------------------- Patient/Caregiver Education Details Patient Name: Date of Service: TO WNSO N, FRA NK  W. 1/16/2025andnbsp1:30 PM Medical Record Number: [id number] Patient Account Number: [id number] Date of Birth/Gender: Treating RN: [birth year] (83 y.o. [name] Primary Care Physician: [name] Other Clinician: Referring Physician: Treating Physician/Extender: [name] in Treatment: 16 Education Assessment Education Provided  To: Patient Education Topics Provided Wound/Skin Impairment: Methods: Explain/Verbal Responses: State content correctly [employer]) Signed: 06/12/2023 5:02:02 PM By: Erik Schwalbe RN Entered By: [facility] [name] on 06/12/2023 15:00:48 -------------------------------------------------------------------------------- Wound Assessment Details Patient Name: Date of Service: TO [name] 06/12/2023 1:30 PM Medical Record Number: [id number] Patient Account Number: [id number] Date of Birth/Sex: Treating RN: 1940-06-26 (83 y.o. M) Primary Care Akiera Allbaugh: [name] Other Clinician: Referring Atthew Coutant: Treating Atticus Lemberger/Extender: Jaxston, Mancusi ([id number]) [id number] Page 6 of 7 Weeks in Treatment: 16 Wound Status Wound Number: 1 Primary Diabetic Wound/Ulcer of the Lower Extremity Etiology: Wound Location: Right Calcaneus Wound Open Wounding Event: Gradually Appeared Status: Date Acquired: 01/21/2023 Comorbid Anemia, Arrhythmia, Coronary Artery Disease, Hypertension, Weeks Of Treatment: 16 History: Peripheral Arterial Disease, Type II Diabetes, Neuropathy Clustered Wound: No Photos Wound Measurements Length: (cm) 0.4 Width: (cm) 0.4 Depth: (cm) 0.3 Area: (cm) 0.126 Volume: (cm) 0.038 % Reduction in Area: 10.6% % Reduction in Volume: -35.7% Epithelialization: Medium (34-66%) Tunneling: No Undermining: No Wound Description Classification: Grade 2 Wound Margin: Distinct, outline attached Exudate Amount: Medium Exudate Type: Serosanguineous Exudate Color: red, brown Foul Odor After Cleansing: No Slough/Fibrino Yes Wound Bed Granulation Amount: Large (67-100%) Exposed Structure Granulation Quality: Pink, Pale Fascia Exposed: No Necrotic Amount: Small (1-33%) Fat Layer (Subcutaneous Tissue) Exposed: Yes Necrotic Quality: Adherent Slough Tendon Exposed: No Muscle Exposed: No Joint Exposed:  No Bone Exposed: No Periwound Skin Texture Texture Color No Abnormalities Noted: No No Abnormalities Noted: Yes Callus: Yes Temperature / Pain Temperature: No Abnormality Moisture No Abnormalities Noted: No Maceration: Yes Treatment Notes Wound #1 (Calcaneus) Wound Laterality: Right Cleanser Soap and Water Discharge Instruction: May shower and wash wound with dial antibacterial soap and water prior to dressing change. Vashe 5.8 (oz) Discharge Instruction: Cleanse the wound with Vashe prior to applying a clean dressing using gauze sponges, not tissue or cotton balls. Peri-Wound Care Zinc Oxide Ointment 30g tube Discharge Instruction: Apply Zinc Oxide to periwound with each dressing change Topical Gentamicin Discharge Instruction: In office- As directed by physician [name] ([id number]) [id number] Page 7 of 7 Mupirocin Ointment Discharge Instruction: In Office-Apply Mupirocin (Bactroban) as instructed Primary Dressing Maxorb Extra Ag+ Alginate Dressing, 2x2 (in/in) Discharge Instruction: Apply to wound bed as instructed Secondary Dressing ABD Pad, 8x10 Discharge Instruction: Apply over primary dressing as directed. Zetuvit Plus 4x8 in Discharge Instruction: Apply over primary dressing as directed. T Contact Cast otal Discharge Instruction: last layer applied by Rishawn Walck. kerlix and tape Secured With Compression Wrap Compression Stockings Add-Ons Electronic Signature(s) Signed: 06/17/2023 4:04:36 PM By: [name] Entered By: Erik [name] on 06/12/2023 14:01:36 -------------------------------------------------------------------------------- Vitals Details Patient Name: Date of Service: TO Erik Range, [name] 06/12/2023 1:30 PM Medical Record Number: [id number] Patient Account Number: [id number] Date of Birth/Sex: Treating RN: 1940-10-25 (83 y.o. M) Primary Care Jalessa Peyser: [name] Other Clinician: Referring  Dhiren Azimi: Treating Jovon Winterhalter/Extender: [name] in Treatment: 16 Vital Signs Time Taken: 14:10 Temperature (F): 97.7 Height (in): 71 Pulse (bpm): 66 Weight (lbs): 193 Respiratory Rate (breaths/min): 18 Body Mass Index (BMI): 26.9 Blood Pressure (mmHg): 156/76 Capillary Blood Glucose (mg/dl): 244 Reference Range: 80 - 120 mg / dl Electronic Signature(s) Signed: 06/17/2023  4:04:36 PM By: [name] Entered By: Erik [name] on 06/12/2023 14:10:56

## 2023-06-19 ENCOUNTER — Encounter (HOSPITAL_BASED_OUTPATIENT_CLINIC_OR_DEPARTMENT_OTHER): Payer: PPO | Admitting: Internal Medicine

## 2023-06-19 DIAGNOSIS — E11621 Type 2 diabetes mellitus with foot ulcer: Secondary | ICD-10-CM

## 2023-06-19 DIAGNOSIS — L97418 Non-pressure chronic ulcer of right heel and midfoot with other specified severity: Secondary | ICD-10-CM | POA: Diagnosis not present

## 2023-06-26 ENCOUNTER — Encounter (HOSPITAL_BASED_OUTPATIENT_CLINIC_OR_DEPARTMENT_OTHER): Payer: PPO | Admitting: Internal Medicine

## 2023-06-27 ENCOUNTER — Encounter (HOSPITAL_BASED_OUTPATIENT_CLINIC_OR_DEPARTMENT_OTHER): Payer: PPO | Admitting: General Surgery

## 2023-06-27 DIAGNOSIS — E11621 Type 2 diabetes mellitus with foot ulcer: Secondary | ICD-10-CM | POA: Diagnosis not present

## 2023-07-03 ENCOUNTER — Encounter (HOSPITAL_BASED_OUTPATIENT_CLINIC_OR_DEPARTMENT_OTHER): Payer: PPO | Attending: Internal Medicine | Admitting: Internal Medicine

## 2023-07-03 DIAGNOSIS — E11621 Type 2 diabetes mellitus with foot ulcer: Secondary | ICD-10-CM | POA: Diagnosis not present

## 2023-07-03 DIAGNOSIS — I87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: Secondary | ICD-10-CM | POA: Diagnosis not present

## 2023-07-03 DIAGNOSIS — L97418 Non-pressure chronic ulcer of right heel and midfoot with other specified severity: Secondary | ICD-10-CM | POA: Diagnosis not present

## 2023-07-10 ENCOUNTER — Encounter (HOSPITAL_BASED_OUTPATIENT_CLINIC_OR_DEPARTMENT_OTHER): Payer: PPO | Admitting: Internal Medicine

## 2023-07-10 DIAGNOSIS — L97418 Non-pressure chronic ulcer of right heel and midfoot with other specified severity: Secondary | ICD-10-CM | POA: Diagnosis not present

## 2023-07-10 DIAGNOSIS — E11621 Type 2 diabetes mellitus with foot ulcer: Secondary | ICD-10-CM

## 2023-07-10 DIAGNOSIS — I87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: Secondary | ICD-10-CM | POA: Diagnosis not present

## 2023-07-17 ENCOUNTER — Encounter (HOSPITAL_BASED_OUTPATIENT_CLINIC_OR_DEPARTMENT_OTHER): Payer: PPO | Admitting: Internal Medicine

## 2023-07-23 ENCOUNTER — Other Ambulatory Visit: Payer: Self-pay

## 2023-07-23 ENCOUNTER — Encounter (HOSPITAL_COMMUNITY): Payer: Self-pay

## 2023-07-23 ENCOUNTER — Emergency Department (HOSPITAL_COMMUNITY): Payer: PPO

## 2023-07-23 ENCOUNTER — Inpatient Hospital Stay (HOSPITAL_COMMUNITY)
Admission: EM | Admit: 2023-07-23 | Discharge: 2023-07-28 | DRG: 177 | Disposition: A | Discharge status: Home Health | Payer: PPO | Source: Ambulatory Visit | Attending: Family Medicine | Admitting: Family Medicine

## 2023-07-23 DIAGNOSIS — Z7901 Long term (current) use of anticoagulants: Secondary | ICD-10-CM

## 2023-07-23 DIAGNOSIS — I5042 Chronic combined systolic (congestive) and diastolic (congestive) heart failure: Secondary | ICD-10-CM | POA: Diagnosis present

## 2023-07-23 DIAGNOSIS — I5022 Chronic systolic (congestive) heart failure: Secondary | ICD-10-CM | POA: Diagnosis present

## 2023-07-23 DIAGNOSIS — E1129 Type 2 diabetes mellitus with other diabetic kidney complication: Secondary | ICD-10-CM | POA: Diagnosis not present

## 2023-07-23 DIAGNOSIS — Z794 Long term (current) use of insulin: Secondary | ICD-10-CM | POA: Diagnosis not present

## 2023-07-23 DIAGNOSIS — Z833 Family history of diabetes mellitus: Secondary | ICD-10-CM

## 2023-07-23 DIAGNOSIS — R070 Pain in throat: Secondary | ICD-10-CM | POA: Diagnosis not present

## 2023-07-23 DIAGNOSIS — Z7984 Long term (current) use of oral hypoglycemic drugs: Secondary | ICD-10-CM

## 2023-07-23 DIAGNOSIS — E1142 Type 2 diabetes mellitus with diabetic polyneuropathy: Secondary | ICD-10-CM | POA: Diagnosis present

## 2023-07-23 DIAGNOSIS — R0902 Hypoxemia: Secondary | ICD-10-CM | POA: Diagnosis not present

## 2023-07-23 DIAGNOSIS — R051 Acute cough: Secondary | ICD-10-CM | POA: Diagnosis not present

## 2023-07-23 DIAGNOSIS — N401 Enlarged prostate with lower urinary tract symptoms: Secondary | ICD-10-CM | POA: Diagnosis present

## 2023-07-23 DIAGNOSIS — R062 Wheezing: Secondary | ICD-10-CM | POA: Diagnosis not present

## 2023-07-23 DIAGNOSIS — N1832 Chronic kidney disease, stage 3b: Secondary | ICD-10-CM | POA: Diagnosis not present

## 2023-07-23 DIAGNOSIS — R0981 Nasal congestion: Secondary | ICD-10-CM | POA: Diagnosis not present

## 2023-07-23 DIAGNOSIS — Z888 Allergy status to other drugs, medicaments and biological substances status: Secondary | ICD-10-CM

## 2023-07-23 DIAGNOSIS — E119 Type 2 diabetes mellitus without complications: Secondary | ICD-10-CM

## 2023-07-23 DIAGNOSIS — J9601 Acute respiratory failure with hypoxia: Principal | ICD-10-CM | POA: Diagnosis present

## 2023-07-23 DIAGNOSIS — E1122 Type 2 diabetes mellitus with diabetic chronic kidney disease: Secondary | ICD-10-CM | POA: Diagnosis present

## 2023-07-23 DIAGNOSIS — E1151 Type 2 diabetes mellitus with diabetic peripheral angiopathy without gangrene: Secondary | ICD-10-CM | POA: Diagnosis present

## 2023-07-23 DIAGNOSIS — E11628 Type 2 diabetes mellitus with other skin complications: Secondary | ICD-10-CM | POA: Diagnosis not present

## 2023-07-23 DIAGNOSIS — E1169 Type 2 diabetes mellitus with other specified complication: Secondary | ICD-10-CM | POA: Diagnosis present

## 2023-07-23 DIAGNOSIS — Z8249 Family history of ischemic heart disease and other diseases of the circulatory system: Secondary | ICD-10-CM

## 2023-07-23 DIAGNOSIS — R0600 Dyspnea, unspecified: Secondary | ICD-10-CM | POA: Diagnosis not present

## 2023-07-23 DIAGNOSIS — I428 Other cardiomyopathies: Secondary | ICD-10-CM | POA: Diagnosis present

## 2023-07-23 DIAGNOSIS — E1159 Type 2 diabetes mellitus with other circulatory complications: Secondary | ICD-10-CM | POA: Diagnosis present

## 2023-07-23 DIAGNOSIS — D61818 Other pancytopenia: Secondary | ICD-10-CM | POA: Diagnosis present

## 2023-07-23 DIAGNOSIS — U071 COVID-19: Principal | ICD-10-CM | POA: Diagnosis present

## 2023-07-23 DIAGNOSIS — I130 Hypertensive heart and chronic kidney disease with heart failure and stage 1 through stage 4 chronic kidney disease, or unspecified chronic kidney disease: Secondary | ICD-10-CM | POA: Diagnosis not present

## 2023-07-23 DIAGNOSIS — Z87891 Personal history of nicotine dependence: Secondary | ICD-10-CM

## 2023-07-23 DIAGNOSIS — I152 Hypertension secondary to endocrine disorders: Secondary | ICD-10-CM | POA: Diagnosis present

## 2023-07-23 DIAGNOSIS — J029 Acute pharyngitis, unspecified: Secondary | ICD-10-CM | POA: Diagnosis not present

## 2023-07-23 DIAGNOSIS — Z79899 Other long term (current) drug therapy: Secondary | ICD-10-CM

## 2023-07-23 DIAGNOSIS — I509 Heart failure, unspecified: Secondary | ICD-10-CM | POA: Diagnosis not present

## 2023-07-23 DIAGNOSIS — L97419 Non-pressure chronic ulcer of right heel and midfoot with unspecified severity: Secondary | ICD-10-CM | POA: Diagnosis present

## 2023-07-23 DIAGNOSIS — Z7951 Long term (current) use of inhaled steroids: Secondary | ICD-10-CM

## 2023-07-23 DIAGNOSIS — J189 Pneumonia, unspecified organism: Secondary | ICD-10-CM | POA: Diagnosis not present

## 2023-07-23 DIAGNOSIS — E785 Hyperlipidemia, unspecified: Secondary | ICD-10-CM | POA: Diagnosis present

## 2023-07-23 DIAGNOSIS — Z1152 Encounter for screening for COVID-19: Secondary | ICD-10-CM | POA: Diagnosis not present

## 2023-07-23 DIAGNOSIS — I480 Paroxysmal atrial fibrillation: Secondary | ICD-10-CM | POA: Diagnosis not present

## 2023-07-23 DIAGNOSIS — J9801 Acute bronchospasm: Secondary | ICD-10-CM | POA: Diagnosis present

## 2023-07-23 DIAGNOSIS — N179 Acute kidney failure, unspecified: Secondary | ICD-10-CM | POA: Diagnosis not present

## 2023-07-23 HISTORY — DX: COVID-19: U07.1

## 2023-07-23 HISTORY — DX: Acute respiratory failure with hypoxia: J96.01

## 2023-07-23 LAB — BASIC METABOLIC PANEL
Anion gap: 11 (ref 5–15)
BUN: 17 mg/dL (ref 8–23)
CO2: 29 mmol/L (ref 22–32)
Calcium: 8.5 mg/dL — ABNORMAL LOW (ref 8.9–10.3)
Chloride: 105 mmol/L (ref 98–111)
Creatinine, Ser: 1.73 mg/dL — ABNORMAL HIGH (ref 0.61–1.24)
GFR, Estimated: 38 mL/min — ABNORMAL LOW (ref >60)
Glucose, Bld: 152 mg/dL — ABNORMAL HIGH (ref 70–99)
Potassium: 4.1 mmol/L (ref 3.5–5.1)
Sodium: 145 mmol/L (ref 135–145)

## 2023-07-23 LAB — CBC
HCT: 38.2 % — ABNORMAL LOW (ref 39.0–52.0)
Hemoglobin: 11.2 g/dL — ABNORMAL LOW (ref 13.0–17.0)
MCH: 25.3 pg — ABNORMAL LOW (ref 26.0–34.0)
MCHC: 29.3 g/dL — ABNORMAL LOW (ref 30.0–36.0)
MCV: 86.4 fL (ref 80.0–100.0)
Platelets: 105 10*3/uL — ABNORMAL LOW (ref 150–400)
RBC: 4.42 MIL/uL (ref 4.22–5.81)
RDW: 15.9 % — ABNORMAL HIGH (ref 11.5–15.5)
WBC: 2.7 10*3/uL — ABNORMAL LOW (ref 4.0–10.5)
nRBC: 0 % (ref 0.0–0.2)

## 2023-07-23 LAB — RESP PANEL BY RT-PCR (RSV, FLU A&B, COVID)  RVPGX2
Influenza A by PCR: NEGATIVE
Influenza B by PCR: NEGATIVE
Resp Syncytial Virus by PCR: NEGATIVE
SARS Coronavirus 2 by RT PCR: POSITIVE — AB

## 2023-07-23 LAB — GLUCOSE, CAPILLARY: Glucose-Capillary: 204 mg/dL — ABNORMAL HIGH (ref 70–99)

## 2023-07-23 LAB — I-STAT CG4 LACTIC ACID, ED
Lactic Acid, Venous: 2 mmol/L (ref 0.5–1.9)
Lactic Acid, Venous: 2.1 mmol/L (ref 0.5–1.9)

## 2023-07-23 LAB — CBG MONITORING, ED: Glucose-Capillary: 152 mg/dL — ABNORMAL HIGH (ref 70–99)

## 2023-07-23 LAB — BRAIN NATRIURETIC PEPTIDE: B Natriuretic Peptide: 351.9 pg/mL — ABNORMAL HIGH (ref 0.0–100.0)

## 2023-07-23 MED ORDER — BUDESONIDE 0.25 MG/2ML IN SUSP
0.2500 mg | Freq: Two times a day (BID) | RESPIRATORY_TRACT | Status: DC
  Administered 2023-07-23 – 2023-07-28 (×9): 0.25 mg via RESPIRATORY_TRACT
  Filled 2023-07-23 (×10): qty 2

## 2023-07-23 MED ORDER — SENNOSIDES-DOCUSATE SODIUM 8.6-50 MG PO TABS: 1.0000 | ORAL_TABLET | Freq: Every evening | ORAL | Status: DC | PRN

## 2023-07-23 MED ORDER — INSULIN ASPART 100 UNIT/ML IJ SOLN
0.0000 [IU] | Freq: Three times a day (TID) | INTRAMUSCULAR | Status: DC
  Administered 2023-07-24 – 2023-07-25 (×4): 3 [IU] via SUBCUTANEOUS
  Administered 2023-07-25 – 2023-07-26 (×3): 2 [IU] via SUBCUTANEOUS
  Administered 2023-07-26: 3 [IU] via SUBCUTANEOUS
  Administered 2023-07-26: 8 [IU] via SUBCUTANEOUS
  Administered 2023-07-27 (×2): 5 [IU] via SUBCUTANEOUS
  Administered 2023-07-27: 2 [IU] via SUBCUTANEOUS
  Administered 2023-07-28: 3 [IU] via SUBCUTANEOUS
  Filled 2023-07-23: qty 0.15

## 2023-07-23 MED ORDER — PREDNISONE 20 MG PO TABS
40.0000 mg | ORAL_TABLET | Freq: Every day | ORAL | Status: DC
  Administered 2023-07-24 – 2023-07-28 (×5): 40 mg via ORAL
  Filled 2023-07-23 (×5): qty 2

## 2023-07-23 MED ORDER — ARFORMOTEROL TARTRATE 15 MCG/2ML IN NEBU
15.0000 ug | INHALATION_SOLUTION | Freq: Two times a day (BID) | RESPIRATORY_TRACT | Status: DC
  Administered 2023-07-23 – 2023-07-28 (×9): 15 ug via RESPIRATORY_TRACT
  Filled 2023-07-23 (×10): qty 2

## 2023-07-23 MED ORDER — ONDANSETRON HCL 4 MG/2ML IJ SOLN: 4.0000 mg | Freq: Four times a day (QID) | INTRAMUSCULAR | Status: DC | PRN

## 2023-07-23 MED ORDER — FUROSEMIDE 10 MG/ML IJ SOLN
40.0000 mg | Freq: Once | INTRAMUSCULAR | Status: AC
  Administered 2023-07-23: 40 mg via INTRAVENOUS
  Filled 2023-07-23: qty 4

## 2023-07-23 MED ORDER — METOPROLOL SUCCINATE ER 50 MG PO TB24
75.0000 mg | ORAL_TABLET | Freq: Every day | ORAL | Status: DC
  Administered 2023-07-24 – 2023-07-28 (×5): 75 mg via ORAL
  Filled 2023-07-23 (×5): qty 1

## 2023-07-23 MED ORDER — TAMSULOSIN HCL 0.4 MG PO CAPS
0.4000 mg | ORAL_CAPSULE | Freq: Two times a day (BID) | ORAL | Status: DC
  Administered 2023-07-23 – 2023-07-28 (×10): 0.4 mg via ORAL
  Filled 2023-07-23 (×10): qty 1

## 2023-07-23 MED ORDER — ACETAMINOPHEN 650 MG RE SUPP: 650.0000 mg | Freq: Four times a day (QID) | RECTAL | Status: DC | PRN

## 2023-07-23 MED ORDER — OXCARBAZEPINE 150 MG PO TABS
75.0000 mg | ORAL_TABLET | Freq: Two times a day (BID) | ORAL | Status: DC
  Administered 2023-07-23 – 2023-07-28 (×10): 75 mg via ORAL
  Filled 2023-07-23 (×10): qty 0.5

## 2023-07-23 MED ORDER — ONDANSETRON HCL 4 MG PO TABS: 4.0000 mg | ORAL_TABLET | Freq: Four times a day (QID) | ORAL | Status: DC | PRN

## 2023-07-23 MED ORDER — ACETAMINOPHEN 325 MG PO TABS
650.0000 mg | ORAL_TABLET | ORAL | Status: AC
  Administered 2023-07-23: 650 mg via ORAL
  Filled 2023-07-23: qty 2

## 2023-07-23 MED ORDER — PANTOPRAZOLE SODIUM 40 MG PO TBEC
40.0000 mg | DELAYED_RELEASE_TABLET | Freq: Every day | ORAL | Status: DC
  Administered 2023-07-24 – 2023-07-28 (×5): 40 mg via ORAL
  Filled 2023-07-23 (×5): qty 1

## 2023-07-23 MED ORDER — INSULIN ASPART 100 UNIT/ML IJ SOLN
0.0000 [IU] | Freq: Every day | INTRAMUSCULAR | Status: DC
  Administered 2023-07-23: 2 [IU] via SUBCUTANEOUS
  Administered 2023-07-26 – 2023-07-27 (×2): 4 [IU] via SUBCUTANEOUS
  Filled 2023-07-23: qty 0.05

## 2023-07-23 MED ORDER — FINASTERIDE 5 MG PO TABS
5.0000 mg | ORAL_TABLET | Freq: Every day | ORAL | Status: DC
  Administered 2023-07-24 – 2023-07-28 (×5): 5 mg via ORAL
  Filled 2023-07-23 (×5): qty 1

## 2023-07-23 MED ORDER — GUAIFENESIN ER 600 MG PO TB12
600.0000 mg | ORAL_TABLET | Freq: Two times a day (BID) | ORAL | Status: DC
  Administered 2023-07-23 – 2023-07-28 (×10): 600 mg via ORAL
  Filled 2023-07-23 (×10): qty 1

## 2023-07-23 MED ORDER — SODIUM CHLORIDE 0.9% FLUSH
3.0000 mL | Freq: Two times a day (BID) | INTRAVENOUS | Status: DC
  Administered 2023-07-23 – 2023-07-28 (×10): 3 mL via INTRAVENOUS

## 2023-07-23 MED ORDER — RIVAROXABAN 15 MG PO TABS
15.0000 mg | ORAL_TABLET | Freq: Every day | ORAL | Status: DC
  Administered 2023-07-24 – 2023-07-27 (×4): 15 mg via ORAL
  Filled 2023-07-23 (×4): qty 1

## 2023-07-23 MED ORDER — GABAPENTIN 300 MG PO CAPS
300.0000 mg | ORAL_CAPSULE | Freq: Two times a day (BID) | ORAL | Status: DC
  Administered 2023-07-23 – 2023-07-28 (×10): 300 mg via ORAL
  Filled 2023-07-23 (×10): qty 1

## 2023-07-23 MED ORDER — ALBUTEROL SULFATE (2.5 MG/3ML) 0.083% IN NEBU
2.5000 mg | INHALATION_SOLUTION | Freq: Once | RESPIRATORY_TRACT | Status: AC
  Administered 2023-07-23: 2.5 mg via RESPIRATORY_TRACT
  Filled 2023-07-23: qty 3

## 2023-07-23 MED ORDER — ATORVASTATIN CALCIUM 40 MG PO TABS
40.0000 mg | ORAL_TABLET | Freq: Every day | ORAL | Status: DC
  Administered 2023-07-24 – 2023-07-28 (×5): 40 mg via ORAL
  Filled 2023-07-23 (×5): qty 1

## 2023-07-23 MED ORDER — ENSURE ENLIVE PO LIQD
237.0000 mL | Freq: Two times a day (BID) | ORAL | Status: DC
  Administered 2023-07-24 – 2023-07-28 (×9): 237 mL via ORAL

## 2023-07-23 MED ORDER — ACETAMINOPHEN 325 MG PO TABS: 650.0000 mg | ORAL_TABLET | Freq: Four times a day (QID) | ORAL | Status: DC | PRN

## 2023-07-23 MED ORDER — IPRATROPIUM-ALBUTEROL 0.5-2.5 (3) MG/3ML IN SOLN: 3.0000 mL | Freq: Four times a day (QID) | RESPIRATORY_TRACT | Status: DC | PRN

## 2023-07-23 NOTE — ED Notes (Signed)
 ED TO INPATIENT HANDOFF REPORT  ED Nurse Name and Phone #: Majel Homer, RN   S Name/Age/Gender Erik [name] 83 y.o. male Room/Bed: WA18/WA18  Code Status   Code Status: Full Code  Home/SNF/Other Home Patient oriented to: self, place, time, and situation Is this baseline? Yes   Triage Complete: Triage complete  Chief Complaint Acute respiratory failure with hypoxia (HCC) [J96.01]  Triage Note BIB EMS from pt PCP office for sob. Pt was diagnosed with bilateral pneumonia today and covid. Pt was 85% on RA, has had 2 duonebs and solumedrol and came up to 92%   Allergies Allergies  Allergen Reactions   Claritin [Loratadine] Other (See Comments)    Urinary retention   Tradjenta [Linagliptin] Other (See Comments)    High blood sugar   Imipramine Other (See Comments)    Other reaction(s): hallucination/dizziness    Level of Care/Admitting Diagnosis ED Disposition     ED Disposition  Admit   Condition  --   Comment  Hospital Area: [facility] [[id number]]  Level of Care: Telemetry [5]  Admit to tele based on following criteria: Acute CHF  May place patient in observation at [facility] or [facility] if equivalent level of care is available:: No  Covid Evaluation: Confirmed COVID Positive  Diagnosis: Acute respiratory failure with hypoxia [facility]) [[id number]]  Admitting Physician: Charlsie Quest [[id number]]  Attending Physician: Charlsie Quest [[id number]]          B Medical/Surgery History Past Medical History:  Diagnosis Date   Chronic combined systolic and diastolic CHF (congestive heart failure) (HCC)    CKD (chronic kidney disease), stage III (HCC)    Coronary artery disease    a. Nonobstructive by cath 2004. b. Nuc 09/2013 - no ischemia, fixed defect suggestive of possible prior infarction, felt low risk, EF 48%.    Diabetes mellitus    GERD (gastroesophageal reflux disease)    Hyperlipidemia    Hypertension    NICM (nonischemic  cardiomyopathy) (HCC)    a. EF 35-40% in 2009, 55% in 2010. b. 09/2013: 35-40% by echo and 48% by nuc. c. EF 50-55% by echo 12/2014.   PAD (peripheral artery disease) (HCC)    a. mild by noninvasive testing.   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    Peripheral arterial disease (HCC)    Premature ventricular contractions    Thrombocytopenia (HCC)    Trigeminal neuralgia    Type 2 diabetes mellitus with chronic kidney disease, without long-term current use of insulin (HCC) 12/02/2012   Past Surgical History:  Procedure Laterality Date   CARDIAC CATHETERIZATION  10/25/00, 12/29/02   CHOLECYSTECTOMY N/A 01/25/2015   Procedure: LAPAROSCOPIC CHOLECYSTECTOMY ;  Surgeon: Abigail Miyamoto, MD;  Location: [facility] OR;  Service: General;  Laterality: N/A;   SHOULDER SURGERY Left 2011   Shoulder surgery, rod inserted     A IV Location/Drains/Wounds Patient Lines/Drains/Airways Status     Active Line/Drains/Airways     Name Placement date Placement time Site Days   Peripheral IV 07/23/23 18 G Anterior;Left Forearm 07/23/23  1544  Forearm  less than 1   Incision - 4 Ports Abdomen 1: Umbilicus 2: Mid;Upper 3: Right;Lateral 4: Right;Medial 01/25/15  --  -- 3101   Wound 12/02/12 Diabetic ulcer Foot Anterior;Left 12/02/12  1245  Foot  3885            Intake/Output Last 24 hours  Intake/Output Summary (Last 24  hours) at 07/23/2023 2016 Last data filed at 07/23/2023 1951 Gross per 24 hour  Intake --  Output 300 ml  Net -300 ml    Labs/Imaging Results for orders placed or performed during the hospital encounter of 07/23/23 (from the past 48 hours)  Basic metabolic panel     Status: Abnormal   Collection Time: 07/23/23  4:01 PM  Result Value Ref Range   Sodium 145 135 - 145 mmol/L   Potassium 4.1 3.5 - 5.1 mmol/L   Chloride 105 98 - 111 mmol/L   CO2 29 22 - 32 mmol/L   Glucose, Bld 152 (H) 70 - 99 mg/dL    Comment: Glucose reference range applies  only to samples taken after fasting for at least 8 hours.   BUN 17 8 - 23 mg/dL   Creatinine, Ser 4.09 (H) 0.61 - 1.24 mg/dL   Calcium 8.5 (L) 8.9 - 10.3 mg/dL   GFR, Estimated 38 (L) >60 mL/min    Comment: (NOTE) Calculated using the CKD-EPI Creatinine Equation (2021)    Anion gap 11 5 - 15    Comment: Performed at [facility], [address] [place], [place] [address]  Brain natriuretic peptide     Status: Abnormal   Collection Time: 07/23/23  4:01 PM  Result Value Ref Range   B Natriuretic Peptide 351.9 (H) 0.0 - 100.0 pg/mL    Comment: Performed at [facility], [address], [place], [place] [address]  CBC     Status: Abnormal   Collection Time: 07/23/23  4:01 PM  Result Value Ref Range   WBC 2.7 (L) 4.0 - 10.5 K/uL   RBC 4.42 4.22 - 5.81 MIL/uL   Hemoglobin 11.2 (L) 13.0 - 17.0 g/dL   HCT 56.2 (L) 13.0 - 86.5 %   MCV 86.4 80.0 - 100.0 fL   MCH 25.3 (L) 26.0 - 34.0 pg   MCHC 29.3 (L) 30.0 - 36.0 g/dL   RDW 78.4 (H) 69.6 - 29.5 %   Platelets 105 (L) 150 - 400 K/uL   nRBC 0.0 0.0 - 0.2 %    Comment: Performed at [facility], [address] [place], [place] [address]  Resp panel by RT-PCR (RSV, Flu A&B, Covid) Anterior Nasal Swab     Status: Abnormal   Collection Time: 07/23/23  4:04 PM   Specimen: Anterior Nasal Swab  Result Value Ref Range   SARS Coronavirus 2 by RT PCR POSITIVE (A) NEGATIVE    Comment: (NOTE) SARS-CoV-2 target nucleic acids are DETECTED.  The SARS-CoV-2 RNA is generally detectable in upper respiratory specimens during the acute phase of infection. Positive results are indicative of the presence of the identified virus, but do not rule out bacterial infection or co-infection with other pathogens not detected by the test. Clinical correlation with patient history and other diagnostic information is necessary to determine patient infection status. The expected result is Negative.  Fact Sheet  for Patients: BloggerCourse.com  Fact Sheet for Healthcare Providers: SeriousBroker.it  This test is not yet approved or cleared by the [place] FDA and  has been authorized for detection and/or diagnosis of SARS-CoV-2 by FDA under an Emergency Use Authorization (EUA).  This EUA will remain in effect (meaning this test can be used) for the duration of  the COVID-19 declaration under Section 564(b)(1) of the A ct, 21 U.S.C. section 360bbb-3(b)(1), unless the authorization is terminated or revoked sooner.     Influenza A by PCR  NEGATIVE NEGATIVE   Influenza B by PCR NEGATIVE NEGATIVE    Comment: (NOTE) The Xpert Xpress SARS-CoV-2/FLU/RSV plus assay is intended as an aid in the diagnosis of influenza from Nasopharyngeal swab specimens and should not be used as a sole basis for treatment. Nasal washings and aspirates are unacceptable for Xpert Xpress SARS-CoV-2/FLU/RSV testing.  Fact Sheet for Patients: BloggerCourse.com  Fact Sheet for Healthcare Providers: SeriousBroker.it  This test is not yet approved or cleared by the [place] FDA and has been authorized for detection and/or diagnosis of SARS-CoV-2 by FDA under an Emergency Use Authorization (EUA). This EUA will remain in effect (meaning this test can be used) for the duration of the COVID-19 declaration under Section 564(b)(1) of the Act, 21 U.S.C. section 360bbb-3(b)(1), unless the authorization is terminated or revoked.     Resp Syncytial Virus by PCR NEGATIVE NEGATIVE    Comment: (NOTE) Fact Sheet for Patients: BloggerCourse.com  Fact Sheet for Healthcare Providers: SeriousBroker.it  This test is not yet approved or cleared by the [place] FDA and has been authorized for detection and/or diagnosis of SARS-CoV-2 by FDA under an Emergency Use Authorization  (EUA). This EUA will remain in effect (meaning this test can be used) for the duration of the COVID-19 declaration under Section 564(b)(1) of the Act, 21 U.S.C. section 360bbb-3(b)(1), unless the authorization is terminated or revoked.  Performed at [facility], [address] [place], [place] [address]   I-Stat CG4 Lactic Acid     Status: Abnormal   Collection Time: 07/23/23  4:10 PM  Result Value Ref Range   Lactic Acid, Venous 2.0 (HH) 0.5 - 1.9 mmol/L   Comment NOTIFIED PHYSICIAN   CBG monitoring, ED     Status: Abnormal   Collection Time: 07/23/23  5:02 PM  Result Value Ref Range   Glucose-Capillary 152 (H) 70 - 99 mg/dL    Comment: Glucose reference range applies only to samples taken after fasting for at least 8 hours.  I-Stat CG4 Lactic Acid     Status: Abnormal   Collection Time: 07/23/23  6:44 PM  Result Value Ref Range   Lactic Acid, Venous 2.1 (HH) 0.5 - 1.9 mmol/L   Comment NOTIFIED PHYSICIAN    DG Chest 2 View Result Date: 07/23/2023 CLINICAL DATA:  Shortness of breath. EXAM: CHEST - 2 VIEW COMPARISON:  Oct 03, 2020. FINDINGS: The heart size and mediastinal contours are within normal limits. Both lungs are clear. Status post left shoulder arthroplasty. IMPRESSION: No active cardiopulmonary disease. Electronically Signed   By: Lupita Raider M.D.   On: 07/23/2023 17:33    Pending Labs Unresulted Labs (From admission, onward)     Start     Ordered   07/24/23 0500  Basic metabolic panel  Tomorrow morning,   R        07/23/23 1907   07/24/23 0500  CBC with Differential/Platelet  Tomorrow morning,   R        07/23/23 1907            Vitals/Pain Today's Vitals   07/23/23 1547 07/23/23 1830 07/23/23 1900 07/23/23 1915  BP: (!) 123/99 (!) 161/65 105/60 132/65  Pulse: (!) 102 (!) 107 (!) 107 (!) 106  Resp: 20 (!) 32 (!) 27 (!) 25  Temp: 99.4 F (37.4 C)   98.7 F (37.1 C)  TempSrc: Oral   Oral  SpO2: 95% 94% (!) 84% 93%  Weight: 81.2  kg     Height:  5\' 11"  (1.803 m)     PainSc: 0-No pain       Isolation Precautions No active isolations  Medications Medications  sodium chloride flush (NS) 0.9 % injection 3 mL (has no administration in time range)  acetaminophen (TYLENOL) tablet 650 mg (has no administration in time range)    Or  acetaminophen (TYLENOL) suppository 650 mg (has no administration in time range)  ondansetron (ZOFRAN) tablet 4 mg (has no administration in time range)    Or  ondansetron (ZOFRAN) injection 4 mg (has no administration in time range)  senna-docusate (Senokot-S) tablet 1 tablet (has no administration in time range)  guaiFENesin (MUCINEX) 12 hr tablet 600 mg (has no administration in time range)  arformoterol (BROVANA) nebulizer solution 15 mcg (15 mcg Nebulization Given 07/23/23 1935)  budesonide (PULMICORT) nebulizer solution 0.25 mg (0.25 mg Nebulization Given 07/23/23 1950)  predniSONE (DELTASONE) tablet 40 mg (has no administration in time range)  ipratropium-albuterol (DUONEB) 0.5-2.5 (3) MG/3ML nebulizer solution 3 mL (has no administration in time range)  insulin aspart (novoLOG) injection 0-15 Units (has no administration in time range)  insulin aspart (novoLOG) injection 0-5 Units (has no administration in time range)  acetaminophen (TYLENOL) tablet 650 mg (650 mg Oral Given 07/23/23 1802)  albuterol (PROVENTIL) (2.5 MG/3ML) 0.083% nebulizer solution 2.5 mg (2.5 mg Nebulization Given 07/23/23 1802)  furosemide (LASIX) injection 40 mg (40 mg Intravenous Given 07/23/23 1824)    Mobility walks with person assist     Focused Assessments See Chart   R Recommendations: See Admitting Provider Note  Report given to:   Additional Notes: See Chart

## 2023-07-23 NOTE — ED Notes (Signed)
Wife given update.

## 2023-07-23 NOTE — ED Provider Triage Note (Signed)
 Emergency Medicine Provider Triage Evaluation Note  Erik [name] , a 83 y.o. male  was evaluated in triage.  Pt complains of COVID-positive status, shortness of breath.  Reports he has been feeling short of breath and febrile for the last 3 days.  States he went to PCP today where he was diagnosed with COVID-19 as well as bilateral pneumonia.  Patient had room air saturation 85% at doctor's office.  Received 2 DuoNebs and Solu-Medrol with EMS.  Patient states he does become dizzy and lightheaded on ambulating.  Denies chest pain.  Endorsing leg swelling, states he takes fluid pills.  Denies nausea, vomiting, fevers or diarrhea.  Review of Systems  Positive:  Negative:   Physical Exam  BP (!) 123/99   Pulse (!) 102   Temp 99.4 F (37.4 C) (Oral)   Resp 20   Ht 5\' 11"  (1.803 m)   Wt 81.2 kg   SpO2 95%   BMI 24.97 kg/m  Gen:   Awake, no distress   Resp:  Normal effort  MSK:   Moves extremities without difficulty  Other:    Medical Decision Making  Medically screening exam initiated at 3:56 PM.  Appropriate orders placed.  Erik [name] was informed that the remainder of the evaluation will be completed by another provider, this initial triage assessment does not replace that evaluation, and the importance of remaining in the ED until their evaluation is complete.     Al Decant, PA-C 07/23/23 1556

## 2023-07-23 NOTE — H&P (Signed)
 History and Physical    Erik [name] EAV:[id number] DOB: [birth year] DOA: 07/23/2023  PCP: Gaspar Garbe, MD  Patient coming from: Home  I have personally briefly reviewed patient's old medical records in [facility]  Chief Complaint: Shortness of breath, cough  HPI: Erik [name] is a 83 y.o. male with medical history significant for chronic HFmrEF, PAF on Xarelto, CKD stage IIIb, T2DM, HTN, chronic thrombocytopenia, HLD, BPH who presents to the ED for evaluation of shortness of breath.  Patient reports nonproductive cough and exertional dyspnea for the last 3 days.  He was seen by his PCP in office earlier today and noted to be hypoxic to 85% while on room air.  Both he and his wife tested positive for COVID-19.  He was given Solu-Medrol and DuoNeb treatment in the office with some improvement and subsequently sent to the ED for further evaluation and management.  Patient states that dyspnea improves with rest.  He reports chronic bilateral lower extremity edema which is somewhat increased from baseline.  He has been following with wound care for management of a chronic right plantar heel ulcer which has been healing well.  ED Course  Labs/Imaging on admission: I have personally reviewed following labs and imaging studies.  Initial vitals showed BP 123/99, pulse 102, RR 20, temp 99.4 F, SpO2 95% on room air.  Patient desaturated to 85% while on room air and placed on 2 L O2 via [place] with improvement to >94%.  Labs show WBC 2.7, hemoglobin 11.2, platelets 105,000, sodium 145, potassium 4.1, bicarb 29, BUN 17, creatinine 1.73, serum glucose 152, BNP 351.9.  SARS-CoV-2 PCR is positive.  Influenza and RSV negative.  2 view chest x-ray negative for focal consolidation, edema, effusion.  Patient was given IV Lasix 40 mg and albuterol nebulizer.  The hospitalist service was consulted to admit for further evaluation and management.  Review of Systems: All systems reviewed and are  negative except as documented in history of present illness above.   Past Medical History:  Diagnosis Date   Chronic combined systolic and diastolic CHF (congestive heart failure) (HCC)    CKD (chronic kidney disease), stage III (HCC)    Coronary artery disease    a. Nonobstructive by cath 2004. b. Nuc 09/2013 - no ischemia, fixed defect suggestive of possible prior infarction, felt low risk, EF 48%.    Diabetes mellitus    GERD (gastroesophageal reflux disease)    Hyperlipidemia    Hypertension    NICM (nonischemic cardiomyopathy) (HCC)    a. EF 35-40% in 2009, 55% in 2010. b. 09/2013: 35-40% by echo and 48% by nuc. c. EF 50-55% by echo 12/2014.   PAD (peripheral artery disease) (HCC)    a. mild by noninvasive testing.   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    Peripheral arterial disease (HCC)    Premature ventricular contractions    Thrombocytopenia (HCC)    Trigeminal neuralgia    Type 2 diabetes mellitus with chronic kidney disease, without long-term current use of insulin (HCC) 12/02/2012    Past Surgical History:  Procedure Laterality Date   CARDIAC CATHETERIZATION  10/25/00, 12/29/02   CHOLECYSTECTOMY N/A 01/25/2015   Procedure: LAPAROSCOPIC CHOLECYSTECTOMY ;  Surgeon: Abigail Miyamoto, MD;  Location: [facility] OR;  Service: General;  Laterality: N/A;   SHOULDER SURGERY Left 2011   Shoulder surgery, rod inserted    Social History:  reports that he quit smoking about  38 years ago. His smoking use included cigarettes. He started smoking about 48 years ago. He has a 5 pack-year smoking history. He has never used smokeless tobacco. He reports that he does not drink alcohol and does not use drugs.  Allergies  Allergen Reactions   Claritin [Loratadine] Other (See Comments)    Urinary retention   Tradjenta [Linagliptin] Other (See Comments)    High blood sugar   Imipramine Other (See Comments)    Other reaction(s): hallucination/dizziness     Family History  Problem Relation Age of Onset   Heart disease Mother    Diabetes Mother    Hypertension Mother    Heart disease Father    Heart disease Brother    Diabetes Brother    Hypertension Brother    Heart attack Neg Hx    Stroke Neg Hx      Prior to Admission medications   Medication Sig Start Date End Date Taking? Authorizing Provider  acetaminophen (TYLENOL) 325 MG tablet Take 2 tablets (650 mg total) by mouth every 6 (six) hours as needed for mild pain (or Fever >/= 101). 01/29/15   Elgergawy, Leana Roe, MD  atorvastatin (LIPITOR) 40 MG tablet Take 1 tablet by mouth daily.    [provider]  Baclofen 5 MG TABS Take 1 tablet (5 mg total) by mouth at bedtime as needed. 12/07/22   Raspet, Noberto Retort, PA-C  BD PEN NEEDLE NANO U/F 32G X 4 MM MISC USE 1 NEEDLE WITH INSULIN INJECTION ONCE DAILY 03/04/18   [provider]  Blood Glucose Calibration (TRUE METRIX LEVEL 1) Low SOLN USE TO CALIBRATE GLUCOMETER ONCE MONTHY OR WITH EACH NEW VIAL OF STRIPS, WHICHEVER COMES FIRST 10/13/17   [provider]  Blood Glucose Monitoring Suppl (TRUE METRIX AIR GLUCOSE METER) w/Device KIT use to check blood sugar three times daily prior to meals. E11.29 10/09/17   [provider]  Coenzyme Q10 (COQ10) 200 MG CAPS Take 100 mg by mouth daily.     [provider]  Continuous Blood Gluc Receiver (FREESTYLE LIBRE 2 READER) DEVI See admin instructions. 12/26/21   [provider]  Continuous Blood Gluc Sensor (FREESTYLE LIBRE 2 SENSOR) MISC Inject into the skin every 14 (fourteen) days. 03/19/22   [provider]  dapagliflozin propanediol (FARXIGA) 10 MG TABS tablet Take 1 tablet (10 mg total) by mouth daily before breakfast. 04/10/22   Meriam Sprague, MD  doxycycline (VIBRA-TABS) 100 MG tablet Take 1 tablet (100 mg total) by mouth 2 (two) times daily. 12/27/22   Vivi Barrack, DPM  finasteride (PROSCAR) 5 MG tablet Take 5 mg by mouth once  daily 03/06/17   [provider]  furosemide (LASIX) 40 MG tablet Take 1 tablet (40 mg total) by mouth daily 04/23/22   Sharlene Dory, PA-C  gabapentin (NEURONTIN) 300 MG capsule Take 1 capsule (300 mg total) by mouth 2 (two) times daily. 02/04/23   Wille Aubuchon, Roxana Hires K, DO  glucose blood (TRUE METRIX BLOOD GLUCOSE TEST) test strip use to check blood sugar three times daily prior to meals. E11.29 10/09/17   [provider]  ipratropium (ATROVENT) 0.03 % nasal spray  06/05/20   [provider]  lidocaine (LIDODERM) 5 % Place 1 patch onto the skin daily. Remove & Discard patch within 12 hours or as directed by MD 12/07/22   Raspet, Noberto Retort, PA-C  metoprolol succinate (TOPROL XL) 50 MG 24 hr tablet Take 1 tablet (50 mg total) by mouth  daily. Take with or immediately following a meal. 04/08/23   Swinyer, Zachary George, NP  moxifloxacin (VIGAMOX) 0.5 % ophthalmic solution Place 1 drop into the left eye 4 (four) times daily. 11/11/19   [provider]  Multiple Vitamin (MULTIVITAMIN) capsule Take 1 capsule by mouth daily.    [provider]  OXcarbazepine (TRILEPTAL) 150 MG tablet Take 0.5 tablets (75 mg total) by mouth 2 (two) times daily. 04/02/23   Suvan Stcyr, Roxana Hires K, DO  pantoprazole (PROTONIX) 20 MG tablet Take 20 mg by mouth daily. 12/25/21   [provider]  potassium chloride SA (KLOR-CON M) 20 MEQ tablet Take 1 tablet (20 mEq total) by mouth daily for 5 days only.  Take in concurrence with 5 day increased lasix. 04/11/22   Meriam Sprague, MD  PROLENSA 0.07 % SOLN  11/11/19   [provider]  Rivaroxaban (XARELTO) 15 MG TABS tablet Take 1 tablet (15 mg total) by mouth daily with supper. 04/08/23   Swinyer, Zachary George, NP  selenium 200 MCG TABS tablet Take 200 mcg by mouth daily.    [provider]  silver sulfADIAZINE (SILVADENE) 1 % cream Apply 1 Application topically daily. 11/12/22   Vivi Barrack, DPM  tamsulosin (FLOMAX) 0.4 MG CAPS  capsule Take 0.4 mg by mouth at bedtime.    [provider]  TOUJEO SOLOSTAR 300 UNIT/ML SOPN Inject 36 Units as directed daily.  03/12/16   [provider]  Trospium Chloride 60 MG CP24  02/02/18   [provider]  TRUEplus Lancets 33G MISC use to check blood sugar three times daily prior to meals. E11.29 10/09/17   [provider]    Physical Exam: Vitals:   07/23/23 1915 07/23/23 2000 07/23/23 2017 07/23/23 2108  BP: 132/65 (!) 143/64 (!) 142/54 (!) 159/64  Pulse: (!) 106 (!) 104 97 (!) 109  Resp: (!) 25 (!) 24 20 18   Temp: 98.7 F (37.1 C)  98 F (36.7 C) 98.3 F (36.8 C)  TempSrc: Oral  Oral   SpO2: 93% 100% 94% 96%  Weight:      Height:       Constitutional: Resting in bed, NAD, calm, comfortable Eyes: EOMI, lids and conjunctivae normal ENMT: Mucous membranes are moist. Posterior pharynx clear of any exudate or lesions.poor dentition.  Neck: normal, supple, no masses. Respiratory: Coarse expiratory wheezing throughout the lung fields. Normal respiratory effort while on 2 L O2 via [place]. No accessory muscle use.  Cardiovascular: Regular rate and rhythm, no murmurs / rubs / gallops.  +1 bilateral lower extremity edema. 2+ pedal pulses. Abdomen: no tenderness, no masses palpated. Musculoskeletal: no clubbing / cyanosis. No joint deformity upper and lower extremities. Good ROM, no contractures. Normal muscle tone.  Skin: Well-healing right plantar heel ulcer without open wound, erythema, or discharge.. No induration Neurologic: Sensation intact. Strength 5/5 in all 4.  Psychiatric: Normal judgment and insight. Alert and oriented x 3. Normal mood.   EKG: Personally reviewed. Sinus rhythm, rate 99, no acute ischemic changes.  Assessment/Plan Principal Problem:   Acute respiratory failure with hypoxia (HCC) Active Problems:   COVID-19 virus infection   Chronic heart failure with mildly reduced ejection fraction (HFmrEF, 41-49%) (HCC)   Type 2  diabetes mellitus (HCC)   Chronic kidney disease, stage 3b (HCC)   Paroxysmal atrial fibrillation (HCC)   Hypertension associated with diabetes (HCC)   Hyperlipidemia associated with type 2 diabetes mellitus (HCC)   Benign prostatic hyperplasia with lower urinary tract  symptoms   Erik [name] is a 83 y.o. male with medical history significant for chronic HFmrEF, PAF on Xarelto, CKD stage IIIb, T2DM, HTN, chronic thrombocytopenia, HLD, BPH who is admitted with acute hypoxic respiratory failure due to COVID-19 bronchospasm.  Assessment and Plan: Acute respiratory failure with hypoxia due to COVID-19 bronchospasm: Coarse wheezing throughout the lung fields.  CXR without evidence of pneumonia or pulmonary edema.  SpO2 as low as 85% while on room air, improved on 2 L O2 via [place].  Received Solu-Medrol PTA. -Brovana/Pulmicort twice daily, DuoNebs as needed -Prednisone 40 mg daily -Continue supplemental oxygen and wean as able  Chronic HFmrEF: Last EF 45-50%.  He has some increased lower extremity edema from baseline.  BNP 351.  CXR without evidence of pulmonary edema or effusion. -S/p IV Lasix 40 mg given in the ED -Switch to oral Lasix 40 mg daily tomorrow -Continue Toprol-XL 75 mg daily -Monitor I/O's  Paroxysmal atrial fibrillation: Sinus rhythm on admission.  Continue Toprol-XL 75 mg daily and Xarelto 15 mg daily.  CKD stage IIIb: Renal function stable.  Pancytopenia: Patient with chronic thrombocytopenia and anemia.  Leukopenia is new, likely secondary to viral infection.  Continue to monitor.  Type 2 diabetes: Placed on SSI.  Hypertension: Continue Toprol-XL.  Hyperlipidemia: Continue atorvastatin.  BPH: Continue Flomax and Proscar.  Diabetic peripheral neuropathy/history of trigeminal neuralgia: Continue gabapentin and Trileptal.   DVT prophylaxis:  Rivaroxaban (XARELTO) tablet 15 mg   Code Status: Full code, confirmed with patient on admission Family  Communication: Discussed with patient, he has discussed with family Disposition Plan: From home and likely discharge to home pending clinical progress Consults called: None Severity of Illness: The appropriate patient status for this patient is OBSERVATION. Observation status is judged to be reasonable and necessary in order to provide the required intensity of service to ensure the patient's safety. The patient's presenting symptoms, physical exam findings, and initial radiographic and laboratory data in the context of their medical condition is felt to place them at decreased risk for further clinical deterioration. Furthermore, it is anticipated that the patient will be medically stable for discharge from the hospital within 2 midnights of admission.   Darreld Mclean MD [facility]  If 7PM-7AM, please contact night-coverage [email or web address]  07/23/2023, 9:38 PM

## 2023-07-23 NOTE — ED Triage Notes (Signed)
 BIB EMS from pt PCP office for sob. Pt was diagnosed with bilateral pneumonia today and covid. Pt was 85% on RA, has had 2 duonebs and solumedrol and came up to 92%

## 2023-07-23 NOTE — Hospital Course (Signed)
 Erik [name] is a 83 y.o. male with medical history significant for chronic HFmrEF, PAF on Xarelto, CKD stage IIIb, T2DM, HTN, chronic thrombocytopenia, HLD, BPH who is admitted with acute hypoxic respiratory failure due to COVID-19 bronchospasm.

## 2023-07-23 NOTE — ED Provider Notes (Signed)
 Erik [name] EMERGENCY DEPARTMENT AT [facility] Provider Note   CSN: [id number] Arrival date & time: 07/23/23  1538     History  Chief Complaint  Patient presents with   Shortness of Breath    Erik [name] is a 83 y.o. male.  Patient is an 83 year old male with a past medical history of A-fib on Xarelto, hypertension, diabetes, CHF presenting to the emergency department with shortness of breath and cough.  The patient states for the last 3 to 4 days he has had an increased cough with associated shortness of breath on exertion.  He states he has had associated congestion and sore throat.  He states he is had a decreased appetite but denies any nausea or vomiting.  He states he was seen by his primary care doctor today who found that he was hypoxic on ambulation and that he had tested positive for COVID-19, he had a chest x-ray that was concerning for possible pneumonia and was recommended to come to the ED.  The history is provided by the patient and medical records.  Shortness of Breath      Home Medications Prior to Admission medications   Medication Sig Start Date End Date Taking? Authorizing Provider  acetaminophen (TYLENOL) 325 MG tablet Take 2 tablets (650 mg total) by mouth every 6 (six) hours as needed for mild pain (or Fever >/= 101). 01/29/15   Elgergawy, Leana Roe, MD  atorvastatin (LIPITOR) 40 MG tablet Take 1 tablet by mouth daily.    [provider]  Baclofen 5 MG TABS Take 1 tablet (5 mg total) by mouth at bedtime as needed. 12/07/22   Raspet, Noberto Retort, PA-C  BD PEN NEEDLE NANO U/F 32G X 4 MM MISC USE 1 NEEDLE WITH INSULIN INJECTION ONCE DAILY 03/04/18   [provider]  Blood Glucose Calibration (TRUE METRIX LEVEL 1) Low SOLN USE TO CALIBRATE GLUCOMETER ONCE MONTHY OR WITH EACH NEW VIAL OF STRIPS, WHICHEVER COMES FIRST 10/13/17   [provider]  Blood Glucose Monitoring Suppl (TRUE METRIX AIR GLUCOSE METER) w/Device KIT use to check  blood sugar three times daily prior to meals. E11.29 10/09/17   [provider]  Coenzyme Q10 (COQ10) 200 MG CAPS Take 100 mg by mouth daily.     [provider]  Continuous Blood Gluc Receiver (FREESTYLE LIBRE 2 READER) DEVI See admin instructions. 12/26/21   [provider]  Continuous Blood Gluc Sensor (FREESTYLE LIBRE 2 SENSOR) MISC Inject into the skin every 14 (fourteen) days. 03/19/22   [provider]  dapagliflozin propanediol (FARXIGA) 10 MG TABS tablet Take 1 tablet (10 mg total) by mouth daily before breakfast. 04/10/22   Meriam Sprague, MD  doxycycline (VIBRA-TABS) 100 MG tablet Take 1 tablet (100 mg total) by mouth 2 (two) times daily. 12/27/22   Vivi Barrack, DPM  finasteride (PROSCAR) 5 MG tablet Take 5 mg by mouth once daily 03/06/17   [provider]  furosemide (LASIX) 40 MG tablet Take 1 tablet (40 mg total) by mouth daily 04/23/22   Sharlene Dory, PA-C  gabapentin (NEURONTIN) 300 MG capsule Take 1 capsule (300 mg total) by mouth 2 (two) times daily. 02/04/23   Patel, Roxana Hires K, DO  glucose blood (TRUE METRIX BLOOD GLUCOSE TEST) test strip use to check blood sugar three times daily prior to meals. E11.29 10/09/17   [provider]  ipratropium (ATROVENT) 0.03 % nasal spray  06/05/20   [provider]  lidocaine (  LIDODERM) 5 % Place 1 patch onto the skin daily. Remove & Discard patch within 12 hours or as directed by MD 12/07/22   Raspet, Denny Peon K, PA-C  metoprolol succinate (TOPROL XL) 50 MG 24 hr tablet Take 1 tablet (50 mg total) by mouth daily. Take with or immediately following a meal. 04/08/23   Swinyer, Zachary George, NP  moxifloxacin (VIGAMOX) 0.5 % ophthalmic solution Place 1 drop into the left eye 4 (four) times daily. 11/11/19   [provider]  Multiple Vitamin (MULTIVITAMIN) capsule Take 1 capsule by mouth daily.    [provider]  OXcarbazepine (TRILEPTAL) 150 MG tablet Take 0.5 tablets (75  mg total) by mouth 2 (two) times daily. 04/02/23   Patel, Roxana Hires K, DO  pantoprazole (PROTONIX) 20 MG tablet Take 20 mg by mouth daily. 12/25/21   [provider]  potassium chloride SA (KLOR-CON M) 20 MEQ tablet Take 1 tablet (20 mEq total) by mouth daily for 5 days only.  Take in concurrence with 5 day increased lasix. 04/11/22   Meriam Sprague, MD  PROLENSA 0.07 % SOLN  11/11/19   [provider]  Rivaroxaban (XARELTO) 15 MG TABS tablet Take 1 tablet (15 mg total) by mouth daily with supper. 04/08/23   Swinyer, Zachary George, NP  selenium 200 MCG TABS tablet Take 200 mcg by mouth daily.    [provider]  silver sulfADIAZINE (SILVADENE) 1 % cream Apply 1 Application topically daily. 11/12/22   Vivi Barrack, DPM  tamsulosin (FLOMAX) 0.4 MG CAPS capsule Take 0.4 mg by mouth at bedtime.    [provider]  TOUJEO SOLOSTAR 300 UNIT/ML SOPN Inject 36 Units as directed daily.  03/12/16   [provider]  Trospium Chloride 60 MG CP24  02/02/18   [provider]  TRUEplus Lancets 33G MISC use to check blood sugar three times daily prior to meals. E11.29 10/09/17   [provider]      Allergies    Claritin [loratadine], Tradjenta [linagliptin], and Imipramine    Review of Systems   Review of Systems  Respiratory:  Positive for shortness of breath.     Physical Exam Updated Vital Signs BP (!) 123/99   Pulse (!) 102   Temp 99.4 F (37.4 C) (Oral)   Resp 20   Ht 5\' 11"  (1.803 m)   Wt 81.2 kg   SpO2 95%   BMI 24.97 kg/m  Physical Exam Vitals and nursing note reviewed.  Constitutional:      General: He is not in acute distress.    Appearance: He is well-developed.  HENT:     Head: Normocephalic and atraumatic.     Mouth/Throat:     Mouth: Mucous membranes are moist.  Eyes:     Extraocular Movements: Extraocular movements intact.  Cardiovascular:     Rate and Rhythm: Normal rate and regular rhythm.  Pulmonary:      Effort: Pulmonary effort is normal.     Breath sounds: Wheezing (Trace end expiratory) and rales (Diffuse) present.  Abdominal:     Palpations: Abdomen is soft.  Musculoskeletal:        General: Normal range of motion.     Cervical back: Normal range of motion and neck supple.     Right lower leg: Edema (2+ to knee) present.     Left lower leg: Edema (1+) present.  Skin:    General: Skin is warm and dry.  Neurological:     General: No  focal deficit present.     Mental Status: He is alert and oriented to person, place, and time.  Psychiatric:        Mood and Affect: Mood normal.        Behavior: Behavior normal.     ED Results / Procedures / Treatments   Labs (all labs ordered are listed, but only abnormal results are displayed) Labs Reviewed  RESP PANEL BY RT-PCR (RSV, FLU A&B, COVID)  RVPGX2 - Abnormal; Notable for the following components:      Result Value   SARS Coronavirus 2 by RT PCR POSITIVE (*)    All other components within normal limits  BASIC METABOLIC PANEL - Abnormal; Notable for the following components:   Glucose, Bld 152 (*)    Creatinine, Ser 1.73 (*)    Calcium 8.5 (*)    GFR, Estimated 38 (*)    All other components within normal limits  BRAIN NATRIURETIC PEPTIDE - Abnormal; Notable for the following components:   B Natriuretic Peptide 351.9 (*)    All other components within normal limits  CBC - Abnormal; Notable for the following components:   WBC 2.7 (*)    Hemoglobin 11.2 (*)    HCT 38.2 (*)    MCH 25.3 (*)    MCHC 29.3 (*)    RDW 15.9 (*)    Platelets 105 (*)    All other components within normal limits  I-STAT CG4 LACTIC ACID, ED - Abnormal; Notable for the following components:   Lactic Acid, Venous 2.0 (*)    All other components within normal limits  CBG MONITORING, ED - Abnormal; Notable for the following components:   Glucose-Capillary 152 (*)    All other components within normal limits  I-STAT CG4 LACTIC ACID, ED    EKG EKG  Interpretation Date/Time:  Wednesday July 23 2023 16:09:41 EST Ventricular Rate:  99 PR Interval:  47 QRS Duration:  141 QT Interval:  409 QTC Calculation: 525 R Axis:   -2  Text Interpretation: Sinus rhythm Left bundle branch block Artifact in lead(s) I II aVR aVL aVF V1 No significant change since last tracing Confirmed by Elayne Snare ([id number]) on 07/23/2023 5:14:48 PM  Radiology DG Chest 2 View Result Date: 07/23/2023 CLINICAL DATA:  Shortness of breath. EXAM: CHEST - 2 VIEW COMPARISON:  Oct 03, 2020. FINDINGS: The heart size and mediastinal contours are within normal limits. Both lungs are clear. Status post left shoulder arthroplasty. IMPRESSION: No active cardiopulmonary disease. Electronically Signed   By: Lupita Raider M.D.   On: 07/23/2023 17:33    Procedures Procedures    Medications Ordered in ED Medications  furosemide (LASIX) injection 40 mg (has no administration in time range)  acetaminophen (TYLENOL) tablet 650 mg (650 mg Oral Given 07/23/23 1802)  albuterol (PROVENTIL) (2.5 MG/3ML) 0.083% nebulizer solution 2.5 mg (2.5 mg Nebulization Given 07/23/23 1802)    ED Course/ Medical Decision Making/ A&P                                 Medical Decision Making This patient presents to the ED with chief complaint(s) of SOB with pertinent past medical history of A-fib, CHF, hypertension, diabetes which further complicates the presenting complaint. The complaint involves an extensive differential diagnosis and also carries with it a high risk of complications and morbidity.    The differential diagnosis includes ACS, arrhythmia, anemia, pneumonia, pneumothorax, pulmonary edema, pleural  effusion, viral syndrome  Additional history obtained: Additional history obtained from N/A Records reviewed Primary Care Documents  ED Course and Reassessment: On patient's arrival he is hemodynamically stable, initially satting well on room air.  When I evaluated the patient in  the room he was satting 88% and desatted to 85 while talking and was placed on 2 L of nasal cannula with improvement to 98%.  The patient was given DuoNebs at his PCP with an improvement of his oxygenation and still some trace end expiratory wheeze and will be given additional neb here.  He additionally has some crackles at the bases and some edema in his legs.  He had labs performed in triage that did show mildly elevated BNP and reports he has missed a few doses of his Lasix and will be given a dose of IV Lasix here.  His COVID test is positive with mild leukopenia and a mildly elevated lactic.  Chest x-ray at the PCP showed possible bilateral infiltrates however was read clear here so will withhold on antibiotics at this time.  Plan for admission for his hypoxia in the setting of COVID-19.  Independent labs interpretation:  The following labs were independently interpreted: Mild leukopenia and mildly elevated BNP, COVID-positive, mildly elevated lactate, creatinine at baseline  Independent visualization of imaging: - I independently visualized the following imaging with scope of interpretation limited to determining acute life threatening conditions related to emergency care: Chest x-ray, which revealed no acute disease  Consultation: - Consulted or discussed management/test interpretation w/ external professional: Hospitalists  Consideration for admission or further workup: Patient requires admission for hypoxia in the setting of COVID-19 Social Determinants of health: N/A    Risk Prescription drug management. Decision regarding hospitalization.          Final Clinical Impression(s) / ED Diagnoses Final diagnoses:  Acute respiratory failure with hypoxia (HCC)  COVID-19    Rx / DC Orders ED Discharge Orders     None         Rexford Maus, DO 07/23/23 1813

## 2023-07-24 ENCOUNTER — Ambulatory Visit (HOSPITAL_BASED_OUTPATIENT_CLINIC_OR_DEPARTMENT_OTHER): Payer: PPO | Admitting: Internal Medicine

## 2023-07-24 DIAGNOSIS — N401 Enlarged prostate with lower urinary tract symptoms: Secondary | ICD-10-CM | POA: Diagnosis not present

## 2023-07-24 DIAGNOSIS — Z79899 Other long term (current) drug therapy: Secondary | ICD-10-CM | POA: Diagnosis not present

## 2023-07-24 DIAGNOSIS — Z7984 Long term (current) use of oral hypoglycemic drugs: Secondary | ICD-10-CM | POA: Diagnosis not present

## 2023-07-24 DIAGNOSIS — N179 Acute kidney failure, unspecified: Secondary | ICD-10-CM | POA: Diagnosis not present

## 2023-07-24 DIAGNOSIS — Z8249 Family history of ischemic heart disease and other diseases of the circulatory system: Secondary | ICD-10-CM | POA: Diagnosis not present

## 2023-07-24 DIAGNOSIS — D61818 Other pancytopenia: Secondary | ICD-10-CM | POA: Diagnosis not present

## 2023-07-24 DIAGNOSIS — Z87891 Personal history of nicotine dependence: Secondary | ICD-10-CM | POA: Diagnosis not present

## 2023-07-24 DIAGNOSIS — N1832 Chronic kidney disease, stage 3b: Secondary | ICD-10-CM | POA: Diagnosis not present

## 2023-07-24 DIAGNOSIS — U071 COVID-19: Secondary | ICD-10-CM | POA: Diagnosis not present

## 2023-07-24 DIAGNOSIS — J9601 Acute respiratory failure with hypoxia: Secondary | ICD-10-CM | POA: Diagnosis not present

## 2023-07-24 DIAGNOSIS — Z7951 Long term (current) use of inhaled steroids: Secondary | ICD-10-CM | POA: Diagnosis not present

## 2023-07-24 DIAGNOSIS — L97419 Non-pressure chronic ulcer of right heel and midfoot with unspecified severity: Secondary | ICD-10-CM | POA: Diagnosis not present

## 2023-07-24 DIAGNOSIS — E1151 Type 2 diabetes mellitus with diabetic peripheral angiopathy without gangrene: Secondary | ICD-10-CM | POA: Diagnosis not present

## 2023-07-24 DIAGNOSIS — E1169 Type 2 diabetes mellitus with other specified complication: Secondary | ICD-10-CM | POA: Diagnosis not present

## 2023-07-24 DIAGNOSIS — I480 Paroxysmal atrial fibrillation: Secondary | ICD-10-CM | POA: Diagnosis not present

## 2023-07-24 DIAGNOSIS — E1142 Type 2 diabetes mellitus with diabetic polyneuropathy: Secondary | ICD-10-CM | POA: Diagnosis not present

## 2023-07-24 DIAGNOSIS — Z7901 Long term (current) use of anticoagulants: Secondary | ICD-10-CM | POA: Diagnosis not present

## 2023-07-24 DIAGNOSIS — Z833 Family history of diabetes mellitus: Secondary | ICD-10-CM | POA: Diagnosis not present

## 2023-07-24 DIAGNOSIS — E1122 Type 2 diabetes mellitus with diabetic chronic kidney disease: Secondary | ICD-10-CM | POA: Diagnosis not present

## 2023-07-24 DIAGNOSIS — I428 Other cardiomyopathies: Secondary | ICD-10-CM | POA: Diagnosis not present

## 2023-07-24 DIAGNOSIS — E785 Hyperlipidemia, unspecified: Secondary | ICD-10-CM | POA: Diagnosis not present

## 2023-07-24 DIAGNOSIS — I152 Hypertension secondary to endocrine disorders: Secondary | ICD-10-CM | POA: Diagnosis not present

## 2023-07-24 DIAGNOSIS — Z794 Long term (current) use of insulin: Secondary | ICD-10-CM | POA: Diagnosis not present

## 2023-07-24 DIAGNOSIS — I5042 Chronic combined systolic (congestive) and diastolic (congestive) heart failure: Secondary | ICD-10-CM | POA: Diagnosis not present

## 2023-07-24 LAB — GLUCOSE, CAPILLARY
Glucose-Capillary: 163 mg/dL — ABNORMAL HIGH (ref 70–99)
Glucose-Capillary: 198 mg/dL — ABNORMAL HIGH (ref 70–99)
Glucose-Capillary: 174 mg/dL — ABNORMAL HIGH (ref 70–99)
Glucose-Capillary: 134 mg/dL — ABNORMAL HIGH (ref 70–99)

## 2023-07-24 LAB — CBC WITH DIFFERENTIAL/PLATELET
Abs Immature Granulocytes: 0.01 10*3/uL (ref 0.00–0.07)
Basophils Absolute: 0 10*3/uL (ref 0.0–0.1)
Basophils Relative: 0 %
Eosinophils Absolute: 0 10*3/uL (ref 0.0–0.5)
Eosinophils Relative: 0 %
HCT: 39.3 % (ref 39.0–52.0)
Hemoglobin: 11.5 g/dL — ABNORMAL LOW (ref 13.0–17.0)
Immature Granulocytes: 0 %
Lymphocytes Relative: 8 %
Lymphs Abs: 0.2 10*3/uL — ABNORMAL LOW (ref 0.7–4.0)
MCH: 25.3 pg — ABNORMAL LOW (ref 26.0–34.0)
MCHC: 29.3 g/dL — ABNORMAL LOW (ref 30.0–36.0)
MCV: 86.6 fL (ref 80.0–100.0)
Monocytes Absolute: 0.1 10*3/uL (ref 0.1–1.0)
Monocytes Relative: 3 %
Neutro Abs: 2.1 10*3/uL (ref 1.7–7.7)
Neutrophils Relative %: 89 %
Platelets: 101 10*3/uL — ABNORMAL LOW (ref 150–400)
RBC: 4.54 MIL/uL (ref 4.22–5.81)
RDW: 15.9 % — ABNORMAL HIGH (ref 11.5–15.5)
WBC: 2.4 10*3/uL — ABNORMAL LOW (ref 4.0–10.5)
nRBC: 0 % (ref 0.0–0.2)

## 2023-07-24 LAB — BASIC METABOLIC PANEL
Anion gap: 11 (ref 5–15)
BUN: 24 mg/dL — ABNORMAL HIGH (ref 8–23)
CO2: 27 mmol/L (ref 22–32)
Calcium: 8 mg/dL — ABNORMAL LOW (ref 8.9–10.3)
Chloride: 99 mmol/L (ref 98–111)
Creatinine, Ser: 1.66 mg/dL — ABNORMAL HIGH (ref 0.61–1.24)
GFR, Estimated: 40 mL/min — ABNORMAL LOW (ref >60)
Glucose, Bld: 181 mg/dL — ABNORMAL HIGH (ref 70–99)
Potassium: 4 mmol/L (ref 3.5–5.1)
Sodium: 137 mmol/L (ref 135–145)

## 2023-07-24 LAB — PROCALCITONIN: Procalcitonin: 0.11 ng/mL

## 2023-07-24 MED ORDER — FUROSEMIDE 10 MG/ML IJ SOLN
40.0000 mg | Freq: Four times a day (QID) | INTRAMUSCULAR | Status: AC
  Administered 2023-07-24 (×2): 40 mg via INTRAVENOUS
  Filled 2023-07-24 (×2): qty 4

## 2023-07-24 NOTE — Plan of Care (Signed)
  Problem: Coping: Goal: Psychosocial and spiritual needs will be supported Outcome: Progressing   Problem: Coping: Goal: Ability to adjust to condition or change in health will improve Outcome: Progressing   Problem: Fluid Volume: Goal: Ability to maintain a balanced intake and output will improve Outcome: Progressing   Problem: Skin Integrity: Goal: Risk for impaired skin integrity will decrease Outcome: Progressing   Problem: Activity: Goal: Risk for activity intolerance will decrease Outcome: Progressing   Problem: Nutrition: Goal: Adequate nutrition will be maintained Outcome: Progressing   Problem: Coping: Goal: Level of anxiety will decrease Outcome: Progressing   Problem: Pain Managment: Goal: General experience of comfort will improve and/or be controlled Outcome: Progressing   Problem: Safety: Goal: Ability to remain free from injury will improve Outcome: Progressing   Problem: Skin Integrity: Goal: Risk for impaired skin integrity will decrease Outcome: Progressing

## 2023-07-24 NOTE — Progress Notes (Signed)
SATURATION QUALIFICATIONS: (This note is used to comply with regulatory documentation for home oxygen)  Patient Saturations on Room Air at Rest = 88%  Patient Saturations on Room Air while Ambulating = 86%  Patient Saturations on 2 Liters of oxygen while Ambulating = 95%  Please briefly explain why patient needs home oxygen: 

## 2023-07-24 NOTE — Progress Notes (Signed)
 PROGRESS NOTE    Erik [name]  AOZ:[id number] DOB: 05/19/1941 DOA: 07/23/2023 PCP: Gaspar Garbe, MD   Brief Narrative:  HPI: Erik [name] is a 83 y.o. male with medical history significant for chronic HFmrEF, PAF on Xarelto, CKD stage IIIb, T2DM, HTN, chronic thrombocytopenia, HLD, BPH who presents to the ED for evaluation of shortness of breath.   Patient reports nonproductive cough and exertional dyspnea for the last 3 days.  He was seen by his PCP in office earlier today and noted to be hypoxic to 85% while on room air.  Both he and his wife tested positive for COVID-19.  He was given Solu-Medrol and DuoNeb treatment in the office with some improvement and subsequently sent to the ED for further evaluation and management.   Patient states that dyspnea improves with rest.  He reports chronic bilateral lower extremity edema which is somewhat increased from baseline.  He has been following with wound care for management of a chronic right plantar heel ulcer which has been healing well.   ED Course  Labs/Imaging on admission: I have personally reviewed following labs and imaging studies.   Initial vitals showed BP 123/99, pulse 102, RR 20, temp 99.4 F, SpO2 95% on room air.  Patient desaturated to 85% while on room air and placed on 2 L O2 via [place] with improvement to >94%.   Labs show WBC 2.7, hemoglobin 11.2, platelets 105,000, sodium 145, potassium 4.1, bicarb 29, BUN 17, creatinine 1.73, serum glucose 152, BNP 351.9.   SARS-CoV-2 PCR is positive.  Influenza and RSV negative.   2 view chest x-ray negative for focal consolidation, edema, effusion.   Patient was given IV Lasix 40 mg and albuterol nebulizer.  The hospitalist service was consulted to admit for further evaluation and management.  Assessment & Plan:   Principal Problem:   Acute respiratory failure with hypoxia (HCC) Active Problems:   COVID-19 virus infection   Chronic heart failure with mildly reduced  ejection fraction (HFmrEF, 41-49%) (HCC)   Type 2 diabetes mellitus (HCC)   Chronic kidney disease, stage 3b (HCC)   Paroxysmal atrial fibrillation (HCC)   Hypertension associated with diabetes (HCC)   Hyperlipidemia associated with type 2 diabetes mellitus (HCC)   Benign prostatic hyperplasia with lower urinary tract symptoms  Acute respiratory failure with hypoxia due to COVID-19 bronchospasm: CXR without evidence of pneumonia or pulmonary edema.  SpO2 as low as 85% while on room air, improved on 2 L O2 via .  Received Solu-Medrol PTA. -Still hypoxic.  Continue Brovana/Pulmicort twice daily, DuoNebs as needed and prednisone 40 mg p.o. daily.  Wean as able to.  Encouraged incentive spirometry. However upon personal review of the chest x-ray, I do think that there are some infiltrates.  Will check procalcitonin.   Chronic HFmrEF: Last EF 45-50%.  He has some increased lower extremity edema from baseline.  BNP 351 but baseline is unknown.  CXR without evidence of pulmonary edema or effusion. -S/p IV Lasix 40 mg given in the ED, no further Lasix given.  Appears slightly fluid overloaded to me.  I will diuresing with Lasix 40 mg IV twice daily.  Continue Toprol-XL.  Reassess tomorrow.   Paroxysmal atrial fibrillation: Sinus rhythm on admission.  Continue Toprol-XL 75 mg daily and Xarelto 15 mg daily.   CKD stage IIIb: Renal function stable.   Pancytopenia: Patient with chronic thrombocytopenia and anemia.  Leukopenia is new, likely secondary to viral infection.  Continue to monitor.  Type 2 diabetes: Placed on SSI.   Hypertension: Continue Toprol-XL.   Hyperlipidemia: Continue atorvastatin.   BPH: Continue Flomax and Proscar.   Diabetic peripheral neuropathy/history of trigeminal neuralgia: Continue gabapentin and Trileptal.    DVT prophylaxis: Xarelto   Code Status: Full Code  Family Communication:  None present at bedside.  Plan of care discussed with patient in length  and he/she verbalized understanding and agreed with it.  Status is: Observation The patient will require care spanning > 2 midnights and should be moved to inpatient because: Patient still hypoxic   Estimated body mass index is 24.97 kg/m as calculated from the following:   Height as of this encounter: 5\' 11"  (1.803 m).   Weight as of this encounter: 81.2 kg.    Nutritional Assessment: Body mass index is 24.97 kg/m.[name] Seen by dietician.  I agree with the assessment and plan as outlined below: Nutrition Status:        . Skin Assessment: I have examined the patient's skin and I agree with the wound assessment as performed by the wound care RN as outlined below:    Consultants:  None  Procedures:  None  Antimicrobials:  Anti-infectives (From admission, onward)    None         Subjective: Patient seen and examined, he says that his breathing is much improved compared to yesterday.  He had no other complaint.  Objective: Vitals:   07/24/23 0533 07/24/23 0908 07/24/23 0940 07/24/23 1204  BP: (!) 153/83 137/60  (!) 141/63  Pulse: 87 77  74  Resp: 16 16  16   Temp: 98.3 F (36.8 C) 98 F (36.7 C)  98.4 F (36.9 C)  TempSrc:      SpO2: 97% 97% 95% 98%  Weight:      Height:        Intake/Output Summary (Last 24 hours) at 07/24/2023 1212 Last data filed at 07/24/2023 0530 Gross per 24 hour  Intake 240 ml  Output 1025 ml  Net -785 ml   Filed Weights   07/23/23 1547  Weight: 81.2 kg    Examination:  General exam: Appears calm and comfortable  Respiratory system: Coarse breath sounds with questionable crackles and some rhonchi. Respiratory effort normal. Cardiovascular system: S1 & S2 heard, RRR. No JVD, murmurs, rubs, gallops or clicks.  +1 pitting edema edema bilateral lower extremity Gastrointestinal system: Abdomen is nondistended, soft and nontender. No organomegaly or masses felt. Normal bowel sounds heard. Central nervous system: Alert and oriented.  No focal neurological deficits. Extremities: Symmetric 5 x 5 power. Skin: No rashes, lesions or ulcers Psychiatry: Judgement and insight appear normal. Mood & affect appropriate.    Data Reviewed: I have personally reviewed following labs and imaging studies  CBC: Recent Labs  Lab 07/23/23 1601 07/24/23 0555  WBC 2.7* 2.4*  NEUTROABS  --  2.1  HGB 11.2* 11.5*  HCT 38.2* 39.3  MCV 86.4 86.6  PLT 105* 101*   Basic Metabolic Panel: Recent Labs  Lab 07/23/23 1601 07/24/23 0555  NA 145 137  K 4.1 4.0  CL 105 99  CO2 29 27  GLUCOSE 152* 181*  BUN 17 24*  CREATININE 1.73* 1.66*  CALCIUM 8.5* 8.0*   GFR: Estimated Creatinine Clearance: 35.3 mL/min (A) (by C-G formula based on SCr of 1.66 mg/dL (H)). Liver Function Tests: No results for input(s): "AST", "ALT", "ALKPHOS", "BILITOT", "PROT", "ALBUMIN" in the last 168 hours. No results for input(s): "LIPASE", "AMYLASE" in the last 168 hours. No  results for input(s): "AMMONIA" in the last 168 hours. Coagulation Profile: No results for input(s): "INR", "PROTIME" in the last 168 hours. Cardiac Enzymes: No results for input(s): "CKTOTAL", "CKMB", "CKMBINDEX", "TROPONINI" in the last 168 hours. BNP (last 3 results) No results for input(s): "PROBNP" in the last 8760 hours. HbA1C: No results for input(s): "HGBA1C" in the last 72 hours. CBG: Recent Labs  Lab 07/23/23 1702 07/23/23 2143 07/24/23 0741 07/24/23 1206  GLUCAP 152* 204* 163* 198*   Lipid Profile: No results for input(s): "CHOL", "HDL", "LDLCALC", "TRIG", "CHOLHDL", "LDLDIRECT" in the last 72 hours. Thyroid Function Tests: No results for input(s): "TSH", "T4TOTAL", "FREET4", "T3FREE", "THYROIDAB" in the last 72 hours. Anemia Panel: No results for input(s): "VITAMINB12", "FOLATE", "FERRITIN", "TIBC", "IRON", "RETICCTPCT" in the last 72 hours. Sepsis Labs: Recent Labs  Lab 07/23/23 1610 07/23/23 1844  LATICACIDVEN 2.0* 2.1*    Recent Results (from the past  240 hours)  Resp panel by RT-PCR (RSV, Flu A&B, Covid) Anterior Nasal Swab     Status: Abnormal   Collection Time: 07/23/23  4:04 PM   Specimen: Anterior Nasal Swab  Result Value Ref Range Status   SARS Coronavirus 2 by RT PCR POSITIVE (A) NEGATIVE Final    Comment: (NOTE) SARS-CoV-2 target nucleic acids are DETECTED.  The SARS-CoV-2 RNA is generally detectable in upper respiratory specimens during the acute phase of infection. Positive results are indicative of the presence of the identified virus, but do not rule out bacterial infection or co-infection with other pathogens not detected by the test. Clinical correlation with patient history and other diagnostic information is necessary to determine patient infection status. The expected result is Negative.  Fact Sheet for Patients: BloggerCourse.com  Fact Sheet for Healthcare Providers: SeriousBroker.it  This test is not yet approved or cleared by the [place] FDA and  has been authorized for detection and/or diagnosis of SARS-CoV-2 by FDA under an Emergency Use Authorization (EUA).  This EUA will remain in effect (meaning this test can be used) for the duration of  the COVID-19 declaration under Section 564(b)(1) of the A ct, 21 U.S.C. section 360bbb-3(b)(1), unless the authorization is terminated or revoked sooner.     Influenza A by PCR NEGATIVE NEGATIVE Final   Influenza B by PCR NEGATIVE NEGATIVE Final    Comment: (NOTE) The Xpert Xpress SARS-CoV-2/FLU/RSV plus assay is intended as an aid in the diagnosis of influenza from Nasopharyngeal swab specimens and should not be used as a sole basis for treatment. Nasal washings and aspirates are unacceptable for Xpert Xpress SARS-CoV-2/FLU/RSV testing.  Fact Sheet for Patients: BloggerCourse.com  Fact Sheet for Healthcare Providers: SeriousBroker.it  This test is not yet  approved or cleared by the [place] FDA and has been authorized for detection and/or diagnosis of SARS-CoV-2 by FDA under an Emergency Use Authorization (EUA). This EUA will remain in effect (meaning this test can be used) for the duration of the COVID-19 declaration under Section 564(b)(1) of the Act, 21 U.S.C. section 360bbb-3(b)(1), unless the authorization is terminated or revoked.     Resp Syncytial Virus by PCR NEGATIVE NEGATIVE Final    Comment: (NOTE) Fact Sheet for Patients: BloggerCourse.com  Fact Sheet for Healthcare Providers: SeriousBroker.it  This test is not yet approved or cleared by the [place] FDA and has been authorized for detection and/or diagnosis of SARS-CoV-2 by FDA under an Emergency Use Authorization (EUA). This EUA will remain in effect (meaning this test can be used) for the duration of the COVID-19  declaration under Section 564(b)(1) of the Act, 21 U.S.C. section 360bbb-3(b)(1), unless the authorization is terminated or revoked.  Performed at [facility], [address] [place], [place] [address]      Radiology Studies: DG Chest 2 View Result Date: 07/23/2023 CLINICAL DATA:  Shortness of breath. EXAM: CHEST - 2 VIEW COMPARISON:  Oct 03, 2020. FINDINGS: The heart size and mediastinal contours are within normal limits. Both lungs are clear. Status post left shoulder arthroplasty. IMPRESSION: No active cardiopulmonary disease. Electronically Signed   By: Lupita Raider M.D.   On: 07/23/2023 17:33    Scheduled Meds:  arformoterol  15 mcg Nebulization BID   atorvastatin  40 mg Oral Daily   budesonide (PULMICORT) nebulizer solution  0.25 mg Nebulization BID   feeding supplement  237 mL Oral BID BM   finasteride  5 mg Oral Daily   gabapentin  300 mg Oral BID   guaiFENesin  600 mg Oral BID   insulin aspart  0-15 Units Subcutaneous TID WC   insulin aspart  0-5 Units  Subcutaneous QHS   metoprolol succinate  75 mg Oral Daily   OXcarbazepine  75 mg Oral BID   pantoprazole  40 mg Oral Daily   predniSONE  40 mg Oral Q breakfast   Rivaroxaban  15 mg Oral Q supper   sodium chloride flush  3 mL Intravenous Q12H   tamsulosin  0.4 mg Oral BID   Continuous Infusions:   LOS: 0 days   Hughie Closs, MD [facility]  07/24/2023, 12:12 PM   *Please note that this is a verbal dictation therefore any spelling or grammatical errors are due to the "Dragon Medical One" system interpretation.  Please page via Amion and do not message via secure chat for urgent patient care matters. Secure chat can be used for non urgent patient care matters.  How to contact the [facility] Attending or Consulting provider 7A - 7P or covering provider during after hours 7P -7A, for this patient?  Check the care team in [facility] and look for a) attending/consulting [facility] provider listed and b) the [facility] team listed. Page or secure chat 7A-7P. Log into [email or web address] and use 's universal password to access. If you do not have the password, please contact the hospital operator. Locate the [facility] provider you are looking for under [facility] and page to a number that you can be directly reached. If you still have difficulty reaching the provider, please page the [facility] (Director on Call) for the Hospitalists listed on amion for assistance.

## 2023-07-24 NOTE — Plan of Care (Addendum)
 VSS. Patient on 3L [place]. No c/o pain. Patient ambulating to bathroom x1 with RW. BG 204 at bedtime. BM this shift. No acute events overnight.  Problem: Education: Goal: Knowledge of risk factors and measures for prevention of condition will improve Outcome: Progressing   Problem: Respiratory: Goal: Will maintain a patent airway Outcome: Progressing Goal: Complications related to the disease process, condition or treatment will be avoided or minimized Outcome: Progressing   Problem: Education: Goal: Ability to describe self-care measures that may prevent or decrease complications (Diabetes Survival Skills Education) will improve Outcome: Progressing   Problem: Fluid Volume: Goal: Ability to maintain a balanced intake and output will improve Outcome: Progressing   Problem: Metabolic: Goal: Ability to maintain appropriate glucose levels will improve Outcome: Progressing   Problem: Nutritional: Goal: Maintenance of adequate nutrition will improve Outcome: Progressing   Problem: Tissue Perfusion: Goal: Adequacy of tissue perfusion will improve Outcome: Progressing   Problem: Education: Goal: Knowledge of General Education information will improve Description: Including pain rating scale, medication(s)/side effects and non-pharmacologic comfort measures Outcome: Progressing   Problem: Clinical Measurements: Goal: Ability to maintain clinical measurements within normal limits will improve Outcome: Progressing Goal: Will remain free from infection Outcome: Progressing   Problem: Activity: Goal: Risk for activity intolerance will decrease Outcome: Progressing   Problem: Safety: Goal: Ability to remain free from injury will improve Outcome: Progressing   Problem: Skin Integrity: Goal: Risk for impaired skin integrity will decrease Outcome: Progressing

## 2023-07-24 NOTE — Progress Notes (Signed)
   07/24/23 1309  TOC Brief Assessment  Insurance and Status Reviewed  Patient has primary care physician Yes  Home environment has been reviewed Home w/ spouse  Prior level of function: Independent  Prior/Current Home Services No current home services  Social Drivers of Health Review SDOH reviewed no interventions necessary  Readmission risk has been reviewed Yes  Transition of care needs transition of care needs identified, TOC will continue to follow   TOC following for O2 requirement at discharge.

## 2023-07-24 NOTE — Progress Notes (Signed)
   07/24/23 0940  Oxygen Therapy/Pulse Ox  O2 Device (S)  Room Air (Placed pt on room air per verbal MD request, 95%. Will recheck.)  O2 Therapy Room air  SpO2 95 %  Safety Instructions Yes (Comment)

## 2023-07-25 DIAGNOSIS — J9601 Acute respiratory failure with hypoxia: Secondary | ICD-10-CM | POA: Diagnosis not present

## 2023-07-25 LAB — BASIC METABOLIC PANEL
Anion gap: 13 (ref 5–15)
BUN: 45 mg/dL — ABNORMAL HIGH (ref 8–23)
CO2: 30 mmol/L (ref 22–32)
Calcium: 8.3 mg/dL — ABNORMAL LOW (ref 8.9–10.3)
Chloride: 95 mmol/L — ABNORMAL LOW (ref 98–111)
Creatinine, Ser: 1.84 mg/dL — ABNORMAL HIGH (ref 0.61–1.24)
GFR, Estimated: 36 mL/min — ABNORMAL LOW (ref >60)
Glucose, Bld: 139 mg/dL — ABNORMAL HIGH (ref 70–99)
Potassium: 4 mmol/L (ref 3.5–5.1)
Sodium: 138 mmol/L (ref 135–145)

## 2023-07-25 LAB — CBC WITH DIFFERENTIAL/PLATELET
Abs Immature Granulocytes: 0.06 10*3/uL (ref 0.00–0.07)
Basophils Absolute: 0 10*3/uL (ref 0.0–0.1)
Basophils Relative: 0 %
Eosinophils Absolute: 0 10*3/uL (ref 0.0–0.5)
Eosinophils Relative: 0 %
HCT: 41.5 % (ref 39.0–52.0)
Hemoglobin: 12.4 g/dL — ABNORMAL LOW (ref 13.0–17.0)
Immature Granulocytes: 1 %
Lymphocytes Relative: 8 %
Lymphs Abs: 0.6 10*3/uL — ABNORMAL LOW (ref 0.7–4.0)
MCH: 25 pg — ABNORMAL LOW (ref 26.0–34.0)
MCHC: 29.9 g/dL — ABNORMAL LOW (ref 30.0–36.0)
MCV: 83.7 fL (ref 80.0–100.0)
Monocytes Absolute: 0.4 10*3/uL (ref 0.1–1.0)
Monocytes Relative: 6 %
Neutro Abs: 6 10*3/uL (ref 1.7–7.7)
Neutrophils Relative %: 85 %
Platelets: 129 10*3/uL — ABNORMAL LOW (ref 150–400)
RBC: 4.96 MIL/uL (ref 4.22–5.81)
RDW: 16.1 % — ABNORMAL HIGH (ref 11.5–15.5)
WBC: 7.1 10*3/uL (ref 4.0–10.5)
nRBC: 0 % (ref 0.0–0.2)

## 2023-07-25 LAB — GLUCOSE, CAPILLARY
Glucose-Capillary: 139 mg/dL — ABNORMAL HIGH (ref 70–99)
Glucose-Capillary: 133 mg/dL — ABNORMAL HIGH (ref 70–99)
Glucose-Capillary: 129 mg/dL — ABNORMAL HIGH (ref 70–99)
Glucose-Capillary: 155 mg/dL — ABNORMAL HIGH (ref 70–99)
Glucose-Capillary: 281 mg/dL — ABNORMAL HIGH (ref 70–99)

## 2023-07-25 MED ORDER — FUROSEMIDE 10 MG/ML IJ SOLN
40.0000 mg | Freq: Four times a day (QID) | INTRAMUSCULAR | Status: AC
  Administered 2023-07-25 (×2): 40 mg via INTRAVENOUS
  Filled 2023-07-25 (×2): qty 4

## 2023-07-25 MED ORDER — DEXTROMETHORPHAN POLISTIREX ER 30 MG/5ML PO SUER
30.0000 mg | Freq: Two times a day (BID) | ORAL | Status: AC | PRN
  Administered 2023-07-25: 30 mg via ORAL
  Filled 2023-07-25 (×3): qty 5

## 2023-07-25 NOTE — Progress Notes (Signed)
 PROGRESS NOTE    Erik [name]  AOZ:[id number] DOB: 13-Feb-1941 DOA: 07/23/2023 PCP: Gaspar Garbe, MD   Brief Narrative:  HPI: Erik [name] is a 83 y.o. male with medical history significant for chronic HFmrEF, PAF on Xarelto, CKD stage IIIb, T2DM, HTN, chronic thrombocytopenia, HLD, BPH who presents to the ED for evaluation of shortness of breath.   Patient reports nonproductive cough and exertional dyspnea for the last 3 days.  He was seen by his PCP in office earlier today and noted to be hypoxic to 85% while on room air.  Both he and his wife tested positive for COVID-19.  He was given Solu-Medrol and DuoNeb treatment in the office with some improvement and subsequently sent to the ED for further evaluation and management.   Patient states that dyspnea improves with rest.  He reports chronic bilateral lower extremity edema which is somewhat increased from baseline.  He has been following with wound care for management of a chronic right plantar heel ulcer which has been healing well.   ED Course  Labs/Imaging on admission: I have personally reviewed following labs and imaging studies.   Initial vitals showed BP 123/99, pulse 102, RR 20, temp 99.4 F, SpO2 95% on room air.  Patient desaturated to 85% while on room air and placed on 2 L O2 via [place] with improvement to >94%.   Labs show WBC 2.7, hemoglobin 11.2, platelets 105,000, sodium 145, potassium 4.1, bicarb 29, BUN 17, creatinine 1.73, serum glucose 152, BNP 351.9.   SARS-CoV-2 PCR is positive.  Influenza and RSV negative.   2 view chest x-ray negative for focal consolidation, edema, effusion.   Patient was given IV Lasix 40 mg and albuterol nebulizer.  The hospitalist service was consulted to admit for further evaluation and management.  Assessment & Plan:   Principal Problem:   Acute respiratory failure with hypoxia (HCC) Active Problems:   COVID-19 virus infection   Chronic heart failure with mildly reduced  ejection fraction (HFmrEF, 41-49%) (HCC)   Type 2 diabetes mellitus (HCC)   Chronic kidney disease, stage 3b (HCC)   Paroxysmal atrial fibrillation (HCC)   Hypertension associated with diabetes (HCC)   Hyperlipidemia associated with type 2 diabetes mellitus (HCC)   Benign prostatic hyperplasia with lower urinary tract symptoms  Acute respiratory failure with hypoxia due to COVID-19 bronchospasm: CXR without evidence of pneumonia or pulmonary edema.  SpO2 as low as 85% while on room air, improved on 2 L O2 via [place].  Received Solu-Medrol PTA. -Still hypoxic and dropped to 88% with ambulation on room air.  Continue Brovana/Pulmicort twice daily, DuoNebs as needed and prednisone 40 mg p.o. daily.  Wean as able to.  Encouraged incentive spirometry every hour.   Chronic HFmrEF: Last EF 45-50%.  He has some increased lower extremity edema from baseline.  BNP 351 but baseline is unknown.  CXR without evidence of pulmonary edema or effusion. -S/p IV Lasix 40 mg given in the ED, no further Lasix given.  Appears slightly fluid overloaded to me.  Again today, I will diuresing with Lasix 40 mg IV twice daily as this appears to be helping with his hypoxia.  Continue Toprol-XL.  Reassess tomorrow.   Paroxysmal atrial fibrillation: Sinus rhythm on admission.  Continue Toprol-XL 75 mg daily and Xarelto 15 mg daily.   CKD stage IIIb: Renal function stable.   Pancytopenia: Patient with chronic thrombocytopenia and anemia.  Leukopenia is new, likely secondary to viral infection.  Continue to monitor.  Type 2 diabetes: Placed on SSI.   Hypertension: Continue Toprol-XL.   Hyperlipidemia: Continue atorvastatin.   BPH: Continue Flomax and Proscar.   Diabetic peripheral neuropathy/history of trigeminal neuralgia: Continue gabapentin and Trileptal.    DVT prophylaxis: Xarelto   Code Status: Full Code  Family Communication:  None present at bedside.  Plan of care discussed with patient in length and  he/she verbalized understanding and agreed with it.  Status is: Inpatient Remains inpatient appropriate because: Still hypoxic, will reassess tomorrow for possible discharge.     Estimated body mass index is 24.97 kg/m as calculated from the following:   Height as of this encounter: 5\' 11"  (1.803 m).   Weight as of this encounter: 81.2 kg.    Nutritional Assessment: Body mass index is 24.97 kg/m.[name] Seen by dietician.  I agree with the assessment and plan as outlined below: Nutrition Status:        . Skin Assessment: I have examined the patient's skin and I agree with the wound assessment as performed by the wound care RN as outlined below:    Consultants:  None  Procedures:  None  Antimicrobials:  Anti-infectives (From admission, onward)    None         Subjective: Seen and examined.  Eating breakfast in the bed.  Denies any shortness of breath or any other complaint.  Nursing reported some confusion however I saw him crying and then and there, he was fully alert and oriented.  Objective: Vitals:   07/25/23 0623 07/25/23 0737 07/25/23 0833 07/25/23 1232  BP: 132/83  (!) 153/63 (!) 117/43  Pulse: 78  92 72  Resp:    17  Temp: 98.1 F (36.7 C)   99.3 F (37.4 C)  TempSrc: Oral     SpO2: 94% 98%  92%  Weight:      Height:        Intake/Output Summary (Last 24 hours) at 07/25/2023 1332 Last data filed at 07/25/2023 0543 Gross per 24 hour  Intake 240 ml  Output 875 ml  Net -635 ml   Filed Weights   07/23/23 1547  Weight: 81.2 kg    Examination:  General exam: Appears calm and comfortable  Respiratory system: Coarse breath sounds with faint rhonchi and crackles, overall improved compared to yesterday.[name] Respiratory effort normal. Cardiovascular system: S1 & S2 heard, RRR. No JVD, murmurs, rubs, gallops or clicks. No pedal edema. Gastrointestinal system: Abdomen is nondistended, soft and nontender. No organomegaly or masses felt. Normal bowel sounds  heard. Central nervous system: Alert and oriented. No focal neurological deficits. Extremities: Symmetric 5 x 5 power. Skin: No rashes, lesions or ulcers.   Data Reviewed: I have personally reviewed following labs and imaging studies  CBC: Recent Labs  Lab 07/23/23 1601 07/24/23 0555 07/25/23 0541  WBC 2.7* 2.4* 7.1  NEUTROABS  --  2.1 6.0  HGB 11.2* 11.5* 12.4*  HCT 38.2* 39.3 41.5  MCV 86.4 86.6 83.7  PLT 105* 101* 129*   Basic Metabolic Panel: Recent Labs  Lab 07/23/23 1601 07/24/23 0555 07/25/23 0541  NA 145 137 138  K 4.1 4.0 4.0  CL 105 99 95*  CO2 29 27 30   GLUCOSE 152* 181* 139*  BUN 17 24* 45*  CREATININE 1.73* 1.66* 1.84*  CALCIUM 8.5* 8.0* 8.3*   GFR: Estimated Creatinine Clearance: 31.8 mL/min (A) (by C-G formula based on SCr of 1.84 mg/dL (H)). Liver Function Tests: No results for input(s): "AST", "ALT", "ALKPHOS", "BILITOT", "PROT", "ALBUMIN" in  the last 168 hours. No results for input(s): "LIPASE", "AMYLASE" in the last 168 hours. No results for input(s): "AMMONIA" in the last 168 hours. Coagulation Profile: No results for input(s): "INR", "PROTIME" in the last 168 hours. Cardiac Enzymes: No results for input(s): "CKTOTAL", "CKMB", "CKMBINDEX", "TROPONINI" in the last 168 hours. BNP (last 3 results) No results for input(s): "PROBNP" in the last 8760 hours. HbA1C: No results for input(s): "HGBA1C" in the last 72 hours. CBG: Recent Labs  Lab 07/24/23 1206 07/24/23 1703 07/24/23 2148 07/25/23 0728 07/25/23 1234  GLUCAP 198* 174* 134* 133* 129*   Lipid Profile: No results for input(s): "CHOL", "HDL", "LDLCALC", "TRIG", "CHOLHDL", "LDLDIRECT" in the last 72 hours. Thyroid Function Tests: No results for input(s): "TSH", "T4TOTAL", "FREET4", "T3FREE", "THYROIDAB" in the last 72 hours. Anemia Panel: No results for input(s): "VITAMINB12", "FOLATE", "FERRITIN", "TIBC", "IRON", "RETICCTPCT" in the last 72 hours. Sepsis Labs: Recent Labs  Lab  07/23/23 1610 07/23/23 1844 07/24/23 0555  PROCALCITON  --   --  0.11  LATICACIDVEN 2.0* 2.1*  --     Recent Results (from the past 240 hours)  Resp panel by RT-PCR (RSV, Flu A&B, Covid) Anterior Nasal Swab     Status: Abnormal   Collection Time: 07/23/23  4:04 PM   Specimen: Anterior Nasal Swab  Result Value Ref Range Status   SARS Coronavirus 2 by RT PCR POSITIVE (A) NEGATIVE Final    Comment: (NOTE) SARS-CoV-2 target nucleic acids are DETECTED.  The SARS-CoV-2 RNA is generally detectable in upper respiratory specimens during the acute phase of infection. Positive results are indicative of the presence of the identified virus, but do not rule out bacterial infection or co-infection with other pathogens not detected by the test. Clinical correlation with patient history and other diagnostic information is necessary to determine patient infection status. The expected result is Negative.  Fact Sheet for Patients: BloggerCourse.com  Fact Sheet for Healthcare Providers: SeriousBroker.it  This test is not yet approved or cleared by the [place] FDA and  has been authorized for detection and/or diagnosis of SARS-CoV-2 by FDA under an Emergency Use Authorization (EUA).  This EUA will remain in effect (meaning this test can be used) for the duration of  the COVID-19 declaration under Section 564(b)(1) of the A ct, 21 U.S.C. section 360bbb-3(b)(1), unless the authorization is terminated or revoked sooner.     Influenza A by PCR NEGATIVE NEGATIVE Final   Influenza B by PCR NEGATIVE NEGATIVE Final    Comment: (NOTE) The Xpert Xpress SARS-CoV-2/FLU/RSV plus assay is intended as an aid in the diagnosis of influenza from Nasopharyngeal swab specimens and should not be used as a sole basis for treatment. Nasal washings and aspirates are unacceptable for Xpert Xpress SARS-CoV-2/FLU/RSV testing.  Fact Sheet for  Patients: BloggerCourse.com  Fact Sheet for Healthcare Providers: SeriousBroker.it  This test is not yet approved or cleared by the [place] FDA and has been authorized for detection and/or diagnosis of SARS-CoV-2 by FDA under an Emergency Use Authorization (EUA). This EUA will remain in effect (meaning this test can be used) for the duration of the COVID-19 declaration under Section 564(b)(1) of the Act, 21 U.S.C. section 360bbb-3(b)(1), unless the authorization is terminated or revoked.     Resp Syncytial Virus by PCR NEGATIVE NEGATIVE Final    Comment: (NOTE) Fact Sheet for Patients: BloggerCourse.com  Fact Sheet for Healthcare Providers: SeriousBroker.it  This test is not yet approved or cleared by the [place] FDA and has been  authorized for detection and/or diagnosis of SARS-CoV-2 by FDA under an Emergency Use Authorization (EUA). This EUA will remain in effect (meaning this test can be used) for the duration of the COVID-19 declaration under Section 564(b)(1) of the Act, 21 U.S.C. section 360bbb-3(b)(1), unless the authorization is terminated or revoked.  Performed at [facility], [address], [place], [place] [address]      Radiology Studies: DG Chest 2 View Result Date: 07/23/2023 CLINICAL DATA:  Shortness of breath. EXAM: CHEST - 2 VIEW COMPARISON:  Oct 03, 2020. FINDINGS: The heart size and mediastinal contours are within normal limits. Both lungs are clear. Status post left shoulder arthroplasty. IMPRESSION: No active cardiopulmonary disease. Electronically Signed   By: Lupita Raider M.D.   On: 07/23/2023 17:33    Scheduled Meds:  arformoterol  15 mcg Nebulization BID   atorvastatin  40 mg Oral Daily   budesonide (PULMICORT) nebulizer solution  0.25 mg Nebulization BID   feeding supplement  237 mL Oral BID BM   finasteride  5 mg  Oral Daily   gabapentin  300 mg Oral BID   guaiFENesin  600 mg Oral BID   insulin aspart  0-15 Units Subcutaneous TID WC   insulin aspart  0-5 Units Subcutaneous QHS   metoprolol succinate  75 mg Oral Daily   OXcarbazepine  75 mg Oral BID   pantoprazole  40 mg Oral Daily   predniSONE  40 mg Oral Q breakfast   Rivaroxaban  15 mg Oral Q supper   sodium chloride flush  3 mL Intravenous Q12H   tamsulosin  0.4 mg Oral BID   Continuous Infusions:   LOS: 1 day   Hughie Closs, MD [facility]  07/25/2023, 1:32 PM   *Please note that this is a verbal dictation therefore any spelling or grammatical errors are due to the "Dragon Medical One" system interpretation.  Please page via Amion and do not message via secure chat for urgent patient care matters. Secure chat can be used for non urgent patient care matters.  How to contact the [facility] Attending or Consulting provider 7A - 7P or covering provider during after hours 7P -7A, for this patient?  Check the care team in [facility] and look for a) attending/consulting [facility] provider listed and b) the [facility] team listed. Page or secure chat 7A-7P. Log into [email or web address] and use [place]'s universal password to access. If you do not have the password, please contact the hospital operator. Locate the [facility] provider you are looking for under [facility] and page to a number that you can be directly reached. If you still have difficulty reaching the provider, please page the [facility] (Director on Call) for the Hospitalists listed on amion for assistance.

## 2023-07-25 NOTE — Plan of Care (Addendum)
 VSS. Patient on 2L [place]. No c/o pain. BG 134 at bedtime. Patient given PRN Delsym for cough. No acute events overnight.   Problem: Respiratory: Goal: Will maintain a patent airway Outcome: Progressing Goal: Complications related to the disease process, condition or treatment will be avoided or minimized Outcome: Progressing   Problem: Education: Goal: Ability to describe self-care measures that may prevent or decrease complications (Diabetes Survival Skills Education) will improve Outcome: Progressing   Problem: Metabolic: Goal: Ability to maintain appropriate glucose levels will improve Outcome: Progressing   Problem: Nutritional: Goal: Maintenance of adequate nutrition will improve Outcome: Progressing   Problem: Tissue Perfusion: Goal: Adequacy of tissue perfusion will improve Outcome: Progressing   Problem: Education: Goal: Knowledge of General Education information will improve Description: Including pain rating scale, medication(s)/side effects and non-pharmacologic comfort measures Outcome: Progressing   Problem: Clinical Measurements: Goal: Ability to maintain clinical measurements within normal limits will improve Outcome: Progressing Goal: Will remain free from infection Outcome: Progressing   Problem: Safety: Goal: Ability to remain free from injury will improve Outcome: Progressing

## 2023-07-25 NOTE — Plan of Care (Signed)

## 2023-07-26 ENCOUNTER — Inpatient Hospital Stay (HOSPITAL_COMMUNITY)

## 2023-07-26 DIAGNOSIS — J9601 Acute respiratory failure with hypoxia: Secondary | ICD-10-CM | POA: Diagnosis not present

## 2023-07-26 LAB — GLUCOSE, CAPILLARY
Glucose-Capillary: 132 mg/dL — ABNORMAL HIGH (ref 70–99)
Glucose-Capillary: 159 mg/dL — ABNORMAL HIGH (ref 70–99)
Glucose-Capillary: 269 mg/dL — ABNORMAL HIGH (ref 70–99)
Glucose-Capillary: 337 mg/dL — ABNORMAL HIGH (ref 70–99)

## 2023-07-26 LAB — BASIC METABOLIC PANEL
Anion gap: 12 (ref 5–15)
BUN: 55 mg/dL — ABNORMAL HIGH (ref 8–23)
CO2: 33 mmol/L — ABNORMAL HIGH (ref 22–32)
Calcium: 8.2 mg/dL — ABNORMAL LOW (ref 8.9–10.3)
Chloride: 93 mmol/L — ABNORMAL LOW (ref 98–111)
Creatinine, Ser: 1.96 mg/dL — ABNORMAL HIGH (ref 0.61–1.24)
GFR, Estimated: 33 mL/min — ABNORMAL LOW (ref >60)
Glucose, Bld: 133 mg/dL — ABNORMAL HIGH (ref 70–99)
Potassium: 3.6 mmol/L (ref 3.5–5.1)
Sodium: 138 mmol/L (ref 135–145)

## 2023-07-26 MED ORDER — FUROSEMIDE 10 MG/ML IJ SOLN
40.0000 mg | Freq: Four times a day (QID) | INTRAMUSCULAR | Status: AC
  Administered 2023-07-26 (×2): 40 mg via INTRAVENOUS
  Filled 2023-07-26 (×2): qty 4

## 2023-07-26 NOTE — Progress Notes (Signed)
 SATURATION QUALIFICATIONS: (This note is used to comply with regulatory documentation for home oxygen)  Patient Saturations on Room Air at Rest = 85%  Patient Saturations on Room Air while Ambulating = 82%  Patient Saturations on 2 Liters of oxygen while Ambulating = 93%  Please briefly explain why patient needs home oxygen:

## 2023-07-26 NOTE — Progress Notes (Signed)
 PROGRESS NOTE    Erik [name]  ZOX:[id number] DOB: 12-24-1940 DOA: 07/23/2023 PCP: Gaspar Garbe, MD   Brief Narrative:  HPI: Erik [name] is a 83 y.o. male with medical history significant for chronic HFmrEF, PAF on Xarelto, CKD stage IIIb, T2DM, HTN, chronic thrombocytopenia, HLD, BPH who presents to the ED for evaluation of shortness of breath.   Patient reports nonproductive cough and exertional dyspnea for the last 3 days.  He was seen by his PCP in office earlier today and noted to be hypoxic to 85% while on room air.  Both he and his wife tested positive for COVID-19.  He was given Solu-Medrol and DuoNeb treatment in the office with some improvement and subsequently sent to the ED for further evaluation and management.   Patient states that dyspnea improves with rest.  He reports chronic bilateral lower extremity edema which is somewhat increased from baseline.  He has been following with wound care for management of a chronic right plantar heel ulcer which has been healing well.   ED Course  Labs/Imaging on admission: I have personally reviewed following labs and imaging studies.   Initial vitals showed BP 123/99, pulse 102, RR 20, temp 99.4 F, SpO2 95% on room air.  Patient desaturated to 85% while on room air and placed on 2 L O2 via [place] with improvement to >94%.   Labs show WBC 2.7, hemoglobin 11.2, platelets 105,000, sodium 145, potassium 4.1, bicarb 29, BUN 17, creatinine 1.73, serum glucose 152, BNP 351.9.   SARS-CoV-2 PCR is positive.  Influenza and RSV negative.   2 view chest x-ray negative for focal consolidation, edema, effusion.   Patient was given IV Lasix 40 mg and albuterol nebulizer.  The hospitalist service was consulted to admit for further evaluation and management.  Assessment & Plan:   Principal Problem:   Acute respiratory failure with hypoxia (HCC) Active Problems:   COVID-19 virus infection   Chronic heart failure with mildly reduced  ejection fraction (HFmrEF, 41-49%) (HCC)   Type 2 diabetes mellitus (HCC)   Chronic kidney disease, stage 3b (HCC)   Paroxysmal atrial fibrillation (HCC)   Hypertension associated with diabetes (HCC)   Hyperlipidemia associated with type 2 diabetes mellitus (HCC)   Benign prostatic hyperplasia with lower urinary tract symptoms  Acute respiratory failure with hypoxia due to COVID-19 bronchospasm: CXR without evidence of pneumonia or pulmonary edema.  SpO2 as low as 85% while on room air, improved on 2 L O2 via [place].  Received Solu-Medrol PTA. -Still hypoxic and requiring oxygen.  Continue Brovana/Pulmicort twice daily, DuoNebs as needed and prednisone 40 mg p.o. daily.  Wean as able to.  Encouraged incentive spirometry every hour yet again.  We repeated a chest x-ray and there is question of developing airspace disease in the left lung base and right upper lung zone.  Patient afebrile.  Will continue current management for now.  Monitor and repeat CBC in the morning.  Will repeat Pro-Cal tomorrow morning.   Chronic HFmrEF: Last EF 45-50%.  He has some increased lower extremity edema from baseline.  BNP 351 but baseline is unknown.  CXR without evidence of pulmonary edema or effusion. -S/p IV Lasix 40 mg given in the ED, no further Lasix given.  Appears slightly fluid overloaded to me.  Again today, I will diuresing with Lasix 40 mg IV twice daily as this appears to be helping with his hypoxia.  Continue Toprol-XL.  Reassess tomorrow.   Paroxysmal atrial fibrillation: Sinus  rhythm on admission.  Continue Toprol-XL 75 mg daily and Xarelto 15 mg daily.   CKD stage IIIb: Renal function stable.   Pancytopenia: Patient with chronic thrombocytopenia and anemia.  Leukopenia is new, likely secondary to viral infection.  Continue to monitor.   Type 2 diabetes: Placed on SSI.   Hypertension: Continue Toprol-XL.   Hyperlipidemia: Continue atorvastatin.   BPH: Continue Flomax and Proscar.    Diabetic peripheral neuropathy/history of trigeminal neuralgia: Continue gabapentin and Trileptal.  Generalized weakness: Patient now appears to be weaker than 2 days ago and I wonder if he is going to require discharge to SNF.  PT OT is consulted since the morning, still waiting for evaluation.    DVT prophylaxis: Xarelto   Code Status: Full Code  Family Communication:  None present at bedside.  Plan of care discussed with patient in length and he/she verbalized understanding and agreed with it.  Status is: Inpatient Remains inpatient appropriate because: Still hypoxic, needs assessment by PT OT.    Estimated body mass index is 25.83 kg/m as calculated from the following:   Height as of this encounter: 5\' 11"  (1.803 m).   Weight as of this encounter: 84 kg.    Nutritional Assessment: Body mass index is 25.83 kg/m.[name] Seen by dietician.  I agree with the assessment and plan as outlined below: Nutrition Status:        . Skin Assessment: I have examined the patient's skin and I agree with the wound assessment as performed by the wound care RN as outlined below:    Consultants:  None  Procedures:  None  Antimicrobials:  Anti-infectives (From admission, onward)    None         Subjective: Patient seen and examined with the nurse alongside.  Patient was noted to be a mouth breather, while he was sleeping.  When he woke up, he had no shortness of breath or any other complaint.  He wanted to go home.  We discussed about PT evaluation and possibly need to go to SNF.  He was in agreement if that is needed.  Objective: Vitals:   07/26/23 0514 07/26/23 0825 07/26/23 1102 07/26/23 1156  BP: (!) 139/54 (!) 137/57  135/66  Pulse: 71 78  70  Resp: 15     Temp: 98.5 F (36.9 C)   98.6 F (37 C)  TempSrc:      SpO2: 92%  94% 94%  Weight: 84 kg     Height:        Intake/Output Summary (Last 24 hours) at 07/26/2023 1503 Last data filed at 07/26/2023 0900 Gross per 24  hour  Intake 120 ml  Output --  Net 120 ml   Filed Weights   07/23/23 1547 07/26/23 0514  Weight: 81.2 kg 84 kg    Examination: General exam: Appears calm and comfortable  Respiratory system: Rhonchi bilaterally.  Respiratory effort normal. Cardiovascular system: S1 & S2 heard, RRR. No JVD, murmurs, rubs, gallops or clicks. No pedal edema. Gastrointestinal system: Abdomen is nondistended, soft and nontender. No organomegaly or masses felt. Normal bowel sounds heard. Central nervous system: Alert and oriented. No focal neurological deficits. Extremities: Symmetric 5 x 5 power. Skin: No rashes, lesions or ulcers.   Data Reviewed: I have personally reviewed following labs and imaging studies  CBC: Recent Labs  Lab 07/23/23 1601 07/24/23 0555 07/25/23 0541  WBC 2.7* 2.4* 7.1  NEUTROABS  --  2.1 6.0  HGB 11.2* 11.5* 12.4*  HCT 38.2* 39.3  41.5  MCV 86.4 86.6 83.7  PLT 105* 101* 129*   Basic Metabolic Panel: Recent Labs  Lab 07/23/23 1601 07/24/23 0555 07/25/23 0541 07/26/23 0732  NA 145 137 138 138  K 4.1 4.0 4.0 3.6  CL 105 99 95* 93*  CO2 29 27 30  33*  GLUCOSE 152* 181* 139* 133*  BUN 17 24* 45* 55*  CREATININE 1.73* 1.66* 1.84* 1.96*  CALCIUM 8.5* 8.0* 8.3* 8.2*   GFR: Estimated Creatinine Clearance: 29.9 mL/min (A) (by C-G formula based on SCr of 1.96 mg/dL (H)). Liver Function Tests: No results for input(s): "AST", "ALT", "ALKPHOS", "BILITOT", "PROT", "ALBUMIN" in the last 168 hours. No results for input(s): "LIPASE", "AMYLASE" in the last 168 hours. No results for input(s): "AMMONIA" in the last 168 hours. Coagulation Profile: No results for input(s): "INR", "PROTIME" in the last 168 hours. Cardiac Enzymes: No results for input(s): "CKTOTAL", "CKMB", "CKMBINDEX", "TROPONINI" in the last 168 hours. BNP (last 3 results) No results for input(s): "PROBNP" in the last 8760 hours. HbA1C: No results for input(s): "HGBA1C" in the last 72 hours. CBG: Recent  Labs  Lab 07/25/23 1234 07/25/23 1652 07/25/23 2116 07/26/23 0802 07/26/23 1157  GLUCAP 129* 155* 281* 132* 159*   Lipid Profile: No results for input(s): "CHOL", "HDL", "LDLCALC", "TRIG", "CHOLHDL", "LDLDIRECT" in the last 72 hours. Thyroid Function Tests: No results for input(s): "TSH", "T4TOTAL", "FREET4", "T3FREE", "THYROIDAB" in the last 72 hours. Anemia Panel: No results for input(s): "VITAMINB12", "FOLATE", "FERRITIN", "TIBC", "IRON", "RETICCTPCT" in the last 72 hours. Sepsis Labs: Recent Labs  Lab 07/23/23 1610 07/23/23 1844 07/24/23 0555  PROCALCITON  --   --  0.11  LATICACIDVEN 2.0* 2.1*  --     Recent Results (from the past 240 hours)  Resp panel by RT-PCR (RSV, Flu A&B, Covid) Anterior Nasal Swab     Status: Abnormal   Collection Time: 07/23/23  4:04 PM   Specimen: Anterior Nasal Swab  Result Value Ref Range Status   SARS Coronavirus 2 by RT PCR POSITIVE (A) NEGATIVE Final    Comment: (NOTE) SARS-CoV-2 target nucleic acids are DETECTED.  The SARS-CoV-2 RNA is generally detectable in upper respiratory specimens during the acute phase of infection. Positive results are indicative of the presence of the identified virus, but do not rule out bacterial infection or co-infection with other pathogens not detected by the test. Clinical correlation with patient history and other diagnostic information is necessary to determine patient infection status. The expected result is Negative.  Fact Sheet for Patients: BloggerCourse.com  Fact Sheet for Healthcare Providers: SeriousBroker.it  This test is not yet approved or cleared by the [place] FDA and  has been authorized for detection and/or diagnosis of SARS-CoV-2 by FDA under an Emergency Use Authorization (EUA).  This EUA will remain in effect (meaning this test can be used) for the duration of  the COVID-19 declaration under Section 564(b)(1) of the A ct,  21 U.S.C. section 360bbb-3(b)(1), unless the authorization is terminated or revoked sooner.     Influenza A by PCR NEGATIVE NEGATIVE Final   Influenza B by PCR NEGATIVE NEGATIVE Final    Comment: (NOTE) The Xpert Xpress SARS-CoV-2/FLU/RSV plus assay is intended as an aid in the diagnosis of influenza from Nasopharyngeal swab specimens and should not be used as a sole basis for treatment. Nasal washings and aspirates are unacceptable for Xpert Xpress SARS-CoV-2/FLU/RSV testing.  Fact Sheet for Patients: BloggerCourse.com  Fact Sheet for Healthcare Providers: SeriousBroker.it  This test is not  yet approved or cleared by the [place] and has been authorized for detection and/or diagnosis of SARS-CoV-2 by FDA under an Emergency Use Authorization (EUA). This EUA will remain in effect (meaning this test can be used) for the duration of the COVID-19 declaration under Section 564(b)(1) of the Act, 21 U.S.C. section 360bbb-3(b)(1), unless the authorization is terminated or revoked.     Resp Syncytial Virus by PCR NEGATIVE NEGATIVE Final    Comment: (NOTE) Fact Sheet for Patients: BloggerCourse.com  Fact Sheet for Healthcare Providers: SeriousBroker.it  This test is not yet approved or cleared by the [place] FDA and has been authorized for detection and/or diagnosis of SARS-CoV-2 by FDA under an Emergency Use Authorization (EUA). This EUA will remain in effect (meaning this test can be used) for the duration of the COVID-19 declaration under Section 564(b)(1) of the Act, 21 U.S.C. section 360bbb-3(b)(1), unless the authorization is terminated or revoked.  Performed at [facility], [address], [place], [place] [address]      Radiology Studies: DG CHEST PORT 1 VIEW Result Date: 07/26/2023 CLINICAL DATA:  Acute respiratory failure with hypoxia  EXAM: PORTABLE CHEST 1 VIEW COMPARISON:  07/23/2023 FINDINGS: Lung volumes are low. Stable heart size and mediastinal contours. Aortic atherosclerosis. Question of developing opacity in the right upper lung zone and left lung base. No pulmonary edema, pleural effusion or pneumothorax. No pulmonary edema. IMPRESSION: Question of developing airspace disease in the left lung base and right upper lung zone. Electronically Signed   By: Narda Rutherford M.D.   On: 07/26/2023 14:32    Scheduled Meds:  arformoterol  15 mcg Nebulization BID   atorvastatin  40 mg Oral Daily   budesonide (PULMICORT) nebulizer solution  0.25 mg Nebulization BID   feeding supplement  237 mL Oral BID BM   finasteride  5 mg Oral Daily   gabapentin  300 mg Oral BID   guaiFENesin  600 mg Oral BID   insulin aspart  0-15 Units Subcutaneous TID WC   insulin aspart  0-5 Units Subcutaneous QHS   metoprolol succinate  75 mg Oral Daily   OXcarbazepine  75 mg Oral BID   pantoprazole  40 mg Oral Daily   predniSONE  40 mg Oral Q breakfast   Rivaroxaban  15 mg Oral Q supper   sodium chloride flush  3 mL Intravenous Q12H   tamsulosin  0.4 mg Oral BID   Continuous Infusions:   LOS: 2 days   Hughie Closs, MD [facility]  07/26/2023, 3:03 PM   *Please note that this is a verbal dictation therefore any spelling or grammatical errors are due to the "Dragon Medical One" system interpretation.  Please page via Amion and do not message via secure chat for urgent patient care matters. Secure chat can be used for non urgent patient care matters.  How to contact the [facility] Attending or Consulting provider 7A - 7P or covering provider during after hours 7P -7A, for this patient?  Check the care team in [facility] and look for a) attending/consulting TR[facility] provider listed and b) the [facility] team listed. Page or secure chat 7A-7P. Log into [email or web address] and use [place]'s universal password to access. If you do not have the password, please  contact the hospital operator. Locate the [facility] provider you are looking for under [facility] and page to a number that you can be directly reached. If you still have difficulty reaching the provider, please page the [facility] (Director  on Call) for the Hospitalists listed on amion for assistance.

## 2023-07-26 NOTE — Evaluation (Signed)
 Occupational Therapy Evaluation Patient Details Name: Erik [name] MRN: [id number] DOB: Sep 26, 1940 Today's Date: 07/26/2023   History of Present Illness   Patient is a 83 year old male who presented with 3 day history of exertional dyspnea and nonproductive cough. Patient was admitted with acute respiratory failure with hypoxia due to COVID 19 bronchospasm, PMH:  paroxysmal atrial fibrillation, chronic HFmEF, CKD III, DM II, HTN.     Clinical Impressions Patient is a 83 year old male who was admitted for above. Patient was living at home with wife at cane level prior to admission. Currently, patient needs RW for stability with functional mobility with TD support for O2 cord management. Patient is supervision to engage in toileting tasks and grooming standing at sink with no LOB. Patient was noted to have decreased functional activity tolerance, decreased endurance, decreased standing balance, decreased safety awareness, and decreased knowledge of AD/AE impacting participation in ADLs. Plan is for patient to transition home with [facility] and family support when medically stable. Patient would continue to benefit from skilled OT services at this time while admitted and after d/c to address noted deficits in order to improve overall safety and independence in ADLs.       If plan is discharge home, recommend the following:   A little help with walking and/or transfers;A little help with bathing/dressing/bathroom;Assistance with cooking/housework;Direct supervision/assist for medications management;Assist for transportation;Help with stairs or ramp for entrance;Direct supervision/assist for financial management     Functional Status Assessment   Patient has had a recent decline in their functional status and demonstrates the ability to make significant improvements in function in a reasonable and predictable amount of time.     Equipment Recommendations   None recommended by OT       Precautions/Restrictions   Precautions Precautions: Fall Recall of Precautions/Restrictions: Impaired Precaution/Restrictions Comments: monitor O2, Restrictions Weight Bearing Restrictions Per Provider Order: No     Mobility Bed Mobility Overal bed mobility: Needs Assistance Bed Mobility: Supine to Sit     Supine to sit: Supervision, Used rails              Balance Overall balance assessment: Needs assistance Sitting-balance support: Feet supported Sitting balance-Leahy Scale: Fair     Standing balance support: During functional activity Standing balance-Leahy Scale: Fair Standing balance comment: grooming tasks at sink with no UE support.       ADL either performed or assessed with clinical judgement   ADL Overall ADL's : Needs assistance/impaired     Grooming: Wash/dry face;Oral care;Brushing hair;Supervision/safety;Standing Grooming Details (indicate cue type and reason): at sink with no LOB on 2L/min Upper Body Bathing: Sitting;Supervision/ safety   Lower Body Bathing: Sitting/lateral leans;Contact guard assist   Upper Body Dressing : Sitting;Set up;Supervision/safety   Lower Body Dressing: Sit to/from stand;Contact guard assist;Set up Lower Body Dressing Details (indicate cue type and reason): mesh underwear Toilet Transfer: Contact guard assist;Ambulation Toilet Transfer Details (indicate cue type and reason): with personal cane noted to have instability. patient was better with RW to engage in mobility in room. Toileting- Clothing Manipulation and Hygiene: Sit to/from stand;Contact guard assist Toileting - Clothing Manipulation Details (indicate cue type and reason): with increased time.             Vision Baseline Vision/History: 1 Wears glasses              Pertinent Vitals/Pain Pain Assessment Pain Assessment: No/denies pain     Extremity/Trunk Assessment Upper Extremity Assessment Upper Extremity Assessment: Overall  WFL for  tasks assessed   Lower Extremity Assessment Lower Extremity Assessment: Defer to PT evaluation   Cervical / Trunk Assessment Cervical / Trunk Assessment: Normal   Communication Communication Communication: Impaired (HOH) Factors Affecting Communication: Hearing impaired   Cognition Arousal: Alert Behavior During Therapy: Flat affect Cognition: Difficult to assess Difficult to assess due to: Hard of hearing/deaf           OT - Cognition Comments: patient knew he was in the hosptial. did not know day.                 Following commands: Intact                  Home Living Family/patient expects to be discharged to:: Private residence Living Arrangements: Spouse/significant other Available Help at Discharge: Family Type of Home: House Home Access: Level entry;Stairs to enter Entrance Stairs-Number of Steps: 1   Home Layout: One level     Bathroom Shower/Tub: Tub/shower unit         Home Equipment: Cane - single point   Additional Comments: later reported having RW in trunk of car during session.      Prior Functioning/Environment Prior Level of Function : Independent/Modified Independent;Driving                    OT Problem List: Impaired balance (sitting and/or standing);Decreased knowledge of precautions;Decreased knowledge of use of DME or AE;Decreased activity tolerance;Decreased safety awareness   OT Treatment/Interventions: Self-care/ADL training;DME and/or AE instruction;Therapeutic activities;Balance training;Patient/family education;Energy conservation      OT Goals(Current goals can be found in the care plan section)   Acute Rehab OT Goals Patient Stated Goal: to go home OT Goal Formulation: With patient Time For Goal Achievement: 08/09/23 Potential to Achieve Goals: Fair   OT Frequency:  Min 2X/week       AM-PAC OT "6 Clicks" Daily Activity     Outcome Measure Help from another person eating meals?: None Help from  another person taking care of personal grooming?: A Little Help from another person toileting, which includes using toliet, bedpan, or urinal?: A Little Help from another person bathing (including washing, rinsing, drying)?: A Little Help from another person to put on and taking off regular upper body clothing?: A Little Help from another person to put on and taking off regular lower body clothing?: A Little 6 Click Score: 19   End of Session Equipment Utilized During Treatment: Gait belt;Rolling walker (2 wheels) Nurse Communication: Mobility status  Activity Tolerance: Patient tolerated treatment well Patient left: in chair;with call bell/phone within reach;with chair alarm set  OT Visit Diagnosis: Unsteadiness on feet (R26.81);Other abnormalities of gait and mobility (R26.89)                Time: 1429-1450 OT Time Calculation (min): 21 min Charges:  OT General Charges $OT Visit: 1 Visit OT Evaluation $OT Eval Low Complexity: 1 Low  Erik [name] OTR/L, MS Acute Rehabilitation Department Office# [phone]   Selinda Flavin 07/26/2023, 3:01 PM

## 2023-07-26 NOTE — Evaluation (Signed)
 Physical Therapy Evaluation Patient Details Name: Erik [name] MRN: [id number] DOB: [birth year] Today's Date: 07/26/2023  History of Present Illness  Patient is a 83 year old male who presented with 3 day history of exertional dyspnea and nonproductive cough. Patient was admitted with acute respiratory failure with hypoxia due to COVID 19 bronchospasm, PMH:  paroxysmal atrial fibrillation, chronic HFmEF, CKD III, DM II, HTN.  Clinical Impression  Pt tolerated session well. Had a lot of wet coughes and encouraged incentive spirometer and flutter value after our session. Pt was fairly steady on his feet, however recommend rW at this time until he gets his strength back. He was still needing his 2 L O2 during this session, but will continue to assess his oxygen need.         If plan is discharge home, recommend the following: A little help with walking and/or transfers;A little help with bathing/dressing/bathroom;Assist for transportation   Can travel by private vehicle        Equipment Recommendations None recommended by PT  Recommendations for Other Services       Functional Status Assessment Patient has had a recent decline in their functional status and demonstrates the ability to make significant improvements in function in a reasonable and predictable amount of time.     Precautions / Restrictions Precautions Precautions: Fall Recall of Precautions/Restrictions: Impaired Precaution/Restrictions Comments: monitor O2, Restrictions Weight Bearing Restrictions Per Provider Order: No      Mobility  Bed Mobility Overal bed mobility: Needs Assistance Bed Mobility: Supine to Sit     Supine to sit: Supervision, Used rails          Transfers Overall transfer level: Needs assistance Equipment used: Rolling walker (2 wheels), Straight cane Transfers: Sit to/from Stand             General transfer comment: used both the cane and the RW . had improved stability on RW.     Ambulation/Gait Ambulation/Gait assistance: Contact guard assist Gait Distance (Feet): 20 Feet (in room only today) Assistive device: Straight cane, Rolling walker (2 wheels) Gait Pattern/deviations: Step-through pattern, Narrow base of support Gait velocity: slow     General Gait Details: pt walked in room with cane to begin with, but then after tpolieting walked with RW and seemed to have improved stability. At this time use RW until back to his normal baseline.  Stairs            Wheelchair Mobility     Tilt Bed    Modified Rankin (Stroke Patients Only)       Balance Overall balance assessment: Needs assistance Sitting-balance support: Feet supported Sitting balance-Leahy Scale: Fair     Standing balance support: During functional activity Standing balance-Leahy Scale: Fair Standing balance comment: grooming tasks at sink with no UE support.                             Pertinent Vitals/Pain Pain Assessment Pain Assessment: No/denies pain    Home Living Family/patient expects to be discharged to:: Private residence Living Arrangements: Spouse/significant other Available Help at Discharge: Family Type of Home: House Home Access: Level entry;Stairs to enter   Entrance Stairs-Number of Steps: 1   Home Layout: One level Home Equipment: Cane - single Librarian, academic (2 wheels);Rollator (4 wheels) Additional Comments: later reported having RW in trunk of car during session.    Prior Function Prior Level of Function : Independent/Modified Independent;Driving  Mobility Comments: uses cane in house, but times used RW in community       Extremity/Trunk Assessment   Upper Extremity Assessment Upper Extremity Assessment: Overall WFL for tasks assessed    Lower Extremity Assessment Lower Extremity Assessment: Overall WFL for tasks assessed    Cervical / Trunk Assessment Cervical / Trunk Assessment: Normal   Communication   Communication Communication: Impaired (HOH) Factors Affecting Communication: Hearing impaired    Cognition Arousal: Alert Behavior During Therapy: Flat affect                             Following commands: Intact       Cueing       General Comments      Exercises     Assessment/Plan    PT Assessment Patient needs continued PT services  PT Problem List Decreased strength;Decreased range of motion;Decreased mobility;Decreased activity tolerance;Decreased balance       PT Treatment Interventions DME instruction;Gait training;Therapeutic activities;Therapeutic exercise    PT Goals (Current goals can be found in the Care Plan section)  Acute Rehab PT Goals Patient Stated Goal: I feel like I will get better at home verus [facility] PT Goal Formulation: With patient Time For Goal Achievement: 08/09/23 Potential to Achieve Goals: Good    Frequency Min 1X/week     Co-evaluation PT/OT/SLP Co-Evaluation/Treatment: Yes Reason for Co-Treatment: For patient/therapist safety;Other (comment) (MD wanted both assessment as soon as possible for DC) PT goals addressed during session: Mobility/safety with mobility OT goals addressed during session: ADL's and self-care       AM-PAC PT "6 Clicks" Mobility  Outcome Measure Help needed turning from your back to your side while in a flat bed without using bedrails?: A Little Help needed moving from lying on your back to sitting on the side of a flat bed without using bedrails?: A Little Help needed moving to and from a bed to a chair (including a wheelchair)?: A Little Help needed standing up from a chair using your arms (e.g., wheelchair or bedside chair)?: A Little Help needed to walk in hospital room?: A Little Help needed climbing 3-5 steps with a railing? : A Little 6 Click Score: 18    End of Session Equipment Utilized During Treatment: Gait belt Activity Tolerance: Patient tolerated treatment  well Patient left: in chair;with call bell/phone within reach;with chair alarm set;with nursing/sitter in room Nurse Communication: Mobility status PT Visit Diagnosis: Repeated falls (R29.6);Unsteadiness on feet (R26.81)    Time: 1430-1455 PT Time Calculation (min) (ACUTE ONLY): 25 min   Charges:   PT Evaluation $PT Eval Low Complexity: 1 Low PT Treatments $Gait Training: 8-22 mins PT General Charges $$ ACUTE PT VISIT: 1 Visit         Erik [name], PT, MPT Acute Rehabilitation Services Office: [phone] If a weekend: secure chat groups: [facility] PT, [facility] OT, [facility] SLP 07/26/2023   Hye Trawick 07/26/2023, 5:00 PM

## 2023-07-26 NOTE — Plan of Care (Signed)
 No acute events overnight. Problem: Education: Goal: Knowledge of risk factors and measures for prevention of condition will improve Outcome: Progressing   Problem: Coping: Goal: Psychosocial and spiritual needs will be supported Outcome: Progressing   Problem: Respiratory: Goal: Will maintain a patent airway Outcome: Progressing Goal: Complications related to the disease process, condition or treatment will be avoided or minimized Outcome: Progressing   Problem: Education: Goal: Ability to describe self-care measures that may prevent or decrease complications (Diabetes Survival Skills Education) will improve Outcome: Progressing Goal: Individualized Educational Video(s) Outcome: Progressing   Problem: Coping: Goal: Ability to adjust to condition or change in health will improve Outcome: Progressing   Problem: Fluid Volume: Goal: Ability to maintain a balanced intake and output will improve Outcome: Progressing   Problem: Health Behavior/Discharge Planning: Goal: Ability to identify and utilize available resources and services will improve Outcome: Progressing Goal: Ability to manage health-related needs will improve Outcome: Progressing   Problem: Metabolic: Goal: Ability to maintain appropriate glucose levels will improve Outcome: Progressing   Problem: Nutritional: Goal: Maintenance of adequate nutrition will improve Outcome: Progressing Goal: Progress toward achieving an optimal weight will improve Outcome: Progressing   Problem: Skin Integrity: Goal: Risk for impaired skin integrity will decrease Outcome: Progressing   Problem: Tissue Perfusion: Goal: Adequacy of tissue perfusion will improve Outcome: Progressing   Problem: Education: Goal: Knowledge of General Education information will improve Description: Including pain rating scale, medication(s)/side effects and non-pharmacologic comfort measures Outcome: Progressing   Problem: Health  Behavior/Discharge Planning: Goal: Ability to manage health-related needs will improve Outcome: Progressing   Problem: Clinical Measurements: Goal: Ability to maintain clinical measurements within normal limits will improve Outcome: Progressing Goal: Will remain free from infection Outcome: Progressing Goal: Diagnostic test results will improve Outcome: Progressing Goal: Respiratory complications will improve Outcome: Progressing Goal: Cardiovascular complication will be avoided Outcome: Progressing   Problem: Activity: Goal: Risk for activity intolerance will decrease Outcome: Progressing   Problem: Nutrition: Goal: Adequate nutrition will be maintained Outcome: Progressing   Problem: Coping: Goal: Level of anxiety will decrease Outcome: Progressing   Problem: Elimination: Goal: Will not experience complications related to bowel motility Outcome: Progressing Goal: Will not experience complications related to urinary retention Outcome: Progressing   Problem: Pain Managment: Goal: General experience of comfort will improve and/or be controlled Outcome: Progressing   Problem: Safety: Goal: Ability to remain free from injury will improve Outcome: Progressing   Problem: Skin Integrity: Goal: Risk for impaired skin integrity will decrease Outcome: Progressing

## 2023-07-27 DIAGNOSIS — J9601 Acute respiratory failure with hypoxia: Secondary | ICD-10-CM | POA: Diagnosis not present

## 2023-07-27 LAB — CBC WITH DIFFERENTIAL/PLATELET
Abs Immature Granulocytes: 0.03 10*3/uL (ref 0.00–0.07)
Basophils Absolute: 0 10*3/uL (ref 0.0–0.1)
Basophils Relative: 0 %
Eosinophils Absolute: 0 10*3/uL (ref 0.0–0.5)
Eosinophils Relative: 0 %
HCT: 41.6 % (ref 39.0–52.0)
Hemoglobin: 12.6 g/dL — ABNORMAL LOW (ref 13.0–17.0)
Immature Granulocytes: 0 %
Lymphocytes Relative: 10 %
Lymphs Abs: 0.7 10*3/uL (ref 0.7–4.0)
MCH: 24.9 pg — ABNORMAL LOW (ref 26.0–34.0)
MCHC: 30.3 g/dL (ref 30.0–36.0)
MCV: 82.2 fL (ref 80.0–100.0)
Monocytes Absolute: 0.7 10*3/uL (ref 0.1–1.0)
Monocytes Relative: 9 %
Neutro Abs: 6.1 10*3/uL (ref 1.7–7.7)
Neutrophils Relative %: 81 %
Platelets: 138 10*3/uL — ABNORMAL LOW (ref 150–400)
RBC: 5.06 MIL/uL (ref 4.22–5.81)
RDW: 15.9 % — ABNORMAL HIGH (ref 11.5–15.5)
WBC: 7.6 10*3/uL (ref 4.0–10.5)
nRBC: 0 % (ref 0.0–0.2)

## 2023-07-27 LAB — GLUCOSE, CAPILLARY
Glucose-Capillary: 121 mg/dL — ABNORMAL HIGH (ref 70–99)
Glucose-Capillary: 205 mg/dL — ABNORMAL HIGH (ref 70–99)
Glucose-Capillary: 234 mg/dL — ABNORMAL HIGH (ref 70–99)
Glucose-Capillary: 317 mg/dL — ABNORMAL HIGH (ref 70–99)

## 2023-07-27 LAB — BASIC METABOLIC PANEL
Anion gap: 13 (ref 5–15)
BUN: 69 mg/dL — ABNORMAL HIGH (ref 8–23)
CO2: 31 mmol/L (ref 22–32)
Calcium: 7.8 mg/dL — ABNORMAL LOW (ref 8.9–10.3)
Chloride: 89 mmol/L — ABNORMAL LOW (ref 98–111)
Creatinine, Ser: 2.3 mg/dL — ABNORMAL HIGH (ref 0.61–1.24)
GFR, Estimated: 27 mL/min — ABNORMAL LOW (ref >60)
Glucose, Bld: 151 mg/dL — ABNORMAL HIGH (ref 70–99)
Potassium: 3.9 mmol/L (ref 3.5–5.1)
Sodium: 133 mmol/L — ABNORMAL LOW (ref 135–145)

## 2023-07-27 LAB — PROCALCITONIN: Procalcitonin: 0.19 ng/mL

## 2023-07-27 MED ORDER — FUROSEMIDE 40 MG PO TABS
40.0000 mg | ORAL_TABLET | Freq: Once | ORAL | Status: AC
  Administered 2023-07-27: 40 mg via ORAL
  Filled 2023-07-27: qty 1

## 2023-07-27 MED ORDER — FUROSEMIDE 10 MG/ML IJ SOLN: 40.0000 mg | Freq: Four times a day (QID) | INTRAMUSCULAR | Status: DC

## 2023-07-27 NOTE — Progress Notes (Signed)
SATURATION QUALIFICATIONS: (This note is used to comply with regulatory documentation for home oxygen)  Patient Saturations on Room Air at Rest = 92%  Patient Saturations on Room Air while Ambulating = 88%  Patient Saturations on 2 Liters of oxygen while Ambulating = 95%  Please briefly explain why patient needs home oxygen: 

## 2023-07-27 NOTE — Plan of Care (Signed)

## 2023-07-27 NOTE — Progress Notes (Signed)
 PROGRESS NOTE    Erik [name]  ZOX:[id number] DOB: 1941/05/20 DOA: 07/23/2023 PCP: Gaspar Garbe, MD   Brief Narrative:  HPI: Erik [name] is a 83 y.o. male with medical history significant for chronic HFmrEF, PAF on Xarelto, CKD stage IIIb, T2DM, HTN, chronic thrombocytopenia, HLD, BPH who presents to the ED for evaluation of shortness of breath.   Patient reports nonproductive cough and exertional dyspnea for the last 3 days.  He was seen by his PCP in office earlier today and noted to be hypoxic to 85% while on room air.  Both he and his wife tested positive for COVID-19.  He was given Solu-Medrol and DuoNeb treatment in the office with some improvement and subsequently sent to the ED for further evaluation and management.   Patient states that dyspnea improves with rest.  He reports chronic bilateral lower extremity edema which is somewhat increased from baseline.  He has been following with wound care for management of a chronic right plantar heel ulcer which has been healing well.   ED Course  Labs/Imaging on admission: I have personally reviewed following labs and imaging studies.   Initial vitals showed BP 123/99, pulse 102, RR 20, temp 99.4 F, SpO2 95% on room air.  Patient desaturated to 85% while on room air and placed on 2 L O2 via [place] with improvement to >94%.   Labs show WBC 2.7, hemoglobin 11.2, platelets 105,000, sodium 145, potassium 4.1, bicarb 29, BUN 17, creatinine 1.73, serum glucose 152, BNP 351.9.   SARS-CoV-2 PCR is positive.  Influenza and RSV negative.   2 view chest x-ray negative for focal consolidation, edema, effusion.   Patient was given IV Lasix 40 mg and albuterol nebulizer.  The hospitalist service was consulted to admit for further evaluation and management.  Assessment & Plan:   Principal Problem:   Acute respiratory failure with hypoxia (HCC) Active Problems:   COVID-19 virus infection   Chronic heart failure with mildly reduced  ejection fraction (HFmrEF, 41-49%) (HCC)   Type 2 diabetes mellitus (HCC)   Chronic kidney disease, stage 3b (HCC)   Paroxysmal atrial fibrillation (HCC)   Hypertension associated with diabetes (HCC)   Hyperlipidemia associated with type 2 diabetes mellitus (HCC)   Benign prostatic hyperplasia with lower urinary tract symptoms  Acute respiratory failure with hypoxia due to COVID-19 bronchospasm: CXR without evidence of pneumonia or pulmonary edema.  SpO2 as low as 85% while on room air, improved on 2 L O2 via [place].  Received Solu-Medrol PTA. -Still hypoxic and requiring oxygen.  Continue Brovana/Pulmicort twice daily, DuoNebs as needed and prednisone 40 mg p.o. daily. Encouraged incentive spirometry to use every hour, every day yet he is using only 4-5 times a day.  We repeated a chest x-ray and there is question of developing airspace disease in the left lung base and right upper lung zone.  Patient afebrile with almost no significant change in procalcitonin.  Still doubt any bacterial superinfection.  Will treat supportively as we have been doing.  Continue oxygen as able to.  His hypoxia is gradually improving.  Today he dropped to only 88% on room air with exertion which is better than yesterday.   Chronic HFmrEF: Last EF 45-50%.  He has some increased lower extremity edema from baseline.  BNP 351 but baseline is unknown.  CXR without evidence of pulmonary edema or effusion. -S/p IV Lasix 40 mg given in the ED, has been getting IV Lasix 40 mg twice daily for  last 2 to 3 days.  Still with crackles but improved.  Still slightly fluid overloaded.  However due to rising creatinine, we are restricted with further aggressive diuresis so I will only give him 1 dose of Lasix p.o. 40 mg today. Continue Toprol-XL.  Reassess tomorrow.   Paroxysmal atrial fibrillation: Sinus rhythm on admission.  Continue Toprol-XL 75 mg daily and Xarelto 15 mg daily.   AKI on CKD stage IIIb: Baseline creatinine between  1.5-1.7 renal function stable.Creatinine jumped today to 2.3.  He is not on any nephrotoxic agents.  Will de-escalate diuretics and only provide with 1 dose of Lasix today.   Pancytopenia: Patient with chronic thrombocytopenia and anemia.  Leukopenia is new, likely secondary to viral infection which has improved.  Continue to monitor.   Type 2 diabetes: Placed on SSI.  Blood sugar labile but improving compared to yesterday so will not make any changes.   Hypertension: Continue Toprol-XL.   Hyperlipidemia: Continue atorvastatin.   BPH: Continue Flomax and Proscar.   Diabetic peripheral neuropathy/history of trigeminal neuralgia: Continue gabapentin and Trileptal.  Generalized weakness: Evaluated by PT OT twice and they have recommended home health PT for him.    DVT prophylaxis: Xarelto   Code Status: Full Code  Family Communication:  None present at bedside.  Plan of care discussed with patient in length and he/she verbalized understanding and agreed with it.  Status is: Inpatient Remains inpatient appropriate because: Still hypoxic, rising creatinine.    Estimated body mass index is 23.71 kg/m as calculated from the following:   Height as of this encounter: 5\' 11"  (1.803 m).   Weight as of this encounter: 77.1 kg.    Nutritional Assessment: Body mass index is 23.71 kg/m.[name] Seen by dietician.  I agree with the assessment and plan as outlined below: Nutrition Status:        . Skin Assessment: I have examined the patient's skin and I agree with the wound assessment as performed by the wound care RN as outlined below:    Consultants:  None  Procedures:  None  Antimicrobials:  Anti-infectives (From admission, onward)    None         Subjective: Patient seen and examined, much more awake today than yesterday.  Has no complaints.  Objective: Vitals:   07/26/23 2037 07/26/23 2130 07/27/23 0125 07/27/23 0622  BP: (!) 127/43   138/72  Pulse: (!) 51   79   Resp: 18   18  Temp: 98 F (36.7 C)   99.4 F (37.4 C)  TempSrc:      SpO2: 94% 96%  93%  Weight:   77.1 kg   Height:        Intake/Output Summary (Last 24 hours) at 07/27/2023 1115 Last data filed at 07/27/2023 0400 Gross per 24 hour  Intake 780 ml  Output 390 ml  Net 390 ml   Filed Weights   07/23/23 1547 07/26/23 0514 07/27/23 0125  Weight: 81.2 kg 84 kg 77.1 kg    Examination:  General exam: Appears calm and comfortable  Respiratory system: Faint crackles and rhonchi at the bases bilaterally.  Respiratory effort normal. Cardiovascular system: S1 & S2 heard, RRR. No JVD, murmurs, rubs, gallops or clicks. No pedal edema. Gastrointestinal system: Abdomen is nondistended, soft and nontender. No organomegaly or masses felt. Normal bowel sounds heard. Central nervous system: Alert and oriented. No focal neurological deficits. Extremities: Symmetric 5 x 5 power. Skin: No rashes, lesions or ulcers.  Psychiatry: Judgement and  insight appear normal. Mood & affect appropriate.   Data Reviewed: I have personally reviewed following labs and imaging studies  CBC: Recent Labs  Lab 07/23/23 1601 07/24/23 0555 07/25/23 0541 07/27/23 0920  WBC 2.7* 2.4* 7.1 7.6  NEUTROABS  --  2.1 6.0 6.1  HGB 11.2* 11.5* 12.4* 12.6*  HCT 38.2* 39.3 41.5 41.6  MCV 86.4 86.6 83.7 82.2  PLT 105* 101* 129* 138*   Basic Metabolic Panel: Recent Labs  Lab 07/23/23 1601 07/24/23 0555 07/25/23 0541 07/26/23 0732 07/27/23 0920  NA 145 137 138 138 133*  K 4.1 4.0 4.0 3.6 3.9  CL 105 99 95* 93* 89*  CO2 29 27 30  33* 31  GLUCOSE 152* 181* 139* 133* 151*  BUN 17 24* 45* 55* 69*  CREATININE 1.73* 1.66* 1.84* 1.96* 2.30*  CALCIUM 8.5* 8.0* 8.3* 8.2* 7.8*   GFR: Estimated Creatinine Clearance: 25.5 mL/min (A) (by C-G formula based on SCr of 2.3 mg/dL (H)). Liver Function Tests: No results for input(s): "AST", "ALT", "ALKPHOS", "BILITOT", "PROT", "ALBUMIN" in the last 168 hours. No results for  input(s): "LIPASE", "AMYLASE" in the last 168 hours. No results for input(s): "AMMONIA" in the last 168 hours. Coagulation Profile: No results for input(s): "INR", "PROTIME" in the last 168 hours. Cardiac Enzymes: No results for input(s): "CKTOTAL", "CKMB", "CKMBINDEX", "TROPONINI" in the last 168 hours. BNP (last 3 results) No results for input(s): "PROBNP" in the last 8760 hours. HbA1C: No results for input(s): "HGBA1C" in the last 72 hours. CBG: Recent Labs  Lab 07/26/23 0802 07/26/23 1157 07/26/23 1640 07/26/23 2034 07/27/23 0828  GLUCAP 132* 159* 269* 337* 121*   Lipid Profile: No results for input(s): "CHOL", "HDL", "LDLCALC", "TRIG", "CHOLHDL", "LDLDIRECT" in the last 72 hours. Thyroid Function Tests: No results for input(s): "TSH", "T4TOTAL", "FREET4", "T3FREE", "THYROIDAB" in the last 72 hours. Anemia Panel: No results for input(s): "VITAMINB12", "FOLATE", "FERRITIN", "TIBC", "IRON", "RETICCTPCT" in the last 72 hours. Sepsis Labs: Recent Labs  Lab 07/23/23 1610 07/23/23 1844 07/24/23 0555 07/27/23 0920  PROCALCITON  --   --  0.11 0.19  LATICACIDVEN 2.0* 2.1*  --   --     Recent Results (from the past 240 hours)  Resp panel by RT-PCR (RSV, Flu A&B, Covid) Anterior Nasal Swab     Status: Abnormal   Collection Time: 07/23/23  4:04 PM   Specimen: Anterior Nasal Swab  Result Value Ref Range Status   SARS Coronavirus 2 by RT PCR POSITIVE (A) NEGATIVE Final    Comment: (NOTE) SARS-CoV-2 target nucleic acids are DETECTED.  The SARS-CoV-2 RNA is generally detectable in upper respiratory specimens during the acute phase of infection. Positive results are indicative of the presence of the identified virus, but do not rule out bacterial infection or co-infection with other pathogens not detected by the test. Clinical correlation with patient history and other diagnostic information is necessary to determine patient infection status. The expected result is  Negative.  Fact Sheet for Patients: BloggerCourse.com  Fact Sheet for Healthcare Providers: SeriousBroker.it  This test is not yet approved or cleared by the [place] FDA and  has been authorized for detection and/or diagnosis of SARS-CoV-2 by FDA under an Emergency Use Authorization (EUA).  This EUA will remain in effect (meaning this test can be used) for the duration of  the COVID-19 declaration under Section 564(b)(1) of the A ct, 21 U.S.C. section 360bbb-3(b)(1), unless the authorization is terminated or revoked sooner.     Influenza A by PCR  NEGATIVE NEGATIVE Final   Influenza B by PCR NEGATIVE NEGATIVE Final    Comment: (NOTE) The Xpert Xpress SARS-CoV-2/FLU/RSV plus assay is intended as an aid in the diagnosis of influenza from Nasopharyngeal swab specimens and should not be used as a sole basis for treatment. Nasal washings and aspirates are unacceptable for Xpert Xpress SARS-CoV-2/FLU/RSV testing.  Fact Sheet for Patients: BloggerCourse.com  Fact Sheet for Healthcare Providers: SeriousBroker.it  This test is not yet approved or cleared by the [place] FDA and has been authorized for detection and/or diagnosis of SARS-CoV-2 by FDA under an Emergency Use Authorization (EUA). This EUA will remain in effect (meaning this test can be used) for the duration of the COVID-19 declaration under Section 564(b)(1) of the Act, 21 U.S.C. section 360bbb-3(b)(1), unless the authorization is terminated or revoked.     Resp Syncytial Virus by PCR NEGATIVE NEGATIVE Final    Comment: (NOTE) Fact Sheet for Patients: BloggerCourse.com  Fact Sheet for Healthcare Providers: SeriousBroker.it  This test is not yet approved or cleared by the [place] FDA and has been authorized for detection and/or diagnosis of SARS-CoV-2  by FDA under an Emergency Use Authorization (EUA). This EUA will remain in effect (meaning this test can be used) for the duration of the COVID-19 declaration under Section 564(b)(1) of the Act, 21 U.S.C. section 360bbb-3(b)(1), unless the authorization is terminated or revoked.  Performed at [facility], [address] [place], [place] [address]      Radiology Studies: DG CHEST PORT 1 VIEW Result Date: 07/26/2023 CLINICAL DATA:  Acute respiratory failure with hypoxia EXAM: PORTABLE CHEST 1 VIEW COMPARISON:  07/23/2023 FINDINGS: Lung volumes are low. Stable heart size and mediastinal contours. Aortic atherosclerosis. Question of developing opacity in the right upper lung zone and left lung base. No pulmonary edema, pleural effusion or pneumothorax. No pulmonary edema. IMPRESSION: Question of developing airspace disease in the left lung base and right upper lung zone. Electronically Signed   By: Narda Rutherford M.D.   On: 07/26/2023 14:32    Scheduled Meds:  arformoterol  15 mcg Nebulization BID   atorvastatin  40 mg Oral Daily   budesonide (PULMICORT) nebulizer solution  0.25 mg Nebulization BID   feeding supplement  237 mL Oral BID BM   finasteride  5 mg Oral Daily   furosemide  40 mg Oral Once   gabapentin  300 mg Oral BID   guaiFENesin  600 mg Oral BID   insulin aspart  0-15 Units Subcutaneous TID WC   insulin aspart  0-5 Units Subcutaneous QHS   metoprolol succinate  75 mg Oral Daily   OXcarbazepine  75 mg Oral BID   pantoprazole  40 mg Oral Daily   predniSONE  40 mg Oral Q breakfast   Rivaroxaban  15 mg Oral Q supper   sodium chloride flush  3 mL Intravenous Q12H   tamsulosin  0.4 mg Oral BID   Continuous Infusions:   LOS: 3 days   Hughie Closs, MD [facility]  07/27/2023, 11:15 AM   *Please note that this is a verbal dictation therefore any spelling or grammatical errors are due to the "Dragon Medical One" system interpretation.  Please  page via Amion and do not message via secure chat for urgent patient care matters. Secure chat can be used for non urgent patient care matters.  How to contact the [facility] Attending or Consulting provider 7A - 7P or covering provider during after hours 7P -7A, for this  patient?  Check the care team in [facility] and look for a) attending/consulting [facility] provider listed and b) the [facility] team listed. Page or secure chat 7A-7P. Log into [email or web address] and use [place]'s universal password to access. If you do not have the password, please contact the hospital operator. Locate the [facility] provider you are looking for under [facility] and page to a number that you can be directly reached. If you still have difficulty reaching the provider, please page the [facility] (Director on Call) for the Hospitalists listed on amion for assistance.

## 2023-07-28 ENCOUNTER — Other Ambulatory Visit (HOSPITAL_COMMUNITY): Payer: Self-pay

## 2023-07-28 DIAGNOSIS — J9601 Acute respiratory failure with hypoxia: Secondary | ICD-10-CM | POA: Diagnosis not present

## 2023-07-28 LAB — CBC WITH DIFFERENTIAL/PLATELET
Abs Immature Granulocytes: 0.02 10*3/uL (ref 0.00–0.07)
Basophils Absolute: 0 10*3/uL (ref 0.0–0.1)
Basophils Relative: 0 %
Eosinophils Absolute: 0 10*3/uL (ref 0.0–0.5)
Eosinophils Relative: 0 %
HCT: 37.6 % — ABNORMAL LOW (ref 39.0–52.0)
Hemoglobin: 11.5 g/dL — ABNORMAL LOW (ref 13.0–17.0)
Immature Granulocytes: 0 %
Lymphocytes Relative: 11 %
Lymphs Abs: 0.8 10*3/uL (ref 0.7–4.0)
MCH: 25.6 pg — ABNORMAL LOW (ref 26.0–34.0)
MCHC: 30.6 g/dL (ref 30.0–36.0)
MCV: 83.7 fL (ref 80.0–100.0)
Monocytes Absolute: 0.6 10*3/uL (ref 0.1–1.0)
Monocytes Relative: 9 %
Neutro Abs: 5.2 10*3/uL (ref 1.7–7.7)
Neutrophils Relative %: 80 %
Platelets: 123 10*3/uL — ABNORMAL LOW (ref 150–400)
RBC: 4.49 MIL/uL (ref 4.22–5.81)
RDW: 15.5 % (ref 11.5–15.5)
WBC: 6.6 10*3/uL (ref 4.0–10.5)
nRBC: 0 % (ref 0.0–0.2)

## 2023-07-28 LAB — GLUCOSE, CAPILLARY: Glucose-Capillary: 160 mg/dL — ABNORMAL HIGH (ref 70–99)

## 2023-07-28 LAB — BASIC METABOLIC PANEL
Anion gap: 10 (ref 5–15)
BUN: 75 mg/dL — ABNORMAL HIGH (ref 8–23)
CO2: 32 mmol/L (ref 22–32)
Calcium: 7.6 mg/dL — ABNORMAL LOW (ref 8.9–10.3)
Chloride: 91 mmol/L — ABNORMAL LOW (ref 98–111)
Creatinine, Ser: 2.09 mg/dL — ABNORMAL HIGH (ref 0.61–1.24)
GFR, Estimated: 31 mL/min — ABNORMAL LOW (ref >60)
Glucose, Bld: 169 mg/dL — ABNORMAL HIGH (ref 70–99)
Potassium: 3.7 mmol/L (ref 3.5–5.1)
Sodium: 133 mmol/L — ABNORMAL LOW (ref 135–145)

## 2023-07-28 MED ORDER — PREDNISONE 20 MG PO TABS
40.0000 mg | ORAL_TABLET | Freq: Every day | ORAL | 0 refills | Status: AC
  Filled 2023-07-28: qty 8, 4d supply, fill #0

## 2023-07-28 NOTE — Plan of Care (Signed)
   Problem: Education: Goal: Knowledge of risk factors and measures for prevention of condition will improve Outcome: Progressing   Problem: Coping: Goal: Psychosocial and spiritual needs will be supported Outcome: Progressing   Problem: Respiratory: Goal: Will maintain a patent airway Outcome: Progressing

## 2023-07-28 NOTE — Progress Notes (Signed)
 SATURATION QUALIFICATIONS: (This note is used to comply with regulatory documentation for home oxygen)  Patient Saturations on Room Air at Rest = 92%  Patient Saturations on Room Air while Ambulating = 89%  Patient Saturations on 2 Liters of oxygen while Ambulating = 95%  Patient does not qualify for home oxygen, will discharge patient with incentive spirometer per Hughie Closs MD.

## 2023-07-28 NOTE — Progress Notes (Signed)

## 2023-07-28 NOTE — Discharge Summary (Signed)
 Physician Discharge Summary  Erik [name] WUJ:[id number] DOB: 10/26/1940 DOA: 07/23/2023  PCP: Gaspar Garbe, MD  Admit date: 07/23/2023 Discharge date: 07/28/2023 30 Day Unplanned Readmission Risk Score    Flowsheet Row ED to Hosp-Admission (Current) from 07/23/2023 in [facility]  30 Day Unplanned Readmission Risk Score (%) 20.52 Filed at 07/28/2023 0801       This score is the patient's risk of an unplanned readmission within 30 days of being discharged (0 -100%). The score is based on dignosis, age, lab data, medications, orders, and past utilization.   Low:  0-14.9   Medium: 15-21.9   High: 22-29.9   Extreme: 30 and above          Admitted From: Home Disposition: Home  Recommendations for Outpatient Follow-up:  Follow up with PCP in 1-2 weeks Please obtain BMP/CBC in one week Please follow up with your PCP on the following pending results: Unresulted Labs (From admission, onward)    None         Home Health: Yes Equipment/Devices: None  Discharge Condition: Stable CODE STATUS: Full code Diet recommendation: Cardiac  Subjective: Seen examined, denies any shortness of breath or any other complaint.  Desires to go home.  Brief/Interim Summary: Erik [name] is a 83 y.o. 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 He was seen by his PCP in office earlier on the day of presentation and noted to be hypoxic to 85% while on room air.  Both he and his wife tested positive for COVID-19.  He was given Solu-Medrol and DuoNeb treatment in the office with some improvement and subsequently sent to the ED for further evaluation and management and eventually admitted under hospitalist service for further management.  Details below.   Acute  respiratory failure with hypoxia due to COVID-19 bronchospasm: CXR without evidence of pneumonia or pulmonary edema.  SpO2 as low as 85% while on room air, improved on 2 L O2 via [place]. Continued on Brovana/Pulmicort twice daily, DuoNebs as needed and prednisone 40 mg p.o. daily. Encouraged incentive spirometry to use every hour, every day yet he is using only 4-5 times a day.  We repeated a chest x-ray on 07/26/2023 and there is question of developing airspace disease in the left lung base and right upper lung zone.  Patient afebrile with almost no significant change in procalcitonin.  Still doubt any bacterial superinfection so continue to treat supportively and give him a few doses of Lasix which helped as well.  His hypoxia has improved, with ambulation today, his oxygen dropped to only 89% and he did not meet criteria for home oxygen.  He has been discharged home today.  Completed almost 6 days of oral prednisone, will prescribe 4 more days to complete 10 days.   Chronic HFmrEF: Last EF 45-50%.  He has some increased lower extremity edema from baseline.  BNP 351 but baseline is unknown.  CXR without evidence of pulmonary edema or effusion. -S/p IV Lasix 40 mg given in the ED, he was given IV Lasix 40 mg twice daily for last 2 to 3 days last dose on 07/26/2023, his creatinine jumped slightly so he received only 1 dose of Lasix which was oral 40 mg yesterday.  Lungs sounding much better.   Paroxysmal atrial fibrillation:  Sinus rhythm on admission.  Continue Toprol-XL 75 mg daily and Xarelto 15 mg daily.   AKI on CKD stage IIIb: Baseline creatinine between 1.5-1.7 renal function stable.Creatinine jumped to 2.3 yesterday but improved to 2.0 today which is not far from his baseline.   Pancytopenia: Patient with chronic thrombocytopenia and anemia.  Leukopenia is new, likely secondary to viral infection which has improved.  Continue to monitor.   Type 2 diabetes: Placed on SSI.  He is not on any medications  PTA.   Hypertension: Continue Toprol-XL.   Hyperlipidemia: Continue atorvastatin.   BPH: Continue Flomax and Proscar.   Diabetic peripheral neuropathy/history of trigeminal neuralgia: Continue gabapentin and Trileptal.   Generalized weakness: Evaluated by PT OT twice and they have recommended home health PT for him.  Discharge plan was discussed with patient and/or family member and they verbalized understanding and agreed with it.  Discharge Diagnoses:  Principal Problem:   Acute respiratory failure with hypoxia (HCC) Active Problems:   COVID-19 virus infection   Chronic heart failure with mildly reduced ejection fraction (HFmrEF, 41-49%) (HCC)   Type 2 diabetes mellitus (HCC)   Chronic kidney disease, stage 3b (HCC)   Paroxysmal atrial fibrillation (HCC)   Hypertension associated with diabetes (HCC)   Hyperlipidemia associated with type 2 diabetes mellitus (HCC)   Benign prostatic hyperplasia with lower urinary tract symptoms    Discharge Instructions   Allergies as of 07/28/2023       Reactions   Claritin [loratadine] Other (See Comments)   Urinary retention   Tradjenta [linagliptin] Other (See Comments)   High blood sugar   Imipramine Other (See Comments)   Other reaction(s): hallucination/dizziness        Medication List     TAKE these medications    acetaminophen 325 MG tablet Commonly known as: TYLENOL Take 2 tablets (650 mg total) by mouth every 6 (six) hours as needed for mild pain (or Fever >/= 101).   atorvastatin 40 MG tablet Commonly known as: LIPITOR Take 40 mg by mouth daily.   CoQ10 200 MG Caps Take 100 mg by mouth daily.   finasteride 5 MG tablet Commonly known as: PROSCAR Take 5 mg by mouth daily.   gabapentin 300 MG capsule Commonly known as: NEURONTIN Take 1 capsule (300 mg total) by mouth 2 (two) times daily.   metoprolol succinate 50 MG 24 hr tablet Commonly known as: Toprol XL Take 1 tablet (50 mg total) by mouth daily.  Take with or immediately following a meal.   multivitamin capsule Take 1 capsule by mouth daily.   OXcarbazepine 150 MG tablet Commonly known as: TRILEPTAL Take 0.5 tablets (75 mg total) by mouth 2 (two) times daily.   pantoprazole 40 MG tablet Commonly known as: PROTONIX Take 40 mg by mouth daily.   predniSONE 20 MG tablet Commonly known as: DELTASONE Take 2 tablets (40 mg total) by mouth daily with breakfast for 4 days. Start taking on: July 29, 2023   Rivaroxaban 15 MG Tabs tablet Commonly known as: XARELTO Take 1 tablet (15 mg total) by mouth daily with supper.   selenium 200 MCG Tabs tablet Take 200 mcg by mouth daily.   tamsulosin 0.4 MG Caps capsule Commonly known as: FLOMAX Take 0.4 mg by mouth in the morning and at bedtime.   Toujeo SoloStar 300 UNIT/ML Solostar Pen Generic drug: insulin glargine (1 Unit Dial) Inject 36 Units as directed daily.        Allergies  Allergen Reactions  Claritin [Loratadine] Other (See Comments)    Urinary retention   Tradjenta [Linagliptin] Other (See Comments)    High blood sugar   Imipramine Other (See Comments)    Other reaction(s): hallucination/dizziness    Consultations: None   Procedures/Studies: DG CHEST PORT 1 VIEW Result Date: 07/26/2023 CLINICAL DATA:  Acute respiratory failure with hypoxia EXAM: PORTABLE CHEST 1 VIEW COMPARISON:  07/23/2023 FINDINGS: Lung volumes are low. Stable heart size and mediastinal contours. Aortic atherosclerosis. Question of developing opacity in the right upper lung zone and left lung base. No pulmonary edema, pleural effusion or pneumothorax. No pulmonary edema. IMPRESSION: Question of developing airspace disease in the left lung base and right upper lung zone. Electronically Signed   By: Narda Rutherford M.D.   On: 07/26/2023 14:32   DG Chest 2 View Result Date: 07/23/2023 CLINICAL DATA:  Shortness of breath. EXAM: CHEST - 2 VIEW COMPARISON:  Oct 03, 2020. FINDINGS: The heart size  and mediastinal contours are within normal limits. Both lungs are clear. Status post left shoulder arthroplasty. IMPRESSION: No active cardiopulmonary disease. Electronically Signed   By: Lupita Raider M.D.   On: 07/23/2023 17:33     Discharge Exam: Vitals:   07/28/23 0831 07/28/23 0833  BP:    Pulse:    Resp:    Temp:    SpO2: 94% 94%   Vitals:   07/27/23 2014 07/28/23 0656 07/28/23 0831 07/28/23 0833  BP: 125/61 132/65    Pulse: 62 61    Resp: 20 18    Temp: 98.2 F (36.8 C) 98.3 F (36.8 C)    TempSrc: Oral Oral    SpO2: 99% 95% 94% 94%  Weight:      Height:        General: Pt is alert, awake, not in acute distress Cardiovascular: RRR, S1/S2 +, no rubs, no gallops Respiratory: Very faint rhonchi, significantly improved.  No wheezes or crackles today. Abdominal: Soft, NT, ND, bowel sounds + Extremities: no edema, no cyanosis    The results of significant diagnostics from this hospitalization (including imaging, microbiology, ancillary and laboratory) are listed below for reference.     Microbiology: Recent Results (from the past 240 hours)  Resp panel by RT-PCR (RSV, Flu A&B, Covid) Anterior Nasal Swab     Status: Abnormal   Collection Time: 07/23/23  4:04 PM   Specimen: Anterior Nasal Swab  Result Value Ref Range Status   SARS Coronavirus 2 by RT PCR POSITIVE (A) NEGATIVE Final    Comment: (NOTE) SARS-CoV-2 target nucleic acids are DETECTED.  The SARS-CoV-2 RNA is generally detectable in upper respiratory specimens during the acute phase of infection. Positive results are indicative of the presence of the identified virus, but do not rule out bacterial infection or co-infection with other pathogens not detected by the test. Clinical correlation with patient history and other diagnostic information is necessary to determine patient infection status. The expected result is Negative.  Fact Sheet for Patients: BloggerCourse.com  Fact  Sheet for Healthcare Providers: SeriousBroker.it  This test is not yet approved or cleared by the [place] FDA and  has been authorized for detection and/or diagnosis of SARS-CoV-2 by FDA under an Emergency Use Authorization (EUA).  This EUA will remain in effect (meaning this test can be used) for the duration of  the COVID-19 declaration under Section 564(b)(1) of the A ct, 21 U.S.C. section 360bbb-3(b)(1), unless the authorization is terminated or revoked sooner.     Influenza A by PCR  NEGATIVE NEGATIVE Final   Influenza B by PCR NEGATIVE NEGATIVE Final    Comment: (NOTE) The Xpert Xpress SARS-CoV-2/FLU/RSV plus assay is intended as an aid in the diagnosis of influenza from Nasopharyngeal swab specimens and should not be used as a sole basis for treatment. Nasal washings and aspirates are unacceptable for Xpert Xpress SARS-CoV-2/FLU/RSV testing.  Fact Sheet for Patients: BloggerCourse.com  Fact Sheet for Healthcare Providers: SeriousBroker.it  This test is not yet approved or cleared by the [place] FDA and has been authorized for detection and/or diagnosis of SARS-CoV-2 by FDA under an Emergency Use Authorization (EUA). This EUA will remain in effect (meaning this test can be used) for the duration of the COVID-19 declaration under Section 564(b)(1) of the Act, 21 U.S.C. section 360bbb-3(b)(1), unless the authorization is terminated or revoked.     Resp Syncytial Virus by PCR NEGATIVE NEGATIVE Final    Comment: (NOTE) Fact Sheet for Patients: BloggerCourse.com  Fact Sheet for Healthcare Providers: SeriousBroker.it  This test is not yet approved or cleared by the [place] FDA and has been authorized for detection and/or diagnosis of SARS-CoV-2 by FDA under an Emergency Use Authorization (EUA). This EUA will remain in effect  (meaning this test can be used) for the duration of the COVID-19 declaration under Section 564(b)(1) of the Act, 21 U.S.C. section 360bbb-3(b)(1), unless the authorization is terminated or revoked.  Performed at [facility], [address], [place], [place] [address]      Labs: BNP (last 3 results) Recent Labs    07/23/23 1601  BNP 351.9*   Basic Metabolic Panel: Recent Labs  Lab 07/24/23 0555 07/25/23 0541 07/26/23 0732 07/27/23 0920 07/28/23 0646  NA 137 138 138 133* 133*  K 4.0 4.0 3.6 3.9 3.7  CL 99 95* 93* 89* 91*  CO2 27 30 33* 31 32  GLUCOSE 181* 139* 133* 151* 169*  BUN 24* 45* 55* 69* 75*  CREATININE 1.66* 1.84* 1.96* 2.30* 2.09*  CALCIUM 8.0* 8.3* 8.2* 7.8* 7.6*   Liver Function Tests: No results for input(s): "AST", "ALT", "ALKPHOS", "BILITOT", "PROT", "ALBUMIN" in the last 168 hours. No results for input(s): "LIPASE", "AMYLASE" in the last 168 hours. No results for input(s): "AMMONIA" in the last 168 hours. CBC: Recent Labs  Lab 07/23/23 1601 07/24/23 0555 07/25/23 0541 07/27/23 0920 07/28/23 0646  WBC 2.7* 2.4* 7.1 7.6 6.6  NEUTROABS  --  2.1 6.0 6.1 5.2  HGB 11.2* 11.5* 12.4* 12.6* 11.5*  HCT 38.2* 39.3 41.5 41.6 37.6*  MCV 86.4 86.6 83.7 82.2 83.7  PLT 105* 101* 129* 138* 123*   Cardiac Enzymes: No results for input(s): "CKTOTAL", "CKMB", "CKMBINDEX", "TROPONINI" in the last 168 hours. BNP: Invalid input(s): "POCBNP" CBG: Recent Labs  Lab 07/27/23 0828 07/27/23 1140 07/27/23 1658 07/27/23 2145 07/28/23 0811  GLUCAP 121* 205* 234* 317* 160*   D-Dimer No results for input(s): "DDIMER" in the last 72 hours. Hgb A1c No results for input(s): "HGBA1C" in the last 72 hours. Lipid Profile No results for input(s): "CHOL", "HDL", "LDLCALC", "TRIG", "CHOLHDL", "LDLDIRECT" in the last 72 hours. Thyroid function studies No results for input(s): "TSH", "T4TOTAL", "T3FREE", "THYROIDAB" in the last 72 hours.  Invalid  input(s): "FREET3" Anemia work up No results for input(s): "VITAMINB12", "FOLATE", "FERRITIN", "TIBC", "IRON", "RETICCTPCT" in the last 72 hours. Urinalysis    Component Value Date/Time   COLORURINE YELLOW 01/22/2015 1126   APPEARANCEUR CLEAR 01/22/2015 1126   LABSPEC 1.013 01/22/2015 1126   PHURINE 5.0  01/22/2015 1126   GLUCOSEU >1000 (A) 01/22/2015 1126   HGBUR NEGATIVE 01/22/2015 1126   BILIRUBINUR NEGATIVE 01/22/2015 1126   KETONESUR NEGATIVE 01/22/2015 1126   PROTEINUR NEGATIVE 01/22/2015 1126   UROBILINOGEN 0.2 01/22/2015 1126   NITRITE NEGATIVE 01/22/2015 1126   LEUKOCYTESUR NEGATIVE 01/22/2015 1126   Sepsis Labs Recent Labs  Lab 07/24/23 0555 07/25/23 0541 07/27/23 0920 07/28/23 0646  WBC 2.4* 7.1 7.6 6.6   Microbiology Recent Results (from the past 240 hours)  Resp panel by RT-PCR (RSV, Flu A&B, Covid) Anterior Nasal Swab     Status: Abnormal   Collection Time: 07/23/23  4:04 PM   Specimen: Anterior Nasal Swab  Result Value Ref Range Status   SARS Coronavirus 2 by RT PCR POSITIVE (A) NEGATIVE Final    Comment: (NOTE) SARS-CoV-2 target nucleic acids are DETECTED.  The SARS-CoV-2 RNA is generally detectable in upper respiratory specimens during the acute phase of infection. Positive results are indicative of the presence of the identified virus, but do not rule out bacterial infection or co-infection with other pathogens not detected by the test. Clinical correlation with patient history and other diagnostic information is necessary to determine patient infection status. The expected result is Negative.  Fact Sheet for Patients: BloggerCourse.com  Fact Sheet for Healthcare Providers: SeriousBroker.it  This test is not yet approved or cleared by the [place] FDA and  has been authorized for detection and/or diagnosis of SARS-CoV-2 by FDA under an Emergency Use Authorization (EUA).  This EUA will remain  in effect (meaning this test can be used) for the duration of  the COVID-19 declaration under Section 564(b)(1) of the A ct, 21 U.S.C. section 360bbb-3(b)(1), unless the authorization is terminated or revoked sooner.     Influenza A by PCR NEGATIVE NEGATIVE Final   Influenza B by PCR NEGATIVE NEGATIVE Final    Comment: (NOTE) The Xpert Xpress SARS-CoV-2/FLU/RSV plus assay is intended as an aid in the diagnosis of influenza from Nasopharyngeal swab specimens and should not be used as a sole basis for treatment. Nasal washings and aspirates are unacceptable for Xpert Xpress SARS-CoV-2/FLU/RSV testing.  Fact Sheet for Patients: BloggerCourse.com  Fact Sheet for Healthcare Providers: SeriousBroker.it  This test is not yet approved or cleared by the [place] FDA and has been authorized for detection and/or diagnosis of SARS-CoV-2 by FDA under an Emergency Use Authorization (EUA). This EUA will remain in effect (meaning this test can be used) for the duration of the COVID-19 declaration under Section 564(b)(1) of the Act, 21 U.S.C. section 360bbb-3(b)(1), unless the authorization is terminated or revoked.     Resp Syncytial Virus by PCR NEGATIVE NEGATIVE Final    Comment: (NOTE) Fact Sheet for Patients: BloggerCourse.com  Fact Sheet for Healthcare Providers: SeriousBroker.it  This test is not yet approved or cleared by the [place] FDA and has been authorized for detection and/or diagnosis of SARS-CoV-2 by FDA under an Emergency Use Authorization (EUA). This EUA will remain in effect (meaning this test can be used) for the duration of the COVID-19 declaration under Section 564(b)(1) of the Act, 21 U.S.C. section 360bbb-3(b)(1), unless the authorization is terminated or revoked.  Performed at [facility], [name] [place], [place] [address]      FURTHER DISCHARGE INSTRUCTIONS:   Get Medicines reviewed and adjusted: Please take all your medications with you for your next visit with your Primary MD   Laboratory/radiological data: Please request your Primary MD to go over all hospital  tests and procedure/radiological results at the follow up, please ask your Primary MD to get all Hospital records sent to his/her office.   In some cases, they will be blood work, cultures and biopsy results pending at the time of your discharge. Please request that your primary care M.D. goes through all the records of your hospital data and follows up on these results.   Also Note the following: If you experience worsening of your admission symptoms, develop shortness of breath, life threatening emergency, suicidal or homicidal thoughts you must seek medical attention immediately by calling 911 or calling your MD immediately  if symptoms less severe.   You must read complete instructions/literature along with all the possible adverse reactions/side effects for all the Medicines you take and that have been prescribed to you. Take any new Medicines after you have completely understood and accpet all the possible adverse reactions/side effects.    Do not drive when taking Pain medications or sleeping medications (Benzodaizepines)   Do not take more than prescribed Pain, Sleep and Anxiety Medications. It is not advisable to combine anxiety,sleep and pain medications without talking with your primary care practitioner   Special Instructions: If you have smoked or chewed Tobacco  in the last 2 yrs please stop smoking, stop any regular Alcohol  and or any Recreational drug use.   Wear Seat belts while driving.   Please note: You were cared for by a hospitalist during your hospital stay. Once you are discharged, your primary care physician will handle any further medical issues. Please note that NO REFILLS for any discharge medications will be authorized  once you are discharged, as it is imperative that you return to your primary care physician (or establish a relationship with a primary care physician if you do not have one) for your post hospital discharge needs so that they can reassess your need for medications and monitor your lab values  Time coordinating discharge: Over 30 minutes  SIGNED:   Hughie Closs, MD  [facility] 07/28/2023, 9:34 AM *Please note that this is a verbal dictation therefore any spelling or grammatical errors are due to the "Dragon Medical One" system interpretation. If 7PM-7AM, please contact night-coverage [email or web address]

## 2023-07-28 NOTE — Progress Notes (Signed)
 TOC discharge meds delivered in a secure bag to pt in room  by this RN

## 2023-07-28 NOTE — TOC Transition Note (Signed)
 Transition of Care [facility]) - Discharge Note   Patient Details  Name: Erik [name] MRN: [id number] Date of Birth: 09/14/1940  [facility] CM/SW Contact:  Otelia Santee, LCSW Phone Number: 07/28/2023, 10:19 AM   Clinical Narrative:    Met with pt and confirmed plan to return home with home health services. Pt denies having HH in the past and does not have preference for agency. HHPT/OT haas been arranged with [facility]. HH orders are in place. No DME needs identified.    Final next level of care: Home w Home Health Services Barriers to Discharge: No Barriers Identified   Patient Goals and CMS Choice Patient states their goals for this hospitalization and ongoing recovery are:: To return home CMS Medicare.gov Compare Post Acute Care list provided to:: Patient Choice offered to / list presented to : Patient [place] ownership interest in [facility].provided to::  (NA)    Discharge Placement                       Discharge Plan and Services Additional resources added to the After Visit Summary for                  DME Arranged: N/A DME Agency: NA       HH Arranged: PT, OT HH Agency: [facility] Date [facility] Agency Contacted: 07/28/23 Time HH Agency Contacted: 1019 Representative spoke with at [facility] Agency: Cindie  Social Drivers of Health (SDOH) Interventions SDOH Screenings   Food Insecurity: No Food Insecurity (07/23/2023)  Housing: Low Risk  (07/23/2023)  Transportation Needs: No Transportation Needs (07/23/2023)  Utilities: Not At Risk (07/23/2023)  Depression (PHQ2-9): Low Risk  (02/25/2020)  Social Connections: Moderately Isolated (07/23/2023)  Tobacco Use: Medium Risk (07/23/2023)     Readmission Risk Interventions    07/28/2023   10:17 AM  Readmission Risk Prevention Plan  Transportation Screening Complete  PCP or Specialist Appt within 5-7 Days Complete  Home Care Screening Complete  Medication Review (RN CM) Complete

## 2023-07-31 ENCOUNTER — Telehealth: Payer: Self-pay | Admitting: Neurology

## 2023-07-31 ENCOUNTER — Other Ambulatory Visit: Payer: Self-pay

## 2023-07-31 NOTE — Telephone Encounter (Signed)
 Sent to Dr.Patel for review.

## 2023-07-31 NOTE — Telephone Encounter (Signed)
 Patient wife called and states that we increased the medication to a whole pill for the Oxcarbazepine 150 mg. He usually gets 90 pills but may need more since we up the dosages  She would like to have the new RX sent into the [facility]

## 2023-08-01 MED ORDER — OXCARBAZEPINE 150 MG PO TABS: 150.0000 mg | ORAL_TABLET | Freq: Two times a day (BID) | ORAL | 1 refills | Status: DC

## 2023-08-01 NOTE — Telephone Encounter (Signed)
 Updated Rx sent

## 2023-08-06 DIAGNOSIS — I1310 Hypertensive heart and chronic kidney disease without heart failure, with stage 1 through stage 4 chronic kidney disease, or unspecified chronic kidney disease: Secondary | ICD-10-CM | POA: Diagnosis not present

## 2023-08-06 DIAGNOSIS — Z7901 Long term (current) use of anticoagulants: Secondary | ICD-10-CM | POA: Diagnosis not present

## 2023-08-06 DIAGNOSIS — E11628 Type 2 diabetes mellitus with other skin complications: Secondary | ICD-10-CM | POA: Diagnosis not present

## 2023-08-06 DIAGNOSIS — E7800 Pure hypercholesterolemia, unspecified: Secondary | ICD-10-CM | POA: Diagnosis not present

## 2023-08-06 DIAGNOSIS — Z794 Long term (current) use of insulin: Secondary | ICD-10-CM | POA: Diagnosis not present

## 2023-08-06 DIAGNOSIS — I130 Hypertensive heart and chronic kidney disease with heart failure and stage 1 through stage 4 chronic kidney disease, or unspecified chronic kidney disease: Secondary | ICD-10-CM | POA: Diagnosis not present

## 2023-08-06 DIAGNOSIS — E1129 Type 2 diabetes mellitus with other diabetic kidney complication: Secondary | ICD-10-CM | POA: Diagnosis not present

## 2023-08-06 DIAGNOSIS — R531 Weakness: Secondary | ICD-10-CM | POA: Diagnosis not present

## 2023-08-06 DIAGNOSIS — I509 Heart failure, unspecified: Secondary | ICD-10-CM | POA: Diagnosis not present

## 2023-08-06 DIAGNOSIS — N1832 Chronic kidney disease, stage 3b: Secondary | ICD-10-CM | POA: Diagnosis not present

## 2023-08-06 DIAGNOSIS — J9601 Acute respiratory failure with hypoxia: Secondary | ICD-10-CM | POA: Diagnosis not present

## 2023-08-06 DIAGNOSIS — I480 Paroxysmal atrial fibrillation: Secondary | ICD-10-CM | POA: Diagnosis not present

## 2023-08-09 NOTE — Progress Notes (Deleted)
 Cardiology Office Note:    Date:  08/09/2023   ID:  Erik [name], Erik [name] 11/27/1940, MRN [id number]  PCP:  Gaspar Garbe, MD  Cardiologist:  Meriam Sprague, MD (Inactive) { Click to update primary MD,subspecialty MD or APP then REFRESH:1}    Referring MD: Gaspar Garbe, MD   Chief Complaint: hospital follow-up of acute hypoxic respiratory failure  History of Present Illness:    Erik [name] is a 83 y.o. male with a history of non-obstructive CAD on remote cardiac catheterization in 2004, non-ischemic cardiomyopathy/ chronic combined CHF with EF as low as 35-40% in the past but 45-50% on last Echo in 08/2021, paroxysmal atrial fibrillation on Xarelto, PVCs, PAD with non-obstructive disease of bilateral lower extremities noted on dopplers in 08/2019, hypertension, hyperlipidemia, type 2 diabetes mellitus, and CKD stage III who presents today for hospital follow-up.   Patient has a long history of non-ischemic cardiomyopathy with EF as low as 35-40% in 2009. Cardiac catheterization in 2004 showed mild non-obstructive CAD. Myoview in 2015 showed was low risk with a primarily fixed mild to mid apical anteroseptal defect with a wall motion abnormality in the same region suggestive of possible prior infarction with no ischemia. EF subsequently improved to 50-55% in 2016. He also had a history of paroxysmal atrial fibrillation and PAD. Lower extremity dopplers in 08/2019 showed atherosclerosis of bilateral iliac, common femoral, femoral, popliteal, and tibial arteries with <50% stenosis of common iliac and external iliac arteries and 30-49% stenosis of mid SFA on the right and <50% stenosis of common iliac artery, 30-49% stenosis of SFA, and 50-74ST of proximal to mid anterior tibial artery on the left. ABIs were normal bilaterally.  Last Echo in 08/2021 showed LVEF of 45-50% with global hypokinesis and grade 1 diastolic dysfunction, mildly enlarged RV function with normal RV function, and  mild MR. Last ABIs in 10/2022 were normal bilaterally.   He was last seen by Eligha Bridegroom, NP, in 03/2023 at which time he was doing well from a cardiac standpoint. However, he was not taking his Xarelto and did not remember why or when this was stopped. There was also some confusion about some of his other medications. He was restarted on Xarelto.    He was recently admitted from 07/23/2023 to 07/28/2023 for acute hypoxic respiratory failure secondary to COVID-19 and bronchospasm. BNP was mildly elevated in the 300s but there was no evidence of edema on chest x-ray. He was treated with Prednisone, breathing treatments, and IV Lasix with improvement. Of note Cardiology was not consulted during this admission.  Patient presents today for follow-up. ***  CAD Mild non-obstructed CAD noted on cardiac catheterization in 2004. - No chest pain.  - No aspirin due to need for full anticoagulation. - Continue statin.   Chronic Combined CHF Non-Ischemic Cardiomyopathy EF as low as 35-40% in the past. Last Echo in 08/2021 showed LVEF of 45-50% with global hypokinesis and grade 1 diastolic dysfunction, mildly enlarged RV function with normal RV function, and mild MR. - Euvolemic on exam. *** - Continue Toprol-XL 50mg  daily.  - SGLT2 inhibitor *** - No ACEi/ARB/ ARNI or MRA due to renal function.  - Continue daily weight and sodium/ fluid restrictions.   Paroxysmal Atrial Fibrillation Maintaining sinus rhythm on exam.  - Continue Toprol-XL 50mg  daily.  - Continue Xarelto 15mg  daily (reduced dose due to renal function).   PAD Lower extremity dopplers in 08/2019 showed mild to moderate disease bilaterally. ABIs in 08/2019 and  again in 10/2022 were normal.  - No claudication. *** - No aspirin due to need for full anticoagulation.  - Continue statin.  Hypertension BP well controlled. *** - Continue Toprol-XL 50mg  daily.   Hyperlipidemia Lipid panel in ***: - Continue Lipitor 40mg  daily.   Type 2  Diabetes Mellitus Hemoglobin A1c *** - On Insulin.  - Management per PCP.  CKD Stage IIIa Baseline creatinine around 1.7. However, creatinine peaked around 2.3 during recent admission and was 2.09 on discharge.  - Will repeat BMET today. ***  EKGs/Labs/Other Studies Reviewed:    The following studies were reviewed:  Myoview 5/1/20215: Impressions: 1. Primarily fixed, medium-sized, mild mid to apical anteroseptal  perfusion defect. There was a wall motion abnormality in the same  region. This is suggestive of possible prior infarction with no  significant ischemia. 2.  EF mildly decreased at 48% with apical septal hypokinesis. 3.  Overall low risk study with no significant ischemia noted.  _______________  Lower Extremity Dopplers 09/09/2019: Summary:  Right: Atherosclerosis in the iliac, common femoral, femoral, popliteal  and tibial arteries.  <50% stenosis in the common iliac artery.  <50% stenosis in the external iliac artery.  30-49% stenosis in the mid superficial femoral artery.  Three vessel runoff.   Left: Atherosclerosis in the iliac, common femoral, femoral, popliteal and  tibial arteries.  <50% stenosis in the common iliac artery.  30-49% stenosis in the superficial femoral artery.  50-74% stenosis in the proximal/mid anterior tibial artery.  Three vessel runoff.   ABI Summary: Right: Resting right ankle-brachial index is within normal range. No  evidence of significant right lower extremity arterial disease. The right  toe-brachial index is abnormal.   Left: Resting left ankle-brachial index is within normal range. No  evidence of significant left lower extremity arterial disease. The left  toe-brachial index is abnormal.  _______________  Echocardiogram 09/19/2021: Impressions: 1. Left ventricular ejection fraction, by estimation, is 45 to 50%. The  left ventricle has mildly decreased function. The left ventricle  demonstrates global hypokinesis. Left  ventricular diastolic parameters are  consistent with Grade I diastolic  dysfunction (impaired relaxation). The average left ventricular global  longitudinal strain is -17.9 %.   2. Right ventricular systolic function is normal. The right ventricular  size is mildly enlarged. There is mildly elevated pulmonary artery  systolic pressure.   3. Left atrial size was mildly dilated.   4. The mitral valve is grossly normal. Mild mitral valve regurgitation.   5. The aortic valve is tricuspid. There is mild calcification of the  aortic valve. There is mild thickening of the aortic valve. Aortic valve  regurgitation is trivial. Aortic valve sclerosis/calcification is present,  without any evidence of aortic  stenosis.  _______________  ABIs 11/14/2022: Bilateral ABIs appear essentially unchanged compared to prior study on 09/09/19.   EKG:  EKG not ordered today. EKG personally reviewed and demonstrates ***.  Recent Labs: 07/23/2023: B Natriuretic Peptide 351.9 07/28/2023: BUN 75; Creatinine, Ser 2.09; Hemoglobin 11.5; Platelets 123; Potassium 3.7; Sodium 133  Recent Lipid Panel    Component Value Date/Time   CHOL 115 09/03/2021 0839   TRIG 95 09/03/2021 0839   HDL 42 09/03/2021 0839   CHOLHDL 2.7 09/03/2021 0839   LDLCALC 55 09/03/2021 0839    Physical Exam:    Vital Signs: There were no vitals taken for this visit.    Wt Readings from Last 3 Encounters:  07/27/23 169 lb 15.6 oz (77.1 kg)  04/08/23 190 lb (  86.2 kg)  03/26/23 190 lb (86.2 kg)     General: 83 y.o. male in no acute distress. HEENT: Normocephalic and atraumatic. Sclera clear.  Neck: Supple. No carotid bruits. No JVD. Heart: *** RRR. Distinct S1 and S2. No murmurs, gallops, or rubs.  Lungs: No increased work of breathing. Clear to ausculation bilaterally. No wheezes, rhonchi, or rales.  Abdomen: Soft, non-distended, and non-tender to palpation.  Extremities: No lower extremity edema.  Radial and distal pedal pulses 2+  and equal bilaterally. Skin: Warm and dry. Neuro: No focal deficits. Psych: Normal affect. Responds appropriately.   Assessment:    No diagnosis found.  Plan:     Disposition: Follow up in ***   Signed, Corrin Parker, PA-C  08/09/2023 9:06 AM    [facility]

## 2023-08-14 ENCOUNTER — Ambulatory Visit: Admitting: Student

## 2023-08-19 DIAGNOSIS — I1310 Hypertensive heart and chronic kidney disease without heart failure, with stage 1 through stage 4 chronic kidney disease, or unspecified chronic kidney disease: Secondary | ICD-10-CM | POA: Diagnosis not present

## 2023-08-19 DIAGNOSIS — R053 Chronic cough: Secondary | ICD-10-CM | POA: Diagnosis not present

## 2023-08-19 DIAGNOSIS — E1129 Type 2 diabetes mellitus with other diabetic kidney complication: Secondary | ICD-10-CM | POA: Diagnosis not present

## 2023-08-19 DIAGNOSIS — J069 Acute upper respiratory infection, unspecified: Secondary | ICD-10-CM | POA: Diagnosis not present

## 2023-08-19 DIAGNOSIS — Z794 Long term (current) use of insulin: Secondary | ICD-10-CM | POA: Diagnosis not present

## 2023-08-19 DIAGNOSIS — I480 Paroxysmal atrial fibrillation: Secondary | ICD-10-CM | POA: Diagnosis not present

## 2023-08-19 DIAGNOSIS — K219 Gastro-esophageal reflux disease without esophagitis: Secondary | ICD-10-CM | POA: Diagnosis not present

## 2023-08-19 DIAGNOSIS — R0609 Other forms of dyspnea: Secondary | ICD-10-CM | POA: Diagnosis not present

## 2023-08-19 DIAGNOSIS — Z7901 Long term (current) use of anticoagulants: Secondary | ICD-10-CM | POA: Diagnosis not present

## 2023-08-27 NOTE — Progress Notes (Unsigned)
 Cardiology Office Note    Patient Name: Erik [name] Date of Encounter: 08/28/2023  Primary Care Provider:  Gaspar Garbe, MD Primary Cardiologist:  Meriam Sprague, MD (Inactive) Primary Electrophysiologist: None   Past Medical History    Past Medical History:  Diagnosis Date   Chronic combined systolic and diastolic CHF (congestive heart failure) (HCC)    CKD (chronic kidney disease), stage III (HCC)    Coronary artery disease    a. Nonobstructive by cath 2004. b. Nuc 09/2013 - no ischemia, fixed defect suggestive of possible prior infarction, felt low risk, EF 48%.    Diabetes mellitus    GERD (gastroesophageal reflux disease)    Hyperlipidemia    Hypertension    NICM (nonischemic cardiomyopathy) (HCC)    a. EF 35-40% in 2009, 55% in 2010. b. 09/2013: 35-40% by echo and 48% by nuc. c. EF 50-55% by echo 12/2014.   PAD (peripheral artery disease) (HCC)    a. mild by noninvasive testing.   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 male with a PMH of CAD with no significant disease by LHC in 2004, PVCs, NICM, CKD, type II, DM, HLD, chronic combined CHF, PAD who presents today for posthospital follow-up.  Erik [name] underwent an LHC in 2004 that showed irregularities in the LAD with no significant disease, he had 2D echo completed in 2009 that showed reduced EF of 35 as 40% GDMT titrated with repeat echo in 2010 showing EF of 55%.  He was admitted in 2015 with AF with RVR and EF reduced back to 35 as 40%.  He was treated with Cardizem and spontaneously converted to sinus rhythm.  He was placed on Xarelto nuclear stress test due to  decreased LV function that showed mid to apical anterior septal perfusion defect with possible infarction but no ischemia.  He was on Cardizem for rate control but had to discontinue due to dizziness and bradycardia in 01/2019.  He had 2D echo completed 08/2021 that showed EF of 45-50% with global hypokinesis and normal RV function with mild PHTN mild MR.  He was last seen in office on 04/08/2023 and was coping with the death of his son and a nonhealing wound on his heel that was being managed by wound clinic.  He had Xarelto discontinued by his nephrologist and was resumed at 15 mg daily.  He was admitted on 07/23/2023 with shortness of breath and nonproductive cough.  Both he and his wife tested positive for COVID and he was admitted for evaluation.  He developed acute respiratory failure with hypoxia and bronchospasm and BNP was elevated at 351.  He was treated with IV Lasix 40 mg but developed AKI with baseline creatinine between 1.5 and 1.7 and creatinine trending to 2.3.  He had improvement to the [place] with diuretics.  He was discharged on 07/28/2023 recommendations for home health PT.  Erik [name] presents today with his son for posthospital follow-up.S he reports since discharge, he has been monitoring his weight and fluid status. He is currently taking Neurontin, prescribed by a neurologist, Dr. Allena Katz, and has been on it for  six to eight months. It makes him sleepy but does not cause other side effects. He also takes [place] for diabetes management. He attributes his drowsiness to his current medication regimen, particularly Neurontin, which he takes at a dose of 300 mg. He reports a history of falls, both indoors and outdoors, which he attributes to weakness and possibly medication effects. He describes an incident where he fell in the bathroom and another outside, requiring assistance to get up. He has a history of a heel wound that required dressing changes, which has since healed.  He did not strike his  head with either fall per the patient and his son.  He has been off Xarelto for approximately a year due to a change in his medication regimen by a previous cardiologist. He prefers Xarelto and metoprolol, stating he was effective in managing his heart condition without causing side effects. He also mentions a previous prescription of Cardizem for heart rate control. Patient denies chest pain, palpitations, dyspnea, PND, orthopnea, nausea, vomiting, dizziness, syncope, edema, weight gain, or early satiety.  Discussed the use of AI scribe software for clinical note transcription with the patient, who gave verbal consent to proceed.  History of Present Illness   Review of Systems  Please see the history of present illness.    All other systems reviewed and are otherwise negative except as noted above.  Physical Exam    Wt Readings from Last 3 Encounters:  08/28/23 171 lb 3.2 oz (77.7 kg)  07/27/23 169 lb 15.6 oz (77.1 kg)  04/08/23 190 lb (86.2 kg)   VS: Vitals:   08/28/23 1431  BP: (!) 142/60  Pulse: 74  SpO2: 97%  ,Body mass index is 23.88 kg/m. GEN: Well nourished, well developed in no acute distress Neck: No JVD; No carotid bruits Pulmonary: Clear to auscultation without rales, wheezing or rhonchi  Cardiovascular: Normal rate. Regular rhythm. Normal S1. Normal S2.   Murmurs: There is no murmur.  ABDOMEN: Soft, non-tender, non-distended EXTREMITIES: Trace bilateral edema  EKG/LABS/ Recent Cardiac Studies   ECG personally reviewed by me today -none completed today  Risk Assessment/Calculations:    CHA2DS2-VASc Score = 5   This indicates a 7.2% annual risk of stroke. The patient's score is based upon: CHF History: 1 HTN History: 1 Diabetes History: 1 Stroke History: 0 Vascular Disease History: 0 Age Score: 2 Gender Score: 0         Lab Results  Component Value Date   WBC 6.6 07/28/2023   HGB 11.5 (L) 07/28/2023   HCT 37.6 (L) 07/28/2023   MCV 83.7 07/28/2023    PLT 123 (L) 07/28/2023   Lab Results  Component Value Date   CREATININE 2.09 (H) 07/28/2023   BUN 75 (H) 07/28/2023   NA 133 (L) 07/28/2023   K 3.7 07/28/2023   CL 91 (L) 07/28/2023   CO2 32 07/28/2023   Lab Results  Component Value Date   CHOL 115 09/03/2021   HDL 42 09/03/2021   LDLCALC 55 09/03/2021   TRIG 95 09/03/2021   CHOLHDL 2.7 09/03/2021    Lab Results  Component Value Date   HGBA1C 7.6 (H) 09/03/2021   Assessment & Plan    1.CAD Mild non-obstructed CAD noted on cardiac catheterization in 2004. - No chest pain.  - No aspirin due to need for full anticoagulation. - Continue statin.   2.Chronic Combined CHF Non-Ischemic Cardiomyopathy EF as low as 35-40% in the past. Last Echo in 08/2021 showed LVEF of 45-50%  with global hypokinesis and grade 1 diastolic dysfunction, mildly enlarged RV function with normal RV function, and mild MR. - Euvolemic on exam.  - Continue Toprol-XL 50mg  daily.  -Consider adding SGLT2 inhibitor in the future if creatinine improves -BMET today - No ACEi/ARB/ ARNI or MRA due to renal function.  - Continue daily weight and sodium/ fluid restrictions.   3.Paroxysmal Atrial Fibrillation Maintaining sinus rhythm on exam.  - Continue Toprol-XL 50mg  daily.  - Continue Xarelto 15mg  daily (reduced dose due to renal function).  - Provide Xarelto samples to start immediately. -Patient currently in process of attaining patient assistance for medication - Monitor for falls and bleeding.  4.PAD Lower extremity dopplers in 08/2019 showed mild to moderate disease bilaterally. ABIs in 08/2019 and again in 10/2022 were normal.  - No claudication.  - No aspirin due to need for full anticoagulation.  - Continue statin.  5.Hypertension BP well controlled at 142/60 - Continue Toprol-XL 50mg  daily.   6.CKD Stage IIIa Baseline creatinine around 1.7. However, creatinine peaked around 2.3 during recent admission and was 2.09 on discharge.  - Will  repeat BMET today.    Disposition: Follow-up with Meriam Sprague, MD (Inactive) or APP in 6 months    Signed, Napoleon Form, Leodis Rains, NP 08/28/2023, 3:02 PM [facility]

## 2023-08-28 ENCOUNTER — Ambulatory Visit: Attending: Nurse Practitioner | Admitting: Nurse Practitioner

## 2023-08-28 ENCOUNTER — Encounter: Payer: Self-pay | Admitting: Nurse Practitioner

## 2023-08-28 VITALS — BP 142/60 | HR 74 | Ht 71.0 in | Wt 171.2 lb

## 2023-08-28 DIAGNOSIS — I480 Paroxysmal atrial fibrillation: Secondary | ICD-10-CM | POA: Diagnosis not present

## 2023-08-28 DIAGNOSIS — I2583 Coronary atherosclerosis due to lipid rich plaque: Secondary | ICD-10-CM

## 2023-08-28 DIAGNOSIS — I428 Other cardiomyopathies: Secondary | ICD-10-CM

## 2023-08-28 DIAGNOSIS — N183 Chronic kidney disease, stage 3 unspecified: Secondary | ICD-10-CM

## 2023-08-28 DIAGNOSIS — I5022 Chronic systolic (congestive) heart failure: Secondary | ICD-10-CM | POA: Diagnosis not present

## 2023-08-28 DIAGNOSIS — I10 Essential (primary) hypertension: Secondary | ICD-10-CM

## 2023-08-28 DIAGNOSIS — I2510 Atherosclerotic heart disease of native coronary artery without angina pectoris: Secondary | ICD-10-CM

## 2023-08-28 MED ORDER — RIVAROXABAN 15 MG PO TABS: 15.0000 mg | ORAL_TABLET | Freq: Every day | ORAL | 0 refills | Status: DC

## 2023-08-28 NOTE — Patient Instructions (Signed)
 Medication Instructions:  RESTART Toprol XL 50mg  Take 1 tablet once a day  RESTART Xarelto 15mg  Take 1 tablet once a day  *If you need a refill on your cardiac medications before your next appointment, please call your pharmacy*  Lab Work: TODAY-BMET If you have labs (blood work) drawn today and your tests are completely normal, you will receive your results only by: MyChart Message (if you have MyChart) OR A paper copy in the mail If you have any lab test that is abnormal or we need to change your treatment, we will call you to review the results.  Testing/Procedures: NONE ORDERED  Follow-Up: At [facility], you and your health needs are our priority.  As part of our continuing mission to provide you with exceptional heart care, our providers are all part of one team.  This team includes your primary Cardiologist (physician) and Advanced Practice Providers or APPs (Physician Assistants and Nurse Practitioners) who all work together to provide you with the care you need, when you need it.  Your next appointment:   6 month(s)  Provider:   Robin Searing, NP   We recommend signing up for the patient portal called "MyChart".  Sign up information is provided on this After Visit Summary.  MyChart is used to connect with patients for Virtual Visits (Telemedicine).  Patients are able to view lab/test results, encounter notes, upcoming appointments, etc.  Non-urgent messages can be sent to your provider as well.   To learn more about what you can do with MyChart, go to [email or web address].   Other Instructions       1st Floor: - Lobby - Registration  - Pharmacy  - Lab - Cafe  2nd Floor: - PV Lab - Diagnostic Testing (echo, CT, nuclear med)  3rd Floor: - Vacant  4th Floor: - TCTS (cardiothoracic surgery) - AFib Clinic - Structural Heart Clinic - Vascular Surgery  - Vascular Ultrasound  5th Floor: - HeartCare Cardiology (general and EP) - Clinical Pharmacy for  coumadin, hypertension, lipid, weight-loss medications, and med management appointments    Valet parking services will be available as well.

## 2023-08-29 LAB — BASIC METABOLIC PANEL WITH GFR
BUN/Creatinine Ratio: 17 (ref 10–24)
BUN: 26 mg/dL (ref 8–27)
CO2: 25 mmol/L (ref 20–29)
Calcium: 8.6 mg/dL (ref 8.6–10.2)
Chloride: 96 mmol/L (ref 96–106)
Creatinine, Ser: 1.56 mg/dL — ABNORMAL HIGH (ref 0.76–1.27)
Glucose: 152 mg/dL — ABNORMAL HIGH (ref 70–99)
Potassium: 4.5 mmol/L (ref 3.5–5.2)
Sodium: 137 mmol/L (ref 134–144)
eGFR: 44 mL/min/{1.73_m2} — ABNORMAL LOW (ref >59)

## 2023-09-02 ENCOUNTER — Telehealth: Payer: Self-pay | Admitting: Nurse Practitioner

## 2023-09-02 NOTE — Telephone Encounter (Signed)
 Gaston Islam., NP 08/29/2023  7:16 AM EDT     Please let patient know electrolytes are stable and creatinine is improving.  Please advise patient to stay hydrated with at least 64 ounces of water daily.     Robin Searing, NP    Called and spoke with wife Marily Memos and relayed above results to her. No further questions. States "he's been drinking water like crazy ever since he got out of the hospital."

## 2023-09-02 NOTE — Telephone Encounter (Signed)
 Patient's wife is calling stating the patient didn't fully understand his lab results discussed, and is requesting a callback to discuss with her instead.

## 2023-09-02 NOTE — Telephone Encounter (Signed)
 Tried to call patient x3 phone just rings. Unable to leave voicail

## 2023-09-11 ENCOUNTER — Other Ambulatory Visit (HOSPITAL_COMMUNITY): Payer: Self-pay

## 2023-09-18 DIAGNOSIS — N1832 Chronic kidney disease, stage 3b: Secondary | ICD-10-CM | POA: Diagnosis not present

## 2023-09-18 DIAGNOSIS — R972 Elevated prostate specific antigen [PSA]: Secondary | ICD-10-CM | POA: Diagnosis not present

## 2023-09-18 DIAGNOSIS — E1129 Type 2 diabetes mellitus with other diabetic kidney complication: Secondary | ICD-10-CM | POA: Diagnosis not present

## 2023-09-18 DIAGNOSIS — E7800 Pure hypercholesterolemia, unspecified: Secondary | ICD-10-CM | POA: Diagnosis not present

## 2023-09-25 DIAGNOSIS — E1129 Type 2 diabetes mellitus with other diabetic kidney complication: Secondary | ICD-10-CM | POA: Diagnosis not present

## 2023-09-25 DIAGNOSIS — Z794 Long term (current) use of insulin: Secondary | ICD-10-CM | POA: Diagnosis not present

## 2023-09-25 DIAGNOSIS — I130 Hypertensive heart and chronic kidney disease with heart failure and stage 1 through stage 4 chronic kidney disease, or unspecified chronic kidney disease: Secondary | ICD-10-CM | POA: Diagnosis not present

## 2023-09-25 DIAGNOSIS — N401 Enlarged prostate with lower urinary tract symptoms: Secondary | ICD-10-CM | POA: Diagnosis not present

## 2023-09-25 DIAGNOSIS — Z1331 Encounter for screening for depression: Secondary | ICD-10-CM | POA: Diagnosis not present

## 2023-09-25 DIAGNOSIS — F411 Generalized anxiety disorder: Secondary | ICD-10-CM | POA: Diagnosis not present

## 2023-09-25 DIAGNOSIS — I509 Heart failure, unspecified: Secondary | ICD-10-CM | POA: Diagnosis not present

## 2023-09-25 DIAGNOSIS — E7800 Pure hypercholesterolemia, unspecified: Secondary | ICD-10-CM | POA: Diagnosis not present

## 2023-09-25 DIAGNOSIS — D6869 Other thrombophilia: Secondary | ICD-10-CM | POA: Diagnosis not present

## 2023-09-25 DIAGNOSIS — I480 Paroxysmal atrial fibrillation: Secondary | ICD-10-CM | POA: Diagnosis not present

## 2023-09-25 DIAGNOSIS — Z1339 Encounter for screening examination for other mental health and behavioral disorders: Secondary | ICD-10-CM | POA: Diagnosis not present

## 2023-09-25 DIAGNOSIS — R972 Elevated prostate specific antigen [PSA]: Secondary | ICD-10-CM | POA: Diagnosis not present

## 2023-09-25 DIAGNOSIS — E11628 Type 2 diabetes mellitus with other skin complications: Secondary | ICD-10-CM | POA: Diagnosis not present

## 2023-09-25 DIAGNOSIS — Z0000 Encounter for general adult medical examination without abnormal findings: Secondary | ICD-10-CM | POA: Diagnosis not present

## 2023-09-25 DIAGNOSIS — N1832 Chronic kidney disease, stage 3b: Secondary | ICD-10-CM | POA: Diagnosis not present

## 2023-09-25 DIAGNOSIS — Z7901 Long term (current) use of anticoagulants: Secondary | ICD-10-CM | POA: Diagnosis not present

## 2023-09-25 DIAGNOSIS — D692 Other nonthrombocytopenic purpura: Secondary | ICD-10-CM | POA: Diagnosis not present

## 2023-09-26 ENCOUNTER — Telehealth: Payer: Self-pay

## 2023-09-26 ENCOUNTER — Other Ambulatory Visit (HOSPITAL_COMMUNITY): Payer: Self-pay

## 2023-09-26 NOTE — Telephone Encounter (Signed)
 PAP: Patient assistance application for Xarelto  through [employer] has been mailed to pt's home address on file. Provider portion of application will be faxed to provider's office.   Provider portion of application has been sent to Dr. Richard Tisovec for review and signtaure

## 2023-09-26 NOTE — Telephone Encounter (Signed)
 Received provider portion of patient assistance application from Dr. Tisovec

## 2023-10-03 ENCOUNTER — Other Ambulatory Visit: Payer: Self-pay

## 2023-10-07 ENCOUNTER — Encounter (HOSPITAL_BASED_OUTPATIENT_CLINIC_OR_DEPARTMENT_OTHER): Attending: Internal Medicine | Admitting: Internal Medicine

## 2023-10-07 DIAGNOSIS — I87321 Chronic venous hypertension (idiopathic) with inflammation of right lower extremity: Secondary | ICD-10-CM | POA: Diagnosis not present

## 2023-10-07 DIAGNOSIS — E11621 Type 2 diabetes mellitus with foot ulcer: Secondary | ICD-10-CM | POA: Diagnosis not present

## 2023-10-07 DIAGNOSIS — S81802A Unspecified open wound, left lower leg, initial encounter: Secondary | ICD-10-CM | POA: Insufficient documentation

## 2023-10-07 DIAGNOSIS — I87312 Chronic venous hypertension (idiopathic) with ulcer of left lower extremity: Secondary | ICD-10-CM | POA: Insufficient documentation

## 2023-10-07 DIAGNOSIS — X58XXXA Exposure to other specified factors, initial encounter: Secondary | ICD-10-CM | POA: Diagnosis not present

## 2023-10-07 DIAGNOSIS — L97418 Non-pressure chronic ulcer of right heel and midfoot with other specified severity: Secondary | ICD-10-CM | POA: Diagnosis not present

## 2023-10-07 DIAGNOSIS — S91302A Unspecified open wound, left foot, initial encounter: Secondary | ICD-10-CM | POA: Diagnosis not present

## 2023-10-07 NOTE — Telephone Encounter (Signed)
 Per Herminia Lope patient submitted his portion of the patient assistance application electronically.  Provider portion has been submitted to [facility] [employer]

## 2023-10-10 ENCOUNTER — Other Ambulatory Visit (HOSPITAL_COMMUNITY): Payer: Self-pay

## 2023-10-14 ENCOUNTER — Encounter (HOSPITAL_BASED_OUTPATIENT_CLINIC_OR_DEPARTMENT_OTHER): Admitting: Internal Medicine

## 2023-10-15 ENCOUNTER — Encounter (HOSPITAL_BASED_OUTPATIENT_CLINIC_OR_DEPARTMENT_OTHER): Admitting: Internal Medicine

## 2023-10-15 DIAGNOSIS — S91302A Unspecified open wound, left foot, initial encounter: Secondary | ICD-10-CM | POA: Diagnosis not present

## 2023-10-15 DIAGNOSIS — I87321 Chronic venous hypertension (idiopathic) with inflammation of right lower extremity: Secondary | ICD-10-CM | POA: Diagnosis not present

## 2023-10-15 DIAGNOSIS — E11621 Type 2 diabetes mellitus with foot ulcer: Secondary | ICD-10-CM | POA: Diagnosis not present

## 2023-10-15 DIAGNOSIS — L97418 Non-pressure chronic ulcer of right heel and midfoot with other specified severity: Secondary | ICD-10-CM

## 2023-10-15 NOTE — Telephone Encounter (Signed)
 PAP: Patient has been denied for patient assistance by [employer] due to insurance not allowing benefits investigation.    Patient wants to pay the $47 copay

## 2023-10-16 DIAGNOSIS — L97412 Non-pressure chronic ulcer of right heel and midfoot with fat layer exposed: Secondary | ICD-10-CM | POA: Diagnosis not present

## 2023-10-21 ENCOUNTER — Encounter (HOSPITAL_BASED_OUTPATIENT_CLINIC_OR_DEPARTMENT_OTHER): Admitting: Internal Medicine

## 2023-10-21 DIAGNOSIS — L97418 Non-pressure chronic ulcer of right heel and midfoot with other specified severity: Secondary | ICD-10-CM | POA: Diagnosis not present

## 2023-10-21 DIAGNOSIS — I87321 Chronic venous hypertension (idiopathic) with inflammation of right lower extremity: Secondary | ICD-10-CM | POA: Diagnosis not present

## 2023-10-21 DIAGNOSIS — E11621 Type 2 diabetes mellitus with foot ulcer: Secondary | ICD-10-CM | POA: Diagnosis not present

## 2023-10-21 DIAGNOSIS — S81802A Unspecified open wound, left lower leg, initial encounter: Secondary | ICD-10-CM | POA: Diagnosis not present

## 2023-10-21 DIAGNOSIS — S91302A Unspecified open wound, left foot, initial encounter: Secondary | ICD-10-CM | POA: Diagnosis not present

## 2023-10-21 DIAGNOSIS — I87312 Chronic venous hypertension (idiopathic) with ulcer of left lower extremity: Secondary | ICD-10-CM | POA: Diagnosis not present

## 2023-10-22 DIAGNOSIS — N401 Enlarged prostate with lower urinary tract symptoms: Secondary | ICD-10-CM | POA: Diagnosis not present

## 2023-10-22 DIAGNOSIS — R972 Elevated prostate specific antigen [PSA]: Secondary | ICD-10-CM | POA: Diagnosis not present

## 2023-10-22 DIAGNOSIS — R351 Nocturia: Secondary | ICD-10-CM | POA: Diagnosis not present

## 2023-10-28 ENCOUNTER — Encounter (HOSPITAL_BASED_OUTPATIENT_CLINIC_OR_DEPARTMENT_OTHER): Attending: Internal Medicine | Admitting: Internal Medicine

## 2023-10-28 DIAGNOSIS — X58XXXA Exposure to other specified factors, initial encounter: Secondary | ICD-10-CM | POA: Diagnosis not present

## 2023-10-28 DIAGNOSIS — I87321 Chronic venous hypertension (idiopathic) with inflammation of right lower extremity: Secondary | ICD-10-CM | POA: Insufficient documentation

## 2023-10-28 DIAGNOSIS — L97418 Non-pressure chronic ulcer of right heel and midfoot with other specified severity: Secondary | ICD-10-CM | POA: Diagnosis not present

## 2023-10-28 DIAGNOSIS — I87312 Chronic venous hypertension (idiopathic) with ulcer of left lower extremity: Secondary | ICD-10-CM | POA: Diagnosis not present

## 2023-10-28 DIAGNOSIS — S91302A Unspecified open wound, left foot, initial encounter: Secondary | ICD-10-CM | POA: Diagnosis not present

## 2023-10-28 DIAGNOSIS — S81802A Unspecified open wound, left lower leg, initial encounter: Secondary | ICD-10-CM | POA: Insufficient documentation

## 2023-10-28 DIAGNOSIS — L97412 Non-pressure chronic ulcer of right heel and midfoot with fat layer exposed: Secondary | ICD-10-CM | POA: Diagnosis not present

## 2023-10-28 DIAGNOSIS — L97522 Non-pressure chronic ulcer of other part of left foot with fat layer exposed: Secondary | ICD-10-CM | POA: Diagnosis not present

## 2023-10-28 DIAGNOSIS — E11621 Type 2 diabetes mellitus with foot ulcer: Secondary | ICD-10-CM | POA: Diagnosis not present

## 2023-10-29 ENCOUNTER — Emergency Department (HOSPITAL_COMMUNITY)

## 2023-10-29 ENCOUNTER — Encounter (HOSPITAL_COMMUNITY): Payer: Self-pay | Admitting: *Deleted

## 2023-10-29 ENCOUNTER — Inpatient Hospital Stay (HOSPITAL_COMMUNITY)
Admission: EM | Admit: 2023-10-29 | Discharge: 2023-11-01 | DRG: 299 | Disposition: A | Discharge status: Home / Self Care | Source: Ambulatory Visit | Attending: Internal Medicine | Admitting: Internal Medicine

## 2023-10-29 ENCOUNTER — Observation Stay (HOSPITAL_BASED_OUTPATIENT_CLINIC_OR_DEPARTMENT_OTHER)

## 2023-10-29 ENCOUNTER — Other Ambulatory Visit: Payer: Self-pay

## 2023-10-29 DIAGNOSIS — N1832 Chronic kidney disease, stage 3b: Secondary | ICD-10-CM | POA: Diagnosis present

## 2023-10-29 DIAGNOSIS — I428 Other cardiomyopathies: Secondary | ICD-10-CM | POA: Diagnosis present

## 2023-10-29 DIAGNOSIS — I480 Paroxysmal atrial fibrillation: Secondary | ICD-10-CM | POA: Diagnosis not present

## 2023-10-29 DIAGNOSIS — I110 Hypertensive heart disease with heart failure: Secondary | ICD-10-CM | POA: Diagnosis not present

## 2023-10-29 DIAGNOSIS — Z96612 Presence of left artificial shoulder joint: Secondary | ICD-10-CM | POA: Diagnosis not present

## 2023-10-29 DIAGNOSIS — I5043 Acute on chronic combined systolic (congestive) and diastolic (congestive) heart failure: Secondary | ICD-10-CM | POA: Diagnosis present

## 2023-10-29 DIAGNOSIS — L8990 Pressure ulcer of unspecified site, unspecified stage: Secondary | ICD-10-CM | POA: Insufficient documentation

## 2023-10-29 DIAGNOSIS — N179 Acute kidney failure, unspecified: Secondary | ICD-10-CM | POA: Diagnosis not present

## 2023-10-29 DIAGNOSIS — F411 Generalized anxiety disorder: Secondary | ICD-10-CM | POA: Diagnosis present

## 2023-10-29 DIAGNOSIS — E1151 Type 2 diabetes mellitus with diabetic peripheral angiopathy without gangrene: Secondary | ICD-10-CM | POA: Diagnosis present

## 2023-10-29 DIAGNOSIS — I509 Heart failure, unspecified: Secondary | ICD-10-CM | POA: Diagnosis not present

## 2023-10-29 DIAGNOSIS — R0602 Shortness of breath: Secondary | ICD-10-CM | POA: Diagnosis not present

## 2023-10-29 DIAGNOSIS — Z794 Long term (current) use of insulin: Secondary | ICD-10-CM | POA: Diagnosis not present

## 2023-10-29 DIAGNOSIS — E785 Hyperlipidemia, unspecified: Secondary | ICD-10-CM | POA: Diagnosis present

## 2023-10-29 DIAGNOSIS — I2510 Atherosclerotic heart disease of native coronary artery without angina pectoris: Secondary | ICD-10-CM | POA: Diagnosis present

## 2023-10-29 DIAGNOSIS — I130 Hypertensive heart and chronic kidney disease with heart failure and stage 1 through stage 4 chronic kidney disease, or unspecified chronic kidney disease: Secondary | ICD-10-CM | POA: Diagnosis present

## 2023-10-29 DIAGNOSIS — I5033 Acute on chronic diastolic (congestive) heart failure: Secondary | ICD-10-CM | POA: Diagnosis present

## 2023-10-29 DIAGNOSIS — I5021 Acute systolic (congestive) heart failure: Secondary | ICD-10-CM | POA: Diagnosis not present

## 2023-10-29 DIAGNOSIS — E1169 Type 2 diabetes mellitus with other specified complication: Secondary | ICD-10-CM | POA: Diagnosis present

## 2023-10-29 DIAGNOSIS — Z7901 Long term (current) use of anticoagulants: Secondary | ICD-10-CM | POA: Diagnosis not present

## 2023-10-29 DIAGNOSIS — I2583 Coronary atherosclerosis due to lipid rich plaque: Secondary | ICD-10-CM | POA: Diagnosis not present

## 2023-10-29 DIAGNOSIS — N401 Enlarged prostate with lower urinary tract symptoms: Secondary | ICD-10-CM | POA: Diagnosis not present

## 2023-10-29 DIAGNOSIS — E1159 Type 2 diabetes mellitus with other circulatory complications: Secondary | ICD-10-CM | POA: Diagnosis not present

## 2023-10-29 DIAGNOSIS — E119 Type 2 diabetes mellitus without complications: Secondary | ICD-10-CM

## 2023-10-29 DIAGNOSIS — E1140 Type 2 diabetes mellitus with diabetic neuropathy, unspecified: Secondary | ICD-10-CM | POA: Diagnosis present

## 2023-10-29 DIAGNOSIS — E1122 Type 2 diabetes mellitus with diabetic chronic kidney disease: Secondary | ICD-10-CM | POA: Diagnosis present

## 2023-10-29 DIAGNOSIS — K219 Gastro-esophageal reflux disease without esophagitis: Secondary | ICD-10-CM | POA: Diagnosis not present

## 2023-10-29 DIAGNOSIS — Z87891 Personal history of nicotine dependence: Secondary | ICD-10-CM

## 2023-10-29 DIAGNOSIS — I152 Hypertension secondary to endocrine disorders: Secondary | ICD-10-CM | POA: Diagnosis present

## 2023-10-29 DIAGNOSIS — E1129 Type 2 diabetes mellitus with other diabetic kidney complication: Secondary | ICD-10-CM | POA: Diagnosis not present

## 2023-10-29 HISTORY — DX: Chronic kidney disease, unspecified: N17.9

## 2023-10-29 HISTORY — DX: Acute kidney failure, unspecified: N17.9

## 2023-10-29 LAB — CBC
HCT: 32.7 % — ABNORMAL LOW (ref 39.0–52.0)
Hemoglobin: 9.8 g/dL — ABNORMAL LOW (ref 13.0–17.0)
MCH: 25.1 pg — ABNORMAL LOW (ref 26.0–34.0)
MCHC: 30 g/dL (ref 30.0–36.0)
MCV: 83.6 fL (ref 80.0–100.0)
Platelets: 154 10*3/uL (ref 150–400)
RBC: 3.91 MIL/uL — ABNORMAL LOW (ref 4.22–5.81)
RDW: 16.5 % — ABNORMAL HIGH (ref 11.5–15.5)
WBC: 5.4 10*3/uL (ref 4.0–10.5)
nRBC: 0 % (ref 0.0–0.2)

## 2023-10-29 LAB — BRAIN NATRIURETIC PEPTIDE: B Natriuretic Peptide: 646.5 pg/mL — ABNORMAL HIGH (ref 0.0–100.0)

## 2023-10-29 LAB — BASIC METABOLIC PANEL WITH GFR
Anion gap: 9 (ref 5–15)
BUN: 31 mg/dL — ABNORMAL HIGH (ref 8–23)
CO2: 25 mmol/L (ref 22–32)
Calcium: 7.7 mg/dL — ABNORMAL LOW (ref 8.9–10.3)
Chloride: 104 mmol/L (ref 98–111)
Creatinine, Ser: 1.99 mg/dL — ABNORMAL HIGH (ref 0.61–1.24)
GFR, Estimated: 32 mL/min — ABNORMAL LOW (ref >60)
Glucose, Bld: 194 mg/dL — ABNORMAL HIGH (ref 70–99)
Potassium: 3.5 mmol/L (ref 3.5–5.1)
Sodium: 138 mmol/L (ref 135–145)

## 2023-10-29 LAB — GLUCOSE, CAPILLARY
Glucose-Capillary: 124 mg/dL — ABNORMAL HIGH (ref 70–99)
Glucose-Capillary: 127 mg/dL — ABNORMAL HIGH (ref 70–99)

## 2023-10-29 LAB — TROPONIN I (HIGH SENSITIVITY)
Troponin I (High Sensitivity): 17 ng/L (ref <18)
Troponin I (High Sensitivity): 17 ng/L (ref <18)

## 2023-10-29 LAB — MAGNESIUM: Magnesium: 1.2 mg/dL — ABNORMAL LOW (ref 1.7–2.4)

## 2023-10-29 LAB — ECHOCARDIOGRAM COMPLETE
Area-P 1/2: 4.06 cm2
Est EF: 45
S' Lateral: 5.3 cm

## 2023-10-29 LAB — HEMOGLOBIN A1C
Hgb A1c MFr Bld: 6.9 % — ABNORMAL HIGH (ref 4.8–5.6)
Mean Plasma Glucose: 151.33 mg/dL

## 2023-10-29 MED ORDER — POLYETHYLENE GLYCOL 3350 17 G PO PACK: 17.0000 g | PACK | Freq: Every day | ORAL | Status: DC | PRN

## 2023-10-29 MED ORDER — INSULIN ASPART 100 UNIT/ML IJ SOLN: 0.0000 [IU] | Freq: Three times a day (TID) | INTRAMUSCULAR | Status: DC

## 2023-10-29 MED ORDER — ATORVASTATIN CALCIUM 40 MG PO TABS
40.0000 mg | ORAL_TABLET | Freq: Every day | ORAL | Status: DC
  Administered 2023-10-30 – 2023-11-01 (×3): 40 mg via ORAL
  Filled 2023-10-29 (×3): qty 1

## 2023-10-29 MED ORDER — PERFLUTREN LIPID MICROSPHERE
1.0000 mL | INTRAVENOUS | Status: AC | PRN
  Administered 2023-10-29: 3 mL via INTRAVENOUS

## 2023-10-29 MED ORDER — INSULIN ASPART 100 UNIT/ML IJ SOLN
0.0000 [IU] | Freq: Three times a day (TID) | INTRAMUSCULAR | Status: DC
  Administered 2023-10-29: 2 [IU] via SUBCUTANEOUS
  Administered 2023-10-30: 5 [IU] via SUBCUTANEOUS
  Administered 2023-10-31: 2 [IU] via SUBCUTANEOUS
  Administered 2023-10-31 (×2): 3 [IU] via SUBCUTANEOUS

## 2023-10-29 MED ORDER — PANTOPRAZOLE SODIUM 40 MG PO TBEC
40.0000 mg | DELAYED_RELEASE_TABLET | Freq: Every day | ORAL | Status: DC
  Administered 2023-10-30 – 2023-11-01 (×3): 40 mg via ORAL
  Filled 2023-10-29 (×3): qty 1

## 2023-10-29 MED ORDER — ACETAMINOPHEN 650 MG RE SUPP: 650.0000 mg | Freq: Four times a day (QID) | RECTAL | Status: DC | PRN

## 2023-10-29 MED ORDER — GABAPENTIN 300 MG PO CAPS
300.0000 mg | ORAL_CAPSULE | Freq: Two times a day (BID) | ORAL | Status: DC
  Administered 2023-10-29 – 2023-11-01 (×6): 300 mg via ORAL
  Filled 2023-10-29 (×6): qty 1

## 2023-10-29 MED ORDER — FUROSEMIDE 10 MG/ML IJ SOLN
40.0000 mg | Freq: Two times a day (BID) | INTRAMUSCULAR | Status: DC
  Administered 2023-10-29 – 2023-10-31 (×4): 40 mg via INTRAVENOUS
  Filled 2023-10-29 (×4): qty 4

## 2023-10-29 MED ORDER — ACETAMINOPHEN 325 MG PO TABS: 650.0000 mg | ORAL_TABLET | Freq: Four times a day (QID) | ORAL | Status: DC | PRN

## 2023-10-29 MED ORDER — FUROSEMIDE 10 MG/ML IJ SOLN
40.0000 mg | Freq: Once | INTRAMUSCULAR | Status: AC
  Administered 2023-10-29: 40 mg via INTRAVENOUS
  Filled 2023-10-29: qty 4

## 2023-10-29 MED ORDER — OXCARBAZEPINE 150 MG PO TABS
150.0000 mg | ORAL_TABLET | Freq: Two times a day (BID) | ORAL | Status: DC
  Administered 2023-10-29 – 2023-11-01 (×6): 150 mg via ORAL
  Filled 2023-10-29 (×8): qty 1

## 2023-10-29 MED ORDER — INSULIN GLARGINE-YFGN 100 UNIT/ML ~~LOC~~ SOLN
20.0000 [IU] | Freq: Every day | SUBCUTANEOUS | Status: DC
  Administered 2023-10-30 – 2023-11-01 (×3): 20 [IU] via SUBCUTANEOUS
  Filled 2023-10-29 (×3): qty 0.2

## 2023-10-29 MED ORDER — RIVAROXABAN 15 MG PO TABS
15.0000 mg | ORAL_TABLET | Freq: Every day | ORAL | Status: DC
  Administered 2023-10-30 – 2023-10-31 (×2): 15 mg via ORAL
  Filled 2023-10-29 (×2): qty 1

## 2023-10-29 MED ORDER — SODIUM CHLORIDE 0.9% FLUSH
3.0000 mL | Freq: Two times a day (BID) | INTRAVENOUS | Status: DC
  Administered 2023-10-29 – 2023-11-01 (×7): 3 mL via INTRAVENOUS

## 2023-10-29 MED ORDER — FINASTERIDE 5 MG PO TABS
5.0000 mg | ORAL_TABLET | Freq: Every day | ORAL | Status: DC
  Administered 2023-10-30 – 2023-11-01 (×3): 5 mg via ORAL
  Filled 2023-10-29 (×3): qty 1

## 2023-10-29 MED ORDER — MAGNESIUM SULFATE 2 GM/50ML IV SOLN
2.0000 g | Freq: Once | INTRAVENOUS | Status: AC
  Administered 2023-10-29: 2 g via INTRAVENOUS
  Filled 2023-10-29: qty 50

## 2023-10-29 MED ORDER — TAMSULOSIN HCL 0.4 MG PO CAPS
0.4000 mg | ORAL_CAPSULE | Freq: Two times a day (BID) | ORAL | Status: DC
  Administered 2023-10-29 – 2023-11-01 (×6): 0.4 mg via ORAL
  Filled 2023-10-29 (×6): qty 1

## 2023-10-29 NOTE — ED Triage Notes (Addendum)
 Pt has had sob x2 weeks.  No CP with this.  SOB is most in the am and then resolves over the day. Pt has had increasing swelling in both legs.  HX CHF and afib.   HERSH, MINNEY DOB: [birth year] (83 yo M) Acc No. [id number] DOS: 10/29/2023  Anita Barman 83 Y  old  Male, DOB: January 29, 1941 Account Number: [id number] [address] [place], [id number] Home: [phone]    Guarantor: Anita Barman    Insurance: [employer] CSNP Appointment Facility: [facility], [place] 10/29/2023 Katya Rolston W. Keaundre Thelin, MD Reason for Appointment   1.  Work-in Visit   2.  C/o: swelling in legs History of Present Illness    Patient History:  Here as a work in visit today for leg swelling.  Was seen by me last month, Cards in April.  Notes over the past week that both of his legs are swelling more and his Lasix  doesn't seem to be as effective.  When asked, he says he is taking the Farxiga , then a minute later says he is not taking.  I noted that I gave him 2 months of Jardiance samples for him to take in place of Farxiga  (no Farxiga  samples available last mont).  He called 3 days after I gave him 2 months worth needing "refills".  He also just recently resumed Xarelto  due to him talking about "that lady from [place]  took me off of it"  (prior Cards MD left, but he is confused on blood thinners vs heart meds.  Has R [place] in a [place] (can[place] provider).  His nephew drove him today.  WIfe Eveleen Hinds not reachable by phone. Current Medications Taking Jardiance 10 MG Tablet 1 tablet Orally Once a day , Notes to Pharmacist: samples as substitute for Farxiga  09/27/23 x 2 months FreeStyle Libre 2 Reader - Device as directed subq q14 days Atorvastatin  Calcium  40 MG Tablet TAKE 1 TABLET BY MOUTH EVERY DAY Finasteride  5 MG Tablet Take 1/2 tablet by mouth daily Oral Furosemide  40 MG Tablet Take 1 tablet po qd PRN for swelling Oral Gabapentin  300 MG Capsule 1 capsule Orally twice a day  , Notes to Pharmacist: Per Dr. Lydia Sams; reduced by nephrology Metoprolol  Succinate ER 50 MG Tablet Extended Release 24 Hour 1 tablet Orally Once a day OXcarbazepine  150 MG Tablet 1/2 tablet Orally Twice a day Tamsulosin  HCl 0.4 MG Capsule 1 tablet twice daily at bedtime Oral Toujeo  SoloStar 300 UNIT/ML Solution Pen-injector ADMINISTER 36 UNITS UNDER THE SKIN EVERY DAY Essential One Daily Multivit - Tablet take one a day Oral CoQ10 100 MG Capsule Take 1 capsule po qd Oral BD Pen Needle Nano 2nd Gen 32G X 4 MM Miscellaneous USE WITH INSULIN  INJECTION ONCE DAILY Xarelto  15 MG Tablet 1 tablet Orally Once a day , Notes to Pharmacist: Should be $47 copay - if any issues please call direct line [phone] FreeStyle Libre 2 Sensor - Miscellaneous USE UNDER THE SKIN EVERY 14 DAYS AS DIRECTED Medication List reviewed and reconciled with the patient Past Medical History DM II - 2002 , microalb 2014, insulin  11/2014. Hyperlipidemia. HTN. Tonna Frederic. CAD. Colon polyps. BPH, elevated PSA 2015 (5.1, 35% free) biopsy 02/2017. CHF - EF 30% on cath 8/04, improved on Echo 11/2013 (55%), grade 1 diastolic dysfunction 12/2014, [facility]. CrCl 51.9 3/06 (CKD 3), 34.8 09/2014. Trigeminal Neuralgia, early 1990's, Recurrence 04/2013, 2017, Patel with Neuro following. AFib + RVR, 08/2013, Xarelto , rate control, paroxysmal. Anemia, 2016, Magod, .  Thrombocytopenia. OA L spine. COVID: Unvaccinated. Surgical History Left shoulder fracture PAST SurgHX Till 02/09/2015 Lap Chole 12/2014, Lucienne Ryder PAST SurgHX Till 02/09/2015 Cataracts 2022 Family History Family History Summary: family history : Father deceased at 23 with heart Mother deceased at 47 with DM, heart 1 brother deceased - heart 1 brother deceased DM Son(s): Son Died of Esophageal CA 04/07/2020 Social History    Tobacco Use:  Tobacco use other than smoking  Are you an other tobacco user? No Tobacco Control (Standard)  Tobacco use: Former smoker    Social History  Summary:  Social History: alcohol  use : no , caffeine use, average drinks per day : 0 , chewing tobacco use : Former , cigar use : yes , cigarette use : yes , cigars, number smoked per week : 1 , drug use : no , exercise type : yard and house week , fall risk, geriatric safety issues : Low , oral tobacco use, year patient quit : 40-50 , passive cigarette smoke exposure : no , physical exercise, frequency, days per week : 1 , seatbelt usage : 100 , smoking history, total pack/year : 1 ppd , smoking status : Former smoker , smoking, year quit : 1980 , smoking/tobacco cessation, patient education and counseling : No , social history E&amp;M : Married to Greer (also a patient)& 3 children& 1 girl and 2 boys& HS& , year patient quit cigar use : 1980.    Drugs/Alcohol :  Drugs  Have you used drugs other than those for medical reasons in the past 12 months? No Caffeine  Intake: none Do you smoke marijuana?: Denies. Do you drink alcohol ?: No.    Household:  Household  Marital status: married Number of children in household: 3    Miscellaneous:  Sun Exposure: rarely. Seat Belt Use: 100%. Exercise: very little. Occupation: Retired.    Drug/Alcohol :  AUDIT-C (Standard)  Did you have a drink containing alcohol  in the past year? No Points 0 Interpretation Negative Allergies IMPRAMINE: hallucination/dizziness - Allergy - Onset Date 11/11/2014 IMIPRINE: hallucination/dizziness - Allergy - Onset Date 11/11/2014 Claritin: decreased urination - Allergy - Onset Date 03/09/2009 Hospitalization/Major Diagnostic Procedure Acute respiratory failure with hypoxia 07/23/2023-07/28/2023 Review of Systems    General / Constitutional:  Fever denies.  Headache denies.  Weight loss denies.     Allergy / Immunology:  Seasonal allergies denies.     Ophthalmologic:  Blurry vision denies.     ENT:  Nasal congestion denies.     Endocrine:  Excessive thirst denies.  Frequent urination denies.     Respiratory:  Cough  denies.  Shortness of breath denies.     Cardiovascular:  Chest pain denies.  Dyspnea on exertion denies.  Orthopnea denies.  Swelling in hands / feet admits.     Gastrointestinal:  Constipation denies.  Diarrhea denies.  Heartburn denies.     Hematology:  Easy bleeding denies.  Easy bruising denies.     Genitourinary:  Frequent urination denies.  Loss of urine with cough or laughter denies.  Painful urination denies.     Musculoskeletal:  Painful joints denies.     Peripheral Vascular:  Pain / cramping in legs after exertion denies.     Skin:  Rash denies.  Skin lesion(s) denies.     Neurologic:  Dizziness denies.  Tingling / numbness denies.     Psychiatric:  Anxiety denies.  Depressed mood denies.  Vital Signs Wt: 204.6 lbs, Ht: 69.0 in, BMI: 30.21 Index, WT CHG: 20 lbs, HR:  82 /min, BP: 120/60 mm Hg, Oxygen sat %: 94 %, Body Surface Area: 2.12, Wt-kg: 92.81 kg. Examination    General Examination: General appearance:  alert, pleasant, well-nourished and in no acute distress.  Head:  normocephalic, atraumatic.  Skin:  skin is warm and dry, with scattered senile purpura.  Heart:  regular rate and rhythm without murmurs, gallops, clicks or rubs.  Lungs:  coarse crackles B bases..  Musculoskeletal:  Full ROM in all joints with no joint swelling.  Extremities:  3+ edema B, R foot in CAM boot, mesh dressing B.  Peripheral pulses:  2+ dorsalis pedis.  Neurologic:  nonfocal.  Psych:  normal affect / mood.  Assessments 1. Heart failure, unspecified - I50.9    2. Paroxysmal atrial fibrillation - I48.0    3. Long term (current) use of anticoagulants - Z79.01    4. 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 Heart failure, unspecified          Notes: Clearly showing fluid retention. Concern for worsening kidney function as well. He clearly has no idea which meds he is actually taking and  has taken none this AM. Suggested that his nephew, who drove him, take him to the [facility] ER for admission as he will need Cards re-eval (2 years since last Echo) as well as IV lasix  and supervised medications. Has worked with our in [employer] on med cost and assistance but does not yet have home health coming in for med compliance.   2. Paroxysmal atrial fibrillation          Notes: Rate control. NSR today   3. Long term (current) use of anticoagulants          Notes: Xarelto . Noted that he has applied for assistance last year, but Charles Connor seemingly dropped the ball at [facility] with setting up his paperwork, so I have doneit on their behalf.Aaron Aas He is now getting assistance as we applied for it through our office and resumed meds this month.  He doesn't seem to have a primary cardiologist since Pemberton left and i s seeing a new NP each time, leading to continuity issues. Hopefully with [facility] new vascular center, this improves "patient ownership" following this upcoming hospital admission for CHF.   4. Chronic kidney disease, stage 3b          Notes: WIll be rechecked at hospital and meds adjusted appropriately.   5. Type 2 diabetes mellitus with other diabetic kidney complication         Continue FreeStyle Libre 2 Sensor Miscellaneous, -, as directed, subq, q14 days, 90 days, 6 Applicator, Refills 3       Continue FreeStyle Libre 2 Reader Device, -, as directed, subq, q14 days, 90 days, 1 Applicator, Refills 0       Notes: Reviewed A1C result, current meds. No changes to current medications. Acceptable level with age, prior lows on insulin . Should improve with resumed SGLT-2 as 2 months of Jardiance 10mg  samples given last month, but unclear if he is actually taking it.   Visit Codes 04540 Office Visit, Est Pt., Level 4. Follow Up as prior  Electronically signed by Malachi Screws , MD on 10/29/2023 at 09:21 AM EDT Sign off status: Completed

## 2023-10-29 NOTE — ED Provider Triage Note (Signed)
 Emergency Medicine Provider Triage Evaluation Note  Erik [name] , a 83 y.o. male  was evaluated in triage.  Pt says he was told to come immediately to the hospital by his doctor who he say today.  "He said it was my heart."  Patient denies chest pain, difficulty breathing, feels fine.    Physical Exam  BP (!) 136/51 (BP Location: Right Arm)   Pulse 72   Temp 98.2 F (36.8 C)   Resp 19   SpO2 97%  Gen:   Awake, no distress   Resp:  Normal effort  MSK:   Moves extremities without difficulty  Other:  HR normal  Medical Decision Making  Medically screening exam initiated at 10:11 AM.  Appropriate orders placed.  KINTE TRIM was informed that the remainder of the evaluation will be completed by another provider, this initial triage assessment does not replace that evaluation, and the importance of remaining in the ED until their evaluation is complete.  Patient here as referral from PCP Comfortable appearing and asymptomatic on exam  I am not able to review his office reviews from today for immediate reason for referral - no labs or note from PCP visible  Basic labs, xray, ecg ordered   Arvilla Birmingham, MD 10/29/23 1013

## 2023-10-29 NOTE — ED Provider Notes (Signed)
 [facility] EMERGENC[facility] DEPARTMENT AT [facility] Provider Note   CSN: [id number] Arrival date & time: 10/29/23  4098     History  Chief Complaint  Patient presents with   Shortness of Breath    Erik [name] is a 83 y.o. male.  Patient with history of CHF, CKD, CAD, diabetes, hypertension, hyperlipidemia, A-fib on Xarelto  presents today with complaints of bilateral lower extremity edema as well as shortness of breath.  Patient states he feels like his bilateral lower legs have been swelling over the last 2 weeks.  Notes that he has been taking his torsemide as prescribed and does not feel like he has had good enough urine output for the last few days.  States he feels fluid overloaded and is having some dyspnea on exertion as well.  Of note, patient is followed by wound care for his chronic wounds on his bilateral lower extremities.  He has casts in place on both sides.  States that these were placed a week ago.  Denies any fevers or chills.  He went to his PCP earlier today and reports they sent him here for IV diuresis and admission.  He denies any chest pain.   The history is provided by the patient. No language interpreter was used.  Shortness of Breath      Home Medications Prior to Admission medications   Medication Sig Start Date End Date Taking? Authorizing Provider  acetaminophen  (TYLENOL ) 325 MG tablet Take 2 tablets (650 mg total) by mouth every 6 (six) hours as needed for mild pain (or Fever >/= 101). 01/29/15   Elgergawy, Ardia Kraft, MD  atorvastatin  (LIPITOR ) 40 MG tablet Take 40 mg by mouth daily.    [provider]  Coenzyme Q10 (COQ10) 200 MG CAPS Take 100 mg by mouth daily.     [provider]  finasteride  (PROSCAR ) 5 MG tablet Take 5 mg by mouth daily. 03/06/17   [provider]  gabapentin  (NEURONTIN ) 300 MG capsule Take 1 capsule (300 mg total) by mouth 2 (two) times daily. 02/04/23   Patel, Donika K, DO  metoprolol  succinate  (TOPROL  XL) 50 MG 24 hr tablet Take 1 tablet (50 mg total) by mouth daily. Take with or immediately following a meal. Patient not taking: Reported on 07/25/2023 04/08/23   Swinyer, Leilani Punter, NP  Multiple Vitamin (MULTIVITAMIN) capsule Take 1 capsule by mouth daily.    [provider]  OXcarbazepine  (TRILEPTAL ) 150 MG tablet Take 1 tablet (150 mg total) by mouth 2 (two) times daily. 08/01/23   Patel, Donika K, DO  pantoprazole  (PROTONIX ) 40 MG tablet Take 40 mg by mouth daily.    [provider]  Rivaroxaban  (XARELTO ) 15 MG TABS tablet Take 1 tablet (15 mg total) by mouth daily with supper. Patient not taking: Reported on 07/25/2023 04/08/23   Swinyer, Leilani Punter, NP  Rivaroxaban  (XARELTO ) 15 MG TABS tablet Take 1 tablet (15 mg total) by mouth daily with supper. 08/28/23   Gerald Kitty., NP  selenium 200 MCG TABS tablet Take 200 mcg by mouth daily.    [provider]  tamsulosin  (FLOMAX ) 0.4 MG CAPS capsule Take 0.4 mg by mouth in the morning and at bedtime.    [provider]  TOUJEO  SOLOSTAR 300 UNIT/ML SOPN Inject 36 Units as directed daily.  03/12/16   [provider]      Allergies    Claritin [loratadine], Tradjenta [linagliptin], and Imipramine     Review of Systems  Review of Systems  Respiratory:  Positive for shortness of breath.   All other systems reviewed and are negative.   Physical Exam Updated Vital Signs BP (!) 137/53   Pulse 70   Temp 98.2 F (36.8 C)   Resp (!) 22   SpO2 98%  Physical Exam Vitals and nursing note reviewed.  Constitutional:      General: He is not in acute distress.    Appearance: Normal appearance. He is normal weight. He is not ill-appearing, toxic-appearing or diaphoretic.  HENT:     Head: Normocephalic and atraumatic.  Cardiovascular:     Rate and Rhythm: Normal rate and regular rhythm.     Heart sounds: Normal heart sounds.  Pulmonary:     Effort: Pulmonary effort is normal. No respiratory  distress.     Breath sounds: Normal breath sounds.  Abdominal:     Palpations: Abdomen is soft.     Tenderness: There is no abdominal tenderness.  Musculoskeletal:        General: Normal range of motion.     Cervical back: Normal range of motion.     Right lower leg: Edema present.     Left lower leg: Edema present.     Comments: Casting present on the BLE, unable to access DP and PT pulses or wounds. Does have pitting edema extending to the mid thigh bilaterally.   Skin:    General: Skin is warm and dry.  Neurological:     General: No focal deficit present.     Mental Status: He is alert.  Psychiatric:        Mood and Affect: Mood normal.        Behavior: Behavior normal.     ED Results / Procedures / Treatments   Labs (all labs ordered are listed, but only abnormal results are displayed) Labs Reviewed  BASIC METABOLIC PANEL WITH GFR - Abnormal; Notable for the following components:      Result Value   Glucose, Bld 194 (*)    BUN 31 (*)    Creatinine, Ser 1.99 (*)    Calcium  7.7 (*)    GFR, Estimated 32 (*)    All other components within normal limits  CBC - Abnormal; Notable for the following components:   RBC 3.91 (*)    Hemoglobin 9.8 (*)    HCT 32.7 (*)    MCH 25.1 (*)    RDW 16.5 (*)    All other components within normal limits  MAGNESIUM  - Abnormal; Notable for the following components:   Magnesium  1.2 (*)    All other components within normal limits  BRAIN NATRIURETIC PEPTIDE - Abnormal; Notable for the following components:   B Natriuretic Peptide 646.5 (*)    All other components within normal limits  HEMOGLOBIN A1C  TROPONIN I (HIGH SENSITIVITY)  TROPONIN I (HIGH SENSITIVITY)    EKG EKG Interpretation Date/Time:  Wednesday October 29 2023 10:04:58 EDT Ventricular Rate:  72 PR Interval:  220 QRS Duration:  98 QT Interval:  446 QTC Calculation: 488 R Axis:   -1  Text Interpretation: Atrial fibrillation with frequent PVC When compared with ECG of  23-Jul-2023 16:09, PREVIOUS ECG IS PRESENT Confirmed by Jerald Molly [phone]) on 10/29/2023 10:13:41 AM  Radiology DG Chest 2 View Result Date: 10/29/2023 CLINICAL DATA:  shortness of breath EXAM: CHEST - 2 VIEW COMPARISON:  07/26/2023 FINDINGS: Somewhat coarse pulmonary interstitial markings without focal infiltrate or overt edema. Heart size and mediastinal contours are within normal  limits. Aortic Atherosclerosis (ICD10-170.0). Chronic blunting of the left lateral costophrenic angle. Vertebral endplate spurring at multiple levels in the mid and lower thoracic spine. Prior left shoulder arthroplasty. IMPRESSION: 1. No acute findings. 2. Chronic blunting of the left lateral costophrenic angle. Electronically Signed   By: Nicoletta Barrier M.D.   On: 10/29/2023 11:04    Procedures Procedures    Medications Ordered in ED Medications  magnesium  sulfate IVPB 2 g 50 mL (0 g Intravenous Stopped 10/29/23 1257)  furosemide  (LASIX ) injection 40 mg (40 mg Intravenous Given 10/29/23 1239)    ED Course/ Medical Decision Making/ A&P                                 Medical Decision Making Amount and/or Complexity of Data Reviewed Labs: ordered. Radiology: ordered.  Risk Prescription drug management. Decision regarding hospitalization.   This patient is a 83 y.o. male who presents to the ED for concern of leg swelling, dyspnea on exertion, this involves an extensive number of treatment options, and is a complaint that carries with it a high risk of complications and morbidity. The emergent differential diagnosis prior to evaluation includes, but is not limited to,  CHF, pericardial effusion/tamponade, arrhythmias, ACS, COPD, asthma, bronchitis, pneumonia, pneumothorax, PE, anemia  This is not an exhaustive differential.   Past Medical History / Co-morbidities / Social History:  has a past medical history of Acute on chronic renal failure (HCC), Acute respiratory failure with hypoxia (HCC) (07/23/2023),  Chest pain (09/23/2013), Chronic combined systolic and diastolic CHF (congestive heart failure) (HCC), CKD (chronic kidney disease), stage III (HCC), Coronary artery disease, COVID-19 virus infection (07/23/2023), Diabetes mellitus, GERD (gastroesophageal reflux disease), Hyperlipidemia, Hypertension, NICM (nonischemic cardiomyopathy) (HCC), PAD (peripheral artery disease) (HCC), PAF (paroxysmal atrial fibrillation) (HCC), 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  Additional history: Chart reviewed. Pertinent results include: sent from pcp for admission due to concern of CHF exacerbation  Physical Exam: Physical exam performed. The pertinent findings include: BLE edema, speaking in complete sentences on room air  Lab Tests: I ordered, and personally interpreted labs.  The pertinent results include:  hgb 9.8 (down from 11.5 3 months ago), Creatinine 1.99 (up from 1.56 2 months ago). Troponin 17 --> 17. Magnesium  1.2, BNP 646   Imaging Studies: I ordered imaging studies including CXR. I independently visualized and interpreted imaging which showed   1. No acute findings. 2. Chronic blunting of the left lateral costophrenic angle.  I agree with the radiologist interpretation.   Cardiac Monitoring:  The patient was maintained on a cardiac monitor.  Cardiac monitor showed an underlying rhythm of: afib. I agree with this interpretation.   Medications: I ordered medication including lasix , IV magnesium   for fluid overload, hypomagnesemia. Reevaluation of the patient after these medicines showed that the patient stayed the same. I have reviewed the patients home medicines and have made adjustments as needed.  Disposition: After consideration of the diagnostic results  and the patients response to treatment, I feel that patient will require admission for concern for CHF exacerbation, AKI, hypomagnesemia.  Discussed same with patient who is understanding and agreement.  Discussed patient with hospitalist Dr. Melvin who accepts patient for admission.  Final Clinical Impression(s) / ED Diagnoses Final diagnoses:  Acute on  chronic congestive heart failure, unspecified heart failure type (HCC)  AKI (acute kidney injury) (HCC)  Hypomagnesemia    Rx / DC Orders ED Discharge Orders     None         Sherra Dk, PA-C 10/29/23 1552    Sallyanne Creamer, DO 11/03/23 1510

## 2023-10-29 NOTE — ED Notes (Signed)
Extra blue top drawn

## 2023-10-29 NOTE — ED Provider Notes (Signed)
 Note deleted after being opened in error. Please see note from PA Sarah Smoot which I have cosigned for 10/29/23 ED visit.   Sallyanne Creamer, DO 11/06/23 2229

## 2023-10-29 NOTE — H&P (Signed)
 History and Physical   Erik [name] GEX:[id number] DOB: [birth year] DOA: 10/29/2023  PCP: Suzzanne Estrin, MD   Patient coming from: Home/PCP  Chief Complaint: Shortness of breath, edema  HPI: Erik [name] is a 83 y.o. male with medical history significant of hypertension, hyperlipidemia, diabetes, GERD, CKD 3B, CAD, CHF, BPH, anxiety presenting with worsening edema and shortness of breath.  Patient has had about 2 weeks of worsening lower extremity edema. No significant SOB.  This has been despite taking his Lasix  that he was prescribed as needed.  Presented to his PCP and was recommended to come to the ED for further evaluation and likely IV diuresis.  Denies fevers, chills, chest pain, abdominal pain, constipation, diarrhea, nausea, vomiting.  PATIENT'S PCP, DR. Kandyce Ort, HAS COPIED NOTE INTO [facility] EMR FOR FURTHER REVIEW AND DETAIL   ED Course: Vital signs in ED notable for blood pressure in the 130s systolic, heart rate in the teens-20s.  Lab workup included BMP with BUN 31, creatinine stable at 1.99, glucose 194, calcium  7.7.  Magnesium came back at 1.2.  CBC with hemoglobin 9.8 which is below baseline of 11-12.  Troponin negative x 2.  BNP elevated at 646.  Chest x-ray with no acute abnormality but did show chronic left costophrenic angle blunting.  Patient received dose of Lasix  in the ED and as well as IV magnesium.  Review of Systems: As per HPI otherwise all other systems reviewed and are negative.  Past Medical History:  Diagnosis Date   Acute on chronic renal failure (HCC)    Acute respiratory failure with hypoxia (HCC) 07/23/2023   Chest pain 09/23/2013   Chronic combined systolic and diastolic CHF (congestive heart failure) (HCC)    CKD (chronic kidney disease), stage III (HCC)    Coronary artery disease    a. Nonobstructive by cath 2004. b. Nuc 09/2013 - no ischemia, fixed defect suggestive of possible prior infarction, felt low risk, EF 48%.    COVID-19 virus  infection 07/23/2023   Diabetes mellitus    GERD (gastroesophageal reflux disease)    Hyperlipidemia    Hypertension    NICM (nonischemic cardiomyopathy) (HCC)    a. EF 35-40% in 2009, 55% in 2010. b. 09/2013: 35-40% by echo and 48% by nuc. c. EF 50-55% by echo 12/2014.   PAD (peripheral artery disease) (HCC)    a. mild by noninvasive testing.   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    Peripheral arterial disease (HCC)    Premature ventricular contractions    Thrombocytopenia (HCC)    Trigeminal neuralgia    Type 2 diabetes mellitus with chronic kidney disease, without long-term current use of insulin  (HCC) 12/02/2012    Past Surgical History:  Procedure Laterality Date   CARDIAC CATHETERIZATION  10/25/00, 12/29/02   CHOLECYSTECTOMY N/A 01/25/2015   Procedure: LAPAROSCOPIC CHOLECYSTECTOMY ;  Surgeon: Oza Blumenthal, MD;  Location: [facility] OR;  Service: General;  Laterality: N/A;   SHOULDER SURGERY Left 2011   Shoulder surgery, rod inserted    Social History  reports that he quit smoking about 38 years ago. His smoking use included cigarettes. He started smoking about 48 years ago. He has a 5 pack-year smoking history. He has never used smokeless tobacco. He reports that he does not drink alcohol  and does not use drugs.  Allergies  Allergen Reactions   Claritin [Loratadine] Other (See Comments)    Urinary retention   Tradjenta [Linagliptin] Other (See  Comments)    High blood sugar   Imipramine  Other (See Comments)    Other reaction(s): hallucination/dizziness    Family History  Problem Relation Age of Onset   Heart disease Mother    Diabetes Mother    Hypertension Mother    Heart disease Father    Heart disease Brother    Diabetes Brother    Hypertension Brother    Heart attack Neg Hx    Stroke Neg Hx   Reviewed on admission  Prior to Admission medications   Medication Sig Start Date End Date Taking? Authorizing Provider   acetaminophen  (TYLENOL ) 325 MG tablet Take 2 tablets (650 mg total) by mouth every 6 (six) hours as needed for mild pain (or Fever >/= 101). 01/29/15   Elgergawy, Ardia Kraft, MD  atorvastatin  (LIPITOR ) 40 MG tablet Take 40 mg by mouth daily.    [provider]  Coenzyme Q10 (COQ10) 200 MG CAPS Take 100 mg by mouth daily.     [provider]  finasteride  (PROSCAR ) 5 MG tablet Take 5 mg by mouth daily. 03/06/17   [provider]  gabapentin  (NEURONTIN ) 300 MG capsule Take 1 capsule (300 mg total) by mouth 2 (two) times daily. 02/04/23   Patel, Donika K, DO  metoprolol  succinate (TOPROL  XL) 50 MG 24 hr tablet Take 1 tablet (50 mg total) by mouth daily. Take with or immediately following a meal. Patient not taking: Reported on 07/25/2023 04/08/23   Swinyer, Leilani Punter, NP  Multiple Vitamin (MULTIVITAMIN) capsule Take 1 capsule by mouth daily.    [provider]  OXcarbazepine  (TRILEPTAL ) 150 MG tablet Take 1 tablet (150 mg total) by mouth 2 (two) times daily. 08/01/23   Patel, Donika K, DO  pantoprazole  (PROTONIX ) 40 MG tablet Take 40 mg by mouth daily.    [provider]  Rivaroxaban  (XARELTO ) 15 MG TABS tablet Take 1 tablet (15 mg total) by mouth daily with supper. Patient not taking: Reported on 07/25/2023 04/08/23   Swinyer, Leilani Punter, NP  Rivaroxaban  (XARELTO ) 15 MG TABS tablet Take 1 tablet (15 mg total) by mouth daily with supper. 08/28/23   Gerald Kitty., NP  selenium 200 MCG TABS tablet Take 200 mcg by mouth daily.    [provider]  tamsulosin  (FLOMAX ) 0.4 MG CAPS capsule Take 0.4 mg by mouth in the morning and at bedtime.    [provider]  TOUJEO  SOLOSTAR 300 UNIT/ML SOPN Inject 36 Units as directed daily.  03/12/16   [provider]    Physical Exam: Vitals:   10/29/23 1215 10/29/23 1224 10/29/23 1330 10/29/23 1345  BP: (!) 137/53  (!) 155/58   Pulse: 69 70 64   Resp: (!) 28 (!) 22 (!) 23   Temp:    98.4 F (36.9  C)  TempSrc:    Oral  SpO2: 97% 98% 96%     Physical Exam Constitutional:      General: He is not in acute distress.    Appearance: Normal appearance.  HENT:     Head: Normocephalic and atraumatic.     Mouth/Throat:     Mouth: Mucous membranes are moist.     Pharynx: Oropharynx is clear.  Eyes:     Extraocular Movements: Extraocular movements intact.     Pupils: Pupils are equal, round, and reactive to light.  Cardiovascular:     Rate and Rhythm: Normal rate and regular rhythm.     Pulses: Normal pulses.  Heart sounds: Normal heart sounds.  Pulmonary:     Effort: Pulmonary effort is normal. No respiratory distress.     Breath sounds: Normal breath sounds.  Abdominal:     General: Bowel sounds are normal. There is no distension.     Palpations: Abdomen is soft.     Tenderness: There is no abdominal tenderness.  Musculoskeletal:        General: No swelling or deformity.     Right lower leg: Edema (Boot on) present.     Left lower leg: Edema present.     Comments: Bilat LE wounds wrapped  Skin:    General: Skin is warm and dry.  Neurological:     General: No focal deficit present.     Mental Status: Mental status is at baseline.    Labs on Admission: I have personally reviewed following labs and imaging studies  CBC: Recent Labs  Lab 10/29/23 1013  WBC 5.4  HGB 9.8*  HCT 32.7*  MCV 83.6  PLT 154    Basic Metabolic Panel: Recent Labs  Lab 10/29/23 1013  NA 138  K 3.5  CL 104  CO2 25  GLUCOSE 194*  BUN 31*  CREATININE 1.99*  CALCIUM  7.7*  MG 1.2*    GFR: CrCl cannot be calculated (Unknown ideal weight.).  Liver Function Tests: No results for input(s): "AST", "ALT", "ALKPHOS", "BILITOT", "PROT", "ALBUMIN " in the last 168 hours.  Urine analysis:    Component Value Date/Time   COLORURINE YELLOW 01/22/2015 1126   APPEARANCEUR CLEAR 01/22/2015 1126   LABSPEC 1.013 01/22/2015 1126   PHURINE 5.0 01/22/2015 1126   GLUCOSEU >1000 (A) 01/22/2015  1126   HGBUR NEGATIVE 01/22/2015 1126   BILIRUBINUR NEGATIVE 01/22/2015 1126   KETONESUR NEGATIVE 01/22/2015 1126   PROTEINUR NEGATIVE 01/22/2015 1126   UROBILINOGEN 0.2 01/22/2015 1126   NITRITE NEGATIVE 01/22/2015 1126   LEUKOCYTESUR NEGATIVE 01/22/2015 1126    Radiological Exams on Admission: DG Chest 2 View Result Date: 10/29/2023 CLINICAL DATA:  shortness of breath EXAM: CHEST - 2 VIEW COMPARISON:  07/26/2023 FINDINGS: Somewhat coarse pulmonary interstitial markings without focal infiltrate or overt edema. Heart size and mediastinal contours are within normal limits. Aortic Atherosclerosis (ICD10-170.0). Chronic blunting of the left lateral costophrenic angle. Vertebral endplate spurring at multiple levels in the mid and lower thoracic spine. Prior left shoulder arthroplasty. IMPRESSION: 1. No acute findings. 2. Chronic blunting of the left lateral costophrenic angle. Electronically Signed   By: Nicoletta Barrier M.D.   On: 10/29/2023 11:04    EKG: Independently reviewed.  Atrial fibrillation with rate of 72 bpm.  Frequent PVCs.  Nonspecific T wave changes.  QTc borderline at 488.  Assessment/Plan Principal Problem:   Acute on chronic combined systolic (congestive) and diastolic (congestive) heart failure (HCC) Active Problems:   Chronic heart failure with mildly reduced ejection fraction (HFmrEF, 41-49%) (HCC)   Type 2 diabetes mellitus (HCC)   Chronic kidney disease, stage 3b (HCC)   Paroxysmal atrial fibrillation (HCC)   Coronary artery disease   Hypertension associated with diabetes (HCC)   GERD (gastroesophageal reflux disease)   Hyperlipidemia associated with type 2 diabetes mellitus (HCC)   Benign prostatic hyperplasia with lower urinary tract symptoms   Generalized anxiety disorder   Acute on chronic combined systolic and diastolic CHF > As per HPI presenting with worsening lower extremity edema for 2 weeks and now worsening shortness of breath. > Last echo was in 2023 with EF  45-50%, G1 DD, normal RV function. >  BNP in the ED was 646.  Magnesium 1.2 which has been repleted.  Chest x-ray looked okay.  Significant edema on exam. > Received 40 mg IV Lasix  in the ED. - Monitor on telemetry overnight - Continue with Lasix  40 mg IV twice daily - Strict I's and O's, daily weights - Echocardiogram - Trend renal function and electrolytes  Hypertension - Continue home metoprolol  - Lasix  as above  Hyperlipidemia - Continue home atorvastatin   Diabetes > 36 units daily outpatient - 20 units daily - SSI  GERD - Continue home PPI  CKD 3B > Creatinine stable in the ED at 1.99 - Trend renal function and electrolytes  CAD - Continue home atorvastatin , metoprolol   Atrial fibrillation - Continue home Xarelto , metoprolol   Neuropathy - Continue home Trileptal , Gabapentin   BPH - Continue home tamsulosin , finasteride    DVT prophylaxis: Xarelto  Code Status:   Full Family Communication:  None on admission  Disposition Plan:   Patient is from:  Home  Anticipated DC to:  Home  Anticipated DC date:  1 to 4 days  Anticipated DC barriers: None  Consults called:  None Admission status:  Observation, telemetry  Severity of Illness: The appropriate patient status for this patient is OBSERVATION. Observation status is judged to be reasonable and necessary in order to provide the required intensity of service to ensure the patient's safety. The patient's presenting symptoms, physical exam findings, and initial radiographic and laboratory data in the context of their medical condition is felt to place them at decreased risk for further clinical deterioration. Furthermore, it is anticipated that the patient will be medically stable for discharge from the hospital within 2 midnights of admission.    Johnetta Nab MD [facility]  How to contact the [facility] Attending or Consulting provider 7A - 7P or covering provider during after hours 7P -7A, for this patient?    Check the care team in [facility] and look for a) attending/consulting [facility] provider listed and b) the [facility] team listed Log into [email or web address] and use [facility] universal password to access. If you do not have the password, please contact the hospital operator. Locate the [facility] provider you are looking for under [facility] and page to a number that you can be directly reached. If you still have difficulty reaching the provider, please page the [facility] (Director on Call) for the Hospitalists listed on amion for assistance.  10/29/2023, 1:59 PM

## 2023-10-30 ENCOUNTER — Encounter (HOSPITAL_COMMUNITY): Payer: Self-pay | Admitting: Internal Medicine

## 2023-10-30 DIAGNOSIS — Z794 Long term (current) use of insulin: Secondary | ICD-10-CM | POA: Diagnosis not present

## 2023-10-30 DIAGNOSIS — I5043 Acute on chronic combined systolic (congestive) and diastolic (congestive) heart failure: Secondary | ICD-10-CM | POA: Diagnosis not present

## 2023-10-30 DIAGNOSIS — I5033 Acute on chronic diastolic (congestive) heart failure: Secondary | ICD-10-CM | POA: Diagnosis present

## 2023-10-30 DIAGNOSIS — E1169 Type 2 diabetes mellitus with other specified complication: Secondary | ICD-10-CM | POA: Diagnosis not present

## 2023-10-30 DIAGNOSIS — E1151 Type 2 diabetes mellitus with diabetic peripheral angiopathy without gangrene: Secondary | ICD-10-CM | POA: Diagnosis not present

## 2023-10-30 DIAGNOSIS — I480 Paroxysmal atrial fibrillation: Secondary | ICD-10-CM | POA: Diagnosis not present

## 2023-10-30 DIAGNOSIS — K219 Gastro-esophageal reflux disease without esophagitis: Secondary | ICD-10-CM | POA: Diagnosis not present

## 2023-10-30 DIAGNOSIS — I428 Other cardiomyopathies: Secondary | ICD-10-CM | POA: Diagnosis not present

## 2023-10-30 DIAGNOSIS — Z87891 Personal history of nicotine dependence: Secondary | ICD-10-CM | POA: Diagnosis not present

## 2023-10-30 DIAGNOSIS — E785 Hyperlipidemia, unspecified: Secondary | ICD-10-CM | POA: Diagnosis not present

## 2023-10-30 DIAGNOSIS — L8990 Pressure ulcer of unspecified site, unspecified stage: Secondary | ICD-10-CM | POA: Diagnosis not present

## 2023-10-30 DIAGNOSIS — Z7901 Long term (current) use of anticoagulants: Secondary | ICD-10-CM | POA: Diagnosis not present

## 2023-10-30 DIAGNOSIS — I509 Heart failure, unspecified: Secondary | ICD-10-CM | POA: Diagnosis not present

## 2023-10-30 DIAGNOSIS — I110 Hypertensive heart disease with heart failure: Secondary | ICD-10-CM | POA: Diagnosis not present

## 2023-10-30 DIAGNOSIS — E1159 Type 2 diabetes mellitus with other circulatory complications: Secondary | ICD-10-CM | POA: Diagnosis not present

## 2023-10-30 DIAGNOSIS — I2510 Atherosclerotic heart disease of native coronary artery without angina pectoris: Secondary | ICD-10-CM | POA: Diagnosis not present

## 2023-10-30 DIAGNOSIS — E1140 Type 2 diabetes mellitus with diabetic neuropathy, unspecified: Secondary | ICD-10-CM | POA: Diagnosis not present

## 2023-10-30 DIAGNOSIS — E1122 Type 2 diabetes mellitus with diabetic chronic kidney disease: Secondary | ICD-10-CM | POA: Diagnosis not present

## 2023-10-30 DIAGNOSIS — N179 Acute kidney failure, unspecified: Secondary | ICD-10-CM | POA: Diagnosis not present

## 2023-10-30 DIAGNOSIS — I152 Hypertension secondary to endocrine disorders: Secondary | ICD-10-CM | POA: Diagnosis not present

## 2023-10-30 DIAGNOSIS — I130 Hypertensive heart and chronic kidney disease with heart failure and stage 1 through stage 4 chronic kidney disease, or unspecified chronic kidney disease: Secondary | ICD-10-CM | POA: Diagnosis not present

## 2023-10-30 DIAGNOSIS — R0602 Shortness of breath: Secondary | ICD-10-CM | POA: Diagnosis not present

## 2023-10-30 DIAGNOSIS — N1832 Chronic kidney disease, stage 3b: Secondary | ICD-10-CM | POA: Diagnosis not present

## 2023-10-30 DIAGNOSIS — Z96612 Presence of left artificial shoulder joint: Secondary | ICD-10-CM | POA: Diagnosis not present

## 2023-10-30 DIAGNOSIS — N401 Enlarged prostate with lower urinary tract symptoms: Secondary | ICD-10-CM | POA: Diagnosis not present

## 2023-10-30 DIAGNOSIS — F411 Generalized anxiety disorder: Secondary | ICD-10-CM | POA: Diagnosis not present

## 2023-10-30 LAB — CBC
HCT: 31.5 % — ABNORMAL LOW (ref 39.0–52.0)
Hemoglobin: 9.6 g/dL — ABNORMAL LOW (ref 13.0–17.0)
MCH: 25.7 pg — ABNORMAL LOW (ref 26.0–34.0)
MCHC: 30.5 g/dL (ref 30.0–36.0)
MCV: 84.2 fL (ref 80.0–100.0)
Platelets: 122 10*3/uL — ABNORMAL LOW (ref 150–400)
RBC: 3.74 MIL/uL — ABNORMAL LOW (ref 4.22–5.81)
RDW: 16.6 % — ABNORMAL HIGH (ref 11.5–15.5)
WBC: 5.3 10*3/uL (ref 4.0–10.5)
nRBC: 0 % (ref 0.0–0.2)

## 2023-10-30 LAB — COMPREHENSIVE METABOLIC PANEL WITH GFR
ALT: 7 U/L (ref 0–44)
AST: 13 U/L — ABNORMAL LOW (ref 15–41)
Albumin: 2.7 g/dL — ABNORMAL LOW (ref 3.5–5.0)
Alkaline Phosphatase: 65 U/L (ref 38–126)
Anion gap: 11 (ref 5–15)
BUN: 32 mg/dL — ABNORMAL HIGH (ref 8–23)
CO2: 26 mmol/L (ref 22–32)
Calcium: 7.7 mg/dL — ABNORMAL LOW (ref 8.9–10.3)
Chloride: 102 mmol/L (ref 98–111)
Creatinine, Ser: 1.98 mg/dL — ABNORMAL HIGH (ref 0.61–1.24)
GFR, Estimated: 33 mL/min — ABNORMAL LOW (ref >60)
Glucose, Bld: 168 mg/dL — ABNORMAL HIGH (ref 70–99)
Potassium: 3.3 mmol/L — ABNORMAL LOW (ref 3.5–5.1)
Sodium: 139 mmol/L (ref 135–145)
Total Bilirubin: 0.6 mg/dL (ref 0.0–1.2)
Total Protein: 5.5 g/dL — ABNORMAL LOW (ref 6.5–8.1)

## 2023-10-30 LAB — MAGNESIUM: Magnesium: 1.6 mg/dL — ABNORMAL LOW (ref 1.7–2.4)

## 2023-10-30 LAB — GLUCOSE, CAPILLARY
Glucose-Capillary: 110 mg/dL — ABNORMAL HIGH (ref 70–99)
Glucose-Capillary: 220 mg/dL — ABNORMAL HIGH (ref 70–99)
Glucose-Capillary: 112 mg/dL — ABNORMAL HIGH (ref 70–99)
Glucose-Capillary: 170 mg/dL — ABNORMAL HIGH (ref 70–99)

## 2023-10-30 IMAGING — DX DG FOOT COMPLETE 3+V*R*
3 series · 3 of 3 positions shown · non-contrast
Comparison: None.

CLINICAL DATA: Ulceration first metatarsal, erythema, swelling

EXAM:
RIGHT FOOT COMPLETE - 3+ VIEW

[foot ap]
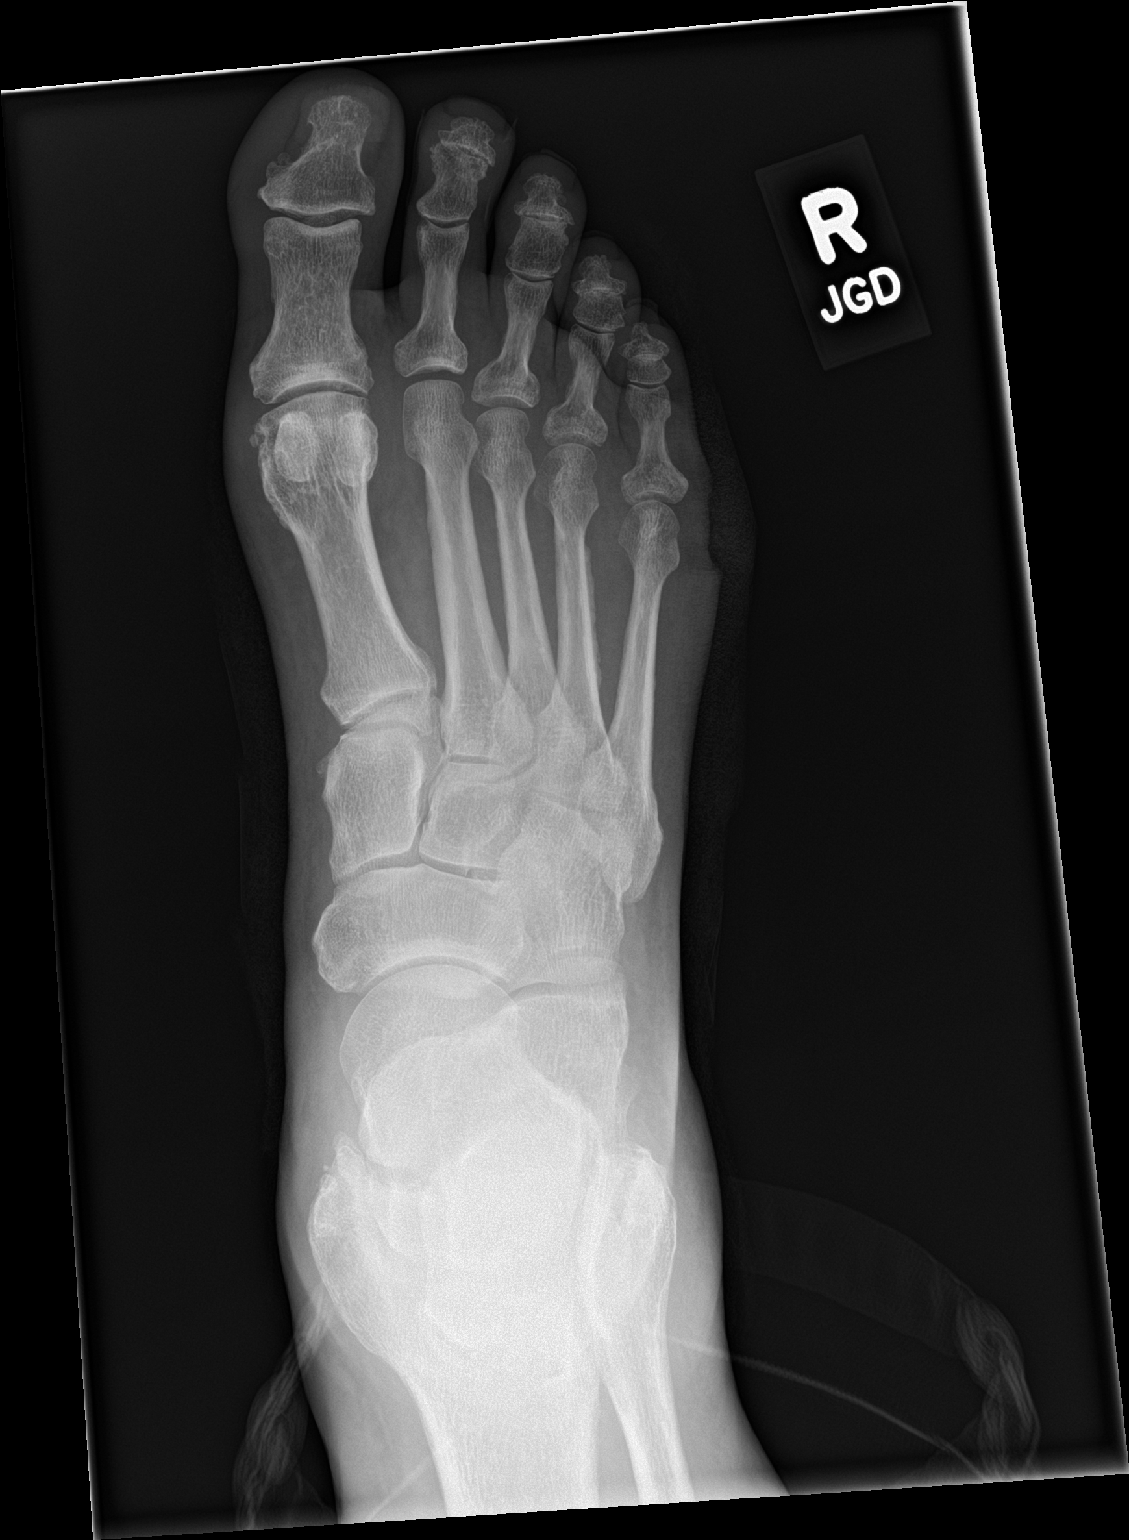

[foot obl]
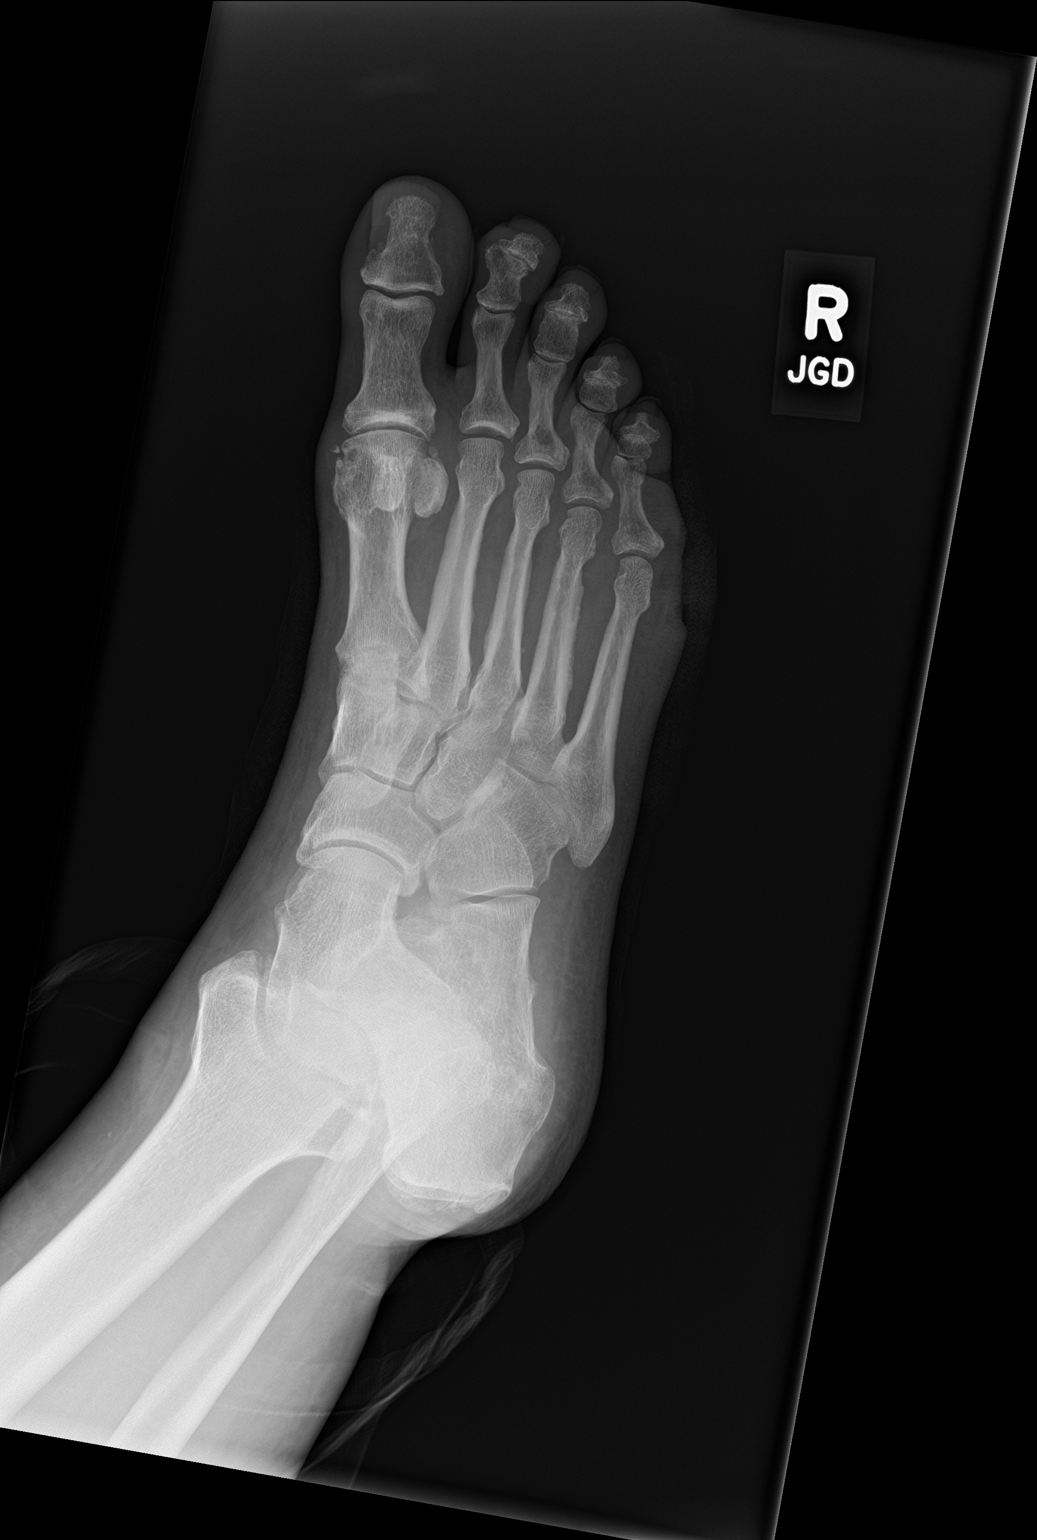

[foot lat]
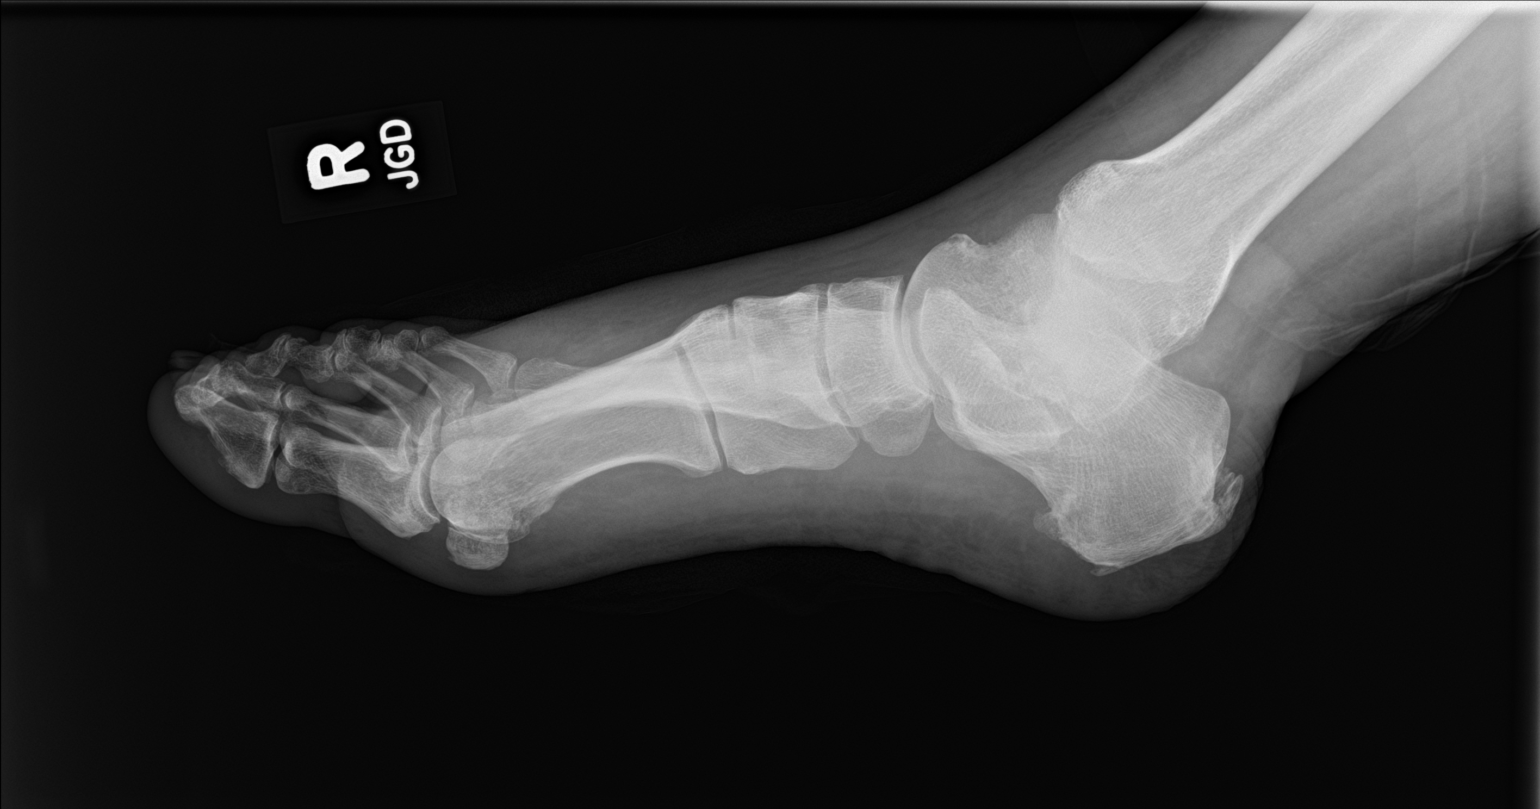

[3 of 3 positions shown; findings below may reference images not displayed]

FINDINGS: Frontal, oblique, and lateral views of the right foot are obtained.
Diffuse soft tissue swelling throughout the right foot, with large
ulceration overlying the fifth metatarsophalangeal joint. No
evidence of subcutaneous gas or radiopaque foreign body. No bony
destruction or periosteal reaction to suggest osteomyelitis. Mild
osteoarthritis greatest at the first metatarsophalangeal joint.
Moderate superior and inferior calcaneal spurs.
IMPRESSION: 1. Diffuse soft tissue edema, with ulceration overlying the fifth
metatarsophalangeal joint.
2. No acute or destructive bony lesions. No evidence of
osteomyelitis.

## 2023-10-30 MED ORDER — POTASSIUM CHLORIDE CRYS ER 20 MEQ PO TBCR
30.0000 meq | EXTENDED_RELEASE_TABLET | Freq: Four times a day (QID) | ORAL | Status: AC
  Administered 2023-10-30 (×2): 30 meq via ORAL
  Filled 2023-10-30 (×2): qty 1

## 2023-10-30 MED ORDER — MUPIROCIN 2 % EX OINT
TOPICAL_OINTMENT | Freq: Every day | CUTANEOUS | Status: DC
  Filled 2023-10-30: qty 22

## 2023-10-30 NOTE — Evaluation (Signed)
 Physical Therapy Evaluation Patient Details Name: Erik [name] MRN: [id number] DOB: May 13, 1941 Today's Date: 10/30/2023  History of Present Illness  Pt is 83 yo presenting [facility] ED on 6/4 due to worsening edema and shortness of breathe. PMH: HTN, HLD, DM, GERD, CKD III, CAD, CHF, BPH, anxiety.  Clinical Impression  Pt is presenting close to baseline level of functioning. Currently pt is supervision for safety for sit to stand and gait with RW. Pt reports 3 falls over the past 6 months. Pt lives with spouse and step son who are able to assist though spouse is not in good health. Due to pt current functional status, home set up and available assistance at home recommending skilled physical therapy services 3x/week in order to address strength, balance and functional mobility to decrease risk for falls, injury and re-hospitalization. Currently pt is presenting close to baseline level of functioning. Pt will be discharged from skilled physical therapy services at this time; please re-consult if further needs arise.              If plan is discharge home, recommend the following: Help with stairs or ramp for entrance;Assistance with cooking/housework;Assist for transportation     Equipment Recommendations None recommended by PT     Functional Status Assessment Patient has had a recent decline in their functional status and demonstrates the ability to make significant improvements in function in a reasonable and predictable amount of time.     Precautions / Restrictions Precautions Precautions: Fall Recall of Precautions/Restrictions: Impaired Precaution/Restrictions Comments: poor safety awareness Restrictions Weight Bearing Restrictions Per Provider Order: No      Mobility  Bed Mobility Overal bed mobility: Modified Independent          Transfers Overall transfer level: Needs assistance Equipment used: Rolling walker (2 wheels) Transfers: Sit to/from Stand Sit to Stand:  Supervision           General transfer comment: supervision for safety    Ambulation/Gait Ambulation/Gait assistance: Supervision Gait Distance (Feet): 200 Feet Assistive device: Rolling walker (2 wheels) Gait Pattern/deviations: Step-through pattern, Decreased stride length Gait velocity: decreased Gait velocity interpretation: 1.31 - 2.62 ft/sec, indicative of limited community ambulator   General Gait Details: occasional verbal cues for body proximity to RW for safety to prevent uncontrolled anterior progression of RW  Stairs Stairs:  (pt educated on navigating one step per home set up with RW)              Balance Overall balance assessment: Mild deficits observed, not formally tested, Modified Independent, History of Falls           Pertinent Vitals/Pain Pain Assessment Pain Assessment: No/denies pain    Home Living Family/patient expects to be discharged to:: Private residence Living Arrangements: Spouse/significant other;Other relatives (step son) Available Help at Discharge: Family Type of Home: House Home Access: Level entry;Stairs to enter Entrance Stairs-Rails: None Entrance Stairs-Number of Steps: 1   Home Layout: One level Home Equipment: Cane - single Librarian, academic (2 wheels);Rollator (4 wheels)      Prior Function Prior Level of Function : Independent/Modified Independent;Driving             Mobility Comments: uses cane in house, but times used RW in community ADLs Comments: Ind with ADL"s and IADL"s. Has 12 cats he feeds     Extremity/Trunk Assessment   Upper Extremity Assessment Upper Extremity Assessment: Overall WFL for tasks assessed;Generalized weakness    Lower Extremity Assessment Lower Extremity Assessment: Overall [facility]  for tasks assessed;Generalized weakness    Cervical / Trunk Assessment Cervical / Trunk Assessment: Normal  Communication   Communication Communication: Impaired Factors Affecting  Communication: Hearing impaired    Cognition Arousal: Alert Behavior During Therapy: WFL for tasks assessed/performed   PT - Cognitive impairments: No apparent impairments, Safety/Judgement       PT - Cognition Comments: poor safety awareness Following commands: Intact       Cueing Cueing Techniques: Verbal cues     General Comments General comments (skin integrity, edema, etc.): Pt has scratches all over his back from his cats jumping on his back. Scratches are healed; no redness noted.        Assessment/Plan    PT Assessment All further PT needs can be met in the next venue of care  PT Problem List Decreased balance;Decreased mobility;Decreased activity tolerance           PT Goals (Current goals can be found in the Care Plan section)  Acute Rehab PT Goals Patient Stated Goal: to return home and take care of himself PT Goal Formulation: With patient Time For Goal Achievement: 11/13/23 Potential to Achieve Goals: Good            AM-PAC PT "6 Clicks" Mobility  Outcome Measure Help needed turning from your back to your side while in a flat bed without using bedrails?: None Help needed moving from lying on your back to sitting on the side of a flat bed without using bedrails?: None Help needed moving to and from a bed to a chair (including a wheelchair)?: A Little Help needed standing up from a chair using your arms (e.g., wheelchair or bedside chair)?: A Little Help needed to walk in hospital room?: A Little Help needed climbing 3-5 steps with a railing? : A Little 6 Click Score: 20    End of Session Equipment Utilized During Treatment: Gait belt Activity Tolerance: Patient tolerated treatment well Patient left: in bed;with call bell/phone within reach;with bed alarm set Nurse Communication: Mobility status PT Visit Diagnosis: Unsteadiness on feet (R26.81);Muscle weakness (generalized) (M62.81);History of falling (Z91.81)    Time: 2130-8657 PT Time  Calculation (min) (ACUTE ONLY): 23 min   Charges:   PT Evaluation $PT Eval Low Complexity: 1 Low PT Treatments $Therapeutic Activity: 8-22 mins PT General Charges $$ ACUTE PT VISIT: 1 Visit       Sloan Duncans, DPT, CLT  Acute Rehabilitation Services Office: [phone] (Secure chat preferred)   Jenice Mitts 10/30/2023, 12:25 PM

## 2023-10-30 NOTE — Progress Notes (Signed)
 Mobility Specialist Progress Note:   10/30/23 0935  Mobility  Activity Ambulated with assistance in hallway  Level of Assistance Contact guard assist, steadying assist  Assistive Device None  Distance Ambulated (ft) 200 ft  Activity Response Tolerated well  Mobility Referral Yes  Mobility visit 1 Mobility  Mobility Specialist Start Time (ACUTE ONLY) 0935  Mobility Specialist Stop Time (ACUTE ONLY) 0945  Mobility Specialist Time Calculation (min) (ACUTE ONLY) 10 min   Pt agreeable to mobility session. Required only minG assist d/t slight unsteadiness caused by BLE swelling. Pt able to ambulate independently in room. SpO2 90-95% on RA throughout. Left with all needs met.   Oneda Big Mobility Specialist Please contact via SecureChat or  Rehab office at [phone]

## 2023-10-30 NOTE — Progress Notes (Signed)
 Transition of Care [facility]) - Inpatient Brief Assessment   Patient Details  Name: Erik [name] MRN: [id number] Date of Birth: 08-19-1940  [facility] CM/SW Contact:    Dane Dung, RN Phone Number: 10/30/2023, 3:02 PM   Clinical Narrative: Patient admitted to the hospital for heart failure and lower extremity edema.  Patient plans to return home with his wife and step-son at the home.  DME at the home includes bilateral CAM walker boots, Freestyle BS monitoring, Cane, RW.    Patient is active with [facility] for lower extremity wounds.    Patient was seen by HF RN today and is set up for HF clinic.  Patient declines HH services for PT.  No other TOC needs at this time.  Patient should return home with family when stable for discharge.   Transition of Care Asessment: Insurance and Status: (P) Insurance coverage has been reviewed Patient has primary care physician: (P) Yes Home environment has been reviewed: (P) from home with spouse Prior level of function:: (P) RW, bilateral lower extremity CAM walker boots Prior/Current Home Services: (P) No current home services (sees [facility] for bil lower extremity wounds) Social Drivers of Health Review: (P) SDOH reviewed interventions complete   Transition of care needs: (P) no transition of care needs at this time

## 2023-10-30 NOTE — Consult Note (Signed)
 WOC Nurse Consult Note: patient is followed at [facility] for wounds to B lower extremities; using hydrofera blue for L leg which is not on formulary at [facility]; will substitute Silver  Has total contact cast to R lower leg with wounds noted R 5th plantar head and R plantar calcaneus; last TCC placed 10/21/23 patient was scheduled for return for replacement casting in one week (10/28/2023)  Reason for Consult: B lower extremity wounds  Wound type: Full thickness L great toe, L medial foot diabetic ulcers; L anterior lower leg ? Venous insufficiency vs edema  R plantar heel and R 5th plantar digit diabetic ulcers  Pressure Injury POA: NA not pressure  Measurement: see nursing flowsheet; per [facility] 5/27 L great toe 0.5 cm x 0.8 cm x 0.1 cm, L medial foot 1 cm x 1 cm x 0.1 cm, R 5th plantar head 0.4 cm x 0.4cm x 0.1 cm; R plantar calcaneus/heel 0.4 cm x 1.6 cm x 0.2 cm  Wound bed: Drainage (amount, consistency, odor)  Periwound: Dressing procedure/placement/frequency:   Cleanse L lower leg and foot wounds with NS, apply Bactroban ointment to all wound beds, cover with silver  hydrofiber (Aquacel AG Timm Foot #[id number]) cut to fit wound beds and apply daily. Cover with ABD pads and secure with Kerlix roll gauze beginning right above toes and ending right below knee.  Apply Coban or Ace bandage wrapped in same fashion as Kerlix for light compression.   Ortho tech will need to come and cut off total contact casting. After removal of TCC cleanse all wounds to R foot (5th metatarsal and R plantar heel) with NS, apply Bactroban ointment and silver  hydrofiber Timm Foot [phone]) to wound beds, cover with dry gauze and secure with Kerlix roll gauze.  Will discuss above with primary MD and if agrees will consult ortho to come and cut off current TCC.  Total contact casting will need to be replaced by wound care center at next visit.  Patient should continue with wound care center orders at discharge.   POC discussed with bedside  nurse.  Thank you,    Ronni Colace MSN, RN-BC, [employer] [phone]

## 2023-10-30 NOTE — Progress Notes (Signed)
 Progress Note   Patient: Erik [name] QMV:[id number] DOB: 1940-07-13 DOA: 10/29/2023     0 DOS: the patient was seen and examined on 10/30/2023   Brief hospital course: Erik [name] is a 83 y.o. male with medical history significant of hypertension, hyperlipidemia, diabetes, GERD, CKD 3B, CAD, CHF, BPH, anxiety presenting with worsening leg edema and shortness of breath. He is admitted to [facility] service ofr further management of CHF exacerbation.  Assessment and Plan: Acute on chronic combined systolic and diastolic CHF He has worsening lower extremity edema for 2 weeks and now worsening shortness of breath.  BNP in the ED was 646.  Magnesium 1.2 which has been repleted.  Chest x-ray  unremarkable.  Significant edema on exam. Continue with Lasix  40 mg IV twice daily. Strict I's and O's, daily weights Echocardiogram showed EF 45%, reduced right ventricular systolic function. Will get Cardiology evaluation. Trend renal function and electrolytes.   Hypertension Continue home metoprolol    Hyperlipidemia Continue home atorvastatin    Diabetes Blood sugars stable. Continue Lantus  20 units daily and adjust accordingly. Accuchecks, sliding scale insulin .   GERD Continue home PPI   CKD 3B Creatinine stable and at baseline. Trend renal function and electrolytes as he is on diuretic.   CAD Continue home atorvastatin , metoprolol    Atrial fibrillation Continue home Xarelto , metoprolol    Neuropathy Continue home Trileptal , Gabapentin    BPH Continue home tamsulosin , finasteride       Out of bed to chair. Incentive spirometry. Nursing supportive care. Fall, aspiration precautions. Diet:  Diet Orders (From admission, onward)     Start     Ordered   10/29/23 1350  Diet Heart Room service appropriate? Yes; Fluid consistency: Thin; Fluid restriction: 1800 mL Fluid  Diet effective now       Question Answer Comment  Room service appropriate? Yes   Fluid consistency: Thin   Fluid  restriction: 1800 mL Fluid      10/29/23 1355           DVT prophylaxis:  Rivaroxaban  (XARELTO ) tablet 15 mg  Level of care: Telemetry Cardiac   Code Status: Full Code  Subjective: Patient is seen and examined today morning. He is sitting on edge of bed. States he feels better, bilateral legs wrapped, has b/l wounds wrapped. Able to ambulate with PT.  Physical Exam: Vitals:   10/30/23 0032 10/30/23 0437 10/30/23 1110 10/30/23 1515  BP: (!) 133/52 (!) 123/51 (!) 121/48 107/66  Pulse: 69 (!) 57 62 71  Resp: 20 20 18 18   Temp: 97.6 F (36.4 C) 97.6 F (36.4 C) 98 F (36.7 C) 97.7 F (36.5 C)  TempSrc: Oral Axillary Oral Oral  SpO2: 95% 92% 94% 92%  Weight:  91 kg    Height:        General - Elderly Caucasian male, no apparent distress HEENT - PERRLA, EOMI, atraumatic head, non tender sinuses. Lung - Clear, basal rales, diffuse rhonchi, no wheezes. Heart - S1, S2 heard, no murmurs, rubs, 3+ pedal edema. Abdomen - Soft, non tender, bowel sounds good Neuro - Alert, awake and oriented x 3, non focal exam. Skin - Warm and dry. Bilateral leg wounds wrapped.  Data Reviewed:      Latest Ref Rng & Units 10/30/2023    2:19 AM 10/29/2023   10:13 AM 07/28/2023    6:46 AM  CBC  WBC 4.0 - 10.5 K/uL 5.3  5.4  6.6   Hemoglobin 13.0 - 17.0 g/dL 9.6  9.8  11.5  Hematocrit 39.0 - 52.0 % 31.5  32.7  37.6   Platelets 150 - 400 K/uL 122  154  123       Latest Ref Rng & Units 10/30/2023    2:19 AM 10/29/2023   10:13 AM 08/28/2023    3:39 PM  BMP  Glucose 70 - 99 mg/dL 161  096  045   BUN 8 - 23 mg/dL 32  31  26   Creatinine 0.61 - 1.24 mg/dL 4.09  8.11  9.14   BUN/Creat Ratio 10 - 24   17   Sodium 135 - 145 mmol/L 139  138  137   Potassium 3.5 - 5.1 mmol/L 3.3  3.5  4.5   Chloride 98 - 111 mmol/L 102  104  96   CO2 22 - 32 mmol/L 26  25  25    Calcium  8.9 - 10.3 mg/dL 7.7  7.7  8.6    ECHOCARDIOGRAM COMPLETE Result Date: 10/29/2023    ECHOCARDIOGRAM REPORT   Patient Name:   Erik [name] Date of Exam: 10/29/2023 Medical Rec #:  [id number]       Height:       71.0 in Accession #:    [id number]      Weight:       171.2 lb Date of Birth:  [birth year]        BSA:          1.974 m Patient Age:    82 years        BP:           120/72 mmHg Patient Gender: M               HR:           66 bpm. Exam Location:  Inpatient Procedure: 2D Echo, Cardiac Doppler, Color Doppler and Intracardiac            Opacification Agent (Both Spectral and Color Flow Doppler were            utilized during procedure). Indications:    I50.21 CHF  History:        Patient has no prior history of Echocardiogram examinations,                 most recent 09/19/2021. Cardiomyopathy, CAD; Risk                 Factors:Diabetes, Hypertension and Dyslipidemia. LVEF 50% GLS                 -17.9.  Sonographer:    Donnita Gales [facility], RDCS Referring Phys: [id number] Gayl Katos [facility]  Sonographer Comments: Technically difficult study due to poor echo windows. IMPRESSIONS  1. 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 EF estimated at 45%.. Left ventricular ejection fraction, by estimation, is 45%. The left ventricle has mildly decreased function. The left ventricle demonstrates global hypokinesis. The left ventricular internal cavity size was mildly dilated. Left ventricular diastolic function could not be evaluated.  2. Right ventricular systolic function is mildly reduced. The right ventricular size is normal.  3. Left atrial size was mildly dilated.  4. The mitral valve is normal in structure. Trivial mitral valve regurgitation. No evidence of mitral stenosis.  5. The aortic valve is tricuspid. There is mild calcification of the aortic valve. Aortic valve regurgitation is not visualized. No aortic stenosis is present. Conclusion(s)/Recommendation(s): LV not seen well. EF estimated at 45% but would consider TEE or cardiac MRI  to further evalaute, if clinically indicated. FINDINGS  Left Ventricle: LV endocardium not seen well  even with Definity contrast. EF estimated at 45%. Left ventricular ejection fraction, by estimation, is 45%. The left ventricle has mildly decreased function. The left ventricle demonstrates global hypokinesis. Definity contrast agent was given IV to delineate the left ventricular endocardial borders. The left ventricular internal cavity size was mildly dilated. There is no left ventricular hypertrophy. Left ventricular diastolic function could not  be evaluated due to atrial fibrillation. Left ventricular diastolic function could not be evaluated. Right Ventricle: The right ventricular size is normal. No increase in right ventricular wall thickness. Right ventricular systolic function is mildly reduced. Left Atrium: Left atrial size was mildly dilated. Right Atrium: Right atrial size was normal in size. Pericardium: There is no evidence of pericardial effusion. Mitral Valve: The mitral valve is normal in structure. Trivial mitral valve regurgitation. No evidence of mitral valve stenosis. Tricuspid Valve: The tricuspid valve is normal in structure. Tricuspid valve regurgitation is not demonstrated. No evidence of tricuspid stenosis. Aortic Valve: The aortic valve is tricuspid. There is mild calcification of the aortic valve. Aortic valve regurgitation is not visualized. No aortic stenosis is present. Pulmonic Valve: The pulmonic valve was normal in structure. Pulmonic valve regurgitation is not visualized. No evidence of pulmonic stenosis. Aorta: The aortic root is normal in size and structure. Venous: The inferior vena cava was not well visualized. IAS/Shunts: No atrial level shunt detected by color flow Doppler.  LEFT VENTRICLE PLAX 2D LVIDd:         5.80 cm   Diastology LVIDs:         5.30 cm   LV e' medial:    4.35 cm/s LV PW:         0.90 cm   LV E/e' medial:  22.6 LV IVS:        0.80 cm   LV e' lateral:   5.55 cm/s LVOT diam:     2.20 cm   LV E/e' lateral: 17.7 LV SV:         56 LV SV Index:   29 LVOT Area:      3.80 cm  RIGHT VENTRICLE RV Basal diam:  4.40 cm RV Mid diam:    3.30 cm RV S prime:     9.46 cm/s TAPSE (M-mode): 2.0 cm RVSP:           46.6 mmHg LEFT ATRIUM             Index        RIGHT ATRIUM           Index LA diam:        4.40 cm 2.23 cm/m   RA Pressure: 3.00 mmHg LA Vol (A2C):   49.9 ml 25.28 ml/m  RA Area:     18.80 cm LA Vol (A4C):   53.8 ml 27.28 ml/m  RA Volume:   49.70 ml  25.18 ml/m LA Biplane Vol: 54.2 ml 27.46 ml/m  AORTIC VALVE LVOT Vmax:   67.20 cm/s LVOT Vmean:  47.300 cm/s LVOT VTI:    0.148 m  AORTA Ao Root diam: 3.20 cm Ao Asc diam:  3.00 cm MITRAL VALVE               TRICUSPID VALVE MV Area (PHT): 4.06 cm    TR Peak grad:   43.6 mmHg MV Decel Time: 187 msec    TR Vmax:        330.00  cm/s MV E velocity: 98.10 cm/s  Estimated RAP:  3.00 mmHg MV A velocity: 33.40 cm/s  RVSP:           46.6 mmHg MV E/A ratio:  2.94                            SHUNTS                            Systemic VTI:  0.15 m                            Systemic Diam: 2.20 cm Jules Oar MD Electronically signed by Jules Oar MD Signature Date/Time: 10/29/2023/7:05:46 PM    Final    DG Chest 2 View Result Date: 10/29/2023 CLINICAL DATA:  shortness of breath EXAM: CHEST - 2 VIEW COMPARISON:  07/26/2023 FINDINGS: Somewhat coarse pulmonary interstitial markings without focal infiltrate or overt edema. Heart size and mediastinal contours are within normal limits. Aortic Atherosclerosis (ICD10-170.0). Chronic blunting of the left lateral costophrenic angle. Vertebral endplate spurring at multiple levels in the mid and lower thoracic spine. Prior left shoulder arthroplasty. IMPRESSION: 1. No acute findings. 2. Chronic blunting of the left lateral costophrenic angle. Electronically Signed   By: Nicoletta Barrier M.D.   On: 10/29/2023 11:04    Family Communication: Discussed with patient, understand and agree. All questions answered.  Disposition: Status is: Inpatient Remains inpatient appropriate because: IV  diuresis, PT eval  Planned Discharge Destination: Home with Home Health     Time spent: 41 minutes  Author: Aisha Hove, MD 10/30/2023 6:03 PM Secure chat 7am to 7pm For on call review [email or web address].

## 2023-10-30 NOTE — Progress Notes (Signed)
 Heart Failure Nurse Navigator Progress Note  PCP: Tisovec, Kristina Pfeiffer, MD PCP-Cardiologist: Viva Grise Admission Diagnosis: Acute on chronic heart failure, AKI, Hypomagnesemia.  Admitted from: Home  Presentation:   Erik [name] presented with complaints of bilateral lower extremity edema and shortness of breath over the last 2 weeks. Patient reported to taking his medication as prescribed, however felt his urine output had decreased. Patient see's wound care for chronic leg wounds and has casts on both lower legs for this . BP 137/53, HR 70, BNP 646, CBG 194, EKG showed Atrial Fibrillation with frequent PVC in 06/2023.   Patient was educated on the sign and symptoms of heart failure, daily weights, when to call his doctor or go to the ED. Diet/ fluid restrictions , taking all medications as prescribed and attending all medical appointments. Patient verbalized his understanding of education, a HF TOC appointment was scheduled for 11/05/2023 @ 11:45 am.   ECHO/ LVEF: 45%  Clinical Course:  Past Medical History:  Diagnosis Date   Acute on chronic renal failure (HCC)    Acute respiratory failure with hypoxia (HCC) 07/23/2023   Chest pain 09/23/2013   Chronic combined systolic and diastolic CHF (congestive heart failure) (HCC)    CKD (chronic kidney disease), stage III (HCC)    Coronary artery disease    a. Nonobstructive by cath 2004. b. Nuc 09/2013 - no ischemia, fixed defect suggestive of possible prior infarction, felt low risk, EF 48%.    COVID-19 virus infection 07/23/2023   Diabetes mellitus    GERD (gastroesophageal reflux disease)    Hyperlipidemia    Hypertension    NICM (nonischemic cardiomyopathy) (HCC)    a. EF 35-40% in 2009, 55% in 2010. b. 09/2013: 35-40% by echo and 48% by nuc. c. EF 50-55% by echo 12/2014.   PAD (peripheral artery disease) (HCC)    a. mild by noninvasive testing.   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     Peripheral arterial disease (HCC)    Premature ventricular contractions    Thrombocytopenia (HCC)    Trigeminal neuralgia    Type 2 diabetes mellitus with chronic kidney disease, without long-term current use of insulin  (HCC) 12/02/2012     Social History   Socioeconomic History   Marital status: Married    Spouse name: Not on file   Number of children: Not on file   Years of education: Not on file   Highest education level: Not on file  Occupational History   Occupation: retired    Comment: from maintenance  Tobacco Use   Smoking status: Former    Current packs/day: 0.00    Average packs/day: 0.5 packs/day for 10.0 years (5.0 ttl pk-yrs)    Types: Cigarettes    Start date: 01/22/1975    Quit date: 01/21/1985    Years since quitting: 38.7   Smokeless tobacco: Never   Tobacco comments:    quit 35 yrs ago  Vaping Use   Vaping status: Never Used  Substance and Sexual Activity   Alcohol  use: No   Drug use: No   Sexual activity: Not Currently    Birth control/protection: None  Other Topics Concern   Not on file  Social History Narrative   Lives with wife in a one story home.  Has 3 children.  Retired from IT trainer at [employer].     Education: high school.      Right Handed    Lives in  a one story    Social Drivers of Corporate investment banker Strain: Not on file  Food Insecurity: No Food Insecurity (10/29/2023)   Hunger Vital Sign    Worried About Running Out of Food in the Last Year: Never true    Ran Out of Food in the Last Year: Never true  Transportation Needs: No Transportation Needs (10/29/2023)   PRAPARE - Administrator, Civil Service (Medical): No    Lack of Transportation (Non-Medical): No  Physical Activity: Not on file  Stress: Not on file  Social Connections: Moderately Integrated (10/29/2023)   Social Connection and Isolation Panel [NHANES]    Frequency of Communication with Friends and Family: Once a week    Frequency of  Social Gatherings with Friends and Family: Once a week    Attends Religious Services: 1 to 4 times per year    Active Member of [employer] or Organizations: No    Attends Engineer, structural: 1 to 4 times per year    Marital Status: Married   Water engineer and Provision:  Detailed education and instructions provided on heart failure disease management including the following:  Signs and symptoms of Heart Failure When to call the physician Importance of daily weights Low sodium diet Fluid restriction Medication management Anticipated future follow-up appointments  Patient education given on each of the above topics.  Patient acknowledges understanding via teach back method and acceptance of all instructions.  Education Materials:  "Living Better With Heart Failure" Booklet, HF zone tool, & Daily Weight Tracker Tool.  Patient has scale at home: Yes Patient has pill box at home: Yes    High Risk Criteria for Readmission and/or Poor Patient Outcomes: Heart failure hospital admissions (last 6 months): 1  No Show rate: 4 %  Difficult social situation: No, lives with his wife.  Demonstrates medication adherence: Yes Primary Language: English Literacy level: Reading, writing, and comprehension   Barriers of Care:   Diet/ fluid restrictions Daily weights  Considerations/Referrals:   Referral made to Heart Failure Pharmacist Stewardship: Yes Referral made to Heart Failure CSW/NCM TOC: No Referral made to Heart & Vascular TOC clinic: Yes, 11/05/2023 @ 11:45 am.   Items for Follow-up on DC/TOC: Continued HF education Diet/ fluid restrictions Daily weights   Randie Bustle, BSN, RN Heart Failure Teacher, adult education Only

## 2023-10-30 NOTE — Progress Notes (Signed)
 Orthopedic Tech Progress Note Patient Details:  Erik [name] 01/03/1941 [id number]  Total contact cast to his RLE was removed as instructed in the note by Pattie Borders, WOC RN this morning.  Casting Type of Cast: Other (comment) (total contact cast) Cast Location: RLE Cast Material: Fiberglass Cast Intervention: Removal  Post Interventions Patient Tolerated: Well   [name] [name] 10/30/2023, 1:18 PM

## 2023-10-31 ENCOUNTER — Other Ambulatory Visit (HOSPITAL_COMMUNITY): Payer: Self-pay

## 2023-10-31 ENCOUNTER — Telehealth (HOSPITAL_COMMUNITY): Payer: Self-pay

## 2023-10-31 DIAGNOSIS — F411 Generalized anxiety disorder: Secondary | ICD-10-CM | POA: Diagnosis not present

## 2023-10-31 DIAGNOSIS — I5043 Acute on chronic combined systolic (congestive) and diastolic (congestive) heart failure: Secondary | ICD-10-CM | POA: Diagnosis not present

## 2023-10-31 DIAGNOSIS — E1169 Type 2 diabetes mellitus with other specified complication: Secondary | ICD-10-CM | POA: Diagnosis not present

## 2023-10-31 DIAGNOSIS — E1159 Type 2 diabetes mellitus with other circulatory complications: Secondary | ICD-10-CM | POA: Diagnosis not present

## 2023-10-31 LAB — CBC
HCT: 31.3 % — ABNORMAL LOW (ref 39.0–52.0)
Hemoglobin: 9.5 g/dL — ABNORMAL LOW (ref 13.0–17.0)
MCH: 25.1 pg — ABNORMAL LOW (ref 26.0–34.0)
MCHC: 30.4 g/dL (ref 30.0–36.0)
MCV: 82.8 fL (ref 80.0–100.0)
Platelets: 148 10*3/uL — ABNORMAL LOW (ref 150–400)
RBC: 3.78 MIL/uL — ABNORMAL LOW (ref 4.22–5.81)
RDW: 16.2 % — ABNORMAL HIGH (ref 11.5–15.5)
WBC: 5.6 10*3/uL (ref 4.0–10.5)
nRBC: 0 % (ref 0.0–0.2)

## 2023-10-31 LAB — BASIC METABOLIC PANEL WITH GFR
Anion gap: 11 (ref 5–15)
BUN: 29 mg/dL — ABNORMAL HIGH (ref 8–23)
CO2: 31 mmol/L (ref 22–32)
Calcium: 7.7 mg/dL — ABNORMAL LOW (ref 8.9–10.3)
Chloride: 98 mmol/L (ref 98–111)
Creatinine, Ser: 2.02 mg/dL — ABNORMAL HIGH (ref 0.61–1.24)
GFR, Estimated: 32 mL/min — ABNORMAL LOW (ref >60)
Glucose, Bld: 77 mg/dL (ref 70–99)
Potassium: 3.7 mmol/L (ref 3.5–5.1)
Sodium: 140 mmol/L (ref 135–145)

## 2023-10-31 LAB — GLUCOSE, CAPILLARY
Glucose-Capillary: 138 mg/dL — ABNORMAL HIGH (ref 70–99)
Glucose-Capillary: 151 mg/dL — ABNORMAL HIGH (ref 70–99)
Glucose-Capillary: 138 mg/dL — ABNORMAL HIGH (ref 70–99)
Glucose-Capillary: 161 mg/dL — ABNORMAL HIGH (ref 70–99)
Glucose-Capillary: 156 mg/dL — ABNORMAL HIGH (ref 70–99)

## 2023-10-31 MED ORDER — MAGNESIUM SULFATE 4 GM/100ML IV SOLN
4.0000 g | Freq: Once | INTRAVENOUS | Status: AC
  Administered 2023-10-31: 4 g via INTRAVENOUS
  Filled 2023-10-31: qty 100

## 2023-10-31 MED ORDER — FUROSEMIDE 10 MG/ML IJ SOLN
40.0000 mg | Freq: Every day | INTRAMUSCULAR | Status: DC
  Administered 2023-11-01: 40 mg via INTRAVENOUS
  Filled 2023-10-31: qty 4

## 2023-10-31 NOTE — Progress Notes (Signed)
 Mobility Specialist Progress Note:   10/31/23 0935  Mobility  Activity Ambulated with assistance in hallway  Level of Assistance Contact guard assist, steadying assist  Assistive Device None  Distance Ambulated (ft) 200 ft  Activity Response Tolerated well  Mobility Referral Yes  Mobility visit 1 Mobility  Mobility Specialist Start Time (ACUTE ONLY) 0935  Mobility Specialist Stop Time (ACUTE ONLY) 0950  Mobility Specialist Time Calculation (min) (ACUTE ONLY) 15 min   Pt agreeable to mobility session. Required contact assist for safety d/t minor unsteadiness. C/o BLE soreness throughout, back sitting EOB with all needs met.  Oneda Big Mobility Specialist Please contact via SecureChat or  Rehab office at [phone]

## 2023-10-31 NOTE — Telephone Encounter (Signed)
 Advanced Heart Failure Patient Advocate Encounter  The patient was approved for a [employer] grant that will help cover the cost of Farxiga , Metoprolol .  Total amount awarded, $4,500.  Effective: 10/01/2023 - 09/29/2024.  BIN [id number] PCN [id number] [id number] ID [id number]  Pharmacy provided with approval and processing information. Both listed phone numbers for pt are not in service at this time, unable to leave a message.  Kennis Peacock, CPhT Rx Patient Advocate Phone: [phone]

## 2023-10-31 NOTE — Progress Notes (Signed)
   Heart Failure Stewardship Pharmacist Progress Note   PCP: Tisovec, Kristina Pfeiffer, MD PCP-Cardiologist: None    HPI:  74 YOM with PMH of HTN, HLD, DM, GERD, CKD, CAD, CHF, BPH, anxiety.  Presented with worsening lower extremity edema x 2 weeks. ECHO shows LVEF 45% with global hypokinesis, LV diameter mildly dilated. Noted that LV function could not be visualized well, recommending TEE or cardiac MRI. Previous ECHO 08/2021 showed LVEF 45-50% with global hypokinesis, grade I diastolic dysfunction.  Denies SOB at rest, but does endorse SOB while walking the halls. Patient still volume overloaded, significant edema present.   Current HF Medications: Diuretic: furosemide  40 mg IV BID  Prior to admission HF Medications: Diuretic: furosemide  40 mg daily Beta blocker: metoprolol  50 mg daily - patient reports taking 75 mg daily SGLT2i: Farxiga  10 mg daily  Pertinent Lab Values: Serum creatinine 2.02, BUN 29, Potassium 3.7, Sodium 140, BNP 646.5, Magnesium 1.6 , A1c 6.9  Vital Signs: Weight: 194 lbs (admission weight: 201 lbs) Blood pressure: 110-130/50's  Heart rate: 60 (56-66)  I/O: net -1.1L yesterday; net -2.3L since admission  Medication Assistance / Insurance Benefits Check: Does the patient have prescription insurance?  Yes Type of insurance plan: [employer]  Does the patient qualify for medication assistance through manufacturers or grants?   Yes Eligible grants and/or patient assistance programs: Farxiga  Medication assistance applications in progress: Farxiga   Medication assistance applications approved: None  Approved medication assistance renewals will be completed by: [facility]  Outpatient Pharmacy:  Prior to admission outpatient pharmacy: [facility] [place]  Is the patient willing to use [facility] TOC pharmacy at discharge? Yes Is the patient willing to transition their outpatient pharmacy to utilize a [facility] outpatient pharmacy?   No    Assessment: 1. Acute on  chronic systolic CHF (LVEF 45%), likely due to NICM. NYHA class III symptoms. - Continue furosemide  40 mg IV BID  - Continue to monitor  - Daily weights. Strict I/O's.  - Keep K > 4, Mg >2   Plan: 1) Medication changes recommended at this time: - Recommend restarting home Farxiga  10 mg daily - pending improvement in Scr - Recommend 40 mEq K x 1  - Recommend 4 g Mg x 1  2) Patient assistance: - Pt unable to afford Farxiga  co-pay, obtaining grant to help cover cost.   3)  Education  - To be completed prior to discharge  Winnie Haver, PharmD Candidate [address] [facility] [employer] 10/31/2023 1:35 PM

## 2023-10-31 NOTE — Evaluation (Addendum)
 Occupational Therapy Evaluation Patient Details Name: Erik [name] MRN: [id number] DOB: 01/19/1941 Today's Date: 10/31/2023   History of Present Illness   Pt is 83 yo presenting to [facility] ED on 6/4 due to worsening edema and shortness of breathe. PMH: HTN, HLD, DM, GERD, CKD III, CAD, CHF, BPH, anxiety.     Clinical Impressions At baseline, pt pt is Independent with ADLs and IADLs and performs functional mobility Mod I with a SPC or RW, and drives. Pt now presents with mildly decreased balance, decreased activity tolerance, mildly decreased safety awareness, and decreased safety and independence with functional tasks. Pt currently demonstrates ability to complete ADLs Independent to Contact guard assist and functional mobility/transfers with a RW with close Supervision to Contact guard assist. Pt VSS on RA throughout session. Pt participated well. Pt will benefit from acute skilled OT services to address deficits outlined below and to increase safety and independence with functional tasks. Pt declining HH OT at this time. OT recommends participation in Cardiac Rehab.      If plan is discharge home, recommend the following:   A little help with walking and/or transfers;A little help with bathing/dressing/bathroom;Assistance with cooking/housework;Assist for transportation;Help with stairs or ramp for entrance     Functional Status Assessment   Patient has had a recent decline in their functional status and demonstrates the ability to make significant improvements in function in a reasonable and predictable amount of time.     Equipment Recommendations   Tub/shower bench     Recommendations for Other Services         Precautions/Restrictions   Precautions Precautions: Fall Recall of Precautions/Restrictions: Impaired Precaution/Restrictions Comments: decreased safety awareness Restrictions Weight Bearing Restrictions Per Provider Order: No     Mobility Bed Mobility                General bed mobility comments: Pt siiting EOB at beginning and end of session    Transfers Overall transfer level: Needs assistance Equipment used: Rolling walker (2 wheels) Transfers: Sit to/from Stand, Bed to chair/wheelchair/BSC Sit to Stand: Supervision                  Balance Overall balance assessment: Mild deficits observed, not formally tested, History of Falls                                         ADL either performed or assessed with clinical judgement   ADL Overall ADL's : Needs assistance/impaired Eating/Feeding: Independent;Sitting   Grooming: Supervision/safety;Contact guard assist;Standing   Upper Body Bathing: Supervision/ safety;Sitting   Lower Body Bathing: Supervison/ safety;Contact guard assist;Sit to/from stand   Upper Body Dressing : Set up;Sitting   Lower Body Dressing: Supervision/safety;Contact guard assist;Sit to/from stand   Toilet Transfer: Supervision/safety;Contact guard assist;Ambulation;Rolling walker (2 wheels);BSC/3in1   Toileting- Clothing Manipulation and Hygiene: Supervision/safety;Contact guard assist;Sit to/from stand       Functional mobility during ADLs: Supervision/safety;Contact guard assist;Rolling walker (2 wheels)       Vision Baseline Vision/History: 1 Wears glasses Ability to See in Adequate Light: 0 Adequate (with glasses) Patient Visual Report: No change from baseline       Perception         Praxis         Pertinent Vitals/Pain Pain Assessment Pain Assessment: No/denies pain     Extremity/Trunk Assessment Upper Extremity Assessment Upper Extremity Assessment: Right hand dominant;Overall  WFL for tasks assessed;LUE deficits/detail LUE Deficits / Details: AROM shoulder flexion to appox 85 degrees at baseline due to prior injury approx 5 years ago   Lower Extremity Assessment Lower Extremity Assessment: Defer to PT evaluation   Cervical / Trunk  Assessment Cervical / Trunk Assessment: Normal   Communication Communication Communication: Impaired Factors Affecting Communication: Hearing impaired   Cognition Arousal: Alert Behavior During Therapy: WFL for tasks assessed/performed Cognition: No apparent impairments             OT - Cognition Comments: Pt AAOx4 and pleasant throughout session. Cognition WFL for tasks assessed. Cogntiion not formally screened or assessed.                 Following commands: Intact       Cueing  General Comments   Cueing Techniques: Verbal cues  VSS on RA throughout session   Exercises     Shoulder Instructions      Home Living Family/patient expects to be discharged to:: Private residence Living Arrangements: Spouse/significant other;Other relatives (stepson) Available Help at Discharge: Family Type of Home: House Home Access: Level entry;Stairs to enter Entrance Stairs-Number of Steps: 1 Entrance Stairs-Rails: None Home Layout: One level     Bathroom Shower/Tub: Chief Strategy Officer: Standard Bathroom Accessibility: Yes How Accessible: Accessible via walker Home Equipment: Cane - quad;Cane - single point;Rolling Walker (2 wheels);Rollator (4 wheels)          Prior Functioning/Environment Prior Level of Function : Independent/Modified Independent;Driving             Mobility Comments: uses cane in house, but times used RW in community ADLs Comments: Ind with ADLs and IADLs. Has 12 cats he feeds. Drives.    OT Problem List: Decreased activity tolerance;Impaired balance (sitting and/or standing);Decreased safety awareness   OT Treatment/Interventions: Self-care/ADL training;Energy conservation;DME and/or AE instruction;Therapeutic activities;Patient/family education;Balance training      OT Goals(Current goals can be found in the care plan section)   Acute Rehab OT Goals Patient Stated Goal: to return home, for B LE edema to conitnue to  improve, and for wounds on B feets to continue to heal OT Goal Formulation: With patient Time For Goal Achievement: 11/14/23 Potential to Achieve Goals: Good ADL Goals Pt Will Perform Grooming: with modified independence;standing Pt Will Perform Lower Body Bathing: with modified independence;sit to/from stand;sitting/lateral leans Pt Will Perform Lower Body Dressing: with modified independence;sit to/from stand;sitting/lateral leans Pt Will Transfer to Toilet: with modified independence;ambulating;regular height toilet (with least restrictive AD) Additional ADL Goal #1: Patient will demonstrate ability to Independently state 3 fall prevention strategies to increase safety and independence with functional tasks.   OT Frequency:  Min 2X/week    Co-evaluation              AM-PAC OT "6 Clicks" Daily Activity     Outcome Measure Help from another person eating meals?: None Help from another person taking care of personal grooming?: A Little Help from another person toileting, which includes using toliet, bedpan, or urinal?: A Little Help from another person bathing (including washing, rinsing, drying)?: A Little Help from another person to put on and taking off regular upper body clothing?: A Little Help from another person to put on and taking off regular lower body clothing?: A Little 6 Click Score: 19   End of Session Equipment Utilized During Treatment: Rolling walker (2 wheels);Gait belt Nurse Communication: Mobility status  Activity Tolerance: Patient tolerated treatment well Patient left: in  bed;with call bell/phone within reach;Other (comment) (Dietary staff in room)  OT Visit Diagnosis: Unsteadiness on feet (R26.81);Other abnormalities of gait and mobility (R26.89);History of falling (Z91.81)                Time: 1478-2956 OT Time Calculation (min): 22 min Charges:  OT General Charges $OT Visit: 1 Visit OT Evaluation $OT Eval Low Complexity: 1 Low  Jalaysha Skilton "Darral Ellis.,  OTR/L, MA Acute Rehab [phone]   Walt Gunner 10/31/2023, 4:33 PM

## 2023-10-31 NOTE — Progress Notes (Signed)
 Progress Note   Patient: Erik [name]:[id number] DOB: [birth year] DOA: 10/29/2023     1 DOS: the patient was seen and examined on 10/31/2023   Brief hospital course: Erik [name] is a 83 y.o. male with medical history significant of hypertension, hyperlipidemia, diabetes, GERD, CKD 3B, CAD, CHF, BPH, anxiety presenting with worsening leg edema and shortness of breath. He is admitted to [facility] service ofr further management of CHF exacerbation.  Assessment and Plan: Acute on chronic combined systolic and diastolic CHF Shortness of breath better. Leg swelling improving. Able to ambulate well. BNP in the ED was 646.  Magnesium 1.2 which has been repleted.  Chest x-ray  unremarkable.  Significant edema on exam. Decrease Lasix  to 40 mg once daily due to renal dysfunction. Strict I's and O's, daily weights Echocardiogram showed EF 45%, reduced right ventricular systolic function. Will get Cardiology evaluation. Trend renal function and electrolytes.   Hypertension Continue home metoprolol    Hyperlipidemia Continue home atorvastatin    Diabetes Blood sugars stable. Continue Lantus  20 units daily and adjust accordingly. Accuchecks, sliding scale insulin .   GERD Continue home PPI   CKD 3B Creatinine stable and at baseline. Trend renal function and electrolytes as he is on diuretic.   CAD Continue home atorvastatin , metoprolol    Atrial fibrillation Continue home Xarelto , metoprolol    Neuropathy Continue home Trileptal , Gabapentin    BPH Continue home tamsulosin , finasteride       Out of bed to chair. Incentive spirometry. Nursing supportive care. Fall, aspiration precautions. Diet:  Diet Orders (From admission, onward)     Start     Ordered   10/29/23 1350  Diet Heart Room service appropriate? Yes; Fluid consistency: Thin; Fluid restriction: 1800 mL Fluid  Diet effective now       Question Answer Comment  Room service appropriate? Yes   Fluid consistency: Thin    Fluid restriction: 1800 mL Fluid      10/29/23 1355           DVT prophylaxis:  Rivaroxaban  (XARELTO ) tablet 15 mg  Level of care: Telemetry Cardiac   Code Status: Full Code  Subjective: Patient is seen and examined today morning. He is able to ambulate with help. Says shortness of breath improved. b/l wounds wrapped.   Physical Exam: Vitals:   10/31/23 0208 10/31/23 0348 10/31/23 0956 10/31/23 1100  BP: (!) 132/49 (!) 119/38 101/76 (!) 138/46  Pulse: 60 67 74 77  Resp: 20 20  (!) 21  Temp: 97.6 F (36.4 C) 97.9 F (36.6 C) (!) 97.4 F (36.3 C) (!) 97.3 F (36.3 C)  TempSrc: Oral Oral Oral Axillary  SpO2: 93% 91% 95% 95%  Weight:  88 kg    Height:        General - Elderly Caucasian male, no apparent distress HEENT - PERRLA, EOMI, atraumatic head, non tender sinuses. Lung - Clear, basal rales, diffuse rhonchi, no wheezes. Heart - S1, S2 heard, no murmurs, rubs, 3+ pedal edema. Abdomen - Soft, non tender, bowel sounds good Neuro - Alert, awake and oriented x 3, non focal exam. Skin - Warm and dry. Bilateral leg wounds wrapped.  Data Reviewed:      Latest Ref Rng & Units 10/31/2023    2:34 AM 10/30/2023    2:19 AM 10/29/2023   10:13 AM  CBC  WBC 4.0 - 10.5 K/uL 5.6  5.3  5.4   Hemoglobin 13.0 - 17.0 g/dL 9.5  9.6  9.8   Hematocrit 39.0 - 52.0 % 31.3  31.5  32.7   Platelets 150 - 400 K/uL 148  122  154       Latest Ref Rng & Units 10/31/2023    2:34 AM 10/30/2023    2:19 AM 10/29/2023   10:13 AM  BMP  Glucose 70 - 99 mg/dL 77  409  811   BUN 8 - 23 mg/dL 29  32  31   Creatinine 0.61 - 1.24 mg/dL 9.14  7.82  9.56   Sodium 135 - 145 mmol/L 140  139  138   Potassium 3.5 - 5.1 mmol/L 3.7  3.3  3.5   Chloride 98 - 111 mmol/L 98  102  104   CO2 22 - 32 mmol/L 31  26  25    Calcium  8.9 - 10.3 mg/dL 7.7  7.7  7.7    ECHOCARDIOGRAM COMPLETE Result Date: 10/29/2023    ECHOCARDIOGRAM REPORT   Patient Name:   Erik [name] Date of Exam: 10/29/2023 Medical Rec #:  [id number]        Height:       71.0 in Accession #:    [id number]      Weight:       171.2 lb Date of Birth:  [birth year]        BSA:          1.974 m Patient Age:    82 years        BP:           120/72 mmHg Patient Gender: M               HR:           66 bpm. Exam Location:  Inpatient Procedure: 2D Echo, Cardiac Doppler, Color Doppler and Intracardiac            Opacification Agent (Both Spectral and Color Flow Doppler were            utilized during procedure). Indications:    I50.21 CHF  History:        Patient has no prior history of Echocardiogram examinations,                 most recent 09/19/2021. Cardiomyopathy, CAD; Risk                 Factors:Diabetes, Hypertension and Dyslipidemia. LVEF 50% GLS                 -17.9.  Sonographer:    Donnita Gales [facility], RDCS Referring Phys: [id number] Gayl Katos [facility]  Sonographer Comments: Technically difficult study due to poor echo windows. IMPRESSIONS  1. 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 EF estimated at 45%.. Left ventricular ejection fraction, by estimation, is 45%. The left ventricle has mildly decreased function. The left ventricle demonstrates global hypokinesis. The left ventricular internal cavity size was mildly dilated. Left ventricular diastolic function could not be evaluated.  2. Right ventricular systolic function is mildly reduced. The right ventricular size is normal.  3. Left atrial size was mildly dilated.  4. The mitral valve is normal in structure. Trivial mitral valve regurgitation. No evidence of mitral stenosis.  5. The aortic valve is tricuspid. There is mild calcification of the aortic valve. Aortic valve regurgitation is not visualized. No aortic stenosis is present. Conclusion(s)/Recommendation(s): LV not seen well. EF estimated at 45% but would consider TEE or cardiac MRI to further evalaute, if clinically indicated. FINDINGS  Left Ventricle: LV endocardium not seen well even with  Definity contrast. EF estimated at 45%. Left  ventricular ejection fraction, by estimation, is 45%. The left ventricle has mildly decreased function. The left ventricle demonstrates global hypokinesis. Definity contrast agent was given IV to delineate the left ventricular endocardial borders. The left ventricular internal cavity size was mildly dilated. There is no left ventricular hypertrophy. Left ventricular diastolic function could not  be evaluated due to atrial fibrillation. Left ventricular diastolic function could not be evaluated. Right Ventricle: The right ventricular size is normal. No increase in right ventricular wall thickness. Right ventricular systolic function is mildly reduced. Left Atrium: Left atrial size was mildly dilated. Right Atrium: Right atrial size was normal in size. Pericardium: There is no evidence of pericardial effusion. Mitral Valve: The mitral valve is normal in structure. Trivial mitral valve regurgitation. No evidence of mitral valve stenosis. Tricuspid Valve: The tricuspid valve is normal in structure. Tricuspid valve regurgitation is not demonstrated. No evidence of tricuspid stenosis. Aortic Valve: The aortic valve is tricuspid. There is mild calcification of the aortic valve. Aortic valve regurgitation is not visualized. No aortic stenosis is present. Pulmonic Valve: The pulmonic valve was normal in structure. Pulmonic valve regurgitation is not visualized. No evidence of pulmonic stenosis. Aorta: The aortic root is normal in size and structure. Venous: The inferior vena cava was not well visualized. IAS/Shunts: No atrial level shunt detected by color flow Doppler.  LEFT VENTRICLE PLAX 2D LVIDd:         5.80 cm   Diastology LVIDs:         5.30 cm   LV e' medial:    4.35 cm/s LV PW:         0.90 cm   LV E/e' medial:  22.6 LV IVS:        0.80 cm   LV e' lateral:   5.55 cm/s LVOT diam:     2.20 cm   LV E/e' lateral: 17.7 LV SV:         56 LV SV Index:   29 LVOT Area:     3.80 cm  RIGHT VENTRICLE RV Basal diam:  4.40 cm RV  Mid diam:    3.30 cm RV S prime:     9.46 cm/s TAPSE (M-mode): 2.0 cm RVSP:           46.6 mmHg LEFT ATRIUM             Index        RIGHT ATRIUM           Index LA diam:        4.40 cm 2.23 cm/m   RA Pressure: 3.00 mmHg LA Vol (A2C):   49.9 ml 25.28 ml/m  RA Area:     18.80 cm LA Vol (A4C):   53.8 ml 27.28 ml/m  RA Volume:   49.70 ml  25.18 ml/m LA Biplane Vol: 54.2 ml 27.46 ml/m  AORTIC VALVE LVOT Vmax:   67.20 cm/s LVOT Vmean:  47.300 cm/s LVOT VTI:    0.148 m  AORTA Ao Root diam: 3.20 cm Ao Asc diam:  3.00 cm MITRAL VALVE               TRICUSPID VALVE MV Area (PHT): 4.06 cm    TR Peak grad:   43.6 mmHg MV Decel Time: 187 msec    TR Vmax:        330.00 cm/s MV E velocity: 98.10 cm/s  Estimated RAP:  3.00 mmHg MV A velocity: 33.40 cm/s  RVSP:           46.6 mmHg MV E/A ratio:  2.94                            SHUNTS                            Systemic VTI:  0.15 m                            Systemic Diam: 2.20 cm Jules Oar MD Electronically signed by Jules Oar MD Signature Date/Time: 10/29/2023/7:05:46 PM    Final     Family Communication: Discussed with patient, understand and agree. All questions answered.  Disposition: Status is: Inpatient Remains inpatient appropriate because: IV diuresis  Planned Discharge Destination: Home with Home Health     Time spent: 39 minutes  Author: Aisha Hove, MD 10/31/2023 1:17 PM Secure chat 7am to 7pm For on call review [email or web address].

## 2023-10-31 NOTE — Plan of Care (Signed)
   Problem: Coping: Goal: Ability to adjust to condition or change in health will improve Outcome: Progressing   Problem: Fluid Volume: Goal: Ability to maintain a balanced intake and output will improve Outcome: Progressing   Problem: Health Behavior/Discharge Planning: Goal: Ability to manage health-related needs will improve Outcome: Progressing   Problem: Nutritional: Goal: Maintenance of adequate nutrition will improve Outcome: Progressing

## 2023-11-01 ENCOUNTER — Other Ambulatory Visit (HOSPITAL_COMMUNITY): Payer: Self-pay

## 2023-11-01 DIAGNOSIS — I2510 Atherosclerotic heart disease of native coronary artery without angina pectoris: Secondary | ICD-10-CM | POA: Diagnosis not present

## 2023-11-01 DIAGNOSIS — I480 Paroxysmal atrial fibrillation: Secondary | ICD-10-CM | POA: Diagnosis not present

## 2023-11-01 DIAGNOSIS — I5043 Acute on chronic combined systolic (congestive) and diastolic (congestive) heart failure: Secondary | ICD-10-CM | POA: Diagnosis not present

## 2023-11-01 DIAGNOSIS — N401 Enlarged prostate with lower urinary tract symptoms: Secondary | ICD-10-CM | POA: Diagnosis not present

## 2023-11-01 LAB — CBC
HCT: 31.7 % — ABNORMAL LOW (ref 39.0–52.0)
Hemoglobin: 9.7 g/dL — ABNORMAL LOW (ref 13.0–17.0)
MCH: 24.9 pg — ABNORMAL LOW (ref 26.0–34.0)
MCHC: 30.6 g/dL (ref 30.0–36.0)
MCV: 81.5 fL (ref 80.0–100.0)
Platelets: 154 10*3/uL (ref 150–400)
RBC: 3.89 MIL/uL — ABNORMAL LOW (ref 4.22–5.81)
RDW: 16.2 % — ABNORMAL HIGH (ref 11.5–15.5)
WBC: 6.1 10*3/uL (ref 4.0–10.5)
nRBC: 0 % (ref 0.0–0.2)

## 2023-11-01 LAB — BASIC METABOLIC PANEL WITH GFR
Anion gap: 4 — ABNORMAL LOW (ref 5–15)
BUN: 27 mg/dL — ABNORMAL HIGH (ref 8–23)
CO2: 33 mmol/L — ABNORMAL HIGH (ref 22–32)
Calcium: 7.8 mg/dL — ABNORMAL LOW (ref 8.9–10.3)
Chloride: 97 mmol/L — ABNORMAL LOW (ref 98–111)
Creatinine, Ser: 1.99 mg/dL — ABNORMAL HIGH (ref 0.61–1.24)
GFR, Estimated: 32 mL/min — ABNORMAL LOW (ref >60)
Glucose, Bld: 147 mg/dL — ABNORMAL HIGH (ref 70–99)
Potassium: 3.8 mmol/L (ref 3.5–5.1)
Sodium: 134 mmol/L — ABNORMAL LOW (ref 135–145)

## 2023-11-01 LAB — GLUCOSE, CAPILLARY
Glucose-Capillary: 125 mg/dL — ABNORMAL HIGH (ref 70–99)
Glucose-Capillary: 158 mg/dL — ABNORMAL HIGH (ref 70–99)

## 2023-11-01 LAB — MAGNESIUM: Magnesium: 2.1 mg/dL (ref 1.7–2.4)

## 2023-11-01 MED ORDER — FUROSEMIDE 40 MG PO TABS
40.0000 mg | ORAL_TABLET | Freq: Two times a day (BID) | ORAL | 3 refills | Status: AC
  Filled 2023-11-01: qty 30, 15d supply, fill #0

## 2023-11-01 NOTE — Discharge Summary (Signed)
 Physician Discharge Summary   Patient: Erik [name] MRN: [id number] DOB: Jun 17, 1940  Admit date:     10/29/2023  Discharge date: {dischdate:26783}  Discharge Physician: Aisha Hove   PCP: Tisovec, Richard W, MD   Recommendations at discharge:  {Tip this will not be part of the note when signed- Example include specific recommendations for outpatient follow-up, pending tests to follow-up on. (Optional):26781}  ***  Discharge Diagnoses: Principal Problem:   Acute on chronic combined systolic (congestive) and diastolic (congestive) heart failure (HCC) Active Problems:   Type 2 diabetes mellitus (HCC)   Chronic kidney disease, stage 3b (HCC)   Paroxysmal atrial fibrillation (HCC)   Coronary artery disease   Hypertension associated with diabetes (HCC)   GERD (gastroesophageal reflux disease)   Hyperlipidemia associated with type 2 diabetes mellitus (HCC)   Benign prostatic hyperplasia with lower urinary tract symptoms   Generalized anxiety disorder   Acute on chronic diastolic CHF (congestive heart failure) (HCC)   Pressure injury of skin  Resolved Problems:   * No resolved hospital problems. [facility] Course: No notes on file  Assessment and Plan: No notes have been filed under this hospital service. Service: Hospitalist     {Tip this will not be part of the note when signed Body mass index is 27.52 kg/m. , ,  Active Pressure Injury/Wound(s)     Pressure Ulcer  Duration          Pressure Injury 10/29/23 Buttocks Right Stage 2 -  Partial thickness loss of dermis presenting as a shallow open injury with a red, pink wound bed without slough. 2 days           (Optional):26781}  {(NOTE) Pain control PDMP Statment (Optional):26782} Consultants: *** Procedures performed: ***  Disposition: {Plan; Disposition:26390} Diet recommendation:  Discharge Diet Orders (From admission, onward)     Start     Ordered   11/01/23 0000  Diet - low sodium heart healthy         11/01/23 0951   11/01/23 0000  Diet Carb Modified        11/01/23 0951           {Diet_Plan:26776} DISCHARGE MEDICATION: Allergies as of 11/01/2023       Reactions   Claritin [loratadine] Other (See Comments)   Urinary retention   Tradjenta [linagliptin] Other (See Comments)   High blood sugar   Imipramine  Other (See Comments)   Other reaction(s): hallucination/dizziness        Medication List     STOP taking these medications    ibuprofen 200 MG tablet Commonly known as: ADVIL       TAKE these medications    acetaminophen  500 MG tablet Commonly known as: TYLENOL  Take 500 mg by mouth every 6 (six) hours as needed for mild pain (pain score 1-3), moderate pain (pain score 4-6) or fever.   atorvastatin  40 MG tablet Commonly known as: LIPITOR  Take 40 mg by mouth in the morning.   COQ10 PO Take 0.5 tablets by mouth in the morning.   Farxiga  10 MG Tabs tablet Generic drug: dapagliflozin  propanediol Take 10 mg by mouth in the morning.   furosemide  40 MG tablet Commonly known as: LASIX  Take 1 tablet (40 mg total) by mouth 2 (two) times daily. What changed: when to take this   gabapentin  300 MG capsule Commonly known as: NEURONTIN  Take 1 capsule (300 mg total) by mouth 2 (two) times daily.   metoprolol  succinate 50 MG 24 hr tablet  Commonly known as: Toprol  XL Take 1 tablet (50 mg total) by mouth daily. Take with or immediately following a meal. What changed:  how much to take when to take this   multivitamin capsule Take 1 capsule by mouth in the morning.   OXcarbazepine  150 MG tablet Commonly known as: TRILEPTAL  Take 1 tablet (150 mg total) by mouth 2 (two) times daily. What changed: additional instructions   pantoprazole  40 MG tablet Commonly known as: PROTONIX  Take 40 mg by mouth in the morning.   Rivaroxaban  15 MG Tabs tablet Commonly known as: XARELTO  Take 1 tablet (15 mg total) by mouth daily with supper. What changed: when to take  this   selenium 200 MCG Tabs tablet Take 200 mcg by mouth as needed.   tamsulosin  0.4 MG Caps capsule Commonly known as: FLOMAX  Take 0.4 mg by mouth in the morning and at bedtime.   Toujeo  SoloStar 300 UNIT/ML Solostar Pen Generic drug: insulin  glargine (1 Unit Dial) Inject 36 Units as directed in the morning.               Discharge Care Instructions  (From admission, onward)           Start     Ordered   11/01/23 0000  Discharge wound care:       Comments: Cleanse L lower leg and foot wounds with NS, apply Bactroban  ointment to all wound beds, cover with silver  hydrofiber (Aquacel AG Timm Foot #[id number]) cut to fit wound beds and apply daily. Cover with ABD pads and secure with Kerlix roll gauze beginning right above toes and ending right below knee.  Apply Coban or Ace bandage wrapped in same fashion as Kerlix for light compression   11/01/23 0951            Follow-up Information     [facility]. Go to.   Specialty: Cardiology Why: Hospital follow up 11/05/2023 @ 11 :45 am PLEASE bring a current medication list to appointment FREE valet parking, Entrance C, off [employer] (Look for the [facility] entrance) Contact information: [address] [place]  [address] [phone]               Discharge Exam: Cleavon Curls Weights   10/30/23 0437 10/31/23 0348 11/01/23 0558  Weight: 91 kg 88 kg 87 kg   ***  Condition at discharge: {DC Condition:26389}  The results of significant diagnostics from this hospitalization (including imaging, microbiology, ancillary and laboratory) are listed below for reference.   Imaging Studies: ECHOCARDIOGRAM COMPLETE Result Date: 10/29/2023    ECHOCARDIOGRAM REPORT   Patient Name:   TAHMIR [name] Date of Exam: 10/29/2023 Medical Rec #:  [id number]       Height:       71.0 in Accession #:    [id number]      Weight:       171.2 lb Date of Birth:   1940-11-30        BSA:          1.974 m Patient Age:    83 years        BP:           120/72 mmHg Patient Gender: M               HR:           66 bpm. Exam Location:  Inpatient Procedure: 2D Echo, Cardiac Doppler, Color Doppler and Intracardiac  Opacification Agent (Both Spectral and Color Flow Doppler were            utilized during procedure). Indications:    I50.21 CHF  History:        Patient has no prior history of Echocardiogram examinations,                 most recent 09/19/2021. Cardiomyopathy, CAD; Risk                 Factors:Diabetes, Hypertension and Dyslipidemia. LVEF 50% GLS                 -17.9.  Sonographer:    Donnita Gales [facility], RDCS Referring Phys: [id number] Gayl Katos [facility]  Sonographer Comments: Technically difficult study due to poor echo windows. IMPRESSIONS  1. LV endocardium not seen well even with Definity  contrast. EF estimated at 45%.. Left ventricular ejection fraction, by estimation, is 45%. The left ventricle has mildly decreased function. The left ventricle demonstrates global hypokinesis. The left ventricular internal cavity size was mildly dilated. Left ventricular diastolic function could not be evaluated.  2. Right ventricular systolic function is mildly reduced. The right ventricular size is normal.  3. Left atrial size was mildly dilated.  4. The mitral valve is normal in structure. Trivial mitral valve regurgitation. No evidence of mitral stenosis.  5. The aortic valve is tricuspid. There is mild calcification of the aortic valve. Aortic valve regurgitation is not visualized. No aortic stenosis is present. Conclusion(s)/Recommendation(s): LV not seen well. EF estimated at 45% but would consider TEE or cardiac MRI to further evalaute, if clinically indicated. FINDINGS  Left Ventricle: LV endocardium not seen well even with Definity  contrast. EF estimated at 45%. Left ventricular ejection fraction, by estimation, is 45%. The left ventricle has mildly decreased function.  The left ventricle demonstrates global hypokinesis. Definity  contrast agent was given IV to delineate the left ventricular endocardial borders. The left ventricular internal cavity size was mildly dilated. There is no left ventricular hypertrophy. Left ventricular diastolic function could not  be evaluated due to atrial fibrillation. Left ventricular diastolic function could not be evaluated. Right Ventricle: The right ventricular size is normal. No increase in right ventricular wall thickness. Right ventricular systolic function is mildly reduced. Left Atrium: Left atrial size was mildly dilated. Right Atrium: Right atrial size was normal in size. Pericardium: There is no evidence of pericardial effusion. Mitral Valve: The mitral valve is normal in structure. Trivial mitral valve regurgitation. No evidence of mitral valve stenosis. Tricuspid Valve: The tricuspid valve is normal in structure. Tricuspid valve regurgitation is not demonstrated. No evidence of tricuspid stenosis. Aortic Valve: The aortic valve is tricuspid. There is mild calcification of the aortic valve. Aortic valve regurgitation is not visualized. No aortic stenosis is present. Pulmonic Valve: The pulmonic valve was normal in structure. Pulmonic valve regurgitation is not visualized. No evidence of pulmonic stenosis. Aorta: The aortic root is normal in size and structure. Venous: The inferior vena cava was not well visualized. IAS/Shunts: No atrial level shunt detected by color flow Doppler.  LEFT VENTRICLE PLAX 2D LVIDd:         5.80 cm   Diastology LVIDs:         5.30 cm   LV e' medial:    4.35 cm/s LV PW:         0.90 cm   LV E/e' medial:  22.6 LV IVS:        0.80 cm   LV e'  lateral:   5.55 cm/s LVOT diam:     2.20 cm   LV E/e' lateral: 17.7 LV SV:         56 LV SV Index:   29 LVOT Area:     3.80 cm  RIGHT VENTRICLE RV Basal diam:  4.40 cm RV Mid diam:    3.30 cm RV S prime:     9.46 cm/s TAPSE (M-mode): 2.0 cm RVSP:           46.6 mmHg LEFT  ATRIUM             Index        RIGHT ATRIUM           Index LA diam:        4.40 cm 2.23 cm/m   RA Pressure: 3.00 mmHg LA Vol (A2C):   49.9 ml 25.28 ml/m  RA Area:     18.80 cm LA Vol (A4C):   53.8 ml 27.28 ml/m  RA Volume:   49.70 ml  25.18 ml/m LA Biplane Vol: 54.2 ml 27.46 ml/m  AORTIC VALVE LVOT Vmax:   67.20 cm/s LVOT Vmean:  47.300 cm/s LVOT VTI:    0.148 m  AORTA Ao Root diam: 3.20 cm Ao Asc diam:  3.00 cm MITRAL VALVE               TRICUSPID VALVE MV Area (PHT): 4.06 cm    TR Peak grad:   43.6 mmHg MV Decel Time: 187 msec    TR Vmax:        330.00 cm/s MV E velocity: 98.10 cm/s  Estimated RAP:  3.00 mmHg MV A velocity: 33.40 cm/s  RVSP:           46.6 mmHg MV E/A ratio:  2.94                            SHUNTS                            Systemic VTI:  0.15 m                            Systemic Diam: 2.20 cm Jules Oar MD Electronically signed by Jules Oar MD Signature Date/Time: 10/29/2023/7:05:46 PM    Final    DG Chest 2 View Result Date: 10/29/2023 CLINICAL DATA:  shortness of breath EXAM: CHEST - 2 VIEW COMPARISON:  07/26/2023 FINDINGS: Somewhat coarse pulmonary interstitial markings without focal infiltrate or overt edema. Heart size and mediastinal contours are within normal limits. Aortic Atherosclerosis (ICD10-170.0). Chronic blunting of the left lateral costophrenic angle. Vertebral endplate spurring at multiple levels in the mid and lower thoracic spine. Prior left shoulder arthroplasty. IMPRESSION: 1. No acute findings. 2. Chronic blunting of the left lateral costophrenic angle. Electronically Signed   By: Nicoletta Barrier M.D.   On: 10/29/2023 11:04    Microbiology: Results for orders placed or performed during the hospital encounter of 07/23/23  Resp panel by RT-PCR (RSV, Flu A&B, Covid) Anterior Nasal Swab     Status: Abnormal   Collection Time: 07/23/23  4:04 PM   Specimen: Anterior Nasal Swab  Result Value Ref Range Status   SARS Coronavirus 2 by RT PCR POSITIVE (A)  NEGATIVE Final    Comment: (NOTE) SARS-CoV-2 target nucleic acids are DETECTED.  The SARS-CoV-2 RNA is generally  detectable in upper respiratory specimens during the acute phase of infection. Positive results are indicative of the presence of the identified virus, but do not rule out bacterial infection or co-infection with other pathogens not detected by the test. Clinical correlation with patient history and other diagnostic information is necessary to determine patient infection status. The expected result is Negative.  Fact Sheet for Patients: BloggerCourse.com  Fact Sheet for Healthcare Providers: SeriousBroker.it  This test is not yet approved or cleared by the [place]  FDA and  has been authorized for detection and/or diagnosis of SARS-CoV-2 by FDA under an Emergency Use Authorization (EUA).  This EUA will remain in effect (meaning this test can be used) for the duration of  the COVID-19 declaration under Section 564(b)(1) of the A ct, 21 U.S.C. section 360bbb-3(b)(1), unless the authorization is terminated or revoked sooner.     Influenza A by PCR NEGATIVE NEGATIVE Final   Influenza B by PCR NEGATIVE NEGATIVE Final    Comment: (NOTE) The Xpert Xpress SARS-CoV-2/FLU/RSV plus assay is intended as an aid in the diagnosis of influenza from Nasopharyngeal swab specimens and should not be used as a sole basis for treatment. Nasal washings and aspirates are unacceptable for Xpert Xpress SARS-CoV-2/FLU/RSV testing.  Fact Sheet for Patients: BloggerCourse.com  Fact Sheet for Healthcare Providers: SeriousBroker.it  This test is not yet approved or cleared by the [place]  FDA and has been authorized for detection and/or diagnosis of SARS-CoV-2 by FDA under an Emergency Use Authorization (EUA). This EUA will remain in effect (meaning this test can be used) for the  duration of the COVID-19 declaration under Section 564(b)(1) of the Act, 21 U.S.C. section 360bbb-3(b)(1), unless the authorization is terminated or revoked.     Resp Syncytial Virus by PCR NEGATIVE NEGATIVE Final    Comment: (NOTE) Fact Sheet for Patients: BloggerCourse.com  Fact Sheet for Healthcare Providers: SeriousBroker.it  This test is not yet approved or cleared by the [place]  FDA and has been authorized for detection and/or diagnosis of SARS-CoV-2 by FDA under an Emergency Use Authorization (EUA). This EUA will remain in effect (meaning this test can be used) for the duration of the COVID-19 declaration under Section 564(b)(1) of the Act, 21 U.S.C. section 360bbb-3(b)(1), unless the authorization is terminated or revoked.  Performed at [facility], [address] [place], [place] [address]     Labs: CBC: Recent Labs  Lab 10/29/23 1013 10/30/23 0219 10/31/23 0234 11/01/23 0224  WBC 5.4 5.3 5.6 6.1  HGB 9.8* 9.6* 9.5* 9.7*  HCT 32.7* 31.5* 31.3* 31.7*  MCV 83.6 84.2 82.8 81.5  PLT 154 122* 148* 154   Basic Metabolic Panel: Recent Labs  Lab 10/29/23 1013 10/30/23 0219 10/31/23 0234 11/01/23 0224  NA 138 139 140 134*  K 3.5 3.3* 3.7 3.8  CL 104 102 98 97*  CO2 25 26 31  33*  GLUCOSE 194* 168* 77 147*  BUN 31* 32* 29* 27*  CREATININE 1.99* 1.98* 2.02* 1.99*  CALCIUM  7.7* 7.7* 7.7* 7.8*  MG 1.2* 1.6*  --  2.1   Liver Function Tests: Recent Labs  Lab 10/30/23 0219  AST 13*  ALT 7  ALKPHOS 65  BILITOT 0.6  PROT 5.5*  ALBUMIN  2.7*   CBG: Recent Labs  Lab 10/31/23 1225 10/31/23 1251 10/31/23 1623 10/31/23 2027 11/01/23 0558  GLUCAP 151* 138* 161* 156* 125*    Discharge time spent: {LESS THAN/GREATER VQQV:95638} 30 minutes.  Signed: Aisha Hove, MD [facility] 11/01/2023

## 2023-11-03 ENCOUNTER — Other Ambulatory Visit (HOSPITAL_COMMUNITY): Payer: Self-pay

## 2023-11-04 ENCOUNTER — Encounter (HOSPITAL_BASED_OUTPATIENT_CLINIC_OR_DEPARTMENT_OTHER): Admitting: Internal Medicine

## 2023-11-04 ENCOUNTER — Telehealth (HOSPITAL_COMMUNITY): Payer: Self-pay

## 2023-11-04 DIAGNOSIS — E11621 Type 2 diabetes mellitus with foot ulcer: Secondary | ICD-10-CM | POA: Diagnosis not present

## 2023-11-04 DIAGNOSIS — I87321 Chronic venous hypertension (idiopathic) with inflammation of right lower extremity: Secondary | ICD-10-CM | POA: Diagnosis not present

## 2023-11-04 DIAGNOSIS — S91302A Unspecified open wound, left foot, initial encounter: Secondary | ICD-10-CM | POA: Diagnosis not present

## 2023-11-04 DIAGNOSIS — S81802A Unspecified open wound, left lower leg, initial encounter: Secondary | ICD-10-CM

## 2023-11-04 DIAGNOSIS — L97418 Non-pressure chronic ulcer of right heel and midfoot with other specified severity: Secondary | ICD-10-CM

## 2023-11-04 DIAGNOSIS — I87312 Chronic venous hypertension (idiopathic) with ulcer of left lower extremity: Secondary | ICD-10-CM | POA: Diagnosis not present

## 2023-11-04 DIAGNOSIS — X58XXXA Exposure to other specified factors, initial encounter: Secondary | ICD-10-CM | POA: Diagnosis not present

## 2023-11-04 NOTE — Telephone Encounter (Signed)
 Called to confirm/remind patient of their appointment at the [facility] on 11/05/2023 11:45.   Appointment:   [] Confirmed  [] Left mess   [] No answer/No voice mail  [] VM Full/unable to leave message  [x] Phone not in service  Patient reminded to bring all medications and/or complete list.  Confirmed patient has transportation. Gave directions, instructed to utilize valet parking.

## 2023-11-05 ENCOUNTER — Encounter (HOSPITAL_COMMUNITY)

## 2023-11-05 NOTE — Progress Notes (Incomplete)
 HEART & VASCULAR TRANSITION OF CARE CONSULT NOTE     Referring Physician: Tisovec, Richard W, MD   Chief Complaint:   HPI: Referred to clinic by *** for heart failure consultation.   Erik [name] is a 83 y.o. male with history of HFrEF/NICM, PAF, CKD, DM II, PAD. EF a low as 3-40% in 2009. Later improved to 55% on echo 2010. EF down to 35-40% in 2015 in setting of AF with RVR.   Admitted in 02/25 with acute respiratory failure in setting of COVID-19 infection and a/c CHF.  He was admitted 10/29/23 with acute on chronic CHF. He was diuresed with IV lasix . Echo with EF 45%, RV reduced  Cardiac Testing    Past Medical History:  Diagnosis Date   Acute on chronic renal failure (HCC)    Acute respiratory failure with hypoxia (HCC) 07/23/2023   Chest pain 09/23/2013   Chronic combined systolic and diastolic CHF (congestive heart failure) (HCC)    CKD (chronic kidney disease), stage III (HCC)    Coronary artery disease    a. Nonobstructive by cath 2004. b. Nuc 09/2013 - no ischemia, fixed defect suggestive of possible prior infarction, felt low risk, EF 48%.    COVID-19 virus infection 07/23/2023   Diabetes mellitus    GERD (gastroesophageal reflux disease)    Hyperlipidemia    Hypertension    NICM (nonischemic cardiomyopathy) (HCC)    a. EF 35-40% in 2009, 55% in 2010. b. 09/2013: 35-40% by echo and 48% by nuc. c. EF 50-55% by echo 12/2014.   PAD (peripheral artery disease) (HCC)    a. mild by noninvasive testing.   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     atorvastatin  (LIPITOR ) 40 MG tablet Take 40 mg by mouth in the morning.     Coenzyme Q10 (COQ10 PO) Take 0.5 tablets by mouth in the morning.     dapagliflozin  propanediol (FARXIGA ) 10 MG TABS tablet Take 10 mg by mouth in the morning.     furosemide  (LASIX ) 40 MG tablet Take 1 tablet (40 mg total) by mouth 2 (two) times daily. 30 tablet 3   gabapentin  (NEURONTIN ) 300 MG capsule Take 1 capsule (300 mg total) by mouth 2 (two) times daily. 180 capsule 3   metoprolol  succinate (TOPROL  XL) 50 MG 24 hr tablet Take 1 tablet (50 mg total) by mouth daily. Take with or immediately following a meal. (Patient taking differently: Take 75 mg by mouth in the morning. Take with or immediately following a meal.)     Multiple Vitamin (MULTIVITAMIN) capsule Take 1 capsule by mouth in the morning.     OXcarbazepine  (TRILEPTAL ) 150 MG tablet Take 1 tablet (150 mg total) by mouth 2 (two) times daily. (Patient taking differently: Take 150 mg by mouth 2 (two) times daily. Take with gabapentin ) 180 tablet 1   pantoprazole  (PROTONIX ) 40 MG tablet Take 40 mg by mouth in the morning.  Rivaroxaban  (XARELTO ) 15 MG TABS tablet Take 1 tablet (15 mg total) by mouth daily with supper. (Patient taking differently: Take 15 mg by mouth in the morning.) 14 tablet 0   selenium 200 MCG TABS tablet Take 200 mcg by mouth as needed.     tamsulosin  (FLOMAX ) 0.4 MG CAPS capsule Take 0.4 mg by mouth in the morning and at bedtime.     TOUJEO  SOLOSTAR 300 UNIT/ML SOPN Inject 36 Units as directed in the morning.  4   No current facility-administered medications for this visit.    Allergies  Allergen Reactions   Claritin [Loratadine] Other (See Comments)    Urinary retention   Tradjenta [Linagliptin] Other (See Comments)    High blood sugar   Imipramine  Other (See Comments)    Other reaction(s): hallucination/dizziness      Social  History   Socioeconomic History   Marital status: Married    Spouse name: Eveleen Hinds   Number of children: 3   Years of education: Not on file   Highest education level: Not on file  Occupational History   Occupation: retired    Comment: from maintenance  Tobacco Use   Smoking status: Former    Current packs/day: 0.00    Average packs/day: 0.5 packs/day for 10.0 years (5.0 ttl pk-yrs)    Types: Cigarettes    Start date: 01/22/1975    Quit date: 01/21/1985    Years since quitting: 38.8   Smokeless tobacco: Never   Tobacco comments:    quit 35 yrs ago  Vaping Use   Vaping status: Never Used  Substance and Sexual Activity   Alcohol  use: No   Drug use: No   Sexual activity: Not Currently    Birth control/protection: None  Other Topics Concern   Not on file  Social History Narrative   Lives with wife in a one story home.  Has 3 children.  Retired from IT trainer at [employer].     Education: high school.      Right Handed    Lives in a one story    Social Drivers of Health   Financial Resource Strain: Low Risk  (10/30/2023)   Overall Financial Resource Strain (CARDIA)    Difficulty of Paying Living Expenses: Not hard at all  Food Insecurity: No Food Insecurity (10/29/2023)   Hunger Vital Sign    Worried About Running Out of Food in the Last Year: Never true    Ran Out of Food in the Last Year: Never true  Transportation Needs: No Transportation Needs (10/30/2023)   PRAPARE - Administrator, Civil Service (Medical): No    Lack of Transportation (Non-Medical): No  Physical Activity: Not on file  Stress: Not on file  Social Connections: Moderately Integrated (10/29/2023)   Social Connection and Isolation Panel [NHANES]    Frequency of Communication with Friends and Family: Once a week    Frequency of Social Gatherings with Friends and Family: Once a week    Attends Religious Services: 1 to 4 times per year    Active Member of [employer] or Organizations: No     Attends Banker Meetings: 1 to 4 times per year    Marital Status: Married  Catering manager Violence: Not At Risk (10/29/2023)   Humiliation, Afraid, Rape, and Kick questionnaire    Fear of Current or Ex-Partner: No    Emotionally Abused: No    Physically Abused: No    Sexually Abused: No  Family History  Problem Relation Age of Onset   Heart disease Mother    Diabetes Mother    Hypertension Mother    Heart disease Father    Heart disease Brother    Diabetes Brother    Hypertension Brother    Heart attack Neg Hx    Stroke Neg Hx     There were no vitals filed for this visit.  PHYSICAL EXAM: General:  Well appearing. No respiratory difficulty HEENT: normal Neck: supple. no JVD. Carotids 2+ bilat; no bruits. No lymphadenopathy or thryomegaly appreciated. Cor: PMI nondisplaced. Regular rate & rhythm. No rubs, gallops or murmurs. Lungs: clear Abdomen: soft, nontender, nondistended. No hepatosplenomegaly. No bruits or masses. Good bowel sounds. Extremities: no cyanosis, clubbing, rash, edema Neuro: alert & oriented x 3, cranial nerves grossly intact. moves all 4 extremities w/o difficulty. Affect pleasant.  ECG:   ASSESSMENT & PLAN:  NYHA *** GDMT  Diuretic- BB- Ace/ARB/ARNI MRA SGLT2i    Referred to HFSW (PCP, Medications, Transportation, ETOH Abuse, Drug Abuse, Insurance, Financial ): Yes or No Refer to Pharmacy: Yes or No Refer to Home Health: Yes on No Refer to [facility]: Yes or no  Refer to General Cardiology: Yes or No  Follow up

## 2023-11-10 DIAGNOSIS — E1129 Type 2 diabetes mellitus with other diabetic kidney complication: Secondary | ICD-10-CM | POA: Diagnosis not present

## 2023-11-10 DIAGNOSIS — G629 Polyneuropathy, unspecified: Secondary | ICD-10-CM | POA: Diagnosis not present

## 2023-11-10 DIAGNOSIS — I480 Paroxysmal atrial fibrillation: Secondary | ICD-10-CM | POA: Diagnosis not present

## 2023-11-10 DIAGNOSIS — L089 Local infection of the skin and subcutaneous tissue, unspecified: Secondary | ICD-10-CM | POA: Diagnosis not present

## 2023-11-10 DIAGNOSIS — I509 Heart failure, unspecified: Secondary | ICD-10-CM | POA: Diagnosis not present

## 2023-11-10 DIAGNOSIS — E7800 Pure hypercholesterolemia, unspecified: Secondary | ICD-10-CM | POA: Diagnosis not present

## 2023-11-10 DIAGNOSIS — I130 Hypertensive heart and chronic kidney disease with heart failure and stage 1 through stage 4 chronic kidney disease, or unspecified chronic kidney disease: Secondary | ICD-10-CM | POA: Diagnosis not present

## 2023-11-10 DIAGNOSIS — T148XXA Other injury of unspecified body region, initial encounter: Secondary | ICD-10-CM | POA: Diagnosis not present

## 2023-11-10 DIAGNOSIS — N1832 Chronic kidney disease, stage 3b: Secondary | ICD-10-CM | POA: Diagnosis not present

## 2023-11-10 DIAGNOSIS — Z7901 Long term (current) use of anticoagulants: Secondary | ICD-10-CM | POA: Diagnosis not present

## 2023-11-10 DIAGNOSIS — I2510 Atherosclerotic heart disease of native coronary artery without angina pectoris: Secondary | ICD-10-CM | POA: Diagnosis not present

## 2023-11-10 DIAGNOSIS — R531 Weakness: Secondary | ICD-10-CM | POA: Diagnosis not present

## 2023-11-10 LAB — LAB REPORT - SCANNED: EGFR: 31.9

## 2023-11-11 DIAGNOSIS — N401 Enlarged prostate with lower urinary tract symptoms: Secondary | ICD-10-CM | POA: Diagnosis not present

## 2023-11-13 ENCOUNTER — Encounter (HOSPITAL_BASED_OUTPATIENT_CLINIC_OR_DEPARTMENT_OTHER): Admitting: Internal Medicine

## 2023-11-13 DIAGNOSIS — L97418 Non-pressure chronic ulcer of right heel and midfoot with other specified severity: Secondary | ICD-10-CM

## 2023-11-13 DIAGNOSIS — E11621 Type 2 diabetes mellitus with foot ulcer: Secondary | ICD-10-CM | POA: Diagnosis not present

## 2023-11-13 DIAGNOSIS — I87312 Chronic venous hypertension (idiopathic) with ulcer of left lower extremity: Secondary | ICD-10-CM

## 2023-11-13 DIAGNOSIS — S91302A Unspecified open wound, left foot, initial encounter: Secondary | ICD-10-CM | POA: Diagnosis not present

## 2023-11-18 ENCOUNTER — Encounter (HOSPITAL_BASED_OUTPATIENT_CLINIC_OR_DEPARTMENT_OTHER): Admitting: Internal Medicine

## 2023-11-18 DIAGNOSIS — L97418 Non-pressure chronic ulcer of right heel and midfoot with other specified severity: Secondary | ICD-10-CM

## 2023-11-18 DIAGNOSIS — S91302A Unspecified open wound, left foot, initial encounter: Secondary | ICD-10-CM | POA: Diagnosis not present

## 2023-11-18 DIAGNOSIS — E11621 Type 2 diabetes mellitus with foot ulcer: Secondary | ICD-10-CM

## 2023-11-21 DIAGNOSIS — Q631 Lobulated, fused and horseshoe kidney: Secondary | ICD-10-CM | POA: Diagnosis not present

## 2023-11-21 DIAGNOSIS — N401 Enlarged prostate with lower urinary tract symptoms: Secondary | ICD-10-CM | POA: Diagnosis not present

## 2023-11-21 DIAGNOSIS — Z9049 Acquired absence of other specified parts of digestive tract: Secondary | ICD-10-CM | POA: Diagnosis not present

## 2023-11-24 ENCOUNTER — Encounter (HOSPITAL_BASED_OUTPATIENT_CLINIC_OR_DEPARTMENT_OTHER): Admitting: Internal Medicine

## 2023-11-24 DIAGNOSIS — I87312 Chronic venous hypertension (idiopathic) with ulcer of left lower extremity: Secondary | ICD-10-CM

## 2023-11-24 DIAGNOSIS — E11621 Type 2 diabetes mellitus with foot ulcer: Secondary | ICD-10-CM

## 2023-11-24 DIAGNOSIS — S91302A Unspecified open wound, left foot, initial encounter: Secondary | ICD-10-CM | POA: Diagnosis not present

## 2023-11-24 DIAGNOSIS — L97418 Non-pressure chronic ulcer of right heel and midfoot with other specified severity: Secondary | ICD-10-CM | POA: Diagnosis not present

## 2023-11-26 DIAGNOSIS — D631 Anemia in chronic kidney disease: Secondary | ICD-10-CM | POA: Diagnosis not present

## 2023-11-26 DIAGNOSIS — N189 Chronic kidney disease, unspecified: Secondary | ICD-10-CM | POA: Diagnosis not present

## 2023-11-26 DIAGNOSIS — E1122 Type 2 diabetes mellitus with diabetic chronic kidney disease: Secondary | ICD-10-CM | POA: Diagnosis not present

## 2023-11-26 DIAGNOSIS — Q631 Lobulated, fused and horseshoe kidney: Secondary | ICD-10-CM | POA: Diagnosis not present

## 2023-11-26 DIAGNOSIS — I480 Paroxysmal atrial fibrillation: Secondary | ICD-10-CM | POA: Diagnosis not present

## 2023-11-26 DIAGNOSIS — I129 Hypertensive chronic kidney disease with stage 1 through stage 4 chronic kidney disease, or unspecified chronic kidney disease: Secondary | ICD-10-CM | POA: Diagnosis not present

## 2023-11-26 DIAGNOSIS — I5189 Other ill-defined heart diseases: Secondary | ICD-10-CM | POA: Diagnosis not present

## 2023-11-26 DIAGNOSIS — N2581 Secondary hyperparathyroidism of renal origin: Secondary | ICD-10-CM | POA: Diagnosis not present

## 2023-11-26 DIAGNOSIS — G500 Trigeminal neuralgia: Secondary | ICD-10-CM | POA: Diagnosis not present

## 2023-11-26 DIAGNOSIS — N183 Chronic kidney disease, stage 3 unspecified: Secondary | ICD-10-CM | POA: Diagnosis not present

## 2023-11-26 DIAGNOSIS — N179 Acute kidney failure, unspecified: Secondary | ICD-10-CM | POA: Diagnosis not present

## 2023-11-26 DIAGNOSIS — I428 Other cardiomyopathies: Secondary | ICD-10-CM | POA: Diagnosis not present

## 2023-11-26 DIAGNOSIS — R809 Proteinuria, unspecified: Secondary | ICD-10-CM | POA: Diagnosis not present

## 2023-11-26 LAB — LAB REPORT - SCANNED: EGFR: 33

## 2023-12-01 ENCOUNTER — Encounter (HOSPITAL_BASED_OUTPATIENT_CLINIC_OR_DEPARTMENT_OTHER): Attending: Internal Medicine | Admitting: Internal Medicine

## 2023-12-01 DIAGNOSIS — E11621 Type 2 diabetes mellitus with foot ulcer: Secondary | ICD-10-CM | POA: Insufficient documentation

## 2023-12-01 DIAGNOSIS — S81802A Unspecified open wound, left lower leg, initial encounter: Secondary | ICD-10-CM

## 2023-12-01 DIAGNOSIS — L03115 Cellulitis of right lower limb: Secondary | ICD-10-CM | POA: Diagnosis not present

## 2023-12-01 DIAGNOSIS — L97418 Non-pressure chronic ulcer of right heel and midfoot with other specified severity: Secondary | ICD-10-CM | POA: Diagnosis not present

## 2023-12-01 DIAGNOSIS — L97518 Non-pressure chronic ulcer of other part of right foot with other specified severity: Secondary | ICD-10-CM | POA: Diagnosis not present

## 2023-12-01 DIAGNOSIS — I87312 Chronic venous hypertension (idiopathic) with ulcer of left lower extremity: Secondary | ICD-10-CM

## 2023-12-01 DIAGNOSIS — S91302A Unspecified open wound, left foot, initial encounter: Secondary | ICD-10-CM | POA: Insufficient documentation

## 2023-12-01 DIAGNOSIS — I87323 Chronic venous hypertension (idiopathic) with inflammation of bilateral lower extremity: Secondary | ICD-10-CM | POA: Insufficient documentation

## 2023-12-08 ENCOUNTER — Encounter (HOSPITAL_BASED_OUTPATIENT_CLINIC_OR_DEPARTMENT_OTHER): Admitting: Internal Medicine

## 2023-12-08 DIAGNOSIS — I87312 Chronic venous hypertension (idiopathic) with ulcer of left lower extremity: Secondary | ICD-10-CM

## 2023-12-08 DIAGNOSIS — N401 Enlarged prostate with lower urinary tract symptoms: Secondary | ICD-10-CM | POA: Diagnosis not present

## 2023-12-08 DIAGNOSIS — R351 Nocturia: Secondary | ICD-10-CM | POA: Diagnosis not present

## 2023-12-08 DIAGNOSIS — E11621 Type 2 diabetes mellitus with foot ulcer: Secondary | ICD-10-CM

## 2023-12-08 DIAGNOSIS — L97418 Non-pressure chronic ulcer of right heel and midfoot with other specified severity: Secondary | ICD-10-CM | POA: Diagnosis not present

## 2023-12-08 DIAGNOSIS — R972 Elevated prostate specific antigen [PSA]: Secondary | ICD-10-CM | POA: Diagnosis not present

## 2023-12-08 DIAGNOSIS — S81802A Unspecified open wound, left lower leg, initial encounter: Secondary | ICD-10-CM

## 2023-12-09 DIAGNOSIS — D649 Anemia, unspecified: Secondary | ICD-10-CM | POA: Diagnosis not present

## 2023-12-09 DIAGNOSIS — I2510 Atherosclerotic heart disease of native coronary artery without angina pectoris: Secondary | ICD-10-CM | POA: Diagnosis not present

## 2023-12-09 DIAGNOSIS — I130 Hypertensive heart and chronic kidney disease with heart failure and stage 1 through stage 4 chronic kidney disease, or unspecified chronic kidney disease: Secondary | ICD-10-CM | POA: Diagnosis not present

## 2023-12-09 DIAGNOSIS — Z7901 Long term (current) use of anticoagulants: Secondary | ICD-10-CM | POA: Diagnosis not present

## 2023-12-09 DIAGNOSIS — K219 Gastro-esophageal reflux disease without esophagitis: Secondary | ICD-10-CM | POA: Diagnosis not present

## 2023-12-09 DIAGNOSIS — R531 Weakness: Secondary | ICD-10-CM | POA: Diagnosis not present

## 2023-12-09 DIAGNOSIS — E1129 Type 2 diabetes mellitus with other diabetic kidney complication: Secondary | ICD-10-CM | POA: Diagnosis not present

## 2023-12-09 DIAGNOSIS — I509 Heart failure, unspecified: Secondary | ICD-10-CM | POA: Diagnosis not present

## 2023-12-09 DIAGNOSIS — N401 Enlarged prostate with lower urinary tract symptoms: Secondary | ICD-10-CM | POA: Diagnosis not present

## 2023-12-09 DIAGNOSIS — G629 Polyneuropathy, unspecified: Secondary | ICD-10-CM | POA: Diagnosis not present

## 2023-12-09 DIAGNOSIS — E7800 Pure hypercholesterolemia, unspecified: Secondary | ICD-10-CM | POA: Diagnosis not present

## 2023-12-09 DIAGNOSIS — N1832 Chronic kidney disease, stage 3b: Secondary | ICD-10-CM | POA: Diagnosis not present

## 2023-12-09 DIAGNOSIS — I480 Paroxysmal atrial fibrillation: Secondary | ICD-10-CM | POA: Diagnosis not present

## 2023-12-15 ENCOUNTER — Encounter (HOSPITAL_BASED_OUTPATIENT_CLINIC_OR_DEPARTMENT_OTHER): Admitting: Internal Medicine

## 2023-12-15 DIAGNOSIS — S91302A Unspecified open wound, left foot, initial encounter: Secondary | ICD-10-CM

## 2023-12-15 DIAGNOSIS — E11621 Type 2 diabetes mellitus with foot ulcer: Secondary | ICD-10-CM

## 2023-12-15 DIAGNOSIS — I87322 Chronic venous hypertension (idiopathic) with inflammation of left lower extremity: Secondary | ICD-10-CM | POA: Diagnosis not present

## 2023-12-19 DIAGNOSIS — D649 Anemia, unspecified: Secondary | ICD-10-CM | POA: Diagnosis not present

## 2023-12-22 ENCOUNTER — Encounter (HOSPITAL_BASED_OUTPATIENT_CLINIC_OR_DEPARTMENT_OTHER): Admitting: Internal Medicine

## 2023-12-22 DIAGNOSIS — L97522 Non-pressure chronic ulcer of other part of left foot with fat layer exposed: Secondary | ICD-10-CM | POA: Diagnosis not present

## 2023-12-22 DIAGNOSIS — E11621 Type 2 diabetes mellitus with foot ulcer: Secondary | ICD-10-CM | POA: Diagnosis not present

## 2023-12-22 DIAGNOSIS — L97512 Non-pressure chronic ulcer of other part of right foot with fat layer exposed: Secondary | ICD-10-CM | POA: Diagnosis not present

## 2023-12-23 DIAGNOSIS — L97412 Non-pressure chronic ulcer of right heel and midfoot with fat layer exposed: Secondary | ICD-10-CM | POA: Diagnosis not present

## 2023-12-24 ENCOUNTER — Ambulatory Visit (HOSPITAL_COMMUNITY)
Admission: EM | Admit: 2023-12-24 | Discharge: 2023-12-24 | Disposition: A | Discharge status: Home / Self Care | Source: Ambulatory Visit | Attending: Emergency Medicine | Admitting: Emergency Medicine

## 2023-12-24 ENCOUNTER — Ambulatory Visit (INDEPENDENT_AMBULATORY_CARE_PROVIDER_SITE_OTHER)

## 2023-12-24 ENCOUNTER — Encounter (HOSPITAL_COMMUNITY): Payer: Self-pay

## 2023-12-24 DIAGNOSIS — L97511 Non-pressure chronic ulcer of other part of right foot limited to breakdown of skin: Secondary | ICD-10-CM

## 2023-12-24 DIAGNOSIS — E08621 Diabetes mellitus due to underlying condition with foot ulcer: Secondary | ICD-10-CM

## 2023-12-24 DIAGNOSIS — M7989 Other specified soft tissue disorders: Secondary | ICD-10-CM | POA: Diagnosis not present

## 2023-12-24 DIAGNOSIS — S91301A Unspecified open wound, right foot, initial encounter: Secondary | ICD-10-CM | POA: Diagnosis not present

## 2023-12-24 MED ORDER — DOXYCYCLINE HYCLATE 100 MG PO CAPS: 100.0000 mg | ORAL_CAPSULE | Freq: Two times a day (BID) | ORAL | 0 refills | Status: DC

## 2023-12-24 NOTE — ED Provider Notes (Signed)
 [facility]    CSN: [id number] Arrival date & time: 12/24/23  1222      History   Chief Complaint Chief Complaint  Patient presents with   Wound Check    HPI Erik [name] is a 83 y.o. male.   Patient presents for wound check of ulcer to his right foot that has been going on for about 2 weeks.  Patient states that he did see wound care on Monday and they redressed his wound, prescribed him antibiotics, and recommended that he come here for x-ray to rule out osteomyelitis related to ulcer. Patient states that he has not yet picked up the antibiotics and has not been changing out the dressings while at home.  Past medical history includes type 2 diabetes, hypertension, CKD, CHF, CAD, PAD, A-fib, hyperlipidemia, and GERD.  The history is provided by the patient and medical records.  Wound Check    Past Medical History:  Diagnosis Date   Acute on chronic renal failure (HCC)    Acute respiratory failure with hypoxia (HCC) 07/23/2023   Chest pain 09/23/2013   Chronic combined systolic and diastolic CHF (congestive heart failure) (HCC)    CKD (chronic kidney disease), stage III (HCC)    Coronary artery disease    a. Nonobstructive by cath 2004. b. Nuc 09/2013 - no ischemia, fixed defect suggestive of possible prior infarction, felt low risk, EF 48%.    COVID-19 virus infection 07/23/2023   Diabetes mellitus    GERD (gastroesophageal reflux disease)    Hyperlipidemia    Hypertension    NICM (nonischemic cardiomyopathy) (HCC)    a. EF 35-40% in 2009, 55% in 2010. b. 09/2013: 35-40% by echo and 48% by nuc. c. EF 50-55% by echo 12/2014.   PAD (peripheral artery disease) (HCC)    a. mild by noninvasive testing.   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    Peripheral arterial disease (HCC)    Premature ventricular contractions    Thrombocytopenia (HCC)    Trigeminal neuralgia    Type 2 diabetes mellitus with chronic kidney  disease, without long-term current use of insulin  (HCC) 12/02/2012    Patient Active Problem List   Diagnosis Date Noted   Acute on chronic diastolic CHF (congestive heart failure) (HCC) 10/30/2023   Pressure injury of skin 10/30/2023   Acute on chronic combined systolic (congestive) and diastolic (congestive) heart failure (HCC) 10/29/2023   Chronic heart failure with mildly reduced ejection fraction (HFmrEF, 41-49%) (HCC) 07/23/2023   Annual physical exam 08/16/2020   PAD (peripheral artery disease) (HCC) 11/02/2019   Visual field defect 08/12/2019   Pain due to onychomycosis of toenail of right foot 07/02/2019   Diabetic neuropathy (HCC) 03/31/2019   Plantar flexed metatarsal bone of left foot 03/31/2019   Plantar flexed metatarsal bone of right foot 03/31/2019   Porokeratosis 03/31/2019   Thrombophilia (HCC) 02/09/2019   Thrombocytopenia (HCC) 08/03/2018   Hyperlipidemia associated with type 2 diabetes mellitus (HCC)    Benign neoplasm of colon 04/07/2015   Non-thrombocytopenic purpura (HCC) 02/23/2015   Generalized anxiety disorder 02/09/2015   Localized edema 02/09/2015   Acute biliary pancreatitis    Pancreatitis 01/22/2015   Acute pancreatitis 01/22/2015   Abdominal pain    Gallstones    Acute gallstone pancreatitis    Actinic keratosis 07/13/2014   Low back pain 01/11/2014   Long term (current) use of anticoagulants 09/29/2013   Chronic kidney disease, stage 3b (  HCC)    Paroxysmal atrial fibrillation (HCC)    Coronary artery disease    NICM (nonischemic cardiomyopathy) (HCC)    Hypertension associated with diabetes (HCC)    GERD (gastroesophageal reflux disease)    Premature ventricular contractions    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    Elevated PSA 07/05/2013   Cellulitis 12/02/2012   Type 2 diabetes mellitus (HCC) 12/02/2012   Proteinuria 07/07/2012   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   Benign prostatic hyperplasia with lower  urinary tract symptoms 03/09/2009   Diabetic renal disease (HCC) 03/09/2009    Past Surgical History:  Procedure Laterality Date   CARDIAC CATHETERIZATION  10/25/00, 12/29/02   CHOLECYSTECTOMY N/A 01/25/2015   Procedure: LAPAROSCOPIC CHOLECYSTECTOMY ;  Surgeon: Vicenta Poli, MD;  Location: [facility] OR;  Service: General;  Laterality: N/A;   SHOULDER SURGERY Left 2011   Shoulder surgery, rod inserted       Home Medications    Prior to Admission medications   Medication Sig Start Date End Date Taking? Authorizing Provider  doxycycline  (VIBRAMYCIN ) 100 MG capsule Take 1 capsule (100 mg total) by mouth 2 (two) times daily. 12/24/23  Yes Johnie, Elisabella Hacker A, NP  acetaminophen  (TYLENOL ) 500 MG tablet Take 500 mg by mouth every 6 (six) hours as needed for mild pain (pain score 1-3), moderate pain (pain score 4-6) or fever.    [provider]  atorvastatin  (LIPITOR ) 40 MG tablet Take 40 mg by mouth in the morning.    [provider]  Coenzyme Q10 (COQ10 PO) Take 0.5 tablets by mouth in the morning.    [provider]  dapagliflozin  propanediol (FARXIGA ) 10 MG TABS tablet Take 10 mg by mouth in the morning.    [provider]  furosemide  (LASIX ) 40 MG tablet Take 1 tablet (40 mg total) by mouth 2 (two) times daily. 11/01/23   Darci Pore, MD  gabapentin  (NEURONTIN ) 300 MG capsule Take 1 capsule (300 mg total) by mouth 2 (two) times daily. 02/04/23   Patel, Donika K, DO  metoprolol  succinate (TOPROL  XL) 50 MG 24 hr tablet Take 1 tablet (50 mg total) by mouth daily. Take with or immediately following a meal. Patient taking differently: Take 75 mg by mouth in the morning. Take with or immediately following a meal. 04/08/23   Swinyer, Rosaline HERO, NP  Multiple Vitamin (MULTIVITAMIN) capsule Take 1 capsule by mouth in the morning.    [provider]  OXcarbazepine  (TRILEPTAL ) 150 MG tablet Take 1 tablet (150 mg total) by mouth 2 (two) times daily. Patient  taking differently: Take 150 mg by mouth 2 (two) times daily. Take with gabapentin  08/01/23   Patel, Donika K, DO  pantoprazole  (PROTONIX ) 40 MG tablet Take 40 mg by mouth in the morning.    [provider]  Rivaroxaban  (XARELTO ) 15 MG TABS tablet Take 1 tablet (15 mg total) by mouth daily with supper. Patient taking differently: Take 15 mg by mouth in the morning. 08/28/23   Wyn Jackee VEAR Mickey., NP  selenium 200 MCG TABS tablet Take 200 mcg by mouth as needed.    [provider]  tamsulosin  (FLOMAX ) 0.4 MG CAPS capsule Take 0.4 mg by mouth in the morning and at bedtime.    [provider]  TOUJEO  SOLOSTAR 300 UNIT/ML SOPN Inject 36 Units as directed in the morning. 03/12/16   [provider]    Family History Family History  Problem Relation Age of Onset  Heart disease Mother    Diabetes Mother    Hypertension Mother    Heart disease Father    Heart disease Brother    Diabetes Brother    Hypertension Brother    Heart attack Neg Hx    Stroke Neg Hx     Social History Social History   Tobacco Use   Smoking status: Former    Current packs/day: 0.00    Average packs/day: 0.5 packs/day for 10.0 years (5.0 ttl pk-yrs)    Types: Cigarettes    Start date: 01/22/1975    Quit date: 01/21/1985    Years since quitting: 38.9   Smokeless tobacco: Never   Tobacco comments:    quit 35 yrs ago  Vaping Use   Vaping status: Never Used  Substance Use Topics   Alcohol  use: No   Drug use: No     Allergies   Claritin [loratadine], Tradjenta [linagliptin], and Imipramine    Review of Systems Review of Systems  Per HPI  Physical Exam Triage Vital Signs ED Triage Vitals  Encounter Vitals Group     BP 12/24/23 1303 (!) 123/52     Girls Systolic BP Percentile --      Girls Diastolic BP Percentile --      Boys Systolic BP Percentile --      Boys Diastolic BP Percentile --      Pulse Rate 12/24/23 1303 68     Resp 12/24/23 1303 18     Temp 12/24/23  1303 98.4 F (36.9 C)     Temp Source 12/24/23 1303 Oral     SpO2 12/24/23 1303 97 %     Weight --      Height --      Head Circumference --      Peak Flow --      Pain Score 12/24/23 1304 0     Pain Loc --      Pain Education --      Exclude from Growth Chart --    No data found.  Updated Vital Signs BP (!) 123/52 (BP Location: Left Arm)   Pulse 68   Temp 98.4 F (36.9 C) (Oral)   Resp 18   SpO2 97%   Visual Acuity Right Eye Distance:   Left Eye Distance:   Bilateral Distance:    Right Eye Near:   Left Eye Near:    Bilateral Near:     Physical Exam Vitals and nursing note reviewed.  Constitutional:      General: He is awake. He is not in acute distress.    Appearance: Normal appearance. He is well-developed and well-groomed. He is not ill-appearing.  Feet:     Right foot:     Skin integrity: Ulcer, skin breakdown and erythema present.     Comments: Ulcer with purulent drainage noted to right lateral aspect of right foot with surrounding erythema and skin breakdown. Skin:    General: Skin is warm and dry.  Neurological:     Mental Status: He is alert.  Psychiatric:        Behavior: Behavior is cooperative.      UC Treatments / Results  Labs (all labs ordered are listed, but only abnormal results are displayed) Labs Reviewed - No data to display  EKG   Radiology DG Foot Complete Right Result Date: 12/24/2023 CLINICAL DATA:  Right foot wound, rule out osteo EXAM: RIGHT FOOT COMPLETE - 3+ VIEW COMPARISON:  None Available. FINDINGS: No acute fracture or dislocation.  There is no evidence of arthropathy or other focal bone abnormality. Soft tissue swelling along the dorsum of the foot. No radiopaque foreign body. IMPRESSION: Soft tissue swelling about the dorsum of the foot. No radiographic findings of osteomyelitis, at this time. Electronically Signed   By: Rogelia Myers M.D.   On: 12/24/2023 14:08    Procedures Procedures (including critical care  time)  Medications Ordered in UC Medications - No data to display  Initial Impression / Assessment and Plan / UC Course  I have reviewed the triage vital signs and the nursing notes.  Pertinent labs & imaging results that were available during my care of the patient were reviewed by me and considered in my medical decision making (see chart for details).     Patient is overall well-appearing.  Vitals are stable.  Exam findings consistent with previously diagnosed diabetic ulcer.  X-ray ordered to rule out underlying osteomyelitis per wound care request.  Based on interpretation there is no evidence of underlying osteomyelitis.  Radiology report confirms this.  Provided basic wound care and dressing in clinic.  Prescribe doxycycline  for infection coverage related to ulcer.  Discussed proper wound care at home.  Discussed follow-up, return, and strict ER precautions. Final Clinical Impressions(s) / UC Diagnoses   Final diagnoses:  Diabetic ulcer of right foot associated with diabetes mellitus due to underlying condition, limited to breakdown of skin, unspecified part of foot [facility])     Discharge Instructions      Your x-ray is negative for any signs of underlying infection to the bone.  Start taking doxycycline  twice daily for 10 days for infection coverage related to ulcer. Be sure that you are keeping the area clean dry and covered. Change out the wound dressing at least once daily or when you notice that is saturated to avoid excessive moisture to the area. Continue with follow-up appointment with wound care.   If you notice spreading redness, loss of purulent drainage, swelling, increased pain, develop a fever or weakness please seek immediate medical treatment in the emergency department.     ED Prescriptions     Medication Sig Dispense Auth. Provider   doxycycline  (VIBRAMYCIN ) 100 MG capsule Take 1 capsule (100 mg total) by mouth 2 (two) times daily. 20 capsule Johnie Flaming A, NP      PDMP not reviewed this encounter.   Johnie Flaming A, NP 12/24/23 [phone]

## 2023-12-24 NOTE — Discharge Instructions (Signed)
 Your x-ray is negative for any signs of underlying infection to the bone.  Start taking doxycycline  twice daily for 10 days for infection coverage related to ulcer. Be sure that you are keeping the area clean dry and covered. Change out the wound dressing at least once daily or when you notice that is saturated to avoid excessive moisture to the area. Continue with follow-up appointment with wound care.   If you notice spreading redness, loss of purulent drainage, swelling, increased pain, develop a fever or weakness please seek immediate medical treatment in the emergency department.

## 2023-12-24 NOTE — ED Triage Notes (Addendum)
 Pt c/o wound to side of rt foot has reopened about 2wks ago and not getting better. Denies injury. States it got wet when we had lots rain and wet it again yesterday. Pt states was seen at wound care center on Monday and needs a x-ray.

## 2023-12-29 ENCOUNTER — Encounter (HOSPITAL_BASED_OUTPATIENT_CLINIC_OR_DEPARTMENT_OTHER): Attending: Internal Medicine | Admitting: Internal Medicine

## 2023-12-29 DIAGNOSIS — I87321 Chronic venous hypertension (idiopathic) with inflammation of right lower extremity: Secondary | ICD-10-CM | POA: Insufficient documentation

## 2023-12-29 DIAGNOSIS — I87322 Chronic venous hypertension (idiopathic) with inflammation of left lower extremity: Secondary | ICD-10-CM | POA: Diagnosis not present

## 2023-12-29 DIAGNOSIS — S91302A Unspecified open wound, left foot, initial encounter: Secondary | ICD-10-CM | POA: Insufficient documentation

## 2023-12-29 DIAGNOSIS — L03115 Cellulitis of right lower limb: Secondary | ICD-10-CM | POA: Insufficient documentation

## 2023-12-29 DIAGNOSIS — X58XXXA Exposure to other specified factors, initial encounter: Secondary | ICD-10-CM | POA: Insufficient documentation

## 2023-12-29 DIAGNOSIS — E11621 Type 2 diabetes mellitus with foot ulcer: Secondary | ICD-10-CM | POA: Diagnosis not present

## 2023-12-29 DIAGNOSIS — L97518 Non-pressure chronic ulcer of other part of right foot with other specified severity: Secondary | ICD-10-CM | POA: Diagnosis not present

## 2024-01-05 ENCOUNTER — Encounter (HOSPITAL_BASED_OUTPATIENT_CLINIC_OR_DEPARTMENT_OTHER): Admitting: Internal Medicine

## 2024-01-05 DIAGNOSIS — L97522 Non-pressure chronic ulcer of other part of left foot with fat layer exposed: Secondary | ICD-10-CM | POA: Diagnosis not present

## 2024-01-05 DIAGNOSIS — E11621 Type 2 diabetes mellitus with foot ulcer: Secondary | ICD-10-CM | POA: Diagnosis not present

## 2024-01-12 ENCOUNTER — Encounter (HOSPITAL_BASED_OUTPATIENT_CLINIC_OR_DEPARTMENT_OTHER): Admitting: Internal Medicine

## 2024-01-12 DIAGNOSIS — I87321 Chronic venous hypertension (idiopathic) with inflammation of right lower extremity: Secondary | ICD-10-CM

## 2024-01-12 DIAGNOSIS — L97518 Non-pressure chronic ulcer of other part of right foot with other specified severity: Secondary | ICD-10-CM | POA: Diagnosis not present

## 2024-01-12 DIAGNOSIS — E11621 Type 2 diabetes mellitus with foot ulcer: Secondary | ICD-10-CM | POA: Diagnosis not present

## 2024-01-12 DIAGNOSIS — S91302A Unspecified open wound, left foot, initial encounter: Secondary | ICD-10-CM | POA: Diagnosis not present

## 2024-01-19 ENCOUNTER — Encounter (HOSPITAL_BASED_OUTPATIENT_CLINIC_OR_DEPARTMENT_OTHER): Admitting: Internal Medicine

## 2024-01-19 DIAGNOSIS — E11621 Type 2 diabetes mellitus with foot ulcer: Secondary | ICD-10-CM

## 2024-01-19 DIAGNOSIS — S91302A Unspecified open wound, left foot, initial encounter: Secondary | ICD-10-CM | POA: Diagnosis not present

## 2024-01-19 DIAGNOSIS — I87321 Chronic venous hypertension (idiopathic) with inflammation of right lower extremity: Secondary | ICD-10-CM | POA: Diagnosis not present

## 2024-01-19 DIAGNOSIS — L97518 Non-pressure chronic ulcer of other part of right foot with other specified severity: Secondary | ICD-10-CM

## 2024-01-29 ENCOUNTER — Ambulatory Visit (HOSPITAL_BASED_OUTPATIENT_CLINIC_OR_DEPARTMENT_OTHER): Admitting: Internal Medicine

## 2024-02-05 ENCOUNTER — Encounter (HOSPITAL_BASED_OUTPATIENT_CLINIC_OR_DEPARTMENT_OTHER): Attending: Internal Medicine | Admitting: Internal Medicine

## 2024-02-05 DIAGNOSIS — I87301 Chronic venous hypertension (idiopathic) without complications of right lower extremity: Secondary | ICD-10-CM | POA: Diagnosis not present

## 2024-02-05 DIAGNOSIS — S91302A Unspecified open wound, left foot, initial encounter: Secondary | ICD-10-CM | POA: Insufficient documentation

## 2024-02-05 DIAGNOSIS — E11621 Type 2 diabetes mellitus with foot ulcer: Secondary | ICD-10-CM | POA: Diagnosis not present

## 2024-02-05 DIAGNOSIS — X58XXXA Exposure to other specified factors, initial encounter: Secondary | ICD-10-CM | POA: Insufficient documentation

## 2024-02-05 DIAGNOSIS — I87312 Chronic venous hypertension (idiopathic) with ulcer of left lower extremity: Secondary | ICD-10-CM | POA: Diagnosis not present

## 2024-02-05 DIAGNOSIS — L97518 Non-pressure chronic ulcer of other part of right foot with other specified severity: Secondary | ICD-10-CM | POA: Diagnosis not present

## 2024-02-09 ENCOUNTER — Ambulatory Visit (INDEPENDENT_AMBULATORY_CARE_PROVIDER_SITE_OTHER): Payer: PPO | Admitting: Neurology

## 2024-02-09 ENCOUNTER — Other Ambulatory Visit (HOSPITAL_COMMUNITY): Payer: Self-pay

## 2024-02-09 ENCOUNTER — Encounter: Payer: Self-pay | Admitting: Neurology

## 2024-02-09 VITALS — BP 146/63 | HR 79 | Ht 70.0 in | Wt 190.0 lb

## 2024-02-09 DIAGNOSIS — G500 Trigeminal neuralgia: Secondary | ICD-10-CM | POA: Diagnosis not present

## 2024-02-09 MED ORDER — GABAPENTIN 300 MG PO CAPS
300.0000 mg | ORAL_CAPSULE | Freq: Two times a day (BID) | ORAL | 3 refills | Status: AC
  Filled 2024-02-09: qty 180, 90d supply, fill #0

## 2024-02-09 MED ORDER — OXCARBAZEPINE 150 MG PO TABS
150.0000 mg | ORAL_TABLET | Freq: Two times a day (BID) | ORAL | 3 refills | Status: AC
  Filled 2024-02-09: qty 180, 90d supply, fill #0

## 2024-02-09 NOTE — Progress Notes (Signed)
 Follow-up Visit   Date: 02/09/24    Erik [name] MRN: [id number] DOB: 1940-06-22   Interim History: Erik [name] is a 83 y.o. right-handed Caucasian male with atrial fibrillation, hyperlipidemia, hypertension, GERD, CAD, CHF (EF 30%), diabetes mellitus, and BPH returning to the clinic for follow-up of right trigeminal neuralgia.  The patient was accompanied to the clinic by self.  IMPRESSION/PLAN: Right trigeminal neuralgia, well-controlled.  He has spells of pain since 2000s.  MRI in May 2019 which does not show trigeminal nerve compression/vascular loop.  He has been pain-free since Jan 2025.  - Continue gabapentin  300mg  twice daily  - Continue oxcarbamazepine 150mg  twice daily  2.  Diabetic neuropathy with numbness in the feet and lower legs complicated by bilateral heel ulcers.  He is followed by wound care. Patient educated on daily foot inspection, fall prevention, and safety precautions around the home.  Return to clinic in 1 year  ------------------------------------------------------ History of present illness: Since 2000, he had had three spells of trigeminal neuralgia, the most recent episode started 6 months ago. He has extreme sensitivity of the right temple, jaw, cheek, lips, and nose on the right side.  It feels as if he is touching an electric fence.  Symptoms are intermittent and occur daily, lasting about 5-15 seconds.  If he tries to be still, he feels that it alleviates pain quicker.  His pain is exacerbated by brushing his teeth, combing hair, shampooing on the right scalp, and chewing. He was started on carbamazepine  100mg  twice daily and does not appreciate any improvement.  In the past, he used Tegretol  and had symptoms resolve within 6 weeks.    UPDATE 11/28/2016:    He did not have any benefit with Tegretol  200mg  BID so he was started on gabapentin  300mg  BID and endorses marked improvement.  He now gets very transient facial pain, lasting only a few seconds  and occurring once every few weeks.  He has no new complaints.   UPDATE 06/20/2021:  He was doing well and pain resolved for 3 years.  About a month ago, he started having right sided shooting pain over the face, similar to what he has experienced in the past. It is worse when he opens his jaw wide, such as when eating a hamburger. His PCP started him on gabapentin  100mg  TID which has reduced the frequency of pain to 2-3 times per day.  He now had a dull toothache pain over the side of the face.    UPDATE 02/09/2024:  He is here for follow-up visit.  He has worsening facial pain earlier this year and oxcarbamazepine was increased to 150mg  twice daily which helped.   He is very compliant with taking his medications - oxcarbamazepine 150mg  twice daily and gabapentin  300mg  twice daily.  He have diabetic ulcers at the heel of both feet and has wound care coming weekly.    Medications:  Current Outpatient Medications on File Prior to Visit  Medication Sig Dispense Refill   acetaminophen  (TYLENOL ) 500 MG tablet Take 500 mg by mouth every 6 (six) hours as needed for mild pain (pain score 1-3), moderate pain (pain score 4-6) or fever.     atorvastatin  (LIPITOR ) 40 MG tablet Take 40 mg by mouth in the morning.     Coenzyme Q10 (COQ10 PO) Take 0.5 tablets by mouth in the morning.     dapagliflozin  propanediol (FARXIGA ) 10 MG TABS tablet Take 10 mg by mouth in the morning.  furosemide  (LASIX ) 40 MG tablet Take 1 tablet (40 mg total) by mouth 2 (two) times daily. 30 tablet 3   gabapentin  (NEURONTIN ) 300 MG capsule Take 1 capsule (300 mg total) by mouth 2 (two) times daily. 180 capsule 3   metoprolol  succinate (TOPROL  XL) 50 MG 24 hr tablet Take 1 tablet (50 mg total) by mouth daily. Take with or immediately following a meal.     Multiple Vitamin (MULTIVITAMIN) capsule Take 1 capsule by mouth in the morning.     OXcarbazepine  (TRILEPTAL ) 150 MG tablet Take 1 tablet (150 mg total) by mouth 2 (two) times daily.  180 tablet 1   pantoprazole  (PROTONIX ) 40 MG tablet Take 40 mg by mouth in the morning.     Rivaroxaban  (XARELTO ) 15 MG TABS tablet Take 1 tablet (15 mg total) by mouth daily with supper. 14 tablet 0   selenium 200 MCG TABS tablet Take 200 mcg by mouth as needed.     tamsulosin  (FLOMAX ) 0.4 MG CAPS capsule Take 0.4 mg by mouth in the morning and at bedtime.     TOUJEO  SOLOSTAR 300 UNIT/ML SOPN Inject 36 Units as directed in the morning.  4   doxycycline  (VIBRAMYCIN ) 100 MG capsule Take 1 capsule (100 mg total) by mouth 2 (two) times daily. (Patient not taking: Reported on 02/09/2024) 20 capsule 0   No current facility-administered medications on file prior to visit.    Allergies:  Allergies  Allergen Reactions   Claritin [Loratadine] Other (See Comments)    Urinary retention   Tradjenta [Linagliptin] Other (See Comments)    High blood sugar   Imipramine  Other (See Comments)    Other reaction(s): hallucination/dizziness     Vital Signs:  BP (!) 146/63   Pulse 79   Ht 5' 10 (1.778 m)   Wt 190 lb (86.2 kg)   SpO2 97%   BMI 27.26 kg/m   Neurological Exam: MENTAL STATUS including orientation to time, place, person, recent and remote memory, attention span and concentration, language, and fund of knowledge is normal.  Speech is not dysarthric.  CRANIAL NERVES:  Pupils equal round and reactive to light.  Normal conjugate, extra-ocular eye movements in all directions of gaze.  No ptosis. Normal facial sensation.  Face is symmetric.  MOTOR:  Motor strength is 5/5 in all extremities.    COORDINATION/GAIT:   Gait narrow based and stable.   Data:  MRI orbit wwo contrast 10/11/2017: Negative for orbital mass. Question mild enhancement of the eyelid bilaterally which could be due to inflammation. This is symmetric and could also be due to artifact on the postcontrast images related to fat suppression technique. 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       Thank you  for allowing me to participate in patient's care.  If I can answer any additional questions, I would be pleased to do so.    Sincerely,    Lota Leamer K. Tobie, DO

## 2024-02-12 ENCOUNTER — Encounter (HOSPITAL_BASED_OUTPATIENT_CLINIC_OR_DEPARTMENT_OTHER): Admitting: Internal Medicine

## 2024-02-12 DIAGNOSIS — L97518 Non-pressure chronic ulcer of other part of right foot with other specified severity: Secondary | ICD-10-CM

## 2024-02-12 DIAGNOSIS — S91302A Unspecified open wound, left foot, initial encounter: Secondary | ICD-10-CM

## 2024-02-12 DIAGNOSIS — I87301 Chronic venous hypertension (idiopathic) without complications of right lower extremity: Secondary | ICD-10-CM

## 2024-02-12 DIAGNOSIS — I87312 Chronic venous hypertension (idiopathic) with ulcer of left lower extremity: Secondary | ICD-10-CM

## 2024-02-12 DIAGNOSIS — E11621 Type 2 diabetes mellitus with foot ulcer: Secondary | ICD-10-CM | POA: Diagnosis not present

## 2024-02-19 ENCOUNTER — Encounter (HOSPITAL_BASED_OUTPATIENT_CLINIC_OR_DEPARTMENT_OTHER): Admitting: Internal Medicine

## 2024-02-19 DIAGNOSIS — E11621 Type 2 diabetes mellitus with foot ulcer: Secondary | ICD-10-CM | POA: Diagnosis not present

## 2024-02-19 DIAGNOSIS — I87312 Chronic venous hypertension (idiopathic) with ulcer of left lower extremity: Secondary | ICD-10-CM

## 2024-03-16 ENCOUNTER — Encounter: Payer: Self-pay | Admitting: Physician Assistant

## 2024-03-16 ENCOUNTER — Ambulatory Visit: Attending: Physician Assistant | Admitting: Physician Assistant

## 2024-03-16 VITALS — BP 102/52 | HR 77 | Resp 16 | Ht 70.0 in | Wt 197.4 lb

## 2024-03-16 DIAGNOSIS — I2583 Coronary atherosclerosis due to lipid rich plaque: Secondary | ICD-10-CM

## 2024-03-16 DIAGNOSIS — E782 Mixed hyperlipidemia: Secondary | ICD-10-CM

## 2024-03-16 DIAGNOSIS — I480 Paroxysmal atrial fibrillation: Secondary | ICD-10-CM | POA: Diagnosis not present

## 2024-03-16 DIAGNOSIS — I2510 Atherosclerotic heart disease of native coronary artery without angina pectoris: Secondary | ICD-10-CM

## 2024-03-16 DIAGNOSIS — I5043 Acute on chronic combined systolic (congestive) and diastolic (congestive) heart failure: Secondary | ICD-10-CM | POA: Diagnosis not present

## 2024-03-16 DIAGNOSIS — Z79899 Other long term (current) drug therapy: Secondary | ICD-10-CM

## 2024-03-16 DIAGNOSIS — I5022 Chronic systolic (congestive) heart failure: Secondary | ICD-10-CM

## 2024-03-16 DIAGNOSIS — I10 Essential (primary) hypertension: Secondary | ICD-10-CM

## 2024-03-16 DIAGNOSIS — N183 Chronic kidney disease, stage 3 unspecified: Secondary | ICD-10-CM | POA: Diagnosis not present

## 2024-03-16 LAB — CBC
Hematocrit: 30.7 % — ABNORMAL LOW (ref 37.5–51.0)
Hemoglobin: 9.5 g/dL — ABNORMAL LOW (ref 13.0–17.7)
MCH: 27.4 pg (ref 26.6–33.0)
MCHC: 30.9 g/dL — ABNORMAL LOW (ref 31.5–35.7)
MCV: 89 fL (ref 79–97)
Platelets: 142 x10E3/uL — ABNORMAL LOW (ref 150–450)
RBC: 3.47 x10E6/uL — ABNORMAL LOW (ref 4.14–5.80)
RDW: 15.5 % — ABNORMAL HIGH (ref 11.6–15.4)
WBC: 4.5 x10E3/uL (ref 3.4–10.8)

## 2024-03-16 MED ORDER — METOPROLOL SUCCINATE ER 25 MG PO TB24: 25.0000 mg | ORAL_TABLET | Freq: Every day | ORAL | 3 refills | Status: AC

## 2024-03-16 MED ORDER — DAPAGLIFLOZIN PROPANEDIOL 10 MG PO TABS: 10.0000 mg | ORAL_TABLET | Freq: Every morning | ORAL | 2 refills | Status: AC

## 2024-03-16 MED ORDER — RIVAROXABAN 15 MG PO TABS: 15.0000 mg | ORAL_TABLET | Freq: Every day | ORAL | 2 refills | Status: AC

## 2024-03-16 NOTE — Progress Notes (Signed)
 Office Visit    Patient Name: Erik [name] Date of Encounter: 03/16/2024  PCP:  Vernadine Charlie LELON, MD   [facility]  Cardiologist:  None  Advanced Practice Provider:  No care team member to display Electrophysiologist:  None  HPI    Erik [name] is a 83 y.o. male with past medical history significant for nonobstructive CAD, PVCs, paroxysmal atrial fibrillation, nonischemic cardiomyopathy (EF 35 to 40% 2009, 55% (10, 35 to 45% in 2015 during admission for atrial fibrillation with RVR and 50 to 55% in 2016), CKD 3, DM 2, HTN, HLD who was previously followed by Dr. Maranda presents today for follow-up appointment.  Per review of record, patient history of NICM with lowest EF 35 to 40% in 2009 and most recent EF 50 to 55% in 2016.  Also have a history of atrial fibrillation on Xarelto  for AC.  Was last seen in the clinic 4/23 where he was doing well from a CV standpoint.  He was having neuropathic pain.  TTE 08/2021 for monitoring of MR showed 45 to 50% LVEF with global hypokinesis, normal RV function mildly enlarged size, mild pulmonary hypertension, mild MR.  He was last seen 04/10/2022 and was doing well at that time.  He continued to have bilateral lower extremity edema right greater than left x2 to 3 months.  He reports that his nephrologist started him on Lasix  40 mg daily less than 2 weeks ago and his symptoms have slowly been improving.  He sees his nephrologist again in January for labs.  He often wears compression socks with relief as well.  Denies any orthopnea (sleeping on 1 pillow), PND, or worsening DOE or chest pain.  Discussed echocardiogram results during that visit from 4/23.  I saw him back in November of 2023, he tells me that he lost about 4 pounds of fluid and feels much better.  He states his lower extremity edema has resolved.  He tells me that he tries to remain active but his activity level is limited by his left arm mobility.  He does have  a small amount of residual edema on exam today but overall is doing much better.  We have encouraged him to continue to take his Lasix  40 mg daily and keep track of his weight.  Reports no shortness of breath nor dyspnea on exertion. Reports no chest pain, pressure, or tightness. No edema, orthopnea, PND. Reports no palpitations.   More recently he was seen by Jackee Alberts, NP 08/28/23.  He had been monitoring his weight and fluid status and was on Neurontin  prescribed by her neurologist.  It makes him sleepy but no other side effects.  Also on Farxiga  for diabetes management.  He described an incident where he fell in the bathroom and another time outside requiring assistance to get up.  Has a history of a heel wound that was requiring dressing changes which has since healed.  He did not strike his head with either fall per patient and his son.  Has been off Xarelto  for approximately a year he also mention a previous prescription for Cardizem  which helped with heart rate control.  Today, he presents with a hx of atrial fibrillation and chronic kidney disease with weakness and fluid retention.  He experiences weakness and decreased energy for the past week and a half, with no chest pain or heart discomfort. Atrial fibrillation has been present for thirty years, managed with Xarelto  and metoprolol . Fluid retention, especially in  the legs, has improved with Lasix . Previous weeping in both legs is noted. He takes furosemide  40 mg twice daily and Farxiga  for fluid management (although he later told me he was out of this medication). Chronic kidney disease is managed under nephrology care. Weight has increased to 197 pounds from 167 pounds, attributed to fluid retention but could also be some dietary indiscretion. Current medications include metoprolol , Lasix , Farxiga , Xarelto , Lipitor , insulin , gabapentin , Protonix , and Flomax . He is concerned about the number of medications. Spirolactone was mentioned but no  started due to renal function.   Reports no shortness of breath nor dyspnea on exertion. Reports no chest pain, pressure, or tightness. No edema, orthopnea, PND. Reports no palpitations.   Discussed the use of AI scribe software for clinical note transcription with the patient, who gave verbal consent to proceed.   Past Medical History    Past Medical History:  Diagnosis Date   Acute on chronic renal failure    Acute respiratory failure with hypoxia (HCC) 07/23/2023   Chest pain 09/23/2013   Chronic combined systolic and diastolic CHF (congestive heart failure) (HCC)    CKD (chronic kidney disease), stage III (HCC)    Coronary artery disease    a. Nonobstructive by cath 2004. b. Nuc 09/2013 - no ischemia, fixed defect suggestive of possible prior infarction, felt low risk, EF 48%.    COVID-19 virus infection 07/23/2023   Diabetes mellitus    GERD (gastroesophageal reflux disease)    Hyperlipidemia    Hypertension    NICM (nonischemic cardiomyopathy) (HCC)    a. EF 35-40% in 2009, 55% in 2010. b. 09/2013: 35-40% by echo and 48% by nuc. c. EF 50-55% by echo 12/2014.   PAD (peripheral artery disease)    a. mild by noninvasive testing.   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    Peripheral arterial disease    Premature ventricular contractions    Thrombocytopenia    Trigeminal neuralgia    Type 2 diabetes mellitus with chronic kidney disease, without long-term current use of insulin  (HCC) 12/02/2012   Past Surgical History:  Procedure Laterality Date   CARDIAC CATHETERIZATION  10/25/00, 12/29/02   CHOLECYSTECTOMY N/A 01/25/2015   Procedure: LAPAROSCOPIC CHOLECYSTECTOMY ;  Surgeon: Vicenta Poli, MD;  Location: [facility] OR;  Service: General;  Laterality: N/A;   SHOULDER SURGERY Left 2011   Shoulder surgery, rod inserted    Allergies  Allergies  Allergen Reactions   Claritin [Loratadine] Other (See Comments)    Urinary retention    Tradjenta [Linagliptin] Other (See Comments)    High blood sugar   Imipramine  Other (See Comments)    Other reaction(s): hallucination/dizziness    EKGs/Labs/Other Studies Reviewed:   The following studies were reviewed today:  Echocardiogram  09/19/21  IMPRESSIONS     1. Left ventricular ejection fraction, by estimation, is 45 to 50%. The  left ventricle has mildly decreased function. The left ventricle  demonstrates global hypokinesis. Left ventricular diastolic parameters are  consistent with Grade I diastolic  dysfunction (impaired relaxation). The average left ventricular global  longitudinal strain is -17.9 %.   2. Right ventricular systolic function is normal. The right ventricular  size is mildly enlarged. There is mildly elevated pulmonary artery  systolic pressure.   3. Left atrial size was mildly dilated.   4. The mitral valve is grossly normal. Mild mitral valve regurgitation.   5. The aortic valve is tricuspid. There is mild calcification  of the  aortic valve. There is mild thickening of the aortic valve. Aortic valve  regurgitation is trivial. Aortic valve sclerosis/calcification is present,  without any evidence of aortic  stenosis.   Comparison(s): TTE 01/23/15 EF 50-55%, mild MR.   FINDINGS   Left Ventricle: Left ventricular ejection fraction, by estimation, is 45  to 50%. The left ventricle has mildly decreased function. The left  ventricle demonstrates global hypokinesis. The average left ventricular  global longitudinal strain is -17.9 %. 3D   left ventricular ejection fraction analysis performed but not reported  based on interpreter judgement due to suboptimal tracking. The left  ventricular internal cavity size was normal in size. There is no left  ventricular hypertrophy. Left ventricular  diastolic parameters are consistent with Grade I diastolic dysfunction  (impaired relaxation).   Right Ventricle: The right ventricular size is mildly enlarged.  No  increase in right ventricular wall thickness. Right ventricular systolic  function is normal. There is mildly elevated pulmonary artery systolic  pressure. The tricuspid regurgitant  velocity is 3.16 m/s, and with an assumed right atrial pressure of 3 mmHg,  the estimated right ventricular systolic pressure is 42.9 mmHg.   Left Atrium: Left atrial size was mildly dilated.   Right Atrium: Right atrial size was normal in size.   Pericardium: There is no evidence of pericardial effusion.   Mitral Valve: The mitral valve is grossly normal. There is mild thickening  of the mitral valve leaflet(s). There is mild calcification of the mitral  valve leaflet(s). Mild mitral annular calcification. Mild mitral valve  regurgitation.   Tricuspid Valve: The tricuspid valve is normal in structure. Tricuspid  valve regurgitation is mild.   Aortic Valve: The aortic valve is tricuspid. There is mild calcification  of the aortic valve. There is mild thickening of the aortic valve. Aortic  valve regurgitation is trivial. Aortic valve sclerosis/calcification is  present, without any evidence of  aortic stenosis.   Pulmonic Valve: The pulmonic valve was normal in structure. Pulmonic valve  regurgitation is trivial.   Aorta: The aortic root and ascending aorta are structurally normal, with  no evidence of dilitation.   Venous: The inferior vena cava was not well visualized.   IAS/Shunts: The atrial septum is grossly normal.   EKG:  EKG is not ordered today.   Recent Labs: 10/29/2023: B Natriuretic Peptide 646.5 10/30/2023: ALT 7 11/01/2023: BUN 27; Creatinine, Ser 1.99; Hemoglobin 9.7; Magnesium  2.1; Platelets 154; Potassium 3.8; Sodium 134  Recent Lipid Panel    Component Value Date/Time   CHOL 115 09/03/2021 0839   TRIG 95 09/03/2021 0839   HDL 42 09/03/2021 0839   CHOLHDL 2.7 09/03/2021 0839   LDLCALC 55 09/03/2021 0839     Home Medications   Current Meds  Medication Sig    acetaminophen  (TYLENOL ) 500 MG tablet Take 500 mg by mouth every 6 (six) hours as needed for mild pain (pain score 1-3), moderate pain (pain score 4-6) or fever.   atorvastatin  (LIPITOR ) 40 MG tablet Take 40 mg by mouth in the morning.   Coenzyme Q10 (COQ10 PO) Take 0.5 tablets by mouth in the morning.   furosemide  (LASIX ) 40 MG tablet Take 1 tablet (40 mg total) by mouth 2 (two) times daily.   gabapentin  (NEURONTIN ) 300 MG capsule Take 1 capsule (300 mg total) by mouth 2 (two) times daily.   metoprolol  succinate (TOPROL  XL) 50 MG 24 hr tablet Take 1 tablet (50 mg total) by mouth daily. Take with  or immediately following a meal.   Multiple Vitamin (MULTIVITAMIN) capsule Take 1 capsule by mouth in the morning.   OXcarbazepine  (TRILEPTAL ) 150 MG tablet Take 1 tablet (150 mg total) by mouth 2 (two) times daily.   pantoprazole  (PROTONIX ) 40 MG tablet Take 40 mg by mouth in the morning.   selenium 200 MCG TABS tablet Take 200 mcg by mouth as needed.   tamsulosin  (FLOMAX ) 0.4 MG CAPS capsule Take 0.4 mg by mouth in the morning and at bedtime.   TOUJEO  SOLOSTAR 300 UNIT/ML SOPN Inject 36 Units as directed in the morning.   [DISCONTINUED] dapagliflozin  propanediol (FARXIGA ) 10 MG TABS tablet Take 10 mg by mouth in the morning.   [DISCONTINUED] Rivaroxaban  (XARELTO ) 15 MG TABS tablet Take 1 tablet (15 mg total) by mouth daily with supper.     Review of Systems      All other systems reviewed and are otherwise negative except as noted above.  Physical Exam    VS:  BP (!) 102/52 (BP Location: Left Arm, Patient Position: Sitting, Cuff Size: Normal)   Pulse 77   Resp 16   Ht 5' 10 (1.778 m)   Wt 197 lb 6.4 oz (89.5 kg)   SpO2 93%   BMI 28.32 kg/m  , BMI Body mass index is 28.32 kg/m.  Wt Readings from Last 3 Encounters:  03/16/24 197 lb 6.4 oz (89.5 kg)  02/09/24 190 lb (86.2 kg)  11/01/23 191 lb 12.8 oz (87 kg)     GEN: Well nourished, well developed, in no acute distress. HEENT:  normal. Neck: Supple, no JVD, carotid bruits, or masses. Cardiac: IRIR, no murmurs, rubs, or gallops. No clubbing, cyanosis, trace bilateral edema.  Radials/PT 2+ and equal bilaterally.  Respiratory:  Respirations regular and unlabored, clear to auscultation bilaterally. GI: Soft, nontender, nondistended. MS: No deformity or atrophy. Skin: Warm and dry, no rash. Neuro:  Strength and sensation are intact. Psych: Normal affect.  Assessment & Plan     Chronic Combined CHF Non-Ischemic Cardiomyopathy EF as low as 35-40% in the past. Last Echo in 08/2021 showed LVEF of 45-50% with global hypokinesis and grade 1 diastolic dysfunction, mildly enlarged RV function with normal RV function, and mild MR. - Euvolemic on exam.  - Change metoprolol  succinate 50mg  down to 25mg  to give more BP room with chronic fatigue -Continue Farxiga  10mg  daily  -BMET tomorrow with nephrology  - No ACEi/ARB/ ARNI or MRA due to renal function.  - Continue daily weight and sodium/ fluid restrictions.  Peripheral edema Peripheral edema in both legs, improved with Lasix . Fluid retention likely due to heart failure and kidney disease. - Continue current diuretic regimen with Lasix  and Farxiga .  Paroxysmal atrial fibrillation on anticoagulation Chronic paroxysmal atrial fibrillation, currently in atrial fibrillation with a controlled rate in the 70s. He is on Xarelto  for stroke prevention. Uncertainty about the current prescription status of Xarelto , but he reports taking it. - Send new prescription for Xarelto . - Ensure CBC is ordered to monitor for potential side effects of anticoagulation.  Chronic kidney disease stage 3 Chronic kidney disease stage 3, contributing to fluid retention and complicating diuretic management. Risk of worsening kidney function with increased diuretics is a concern. - Follow up with nephrology to assess kidney function and diuretic management. - Consider checking updated labs including  kidney function and CBC.  Atherosclerotic heart disease of native coronary artery without angina Atherosclerotic heart disease without current angina symptoms. - Continue Lipitor  for cholesterol management.  Hyperlipidemia -  LDL 55, continue medication regimen       Disposition: Follow up in 6 months with Dr. Kriste  Signed, Orren LOISE Fabry, PA-C 03/16/2024, 2:34 PM [facility]

## 2024-03-16 NOTE — Patient Instructions (Addendum)
 Medication Instructions:  DECREASE Metoprolol  to 25mg  Take 1 tablet once a day  RESTART Xarelto  15mg  Take 1 tablet once a day RESTART Farxiga  10mg  Take 1 tablet once a day  *If you need a refill on your cardiac medications before your next appointment, please call your pharmacy*  Lab Work: TODAY-CBC HAVE YOUR PCP SEND OUR OFFICE A COPY OF YOUR LABS If you have labs (blood work) drawn today and your tests are completely normal, you will receive your results only by: MyChart Message (if you have MyChart) OR A paper copy in the mail If you have any lab test that is abnormal or we need to change your treatment, we will call you to review the results.  Testing/Procedures: None Ordered  Follow-Up: At [facility], you and your health needs are our priority.  As part of our continuing mission to provide you with exceptional heart care, our providers are all part of one team.  This team includes your primary Cardiologist (physician) and Advanced Practice Providers or APPs (Physician Assistants and Nurse Practitioners) who all work together to provide you with the care you need, when you need it.  Your next appointment:   6 month(s)  Provider:   Emeline Calender, MD  We recommend signing up for the patient portal called MyChart.  Sign up information is provided on this After Visit Summary.  MyChart is used to connect with patients for Virtual Visits (Telemedicine).  Patients are able to view lab/test results, encounter notes, upcoming appointments, etc.  Non-urgent messages can be sent to your provider as well.   To learn more about what you can do with MyChart, go to [email or web address].   Other Instructions

## 2024-03-17 ENCOUNTER — Ambulatory Visit: Payer: Self-pay | Admitting: Physician Assistant

## 2024-03-19 DIAGNOSIS — I509 Heart failure, unspecified: Secondary | ICD-10-CM | POA: Diagnosis not present

## 2024-03-19 DIAGNOSIS — D649 Anemia, unspecified: Secondary | ICD-10-CM | POA: Diagnosis not present

## 2024-03-19 DIAGNOSIS — I480 Paroxysmal atrial fibrillation: Secondary | ICD-10-CM | POA: Diagnosis not present

## 2024-03-19 DIAGNOSIS — I2510 Atherosclerotic heart disease of native coronary artery without angina pectoris: Secondary | ICD-10-CM | POA: Diagnosis not present

## 2024-03-19 DIAGNOSIS — N1832 Chronic kidney disease, stage 3b: Secondary | ICD-10-CM | POA: Diagnosis not present

## 2024-03-19 DIAGNOSIS — E1122 Type 2 diabetes mellitus with diabetic chronic kidney disease: Secondary | ICD-10-CM | POA: Diagnosis not present

## 2024-03-19 DIAGNOSIS — E7800 Pure hypercholesterolemia, unspecified: Secondary | ICD-10-CM | POA: Diagnosis not present

## 2024-03-19 DIAGNOSIS — R531 Weakness: Secondary | ICD-10-CM | POA: Diagnosis not present

## 2024-03-19 DIAGNOSIS — I130 Hypertensive heart and chronic kidney disease with heart failure and stage 1 through stage 4 chronic kidney disease, or unspecified chronic kidney disease: Secondary | ICD-10-CM | POA: Diagnosis not present

## 2024-03-19 DIAGNOSIS — Z7901 Long term (current) use of anticoagulants: Secondary | ICD-10-CM | POA: Diagnosis not present

## 2024-04-02 ENCOUNTER — Encounter (HOSPITAL_COMMUNITY): Payer: Self-pay | Admitting: Nephrology

## 2024-04-02 ENCOUNTER — Telehealth (HOSPITAL_COMMUNITY): Payer: Self-pay

## 2024-04-02 ENCOUNTER — Other Ambulatory Visit (HOSPITAL_COMMUNITY): Payer: Self-pay | Admitting: Nephrology

## 2024-04-02 DIAGNOSIS — R809 Proteinuria, unspecified: Secondary | ICD-10-CM | POA: Diagnosis not present

## 2024-04-02 DIAGNOSIS — G500 Trigeminal neuralgia: Secondary | ICD-10-CM | POA: Diagnosis not present

## 2024-04-02 DIAGNOSIS — I129 Hypertensive chronic kidney disease with stage 1 through stage 4 chronic kidney disease, or unspecified chronic kidney disease: Secondary | ICD-10-CM | POA: Diagnosis not present

## 2024-04-02 DIAGNOSIS — N2581 Secondary hyperparathyroidism of renal origin: Secondary | ICD-10-CM | POA: Diagnosis not present

## 2024-04-02 DIAGNOSIS — N189 Chronic kidney disease, unspecified: Secondary | ICD-10-CM | POA: Diagnosis not present

## 2024-04-02 DIAGNOSIS — N183 Chronic kidney disease, stage 3 unspecified: Secondary | ICD-10-CM | POA: Diagnosis not present

## 2024-04-02 DIAGNOSIS — I5189 Other ill-defined heart diseases: Secondary | ICD-10-CM | POA: Diagnosis not present

## 2024-04-02 DIAGNOSIS — I428 Other cardiomyopathies: Secondary | ICD-10-CM | POA: Diagnosis not present

## 2024-04-02 DIAGNOSIS — I480 Paroxysmal atrial fibrillation: Secondary | ICD-10-CM | POA: Diagnosis not present

## 2024-04-02 DIAGNOSIS — N179 Acute kidney failure, unspecified: Secondary | ICD-10-CM | POA: Diagnosis not present

## 2024-04-02 DIAGNOSIS — E1122 Type 2 diabetes mellitus with diabetic chronic kidney disease: Secondary | ICD-10-CM | POA: Diagnosis not present

## 2024-04-02 DIAGNOSIS — D631 Anemia in chronic kidney disease: Secondary | ICD-10-CM | POA: Diagnosis not present

## 2024-04-02 DIAGNOSIS — Q631 Lobulated, fused and horseshoe kidney: Secondary | ICD-10-CM | POA: Diagnosis not present

## 2024-04-02 DIAGNOSIS — N1832 Chronic kidney disease, stage 3b: Secondary | ICD-10-CM | POA: Diagnosis not present

## 2024-04-02 NOTE — Telephone Encounter (Signed)
 Auth Submission: NO AUTH NEEDED Site of care: Site of care: [facility] Payer: [employer] Medication & CPT/J Code(s) submitted: Feraheme (ferumoxytol) [id number] Diagnosis Code: N18.9/D63.1 Route of submission (phone, fax, portal):  Phone # Fax # Auth type: Buy/Bill HB Units/visits requested: 510mg  x 2 doses Reference number:  Approval from: 04/02/24 to 05/26/24

## 2024-04-04 LAB — LAB REPORT - SCANNED: EGFR: 26

## 2024-04-09 ENCOUNTER — Ambulatory Visit: Payer: Self-pay | Admitting: Physician Assistant

## 2024-04-13 DIAGNOSIS — E7800 Pure hypercholesterolemia, unspecified: Secondary | ICD-10-CM | POA: Diagnosis not present

## 2024-04-13 DIAGNOSIS — I130 Hypertensive heart and chronic kidney disease with heart failure and stage 1 through stage 4 chronic kidney disease, or unspecified chronic kidney disease: Secondary | ICD-10-CM | POA: Diagnosis not present

## 2024-04-13 DIAGNOSIS — N401 Enlarged prostate with lower urinary tract symptoms: Secondary | ICD-10-CM | POA: Diagnosis not present

## 2024-04-13 DIAGNOSIS — E11628 Type 2 diabetes mellitus with other skin complications: Secondary | ICD-10-CM | POA: Diagnosis not present

## 2024-04-13 DIAGNOSIS — F411 Generalized anxiety disorder: Secondary | ICD-10-CM | POA: Diagnosis not present

## 2024-04-13 DIAGNOSIS — Z7901 Long term (current) use of anticoagulants: Secondary | ICD-10-CM | POA: Diagnosis not present

## 2024-04-13 DIAGNOSIS — I480 Paroxysmal atrial fibrillation: Secondary | ICD-10-CM | POA: Diagnosis not present

## 2024-04-13 DIAGNOSIS — I509 Heart failure, unspecified: Secondary | ICD-10-CM | POA: Diagnosis not present

## 2024-04-13 DIAGNOSIS — Z794 Long term (current) use of insulin: Secondary | ICD-10-CM | POA: Diagnosis not present

## 2024-04-13 DIAGNOSIS — E1122 Type 2 diabetes mellitus with diabetic chronic kidney disease: Secondary | ICD-10-CM | POA: Diagnosis not present

## 2024-04-13 DIAGNOSIS — N1832 Chronic kidney disease, stage 3b: Secondary | ICD-10-CM | POA: Diagnosis not present

## 2024-04-13 DIAGNOSIS — I2510 Atherosclerotic heart disease of native coronary artery without angina pectoris: Secondary | ICD-10-CM | POA: Diagnosis not present

## 2024-04-13 NOTE — Telephone Encounter (Signed)
 Spoke with pt regarding lab results. Pt verbalized understanding. Pt stated he seen his nephrologist last wee, but he doesn't remember discussing his renal function. Pt was advised to call his nephrologist to schedule an appt to discuss further. Pt agreed. Pt stated he has not had any bleeds that he knows of and has an appt for an iron infusion tomorrow. Pt was advised to call our office if he has any questions.

## 2024-04-14 ENCOUNTER — Ambulatory Visit (HOSPITAL_COMMUNITY)
Admission: RE | Admit: 2024-04-14 | Discharge: 2024-04-14 | Disposition: A | Discharge status: Home / Self Care | Source: Ambulatory Visit | Attending: Internal Medicine | Admitting: Internal Medicine

## 2024-04-14 VITALS — BP 124/50 | HR 85 | Temp 97.6°F | Resp 16

## 2024-04-14 DIAGNOSIS — D631 Anemia in chronic kidney disease: Secondary | ICD-10-CM | POA: Insufficient documentation

## 2024-04-14 DIAGNOSIS — N189 Chronic kidney disease, unspecified: Secondary | ICD-10-CM | POA: Diagnosis not present

## 2024-04-14 MED ORDER — SODIUM CHLORIDE 0.9 % IV SOLN: INTRAVENOUS | Status: DC | PRN

## 2024-04-14 MED ORDER — SODIUM CHLORIDE 0.9 % IV SOLN
510.0000 mg | Freq: Once | INTRAVENOUS | Status: AC
  Administered 2024-04-14: 510 mg via INTRAVENOUS
  Filled 2024-04-14: qty 17

## 2024-04-14 NOTE — Progress Notes (Signed)
 PATIENT CARE CENTER NOTE:  Diagnosis: anemia of chronic renal failure, unspecified CKD stage  Provider: Ephriam Stank MD  Procedure: Feraheme 510mg  infusion   Patient received IV Feraheme ( dose #1 of 2). No premeds required per orders. Observed for at least 30 minutes post infusion. Tolerated well, vitals stable, discharge instructions given, pt to RTC on 11/26 for next infusion , verbalized understanding. Patient alert, oriented, and ambulatory at the time of discharge; however while walking to lobby pt stated he feels lightheaded. Pt placed in wheelchair, provided water, and VS taken. Pt states he did not eat breakfast today, declined snack offered during infusion, pt states he occasionally gets lightheaded at home with standing and unsure if he took his BP medication today. Pt observed approximately 5 minutes, pt states dizziness is resolving and he feels back to his baseline, VSS. Pt taken in wheelchair by staff to meet stepson at vehicle.

## 2024-04-21 ENCOUNTER — Ambulatory Visit (HOSPITAL_COMMUNITY)
Admission: RE | Admit: 2024-04-21 | Discharge: 2024-04-21 | Disposition: A | Discharge status: Home / Self Care | Source: Ambulatory Visit | Attending: Internal Medicine | Admitting: Internal Medicine

## 2024-04-21 VITALS — BP 133/48 | HR 94 | Temp 98.7°F | Resp 18

## 2024-04-21 DIAGNOSIS — N189 Chronic kidney disease, unspecified: Secondary | ICD-10-CM | POA: Diagnosis not present

## 2024-04-21 DIAGNOSIS — D631 Anemia in chronic kidney disease: Secondary | ICD-10-CM

## 2024-04-21 MED ORDER — SODIUM CHLORIDE 0.9 % IV SOLN: INTRAVENOUS | Status: DC | PRN

## 2024-04-21 MED ORDER — SODIUM CHLORIDE 0.9 % IV SOLN
510.0000 mg | Freq: Once | INTRAVENOUS | Status: AC
  Administered 2024-04-21: 510 mg via INTRAVENOUS
  Filled 2024-04-21: qty 17

## 2024-04-21 NOTE — Progress Notes (Signed)
 PATIENT CARE CENTER NOTE:  Diagnosis: anemia of chronic renal failure; unspecified CKD stage  Provider: Ephriam Stank MD  Procedure: Feraheme 510mg  infusion    Patient received IV Feraheme ( dose #2 of 2). No premeds required per orders.  Observed for at least 30 minutes post infusion. Tolerated well, no adverse reaction noted, vitals stable, discharge instructions given , verbalized understanding. Patient alert, oriented, and ambulatory with personal walker at the time of discharge; however taken in wheelchair to meet stepson at vehicle.

## 2024-04-23 DIAGNOSIS — R000 Tachycardia, unspecified: Secondary | ICD-10-CM | POA: Diagnosis not present

## 2024-04-23 DIAGNOSIS — I469 Cardiac arrest, cause unspecified: Secondary | ICD-10-CM | POA: Diagnosis not present

## 2024-04-26 DEATH — deceased

## 2025-02-09 ENCOUNTER — Ambulatory Visit: Admitting: Neurology
